# Patient Record
Sex: Female | Born: 1950 | Race: White | Hispanic: No | Marital: Single | Smoking: Former smoker
Health system: Southern US, Community
[De-identification: ages and names within clinical notes are randomized; demographics above are authoritative.]

## PROBLEM LIST (undated history)

## (undated) DIAGNOSIS — J301 Allergic rhinitis due to pollen: Secondary | ICD-10-CM

## (undated) DIAGNOSIS — M2550 Pain in unspecified joint: Secondary | ICD-10-CM

## (undated) DIAGNOSIS — R609 Edema, unspecified: Secondary | ICD-10-CM

## (undated) DIAGNOSIS — M1990 Unspecified osteoarthritis, unspecified site: Secondary | ICD-10-CM

## (undated) DIAGNOSIS — R682 Dry mouth, unspecified: Secondary | ICD-10-CM

## (undated) DIAGNOSIS — M7989 Other specified soft tissue disorders: Secondary | ICD-10-CM

## (undated) DIAGNOSIS — E538 Deficiency of other specified B group vitamins: Secondary | ICD-10-CM

## (undated) DIAGNOSIS — R252 Cramp and spasm: Secondary | ICD-10-CM

## (undated) DIAGNOSIS — I10 Essential (primary) hypertension: Secondary | ICD-10-CM

## (undated) DIAGNOSIS — I82409 Acute embolism and thrombosis of unspecified deep veins of unspecified lower extremity: Secondary | ICD-10-CM

## (undated) DIAGNOSIS — E119 Type 2 diabetes mellitus without complications: Secondary | ICD-10-CM

## (undated) DIAGNOSIS — H43399 Other vitreous opacities, unspecified eye: Secondary | ICD-10-CM

## (undated) DIAGNOSIS — R233 Spontaneous ecchymoses: Secondary | ICD-10-CM

## (undated) DIAGNOSIS — G8929 Other chronic pain: Secondary | ICD-10-CM

## (undated) DIAGNOSIS — F32A Depression, unspecified: Secondary | ICD-10-CM

## (undated) DIAGNOSIS — M545 Low back pain, unspecified: Secondary | ICD-10-CM

## (undated) DIAGNOSIS — Z9889 Other specified postprocedural states: Secondary | ICD-10-CM

## (undated) DIAGNOSIS — R600 Localized edema: Secondary | ICD-10-CM

## (undated) DIAGNOSIS — I639 Cerebral infarction, unspecified: Secondary | ICD-10-CM

## (undated) DIAGNOSIS — G4730 Sleep apnea, unspecified: Secondary | ICD-10-CM

## (undated) DIAGNOSIS — R531 Weakness: Secondary | ICD-10-CM

## (undated) DIAGNOSIS — R51 Headache: Secondary | ICD-10-CM

## (undated) DIAGNOSIS — F329 Major depressive disorder, single episode, unspecified: Secondary | ICD-10-CM

## (undated) DIAGNOSIS — R209 Unspecified disturbances of skin sensation: Secondary | ICD-10-CM

## (undated) DIAGNOSIS — R238 Other skin changes: Secondary | ICD-10-CM

## (undated) DIAGNOSIS — H269 Unspecified cataract: Secondary | ICD-10-CM

## (undated) DIAGNOSIS — M479 Spondylosis, unspecified: Secondary | ICD-10-CM

## (undated) DIAGNOSIS — G459 Transient cerebral ischemic attack, unspecified: Secondary | ICD-10-CM

## (undated) DIAGNOSIS — J189 Pneumonia, unspecified organism: Secondary | ICD-10-CM

## (undated) DIAGNOSIS — C649 Malignant neoplasm of unspecified kidney, except renal pelvis: Secondary | ICD-10-CM

## (undated) DIAGNOSIS — G629 Polyneuropathy, unspecified: Secondary | ICD-10-CM

## (undated) DIAGNOSIS — R011 Cardiac murmur, unspecified: Secondary | ICD-10-CM

## (undated) DIAGNOSIS — G479 Sleep disorder, unspecified: Secondary | ICD-10-CM

## (undated) DIAGNOSIS — H3530 Unspecified macular degeneration: Secondary | ICD-10-CM

## (undated) DIAGNOSIS — E236 Other disorders of pituitary gland: Secondary | ICD-10-CM

## (undated) DIAGNOSIS — M549 Dorsalgia, unspecified: Secondary | ICD-10-CM

## (undated) DIAGNOSIS — K219 Gastro-esophageal reflux disease without esophagitis: Secondary | ICD-10-CM

## (undated) DIAGNOSIS — R002 Palpitations: Secondary | ICD-10-CM

## (undated) DIAGNOSIS — E785 Hyperlipidemia, unspecified: Secondary | ICD-10-CM

## (undated) DIAGNOSIS — R040 Epistaxis: Secondary | ICD-10-CM

## (undated) DIAGNOSIS — R32 Unspecified urinary incontinence: Secondary | ICD-10-CM

## (undated) DIAGNOSIS — D649 Anemia, unspecified: Secondary | ICD-10-CM

## (undated) DIAGNOSIS — I253 Aneurysm of heart: Secondary | ICD-10-CM

## (undated) DIAGNOSIS — R519 Headache, unspecified: Secondary | ICD-10-CM

## (undated) HISTORY — DX: Other specified postprocedural states: Z98.890

## (undated) HISTORY — DX: Pneumonia, unspecified organism: J18.9

## (undated) HISTORY — DX: Allergic rhinitis due to pollen: J30.1

## (undated) HISTORY — PX: BRACHIOPLASTY: SUR162

## (undated) HISTORY — DX: Acute embolism and thrombosis of unspecified deep veins of unspecified lower extremity: I82.409

## (undated) HISTORY — DX: Epistaxis: R04.0

## (undated) HISTORY — PX: FACIAL COSMETIC SURGERY: SHX629

## (undated) HISTORY — PX: CATARACT EXTRACTION W/ INTRAOCULAR LENS  IMPLANT, BILATERAL: SHX1307

## (undated) HISTORY — PX: ESOPHAGOGASTRODUODENOSCOPY: SHX1529

## (undated) HISTORY — DX: Unspecified cataract: H26.9

## (undated) HISTORY — DX: Depression, unspecified: F32.A

## (undated) HISTORY — DX: Type 2 diabetes mellitus without complications: E11.9

## (undated) HISTORY — DX: Palpitations: R00.2

## (undated) HISTORY — DX: Malignant neoplasm of unspecified kidney, except renal pelvis: C64.9

## (undated) HISTORY — DX: Sleep disorder, unspecified: G47.9

## (undated) HISTORY — PX: JOINT REPLACEMENT: SHX530

## (undated) HISTORY — DX: Anemia, unspecified: D64.9

## (undated) HISTORY — PX: VAGINAL HYSTERECTOMY: SUR661

## (undated) HISTORY — DX: Other skin changes: R23.8

## (undated) HISTORY — DX: Other vitreous opacities, unspecified eye: H43.399

## (undated) HISTORY — DX: Weakness: R53.1

## (undated) HISTORY — DX: Aneurysm of heart: I25.3

## (undated) HISTORY — PX: ABDOMINOPLASTY: SUR9

## (undated) HISTORY — DX: Dry mouth, unspecified: R68.2

## (undated) HISTORY — DX: Major depressive disorder, single episode, unspecified: F32.9

## (undated) HISTORY — DX: Deficiency of other specified B group vitamins: E53.8

## (undated) HISTORY — DX: Other disorders of pituitary gland: E23.6

## (undated) HISTORY — DX: Polyneuropathy, unspecified: G62.9

## (undated) HISTORY — DX: Other specified soft tissue disorders: M79.89

## (undated) HISTORY — DX: Cerebral infarction, unspecified: I63.9

## (undated) HISTORY — PX: MASTOPEXY: SUR857

## (undated) HISTORY — DX: Dorsalgia, unspecified: M54.9

## (undated) HISTORY — PX: COLONOSCOPY: SHX174

## (undated) HISTORY — DX: Cramp and spasm: R25.2

## (undated) HISTORY — DX: Unspecified disturbances of skin sensation: R20.9

## (undated) HISTORY — DX: Spontaneous ecchymoses: R23.3

---
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
## 1998-02-06 ENCOUNTER — Ambulatory Visit (HOSPITAL_COMMUNITY): Admission: RE | Admit: 1998-02-06 | Discharge: 1998-02-06 | Payer: Self-pay | Admitting: Internal Medicine

## 1998-07-08 ENCOUNTER — Encounter: Admission: RE | Admit: 1998-07-08 | Discharge: 1998-10-06 | Payer: Self-pay | Admitting: Internal Medicine

## 1999-08-19 ENCOUNTER — Ambulatory Visit (HOSPITAL_COMMUNITY): Admission: RE | Admit: 1999-08-19 | Discharge: 1999-08-19 | Payer: Self-pay | Admitting: Internal Medicine

## 1999-08-19 ENCOUNTER — Encounter: Payer: Self-pay | Admitting: Internal Medicine

## 1999-09-22 HISTORY — PX: TOTAL KNEE ARTHROPLASTY: SHX125

## 1999-12-29 ENCOUNTER — Ambulatory Visit (HOSPITAL_COMMUNITY): Admission: RE | Admit: 1999-12-29 | Discharge: 1999-12-29 | Payer: Self-pay | Admitting: *Deleted

## 1999-12-29 ENCOUNTER — Encounter: Payer: Self-pay | Admitting: *Deleted

## 2000-01-01 ENCOUNTER — Encounter: Payer: Self-pay | Admitting: Orthopedic Surgery

## 2000-01-05 ENCOUNTER — Inpatient Hospital Stay (HOSPITAL_COMMUNITY): Admission: RE | Admit: 2000-01-05 | Discharge: 2000-01-09 | Payer: Self-pay | Admitting: Orthopedic Surgery

## 2000-01-09 ENCOUNTER — Inpatient Hospital Stay (HOSPITAL_COMMUNITY)
Admission: RE | Admit: 2000-01-09 | Discharge: 2000-01-13 | Payer: Self-pay | Admitting: Physical Medicine & Rehabilitation

## 2001-05-06 ENCOUNTER — Ambulatory Visit (HOSPITAL_COMMUNITY): Admission: RE | Admit: 2001-05-06 | Discharge: 2001-05-06 | Payer: Self-pay | Admitting: Internal Medicine

## 2001-05-06 ENCOUNTER — Encounter: Payer: Self-pay | Admitting: Internal Medicine

## 2001-05-11 ENCOUNTER — Encounter: Payer: Self-pay | Admitting: Internal Medicine

## 2001-05-11 ENCOUNTER — Encounter: Admission: RE | Admit: 2001-05-11 | Discharge: 2001-05-11 | Payer: Self-pay | Admitting: Internal Medicine

## 2005-06-05 ENCOUNTER — Ambulatory Visit (HOSPITAL_COMMUNITY): Admission: RE | Admit: 2005-06-05 | Discharge: 2005-06-05 | Payer: Self-pay | Admitting: Family Medicine

## 2005-06-16 ENCOUNTER — Encounter: Admission: RE | Admit: 2005-06-16 | Discharge: 2005-06-16 | Payer: Self-pay | Admitting: Family Medicine

## 2006-06-25 ENCOUNTER — Ambulatory Visit (HOSPITAL_COMMUNITY): Admission: RE | Admit: 2006-06-25 | Discharge: 2006-06-25 | Payer: Self-pay | Admitting: Family Medicine

## 2006-09-21 HISTORY — PX: TOTAL HIP ARTHROPLASTY: SHX124

## 2007-01-30 ENCOUNTER — Encounter: Admission: RE | Admit: 2007-01-30 | Discharge: 2007-01-30 | Payer: Self-pay | Admitting: Sports Medicine

## 2007-02-10 ENCOUNTER — Encounter: Admission: RE | Admit: 2007-02-10 | Discharge: 2007-02-10 | Payer: Self-pay | Admitting: Sports Medicine

## 2007-04-25 ENCOUNTER — Inpatient Hospital Stay (HOSPITAL_COMMUNITY): Admission: RE | Admit: 2007-04-25 | Discharge: 2007-04-29 | Payer: Self-pay | Admitting: Orthopedic Surgery

## 2007-08-01 ENCOUNTER — Ambulatory Visit (HOSPITAL_COMMUNITY): Admission: RE | Admit: 2007-08-01 | Discharge: 2007-08-01 | Payer: Self-pay | Admitting: Family Medicine

## 2009-09-27 ENCOUNTER — Encounter: Admission: RE | Admit: 2009-09-27 | Discharge: 2009-09-27 | Payer: Self-pay | Admitting: Family Medicine

## 2009-10-25 ENCOUNTER — Ambulatory Visit (HOSPITAL_BASED_OUTPATIENT_CLINIC_OR_DEPARTMENT_OTHER): Admission: RE | Admit: 2009-10-25 | Discharge: 2009-10-25 | Payer: Self-pay | Admitting: Family Medicine

## 2009-10-27 ENCOUNTER — Ambulatory Visit: Payer: Self-pay | Admitting: Internal Medicine

## 2009-11-01 ENCOUNTER — Ambulatory Visit (HOSPITAL_COMMUNITY): Admission: RE | Admit: 2009-11-01 | Discharge: 2009-11-01 | Payer: Self-pay | Admitting: Family Medicine

## 2009-11-21 ENCOUNTER — Ambulatory Visit (HOSPITAL_BASED_OUTPATIENT_CLINIC_OR_DEPARTMENT_OTHER): Admission: RE | Admit: 2009-11-21 | Discharge: 2009-11-21 | Payer: Self-pay | Admitting: Family Medicine

## 2009-12-02 ENCOUNTER — Encounter: Admission: RE | Admit: 2009-12-02 | Discharge: 2009-12-02 | Payer: Self-pay | Admitting: Family Medicine

## 2010-03-14 ENCOUNTER — Encounter: Payer: Self-pay | Admitting: Emergency Medicine

## 2010-03-14 ENCOUNTER — Inpatient Hospital Stay (HOSPITAL_COMMUNITY): Admission: AD | Admit: 2010-03-14 | Discharge: 2010-03-15 | Payer: Self-pay | Admitting: Internal Medicine

## 2010-03-14 ENCOUNTER — Ambulatory Visit: Payer: Self-pay | Admitting: Radiology

## 2010-03-14 ENCOUNTER — Ambulatory Visit: Payer: Self-pay | Admitting: Internal Medicine

## 2010-03-17 ENCOUNTER — Telehealth (INDEPENDENT_AMBULATORY_CARE_PROVIDER_SITE_OTHER): Payer: Self-pay | Admitting: *Deleted

## 2010-03-17 ENCOUNTER — Encounter: Payer: Self-pay | Admitting: Cardiovascular Disease

## 2010-03-17 ENCOUNTER — Ambulatory Visit: Payer: Self-pay | Admitting: Internal Medicine

## 2010-03-17 LAB — CONVERTED CEMR LAB
BUN: 18 mg/dL (ref 6–23)
Basophils Relative: 0.7 % (ref 0.0–3.0)
Chloride: 99 meq/L (ref 96–112)
Eosinophils Relative: 2.2 % (ref 0.0–5.0)
HCT: 34.9 % — ABNORMAL LOW (ref 36.0–46.0)
Hemoglobin: 12.1 g/dL (ref 12.0–15.0)
Lymphs Abs: 2.6 10*3/uL (ref 0.7–4.0)
MCV: 81.8 fL (ref 78.0–100.0)
Monocytes Absolute: 0.7 10*3/uL (ref 0.1–1.0)
Neutro Abs: 7.6 10*3/uL (ref 1.4–7.7)
Potassium: 4.2 meq/L (ref 3.5–5.1)
RBC: 4.27 M/uL (ref 3.87–5.11)
WBC: 11.2 10*3/uL — ABNORMAL HIGH (ref 4.5–10.5)

## 2010-03-20 ENCOUNTER — Ambulatory Visit: Payer: Self-pay | Admitting: Cardiovascular Disease

## 2010-03-20 ENCOUNTER — Inpatient Hospital Stay (HOSPITAL_BASED_OUTPATIENT_CLINIC_OR_DEPARTMENT_OTHER): Admission: RE | Admit: 2010-03-20 | Discharge: 2010-03-20 | Payer: Self-pay | Admitting: Internal Medicine

## 2010-05-06 ENCOUNTER — Ambulatory Visit (HOSPITAL_COMMUNITY): Admission: RE | Admit: 2010-05-06 | Discharge: 2010-05-06 | Payer: Self-pay | Admitting: Orthopedic Surgery

## 2010-07-15 ENCOUNTER — Ambulatory Visit (HOSPITAL_COMMUNITY): Admission: RE | Admit: 2010-07-15 | Discharge: 2010-07-15 | Payer: Self-pay | Admitting: General Surgery

## 2010-07-18 ENCOUNTER — Ambulatory Visit (HOSPITAL_COMMUNITY): Admission: RE | Admit: 2010-07-18 | Discharge: 2010-07-18 | Payer: Self-pay | Admitting: General Surgery

## 2010-07-30 ENCOUNTER — Encounter
Admission: RE | Admit: 2010-07-30 | Discharge: 2010-07-30 | Payer: Self-pay | Source: Home / Self Care | Attending: General Surgery | Admitting: General Surgery

## 2010-10-12 ENCOUNTER — Encounter: Payer: Self-pay | Admitting: Family Medicine

## 2010-10-23 NOTE — Letter (Signed)
Summary: Cardiac Catheterization Instructions- [place] [place]  [address] [place]   [place], [place] [address]   Phone: [phone]  Fax: [phone]     03/17/2010 MRN: [id number]  [name] [name], [place]  [address]  Dear [name] [name],   You are scheduled for a Cardiac Catheterization on __6/30/11_______ with Dr.___Cooper___________  Please arrive to the 1st floor of the [facility] at [facility] at _6:30____ am / pm on the day of your procedure. Please do not arrive before 6:30 a.m. Call the [facility] at [phone] if you are unable to make your appointmnet. The Code to get into the parking garage under the building is_0100_______. Take the elevators to the 1st floor. You must have someone to drive you home. Someone must be with you for the first 24 hours after you arrive home. Please wear clothes that are easy to get on and off and wear slip-on shoes. Do not eat or drink after midnight except water with your medications that morning. Bring all your medications and current insurance cards with you.  _x__ DO NOT take these medications before your procedure: _____hold metformin 24 hours before cath and 48 hours after and hold glipizide am of cath___________________________________________________________  _x__ Make sure you take your aspirin.  _x__ You may take ALL of your medications with water that morning. ________________________________________________________________________________________________________________________________  ___ DO NOT take ANY medications before your procedure.  ___ Pre-med instructions:  ________________________________________________________________________________________________________________________________  The usual length of stay after your procedure is 2 to 3 hours. This can vary.  If you have any questions, please call the office at the number  listed above.  DR KLEIN/ Devra Dopp, LPN

## 2010-10-23 NOTE — Progress Notes (Signed)
Summary: need cath please give info to triage nurse  Phone Note Call from Patient Call back at Home Phone [phone]   Caller: Patient Reason for Call: Talk to Nurse Summary of Call: Per after hour voice mail by Dr. Caryl Comes, pt need a catyh please give iinfo to the triage nurse and patient was told if she doesn't hear from [place] by 12 noon to call. Message send to message nurse as urgent message.  Initial call taken by: Neil Crouch,  March 17, 2010 8:23 AM  Follow-up for Phone Call        pt scheduled for l heart cath on thur 03/20/10 at 7:30 with dr cooper per dr Caryl Comes. pt having labs done today and will leave instructions at front desk for pick up.pt aware. Follow-up by: Devra Dopp, LPN,  March 17, 7370 0:62 AM

## 2010-11-06 ENCOUNTER — Encounter: Payer: Federal, State, Local not specified - PPO | Attending: General Surgery

## 2010-11-06 DIAGNOSIS — Z01818 Encounter for other preprocedural examination: Secondary | ICD-10-CM | POA: Insufficient documentation

## 2010-11-06 DIAGNOSIS — Z713 Dietary counseling and surveillance: Secondary | ICD-10-CM | POA: Insufficient documentation

## 2010-11-19 ENCOUNTER — Other Ambulatory Visit: Payer: Self-pay | Admitting: General Surgery

## 2010-11-19 ENCOUNTER — Encounter (HOSPITAL_COMMUNITY): Payer: Federal, State, Local not specified - PPO

## 2010-11-19 LAB — CBC
Hemoglobin: 12.9 g/dL (ref 12.0–15.0)
MCH: 26 pg (ref 26.0–34.0)
MCV: 79.1 fL (ref 78.0–100.0)
RBC: 4.97 MIL/uL (ref 3.87–5.11)

## 2010-11-19 LAB — COMPREHENSIVE METABOLIC PANEL
ALT: 19 U/L (ref 0–35)
CO2: 27 mEq/L (ref 19–32)
Calcium: 9.8 mg/dL (ref 8.4–10.5)
Chloride: 95 mEq/L — ABNORMAL LOW (ref 96–112)
Creatinine, Ser: 1.05 mg/dL (ref 0.4–1.2)
GFR calc non Af Amer: 54 mL/min — ABNORMAL LOW (ref >60)
Glucose, Bld: 135 mg/dL — ABNORMAL HIGH (ref 70–99)
Sodium: 135 mEq/L (ref 135–145)
Total Bilirubin: 0.7 mg/dL (ref 0.3–1.2)

## 2010-11-19 LAB — DIFFERENTIAL
Eosinophils Absolute: 0.2 10*3/uL (ref 0.0–0.7)
Eosinophils Relative: 1 % (ref 0–5)
Lymphocytes Relative: 22 % (ref 12–46)
Lymphs Abs: 3.1 10*3/uL (ref 0.7–4.0)
Monocytes Relative: 5 % (ref 3–12)

## 2010-11-19 LAB — SURGICAL PCR SCREEN: Staphylococcus aureus: NEGATIVE

## 2010-11-24 ENCOUNTER — Inpatient Hospital Stay (HOSPITAL_COMMUNITY)
Admission: RE | Admit: 2010-11-24 | Discharge: 2010-11-27 | DRG: 288 | Disposition: A | Discharge status: Home / Self Care | Payer: Federal, State, Local not specified - PPO | Source: Ambulatory Visit | Attending: General Surgery | Admitting: General Surgery

## 2010-11-24 DIAGNOSIS — E119 Type 2 diabetes mellitus without complications: Secondary | ICD-10-CM | POA: Diagnosis present

## 2010-11-24 DIAGNOSIS — G4733 Obstructive sleep apnea (adult) (pediatric): Secondary | ICD-10-CM | POA: Diagnosis present

## 2010-11-24 DIAGNOSIS — K420 Umbilical hernia with obstruction, without gangrene: Secondary | ICD-10-CM | POA: Diagnosis present

## 2010-11-24 DIAGNOSIS — E7800 Pure hypercholesterolemia, unspecified: Secondary | ICD-10-CM | POA: Diagnosis present

## 2010-11-24 DIAGNOSIS — Z6841 Body Mass Index (BMI) 40.0 and over, adult: Secondary | ICD-10-CM

## 2010-11-24 DIAGNOSIS — Z01812 Encounter for preprocedural laboratory examination: Secondary | ICD-10-CM

## 2010-11-24 DIAGNOSIS — M1990 Unspecified osteoarthritis, unspecified site: Secondary | ICD-10-CM | POA: Diagnosis present

## 2010-11-24 DIAGNOSIS — I10 Essential (primary) hypertension: Secondary | ICD-10-CM | POA: Diagnosis present

## 2010-11-24 DIAGNOSIS — N393 Stress incontinence (female) (male): Secondary | ICD-10-CM | POA: Diagnosis present

## 2010-11-24 HISTORY — PX: ROUX-EN-Y GASTRIC BYPASS: SHX1104

## 2010-11-24 LAB — GLUCOSE, CAPILLARY
Glucose-Capillary: 130 mg/dL — ABNORMAL HIGH (ref 70–99)
Glucose-Capillary: 173 mg/dL — ABNORMAL HIGH (ref 70–99)
Glucose-Capillary: 232 mg/dL — ABNORMAL HIGH (ref 70–99)

## 2010-11-24 LAB — HEMOGLOBIN AND HEMATOCRIT, BLOOD
HCT: 33.1 % — ABNORMAL LOW (ref 36.0–46.0)
Hemoglobin: 10.1 g/dL — ABNORMAL LOW (ref 12.0–15.0)

## 2010-11-25 ENCOUNTER — Inpatient Hospital Stay (HOSPITAL_COMMUNITY): Payer: Federal, State, Local not specified - PPO

## 2010-11-25 DIAGNOSIS — Z09 Encounter for follow-up examination after completed treatment for conditions other than malignant neoplasm: Secondary | ICD-10-CM

## 2010-11-25 LAB — DIFFERENTIAL
Basophils Relative: 0 % (ref 0–1)
Eosinophils Absolute: 0 10*3/uL (ref 0.0–0.7)
Lymphs Abs: 0.9 10*3/uL (ref 0.7–4.0)
Neutro Abs: 11.1 10*3/uL — ABNORMAL HIGH (ref 1.7–7.7)
Neutrophils Relative %: 86 % — ABNORMAL HIGH (ref 43–77)

## 2010-11-25 LAB — GLUCOSE, CAPILLARY
Glucose-Capillary: 165 mg/dL — ABNORMAL HIGH (ref 70–99)
Glucose-Capillary: 162 mg/dL — ABNORMAL HIGH (ref 70–99)
Glucose-Capillary: 151 mg/dL — ABNORMAL HIGH (ref 70–99)

## 2010-11-25 LAB — HEMOGLOBIN AND HEMATOCRIT, BLOOD
HCT: 34.5 % — ABNORMAL LOW (ref 36.0–46.0)
Hemoglobin: 10.7 g/dL — ABNORMAL LOW (ref 12.0–15.0)

## 2010-11-25 LAB — CBC
Hemoglobin: 11.1 g/dL — ABNORMAL LOW (ref 12.0–15.0)
Platelets: 241 10*3/uL (ref 150–400)
RBC: 4.27 MIL/uL (ref 3.87–5.11)
WBC: 12.8 10*3/uL — ABNORMAL HIGH (ref 4.0–10.5)

## 2010-11-26 LAB — CBC
HCT: 34.5 % — ABNORMAL LOW (ref 36.0–46.0)
Hemoglobin: 10.6 g/dL — ABNORMAL LOW (ref 12.0–15.0)
MCV: 83.9 fL (ref 78.0–100.0)
Platelets: 223 10*3/uL (ref 150–400)
RBC: 4.11 MIL/uL (ref 3.87–5.11)
WBC: 13.2 10*3/uL — ABNORMAL HIGH (ref 4.0–10.5)

## 2010-11-26 LAB — GLUCOSE, CAPILLARY
Glucose-Capillary: 132 mg/dL — ABNORMAL HIGH (ref 70–99)
Glucose-Capillary: 116 mg/dL — ABNORMAL HIGH (ref 70–99)

## 2010-11-26 LAB — DIFFERENTIAL
Lymphocytes Relative: 7 % — ABNORMAL LOW (ref 12–46)
Lymphs Abs: 1 10*3/uL (ref 0.7–4.0)
Neutro Abs: 11.4 10*3/uL — ABNORMAL HIGH (ref 1.7–7.7)
Neutrophils Relative %: 86 % — ABNORMAL HIGH (ref 43–77)

## 2010-11-28 ENCOUNTER — Other Ambulatory Visit (HOSPITAL_COMMUNITY): Payer: Self-pay | Admitting: Family Medicine

## 2010-11-28 DIAGNOSIS — Z1231 Encounter for screening mammogram for malignant neoplasm of breast: Secondary | ICD-10-CM

## 2010-11-30 NOTE — Discharge Summary (Signed)
[name], [name]              ACCOUNT NO.:  [id number]  MEDICAL RECORD NO.:  [id number]           PATIENT TYPE:  I  LOCATION:  1526                         FACILITY:  [facility]  PHYSICIAN:  Leighton Ruff. Redmond Pulling, MD     DATE OF BIRTH:  Apr 19, 1951  DATE OF ADMISSION:  11/24/2010 DATE OF DISCHARGE:  11/27/2010                              DISCHARGE SUMMARY   ADMITTING DIAGNOSES: 1. Morbid obesity. 2. Umbilical hernia. 3. Hypertension. 4. Diabetes mellitus type 2. 5. Hypercholesterolemia. 6. Osteoarthritis. 7. Carpal tunnel syndrome.  DISCHARGE DIAGNOSES: 1. Morbid obesity. 2. Umbilical hernia. 3. Hypertension. 4. Diabetes mellitus type 2. 5. Hypercholesterolemia. 6. Osteoarthritis. 7. Carpal tunnel syndrome.  PROCEDURES:  November 24, 2010, laparoscopic Roux-en- Y gastric bypass, umbilical hernia repair by Dr. Excell Seltzer.  BRIEF HOSPITAL COURSE:  The patient was admitted to the hospital on March 5 and underwent the above-mentioned procedure.  Her postoperative course was unremarkable.  Please see operative report for further details.  On postoperative day #1, she underwent upper GI which demonstrated patency of the gastrojejunal anastomosis as well as no leak.  She was therefore started on ice chips and water.  She also had a duplex ultrasound of her lower extremities which demonstrated no evidence of deep venous thrombosis.  She was maintained on chemical DVT prophylaxis consisting of subcutaneous heparin.  She was not given any of her antihypertensive medications while in the hospital.  Her blood sugars were monitored.  Her blood sugars ranged from the low 100s to the 140s.  She did require some supplemental insulin.  Her hemoglobin remained stable.  On postoperative day #3, she was tolerating bariatric shakes.  Her vital signs were stable.  Her wounds were clean, dry and intact.  She was deemed stable for discharge.  DISCHARGE MEDICATIONS: 1. Roxicet elixir 5 to 10 mL every 4  hours as needed for pain. 2. Amitriptyline 10 mg q.p.m.  I stopped all her home medications including her metformin, glipizide, benazepril/HCTZ and Lasix for now.  DISCHARGE PLAN:  She is going to follow up with one of our nurses in the office in about 10 days for staple removal.  She will see Dr. Excell Seltzer on March 22.  She is going to see Cherre Blanc, our nutritionist, on March 20.  I have also recommended that she follow with her primary care physician, Dr. Brion Aliment, within 1 to 2 weeks to monitor her blood pressure and her diabetes and to determine whether or not she needs to have medication reinstituted.  DISCHARGE INSTRUCTIONS:  The patient has been instructed to keep up journal of her blood sugars and to check them about once or twice a day. She has been instructed to call her primary care doctor should her blood sugars get above 200.  She was given discharge instructions regarding gastric bypass diet.  She can shower.  She is not to do any lifting for 4 weeks and she is to increase her activity slowly.Leighton Ruff. Redmond Pulling, MD     EMW/MEDQ  D:  11/27/2010  T:  11/27/2010  Job:  [id number]  cc:   Brion Aliment, M.D.  Amy  Boykin Peek. [facility]  Electronically Signed by Greer Pickerel M.D. on 11/30/2010 09:45:43 AM

## 2010-12-02 NOTE — Op Note (Signed)
NAMEMARYAN, [name]              ACCOUNT NO.:  [id number]  MEDICAL RECORD NO.:  [id number]           PATIENT TYPE:  I  LOCATION:  0004                         FACILITY:  [facility]  PHYSICIAN:  [name] Kitchen T. Lourdez [name], M.D.DATE OF BIRTH:  15-Sep-1951  DATE OF PROCEDURE:  11/24/2010 DATE OF DISCHARGE:                              OPERATIVE REPORT   PREOPERATIVE DIAGNOSES: 1. Morbid obesity. 2. Umbilical hernia.  POSTOPERATIVE DIAGNOSES: 1. Morbid obesity. 2. Umbilical hernia.  SURGICAL PROCEDURES: 1. Laparoscopic Roux-en-Y gastric bypass. 2. Umbilical hernia repair.  SURGEON:  [name] [name], M.D.  ASSISTANT:  [name] Caprice. Hassell Done, MD  ANESTHESIA:  General.  BRIEF HISTORY:  [name] [name] is a 60 year old female who presents with progressive morbid obesity unresponsive to multiple attempts at medical management.  She presents at 5 feet 4, 313 pounds, BMI 53.5.  She has multiple comorbidities including adult-onset diabetes mellitus, obstructive sleep apnea, hypertension, hypercholesterolemia, arthritis, and stress urinary incontinence.  After extensive preoperative discussion and workup detailed elsewhere, we have elected to proceed with laparoscopic Roux-en-Y gastric bypass for treatment of her morbid obesity.  The patient also has a small umbilical hernia that we discussed would possibly need to be repaired to prevent postoperative small bowel incarceration.  The patient has undergone a clinical bowel prep at home.  DESCRIPTION OF PROCEDURE:  She was brought to the operating room, placed in supine position on the operative table and general orotracheal anesthesia was induced.  She received preoperative IV antibiotics. Heparin 5000 units was given subcutaneously preoperatively.  Foley catheter was placed.  The abdomen was widely sterilely prepped and draped.  Correct patient and procedure were verified.  Access was obtained in the left upper quadrant with an 11-mm  OptiView trocar without difficulty and pneumoperitoneum established.  Under direct vision, a 12-mm trocar was placed laterally in the right upper quadrant and the 12 mm trocar in right upper quadrant midclavicular line and an 11-mm trocar just above and left the umbilicus for camera port. Finally, a 5-mm trocar was placed in the left lateral abdomen.  There was an approximately 1-cm umbilical hernia with some chronically incarcerated omentum and this was reduced from the hernia.  The omentum was elevated, the transverse mesocolon elevated, and the ligament Treitz clearly identified.  A 40-cm biliopancreatic limb was then carefully measured and divided at this point with a single firing of the white load 60-mm stapler.  The mesentery was further mobilized the short distance with a Harmonic scalpel.  The end of the Roux limb was marked by suturing a [name] [name] to it.  Following this, a 100-cm Roux limb was carefully measured.  At this point, a side-to-side anastomosis was created between the biliopancreatic limb and the Roux limb with a single firing of the Endo GIA white load 45-mm stapler.  The staple line was intact without bleeding.  The common enterotomy was closed from either end with running 2-0 Vicryl.  The mesenteric defect was closed with a running 2-0 Vicryl.  Following this, the omentum was divided with Harmonic scalpel just left of the falciform ligament to allow increased mobility of the Roux limb in the  antecolic fashion.  A [name] [name] was then placed through a 5-mm subxiphoid site.  The liver was somewhat fatty and enlarged but not extreme.  [name] [name] was used to elevate the left lobe liver with very good exposure of the hiatus and upper stomach.  The angle of His was initially mobilized dividing the peritoneal attachments with a Harmonic scalpel.  Following this, a 4 to 5 cm gastric pouch was carefully measured along the lesser curve.  The peritoneum here  was incised and dissection was carried towards the retrogastric space and lesser sac with a Harmonic scalpel at right angles to the lesser curve and the lesser sac was able to be entered without difficulty.  Made sure all tubes were removed from the stomach. Initial firing of the gold Echelon staple was performed on the right angles to the lesser curve for about 3 to 4 cm.  Following this, the pouch was created with 2 further firings of the blue load 60-mm stapler up through the previously dissected area at angle of His.  This created a nice tubular pouch of 4 to 5 cm in length.  At this point, the Roux limb was brought up in an antecolic fashion with the end of the Roux limb candy cane facing to the patient's left under no unusual tension. Initial posterior row of running 2-0 Vicryl was placed between the Roux limb of the gastric pouch.  With the [name] [name] passed back down into the stomach, enterotomies were made in the gastric pouch and the Roux limb with Harmonic scalpel and approximately 2.5 cm anastomosis created with a single firing of the blue load 45-mm linear stapler.  Staple line was intact without bleeding.  Common enterotomy was then closed from either end with running 2-0 Vicryl.  The [name] [name] was then passed back down through the anastomosis of the stent and an outer seromuscular layer of running 2-0 Vicryl with an [name] [name] was placed.  The [name] [name] was then removed.  The [name] [name] defect was exposed.  This was closed with a running 2-0 silk between the transverse mesocolon and the Roux limb mesentery.  Then, I felt that we needed to close the umbilical defect to prevent small bowel incarceration.  A small less than 1 cm incision was made just beneath the umbilicus and using the EndoClose device with 0 Prolene sutures, 2 sutures were placed a transversely oriented closing the fascial and peritoneal defect.  At this point, Dr. Hassell [name] went above to perform endoscopy.  With  the outlet of the pouch clamped and under saline irrigation with the pouch tightly distended with air, there was no evidence of leak.  The pouch was desufflated. The abdomen was irrigated and hemostasis assured.  There was noted to be a small capsular tear of the undersurface of left lobe of liver, probably from one of the instruments as liver was quite large.  This was not bleeding.  The suture staple lines of the gastrojejunostomy was repaired with Tisseel tissue sealant.  A piece of Surgicel was placed over the small liver capsular tear and there was no bleeding.  The Nathanson [name] was removed.  Two On-Q catheters were placed on either side of the abdomen for postoperative infusion.  All CO2 was then evacuated.  Trocars were removed.  Skin incisions were closed with staples.  Sponges and instrument counts correct.  The patient was taken to recovery in good condition.     [name] Lamer. Seana Underwood, M.D.     [place]  D:  11/24/2010  T:  11/24/2010  Job:  [id number]  Electronically Signed by Excell Seltzer M.D. on 12/02/2010 07:47:50 PM

## 2010-12-07 LAB — BASIC METABOLIC PANEL
BUN: 18 mg/dL (ref 6–23)
CO2: 30 mEq/L (ref 19–32)
Glucose, Bld: 200 mg/dL — ABNORMAL HIGH (ref 70–99)
Potassium: 3.8 mEq/L (ref 3.5–5.1)
Sodium: 138 mEq/L (ref 135–145)

## 2010-12-07 LAB — CARDIAC PANEL(CRET KIN+CKTOT+MB+TROPI)
CK, MB: 1.6 ng/mL (ref 0.3–4.0)
CK, MB: 1.9 ng/mL (ref 0.3–4.0)
Total CK: 57 U/L (ref 7–177)
Total CK: 58 U/L (ref 7–177)
Troponin I: 0.01 ng/mL (ref 0.00–0.06)
Troponin I: 0.02 ng/mL (ref 0.00–0.06)

## 2010-12-07 LAB — CBC
HCT: 38.7 % (ref 36.0–46.0)
Hemoglobin: 13.1 g/dL (ref 12.0–15.0)
MCH: 27.4 pg (ref 26.0–34.0)
MCHC: 33.8 g/dL (ref 30.0–36.0)
RDW: 13.1 % (ref 11.5–15.5)

## 2010-12-07 LAB — DIFFERENTIAL
Basophils Relative: 2 % — ABNORMAL HIGH (ref 0–1)
Eosinophils Absolute: 0.2 10*3/uL (ref 0.0–0.7)
Eosinophils Relative: 2 % (ref 0–5)
Monocytes Absolute: 0.7 10*3/uL (ref 0.1–1.0)
Monocytes Relative: 7 % (ref 3–12)

## 2010-12-07 LAB — LIPID PANEL
Cholesterol: 180 mg/dL (ref 0–200)
LDL Cholesterol: UNDETERMINED mg/dL (ref 0–99)
Total CHOL/HDL Ratio: 4.7 RATIO
Triglycerides: 429 mg/dL — ABNORMAL HIGH (ref <150)
VLDL: UNDETERMINED mg/dL (ref 0–40)

## 2010-12-07 LAB — HEMOGLOBIN A1C: Mean Plasma Glucose: 174 mg/dL — ABNORMAL HIGH (ref <117)

## 2010-12-07 LAB — GLUCOSE, CAPILLARY
Glucose-Capillary: 206 mg/dL — ABNORMAL HIGH (ref 70–99)
Glucose-Capillary: 331 mg/dL — ABNORMAL HIGH (ref 70–99)

## 2010-12-07 LAB — TSH: TSH: 3.275 u[IU]/mL (ref 0.350–4.500)

## 2010-12-09 ENCOUNTER — Encounter: Payer: Federal, State, Local not specified - PPO | Attending: General Surgery

## 2010-12-09 DIAGNOSIS — Z09 Encounter for follow-up examination after completed treatment for conditions other than malignant neoplasm: Secondary | ICD-10-CM | POA: Insufficient documentation

## 2010-12-09 DIAGNOSIS — Z713 Dietary counseling and surveillance: Secondary | ICD-10-CM | POA: Insufficient documentation

## 2010-12-09 DIAGNOSIS — Z9884 Bariatric surgery status: Secondary | ICD-10-CM | POA: Insufficient documentation

## 2010-12-10 ENCOUNTER — Ambulatory Visit (HOSPITAL_COMMUNITY)
Admission: RE | Admit: 2010-12-10 | Discharge: 2010-12-10 | Disposition: A | Discharge status: Home / Self Care | Payer: Federal, State, Local not specified - PPO | Source: Ambulatory Visit | Attending: Family Medicine | Admitting: Family Medicine

## 2010-12-10 DIAGNOSIS — Z1231 Encounter for screening mammogram for malignant neoplasm of breast: Secondary | ICD-10-CM | POA: Insufficient documentation

## 2011-01-21 ENCOUNTER — Encounter: Payer: Federal, State, Local not specified - PPO | Attending: General Surgery | Admitting: *Deleted

## 2011-01-21 DIAGNOSIS — Z01818 Encounter for other preprocedural examination: Secondary | ICD-10-CM | POA: Insufficient documentation

## 2011-01-21 DIAGNOSIS — Z713 Dietary counseling and surveillance: Secondary | ICD-10-CM | POA: Insufficient documentation

## 2011-02-03 NOTE — Op Note (Signed)
[name] [name], [name] [name]              ACCOUNT NO.:  [id number]   MEDICAL RECORD NO.:  [id number]          PATIENT TYPE:  INP   LOCATION:  2550                         FACILITY:  [facility]   PHYSICIAN:  Kathalene Frames. Mayer Camel, M.D.   DATE OF BIRTH:  January 16, 1951   DATE OF PROCEDURE:  04/25/2007  DATE OF DISCHARGE:                               OPERATIVE REPORT   PREOPERATIVE DIAGNOSES:  1. Morbid obesity.  2. End-stage arthritis, right hip.   POSTOPERATIVE DIAGNOSES:  1. Morbid obesity.  2. End-stage arthritis, right hip.   PROCEDURE:  Complex right total hip arthroplasty using a DePuy 50 mm ASR  cup, 18 x 13 x 42 x 150 SROM stem, 18-B small cone, NK +0 45-mm Ultramet  ball.   SURGEON:  Kathalene Frames.  Mayer Camel, MD   ASSISTANT:  Lowell Guitar. Mercie Eon.   ANESTHETIC:  General endotracheal.   ESTIMATED BLOOD LOSS:  400 mL.   FLUID REPLACEMENT:  1500 mL crystalloid, 1 unit of Hespan.   DRAINS PLACED:  Foley catheter.   URINE OUTPUT:  300 mL.   INDICATIONS FOR PROCEDURE:  The patient has previously had two total  knee arthroplasties and now has end-stage arthritis of her right hip.  She is only 60 years old but she is doing quite well with her total  knees.  She weighs about 310 pounds, is 5 feet 2 inches tall, making  this a fairly complex procedure, as her total knees were; however, she  has done well with the total knees.  She has end-stage arthritis by x-  ray of the right hip and bone-on-bone arthritic changes.  Has failed  conservative treatment with observation, anti-inflammatory medicines and  judicious use of narcotics.  She desires elective right total hip  arthroplasty.  Risks and benefits of surgery discussed, questions  answered.   DESCRIPTION OF PROCEDURE:  The patient identified by armband, taken to  the operating room at [facility].  Appropriate anesthetic  monitors were attached and general endotracheal anesthesia induced with  the patient in supine position.  A Foley  catheter was inserted.  She  received a gram of vancomycin and was then rolled into the left lateral  decubitus position and fixed there with a Silver Mark II pelvic clamp  and the right lower extremity prepped and draped in the usual sterile  fashion from the ankle to the hemipelvis.  The skin along the lateral  hip and thigh was infiltrated with 25 mL of 0.25% Marcaine and  epinephrine solution and we began the procedure by making a  posterolateral approach to the hip joint centered over the greater  trochanter with about a 28-cm incision, cutting through the skin and  into the subcutaneous tissue.  Small bleeders were identified and  cauterized.  Large Adson-Beckman retractors were used to move the fat  wall out of the way.  After going down about 5 or 6 6 inches, we  encountered the IT band.  This was cut in line with the skin incision  exposing the greater trochanter.  Cobra retractors were placed between  the gluteus minimus  superiorly and the hip joint capsule and inferiorly  between quadratus femoris and the hip joint capsule.  This isolated the  piriformis and short external rotators, which were cut off at their  insertion on the intertrochanteric crest and tagged with a 2-0 Ethibond  suture.  This exposed the capsule, which was developed into an  acetabular-based posterior flap likewise tagged with two #2-0 Ethibond  sutures, exposing the arthritic femoral head, which was then dislocated  with flexion and internal rotation and a standard neck cut performed one  fingerbreadth above the lesser trochanter.  The proximal femur was then  translated anteriorly with a Hohmann retractor levering off the anterior  column.  A posterior wing retractor was placed at the junction of the  ischium and the acetabulum, an inferior spike Cobra just below the  cotyloid notch.  This allowed removal of the labrum with the extended  Bovie.  We then reamed the acetabulum up to a 49-mm basket reamer   obtaining good coverage in all quadrants, just getting into the  subchondral bone.  Satisfied with this, we then hammered into place a 50-  mm ASR cup in 45 degrees of abduction and 20 degrees of anteversion,  obtaining  good, firm fit and fill.  The hip was then flexed and  internally rotated exposing the proximal femur, which was entered with a  cylindrical chisel followed by the initiating reamer and then  cylindrical reaming up to a 13.5 mm cylindrical reamer.  We actually  encountered chatter at 11 mm but because of her body habitus and her  thick cortical bone, it was felt that a 13-mm stem would be the best for  her.  We then conically reamed up to 13-B cone to accept a 42 neck, and  the calcar was milled to a small calcar.  The trial was then assembled  with a 13-B small trial cone, a 13 stem with a 42 base neck and a 45 NK  +0 head.  The hip was reduced.  The version was same as the calcar.  The  hip could not be dislocated anteriorly in extension, and in flexion you  could flex her to 90 degrees with 70 or 80 degrees of internal rotation,  and there was no instability noted.  At this point the trial components  were removed a real 18-B small ZTT1 cone was then hammered into place,  followed by the 18 x 13 x 42 stem that was 150 in length.  This was  hammered into place in the same version as the calcar.  Once this was  seated, an +0 45-mm Ultramet ball was hammered onto the stem and the hip  again reduced.  Stability was noted to be excellent.  The short external  rotators and capsular flap were repaired back to the intertrochanteric  crest through drill holes.  The wound was once again irrigated out with  normal saline solution and then a layered closure of #1 Vicryl in the IT  band, 0 Vicryl and then two more layers of 2-0 Vicryl in the  subcutaneous tissue and running interlocking 3-0 nylon on the skin  followed by a Mepilex dressing were applied.  The patient was then   unclamped, rolled supine, awakened and taken to the recovery room  without difficulty.      Kathalene Frames. Mayer Camel, M.D.  Electronically Signed     FJR/MEDQ  D:  04/25/2007  T:  04/25/2007  Job:  [id number]

## 2011-02-06 NOTE — Discharge Summary (Signed)
[facility]  Patient:    [name] [name], [name] [name]                     MRN: [id number] Adm. Date:  [id number] Disc. Date: [id number] Attending:  Alger Simons T Dictator:   Sena Hitch. Idacavage, P.A.C. CC:         Alger Simons, M.D.             Leilani Merl, M.D.             Debroah Baller, M.D.                           Discharge Summary  DIAGNOSES: 1. Status post bilateral total knee arthroplasty. 2. Degenerative joint disease. 3. Hypertension. 4. Diabetes. 5. Gastroesophageal reflux disease. 6. Overactive bladder. 7. Coumadin anticoagulation.  HOSPITAL COURSE:  The patient is a 60 year old female with end-stage DJD who elected to undergo bilateral total knee arthroplasty on January 05, 2000 by Dr. Debroah Baller.  CPM was ordered for range of motion.  The patient was allowed weightbearing as tolerated.  She was placed on Coumadin for DVT prophylaxis.  She did have some calf pain.  A lower extremity doppler was negative for DVTs. It was felt that due to the fact that the patient lives alone in one-level house with one step to enter that she would require inpatient rehabilitation prior to returning home.  She was admitted to the rehabilitation facility on the January 09, 2000 where she has participated in physical and occupational therapies.  She continued to have urinary frequency and was placed on Ditropan.  She did have results from this.  She has had no other significant medical problems and at the current time is considered to e independent enough to be discharged to home.  DISCHARGE MEDICATIONS: 1. Coumadin 5 mg q.d. or as directed until Feb 02, 2000. 2. Ditropan XL 5 mg at bedtime. 3. Oxycontin CR 10 mg q.12h. for 1 week. 4. Percocet 1-2 q.4h. p.r.n. 5. Pepcid 20 mg at bed time while on Coumadin. 6. Lotensin/HCTZ 20/12.5 mg q.d. 7. Nicobid 500 mg q.d.  ACTIVITY:  Weightbearing as tolerated.  DIET:  She is instructed  to follow a diabetic diet.  WOUND CARE:  Keep her wound clean and dry.  DISCHARGE INSTRUCTIONS:  She will have home health therapy as well as a nurse to draw her PT and INR on January 19, 2000 with results to Dr. Leilani Merl. She will be instructed to follow up with Dr. Debroah Baller in one week.  CONDITION ON DISCHARGE:  Stable. DD:  01/13/00 TD:  01/13/00 Job: [id number]

## 2011-02-06 NOTE — Op Note (Signed)
[place]. [facility]  Patient:    [name] [name], [name] [name]                       MRN: [id number] Proc. Date: 01/05/00 Attending:  Debroah Baller, M.D.                           Operative Report  PREOPERATIVE DIAGNOSIS:  Degenerative arthritis bilateral knees with 5 to 10 degrees varus deformity and 15 degrees flexion contractures bilaterally in a woman who is about 5 feet 3 inches tall and weighs in excess of 250 pounds.  She is 60 years old and has failed conservative treatment.  POSTOPERATIVE DIAGNOSIS:  Degenerative arthritis bilateral knees with 5 to 10 degrees varus deformity and 15 degrees flexion contractures bilaterally in a woman who is about 5 feet 3 inches tall and weighs in excess of 250 pounds.  She is 60 years old and has failed conservative treatment.  OPERATION:  Right total knee arthroplasty.  This is actually the first part of  bilateral total knee procedure.  SURGEON:  Debroah Baller, M.D.  ASSISTANTS: 1. Alta Corning, M.D. 2. Liliane Channel, P.A.-C.  ANESTHESIA:  General endotracheal anesthesia.  ESTIMATED BLOOD LOSS:  Minimal.  FLUIDS REPLACED:  1500 cc of crystalloid.  URINE OUTPUT:  500 cc.  INDICATION:  A 60 year old woman who is morbidly obese with end-stage arthritis of both knees with 10 degree varus deformities, 15 degrees flexion contractures bilaterally, bone-on-bone arthritic changes.  She has failed conservative treatment with anti-inflammatory medicines.  Attempts at weight loss that have failed, physical therapy, intra-articular Cortisone injection, hyalogen injections as well, and all of this has failed.  Radiographs currently show end-stage arthritis, bony erosions of the medial compartments bilaterally, grade 3 pear bank changes laterally and in the patella femoral joint.  She desires right and left total knee arthroplasty if anesthesia permits.  DESCRIPTION OF PROCEDURE:  The patient  identified by arm band, taken to the operating room at [facility] main hospital.  Appropriate anesthetic monitors were attached and general endotracheal anesthesia induced with the patient in the supine position.  Foley catheter was placed.  Both lower extremities were then prepped and draped in the usual sterile fashion from the ankle to the mid-thigh where Y tourniquets have been placed and a Sure-Fit was placed on the right side to allow control of knee flexion.  The right lower extremity was then wrapped with an Esmarch bandage and tourniquet inflated to 350 mmHg.  Total tourniquet time on the right side was about an 1 hour and 45 minutes.  We began the procedure by making an anterior midline incision starting 2 to 3 cm distal to the tibial tubercle on the medial side, going up over the patella, and proximally for a distance of 8 to 10 cm.  We cut down through the skin of the subcutaneous tissue, down to the transverse retinaculum over the patella, and using the #10 blade made a medial parapatellar arthrotomy.  This was extended down to the medial side of the anterior aspect of the tibia and we cut down into the periosteum to about a 1 cm distal and 2 cm medial to the tibial tubercle allowing [place] to elevate and peel the superficial medial collateral ligaments from anterior to posterior and rotate the tibia out from underneath the femur.  The ACL and the CL were resected along with the medial and lateral menisci  and the tibial spines were the cut off with the power saw.  The patella was everted.  The knee hyperflexed.  Z retractor placed medially and a McHale retractor posteriorly subluxing the tibia from out beneath the femur. The proximal tibia was then entered with the Osteonic step drill and a 0 degree flow cutting guide was placed using the intramedullary rod guides.  The setup was that we cut about 2 mm of bone medially and 7 or 8 mm of bone laterally secondary to the  erosions that she had from her arthritis.  Posterior  structures were protected with Z retractors and Hohmann retractor as well as the McHale, and with the cutting guide in place, the proximal tibial cut was accomplished.  Sclerotic bone was noted medially which was drilled and medial osteophytes were also removed from the tibia and the femur.  The distal femur was then entered with the Osteonic stepdrill 2 mm anterior to he PCL origin with a 5 degree right 10 mm Osteonic cutting guide pinned in place allowing removal of the distal 10 mm of the lateral femoral condyle and a small  amount of the medial femoral condyle.  We then sized for a #9 femoral component  which was then pinned in 3 degrees of external rotation to allow for the tightness medially.  Using a lobster claw, we checked to make sure that we did not taken any excess one anteriorly and the anterior and posterior cuts were then accomplished followed y the chamfer cuts.  Large posterior osteophytes were then removed with the knee hyperflexed and using the power saw and protecting the posterior structures with a Cobb elevator.  Once we had accomplished, the medial contracture had been released greatly and he femur was ready for the Scorpio box cut with the cutting guide pinned in place. We then placed a trial #9 right femoral component, a #7 tibial base plate with a 10 mm spacer, and took the knee through a range of motion and noticed excellent stability.  She was somewhat tight laterally requiring about a 75% recession of the popliteus tendon off the femur.  The everted patella was then grasped with the Osteonic cutting guide and the posterior 9 mm of patella resected, sized for a 7, and modified medial patellar button and sclerotic bone was drilled.  At this point, the trial components were removed.  All bony surfaces were waterpicked clean, dried, suctioned, and sponges.  A single batch of methyl methacrylate  cement for Palicose was mixed with 750 mg of Zinacef followed by the motimer, and after mixing for two solid minutes was applied to bony and metallic surfaces.  In order, we hammered into place a #7 tibial base plate, a #9 right  Scorpio femoral component, and a #7 modified medial patellar button.  Excess cement was removed and freer elevators.  The knee was reduced and held in 5 degrees of  flexion and compression applied.  Once again, we waterpicked clean the knee.  Medium Hemovac drains were placed deep in the wound.  The medial parapatellar arthrotomy was then closed with running 1 Vicryl suture.  The subcutaneous tissue with 0 and 2-0 undyed Vicryl suture, and the drains placed to suction.  A dressing of Xerofoam, 4 by 4s dressing, sponges, Webril, and Ace wrap applied.  We then directed our attention to the left knee, where Dr. Alta Corning had already started, and I went on to assist him with the implantation of the components and closure on  the left knee. DD:  01/05/00 TD:  01/05/00 Job: [id number]

## 2011-02-06 NOTE — Discharge Summary (Signed)
[place]. [facility]  Patient:    [name] [name], [name] [name]                     MRN: [id number] Adm. Date:  [id number] Disc. Date: [id number] Attending:  Alger Simons T Dictator:   Liliane Channel, P.A.-C.                           Discharge Summary  PRIMARY DIAGNOSIS: End-stage degenerative joint disease of bilateral knees.  OPERATION/PROCEDURE: Bilateral total knee arthroplasty.  HISTORY OF PRESENT ILLNESS: The patient is a 60 year old female with a two year history of increasing pain in both knees, with DJD by x-ray, history, and examination.  The patient has failed conservative treatments including nonsteroidal anti-inflammatories, physical therapy, hyalogen injections, steroid injections.  She now wishes to proceed with bilateral total knee arthroplasty after discussing the risks versus benefits of the procedure.  ALLERGIES:  1. CODEINE, which causes vomiting.  2. PENICILLIN, which causes itching and rash.  ADMISSION MEDICATIONS:  1. Glucophage 500 mg bid  2. Hydrocodone 500 mg p.r.n.  3. Celebrex 10 mg b.i.d.  4. Lotensin HCT 20/10.5 q.d.  5. Niaspan 500 mg q.d.  PRIMARY CARE PHYSICIAN: Leilani Merl, M.D.  PAST MEDICAL HISTORY:  1. Usual childhood diseases.  2. Hypertension.  3. Non-insulin dependent diabetes.  4. DJD.  PAST SURGICAL HISTORY:  1. Tonsillectomy in 1958.  2. Hysterectomy in 1994.  No difficulties with general endotracheal anesthesia.  SOCIAL HISTORY: No IV drug abuse.  Three-fourths pack per day tobacco x 20 years.  Occasional alcohol.  She lives alone.  FAMILY HISTORY: Mother alive at age 67 with no known health problems.  Father alive at age 71 with history of DJD and cancer.  REVIEW OF SYSTEMS: Positive for glasses, cough, and shortness of breath - especially with exertion, and abnormal heart beat.  Positive for headache.  PHYSICAL EXAMINATION:  VITAL SIGNS: Height 5 feet 4-1/2 inches.  Weight 313 [name] [name].   Blood pressure 126/86.  HEENT: Head normocephalic, atraumatic.  TMs clear.  PERRLA.  Nose clear. Throat benign.  NECK: Supple with full range of motion.  CHEST: Clear to auscultation and percussion.  BREAST: Examination not performed.  HEART: Regular rate and rhythm with no murmurs, rubs, or gallops.  The patient is scheduled to undergo a cardiac stress test prior to her surgery by order of her primary M.D.  ABDOMEN: Soft, nontender, obese.  GU: Examination not performed.  EXTREMITIES: Bilateral knee examination shows 5-10 degrees flexion contracture with positive crepitus and tenderness to all ranges of motion.  Range of motion 0-100 degrees bilaterally, otherwise neurovascularly intact.  SKIN: Within normal limits.  LABORATORY DATA: X-ray show bone-on-bone arthritis with erosions medially.  Laboratories including CBC, CMET, chest x-ray, EKG, PT, and PTT were obtained and were all within normal limits with the exception of WBC of 13.8, hematocrit 34.4, glucose 162.  HOSPITAL COURSE: On the day of admission the patient was taken to the operating room and underwent bilateral total knee arthroplasty utilizing the following components.  On the right a #7 tibia, #7 patella, #9 femur, 10 mm tibial bearing insert; all components cemented.  On the left side a #7 tibia, #9 femur, #7 patella, and 10 mm tibial bearing were used; again, all components cemented.  The patient was placed on antibiotics for the first 48 hours and was placed on postoperative Coumadin prophylaxis for target INR of 1.5-2 per pharmacy  protocol.  She was placed on postoperative PCA morphine pump for pain control.  Physical therapy was begun the day of surgery with CPM.  On postoperative day #1 the patient was awake and alert and had nausea x 1.  Temperature maximum was 100.2 degrees.  The dressing was dry.  She was neurovascularly intact bilaterally and otherwise stable.  Urine output was 850 cc.  She  continued with physical therapy and Coumadin.  The second postoperative day the patient was awake and alert with moderate pain.  No difficulty voiding.  Still passing gas, no actual bowel movement.  The dressing was dry.  Drain output had become quiescent and drains were discontinued.  She was otherwise stable and continued with physical therapy. She was evaluated by the doctors initial medicine rehabilitation and it was felt she would be a good candidate for rehabilitation versus SACU.  On postoperative day #3 the patient complained of some increase in pain in the right calf.  Voiding without difficulty.  Temperature maximum 100.3 degrees, ranging 98.6 degrees.  Hemoglobin 8.7, hematocrit 24.5.  INR 1.9.  Dressings were dry.  The wounds were benign bilaterally.  The left calf was soft and nontender and the right calf was tender to palpation and compression, otherwise neurovascularly intact.  She underwent venous Dopplers which ruled out DVT of the left lower extremity.  She continued with physical therapy and was awaiting SACU placement.  On the fourth postoperative day the patient was awake and alert with decreased pain.  CPM was going well and she was tolerating it better.  She continued to make slow but steady progress with physical therapy.  Vital signs were stable.  The dressing was dry.  She was tolerating CPM 0-60 degrees and was stable and otherwise okay for rehabilitation transfer.  This was accomplished that day.  She will continue with pain management and physical therapy and will return to see [place] in one weeks time for her final discharge from therapy. ASSESSMENT: DD:  01/28/00 TD:  01/30/00 Job: [id number]

## 2011-02-06 NOTE — Discharge Summary (Signed)
[name] [name], CONWAY              ACCOUNT NO.:  [id number]   MEDICAL RECORD NO.:  [id number]          PATIENT TYPE:  INP   LOCATION:  5041                         FACILITY:  [facility]   PHYSICIAN:  Kathalene Frames. Mayer Camel, M.D.   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   PROCEDURE WHILE IN THE HOSPITAL:  Right total hip arthroplasty.   DISCHARGE SUMMARY:  The patient is 60 year old woman who has had 2  previous total knee arthroplasties and has done quite well with those.  She now has end stage arthritis of the right hip.  She is morbidly obese  which makes this a complex procedure to put in a total hip, but as her  total knees have done well and her pain is unrelenting having failed  conservative management, she wishes to proceed with total hip  arthroplasty.  Risk and benefits were discussed and she wishes to  proceed.   ALLERGIES:  PENICILLIN and CODEINE.   MEDICATIONS AT TIME OF ADMISSION:  Bupropion hydrochloride, Crestor,  Benazepril, hydrochlorothiazide, Travatan, fluoxetine, amlodipine,  metformin and Mobic.   PAST MEDICAL HISTORY:  Usual childhood diseases.   ADULT HISTORY:  Hypertension, diabetes and degenerative joint disease.   PAST SURGICAL HISTORY:  1. Hysterectomy in 1994.  2. Bilateral total knees in 2001.  No difficulties with DVT.   SOCIAL HISTORY:  Positive ethanol occasionally.  No tobacco or IV drug  abuse.  She is single and lives alone.   FAMILY HISTORY:  Unremarkable.   PHYSICAL EXAMINATION:  GENERAL:  The patient has positive glasses,  recent root canal surgery and ankle swelling but denies any chest pain  or shortness of breath.  The patient is 5 feet 4 inches and 307 pounds.  HEAD:  Normocephalic, atraumatic.  EYES:  Pupils equal, round, reactive to light and  accommodation.  NOSE:  Patent.  THROAT:  Benign.  NECK:  Supple.  Full range of motion.  CHEST:  Clear to auscultation and percussion.  HEART:  Regular rate and rhythm.  ABDOMEN:  Soft and nontender.  EXTREMITIES:  Right hip range of motion internal and external rotation  only 10 and 15 degrees respectively.  Positive pain with range of  motion, but tense negative.  Positive pain on the sciatic notch.  SKIN:  Within normal limits with multiple tattoos.   X-rays show flattening of the femoral head with bone on bone loss of  cartilage at the acetabular weightbearing areas.   Preoperative labs including CBC, CMP, chest x-ray, EKG, PT and PTT were  within normal limits with the exception of WBC of 12.3.   HOSPITAL COURSE:  On the day of admission the patient was taken to the  operating room at [facility] where she underwent a complex  right total hip arthroplasty using a DePuy 50 mm ASR cuff 18 x 13 x 42 x  150 S-ROMstem and an 18 piece small cone  NK posterior 45 mm ultimate  ball.  Foley catheter was placed perioperatively.  She was placed on  perioperative antibiotics.  She was placed on postoperative Coumadin  prophylaxis with target INR of 1.5 to 2 per pharmacy protocol with  bridging Lovenox therapy until she became therapeutic.  Physical therapy  was begun first postoperative day.  She was placed on a PCA of Demerol  for pain control.  On postoperative day 1, the patient was awake, alert  and in moderate pain.  She was taking meds well.  No nausea or vomiting.  Vital signs were stable.  T-max 100.  Hemoglobin 9.4, WBC 12.4, urine  output 250.  PT 13.9.  X-ray show well placed, well fixed total hip.  She is otherwise stable.  Continue with therapy.  Postoperative day 2  the patient was without complaint.  Out of bed to the door.  PT reports  good progress.  T-max 100.  Hemoglobin 8.6, WBC 13 and INR 1.4.  Dressing was dry.  PCA was discontinued.  Foley was discontinued.   Physical therapy was continued.  Postoperative day 3 the patient was  awake and alert.  She had walked up and down the hallway but had not yet  passed her PT goals of independent transfers and stairs.  Vital signs  were stable.  She was afebrile and hemoglobin dropped to 8.1 but no  symptoms.  INR 1.8.  Continue with physical therapy.  Postoperative day  4 the patient was without complaints.  She was afebrile.  INR 1.6.  Dressing was dry.  Calf was soft.  She is otherwise neurovascularly  intact.  She is hemodynamically stable and seemed to be improved and was  discharged home in the care of her family after passing physical therapy  goals.  Dressing changed at discharge and then daily as needed.  Activities:  Weight bearing as tolerated with walker and total hip  precautions.  Home health PT.  Home health RN for [facility] home [facility] and  will also manage her Coumadin therapy for the next 2 weeks with a target  INR of 1.5 to 2.  She will also be given a prescription for Robaxin and  Tylox.  She will resume the Bupropion, Crestor, benazepril, Travatan,  fluoxetine, amlodipine and metformin.  Diet is regular.  She is to  return to the clinic in approximately 1 weeks time with Dr. Mayer Camel.  Return should she have any signs of infection as discussed prior to her  discharge.      [employer]ell [employer]. Mercie Eon.      Kathalene Frames. Mayer Camel, M.D.  Electronically Signed    JBR/MEDQ  D:  06/08/2007  T:  06/08/2007  Job:  [id number]

## 2011-02-06 NOTE — Op Note (Signed)
[place]. [facility]  Patient:    KARN, DERK                     MRN: [id number] Proc. Date: 01/05/00 Adm. Date:  [id number] Disc. Date: [id number] Attending:  Alger Simons T CC:         Alta Corning, M.D.                           Operative Report  PREOPERATIVE DIAGNOSIS:  Severe bilateral degenerative joint disease.  POSTOPERATIVE DIAGNOSIS:  Severe bilateral degenerative joint disease.  OPERATION PERFORMED:  Bilateral knee replacement.  The right knee was done by myself with the assistance of Dr. Mayer Camel.  The left knee was done by Dr. Mayer Camel with my assistance.  SURGEON:  Alta Corning, M.D.  ASSISTANT:  Debroah Baller, M.D.  ANESTHESIA:  INDICATIONS FOR PROCEDURE:  The patient is a 60 year old female with a long history of bilateral degenerative joint disease who has failed all conservative care including limitation of activity, anti-inflammatory medication, use of a cane and because of severe degenerative joint disease, she was taken to the operating room for bilateral knee replacement.  She had a long discussion about the risks and benefits and wished to proceed with the bilateral surgery.  DESCRIPTION OF PROCEDURE:  The patient was taken to the operating room and after adequate anesthesia was obtained with general anesthetic, the patient was placed supine on the operating table.  A Foley catheter was then inserted and intravenous antibiotics were given and both legs were then prepped and draped in the usual sterile fashion.  Following this, Esmarch exsanguination of the right extremity was undertaken and a blood pressure tourniquet was inflated to 350 mmHg.  Following this, a midline incision was made in subcutaneous tissues and taken down to the level of the extensor retinaculum. Flaps were raised from the medial side to allow access for the medial parapatellar approach.  This was undertaken and the medial periosteum  was stripped off to allow for access to the posterior aspect of the knee.  The medial and lateral meniscus were removed.  The anterior and posterior cruciate ligaments were removed.  The patella was everted and the knee was flexed off. The fat pad was removed at this point as well as removal of periosteum to allow for access to the proximal centimeter of the proximal tibia.  At this point an intramedullary alignment guide was used to gain access to the proximal tibia.  This was checked with an extramedullary guide and the proximal tibial plateau was resected perpendicular to the long axis of the tibia with a 0 degree slope.  Following this, attention was turned to the femoral side where a central pilot hole was drilled in the end of the femur and the femur was then sized to be a 7.  The 7 cutting guide was then used and distal femoral resections were made as well as anterior, posterior and chamfer cuts and the Scorpio guide was used x 7 to allow for scorpiozation of the femur.  Following this, attention was turned back to the tibia where the tibia was sized.  A 9 mm tibial tray was felt to be most appropriate and this was put in place.  Once this was put in place then the guides were used to allow access to the tibia.  Once the tibial preparation had been undertaken, trials were put in place  and a trial reduction was undertaken.  Excellent stability was achieved medially and laterally and there was good anterior posterior stability.  Attention was then turned to the patella where a size 9 patellar button was used to match the femoral component.  The following resection of the this, the trial patellar button was put in place, put through a range of motion, no tendency towards lateral tracking of the patella.  At this point all components were removed and the knee was copiously irrigated and suctioned dry.  The bony surfaces then continued to be dry and the batch with Cefurox antibiotic was  mixed and all components were cemented in to place with a 10 mm spacer which had previously been trialed and felt to be appropriate.  No tendency towards medial and lateral subluxation of the components undertaken adn the patellar button was cemented into place.  Once all the cement was dried the knee was put through a range of motion and felt to be stable and ati closure was undertaken while the left knee surgery was begun.  The closure was with a #1 Vicryl running suture to the closure of the medial retinacular opening.  The medium Hemovac drains were put in place prior to this closure. The deep layers were closed with 0 and 2-0 Vicryl and the skin with skin staples.  A sterile compressive dressing was applied and the Hemovac drain was put in place and the procedure was continued on the opposite side.  The opposite side procedure was begun after cement was hardened on the right side necessitating two primary surgeons with the assist as described. DD:  01/14/00 TD:  01/14/00 Job: [id number]

## 2011-03-04 ENCOUNTER — Ambulatory Visit: Payer: Federal, State, Local not specified - PPO | Admitting: *Deleted

## 2011-05-29 ENCOUNTER — Ambulatory Visit (INDEPENDENT_AMBULATORY_CARE_PROVIDER_SITE_OTHER): Payer: Federal, State, Local not specified - PPO | Admitting: General Surgery

## 2011-05-29 ENCOUNTER — Encounter (INDEPENDENT_AMBULATORY_CARE_PROVIDER_SITE_OTHER): Payer: Self-pay | Admitting: General Surgery

## 2011-05-29 DIAGNOSIS — K912 Postsurgical malabsorption, not elsewhere classified: Secondary | ICD-10-CM

## 2011-05-29 NOTE — Patient Instructions (Signed)
Continue routine exercise and high protein diet as you are doing. Please call for any questions or concerns.

## 2011-05-29 NOTE — Progress Notes (Signed)
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 She reports she is doing very well. Her weight is down approximately 70 pounds from preop of 295 to current to 227. She denies any abdominal or GI complaints. She is now off of all her diabetic medications. She is down to one antihypertensive. She was previously on 10 medications and is now on 3. Energy level is good. She has had some hair loss that has stabilized.  Exam: Generally looks well. No adenopathy. Chest is clear. Abdomen is soft and nontender without hernias organomegaly or masses.  Status post laparoscopic Roux-en-Y gastric bypass with very good weight loss and excellent resolution of comorbidities. She is exercising regular. She is working a high protein diet. We will check routine blood work in color with the results. Return in 6 months.

## 2011-05-30 LAB — COMPREHENSIVE METABOLIC PANEL
ALT: 23 U/L (ref 0–35)
CO2: 24 mEq/L (ref 19–32)
Calcium: 9.6 mg/dL (ref 8.4–10.5)
Chloride: 100 mEq/L (ref 96–112)
Creat: 1.09 mg/dL (ref 0.50–1.10)
Total Protein: 7.5 g/dL (ref 6.0–8.3)

## 2011-05-30 LAB — CBC WITH DIFFERENTIAL/PLATELET
Eosinophils Relative: 1 % (ref 0–5)
HCT: 41.2 % (ref 36.0–46.0)
Hemoglobin: 12.8 g/dL (ref 12.0–15.0)
Lymphocytes Relative: 18 % (ref 12–46)
Lymphs Abs: 2 10*3/uL (ref 0.7–4.0)
MCV: 82.6 fL (ref 78.0–100.0)
Monocytes Relative: 5 % (ref 3–12)
Platelets: 278 10*3/uL (ref 150–400)
RBC: 4.99 MIL/uL (ref 3.87–5.11)
WBC: 10.9 10*3/uL — ABNORMAL HIGH (ref 4.0–10.5)

## 2011-05-30 LAB — IRON AND TIBC: TIBC: 355 ug/dL (ref 250–470)

## 2011-05-30 LAB — FOLATE: Folate: >20 ng/mL

## 2011-06-01 ENCOUNTER — Telehealth (INDEPENDENT_AMBULATORY_CARE_PROVIDER_SITE_OTHER): Payer: Self-pay

## 2011-06-01 NOTE — Telephone Encounter (Signed)
Patient aware of results.

## 2011-07-06 LAB — PROTIME-INR
INR: 1.6 — ABNORMAL HIGH
INR: 1.8 — ABNORMAL HIGH
INR: 0.9
Prothrombin Time: 19 — ABNORMAL HIGH
Prothrombin Time: 21.3 — ABNORMAL HIGH
Prothrombin Time: 12.3

## 2011-07-06 LAB — URINALYSIS, ROUTINE W REFLEX MICROSCOPIC
Bilirubin Urine: NEGATIVE
Ketones, ur: NEGATIVE
Nitrite: NEGATIVE
Protein, ur: NEGATIVE
Urobilinogen, UA: 0.2
pH: 5

## 2011-07-06 LAB — CBC
HCT: 24.9 — ABNORMAL LOW
HCT: 27.3 — ABNORMAL LOW
HCT: 38.6
Hemoglobin: 8.6 — ABNORMAL LOW
Hemoglobin: 13.1
MCHC: 34.4
MCV: 82.8
MCV: 83
MCV: 83.2
MCV: 83.1
Platelets: 240
RBC: 2.82 — ABNORMAL LOW
RBC: 3.01 — ABNORMAL LOW
RBC: 3.29 — ABNORMAL LOW
WBC: 12.4 — ABNORMAL HIGH
WBC: 13.1 — ABNORMAL HIGH
WBC: 12.3 — ABNORMAL HIGH

## 2011-07-06 LAB — APTT: aPTT: 28

## 2011-07-06 LAB — ABO/RH: ABO/RH(D): A POS

## 2011-07-06 LAB — COMPREHENSIVE METABOLIC PANEL
Alkaline Phosphatase: 108
BUN: 10
Chloride: 100
Creatinine, Ser: 0.74
GFR calc non Af Amer: >60
Glucose, Bld: 101 — ABNORMAL HIGH
Potassium: 3.7
Total Bilirubin: 0.4

## 2011-07-06 LAB — DIFFERENTIAL
Basophils Absolute: 0.1
Basophils Relative: 0
Neutro Abs: 9.1 — ABNORMAL HIGH
Neutrophils Relative %: 74

## 2011-07-06 LAB — URINE MICROSCOPIC-ADD ON

## 2011-11-05 ENCOUNTER — Encounter (INDEPENDENT_AMBULATORY_CARE_PROVIDER_SITE_OTHER): Payer: Self-pay | Admitting: General Surgery

## 2012-06-10 ENCOUNTER — Telehealth (INDEPENDENT_AMBULATORY_CARE_PROVIDER_SITE_OTHER): Payer: Self-pay | Admitting: General Surgery

## 2012-06-10 NOTE — Telephone Encounter (Signed)
I left a voicemail for patient advising the patient to please call [facility] @ [phone] to schedule a follow-up appointment...cef

## 2015-11-08 ENCOUNTER — Other Ambulatory Visit: Payer: Self-pay | Admitting: Orthopedic Surgery

## 2015-11-08 ENCOUNTER — Other Ambulatory Visit (HOSPITAL_COMMUNITY): Payer: Self-pay | Admitting: Orthopedic Surgery

## 2015-11-08 DIAGNOSIS — Z96641 Presence of right artificial hip joint: Secondary | ICD-10-CM

## 2015-11-18 ENCOUNTER — Ambulatory Visit (HOSPITAL_COMMUNITY)
Admission: RE | Admit: 2015-11-18 | Discharge: 2015-11-18 | Disposition: A | Discharge status: Home / Self Care | Payer: Federal, State, Local not specified - PPO | Source: Ambulatory Visit | Attending: Orthopedic Surgery | Admitting: Orthopedic Surgery

## 2015-11-18 ENCOUNTER — Other Ambulatory Visit (HOSPITAL_COMMUNITY): Payer: Self-pay | Admitting: Orthopedic Surgery

## 2015-11-18 DIAGNOSIS — Z96641 Presence of right artificial hip joint: Secondary | ICD-10-CM | POA: Diagnosis present

## 2015-11-18 DIAGNOSIS — M1612 Unilateral primary osteoarthritis, left hip: Secondary | ICD-10-CM | POA: Insufficient documentation

## 2016-01-31 ENCOUNTER — Observation Stay (HOSPITAL_COMMUNITY)
Admission: EM | Admit: 2016-01-31 | Discharge: 2016-02-01 | Disposition: A | Discharge status: Home / Self Care | Payer: Federal, State, Local not specified - PPO | Attending: Internal Medicine | Admitting: Internal Medicine

## 2016-01-31 ENCOUNTER — Encounter (HOSPITAL_COMMUNITY): Payer: Self-pay | Admitting: Emergency Medicine

## 2016-01-31 ENCOUNTER — Observation Stay (HOSPITAL_COMMUNITY): Payer: Federal, State, Local not specified - PPO

## 2016-01-31 ENCOUNTER — Observation Stay (HOSPITAL_BASED_OUTPATIENT_CLINIC_OR_DEPARTMENT_OTHER): Payer: Federal, State, Local not specified - PPO

## 2016-01-31 ENCOUNTER — Emergency Department (HOSPITAL_COMMUNITY): Payer: Federal, State, Local not specified - PPO

## 2016-01-31 DIAGNOSIS — Z9884 Bariatric surgery status: Secondary | ICD-10-CM | POA: Insufficient documentation

## 2016-01-31 DIAGNOSIS — I10 Essential (primary) hypertension: Secondary | ICD-10-CM | POA: Diagnosis not present

## 2016-01-31 DIAGNOSIS — E1165 Type 2 diabetes mellitus with hyperglycemia: Secondary | ICD-10-CM | POA: Insufficient documentation

## 2016-01-31 DIAGNOSIS — I638 Other cerebral infarction: Principal | ICD-10-CM | POA: Insufficient documentation

## 2016-01-31 DIAGNOSIS — G8929 Other chronic pain: Secondary | ICD-10-CM | POA: Diagnosis not present

## 2016-01-31 DIAGNOSIS — I152 Hypertension secondary to endocrine disorders: Secondary | ICD-10-CM | POA: Diagnosis present

## 2016-01-31 DIAGNOSIS — I6389 Other cerebral infarction: Secondary | ICD-10-CM

## 2016-01-31 DIAGNOSIS — M25551 Pain in right hip: Secondary | ICD-10-CM | POA: Diagnosis not present

## 2016-01-31 DIAGNOSIS — I6381 Other cerebral infarction due to occlusion or stenosis of small artery: Secondary | ICD-10-CM | POA: Diagnosis present

## 2016-01-31 DIAGNOSIS — Z87891 Personal history of nicotine dependence: Secondary | ICD-10-CM | POA: Diagnosis not present

## 2016-01-31 DIAGNOSIS — I6789 Other cerebrovascular disease: Secondary | ICD-10-CM | POA: Diagnosis not present

## 2016-01-31 DIAGNOSIS — Z79899 Other long term (current) drug therapy: Secondary | ICD-10-CM | POA: Insufficient documentation

## 2016-01-31 DIAGNOSIS — I517 Cardiomegaly: Secondary | ICD-10-CM | POA: Insufficient documentation

## 2016-01-31 DIAGNOSIS — E119 Type 2 diabetes mellitus without complications: Secondary | ICD-10-CM | POA: Diagnosis present

## 2016-01-31 DIAGNOSIS — R000 Tachycardia, unspecified: Secondary | ICD-10-CM | POA: Diagnosis not present

## 2016-01-31 DIAGNOSIS — I639 Cerebral infarction, unspecified: Secondary | ICD-10-CM

## 2016-01-31 DIAGNOSIS — E669 Obesity, unspecified: Secondary | ICD-10-CM

## 2016-01-31 DIAGNOSIS — J3489 Other specified disorders of nose and nasal sinuses: Secondary | ICD-10-CM | POA: Diagnosis present

## 2016-01-31 DIAGNOSIS — R739 Hyperglycemia, unspecified: Secondary | ICD-10-CM | POA: Diagnosis present

## 2016-01-31 DIAGNOSIS — Z880 Allergy status to penicillin: Secondary | ICD-10-CM | POA: Insufficient documentation

## 2016-01-31 DIAGNOSIS — I6782 Cerebral ischemia: Secondary | ICD-10-CM | POA: Diagnosis not present

## 2016-01-31 DIAGNOSIS — Z6841 Body Mass Index (BMI) 40.0 and over, adult: Secondary | ICD-10-CM | POA: Diagnosis not present

## 2016-01-31 DIAGNOSIS — E236 Other disorders of pituitary gland: Secondary | ICD-10-CM | POA: Diagnosis not present

## 2016-01-31 DIAGNOSIS — R2990 Unspecified symptoms and signs involving the nervous system: Secondary | ICD-10-CM | POA: Insufficient documentation

## 2016-01-31 DIAGNOSIS — E1159 Type 2 diabetes mellitus with other circulatory complications: Secondary | ICD-10-CM | POA: Diagnosis present

## 2016-01-31 DIAGNOSIS — R220 Localized swelling, mass and lump, head: Secondary | ICD-10-CM

## 2016-01-31 HISTORY — DX: Essential (primary) hypertension: I10

## 2016-01-31 LAB — I-STAT CHEM 8, ED
BUN: 18 mg/dL (ref 6–20)
CALCIUM ION: 1.06 mmol/L — AB (ref 1.13–1.30)
Chloride: 104 mmol/L (ref 101–111)
Creatinine, Ser: 0.7 mg/dL (ref 0.44–1.00)
Glucose, Bld: 152 mg/dL — ABNORMAL HIGH (ref 65–99)
HCT: 45 % (ref 36.0–46.0)
Hemoglobin: 15.3 g/dL — ABNORMAL HIGH (ref 12.0–15.0)
Potassium: 4.4 mmol/L (ref 3.5–5.1)
SODIUM: 139 mmol/L (ref 135–145)
TCO2: 22 mmol/L (ref 0–100)

## 2016-01-31 LAB — GLUCOSE, CAPILLARY: Glucose-Capillary: 144 mg/dL — ABNORMAL HIGH (ref 65–99)

## 2016-01-31 LAB — RAPID URINE DRUG SCREEN, HOSP PERFORMED
AMPHETAMINES: NOT DETECTED
BARBITURATES: NOT DETECTED
BENZODIAZEPINES: NOT DETECTED
Cocaine: NOT DETECTED
Opiates: NOT DETECTED
Tetrahydrocannabinol: NOT DETECTED

## 2016-01-31 LAB — DIFFERENTIAL
Basophils Absolute: 0.1 10*3/uL (ref 0.0–0.1)
Basophils Relative: 1 %
EOS PCT: 1 %
Eosinophils Absolute: 0.1 10*3/uL (ref 0.0–0.7)
LYMPHS ABS: 1.5 10*3/uL (ref 0.7–4.0)
LYMPHS PCT: 14 %
MONO ABS: 0.5 10*3/uL (ref 0.1–1.0)
Monocytes Relative: 5 %
Neutro Abs: 8.3 10*3/uL — ABNORMAL HIGH (ref 1.7–7.7)
Neutrophils Relative %: 79 %

## 2016-01-31 LAB — URINALYSIS, ROUTINE W REFLEX MICROSCOPIC
Glucose, UA: NEGATIVE mg/dL
HGB URINE DIPSTICK: NEGATIVE
Ketones, ur: NEGATIVE mg/dL
Leukocytes, UA: NEGATIVE
NITRITE: NEGATIVE
PROTEIN: NEGATIVE mg/dL
Specific Gravity, Urine: 1.009 (ref 1.005–1.030)
pH: 6.5 (ref 5.0–8.0)

## 2016-01-31 LAB — LIPID PANEL
CHOL/HDL RATIO: 4.1 ratio
Cholesterol: 256 mg/dL — ABNORMAL HIGH (ref 0–200)
HDL: 63 mg/dL (ref >40)
LDL CALC: 153 mg/dL — AB (ref 0–99)
Triglycerides: 198 mg/dL — ABNORMAL HIGH (ref <150)
VLDL: 40 mg/dL (ref 0–40)

## 2016-01-31 LAB — COMPREHENSIVE METABOLIC PANEL
ALT: 12 U/L — ABNORMAL LOW (ref 14–54)
AST: 18 U/L (ref 15–41)
Albumin: 3.8 g/dL (ref 3.5–5.0)
Alkaline Phosphatase: 110 U/L (ref 38–126)
Anion gap: 13 (ref 5–15)
BILIRUBIN TOTAL: 0.5 mg/dL (ref 0.3–1.2)
BUN: 16 mg/dL (ref 6–20)
CALCIUM: 9.2 mg/dL (ref 8.9–10.3)
CO2: 23 mmol/L (ref 22–32)
Chloride: 104 mmol/L (ref 101–111)
Creatinine, Ser: 0.77 mg/dL (ref 0.44–1.00)
GFR calc Af Amer: >60 mL/min (ref >60)
Glucose, Bld: 159 mg/dL — ABNORMAL HIGH (ref 65–99)
POTASSIUM: 4.5 mmol/L (ref 3.5–5.1)
Sodium: 140 mmol/L (ref 135–145)
TOTAL PROTEIN: 7 g/dL (ref 6.5–8.1)

## 2016-01-31 LAB — ECHOCARDIOGRAM COMPLETE
HEIGHTINCHES: 63 in
Weight: 4048 oz

## 2016-01-31 LAB — CBC
HEMATOCRIT: 42.1 % (ref 36.0–46.0)
HEMOGLOBIN: 13.7 g/dL (ref 12.0–15.0)
MCH: 26.5 pg (ref 26.0–34.0)
MCHC: 32.5 g/dL (ref 30.0–36.0)
MCV: 81.4 fL (ref 78.0–100.0)
Platelets: 302 10*3/uL (ref 150–400)
RBC: 5.17 MIL/uL — AB (ref 3.87–5.11)
RDW: 14.4 % (ref 11.5–15.5)
WBC: 10.3 10*3/uL (ref 4.0–10.5)

## 2016-01-31 LAB — PROTIME-INR
INR: 1.01 (ref 0.00–1.49)
Prothrombin Time: 13.5 seconds (ref 11.6–15.2)

## 2016-01-31 LAB — I-STAT TROPONIN, ED: TROPONIN I, POC: 0 ng/mL (ref 0.00–0.08)

## 2016-01-31 LAB — ETHANOL: Alcohol, Ethyl (B): <5 mg/dL (ref <5)

## 2016-01-31 LAB — APTT: aPTT: 26 seconds (ref 24–37)

## 2016-01-31 LAB — CBG MONITORING, ED: GLUCOSE-CAPILLARY: 155 mg/dL — AB (ref 65–99)

## 2016-01-31 MED ORDER — STROKE: EARLY STAGES OF RECOVERY BOOK
Freq: Once | Status: DC
  Filled 2016-01-31: qty 1

## 2016-01-31 MED ORDER — INSULIN ASPART 100 UNIT/ML ~~LOC~~ SOLN
0.0000 [IU] | Freq: Three times a day (TID) | SUBCUTANEOUS | Status: DC
  Administered 2016-02-01: 2 [IU] via SUBCUTANEOUS

## 2016-01-31 MED ORDER — ETODOLAC 500 MG PO TABS: 500.0000 mg | ORAL_TABLET | Freq: Every day | ORAL | Status: DC

## 2016-01-31 MED ORDER — SENNOSIDES-DOCUSATE SODIUM 8.6-50 MG PO TABS: 1.0000 | ORAL_TABLET | Freq: Every evening | ORAL | Status: DC | PRN

## 2016-01-31 MED ORDER — VITAMIN B-12 1000 MCG PO TABS
1000.0000 ug | ORAL_TABLET | Freq: Every day | ORAL | Status: DC
  Administered 2016-02-01: 1000 ug via ORAL
  Filled 2016-01-31: qty 1

## 2016-01-31 MED ORDER — TRAMADOL HCL 50 MG PO TABS
50.0000 mg | ORAL_TABLET | Freq: Four times a day (QID) | ORAL | Status: DC | PRN
  Administered 2016-01-31 – 2016-02-01 (×3): 50 mg via ORAL
  Filled 2016-01-31 (×3): qty 1

## 2016-01-31 MED ORDER — SENNOSIDES-DOCUSATE SODIUM 8.6-50 MG PO TABS
1.0000 | ORAL_TABLET | Freq: Every day | ORAL | Status: DC
  Administered 2016-01-31: 1 via ORAL
  Filled 2016-01-31: qty 1

## 2016-01-31 MED ORDER — ATORVASTATIN CALCIUM 10 MG PO TABS: 10.0000 mg | ORAL_TABLET | Freq: Every day | ORAL | Status: DC

## 2016-01-31 MED ORDER — ETODOLAC 300 MG PO CAPS
500.0000 mg | ORAL_CAPSULE | Freq: Every day | ORAL | Status: DC
  Administered 2016-02-01: 500 mg via ORAL
  Filled 2016-01-31: qty 1

## 2016-01-31 MED ORDER — ENOXAPARIN SODIUM 40 MG/0.4ML ~~LOC~~ SOLN
40.0000 mg | SUBCUTANEOUS | Status: DC
  Filled 2016-01-31 (×2): qty 0.4

## 2016-01-31 MED ORDER — ASPIRIN 300 MG RE SUPP: 300.0000 mg | Freq: Every day | RECTAL | Status: DC

## 2016-01-31 MED ORDER — ASPIRIN 325 MG PO TABS
325.0000 mg | ORAL_TABLET | Freq: Every day | ORAL | Status: DC
  Administered 2016-02-01: 325 mg via ORAL
  Filled 2016-01-31: qty 1

## 2016-01-31 MED ORDER — INSULIN ASPART 100 UNIT/ML ~~LOC~~ SOLN: 0.0000 [IU] | Freq: Every day | SUBCUTANEOUS | Status: DC

## 2016-01-31 MED ORDER — AMITRIPTYLINE HCL 25 MG PO TABS
25.0000 mg | ORAL_TABLET | Freq: Two times a day (BID) | ORAL | Status: DC
  Administered 2016-01-31: 25 mg via ORAL
  Filled 2016-01-31 (×2): qty 1

## 2016-01-31 NOTE — ED Provider Notes (Signed)
CSN: [id number]     Arrival date & time 01/31/16  1125 History   First MD Initiated Contact with Patient 01/31/16 1136     Chief Complaint  Patient presents with  . Numbness     Patient is a 65 y.o. female presenting with weakness. The history is provided by the patient.  Weakness This is a new problem. The current episode started 6 to 12 hours ago. The problem occurs constantly. The problem has not changed since onset.Pertinent negatives include no chest pain and no headaches. Nothing aggravates the symptoms. Nothing relieves the symptoms.  patient with h/o diabetes/hypertension reports onset of right facial/hand numbness and mild weakness Started at approximately 630am soon after waking up this morning No other significant symptoms She denies h/o stroke No HA No CP No LE weakness reported. She went to an urgent care then was sent to the ER   Past Medical History  Diagnosis Date  . Diabetes mellitus without complication ([place])   . Hypertension    Past Surgical History  Procedure Laterality Date  . Bariatric surgery  11/24/10    RNY  . Joint replacement    . Tonsillectomy    . Abdominal hysterectomy     History reviewed. No pertinent family history. Social History  Substance Use Topics  . Smoking status: Former Research scientist (life sciences)  . Smokeless tobacco: None  . Alcohol Use: Yes   OB History    No data available     Review of Systems  Constitutional: Negative for fever.  Cardiovascular: Negative for chest pain.  Musculoskeletal: Positive for arthralgias.       Chronic right hip pain   Neurological: Positive for weakness and numbness. Negative for headaches.  All other systems reviewed and are negative.     Allergies  Zolpidem tartrate; Codeine; and Penicillins  Home Medications   Prior to Admission medications   Medication Sig Start Date End Date Taking? Authorizing Provider  amitriptyline (ELAVIL) 25 MG tablet Take 25 mg by mouth 2 (two) times daily. Patient will  confirm dosage at next visit.     Historical Provider, MD  BIOTIN PO Take 10,000 mcg by mouth daily.      Historical Provider, MD  calcium-vitamin D (OSCAL WITH D) 500-200 MG-UNIT per tablet Take 1 tablet by mouth daily.      Historical Provider, MD  Cyanocobalamin (B-12 PO) Take by mouth daily.      Historical Provider, MD  etodolac (LODINE) 500 MG tablet Take 500 mg by mouth daily.      Historical Provider, MD  furosemide (LASIX) 40 MG tablet Take 40 mg by mouth daily.      Historical Provider, MD  IRON PO Take 65 mg by mouth daily.      Historical Provider, MD  Multiple Vitamin (MULTIVITAMIN) capsule Take 1 capsule by mouth daily.      Historical Provider, MD   BP 172/99 mmHg  Pulse 101  Resp 18  Ht _0  (1.6 m)  Wt 114.76 kg  BMI 44.83 kg/m2  SpO2 99% Physical Exam CONSTITUTIONAL: Well developed/well nourished HEAD: Normocephalic/atraumatic EYES: EOMI/PERRL ENMT: Mucous membranes moist NECK: supple no meningeal signs CV: S1/S2 noted, no murmurs/rubs/gallops noted LUNGS: Lungs are clear to auscultation bilaterally, no apparent distress ABDOMEN: soft, nontender, no rebound or guarding, bowel sounds noted throughout abdomen GU:no cva tenderness NEURO: Pt is awake/alert/appropriate, moves all extremitiesx4.  ?mild right facial droop.  She has sensory deficit to right face and fingers on right hand.  No arm/leg  drift EXTREMITIES: full ROM SKIN: warm, color normal PSYCH: no abnormalities of mood noted, alert and oriented to situation  ED Course  Procedures  Last known well at 630am tPA in stroke considered but not given due to: Rapid improvement/severity mild No TPA advised by dr Leonel Ramsay with neuro  12:43 PM Pt stable No new complaints Neuro feels she may have had mild CVA Will admit Pt agreeable D/w [facility], karen, for admission to dr Sherral Hammers service  Labs Review Labs Reviewed  CBC - Abnormal; Notable for the following:    RBC 5.17 (*)    All other components within  normal limits  DIFFERENTIAL - Abnormal; Notable for the following:    Neutro Abs 8.3 (*)    All other components within normal limits  COMPREHENSIVE METABOLIC PANEL - Abnormal; Notable for the following:    Glucose, Bld 159 (*)    ALT 12 (*)    All other components within normal limits  I-STAT CHEM 8, ED - Abnormal; Notable for the following:    Glucose, Bld 152 (*)    Calcium, Ion 1.06 (*)    Hemoglobin 15.3 (*)    All other components within normal limits  CBG MONITORING, ED - Abnormal; Notable for the following:    Glucose-Capillary 155 (*)    All other components within normal limits  ETHANOL  PROTIME-INR  APTT  URINE RAPID DRUG SCREEN, HOSP PERFORMED  URINALYSIS, ROUTINE W REFLEX MICROSCOPIC (NOT AT [facility])  I-STAT TROPOININ, ED    Imaging Review Ct Head Wo Contrast  01/31/2016  CLINICAL DATA:  Right side numbness and numbness about the right side of the lips beginning at 6:30 this morning. Initial encounter. EXAM: CT HEAD WITHOUT CONTRAST TECHNIQUE: Contiguous axial images were obtained from the base of the skull through the vertex without intravenous contrast. COMPARISON:  None. FINDINGS: There is no evidence of acute intracranial abnormality including hemorrhage, infarct, mass lesion, mass effect, midline shift or abnormal extra-axial fluid collection. No hydrocephalus or pneumocephalus. Hypoattenuation in the subcortical and periventricular deep white matter is noted. The calvarium is intact. Empty sella turcica is noted. The left sphenoid sinus is opacified. IMPRESSION: No acute intracranial abnormality. Chronic microvascular ischemic change. Electronically Signed   By: Inge Rise M.D.   On: 01/31/2016 12:01   I have personally reviewed and evaluated these lab results as part of my medical decision-making.   EKG Interpretation   Date/Time:  Friday Jan 31 2016 11:58:12 EDT Ventricular Rate:  100 PR Interval:  162 QRS Duration: 86 QT Interval:  339 QTC Calculation:  437 R Axis:   -18 Text Interpretation:  Sinus tachycardia Borderline left axis deviation  Anterior infarct, old Baseline wander in lead(s) II III aVL aVF V3 V4 V5  No significant change since last tracing Confirmed by Christy Gentles  MD, Elenore Rota  [phone]) on 01/31/2016 12:13:15 PM      MDM   Final diagnoses:  Cerebrovascular accident (CVA) due to other mechanism [facility])    Nursing notes including past medical history and social history reviewed and considered in documentation Labs/vital reviewed myself and considered during evaluation     Ripley Fraise, MD 01/31/16 1244

## 2016-01-31 NOTE — ED Notes (Signed)
Code stroke activated @ 11:32 am

## 2016-01-31 NOTE — H&P (Signed)
[facility] History and Physical  [name] [name] PJK:[id number] DOB: 02-20-1951 DOA: 01/31/2016  Referring physician: Christy Gentles PCP: Brion Aliment, MD   Chief Complaint: right face/hand numbness  HPI: [name] [name] is a a very pleasant WF  65 y.o. female with a past medical history that includes obesity, hypertension, hyperglycemia presents to the emergency department with chief complaint of numbness on her right mouth face and last 3 fingers of her right hand. Initial evaluation concerning for stroke.  Information is obtained from the patient. She states she's been in her usual state of health until she awakened this morning. She September 1 cup of coffee noticed that the right side of her mouth lips were numb. He also noted some numbness to her right third fourth and fifth fingers particularly on the tips. She states that her mouth feels like "I had Novocain from the dentist". She denies headache visual disturbances dizziness syncope or near-syncope. She denies chest pain palpitation shortness of breath lower extremity edema. She denies any other numbness or tingling of extremities. She denies any weakness. She denies difficulty chewing or swallowing. She denies dysuria hematuria frequency or urgency. She does report some acute constipation that she attributes to use of pain medications over the last 3 weeks from chronic hip pain.  In the emergency department she's afebrile hemodynamically stable and not hypoxic. Code stroke is called on patient arrival. Evaluated by neurology. She is outside the window for treatment with TPA. CT is completed.   Review of Systems:  10 point review of systems complete and all systems are negative except as indicated in the history of present illness  Past Medical History  Diagnosis Date  . Diabetes mellitus without complication ([place])   . Hypertension   . Obesity    Past Surgical History  Procedure Laterality Date  . Bariatric surgery  11/24/10     RNY  . Joint replacement    . Tonsillectomy    . Abdominal hysterectomy     Social History:  reports that she has quit smoking. She does not have any smokeless tobacco history on file. She reports that she drinks alcohol. She reports that she does not use illicit drugs.  Allergies  Allergen Reactions  . Zolpidem Tartrate Other (See Comments)    Night terrors.  . Codeine Nausea And Vomiting  . Penicillins Other (See Comments)    Made nose run uncontrollably   History reviewed. No pertinent family history.  Prior to Admission medications   Medication Sig Start Date End Date Taking? Authorizing Provider  amitriptyline (ELAVIL) 25 MG tablet Take 25 mg by mouth 2 (two) times daily. Patient will confirm dosage at next visit.     Historical Provider, MD  BIOTIN PO Take 10,000 mcg by mouth daily.      Historical Provider, MD  calcium-vitamin D (OSCAL WITH D) 500-200 MG-UNIT per tablet Take 1 tablet by mouth daily.      Historical Provider, MD  Cyanocobalamin (B-12 PO) Take by mouth daily.      Historical Provider, MD  etodolac (LODINE) 500 MG tablet Take 500 mg by mouth daily.      Historical Provider, MD  furosemide (LASIX) 40 MG tablet Take 40 mg by mouth daily.      Historical Provider, MD  IRON PO Take 65 mg by mouth daily.      Historical Provider, MD  Multiple Vitamin (MULTIVITAMIN) capsule Take 1 capsule by mouth daily.      Historical Provider, MD   Physical  Exam: Filed Vitals:   01/31/16 1200 01/31/16 1245  BP: 172/99 170/101  Pulse: 101 97  Resp: 18 22  Height: _0  (1.6 m)   Weight: 114.76 kg (253 lb)   SpO2: 99% 98%    Wt Readings from Last 3 Encounters:  01/31/16 114.76 kg (253 lb)  05/29/11 103.239 kg (227 lb 9.6 oz)    General:  Appears calm and comfortable Eyes: PERRL, normal lids, irises & conjunctiva ENT: grossly normal hearing, lips & tongue Neck: no LAD, masses or thyromegaly Cardiovascular: RRR, no m/r/g. No LE edema. Telemetry: SR, no arrhythmias   Respiratory: CTA bilaterally, no w/r/r. Normal respiratory effort. Abdomen: soft, ntnd Obese positive bowel sounds throughout no guarding or rebounding Skin: no rash or induration seen on limited exam Musculoskeletal: grossly normal tone BUE/BLE, joint without swelling/erythema Psychiatric: grossly normal mood and affect, speech fluent and appropriate Neurologic: grossly non-focal.Cranial nerves II through XII intact, tongue/uvula midline,  finger nose finger bilateral within normal limits, quick finger touch bilateral within normal limits, heel to shin bilateral within normal limits, negative dysarthria, negative expressive aphasia, negative receptive aphasia Each clear facial symmetry lateral grip 5 out of 5 bilateral lower extremity strength 5 out of 5. Derease sensation to right lower lip particularly. Decreased sensation right fourth and fifth fingers.           Labs on Admission:  Basic Metabolic Panel:  Recent Labs Lab 01/31/16 1153 01/31/16 1156  NA 139 140  K 4.4 4.5  CL 104 104  CO2  --  23  GLUCOSE 152* 159*  BUN 18 16  CREATININE 0.70 0.77  CALCIUM  --  9.2   Liver Function Tests:  Recent Labs Lab 01/31/16 1156  AST 18  ALT 12*  ALKPHOS 110  BILITOT 0.5  PROT 7.0  ALBUMIN 3.8   No results for input(s): LIPASE, AMYLASE in the last 168 hours. No results for input(s): AMMONIA in the last 168 hours. CBC:  Recent Labs Lab 01/31/16 1153 01/31/16 1156  WBC  --  10.3  NEUTROABS  --  8.3*  HGB 15.3* 13.7  HCT 45.0 42.1  MCV  --  81.4  PLT  --  302   Cardiac Enzymes: No results for input(s): CKTOTAL, CKMB, CKMBINDEX, TROPONINI in the last 168 hours.  BNP (last 3 results) No results for input(s): BNP in the last 8760 hours.  ProBNP (last 3 results) No results for input(s): PROBNP in the last 8760 hours.  CBG:  Recent Labs Lab 01/31/16 1203  GLUCAP 155*    Radiological Exams on Admission: Ct Head Wo Contrast  01/31/2016  CLINICAL DATA:  Right  side numbness and numbness about the right side of the lips beginning at 6:30 this morning. Initial encounter. EXAM: CT HEAD WITHOUT CONTRAST TECHNIQUE: Contiguous axial images were obtained from the base of the skull through the vertex without intravenous contrast. COMPARISON:  None. FINDINGS: There is no evidence of acute intracranial abnormality including hemorrhage, infarct, mass lesion, mass effect, midline shift or abnormal extra-axial fluid collection. No hydrocephalus or pneumocephalus. Hypoattenuation in the subcortical and periventricular deep white matter is noted. The calvarium is intact. Empty sella turcica is noted. The left sphenoid sinus is opacified. IMPRESSION: No acute intracranial abnormality. Chronic microvascular ischemic change. Electronically Signed   By: Inge Rise M.D.   On: 01/31/2016 12:01    EKG: Independently reviewed. Sinus tachycardia Borderline left axis deviation Anterior infarct, old Baseline wander in lead(s) II III aVL aVF V3 V4 V5  Assessment/Plan Principal Problem:   Stroke-like symptoms Active Problems:   Hypertension   Hyperglycemia   Obesity  #1. Strokelike symptoms. Patient reports numbness inside of her mouth on the right and lower lip as well as numbness right hand third fourth and fifth fingers. Risk factors include former smoker, obesity, HTN.  CT of the head no acute intracranial abnormalities -Admit to telemetry -Obtain chest x-ray -Obtain 2-D echo and carotid Dopplers -Obtain MRI/MRA of the brain -Obtain lipid panel and hemoglobin A1c -Carb modified heart healthy diet as she has passed bedside swallow eval -Evaluation by PT and OT and speech therapy -Aspirin and statin  2. Hypertension. Blood pressure with only fair control. Medications include Lasix. -Hold Lasix for now -monitor blood pressure closely and resume as indicated  3. Hyperglycemia. serum Glucose 159 on admission. She denies history of diabetes. Reports last A1c  5 -Monitor capillary blood sugars -Sliding scale insulin -We'll obtain a hemoglobin A1c  4. Obesity. BMI 44.9. History of gastric bypass 2012 -Nutritional consult  Dr Leonel Ramsay neuro    Code Status: full DVT Prophylaxis: Family Communication: friend at bedside Disposition Plan: home when ready  Time spent: 57 minutes  [facility]   Examined patient and discussed the assessment and plan with NP [facility] and agree with above plan.  Care during the described time interval was provided by myself and NP [facility]  . I have reviewed this patient's available data, including medical history, events of note, physical examination, and all test results as part of my evaluation. I have personally reviewed and interpreted all radiology studies.

## 2016-01-31 NOTE — Code Documentation (Signed)
65yo female presented to [facility] via private vehicle at 1125.  Patient reports waking up this morning at 0630 and going to get her coffee and noting right oral numbness and right finger numbness. LKW 0500.  Code stroke called on patient arrival.  Stroke team to the bedside.  Patient to CT.  CT completed.  NIHSS 1, see documentation for details and code stroke times.  Patient reports subjective decreased sensation to her right leg, however, patient with h/o neuropathy.  Patient continuing to report right lip and oral numbness as well as numbness to her right 3rd, 4th and 5th fingers.  Patient is outside the window for treatment with tPA.  No acute stroke treatment at this time.  Bedside handoff with ED RN Erlene Quan.

## 2016-01-31 NOTE — ED Notes (Signed)
Meal tray given

## 2016-01-31 NOTE — Consult Note (Signed)
Neurology Consultation Reason for Consult: Right-sided numbness Referring Physician: Christy Gentles, D  CC: Right-sided numbness  History is obtained from: Patient  HPI: [name] [name] is a 65 y.o. female will this morning around 5 AM and was normal, she then fell back asleep and awoke at 6:30 and on taking a sip of water noticed that the inside of her right mouth was numb. She has numbness in the medial 3 digits of her right hand as well. This is all new. She denies any weakness or other symptoms. She denies any changes in her vision. The deficits have been static since they started.   LKW: 5 AM tpa given?: no, outside of window    ROS: A 14 point ROS was performed and is negative except as noted in the HPI.   Past Medical History  Diagnosis Date  . Diabetes mellitus without complication ([place])   . Hypertension   . Obesity     hx gastric bypass     Family history: Stroke   Social History:  reports that she has quit smoking. She does not have any smokeless tobacco history on file. She reports that she drinks alcohol. She reports that she does not use illicit drugs.   Exam: Current vital signs: BP 190/94 mmHg  Pulse 93  Temp(Src) 98.8 F (37.1 C) (Oral)  Resp 20  Ht _0  (1.6 m)  Wt 114.76 kg (253 lb)  BMI 44.83 kg/m2  SpO2 96% Vital signs in last 24 hours: Temp:  [98.8 F (37.1 C)] 98.8 F (37.1 C) (05/12 1800) Pulse Rate:  [91-101] 93 (05/12 1800) Resp:  [13-22] 20 (05/12 1800) BP: (157-190)/(84-101) 190/94 mmHg (05/12 1800) SpO2:  [96 %-99 %] 96 % (05/12 1800) Weight:  [114.76 kg (253 lb)] 114.76 kg (253 lb) (05/12 1200)   Physical Exam  Constitutional: Appears well-developed and well-nourished.  Psych: Affect appropriate to situation Eyes: No scleral injection HENT: No OP obstrucion Head: Normocephalic.  Cardiovascular: Normal rate and regular rhythm.  Respiratory: Effort normal and breath sounds normal to anterior ascultation GI: Soft.  No distension.  There is no tenderness.  Skin: WDI  Neuro: Mental Status: Patient is awake, alert, oriented to person, place, month, year, and situation. Patient is able to give a clear and coherent history. No signs of aphasia or neglect Cranial Nerves: II: Visual Fields are full. Pupils are equal, round, and reactive to light.   III,IV, VI: EOMI without ptosis or diploplia.  V: Facial sensation is symmetric to temperature VII: Facial movement is notable for possible mild facial weakness VIII: hearing is intact to voice X: Uvula elevates symmetrically XI: Shoulder shrug is symmetric. XII: tongue is midline without atrophy or fasciculations.  Motor: Tone is normal. Bulk is normal. 5/5 strength was present in all four extremities.  Sensory: Sensation is decreased in the right medial 3 digits of the right hand. 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 This is typically associated with thalamic infarcts.  Recommendations: 1. HgbA1c, fasting lipid panel 2. MRI, MRA  of the brain without contrast 3. Frequent neuro checks 4. Echocardiogram 5. Carotid dopplers 6. Prophylactic therapy-Antiplatelet med: Aspirin - dose 382m PO or 3079mPR 7. Risk factor modification 8. Telemetry  monitoring 9. PT consult, OT consult, Speech consult 10. please page stroke NP  Or  PA  Or MD  M-F from 8am -4 pm starting 5/13 as this patient will be followed by the stroke team at this point.   You can look them up on [email or web address]      Roland Rack, MD [facility] [phone]  If 7pm- 7am, please page neurology on call as listed in [place].

## 2016-01-31 NOTE — Progress Notes (Signed)
VASCULAR LAB PRELIMINARY  PRELIMINARY  PRELIMINARY  PRELIMINARY  Carotid duplex  completed.    Preliminary report:  Bilateral:  1-39% ICA stenosis.  Vertebral artery flow is antegrade.      Andric Kerce, RVT 01/31/2016, 2:00 PM

## 2016-01-31 NOTE — ED Notes (Signed)
Assisted pt to the restroom

## 2016-01-31 NOTE — ED Notes (Signed)
Pt still in MRI

## 2016-01-31 NOTE — ED Notes (Addendum)
Pt states when she got up this morning 0630 her right side of her mouth was numb. Pt also states she has numbness in her right fingers. Pt denies any pain. Pt states she got up at 0500 and was normal then went back to bad

## 2016-01-31 NOTE — Progress Notes (Signed)
Patient arrived from the ED to room 5M10 at thisd time. Safety precautions and orders reviewed with patient. TELE applied and confirmed. Patient requested pain medication and sleeping medication at this time. MD paged of her arrival and her requests. Patient presents no deficit and abl;e to ambulates in room without any assistance at this time. Will continue to monitor.   Roshell Brigham, RN.

## 2016-01-31 NOTE — Progress Notes (Signed)
*  PRELIMINARY RESULTS* Echocardiogram 2D Echocardiogram has been performed.  [name] [name] 01/31/2016, 2:41 PM

## 2016-02-01 DIAGNOSIS — R220 Localized swelling, mass and lump, head: Secondary | ICD-10-CM

## 2016-02-01 DIAGNOSIS — R739 Hyperglycemia, unspecified: Secondary | ICD-10-CM

## 2016-02-01 DIAGNOSIS — E237 Disorder of pituitary gland, unspecified: Secondary | ICD-10-CM

## 2016-02-01 DIAGNOSIS — I639 Cerebral infarction, unspecified: Secondary | ICD-10-CM

## 2016-02-01 DIAGNOSIS — I10 Essential (primary) hypertension: Secondary | ICD-10-CM | POA: Diagnosis not present

## 2016-02-01 DIAGNOSIS — J3489 Other specified disorders of nose and nasal sinuses: Secondary | ICD-10-CM | POA: Diagnosis present

## 2016-02-01 DIAGNOSIS — I6381 Other cerebral infarction due to occlusion or stenosis of small artery: Secondary | ICD-10-CM | POA: Diagnosis present

## 2016-02-01 LAB — GLUCOSE, CAPILLARY
GLUCOSE-CAPILLARY: 98 mg/dL (ref 65–99)
Glucose-Capillary: 124 mg/dL — ABNORMAL HIGH (ref 65–99)
Glucose-Capillary: 107 mg/dL — ABNORMAL HIGH (ref 65–99)

## 2016-02-01 LAB — TSH: TSH: 2.07 u[IU]/mL (ref 0.350–4.500)

## 2016-02-01 MED ORDER — ATORVASTATIN CALCIUM 40 MG PO TABS: 40.0000 mg | ORAL_TABLET | Freq: Every day | ORAL | Status: DC

## 2016-02-01 MED ORDER — SENNOSIDES-DOCUSATE SODIUM 8.6-50 MG PO TABS: 1.0000 | ORAL_TABLET | Freq: Every evening | ORAL | Status: DC | PRN

## 2016-02-01 MED ORDER — ASPIRIN 325 MG PO TABS: 325.0000 mg | ORAL_TABLET | Freq: Every day | ORAL | Status: DC

## 2016-02-01 MED ORDER — ATORVASTATIN CALCIUM 40 MG PO TABS
40.0000 mg | ORAL_TABLET | Freq: Every day | ORAL | Status: DC
  Administered 2016-02-01: 40 mg via ORAL
  Filled 2016-02-01: qty 1

## 2016-02-01 MED ORDER — ROSUVASTATIN CALCIUM 20 MG PO TABS: 20.0000 mg | ORAL_TABLET | Freq: Every day | ORAL | Status: DC

## 2016-02-01 NOTE — Progress Notes (Signed)
Nutrition Brief Note  Pt had consult ordered and nutrition consult was mentioned in H&p, but the consult appeared to have been canceled shortly after being placed. RD followed up with pt nurse today. He did not know anything about the consult order and subsequent D/C.  RD briefly followed up with patient. She says she does not take any supplements at this time. It sounded like she was told to take supplements though. She has not followed up with surgeon since and, in fact, she has not seen any doctors in that time.   She says recently her b12 was measured and found to be high. Whoever that provider apparently told her at that time to stop taking all supplements. RD went over reccommended list of supplements: IRon, B12, 2X mvi, Calcium + Vit D. She noted recently her iron was a little low.   Wt Readings from Last 10 Encounters:  01/31/16 253 lb (114.76 kg)  05/29/11 227 lb 9.6 oz (103.239 kg)    She says that her pre-rygb weight was 326 lbs. Per wt history she has gained some of weight back.   Body mass index is 44.83 kg/(m^2). Patient meets criteria for morbid obesity based on current BMI.   She is not the most health conscious person. She was asking to be taken off "this stupid heart healthy diet". Upon asking if she was a big salt eater, she said "yes". She hates Mrs. Dash.   RD asked if she followed a diabetic diet. She says she does not and her last a1c was 5.5. There is no recent a1c to confirm or dispute. Her recent lipid panel showed high LDL, total cho, TG. Does not reflect a healthy diet.   Pt was socializing with friends and did not seem to what to talk much. RD consult appeared to be canceled so did not press diet education.Her diet education consulted may have been d/cd due to her not being open to education.   Burtis Junes RD, LDN Clinical Nutrition Pager: [phone] 02/01/2016 11:03 AM

## 2016-02-01 NOTE — Progress Notes (Signed)
D/C orders received, pt for D/C home today.  IV and telemetry D/C.  Rx and D/C instructions given with verbalized understanding.  Family at bedside to assist with D/C.  Staff brought pt downstairs via wheelchair.

## 2016-02-01 NOTE — Evaluation (Signed)
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 MRI-+ left thalamus infarct.    Clinical Impression   Pt. Is having numbness in tips of fingers 3-5 on r hand. Pt. Coordination is intact. Pt. Was ed to use different textures to rub along digits to increase sensation. Pt. Verbalized understanding. Pt. Is able to perform all ADLs with modified technique secondary to hip pain. Pt. Was ed on use of AE for LE ADLs for pain relief.      Follow Up Recommendations  No OT follow up    Equipment Recommendations  None recommended by OT    Recommendations for Other Services       Precautions / Restrictions Precautions Precautions: Fall Precaution Comments: secondary to left hip pain/failure of hardware from THA. Scheduled for surgery in July. Restrictions Weight Bearing Restrictions: No      Mobility Bed Mobility Overal bed mobility: Needs Assistance             General bed mobility comments: Up in chair upon PT arrival.   Transfers Overall transfer level: Modified independent               General transfer comment: Stood from chair without difficulty. No LOB. NO assist needed.     Balance Overall balance assessment: Needs assistance Sitting-balance support: Feet supported;No upper extremity supported Sitting balance-Leahy Scale: Normal     Standing balance support: During functional activity Standing balance-Leahy Scale: Good                              ADL Overall ADL's : At baseline                                       General ADL Comments: Pt. has hip pain and  uses modified dressing technique.      Vision Vision Assessment?: No  apparent visual deficits   Perception     Praxis      Pertinent Vitals/Pain Pain Assessment: 0-10 Faces Pain Scale: Hurts little more Pain Location: R hip pain Pain Descriptors / Indicators: Sore Pain Intervention(s): Monitored during session     Hand Dominance     Extremity/Trunk Assessment Upper Extremity Assessment Upper Extremity Assessment: Generalized weakness;RUE deficits/detail RUE Sensation: decreased light touch   Lower Extremity Assessment Lower Extremity Assessment: Overall WFL for tasks assessed (Limited mobility/strength RLE secondary to failed THA)       Communication Communication Communication: No difficulties   Cognition Arousal/Alertness: Awake/alert Behavior During Therapy: WFL for tasks assessed/performed Overall Cognitive Status: Within Functional Limits for tasks assessed                     General Comments       Exercises       Shoulder Instructions      Home Living Family/patient expects to be discharged to:: Private residence Living Arrangements: Alone   Type of Home: House Home Access: Stairs to enter [employer] of Steps: 1   Home Layout: One level     Bathroom Shower/Tub: Gaffer  Bathroom Toilet: Handicapped height Bathroom Accessibility: Yes   Home Equipment: Walker - standard;Shower seat;Grab bars - tub/shower          Prior Functioning/Environment Level of Independence: Independent        Comments: Uses walker at times when right hip pain is intolerable.  Drives. Does not cook or clean much. Eats out a lot. No falls. Sleeps ~30 mins at a time due to hip pain.    OT Diagnosis:     OT Problem List:     OT Treatment/Interventions:      OT Goals(Current goals can be found in the care plan section) Acute Rehab OT Goals Patient Stated Goal: to go home  OT Frequency:     Barriers to D/C:            Co-evaluation              End of Session    Activity Tolerance:  Patient tolerated treatment well Patient left: in bed;with call bell/phone within reach   Time:  -    Charges:  OT General Charges $OT Visit: 1 Procedure OT Evaluation $OT Eval Moderate Complexity: 1 Procedure OT Treatments $Neuromuscular Re-education: 23-37 mins G-Codes: OT G-codes **NOT FOR INPATIENT CLASS** Functional Limitation: Self care Self Care Current Status (J1552): 0 percent impaired, limited or restricted Self Care Goal Status (C8022): 0 percent impaired, limited or restricted Self Care Discharge Status (V3612): 0 percent impaired, limited or restricted  Twilia Yaklin 02/01/2016, 1:23 PM

## 2016-02-01 NOTE — Progress Notes (Signed)
STROKE TEAM PROGRESS NOTE   HISTORY OF PRESENT ILLNESS [name] [name] is a 65 y.o. female will this morning around 5 AM and was normal, she then fell back asleep and awoke at 6:30 and on taking a sip of water noticed that the inside of her right mouth was numb. She has numbness in the medial 3 digits of her right hand as well. This is all new. She denies any weakness or other symptoms. She denies any changes in her vision. The deficits have been static since they started.   LKW: 5 AM tpa given?: no, outside of window   SUBJECTIVE (INTERVAL HISTORY) Patient still with numbness in the fingers and mouth. Otherwise she would like to go home.   OBJECTIVE Temp:  [96.8 F (36 C)-98.8 F (37.1 C)] 98 F (36.7 C) (05/13 0700) Pulse Rate:  [83-101] 84 (05/13 0700) Cardiac Rhythm:  [-] Normal sinus rhythm (05/12 1900) Resp:  [13-22] 20 (05/13 0700) BP: (157-190)/(84-101) 170/89 mmHg (05/13 0700) SpO2:  [94 %-99 %] 98 % (05/13 0700) Weight:  [114.76 kg (253 lb)] 114.76 kg (253 lb) (05/12 1200)  CBC:  Recent Labs Lab 01/31/16 1153 01/31/16 1156  WBC  --  10.3  NEUTROABS  --  8.3*  HGB 15.3* 13.7  HCT 45.0 42.1  MCV  --  81.4  PLT  --  893    Basic Metabolic Panel:  Recent Labs Lab 01/31/16 1153 01/31/16 1156  NA 139 140  K 4.4 4.5  CL 104 104  CO2  --  23  GLUCOSE 152* 159*  BUN 18 16  CREATININE 0.70 0.77  CALCIUM  --  9.2    Lipid Panel:    Component Value Date/Time   CHOL 256* 01/31/2016 1946   TRIG 198* 01/31/2016 1946   HDL 63 01/31/2016 1946   CHOLHDL 4.1 01/31/2016 1946   VLDL 40 01/31/2016 1946   LDLCALC 153* 01/31/2016 1946   HgbA1c:  Lab Results  Component Value Date   HGBA1C * 03/15/2010    7.7 (NOTE)                                                                       According to the ADA Clinical Practice Recommendations for 2011, when HbA1c is used as a screening test:   >=6.5%   Diagnostic of Diabetes Mellitus           (if abnormal result  is  confirmed)  5.7-6.4%   Increased risk of developing Diabetes Mellitus  References:Diagnosis and Classification of Diabetes Mellitus,Diabetes YBOF,7510,25(ENIDP 1):S62-S69 and Standards of Medical Care in         Diabetes - 2011,Diabetes OEUM,3536,14  (Suppl 1):S11-S61.   Urine Drug Screen:    Component Value Date/Time   LABOPIA NONE DETECTED 01/31/2016 1220   COCAINSCRNUR NONE DETECTED 01/31/2016 1220   LABBENZ NONE DETECTED 01/31/2016 1220   AMPHETMU NONE DETECTED 01/31/2016 1220   THCU NONE DETECTED 01/31/2016 1220   LABBARB NONE DETECTED 01/31/2016 1220      IMAGING  Dg Chest 2 View 01/31/2016   No active cardiopulmonary disease.   Ct Head Wo Contrast 01/31/2016   No acute intracranial abnormality. Chronic microvascular ischemic change.     Mr Jodene Nam Head/brain ER  Cm 01/31/2016   1. Acute lacunar infarct in the left thalamus.  2. Possible subacute lacunar infarct in the right splenium of the corpus callosum.  3. Moderate chronic small vessel ischemic disease.  4.  5. No major intracranial arterial occlusion or significant proximal posterior circulation stenosis.  6. Severe proximal left M2 MCA stenoses.     PHYSICAL EXAM  Physical exam: Exam: Gen: NAD, conversant, well nourised, obese, well groomed                     CV: RRR, no MRG. No Carotid Bruits. No peripheral edema, warm, nontender Eyes: Conjunctivae clear without exudates or hemorrhage  Neuro: Detailed Neurologic Exam  Speech:    Speech is normal; fluent and spontaneous with normal comprehension.  Cognition:    The patient is oriented to person, place, and time;  Cranial Nerves:    The pupils are equal, round, and reactive to light. The fundi are normal and spontaneous venous pulsations are present. Visual fields are full to finger confrontation. Extraocular movements are intact. Decreased sensation lower right face.muscles of mastication are normal. The face is symmetric. The palate elevates in the midline.  Hearing intact. Voice is normal. Shoulder shrug is normal. The tongue has normal motion without fasciculations.   Coordination:    No dysmetria  Gait:    Wide based but stable  Motor Observation:    No asymmetry, no atrophy, and no involuntary movements noted. Tone:    Normal muscle tone.    Posture:    Posture is normal. normal erect    Strength:    Strength is V/V in the upper and lower limbs.      Sensation: dec. Right digits 3-5     Reflex Exam:    ASSESSMENT/PLAN [name] [name] is a 65 y.o. female with history of diabetes mellitus, hypertension, and obesity  presenting with right-sided numbness.  She did not receive IV t-PA due to late presentation.  Stroke:  Bilateral infarcts possibly embolic from an unknown source. However may both be from small vessel disease due to HTN, diabetes and HLD.   Resultant  Numbness irght face and right digits 3-5.  MRI   Acute lacunar infarct in the left thalamus. Possible subacute lacunar infarct in the right splenium of the corpus callosum  MRA - Severe proximal left M2 MCA stenoses.   Carotid Doppler - Unremarkable  2D Echo - EF 55-60%. No cardiac source of emboli identified.  LDL 153  HgbA1c pending  Recommend 30-day outpatient holter monitor and follow up in neurology in one month with Dr. Jaynee Eagles.   VTE prophylaxis - Lovenox  Diet Heart Room service appropriate?: Yes; Fluid consistency:: Thin  No antithrombotic prior to admission, now on aspirin 325 mg daily  Patient counseled to be compliant with her antithrombotic medications  Ongoing aggressive stroke risk factor management  Therapy recommendations: No follow-up therapies recommended  Disposition: Pending  Hypertension  Stable - somewhat high  Permissive hypertension (OK if < 220/120) but gradually normalize in 5-7 days  Hyperlipidemia  Home meds:  No lipid lowering medications prior to admission  LDL 153, goal < 70  Now on Lipitor 40 mg  daily  Continue statin at discharge  Diabetes  HgbA1c pending, goal < 7.0   Other Stroke Risk Factors  Advanced age  Cigarette smoker, quit smoking   Obesity, Body mass index is 44.83 kg/(m^2).   Family hx stroke    Other Active Problems  Mass involving  the sella, left cavernous sinus, and left sphenoid sinus, suspicious for invasive pituitary macroadenoma. Further evaluation with nonemergent pituitary protocol MRI (without and with contrast) is recommended.   Hospital day #    Personally examined patient and images, and have participated in and made any corrections needed to history, physical, neuro exam,assessment and plan as stated above.  I have personally obtained the history, evaluated lab date, reviewed imaging studies and agree with radiology interpretations.   Stroke team will sign off. Patient can be discharged on ASA and Statin and scheduled for outpatient 30-day heart monitor and follow up with Dr. Jaynee Eagles in the next month for next steps pending heart monitor results. Needs to follow very closely with primary care for management of vascular risk factors.  I had a long d/w patient about her recent stroke, risk for recurrent stroke/TIAs, personally independently reviewed imaging studies and stroke evaluation results and answered questions.Continue ASA and statin for secondary stroke prevention and maintain strict control of hypertension with blood pressure goal below 130/90, diabetes with hemoglobin A1c goal below 6.5% and lipids with LDL cholesterol goal below 70 mg/dL. I also advised the patient to eat a healthy diet with plenty of whole grains, cereals, fruits and vegetables, exercise regularly and maintain ideal body weight .Followup in the future with me in1 month call earlier if necessary.  Sarina Ill, MD Stroke Neurology [phone] [name]      To contact Stroke Continuity provider, please refer to [email or web address] After hours, contact  General Neurology

## 2016-02-01 NOTE — Progress Notes (Signed)
SLP Cancellation Note  Patient Details Name: [name] MRN: [id number] DOB: Feb 13, 1951   Cancelled treatment:       Reason Eval/Treat Not Completed: SLP screened, no needs identified per chart/discussion with PT, will sign off   Juan Quam Laurice 02/01/2016, 12:18 PM

## 2016-02-01 NOTE — Evaluation (Signed)
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 MRI-+ left thalamus infarct.   Clinical Impression  Patient presents with numbness right side of lips and numbness through 3 digits on right hand s/p CVA. Pt with premorbid hip pain due to failed hardware with THA. This has been limiting her mobility and sleeping for the last month. Tolerated gait training with supervision progressing to Mod I for safety due to discomfort from hip. Discussed techniques to help alleviate hip pain and encouraged daily mobility while in hospital. Pt independent PTA. Discussed signs/symptoms of CVA. Pt does not require further skilled therapy services as pt functioning at baseline. Discharge from therapy.     Follow Up Recommendations No PT follow up    Equipment Recommendations  None recommended by PT    Recommendations for Other Services       Precautions / Restrictions Precautions Precautions: Fall Precaution Comments: secondary to left hip pain/failure of hardware from THA. Scheduled for surgery in July.      Mobility  Bed Mobility Overal bed mobility: Needs Assistance             General bed mobility comments: Up in chair upon PT arrival.   Transfers Overall transfer level: Modified independent               General transfer comment: Stood from chair without difficulty. No LOB. NO assist needed.   Ambulation/Gait Ambulation/Gait assistance: Supervision;Modified independent (Device/Increase time) Ambulation Distance (Feet): 150 Feet Assistive device: None Gait Pattern/deviations: Step-through pattern;Decreased stride length;Antalgic;Decreased stance time - right;Decreased step length -  left;Drifts right/left;Wide base of support   Gait velocity interpretation: Below normal speed for age/gender General Gait Details: "Waddling" antalgic like gait pattern secondary to right hip pain- premorbid. reports it catches sometimes and gets stuck.   Stairs            Wheelchair Mobility    Modified Rankin (Stroke Patients Only) Modified Rankin (Stroke Patients Only) Pre-Morbid Rankin Score: Moderate disability Modified Rankin: Moderate disability     Balance Overall balance assessment: Needs assistance Sitting-balance support: Feet supported;No upper extremity supported Sitting balance-Leahy Scale: Normal     Standing balance support: During functional activity Standing balance-Leahy Scale: Good                               Pertinent Vitals/Pain Pain Assessment: Faces Faces Pain Scale: Hurts little more Pain Location: right hip- premorbid Pain Descriptors / Indicators: Sore Pain Intervention(s): Monitored during session;Repositioned    Home Living Family/patient expects to be discharged to:: Private residence Living Arrangements: Alone   Type of Home: House Home Access: Stairs to enter   Technical [employer]wer of Steps: 1 Home Layout: One level Home Equipment: Walker - standard;Shower seat      Prior Function Level of Independence: Independent         Comments: Uses walker at times when right hip pain is intolerable.  Drives. Does not cook or clean much. Eats out a lot. No falls. Sleeps ~30 mins at a time due to hip pain.     Hand Dominance        Extremity/Trunk Assessment  Upper Extremity Assessment: Defer to OT evaluation           Lower Extremity Assessment: Overall WFL for tasks assessed (Limited mobility/strength RLE secondary to failed THA)         Communication   Communication: No difficulties  Cognition Arousal/Alertness: Awake/alert Behavior During Therapy: WFL for tasks assessed/performed Overall  Cognitive Status: Within Functional Limits for tasks assessed                      General Comments      Exercises        Assessment/Plan    PT Assessment Patent does not need any further PT services  PT Diagnosis Difficulty walking;Abnormality of gait   PT Problem List    PT Treatment Interventions     PT Goals (Current goals can be found in the Care Plan section) Acute Rehab PT Goals Patient Stated Goal: to get this hip fixed PT Goal Formulation: All assessment and education complete, DC therapy    Frequency     Barriers to discharge        Co-evaluation               End of Session Equipment Utilized During Treatment: Gait belt Activity Tolerance: Patient tolerated treatment well Patient left: in chair;with call bell/phone within reach Nurse Communication: Mobility status    Functional Assessment Tool Used: clinical judgment Functional Limitation: Mobility: Walking and moving around Mobility: Walking and Moving Around Current Status (D9242): At least 1 percent but less than 20 percent impaired, limited or restricted Mobility: Walking and Moving Around Goal Status [phone]): At least 1 percent but less than 20 percent impaired, limited or restricted Mobility: Walking and Moving Around Discharge Status [phone]): At least 1 percent but less than 20 percent impaired, limited or restricted    Time: 9798-9211 PT Time Calculation (min) (ACUTE ONLY): 24 min   Charges:   PT Evaluation $PT Eval Moderate Complexity: 1 Procedure PT Treatments $Gait Training: 8-22 mins   PT G Codes:   PT G-Codes **NOT FOR INPATIENT CLASS** Functional Assessment Tool Used: clinical judgment Functional Limitation: Mobility: Walking and moving around Mobility: Walking and Moving Around Current Status (H4174): At least 1 percent but less than 20 percent impaired, limited or restricted Mobility: Walking and Moving Around Goal Status [phone]): At least 1 percent but less than 20  percent impaired, limited or restricted Mobility: Walking and Moving Around Discharge Status [phone]): At least 1 percent but less than 20 percent impaired, limited or restricted    [place] 02/01/2016, 12:26 PM Wray Kearns, Roxie, DPT [phone]

## 2016-02-02 LAB — PROLACTIN: Prolactin: 11.1 ng/mL (ref 4.8–23.3)

## 2016-02-03 ENCOUNTER — Other Ambulatory Visit: Payer: Self-pay | Admitting: Cardiology

## 2016-02-03 ENCOUNTER — Telehealth: Payer: Self-pay | Admitting: Neurology

## 2016-02-03 DIAGNOSIS — I6309 Cerebral infarction due to thrombosis of other precerebral artery: Secondary | ICD-10-CM

## 2016-02-03 LAB — HEMOGLOBIN A1C
Hgb A1c MFr Bld: 6.2 % — ABNORMAL HIGH (ref 4.8–5.6)
MEAN PLASMA GLUCOSE: 131 mg/dL

## 2016-02-03 NOTE — Telephone Encounter (Signed)
Excellent, I am so glad she called! See if she can come in Friday at noon. Otherwise I will work her in anytime earlier this week. In the meantime I want to order a repeat MRI of her brain, is she willing or does she want to see me first to explain further?

## 2016-02-03 NOTE — Telephone Encounter (Signed)
Dr Jaynee Eagles- Can you place order for MRI brain? Pt in agreement to have test. Thank you!  Called pt. Relayed Dr Cathren Laine message below. Pt states she can come Friday at noon per Dr Jaynee Eagles request. I placed her on schedule and I advised her to check in no later than 1145am because our doors lock at 12pm. She is in agreement to have MRI brain done. Advised we will place order and she will be getting a call to schedule. She verbalized understanding.

## 2016-02-03 NOTE — Discharge Summary (Signed)
[facility] Discharge Summary   Patient: [name] [name] OMV:[id number]   PCP: Brion Aliment, MD DOB: 1951-08-19   Date of admission: 01/31/2016   Date of discharge: 02/01/2016     Discharge Diagnoses:  Principal Problem:   Thalamic infarct, acute [facility]) Active Problems:   Hypertension   Hyperglycemia   Obesity   Pituitary mass [facility])  Recommendations for Outpatient Follow-up:  1. Follow-up with PCP in one week regarding blood pressure control 2. Follow-up with Dr. Lavell Anchors regarding further workup for CVA  Follow-up Information    Follow up with Melvenia Beam, MD. Schedule an appointment as soon as possible for a visit in 3 weeks.   Specialty:  Neurology   Why:  earlierst possible.   Contact information:   [address] [place] [address] [phone]       Follow up with HARTMAN,JESSICA, MD. Schedule an appointment as soon as possible for a visit in 1 week.   Specialty:  Family Medicine   Why:  regarding blood pressure management   Contact information:   [address] [place] [address]      Diet recommendation: Cardiac diet  Activity: The patient is advised to gradually reintroduce usual activities.  Discharge Condition: good  History of present illness: As per the H and P dictated on admission, "[name] [name] is a a very pleasant WF 65 y.o. female with a past medical history that includes obesity, hypertension, hyperglycemia presents to the emergency department with chief complaint of numbness on her right mouth face and last 3 fingers of her right hand. Initial evaluation concerning for stroke.  Information is obtained from the patient. She states she's been in her usual state of health until she awakened this morning. She September 1 cup of coffee noticed that the right side of her mouth lips were numb. He also noted some numbness to her right third fourth and fifth fingers particularly on the tips. She states that her mouth feels like "I  had Novocain from the dentist". She denies headache visual disturbances dizziness syncope or near-syncope. She denies chest pain palpitation shortness of breath lower extremity edema. She denies any other numbness or tingling of extremities. She denies any weakness. She denies difficulty chewing or swallowing. She denies dysuria hematuria frequency or urgency. She does report some acute constipation that she attributes to use of pain medications over the last 3 weeks from chronic hip pain.  In the emergency department she's afebrile hemodynamically stable and not hypoxic. Code stroke is called on patient arrival. Evaluated by neurology. She is outside the window for treatment with TPA. CT is completed."  Hospital Course:  Summary of her active problems in the hospital is as following.  Principal Problem:   Thalamic infarct, acute [facility]) Patient presented with right facial numbness as well as left-sided weakness. CT scan shows acute lacunar infarct in the left thalamus. Also subacute lacunar infarct in the right splenium of the corpus callosum. PTOT as well as speech therapy recommended no further therapy requirement for the patient. Patient was not on any antiplatelet medication prior to admission. LDL 153 patient cannot tolerate any statins in the past. Continue Crestor 20 mg. Echocardiogram normal ejection fraction 65-46% with diastolic dysfunction likely secondary to hypertension. Carotid Doppler no significant stenosis. 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  With this the patient will be discharged per her request and will continue to follow up with Dr. Lavell Anchors for outpatient workup.  Active Problems:  Hypertension Blood pressure significantly elevated here in the hospital. Recommend patient to follow-up with PCP with one-week worth of blood pressure logs to start taking antihypertensive medications. A currently allowing permissive hypertension.     Hyperglycemia Mild. Hemoglobin A1c pending. Recommendation to follow-up with PCP.    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 TSH normal prolactin normal. Patient has occasional ocular migraine but no vision permanent loss. Patient denies having any significant headache as well. Patient will need nonemergent pituitary protocol MRI with and without contrast as an outpatient.   All other chronic medical condition were stable during the hospitalization.  Patient was seen by physical therapy, who recommended no further therapy requirement. On the day of the discharge the patient's vitals were stable and symptoms resolved, and no other acute medical condition were reported by patient. the patient was felt safe to be discharge at home family with close follow-up with PCP.  Procedures and Results:  Echocardiogram Study Conclusions  - Left ventricle: The cavity size was normal. Wall thickness was  increased in a pattern of moderate LVH. Systolic function was  normal. The estimated ejection fraction was in the range of 55%  to 60%. Wall motion was normal; there were no regional wall  motion abnormalities. Doppler parameters are consistent with  abnormal left ventricular relaxation (grade 1 diastolic  dysfunction). - Aortic valve: Poorly visualized. Probably trileaflet; moderately  calcified leaflets. There was no stenosis. - Mitral valve: Mildly calcified annulus. There was no significant  regurgitation. - Right ventricle: The cavity size was normal. Systolic function  was normal. - Pulmonary arteries: No complete TR doppler jet so unable to  estimate PA systolic pressure. - Inferior vena cava: The vessel was normal in size. The  respirophasic diameter changes were in the normal range (>= 50%),  consistent with normal central venous pressure.  Impressions:  - Normal  LV size with moderate LV hypertrophy. EF 55-60%. Normal RV  size and systolic function. No significant valvular  abnormalities.   Carotid Doppler  Summary:  - The vertebral arteries appear patent with antegrade flow. - Findings consistent with 1-39 percent stenosis involving the  right internal carotid artery and the left internal carotid  artery.  Consultations:  Neurology  DISCHARGE MEDICATION: Discharge Medication List as of 02/01/2016  6:26 PM    START taking these medications   Details  aspirin 325 MG tablet Take 1 tablet (325 mg total) by mouth daily., Starting 02/01/2016, Until Discontinued, Normal    rosuvastatin (CRESTOR) 20 MG tablet Take 1 tablet (20 mg total) by mouth daily., Starting 02/01/2016, Until Discontinued, Normal    senna-docusate (SENOKOT-S) 8.6-50 MG tablet Take 1 tablet by mouth at bedtime as needed for mild constipation., Starting 02/01/2016, Until Discontinued, Normal      CONTINUE these medications which have NOT CHANGED   Details  beta carotene w/minerals (OCUVITE) tablet Take 1 tablet by mouth daily., Until Discontinued, Historical Med    Coenzyme Q10 (CO Q 10 PO) Take 1 tablet by mouth daily., Until Discontinued, Historical Med    etodolac (LODINE) 500 MG tablet Take 500 mg by mouth 2 (two) times daily. , Until Discontinued, Historical Med    MAGNESIUM PO Take 1 tablet by mouth daily., Until Discontinued, Historical Med    traMADol (ULTRAM) 50 MG tablet Take 50 mg by mouth every 6 (six) hours as needed (pain)., Until Discontinued, Historical Med      STOP taking  these medications     atorvastatin (LIPITOR) 40 MG tablet      Cyanocobalamin (B-12 PO)        Allergies  Allergen Reactions  . Zolpidem Tartrate Other (See Comments)    Night terrors.  . Codeine Nausea And Vomiting  . Penicillins Other (See Comments)    Made nose run uncontrollably   Discharge Instructions    Ambulatory referral to Neurology    Complete by:  As  directed   Dr. Jaynee Eagles requests follow up for this patient in 1 month.     Diet - low sodium heart healthy    Complete by:  As directed      Discharge instructions    Complete by:  As directed   It is important that you read following instructions as well as go over your medication list with RN to help you understand your care after this hospitalization.  Discharge Instructions: Please follow-up with PCP in one week  Please request your primary care physician to go over all Hospital Tests and Procedure/Radiological results at the follow up,  Please get all Hospital records sent to your PCP by signing hospital release before you go home.   Do not take more than prescribed Pain, Sleep and Anxiety Medications. You were cared for by a hospitalist during your hospital stay. If you have any questions about your discharge medications or the care you received while you were in the hospital after you are discharged, you can call the unit and ask to speak with the hospitalist on call if the hospitalist that took care of you is not available.  Once you are discharged, your primary care physician will handle any further medical issues. Please note that NO REFILLS for any discharge medications will be authorized once you are discharged, as it is imperative that you return to your primary care physician (or establish a relationship with a primary care physician if you do not have one) for your aftercare needs so that they can reassess your need for medications and monitor your lab values. You Must read complete instructions/literature along with all the possible adverse reactions/side effects for all the Medicines you take and that have been prescribed to you. Take any new Medicines after you have completely understood and accept all the possible adverse reactions/side effects. Wear Seat belts while driving. If you have smoked or chewed Tobacco in the last 2 yrs please stop smoking and/or stop any Recreational  drug use.     Increase activity slowly    Complete by:  As directed           Discharge Exam: Filed Weights   01/31/16 1200  Weight: 114.76 kg (253 lb)   Filed Vitals:   02/01/16 1338 02/01/16 1703  BP: 177/96 179/99  Pulse: 98 89  Temp: 98.1 F (36.7 C) 98.4 F (36.9 C)  Resp: 18 18   General: Appear in no distress, no Rash; Oral Mucosa moist. Cardiovascular: S1 and S2 Present, no Murmur, no JVD Respiratory: Bilateral Air entry present and Clear to Auscultation, no Crackles, no wheezes Abdomen: Bowel Sound present, Soft and no tenderness Extremities: no Pedal edema, no calf tenderness Neurology: Grossly no focal neuro deficit.  The results of significant diagnostics from this hospitalization (including imaging, microbiology, ancillary and laboratory) are listed below for reference.    Significant Diagnostic Studies: Dg Chest 2 View  01/31/2016  CLINICAL DATA:  Right-sided jaw and hand numbness. Stroke-like symptoms. Hypertension. Diabetes. EXAM: CHEST  2 VIEW COMPARISON:  07/18/2010 FINDINGS: The heart size and mediastinal contours are within normal limits. Both lungs are clear. Mild lower thoracic spine degenerative disc disease again noted. IMPRESSION: No active cardiopulmonary disease. Electronically Signed   By: Earle Gell M.D.   On: 01/31/2016 13:14   Ct Head Wo Contrast  01/31/2016  CLINICAL DATA:  Right side numbness and numbness about the right side of the lips beginning at 6:30 this morning. Initial encounter. EXAM: CT HEAD WITHOUT CONTRAST TECHNIQUE: Contiguous axial images were obtained from the base of the skull through the vertex without intravenous contrast. COMPARISON:  None. FINDINGS: There is no evidence of acute intracranial abnormality including hemorrhage, infarct, mass lesion, mass effect, midline shift or abnormal extra-axial fluid collection. No hydrocephalus or pneumocephalus. Hypoattenuation in the subcortical and periventricular deep white matter is  noted. The calvarium is intact. Empty sella turcica is noted. The left sphenoid sinus is opacified. IMPRESSION: No acute intracranial abnormality. Chronic microvascular ischemic change. Electronically Signed   By: Inge Rise M.D.   On: 01/31/2016 12:01   Mr Brain Wo Contrast  01/31/2016  CLINICAL DATA:  Headache and numbness involving the right side of the mouth and third and fourth digits of the right hand beginning this morning. Hypertension and diabetes. EXAM: MRI HEAD WITHOUT CONTRAST MRA HEAD WITHOUT CONTRAST TECHNIQUE: Multiplanar, multiecho pulse sequences of the brain and surrounding structures were obtained without intravenous contrast. Angiographic images of the head were obtained using MRA technique without contrast. COMPARISON:  Head CT 01/31/2016 FINDINGS: MRI HEAD FINDINGS There is nodular soft tissue in the floor of the sella, greater left of midline and with involvement of the left cavernous sinus. There is opacification of the left sphenoid sinus, and is suspected to this reflects extension of the sellar/cavernous sinus mass into the sphenoid sinus rather than solely sinus inflammatory disease. The sella appears mildly expanded. There is no suprasellar mass. There is a 5 mm focus of mildly hyperintense trace diffusion signal in the right splenium of the corpus callosum without reduced ADC. A 5 mm acute infarct is present in the left thalamus. There is no evidence of intracranial hemorrhage, intra-axial mass, midline shift, or extra-axial fluid collection. Ventricles and sulci are within normal limits for age. Patchy to confluent T2 hyperintensities throughout the cerebral white matter are nonspecific but compatible with moderate chronic small vessel ischemic disease. Prior bilateral cataract extraction is noted. The mastoid air cells are clear. Major intracranial vascular flow voids are preserved. MRA HEAD FINDINGS The visualized distal vertebral arteries are patent with the left being  dominant. PICA and SCA origins are patent. Basilar artery is patent without stenosis. Posterior communicating arteries are not identified. PCAs are patent without evidence of major branch occlusion or significant proximal stenosis. The internal carotid arteries are patent from skullbase to carotid termini. Right internal carotid artery is slightly small diffusely due to hypoplasia of the right A1 segment. No significant focal intracranial ICA stenosis is identified. The ACAs are patent without significant proximal stenosis. M1 segments are patent with at most mild narrowing distally on the left. There are multiple severe proximal left M2 branch vessel stenoses. Mild MCA branch vessel irregularity is present on the right without high-grade proximal stenosis. No intracranial aneurysm is identified. IMPRESSION: 1. Acute lacunar infarct in the left thalamus. 2. Possible subacute lacunar infarct in the right splenium of the corpus callosum. 3. Moderate chronic small vessel ischemic disease. 4. Mass involving the sella, left cavernous sinus, and left sphenoid sinus, suspicious for invasive pituitary macroadenoma.  Further evaluation with nonemergent pituitary protocol MRI (without and with contrast) is recommended. 5. No major intracranial arterial occlusion or significant proximal posterior circulation stenosis. 6. Severe proximal left M2 MCA stenoses. Electronically Signed   By: Logan Bores M.D.   On: 01/31/2016 16:30   Mr Jodene Nam Head/brain Wo Cm  01/31/2016  CLINICAL DATA:  Headache and numbness involving the right side of the mouth and third and fourth digits of the right hand beginning this morning. Hypertension and diabetes. EXAM: MRI HEAD WITHOUT CONTRAST MRA HEAD WITHOUT CONTRAST TECHNIQUE: Multiplanar, multiecho pulse sequences of the brain and surrounding structures were obtained without intravenous contrast. Angiographic images of the head were obtained using MRA technique without contrast. COMPARISON:  Head CT  01/31/2016 FINDINGS: MRI HEAD FINDINGS There is nodular soft tissue in the floor of the sella, greater left of midline and with involvement of the left cavernous sinus. There is opacification of the left sphenoid sinus, and is suspected to this reflects extension of the sellar/cavernous sinus mass into the sphenoid sinus rather than solely sinus inflammatory disease. The sella appears mildly expanded. There is no suprasellar mass. There is a 5 mm focus of mildly hyperintense trace diffusion signal in the right splenium of the corpus callosum without reduced ADC. A 5 mm acute infarct is present in the left thalamus. There is no evidence of intracranial hemorrhage, intra-axial mass, midline shift, or extra-axial fluid collection. Ventricles and sulci are within normal limits for age. Patchy to confluent T2 hyperintensities throughout the cerebral white matter are nonspecific but compatible with moderate chronic small vessel ischemic disease. Prior bilateral cataract extraction is noted. The mastoid air cells are clear. Major intracranial vascular flow voids are preserved. MRA HEAD FINDINGS The visualized distal vertebral arteries are patent with the left being dominant. PICA and SCA origins are patent. Basilar artery is patent without stenosis. Posterior communicating arteries are not identified. PCAs are patent without evidence of major branch occlusion or significant proximal stenosis. The internal carotid arteries are patent from skullbase to carotid termini. Right internal carotid artery is slightly small diffusely due to hypoplasia of the right A1 segment. No significant focal intracranial ICA stenosis is identified. The ACAs are patent without significant proximal stenosis. M1 segments are patent with at most mild narrowing distally on the left. There are multiple severe proximal left M2 branch vessel stenoses. Mild MCA branch vessel irregularity is present on the right without high-grade proximal stenosis. No  intracranial aneurysm is identified. IMPRESSION: 1. Acute lacunar infarct in the left thalamus. 2. Possible subacute lacunar infarct in the right splenium of the corpus callosum. 3. Moderate chronic small vessel ischemic disease. 4. Mass involving the sella, left cavernous sinus, and left sphenoid sinus, suspicious for invasive pituitary macroadenoma. Further evaluation with nonemergent pituitary protocol MRI (without and with contrast) is recommended. 5. No major intracranial arterial occlusion or significant proximal posterior circulation stenosis. 6. Severe proximal left M2 MCA stenoses. Electronically Signed   By: Logan Bores M.D.   On: 01/31/2016 16:30    Microbiology: No results found for this or any previous visit (from the past 240 hour(s)).   Labs: CBC:  Recent Labs Lab 01/31/16 1153 01/31/16 1156  WBC  --  10.3  NEUTROABS  --  8.3*  HGB 15.3* 13.7  HCT 45.0 42.1  MCV  --  81.4  PLT  --  638   Basic Metabolic Panel:  Recent Labs Lab 01/31/16 1153 01/31/16 1156  NA 139 140  K 4.4 4.5  CL 104 104  CO2  --  23  GLUCOSE 152* 159*  BUN 18 16  CREATININE 0.70 0.77  CALCIUM  --  9.2   Liver Function Tests:  Recent Labs Lab 01/31/16 1156  AST 18  ALT 12*  ALKPHOS 110  BILITOT 0.5  PROT 7.0  ALBUMIN 3.8   CBG:  Recent Labs Lab 01/31/16 1203 01/31/16 2149 02/01/16 0650 02/01/16 1120 02/01/16 1617  GLUCAP 155* 144* 124* 107* 98   Time spent: 30 minutes  Signed:  Tynika Luddy  [facility] 02/01/2016 , 8:46 AM

## 2016-02-03 NOTE — Telephone Encounter (Signed)
Pt called to schedule f/u appt. She said "Dr Posey Pronto told her Dr Jaynee Eagles wanted her worked in [place]. She said she knew Dr Jaynee Eagles wanted to run some tests". Pt wants to discuss this with provider. Thank you

## 2016-02-04 ENCOUNTER — Other Ambulatory Visit: Payer: Self-pay | Admitting: Neurology

## 2016-02-04 DIAGNOSIS — D497 Neoplasm of unspecified behavior of endocrine glands and other parts of nervous system: Secondary | ICD-10-CM

## 2016-02-04 NOTE — Telephone Encounter (Signed)
MRi ordered. thanks

## 2016-02-06 ENCOUNTER — Other Ambulatory Visit: Payer: Self-pay | Admitting: Cardiology

## 2016-02-06 DIAGNOSIS — I639 Cerebral infarction, unspecified: Secondary | ICD-10-CM

## 2016-02-06 DIAGNOSIS — I4891 Unspecified atrial fibrillation: Secondary | ICD-10-CM

## 2016-02-06 DIAGNOSIS — I6309 Cerebral infarction due to thrombosis of other precerebral artery: Secondary | ICD-10-CM

## 2016-02-07 ENCOUNTER — Ambulatory Visit (INDEPENDENT_AMBULATORY_CARE_PROVIDER_SITE_OTHER): Payer: Federal, State, Local not specified - PPO | Admitting: Neurology

## 2016-02-07 ENCOUNTER — Encounter: Payer: Self-pay | Admitting: Neurology

## 2016-02-07 VITALS — BP 178/88 | HR 82 | Resp 18 | Ht 63.0 in | Wt 254.0 lb

## 2016-02-07 DIAGNOSIS — E237 Disorder of pituitary gland, unspecified: Secondary | ICD-10-CM

## 2016-02-07 DIAGNOSIS — D496 Neoplasm of unspecified behavior of brain: Secondary | ICD-10-CM

## 2016-02-07 DIAGNOSIS — J3489 Other specified disorders of nose and nasal sinuses: Secondary | ICD-10-CM

## 2016-02-07 DIAGNOSIS — R220 Localized swelling, mass and lump, head: Secondary | ICD-10-CM

## 2016-02-07 MED ORDER — TRAMADOL HCL ER 100 MG PO TB24: ORAL_TABLET | ORAL | Status: DC

## 2016-02-07 NOTE — Progress Notes (Addendum)
[facility]    Provider:  Dr Jaynee Eagles Referring Provider: Brion Aliment, MD Primary Care Physician:  Antony Blackbird, MD  CC:  Stroke, Cavernous sinus mass  HPI:  [name] [name] is a 65 y.o. female here as a follow-up from the hospital after a stroke. She still has numbness in the right fingers and moth due to the left thalamic stroke. We discussed her strokes, she is due for Cardiac Monitoring and then possibly Loop. Will discuss with our Vascular doctors. Also discussed at length the invasive pituitary tumor shown, I reviewed imaging with patient and pointed out findingd, discussed next steps including MRI w/wo with pituitary protocol as well as referral to [place].She was not on an aspirin when this happened.   Reviewed notes, labs and imaging from outside physicians, which showed:  IMPRESSION: 1. Acute lacunar infarct in the left thalamus. 2. Possible subacute lacunar infarct in the right splenium of the corpus callosum. 3. Moderate chronic small vessel ischemic disease. 4. Mass involving the sella, left cavernous sinus, and left sphenoid sinus, suspicious for invasive pituitary macroadenoma. Further evaluation with nonemergent pituitary protocol MRI (without and with contrast) is recommended. NEUROSURGERY REVIEWED AND FEELS THIS IS A MASS OF THE SPHENOID SINUS THAT HAS INVADED THE CAVERNOUS SINUS,NOT PITUITARY TUMOR 5. No major intracranial arterial occlusion or significant proximal posterior circulation stenosis. 6. Severe proximal left M2 MCA stenoses.  Review of Systems: Patient complains of symptoms per HPI as well as the following symptoms: No CP, no SOB, no fever, no chills. Pertinent negatives per HPI. All others negative.   Social History   Social History  . Marital Status: Single    Spouse Name: N/A  . Number of Children: 0  . Years of Education: college   Occupational History  . Retired    Social History Main Topics  . Smoking status: Former  Research scientist (life sciences)  . Smokeless tobacco: Not on file     Comment: Quit Dec 2007  . Alcohol Use: 0.0 oz/week    0 Standard drinks or equivalent per week     Comment: Rare  . Drug Use: No  . Sexual Activity: Not on file   Other Topics Concern  . Not on file   Social History Narrative   Drinks 3 cups of coffee a day     Family History  Problem Relation Age of Onset  . Prostate cancer Father   . Lymphoma Father   . Congestive Heart Failure Maternal Grandmother   . Lung cancer Maternal Grandfather     Past Medical History  Diagnosis Date  . Diabetes mellitus without complication ([place])   . Hypertension   . Obesity     hx gastric bypass  . Stroke [facility])     Past Surgical History  Procedure Laterality Date  . Bariatric surgery  11/24/10    RNY  . Joint replacement    . Tonsillectomy    . Abdominal hysterectomy    . Total hip arthroplasty      Current Outpatient Prescriptions  Medication Sig Dispense Refill  . aspirin 325 MG tablet Take 1 tablet (325 mg total) by mouth daily. 30 tablet 0  . beta carotene w/minerals (OCUVITE) tablet Take 1 tablet by mouth daily.    . Coenzyme Q10 (CO Q 10 PO) Take 1 tablet by mouth daily.    [name] etodolac (LODINE) 500 MG tablet Take 500 mg by mouth 2 (two) times daily.     [name] MAGNESIUM PO Take 1 tablet by mouth  daily.    . rosuvastatin (CRESTOR) 20 MG tablet Take 1 tablet (20 mg total) by mouth daily. 30 tablet 0  . traMADol (ULTRAM-ER) 100 MG 24 hr tablet Take 1 -3 tablets a day for pain. Start with one and slowly increase as needed. 90 tablet 2   No current facility-administered medications for this visit.    Allergies as of 02/07/2016 - Review Complete 02/07/2016  Allergen Reaction Noted  . Zolpidem tartrate Other (See Comments) 05/29/2011  . Codeine Nausea And Vomiting 05/29/2011  . Penicillins Other (See Comments) 05/29/2011    Vitals: BP 178/88 mmHg  Pulse 82  Resp 18  Ht 5' 3" (1.6 m)  Wt 254 lb (115.214 kg)  BMI 45.01 kg/m2 Last  Weight:  Wt Readings from Last 1 Encounters:  02/07/16 254 lb (115.214 kg)   Last Height:   Ht Readings from Last 1 Encounters:  02/07/16 5' 3" (1.6 m)   Physical exam: Exam: Gen: NAD, conversant, well nourised, obese, well groomed                     CV: RRR, no MRG. No Carotid Bruits. No peripheral edema, warm, nontender Eyes: Conjunctivae clear without exudates or hemorrhage  Neuro: Detailed Neurologic Exam  Speech:    Speech is normal; fluent and spontaneous with normal comprehension.  Cognition:    The patient is oriented to person, place, and time;     recent and remote memory intact;     language fluent;     normal attention, concentration,     fund of knowledge Cranial Nerves:    The pupils are equal, round, and reactive to light. The fundi are normal and spontaneous venous pulsations are present. Visual fields are full to finger confrontation. Extraocular movements are intact. Trigeminal sensation is intact and the muscles of mastication are normal. The face is symmetric. The palate elevates in the midline. Hearing intact. Voice is normal. Shoulder shrug is normal. The tongue has normal motion without fasciculations.   Coordination:    Normal finger to nose and heel to shin. Normal rapid alternating movements.   Gait:    Heel-toe and tandem gait are normal.   Motor Observation:    No asymmetry, no atrophy, and no involuntary movements noted. Tone:    Normal muscle tone.    Posture:    Posture is normal. normal erect    Strength:    Strength is V/V in the upper and lower limbs.      Sensation: intact to LT     Reflex Exam:  DTR's:    Deep tendon reflexes in the upper and lower extremities are normal bilaterally.   Toes:    The toes are downgoing bilaterally.   Clonus:    Clonus is absent.       Assessment/Plan:  ASSESSMENT/PLAN Ms. [name] [name] is a 65 y.o. female with history of diabetes mellitus, hypertension, and obesity presenting with  right-sided numbness. She did not receive IV t-PA due to late presentation.  Stroke: Bilateral infarcts possibly embolic from an unknown source. However may both be from small vessel disease due to HTN, diabetes and HLD.   Resultant Numbness irght face and right digits 3-5.  MRI Acute lacunar infarct in the left thalamus. Possible subacute lacunar infarct in the right splenium of the corpus callosum  MRA - Severe proximal left M2 MCA stenoses.  Carotid Doppler - Unremarkable  2D Echo - EF 55-60%. No cardiac source of emboli identified.  LDL 153  HgbA1c 6.2, controlled goal < 7  Recommend 30-day outpatient holter monitor and then possible TEE  No antithrombotic prior to admission, now on aspirin 325 mg daily  Patient counseled to be compliant with her antithrombotic medications  Ongoing aggressive stroke risk factor management  Home meds: No lipid lowering medications prior to admission  LDL 153, goal < 70  Now on Lipitor 40 mg daily  Obesity, Body mass index is 44.83 kg/(m^2), advised on diet  Family hx stroke   Other Active Problems  Mass involving the sella, left cavernous sinus, and left sphenoid sinus, suspicious for invasive pituitary macroadenoma. Further evaluation with pituitary protocol MRI (without and with contrast) is ordered and referral to ENT:  NEUROSURGERY REVIEWED AND FEELS THIS IS A MASS OF THE SPHENOID SINUS THAT HAS INVADED THE CAVERNOUS SINUS,NOT PITUITARY TUMOR   I had a long d/w patient about her recent stroke, risk for recurrent stroke/TIAs, personally independently reviewed imaging studies and stroke evaluation results and answered questions.Continue ASA and statin for secondary stroke prevention and maintain strict control of hypertension with blood pressure goal below 130/90, diabetes with hemoglobin A1c goal below 6.5% and lipids with LDL cholesterol goal below 70 mg/dL. I also advised the patient to eat a healthy diet with plenty of whole  grains, cereals, fruits and vegetables, exercise regularly and maintain ideal body weight .  Cc: Cammie Fulp   Sarina Ill, MD  [facility] [address] [place], [place] [address]  Phone [phone] Fax [phone]  A total of 45 minutes was spent face-to-face with this patient. Over half this time was spent on counseling patient on the lacunar strokes and pituitary tumor diagnosis and different diagnostic and therapeutic options available.

## 2016-02-07 NOTE — Patient Instructions (Signed)
Overall you are doing fairly well but I do want to suggest a few things today:   Remember to drink plenty of fluid, eat healthy meals and do not skip any meals. Try to eat protein with a every meal and eat a healthy snack such as fruit or nuts in between meals. Try to keep a regular sleep-wake schedule and try to exercise daily, particularly in the form of walking, 20-30 minutes a day, if you can.   As far as your medications are concerned, I would like to suggest: Tramadol start with one daily. Max 333m daily. Watch for sedation and confusion.  As far as diagnostic testing: MRi of the brain  I would like to see you back as needed, sooner if we need to. Please call [facility] any interim questions, concerns, problems, updates or refill requests.   Our phone number is [phone] We also have an after hours call service for urgent matters and there is a physician on-call for urgent questions. For any emergencies you know to call 911 or go to the nearest emergency room

## 2016-02-10 ENCOUNTER — Ambulatory Visit (INDEPENDENT_AMBULATORY_CARE_PROVIDER_SITE_OTHER): Payer: Federal, State, Local not specified - PPO

## 2016-02-10 DIAGNOSIS — I4891 Unspecified atrial fibrillation: Secondary | ICD-10-CM | POA: Diagnosis not present

## 2016-02-10 DIAGNOSIS — I6309 Cerebral infarction due to thrombosis of other precerebral artery: Secondary | ICD-10-CM | POA: Diagnosis not present

## 2016-02-10 DIAGNOSIS — I639 Cerebral infarction, unspecified: Secondary | ICD-10-CM | POA: Diagnosis not present

## 2016-02-12 ENCOUNTER — Ambulatory Visit
Admission: RE | Admit: 2016-02-12 | Discharge: 2016-02-12 | Disposition: A | Discharge status: Home / Self Care | Payer: Federal, State, Local not specified - PPO | Source: Ambulatory Visit | Attending: Neurology | Admitting: Neurology

## 2016-02-12 DIAGNOSIS — D497 Neoplasm of unspecified behavior of endocrine glands and other parts of nervous system: Secondary | ICD-10-CM | POA: Diagnosis not present

## 2016-02-12 MED ORDER — GADOBENATE DIMEGLUMINE 529 MG/ML IV SOLN
15.0000 mL | Freq: Once | INTRAVENOUS | Status: AC | PRN
  Administered 2016-02-12: 15 mL via INTRAVENOUS

## 2016-02-14 ENCOUNTER — Telehealth: Payer: Self-pay | Admitting: *Deleted

## 2016-02-14 NOTE — Telephone Encounter (Signed)
I think either one would be fine, Dr. Trenton Gammon is excellent as well! thanks

## 2016-02-14 NOTE — Telephone Encounter (Signed)
-----  Message from Melvenia Beam, MD sent at 02/14/2016  8:08 AM EDT ----- This MRI again shows a mass that measures 1.6x1.8x1.7cm (AP x trans x SI). 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 The strokes look stable, Ensure she has an appointment with neurosurgery please, they should have everything they need with this MRI. thanks

## 2016-02-14 NOTE — Telephone Encounter (Signed)
Dr Jaynee Eagles- FYI Called and spoke to pt about MRI results per Dr Jaynee Eagles note. She verbalized understanding. She stated [facility] called her and offered appt with Dr Trenton Gammon. She stated she thought Dr Jaynee Eagles wanted her to see Dr Sherwood Gambler, so she told [place] this. She is waiting for a call back from their office. They told her they were going to give the notes to Dr Sherwood Gambler to review. I advised I will relay message to Dr Jaynee Eagles and for her to call if she has further questions or concerns. She verbalized understanding.

## 2016-02-18 ENCOUNTER — Other Ambulatory Visit: Payer: Self-pay | Admitting: Neurology

## 2016-02-18 ENCOUNTER — Telehealth: Payer: Self-pay | Admitting: Neurology

## 2016-02-18 DIAGNOSIS — R220 Localized swelling, mass and lump, head: Principal | ICD-10-CM

## 2016-02-18 DIAGNOSIS — J3489 Other specified disorders of nose and nasal sinuses: Secondary | ICD-10-CM

## 2016-02-18 NOTE — Telephone Encounter (Signed)
Dr Jaynee Eagles- see message below Called pt back. She wanted to make sure Dr Jaynee Eagles agreed with the plan. She was a little worried as to why Dr Sherwood Gambler wants to refer her to ENT. I advised I will give Dr Jaynee Eagles the message. She states if Dr Jaynee Eagles wants to wait to get his OV note and review that to see if she agrees, that is fine. She just wants a call either way.

## 2016-02-18 NOTE — Telephone Encounter (Signed)
I spoke to neurosurgery, they do not think this is a pituitary mass. They think this is a sphenoid sinus mass that is extending into the sinuses behind the eyes and therefore needs to go to ENT. Spoke to patient. Will refer to [facility]NT.

## 2016-02-18 NOTE — Telephone Encounter (Signed)
Message For: OFFICE               Taken 30-MAY-17 at  9:47AM by [facility] ------------------------------------------------------------  [name] [name]              CID  [id number]   Patient  SAME                  Pt's Dr  [facility]         Area Code  336  Phone#  [address] [place] TO ENT, PLS C/B         Disp:Y/N  N  If Y = C/B If No Response In 50mnutes  ============================================================

## 2016-02-20 ENCOUNTER — Other Ambulatory Visit: Payer: Self-pay | Admitting: Otolaryngology

## 2016-02-20 ENCOUNTER — Telehealth: Payer: Self-pay | Admitting: *Deleted

## 2016-02-20 NOTE — Telephone Encounter (Signed)
Called and LVM for pt. Relayed per Dr Jaynee Eagles that ok to drop down to 15m for aspirin. Gave [facility] phone number if she has further questions.

## 2016-02-20 NOTE — Telephone Encounter (Signed)
Pt called said she saw ENT today and has been scheduled for surgery 6/9.Dr Louis Matte wants her to stop the aspirin prior to surgery, she told him she knew Dr Jaynee Eagles would not want her to stop taking it. He suggested she drop down to 21m/day. Is this ok?

## 2016-02-20 NOTE — Telephone Encounter (Signed)
Left patient a message relaying I faxed copies of all records and reports and if she had MRI disc she could take with her.

## 2016-02-20 NOTE — Telephone Encounter (Signed)
That is fine, thanks

## 2016-02-20 NOTE — Telephone Encounter (Signed)
Message For: [facility]                  Taken  1-JUN-17 at 11:26AM by JPO ------------------------------------------------------------ [name] [name]             CID [id number]  Patient SAME                 Pt's Dr Jaynee Eagles        Area Code 336 Phone# [phone] * [phone]      XL [id number] BZX YDSW VTVN OF MRI REPORT FOR APPT                                                         Disp:Y/N N If Y = C/B If No Response In 9mnutes ============================================================

## 2016-02-20 NOTE — Telephone Encounter (Signed)
Dr Jaynee Eagles- please advise

## 2016-02-24 ENCOUNTER — Telehealth: Payer: Self-pay | Admitting: *Deleted

## 2016-02-24 ENCOUNTER — Encounter: Payer: Self-pay | Admitting: *Deleted

## 2016-02-24 NOTE — H&P (Signed)
02/28/16 7:17 AM  [name] [name]  PREOPERATIVE HISTORY AND PHYSICAL  CHIEF COMPLAINT: left sphenoid mass  HISTORY: This is a 65 year old who presents with left sphenoid mass. She now presents for endoscopic sinus surgery and excisional biopsy.  Dr. Simeon Craft, [place] Dust has discussed the risks, benefits, and alternatives of this procedure. The patient understands the risks and would like to proceed with the procedure. The chances of success of the procedure are >50% and the patient understands this. I personally performed an examination of the patient within 24 hours of the procedure.  PAST MEDICAL HISTORY: Past Medical History  Diagnosis Date  . Diabetes mellitus without complication ([place])   . Hypertension   . Obesity     hx gastric bypass  . Stroke [facility])     PAST SURGICAL HISTORY: Past Surgical History  Procedure Laterality Date  . Bariatric surgery  11/24/10    RNY  . Joint replacement    . Tonsillectomy    . Abdominal hysterectomy    . Total hip arthroplasty      MEDICATIONS: No current facility-administered medications on file prior to encounter.   Current Outpatient Prescriptions on File Prior to Encounter  Medication Sig Dispense Refill  . aspirin 325 MG tablet Take 1 tablet (325 mg total) by mouth daily. 30 tablet 0  . beta carotene w/minerals (OCUVITE) tablet Take 1 tablet by mouth daily.    . Coenzyme Q10 (CO Q 10 PO) Take 1 tablet by mouth daily.    [name] etodolac (LODINE) 500 MG tablet Take 500 mg by mouth 2 (two) times daily.     [name] MAGNESIUM PO Take 1 tablet by mouth daily.    . rosuvastatin (CRESTOR) 20 MG tablet Take 1 tablet (20 mg total) by mouth daily. 30 tablet 0  . traMADol (ULTRAM-ER) 100 MG 24 hr tablet Take 1 -3 tablets a day for pain. Start with one and slowly increase as needed. 90 tablet 2    ALLERGIES: Allergies  Allergen Reactions  . Zolpidem Tartrate Other (See Comments)    Night terrors.  . Codeine Nausea And Vomiting  . Penicillins Other (See  Comments)    Made nose run uncontrollably   SOCIAL HISTORY: Social History   Social History  . Marital Status: Single    Spouse Name: N/A  . Number of Children: 0  . Years of Education: college   Occupational History  . Retired    Social History Main Topics  . Smoking status: Former Research scientist (life sciences)  . Smokeless tobacco: Not on file     Comment: Quit Dec 2007  . Alcohol Use: 0.0 oz/week    0 Standard drinks or equivalent per week     Comment: Rare  . Drug Use: No  . Sexual Activity: Not on file   Other Topics Concern  . Not on file   Social History Narrative   Drinks 3 cups of coffee a day     FAMILY HISTORY: Family History  Problem Relation Age of Onset  . Prostate cancer Father   . Lymphoma Father   . Congestive Heart Failure Maternal Grandmother   . Lung cancer Maternal Grandfather     REVIEW OF SYSTEMS:  HEENT: headaches, otherwise negative x 12 systems except per HPI   PHYSICAL EXAM:  GENERAL:  NAD VITAL SIGNS:  Filed Vitals:   02/28/16 0637  BP: 122/64  Pulse: 87  Temp: 98.3 F (36.8 C)  Resp: 18   SKIN:  Warm, dry HEENT:  Oral cavity clear, nasal  cavity patent NECK:  supple LYMPH:  No LAD palpated  ABDOMEN:  soft MUSCULOSKELETAL: normal strength PSYCH:  Normal affect NEUROLOGIC:  CN 2-12 intact and symmetric  DIAGNOSTIC STUDIES: MRI and CT show opacified left sphenoid, empty sella, and possible left cavernous sinus invasion by left sphenoid mass, pituitary appears flattened on the floor of the sella with partial empty sella/fluid in the sella and the mass appears separate from the pituitary on the sagittal MRI.  ASSESSMENT AND PLAN: Plan to proceed with left sinus surgery and endoscopic biopsy/excision of left sphenoid mass and possible repair of CSF leak if needed. Patient understands the risks, benefits, and alternatives. Left side marked. [name] [name]  02/28/16 7:19 AM

## 2016-02-24 NOTE — Telephone Encounter (Signed)
Dr Jaynee Eagles- FYI Called pt. She is a pt at [facility] for dentistry. I advised I faxed over clearance from neurological standpoint for her to have dental procedure. Fax: [phone]. Received fax confirmation. She verbalized understanding and states she had you routine cleaning today also.

## 2016-02-26 ENCOUNTER — Encounter (HOSPITAL_COMMUNITY): Payer: Self-pay

## 2016-02-26 ENCOUNTER — Encounter (HOSPITAL_COMMUNITY)
Admission: RE | Admit: 2016-02-26 | Discharge: 2016-02-26 | Disposition: A | Discharge status: Home / Self Care | Payer: Federal, State, Local not specified - PPO | Source: Ambulatory Visit | Attending: Otolaryngology | Admitting: Otolaryngology

## 2016-02-26 DIAGNOSIS — Z9884 Bariatric surgery status: Secondary | ICD-10-CM | POA: Diagnosis not present

## 2016-02-26 DIAGNOSIS — I10 Essential (primary) hypertension: Secondary | ICD-10-CM | POA: Diagnosis not present

## 2016-02-26 DIAGNOSIS — D352 Benign neoplasm of pituitary gland: Secondary | ICD-10-CM | POA: Diagnosis not present

## 2016-02-26 DIAGNOSIS — E119 Type 2 diabetes mellitus without complications: Secondary | ICD-10-CM | POA: Diagnosis not present

## 2016-02-26 DIAGNOSIS — Z7982 Long term (current) use of aspirin: Secondary | ICD-10-CM | POA: Diagnosis not present

## 2016-02-26 DIAGNOSIS — Z6841 Body Mass Index (BMI) 40.0 and over, adult: Secondary | ICD-10-CM | POA: Diagnosis not present

## 2016-02-26 DIAGNOSIS — Z87891 Personal history of nicotine dependence: Secondary | ICD-10-CM | POA: Diagnosis not present

## 2016-02-26 DIAGNOSIS — E669 Obesity, unspecified: Secondary | ICD-10-CM | POA: Diagnosis not present

## 2016-02-26 DIAGNOSIS — Z79899 Other long term (current) drug therapy: Secondary | ICD-10-CM | POA: Diagnosis not present

## 2016-02-26 DIAGNOSIS — Z96649 Presence of unspecified artificial hip joint: Secondary | ICD-10-CM | POA: Diagnosis not present

## 2016-02-26 DIAGNOSIS — G4730 Sleep apnea, unspecified: Secondary | ICD-10-CM | POA: Diagnosis not present

## 2016-02-26 DIAGNOSIS — Z8673 Personal history of transient ischemic attack (TIA), and cerebral infarction without residual deficits: Secondary | ICD-10-CM | POA: Diagnosis not present

## 2016-02-26 DIAGNOSIS — R220 Localized swelling, mass and lump, head: Secondary | ICD-10-CM | POA: Diagnosis present

## 2016-02-26 HISTORY — DX: Unspecified osteoarthritis, unspecified site: M19.90

## 2016-02-26 HISTORY — DX: Sleep apnea, unspecified: G47.30

## 2016-02-26 LAB — BASIC METABOLIC PANEL
ANION GAP: 6 (ref 5–15)
BUN: 18 mg/dL (ref 6–20)
CALCIUM: 8.9 mg/dL (ref 8.9–10.3)
CO2: 27 mmol/L (ref 22–32)
Chloride: 106 mmol/L (ref 101–111)
Creatinine, Ser: 0.96 mg/dL (ref 0.44–1.00)
GFR calc Af Amer: >60 mL/min (ref >60)
GFR calc non Af Amer: >60 mL/min (ref >60)
GLUCOSE: 192 mg/dL — AB (ref 65–99)
Potassium: 4 mmol/L (ref 3.5–5.1)
Sodium: 139 mmol/L (ref 135–145)

## 2016-02-26 LAB — CBC
HEMATOCRIT: 37.4 % (ref 36.0–46.0)
HEMOGLOBIN: 11.9 g/dL — AB (ref 12.0–15.0)
MCH: 25.6 pg — AB (ref 26.0–34.0)
MCHC: 31.8 g/dL (ref 30.0–36.0)
MCV: 80.6 fL (ref 78.0–100.0)
Platelets: 268 10*3/uL (ref 150–400)
RBC: 4.64 MIL/uL (ref 3.87–5.11)
RDW: 14.7 % (ref 11.5–15.5)
WBC: 10.9 10*3/uL — ABNORMAL HIGH (ref 4.0–10.5)

## 2016-02-26 LAB — GLUCOSE, CAPILLARY: Glucose-Capillary: 220 mg/dL — ABNORMAL HIGH (ref 65–99)

## 2016-02-26 NOTE — Pre-Procedure Instructions (Addendum)
ELOYSE CAUSEY  02/26/2016      [id number] [place]  [id number]: [phone]: [phone]   Your procedure is scheduled on Friday June 9th  [facility]  Call this number if you have problems the morning of surgery:  [phone]  Remember:  Do not eat food or drink liquids after midnight.  Take these medicines the morning of surgery with A SIP OF WATER: amlodipine (norvasc), pain pill if needed, protonix Take the 81 mg aspirin as instructed by your doctor. Stop taking anti- inflammatories including your lodine, and Nsaids (such as ibuprofen, aleve, motrin, naproxen, advil), and all vitamin and herbal supplements including your CO Q-10   Do not wear jewelry, make-up or nail polish.  Do not wear lotions, powders, or perfumes.  You may wear deodorant.  Do not shave 48 hours prior to surgery.    Do not bring valuables to the hospital.  [facility] or [facility].  Contacts, dentures or bridgework may not be worn into surgery.    Special instructions: Shower with CHG as instructed the night before and morning of surgery  Please read over the following fact sheets that you were given. Coughing and Deep Breathing and Surgical Site Infection Prevention,Shower instructions

## 2016-02-26 NOTE — Progress Notes (Signed)
Pt sees Dr Sarina Ill with [facility] neurology after having two TIA's in May.  She is currently wearing a holter monitor as she states the doctor feels she might have A-fib. She was also instructed not to stop aspirin before surgery so she was instructed to replace her 366m aspirin with 81 mg aspirin for 5 days prior to surgery.  [facility](last [facility] and any cardiac studies/sleep study requested)  Stress Test 12/31/99 ECHO 01/31/16 Cath 03/20/10 (media tab and letters) EKG 02/01/16 CXR 01/31/16  Pt has current A1C 01/31/16 (6.2)  Sleep study done >5 years ago. No longer uses the CPAP since bariatric surgery

## 2016-02-27 ENCOUNTER — Other Ambulatory Visit: Payer: Self-pay | Admitting: Orthopedic Surgery

## 2016-02-27 ENCOUNTER — Encounter (HOSPITAL_COMMUNITY): Payer: Self-pay

## 2016-02-27 NOTE — Progress Notes (Signed)
Anesthesia Chart Review: Patient is a 65 year old female scheduled for left sinus surgery with possible abdominal fat graft on 02/28/16 by Dr. Simeon Craft. Dx: Left sphenoid mass.  Patient was hospitalized on 01/31/16-02/01/16 with right facial and hand numbness. Work-up revealed acute left thalamic stroke and possible subacute right splenium infarct 01/31/16. She was out the window for TPA. MRA showed  proximal left M2 MCA stenosis. 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 Echo and carotid duplex showed no source of embolus. 30 day Holter monitor was ordered and is on-going (placed 02/10/16). She was seen by neurologist Dr. Jaynee Eagles who started her on ASA and referred her to neurosurgery for further evaluation of a pituitary mass. She was seen by Dr. Sherwood Gambler who thought it was actually a sphenoid mass extending into the sinuses behind the eyes and would therefore need referral to ENT who recommended the above procedure.   Other history includes former smoker, HTN, headaches, CAD (non-osbtructive by 2011 cath), DM2 (diet controlled), OSA (no CPAP since bariatric surgery), CVA 01/2016, morbid obesity s/p bariatric surgery (laparoscopic Roux-en-Y gastric bypass) '12, hysterectomy, tonsillectomy, right THA '08, bilateral TKA '01. BMI 45.50 consistent with morbid obesity.   PCP is Dr. Chapman Fitch with [facility]. Neurologist is Dr. Sarina Ill with [facility]. He referred patient to ENT. On 02/20/16, patient notified Dr. Jaynee Eagles that ENT surgery was planned on 02/28/16. She stated that Dr. Simeon Craft had wanted her to stop ASA prior to surgery, but since she knew Dr. Jaynee Eagles would not want her to stop it, Dr.Gore had asked if it would be okay to at least decrease the dose to 22m/day. Dr. AJaynee Eaglesfelt that would be okay for surgery.  Meds include amlodipine, ASA (decreased to 864mfive days before surgery), Ocuvite,  etodolac, Noroc, losartan, Mag-Ox, Protonix, Crestor, tramadol.   PAT Vitals: BP 124/65, HR 89, RR 18, T 36.2C, O2 sat 96%. CBG 220.  01/31/16 EKG: ST at 100 bpm, borderline LAD, anterior infarct (old), baseline wanderer diffuse leads.  01/31/16 Echo: Impressions: - Normal LV size with moderate LV hypertrophy. EF 55-60%. Grade 1 diastolic dysfunction. Normal RV size and systolic function. No significant valvular abnormalities.  30 day Holter monitor is in process (patient still wearing).  03/20/10 LHC (Dr. MiSherren Mochaseen by Dr. KlCaryl Comesor chest pain with out-patient cardiac cath recommended): LVF is normal with EF 65%. No MR. Widely patent LM that divides into the LAD and LCX. LAD with minor irregularities proximally. Minor luminal irregularities in the mid LAD. Minor plaque in the DIAG. No significant stenosis in the CX system. Mid RCA with 40-50% stenosis followed by a second 20-30% stenosis. 30-50% distal RCA.  Assessment: Non-obstructive CAD. Normal LVF. Continue ongoing risk reduction measures under the care of her primary care physician.  01/31/16 Carotid U/S: Summary: - The vertebral arteries appear patent with antegrade flow. - Findings consistent with 1-39 percent stenosis involving the  right internal carotid artery and the left internal carotid  artery.  5/242/17 MRI Brain: IMPRESSION: Abnormal MRI brain (with and without) demonstrating: 1. Mass extending from the floor of the sella, projecting anteriorly into the sphenoid sinus, towards the left orbital apex, and towards the left side into the left cavernous sinus. The left cavernous carotid artery is displaced laterally. No mass effect superiorly upon the optic chiasm. On post contrast views, the mass has heterogenous enhancement with a hypoenhancing component towards the left side. The  mass measures 1.6x1.8x1.7cm (AP x trans x SI). 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  2. Moderate periventricular and  subcortical and pontine chronic small vessel ischemic disease.  3. Subtle subacute-chronic right splenium and left thalamic ischemic infarctions appear stable. 4. No acute findings. 5. No significant change from MRI on 01/31/16.  01/31/16 MRI/MRA Head: IMPRESSION: 1. Acute lacunar infarct in the left thalamus. 2. Possible subacute lacunar infarct in the right splenium of the corpus callosum. 3. Moderate chronic small vessel ischemic disease. 4. Mass involving the sella, left cavernous sinus, and left sphenoid sinus, suspicious for invasive pituitary macroadenoma. Further evaluation with nonemergent pituitary protocol MRI (without and with contrast) is recommended. 5. No major intracranial arterial occlusion or significant proximal posterior circulation stenosis. 6. Severe proximal left M2 MCA stenoses.  01/31/16 CXR: IMPRESSION: No active cardiopulmonary disease.  Preoperative labs noted. Cr 0.96, glucse 192, WBC 10.9, H/H 11.9/37.4, PLT 268. A1c 6.2 on 01/31/16. TSH 2.070 on 02/01/16.   Patient with recent CVA with finding of sphenoid mass. ENT has recommended surgery. Stroke risk would be higher within the first twelve weeks, but neurologist Dr. Jaynee Eagles was notified of timing of surgery and agreed to decreased ASA to 81 mg/day.  I have updated anesthesiologist Dr. Oletta Lamas. Further evaluation on the day of surgery.   George Hugh [facility]/Anesthesiology Phone [phone] 02/27/2016 2:18 PM

## 2016-02-28 ENCOUNTER — Encounter (HOSPITAL_COMMUNITY): Payer: Self-pay | Admitting: Certified Registered Nurse Anesthetist

## 2016-02-28 ENCOUNTER — Ambulatory Visit (HOSPITAL_COMMUNITY)
Admission: RE | Admit: 2016-02-28 | Discharge: 2016-02-28 | Disposition: A | Discharge status: Home / Self Care | Payer: Federal, State, Local not specified - PPO | Source: Ambulatory Visit | Attending: Otolaryngology | Admitting: Otolaryngology

## 2016-02-28 ENCOUNTER — Ambulatory Visit (HOSPITAL_COMMUNITY): Payer: Federal, State, Local not specified - PPO | Admitting: Vascular Surgery

## 2016-02-28 ENCOUNTER — Ambulatory Visit (HOSPITAL_COMMUNITY): Payer: Federal, State, Local not specified - PPO | Admitting: Certified Registered Nurse Anesthetist

## 2016-02-28 ENCOUNTER — Encounter (HOSPITAL_COMMUNITY)
Admission: RE | Disposition: A | Discharge status: Home / Self Care | Payer: Self-pay | Source: Ambulatory Visit | Attending: Otolaryngology

## 2016-02-28 DIAGNOSIS — Z96649 Presence of unspecified artificial hip joint: Secondary | ICD-10-CM | POA: Insufficient documentation

## 2016-02-28 DIAGNOSIS — Z7982 Long term (current) use of aspirin: Secondary | ICD-10-CM | POA: Insufficient documentation

## 2016-02-28 DIAGNOSIS — G4730 Sleep apnea, unspecified: Secondary | ICD-10-CM | POA: Insufficient documentation

## 2016-02-28 DIAGNOSIS — Z9884 Bariatric surgery status: Secondary | ICD-10-CM | POA: Insufficient documentation

## 2016-02-28 DIAGNOSIS — Z79899 Other long term (current) drug therapy: Secondary | ICD-10-CM | POA: Insufficient documentation

## 2016-02-28 DIAGNOSIS — Z8673 Personal history of transient ischemic attack (TIA), and cerebral infarction without residual deficits: Secondary | ICD-10-CM | POA: Insufficient documentation

## 2016-02-28 DIAGNOSIS — D352 Benign neoplasm of pituitary gland: Secondary | ICD-10-CM | POA: Diagnosis not present

## 2016-02-28 DIAGNOSIS — I10 Essential (primary) hypertension: Secondary | ICD-10-CM | POA: Insufficient documentation

## 2016-02-28 DIAGNOSIS — E119 Type 2 diabetes mellitus without complications: Secondary | ICD-10-CM | POA: Insufficient documentation

## 2016-02-28 DIAGNOSIS — Z6841 Body Mass Index (BMI) 40.0 and over, adult: Secondary | ICD-10-CM | POA: Insufficient documentation

## 2016-02-28 DIAGNOSIS — Z87891 Personal history of nicotine dependence: Secondary | ICD-10-CM | POA: Insufficient documentation

## 2016-02-28 DIAGNOSIS — E669 Obesity, unspecified: Secondary | ICD-10-CM | POA: Insufficient documentation

## 2016-02-28 HISTORY — PX: SINUS ENDO W/FUSION: SHX777

## 2016-02-28 LAB — URINALYSIS, ROUTINE W REFLEX MICROSCOPIC
GLUCOSE, UA: NEGATIVE mg/dL
HGB URINE DIPSTICK: NEGATIVE
KETONES UR: NEGATIVE mg/dL
Leukocytes, UA: NEGATIVE
Nitrite: NEGATIVE
PROTEIN: NEGATIVE mg/dL
Specific Gravity, Urine: 1.02 (ref 1.005–1.030)
pH: 6 (ref 5.0–8.0)

## 2016-02-28 LAB — DIFFERENTIAL
BASOS ABS: 0 10*3/uL (ref 0.0–0.1)
BASOS PCT: 0 %
EOS ABS: 0.2 10*3/uL (ref 0.0–0.7)
Eosinophils Relative: 3 %
LYMPHS ABS: 1.5 10*3/uL (ref 0.7–4.0)
LYMPHS PCT: 19 %
MONO ABS: 0.7 10*3/uL (ref 0.1–1.0)
Monocytes Relative: 9 %
NEUTROS PCT: 70 %
Neutro Abs: 5.6 10*3/uL (ref 1.7–7.7)

## 2016-02-28 LAB — TYPE AND SCREEN
ABO/RH(D): A POS
ANTIBODY SCREEN: NEGATIVE

## 2016-02-28 LAB — APTT: aPTT: 26 seconds (ref 24–37)

## 2016-02-28 LAB — GLUCOSE, CAPILLARY
GLUCOSE-CAPILLARY: 108 mg/dL — AB (ref 65–99)
GLUCOSE-CAPILLARY: 165 mg/dL — AB (ref 65–99)

## 2016-02-28 LAB — PROTIME-INR
INR: 1.06 (ref 0.00–1.49)
PROTHROMBIN TIME: 14 s (ref 11.6–15.2)

## 2016-02-28 SURGERY — SINUS SURGERY, ENDOSCOPIC, USING COMPUTER-ASSISTED NAVIGATION
Anesthesia: General | Laterality: Left

## 2016-02-28 MED ORDER — ONDANSETRON HCL 4 MG/2ML IJ SOLN
INTRAMUSCULAR | Status: DC | PRN
  Administered 2016-02-28: 4 mg via INTRAVENOUS

## 2016-02-28 MED ORDER — DEXAMETHASONE SODIUM PHOSPHATE 10 MG/ML IJ SOLN
10.0000 mg | Freq: Once | INTRAMUSCULAR | Status: AC
  Administered 2016-02-28: 10 mg via INTRAVENOUS

## 2016-02-28 MED ORDER — SUCCINYLCHOLINE CHLORIDE 200 MG/10ML IV SOSY
PREFILLED_SYRINGE | INTRAVENOUS | Status: AC
  Filled 2016-02-28: qty 10

## 2016-02-28 MED ORDER — MUPIROCIN CALCIUM 2 % NA OINT
TOPICAL_OINTMENT | NASAL | Status: DC | PRN
  Administered 2016-02-28: 1 via NASAL

## 2016-02-28 MED ORDER — SUGAMMADEX SODIUM 200 MG/2ML IV SOLN
INTRAVENOUS | Status: DC | PRN
  Administered 2016-02-28: 400 mg via INTRAVENOUS

## 2016-02-28 MED ORDER — VANCOMYCIN HCL IN DEXTROSE 1-5 GM/200ML-% IV SOLN
1000.0000 mg | INTRAVENOUS | Status: AC
  Administered 2016-02-28: 1000 mg via INTRAVENOUS

## 2016-02-28 MED ORDER — FENTANYL CITRATE (PF) 250 MCG/5ML IJ SOLN
INTRAMUSCULAR | Status: AC
  Filled 2016-02-28: qty 5

## 2016-02-28 MED ORDER — PHENYLEPHRINE HCL 10 MG/ML IJ SOLN
INTRAMUSCULAR | Status: DC | PRN
  Administered 2016-02-28 (×4): 80 ug via INTRAVENOUS

## 2016-02-28 MED ORDER — CLINDAMYCIN PHOSPHATE 600 MG/50ML IV SOLN: 600.0000 mg | INTRAVENOUS | Status: DC

## 2016-02-28 MED ORDER — LIDOCAINE 2% (20 MG/ML) 5 ML SYRINGE
INTRAMUSCULAR | Status: AC
  Filled 2016-02-28: qty 5

## 2016-02-28 MED ORDER — PHENYLEPHRINE 40 MCG/ML (10ML) SYRINGE FOR IV PUSH (FOR BLOOD PRESSURE SUPPORT)
PREFILLED_SYRINGE | INTRAVENOUS | Status: AC
  Filled 2016-02-28: qty 10

## 2016-02-28 MED ORDER — PROPOFOL 10 MG/ML IV BOLUS
INTRAVENOUS | Status: AC
  Filled 2016-02-28: qty 20

## 2016-02-28 MED ORDER — SUCCINYLCHOLINE CHLORIDE 20 MG/ML IJ SOLN
INTRAMUSCULAR | Status: DC | PRN
  Administered 2016-02-28: 120 mg via INTRAVENOUS

## 2016-02-28 MED ORDER — METOCLOPRAMIDE HCL 5 MG/ML IJ SOLN
INTRAMUSCULAR | Status: AC
  Filled 2016-02-28: qty 2

## 2016-02-28 MED ORDER — FENTANYL CITRATE (PF) 100 MCG/2ML IJ SOLN: 25.0000 ug | INTRAMUSCULAR | Status: DC | PRN

## 2016-02-28 MED ORDER — PHENYLEPHRINE HCL 10 MG/ML IJ SOLN
10.0000 mg | INTRAVENOUS | Status: DC | PRN
  Administered 2016-02-28: 40 ug/min via INTRAVENOUS

## 2016-02-28 MED ORDER — OXYMETAZOLINE HCL 0.05 % NA SOLN
NASAL | Status: AC
  Filled 2016-02-28: qty 15

## 2016-02-28 MED ORDER — FENTANYL CITRATE (PF) 250 MCG/5ML IJ SOLN
INTRAMUSCULAR | Status: DC | PRN
  Administered 2016-02-28: 100 ug via INTRAVENOUS
  Administered 2016-02-28 (×2): 50 ug via INTRAVENOUS

## 2016-02-28 MED ORDER — PROPOFOL 10 MG/ML IV BOLUS
INTRAVENOUS | Status: DC | PRN
  Administered 2016-02-28: 180 mg via INTRAVENOUS

## 2016-02-28 MED ORDER — VANCOMYCIN HCL IN DEXTROSE 1-5 GM/200ML-% IV SOLN
INTRAVENOUS | Status: AC
  Filled 2016-02-28: qty 200

## 2016-02-28 MED ORDER — LIDOCAINE-EPINEPHRINE 1 %-1:100000 IJ SOLN
INTRAMUSCULAR | Status: DC | PRN
  Administered 2016-02-28: 20 mL

## 2016-02-28 MED ORDER — LIDOCAINE HCL (CARDIAC) 20 MG/ML IV SOLN
INTRAVENOUS | Status: DC | PRN
Start: 2016-02-28 — End: 2016-02-28
  Administered 2016-02-28: 80 mg via INTRAVENOUS

## 2016-02-28 MED ORDER — MIDAZOLAM HCL 2 MG/2ML IJ SOLN
INTRAMUSCULAR | Status: AC
  Filled 2016-02-28: qty 2

## 2016-02-28 MED ORDER — MIDAZOLAM HCL 5 MG/5ML IJ SOLN
INTRAMUSCULAR | Status: DC | PRN
  Administered 2016-02-28: 2 mg via INTRAVENOUS

## 2016-02-28 MED ORDER — LACTATED RINGERS IV SOLN
INTRAVENOUS | Status: DC | PRN
  Administered 2016-02-28 (×2): via INTRAVENOUS

## 2016-02-28 MED ORDER — SODIUM CHLORIDE 0.9 % IR SOLN
Status: DC | PRN
  Administered 2016-02-28: 1000 mL

## 2016-02-28 MED ORDER — ROCURONIUM BROMIDE 50 MG/5ML IV SOLN
INTRAVENOUS | Status: AC
  Filled 2016-02-28: qty 1

## 2016-02-28 MED ORDER — ONDANSETRON HCL 4 MG/2ML IJ SOLN: 4.0000 mg | Freq: Once | INTRAMUSCULAR | Status: DC | PRN

## 2016-02-28 MED ORDER — LIDOCAINE-EPINEPHRINE 1 %-1:100000 IJ SOLN
INTRAMUSCULAR | Status: AC
  Filled 2016-02-28: qty 1

## 2016-02-28 MED ORDER — ROCURONIUM BROMIDE 100 MG/10ML IV SOLN
INTRAVENOUS | Status: DC | PRN
  Administered 2016-02-28: 30 mg via INTRAVENOUS

## 2016-02-28 MED ORDER — METOCLOPRAMIDE HCL 5 MG/ML IJ SOLN
INTRAMUSCULAR | Status: DC | PRN
  Administered 2016-02-28: 10 mg via INTRAVENOUS

## 2016-02-28 MED ORDER — OXYMETAZOLINE HCL 0.05 % NA SOLN
NASAL | Status: DC | PRN
  Administered 2016-02-28: 1 via TOPICAL

## 2016-02-28 MED ORDER — CHLORHEXIDINE GLUCONATE 4 % EX LIQD: 60.0000 mL | Freq: Once | CUTANEOUS | Status: DC

## 2016-02-28 MED ORDER — MUPIROCIN 2 % EX OINT
TOPICAL_OINTMENT | CUTANEOUS | Status: AC
  Filled 2016-02-28: qty 22

## 2016-02-28 MED ORDER — DEXTROSE-NACL 5-0.45 % IV SOLN: INTRAVENOUS | Status: DC

## 2016-02-28 MED ORDER — BACITRACIN ZINC 500 UNIT/GM EX OINT
TOPICAL_OINTMENT | CUTANEOUS | Status: AC
  Filled 2016-02-28: qty 28.35

## 2016-02-28 MED ORDER — STERILE WATER FOR IRRIGATION IR SOLN
Status: DC | PRN
  Administered 2016-02-28: 1000 mL

## 2016-02-28 SURGICAL SUPPLY — 47 items
ALLODERM MED 3X7 (Tissue) ×3 IMPLANT
BLADE RAD60 ROTATE M4 4 5PK (BLADE) ×3 IMPLANT
BLADE ROTATE TRICUT 4X13 M4 (BLADE) ×3 IMPLANT
BLADE TRICUT ROTATE M4 4 5PK (BLADE) ×3 IMPLANT
CANISTER SUCTION 2500CC (MISCELLANEOUS) ×3 IMPLANT
CLEANER TIP ELECTROSURG 2X2 (MISCELLANEOUS) ×3 IMPLANT
COAGULATOR SUCT SWTCH 10FR 6 (ELECTROSURGICAL) ×3 IMPLANT
CONT SPEC 4OZ CLIKSEAL STRL BL (MISCELLANEOUS) ×9 IMPLANT
COVER SURGICAL LIGHT HANDLE (MISCELLANEOUS) ×3 IMPLANT
CRADLE DONUT ADULT HEAD (MISCELLANEOUS) ×3 IMPLANT
DECANTER SPIKE VIAL GLASS SM (MISCELLANEOUS) ×3 IMPLANT
DRAPE PROXIMA HALF (DRAPES) ×3 IMPLANT
DRESSING NASAL POPE 10X1.5X2.5 (GAUZE/BANDAGES/DRESSINGS) ×2 IMPLANT
DRSG NASAL POPE 10X1.5X2.5 (GAUZE/BANDAGES/DRESSINGS) ×3
ELECT COATED BLADE 2.86 ST (ELECTRODE) ×3 IMPLANT
ELECT REM PT RETURN 9FT ADLT (ELECTROSURGICAL) ×3
ELECTRODE REM PT RTRN 9FT ADLT (ELECTROSURGICAL) ×2 IMPLANT
FILTER ARTHROSCOPY CONVERTOR (FILTER) ×3 IMPLANT
GLOVE BIO SURGEON STRL SZ 6.5 (GLOVE) ×3 IMPLANT
GLOVE BIOGEL PI IND STRL 7.0 (GLOVE) ×2 IMPLANT
GLOVE BIOGEL PI INDICATOR 7.0 (GLOVE) ×1
GLOVE SURG SS PI 7.5 STRL IVOR (GLOVE) ×6 IMPLANT
GOWN STRL REUS W/ TWL LRG LVL3 (GOWN DISPOSABLE) ×6 IMPLANT
GOWN STRL REUS W/TWL LRG LVL3 (GOWN DISPOSABLE) ×3
KIT BASIN OR (CUSTOM PROCEDURE TRAY) ×3 IMPLANT
KIT ROOM TURNOVER OR (KITS) ×3 IMPLANT
NEEDLE SPNL 20GX3.5 QUINCKE YW (NEEDLE) ×3 IMPLANT
NEEDLE SPNL 25GX3.5 QUINCKE BL (NEEDLE) IMPLANT
NS IRRIG 1000ML POUR BTL (IV SOLUTION) ×3 IMPLANT
PAD ARMBOARD 7.5X6 YLW CONV (MISCELLANEOUS) ×6 IMPLANT
PATTIES SURGICAL .5 X3 (DISPOSABLE) ×3 IMPLANT
PENCIL BUTTON HOLSTER BLD 10FT (ELECTRODE) ×3 IMPLANT
PENCIL FOOT CONTROL (ELECTRODE) ×3 IMPLANT
SHEATH ENDOSCRUB 45 DEG (SHEATH) ×3 IMPLANT
SOLUTION ANTI FOG 6CC (MISCELLANEOUS) ×3 IMPLANT
STRIP CLOSURE SKIN 1/2X4 (GAUZE/BANDAGES/DRESSINGS) ×3 IMPLANT
SUT PLAIN 4 0 ~~LOC~~ 1 (SUTURE) ×3 IMPLANT
SUT SILK 2 0 FS (SUTURE) ×3 IMPLANT
SYRINGE 10CC LL (SYRINGE) ×6 IMPLANT
TOWEL OR 17X24 6PK STRL BLUE (TOWEL DISPOSABLE) ×3 IMPLANT
TRACKER ENT INSTRUMENT (MISCELLANEOUS) ×3 IMPLANT
TRACKER ENT PATIENT (MISCELLANEOUS) ×3 IMPLANT
TRAY ENT MC OR (CUSTOM PROCEDURE TRAY) ×3 IMPLANT
TUBE CONNECTING 12X1/4 (SUCTIONS) ×3 IMPLANT
WATER STERILE IRR 1000ML POUR (IV SOLUTION) ×3 IMPLANT
WIPE INSTRUMENT VISIWIPE 73X73 (MISCELLANEOUS) ×3 IMPLANT
YANKAUER SUCT BULB TIP NO VENT (SUCTIONS) ×3 IMPLANT

## 2016-02-28 NOTE — Anesthesia Preprocedure Evaluation (Addendum)
Anesthesia Evaluation  Patient identified by MRN, date of birth, ID band Patient awake    Reviewed: Allergy & Precautions, NPO status , Patient's Chart, lab work & pertinent test results  Airway Mallampati: II  TM Distance: >3 FB Neck ROM: Full    Dental  (+) Teeth Intact, Dental Advisory Given   Pulmonary sleep apnea , former smoker,    breath sounds clear to auscultation       Cardiovascular hypertension, Pt. on medications  Rhythm:Regular Rate:Normal     Neuro/Psych  Headaches, CVA    GI/Hepatic   Endo/Other  diabetes, Well Controlled, Type 2  Renal/GU      Musculoskeletal   Abdominal   Peds  Hematology   Anesthesia Other Findings   Reproductive/Obstetrics                            Anesthesia Physical Anesthesia Plan  ASA: III  Anesthesia Plan: General   Post-op Pain Management:    Induction: Intravenous  Airway Management Planned: Oral ETT  Additional Equipment:   Intra-op Plan:   Post-operative Plan: Extubation in OR  Informed Consent: I have reviewed the patients History and Physical, chart, labs and discussed the procedure including the risks, benefits and alternatives for the proposed anesthesia with the patient or authorized representative who has indicated his/her understanding and acceptance.     Plan Discussed with: CRNA and Anesthesiologist  Anesthesia Plan Comments:        Anesthesia Quick Evaluation

## 2016-02-28 NOTE — Anesthesia Postprocedure Evaluation (Signed)
Anesthesia Post Note  Patient: [name] [name]  Procedure(s) Performed: Procedure(s): ENDOSCOPIC SINUS SURGERY WITH NAVIGATION  Patient location during evaluation: PACU Anesthesia Type: General Level of consciousness: awake, awake and alert and oriented Pain management: pain level controlled Vital Signs Assessment: post-procedure vital signs reviewed and stable Respiratory status: spontaneous breathing, nonlabored ventilation and respiratory function stable Cardiovascular status: blood pressure returned to baseline Postop Assessment: no headache Anesthetic complications: no    Last Vitals:  Filed Vitals:   02/28/16 0936 02/28/16 0945  BP:  148/74  Pulse: 92 91  Temp:    Resp: 16 15    Last Pain:  Filed Vitals:   02/28/16 0947  PainSc: Asleep                 [name]

## 2016-02-28 NOTE — Op Note (Signed)
9:18 AM  02/28/2016  Surgeon: Ruby Cola, MD  Procedures Performed: [phone] image guidance 31255-Left left total ethmoidectomy 31267-Left left maxillary antrostomy with tissue removal 31276-Left left Draf 2A frontal sinusotomy 31288-Left Left sphenoidotomy with tissue removal  Operative Findings: soft, left sphenoid mass biopsied that came back as suspicious for malignancy, possibly Neuroendocrine or lymphoma on frozen section. The mass had eroded posteriorly through the posterior wall of the inferior sphenoid/superior clivus and the residual mass was eroding into the left cavernous sinus and was immediately adjacent to the left carotid, and pathology was not able to give a definitive diagnosis on frozen section, so no aggressive attempt to remove the entire mass was made given the risk of carotid injury.  Specimens: left sinus contents, left sphenoid mass for frozen and permanent.  PREOPERATIVE DIAGNOSIS: Left sphenoid mass.   POSTOPERATIVE DIAGNOSIS: Left sphenoid mass.    ANESTHESIA: General endotracheal.  ESTIMATED BLOOD LOSS: less than 100 mL.  HISTORY OF PRESENT ILLNESS: The patient is a  65yo female with a left sphenoid mass with apparent invasion into the left cavernous sinus. the patient was taken to the OR today for definitive endoscopic biopsy of the left sphenoid mass.  PROCEDURE: The patient was brought to the operating room and placed in the supine position. After adequate endotracheal anesthesia was obtained, the skin was draped in sterile fashion. Lidocaine 1% with 1:100,000 epinephrine was injected into the left greater palatine foramen transorally and the letf sphenopalatine foramen transnasally. Approximately 20 cc total was used.   Attention then was directed toward the left sinuses. Lidocaine 1% with 1:100,000 epinephrine was injected in the region of the anterior portion of the left middle turbinate and uncinate process. The uncinate process was identified  using the 0 degree endoscope and the left uncinate process was  removed systematically superiorly to inferiorly with back-biting forceps. Next, the left maxillary sinus was identified and the medial wall of the left maxillary sinus was widely opened using the microdebrider. I then switched to the 45 degree scope and inspected the maxillary sinus. The left maxillary natural ostium was widely opened using the backbiter and the 60 degree debrider until the left maxillary sinus was widely patent.  The left anterior and posterior ethmoid air cells were entered after identifying the left ethmoid bulla using the image guidance suction and dissected up to the skull base and out to the lamina papyrecea using the 55 degree curette and the 43m Kerrison punch. All of the ethmoid cells were meticulously dissected out using the Kerrison and debrider. The skull base and lamina papyracea were preserved throughout.   The left sphenoid natural ostium was then identified using the image guidance suction and the anterior wall of the left sphenoid was widely opened using the 359mKerrison all the way to the skull base and out to the  lamina papyracea. A soft, reddish/white mass was noted filling the entire left sphenoid sinus. I took generous biopsies of all of the mass visible in the left sphenoid, and I sent half for frozen section and placed the other half in saline for permanent pathology. On frozen section the left sphenoid mass came back suspicious for malignancy, possibly a Neuroendocrine carcinoma vs. Lymphoma. The mass had eroded through the posterior wall of the sphenoid/clival recess and the remaining mass was immediately adjacent to and surrounding the left cavernous carotid/left cavernous sinus on image guidance and on direct inspection. Given this finding and the high risk of cavernous sinus or carotid injury, and the lack  of a definitive diagnosis, an aggressive attempt to resect the entire mass was not made. I  cauterized some bleeding over the posterior sphenoid/clival defect, placed a 1x2cm piece of Alloderm over the defect, and bolstered this Alloderm with surgiflo hemostatic foam. No CSF leak was noted.  Next I switched back to the 45 degree endoscope, 60 degree microdebrider, and curved image guidance suction. The skull base was identified and dissected anteriorly using the 90 degree Kuhn-Bolger curette. The Agger Nasi cell was taken down and then the left frontal sinus was widely opened using the curved debrider, Hosemann 67m frontal mushroom punch, and the 90 degree curette.   A thorough irrigation was then carried out in the nasal cavity, and a left 8cm fingercot spacer was placed in the left  middle meatus/ethmoid cavity and secured to the patient's left cheek with silk ties/tape. The patient's stomach was suctioned out using a flexible OGtube. The patient was awakened from anesthesia and extubated without difficulty. The patient tolerated the procedure well and returned to the recovery room in stable condition.   Dr. MRuby Colawas present and performed the entire procedure. 9:18 AM GRuby Cola6/05/2016

## 2016-02-28 NOTE — Anesthesia Procedure Notes (Signed)
Procedure Name: Intubation Date/Time: 02/28/2016 7:48 AM Performed by: Ollen Bowl Pre-anesthesia Checklist: Patient identified, Emergency Drugs available, Suction available, Patient being monitored and Timeout performed Patient Re-evaluated:Patient Re-evaluated prior to inductionOxygen Delivery Method: Circle system utilized and Simple face mask Preoxygenation: Pre-oxygenation with 100% oxygen Intubation Type: IV induction Ventilation: Mask ventilation without difficulty Laryngoscope Size: Miller and 2 Grade View: Grade I Tube type: Oral Tube size: 7.5 mm Number of attempts: 1 Airway Equipment and Method: Patient positioned with wedge pillow,  Stylet and LTA kit utilized Placement Confirmation: ETT inserted through vocal cords under direct vision,  positive ETCO2 and breath sounds checked- equal and bilateral Secured at: 21 cm Tube secured with: Tape Dental Injury: Teeth and Oropharynx as per pre-operative assessment

## 2016-02-28 NOTE — Transfer of Care (Signed)
Immediate Anesthesia Transfer of Care Note  Patient: [name] [name]  Procedure(s) Performed: Procedure(s): ENDOSCOPIC SINUS SURGERY WITH STEALTH  (Left)  Patient Location: PACU  Anesthesia Type:General  Level of Consciousness: awake, alert  and oriented  Airway & Oxygen Therapy: Patient Spontanous Breathing and Patient connected to face mask oxygen  Post-op Assessment: Report given to RN and Post -op Vital signs reviewed and stable  Post vital signs: Reviewed and stable  Last Vitals:  Filed Vitals:   02/28/16 0637 02/28/16 0922  BP: 122/64 146/69  Pulse: 87   Temp: 36.8 C 36.6 C  Resp: 18     Last Pain:  Filed Vitals:   02/28/16 0926  PainSc: 3       Patients Stated Pain Goal: 3 (33/91/79 2178)  Complications: No apparent anesthesia complications

## 2016-02-28 NOTE — Discharge Instructions (Signed)
No nose blowing x 2 weeks, up ad lib, regular diet. Rx on front of chart for hydrocodone. Rx sent to pharmacy for prednisone taper, antibiotic, and zofran. Take the antibiotic and zofran but do not take the prednisone taper. Follow up with Dr. Simeon Craft in one week.

## 2016-03-02 ENCOUNTER — Encounter (HOSPITAL_COMMUNITY): Payer: Self-pay | Admitting: Otolaryngology

## 2016-03-04 ENCOUNTER — Other Ambulatory Visit: Payer: Self-pay | Admitting: Family

## 2016-03-04 DIAGNOSIS — Z1231 Encounter for screening mammogram for malignant neoplasm of breast: Secondary | ICD-10-CM

## 2016-03-09 ENCOUNTER — Ambulatory Visit: Payer: Self-pay | Admitting: Neurology

## 2016-03-10 ENCOUNTER — Telehealth: Payer: Self-pay | Admitting: *Deleted

## 2016-03-10 NOTE — Telephone Encounter (Signed)
Yes that is fine to go down to 32m as far as I am concerned thanks

## 2016-03-10 NOTE — Telephone Encounter (Signed)
Called and spoke to [name] [name]. Relayed per Dr Jaynee Eagles that she is fine for her to go down to 68m aspirin prior to hip surgery. [name] [name] verbalized understanding.

## 2016-03-10 NOTE — Telephone Encounter (Addendum)
Dr Jaynee Eagles- please advise Called pt. Advised pt tramadol denied by insurance. Requires PA. She is going to contact PCP/surgery for request.  She stated she was given Hydrocodone post-surgery to help with pain.She is taking Tramadol 151m for her hip pain. She is scheduled for surgery on her hip in July. She is wondering if it is okay to go down to 865maspirin prior to her hip surgery like she did prior to her sinus surgery. Advised I will speak to Dr AhJaynee Eaglesnd call her back to advise. She verbalized understanding.

## 2016-03-16 ENCOUNTER — Other Ambulatory Visit: Payer: Self-pay | Admitting: Orthopedic Surgery

## 2016-03-16 DIAGNOSIS — M25551 Pain in right hip: Secondary | ICD-10-CM | POA: Diagnosis not present

## 2016-03-16 DIAGNOSIS — D352 Benign neoplasm of pituitary gland: Secondary | ICD-10-CM | POA: Diagnosis not present

## 2016-03-16 DIAGNOSIS — I10 Essential (primary) hypertension: Secondary | ICD-10-CM | POA: Diagnosis not present

## 2016-03-17 ENCOUNTER — Ambulatory Visit
Admission: RE | Admit: 2016-03-17 | Discharge: 2016-03-17 | Disposition: A | Discharge status: Home / Self Care | Payer: Federal, State, Local not specified - PPO | Source: Ambulatory Visit | Attending: Family | Admitting: Family

## 2016-03-17 ENCOUNTER — Other Ambulatory Visit: Payer: Self-pay | Admitting: Family Medicine

## 2016-03-17 DIAGNOSIS — Z1231 Encounter for screening mammogram for malignant neoplasm of breast: Secondary | ICD-10-CM

## 2016-03-19 ENCOUNTER — Encounter (HOSPITAL_COMMUNITY): Payer: Self-pay

## 2016-03-19 ENCOUNTER — Encounter (HOSPITAL_COMMUNITY)
Admission: RE | Admit: 2016-03-19 | Discharge: 2016-03-19 | Disposition: A | Discharge status: Home / Self Care | Payer: Federal, State, Local not specified - PPO | Source: Ambulatory Visit | Attending: Orthopedic Surgery | Admitting: Orthopedic Surgery

## 2016-03-19 DIAGNOSIS — T8484XA Pain due to internal orthopedic prosthetic devices, implants and grafts, initial encounter: Secondary | ICD-10-CM | POA: Insufficient documentation

## 2016-03-19 DIAGNOSIS — Z0183 Encounter for blood typing: Secondary | ICD-10-CM | POA: Diagnosis not present

## 2016-03-19 DIAGNOSIS — Z96641 Presence of right artificial hip joint: Secondary | ICD-10-CM | POA: Insufficient documentation

## 2016-03-19 DIAGNOSIS — Z01812 Encounter for preprocedural laboratory examination: Secondary | ICD-10-CM | POA: Diagnosis present

## 2016-03-19 DIAGNOSIS — Y838 Other surgical procedures as the cause of abnormal reaction of the patient, or of later complication, without mention of misadventure at the time of the procedure: Secondary | ICD-10-CM | POA: Diagnosis not present

## 2016-03-19 HISTORY — DX: Gastro-esophageal reflux disease without esophagitis: K21.9

## 2016-03-19 HISTORY — DX: Transient cerebral ischemic attack, unspecified: G45.9

## 2016-03-19 HISTORY — DX: Edema, unspecified: R60.9

## 2016-03-19 HISTORY — DX: Pain in unspecified joint: M25.50

## 2016-03-19 HISTORY — DX: Unspecified macular degeneration: H35.30

## 2016-03-19 HISTORY — DX: Polyneuropathy, unspecified: G62.9

## 2016-03-19 HISTORY — DX: Unspecified urinary incontinence: R32

## 2016-03-19 HISTORY — DX: Hyperlipidemia, unspecified: E78.5

## 2016-03-19 HISTORY — DX: Localized edema: R60.0

## 2016-03-19 LAB — URINALYSIS, ROUTINE W REFLEX MICROSCOPIC
GLUCOSE, UA: NEGATIVE mg/dL
HGB URINE DIPSTICK: NEGATIVE
KETONES UR: NEGATIVE mg/dL
Leukocytes, UA: NEGATIVE
NITRITE: NEGATIVE
PROTEIN: NEGATIVE mg/dL
Specific Gravity, Urine: 1.012 (ref 1.005–1.030)
pH: 6 (ref 5.0–8.0)

## 2016-03-19 LAB — BASIC METABOLIC PANEL
Anion gap: 7 (ref 5–15)
BUN: 15 mg/dL (ref 6–20)
CHLORIDE: 104 mmol/L (ref 101–111)
CO2: 26 mmol/L (ref 22–32)
CREATININE: 0.78 mg/dL (ref 0.44–1.00)
Calcium: 8.9 mg/dL (ref 8.9–10.3)
GFR calc Af Amer: >60 mL/min (ref >60)
GFR calc non Af Amer: >60 mL/min (ref >60)
GLUCOSE: 118 mg/dL — AB (ref 65–99)
POTASSIUM: 4.3 mmol/L (ref 3.5–5.1)
SODIUM: 137 mmol/L (ref 135–145)

## 2016-03-19 LAB — SURGICAL PCR SCREEN
MRSA, PCR: NEGATIVE
Staphylococcus aureus: NEGATIVE

## 2016-03-19 LAB — CBC WITH DIFFERENTIAL/PLATELET
Basophils Absolute: 0.1 10*3/uL (ref 0.0–0.1)
Basophils Relative: 1 %
EOS ABS: 0.5 10*3/uL (ref 0.0–0.7)
EOS PCT: 5 %
HCT: 40 % (ref 36.0–46.0)
Hemoglobin: 12.7 g/dL (ref 12.0–15.0)
LYMPHS ABS: 1.9 10*3/uL (ref 0.7–4.0)
LYMPHS PCT: 17 %
MCH: 26.4 pg (ref 26.0–34.0)
MCHC: 31.8 g/dL (ref 30.0–36.0)
MCV: 83.2 fL (ref 78.0–100.0)
MONO ABS: 0.6 10*3/uL (ref 0.1–1.0)
MONOS PCT: 5 %
Neutro Abs: 8.1 10*3/uL — ABNORMAL HIGH (ref 1.7–7.7)
Neutrophils Relative %: 72 %
PLATELETS: 305 10*3/uL (ref 150–400)
RBC: 4.81 MIL/uL (ref 3.87–5.11)
RDW: 14.8 % (ref 11.5–15.5)
WBC: 11.2 10*3/uL — AB (ref 4.0–10.5)

## 2016-03-19 LAB — TYPE AND SCREEN
ABO/RH(D): A POS
ANTIBODY SCREEN: NEGATIVE

## 2016-03-19 LAB — PROTIME-INR
INR: 0.96 (ref 0.00–1.49)
PROTHROMBIN TIME: 13 s (ref 11.6–15.2)

## 2016-03-19 LAB — GLUCOSE, CAPILLARY: Glucose-Capillary: 123 mg/dL — ABNORMAL HIGH (ref 65–99)

## 2016-03-19 LAB — APTT: aPTT: 27 seconds (ref 24–37)

## 2016-03-19 MED ORDER — CHLORHEXIDINE GLUCONATE 4 % EX LIQD: 60.0000 mL | Freq: Once | CUTANEOUS | Status: DC

## 2016-03-19 NOTE — Progress Notes (Addendum)
Neurologist is Dr.Ahern  Cardiologist denies but states she did see one in [phone] for stress test.Dr.Ganji.Reports under media tab  Echo report in epic from 01-31-16  Heart cath report in epic from 03/20/10 under media tab  Medical is Dr.Cammie Fulp  EKG in epic from 02-01-16  CXR in epic from 5-12/17

## 2016-03-19 NOTE — Pre-Procedure Instructions (Signed)
AMANDALEE LACAP  03/19/2016      [id number] [place] [facility] [address]  [place] [id number]: [phone]: [phone]   Your procedure is scheduled on Mon, July 10 @ 10:00 AM  [facility]  Call this number if you have problems the morning of surgery:  [phone]  Remember:  Do not eat food or drink liquids after midnight.  Take these medicines the morning of surgery with A SIP OF WATER Amlodipine(Norvasc),Pain Pill(if needed),and Pantoprazole(Protonix)             Stop taking your Aspirin along with any Vitamins or Herbal Medications a week prior to surgery. No Goody's,BC's,Aleve,Advil,Motrin,Ibuprofen,or Fish Oil.    Do not wear jewelry, make-up or nail polish.  Do not wear lotions, powders, or perfumes.    Do not shave 48 hours prior to surgery.    Do not bring valuables to the hospital.  [facility] or [facility]ables.  Contacts, dentures or bridgework may not be worn into surgery.  Leave your suitcase in the car.  After surgery it may be brought to your room.  For patients admitted to the hospital, discharge time will be determined by your treatment team.  Patients discharged the day of surgery will not be allowed to drive home.    [facility] - [facility]  Before surgery, you can play an important role.  Because skin is not sterile, your skin needs to be as free of germs as possible.  You can reduce the number of germs on you skin by washing with CHG (chlorahexidine gluconate) soap before surgery.  CHG is an antiseptic cleaner which kills germs and bonds with the skin to continue killing germs even after washing.  Please DO NOT use if you have an allergy to CHG or antibacterial soaps.  If your skin becomes reddened/irritated stop using the CHG and inform your nurse when you arrive at Short Stay.  Do not shave (including  legs and underarms) for at least 48 hours prior to the first CHG shower.  You may shave your face.  Please follow these instructions carefully:   1.  Shower with CHG Soap the night before surgery and the                                morning of Surgery.  2.  If you choose to wash your hair, wash your hair first as usual with your       normal shampoo.  3.  After you shampoo, rinse your hair and body thoroughly to remove the                      Shampoo.  4.  Use CHG as you would any other liquid soap.  You can apply chg directly       to the skin and wash gently with scrungie or a clean washcloth.  5.  Apply the CHG Soap to your body ONLY FROM THE NECK DOWN.        Do not use on open wounds or open sores.  Avoid contact with your eyes,       ears, mouth and genitals (private parts).  Wash genitals (private parts)       with your normal soap.  6.  Wash  thoroughly, paying special attention to the area where your surgery        will be performed.  7.  Thoroughly rinse your body with warm water from the neck down.  8.  DO NOT shower/wash with your normal soap after using and rinsing off       the CHG Soap.  9.  Pat yourself dry with a clean towel.            10.  Wear clean pajamas.            11.  Place clean sheets on your bed the night of your first shower and do not        sleep with pets.  Day of Surgery  Do not apply any lotions/deoderants the morning of surgery.  Please wear clean clothes to the hospital/surgery center.    Please read over the following fact sheets that you were given. MRSA Information

## 2016-03-21 DIAGNOSIS — Z9889 Other specified postprocedural states: Secondary | ICD-10-CM

## 2016-03-21 HISTORY — DX: Other specified postprocedural states: Z98.890

## 2016-03-23 ENCOUNTER — Encounter: Payer: Self-pay | Admitting: Neurology

## 2016-03-23 ENCOUNTER — Telehealth: Payer: Self-pay | Admitting: Neurology

## 2016-03-23 DIAGNOSIS — I10 Essential (primary) hypertension: Secondary | ICD-10-CM | POA: Diagnosis not present

## 2016-03-23 NOTE — Telephone Encounter (Signed)
[name] [name], can you pull up the holter monitor results for patient? She has emailed about them but I can't pull it up, thanls

## 2016-03-23 NOTE — Telephone Encounter (Signed)
Called Kelly back. Informed her per Dr Jaynee Eagles that we are not completing the PA. We advised pt on 6/20 to contact PCP for request for tramadol. She verbalized understanding.

## 2016-03-23 NOTE — Telephone Encounter (Signed)
[name] [name] with [facility] is calling for prior authorization for tramadol 100 mg 24 hr..  Please call.

## 2016-03-25 ENCOUNTER — Telehealth: Payer: Self-pay | Admitting: *Deleted

## 2016-03-25 NOTE — Telephone Encounter (Signed)
Dr Jaynee Eagles- received results via fax. Looks like it is the same thing that is scanned in EPIC. I put with your notes from today for review

## 2016-03-25 NOTE — Telephone Encounter (Signed)
Called and spoke to Kim at [facility] heart [facility] center on [address]. She is going to fax report from heart monitor to [phone], Floyde Parkins.

## 2016-03-26 NOTE — Telephone Encounter (Signed)
Cardiac monitoring did not show atrial fibrillation. thanks

## 2016-03-27 DIAGNOSIS — Z96649 Presence of unspecified artificial hip joint: Secondary | ICD-10-CM

## 2016-03-27 DIAGNOSIS — T84038A Mechanical loosening of other internal prosthetic joint, initial encounter: Secondary | ICD-10-CM

## 2016-03-27 MED ORDER — VANCOMYCIN HCL 10 G IV SOLR
1500.0000 mg | INTRAVENOUS | Status: AC
  Administered 2016-03-30 (×2): 1500 mg via INTRAVENOUS
  Filled 2016-03-27: qty 1500

## 2016-03-27 MED ORDER — DEXTROSE-NACL 5-0.45 % IV SOLN: INTRAVENOUS | Status: DC

## 2016-03-27 NOTE — Telephone Encounter (Addendum)
Called and spoke to pt about cardiac monitor results per Dr  Jaynee Eagles note. Pt verbalized understanding. Pt wondering if Dr Jaynee Eagles okay for her to proceed with cardiac implant. Advised I will send message to Dr Jaynee Eagles and we will call back and let her know.  She verbalized understanding.

## 2016-03-28 NOTE — H&P (Signed)
TOTAL HIP REVISION ADMISSION H&P  Patient is admitted for right revision total hip arthroplasty.  Subjective:  Chief Complaint: right hip pain  HPI: [name] [name], 65 y.o. female, has a history of pain and functional disability in the right hip due to arthritis and patient has failed non-surgical conservative treatments for greater than 12 weeks to include NSAID's and/or analgesics, weight reduction as appropriate and activity modification. The indications for the revision total hip arthroplasty are recalled ASR with persistent pain and popping.  Onset of symptoms was gradual starting 2 years ago with gradually worsening course since that time.  Prior procedures on the right hip include arthroplasty.  Patient currently rates pain in the right hip at 10 out of 10 with activity.  There is worsening of pain with activity and weight bearing, pain that interfers with activities of daily living and pain with passive range of motion. 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  This condition presents safety issues increasing the risk of falls.     There is no current active infection.  Patient Active Problem List   Diagnosis Date Noted  . 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 Thalamic infarct, acute ([place]) 02/01/2016  . 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 Stroke ([place]) 01/31/2016  . Stroke-like symptoms 01/31/2016  . Hyperglycemia 01/31/2016  . Diabetes mellitus without complication ([place])   . Hypertension   . Obesity    Past Medical History  Diagnosis Date  . Arthritis   . Sleep apnea     no CPAP since weight loss after bariatric surgery '12  . Hypertension     takes Valsartan-HCTZ daily  . Hyperlipidemia     takes Crestor daily  . TIA (transient ischemic attack)     x 2 in May 2017. Numbness in bottom lip  . History of bronchitis     last time > 5 yrs ago  . Headache     occasionally but not needing to take meds  . Peripheral neuropathy (HCC)     not  on any meds  . GERD (gastroesophageal reflux disease)     takes Protonix daily  . Joint pain   . Peripheral edema   . Chronic back pain     spondylosis  . Urinary frequency   . Urinary urgency   . Urinary incontinence   . Nocturia   . Diabetes mellitus without complication (HCC)     diet controlled  . Macular degeneration     "just watching"    Past Surgical History  Procedure Laterality Date  . Bariatric surgery  11/24/10    RNY  . Tonsillectomy    . Abdominal hysterectomy    . Total hip arthroplasty    . Joint replacement      bilateral knees  . Sinus endo w/fusion  02/28/2016    Procedure: ENDOSCOPIC SINUS SURGERY WITH NAVIGATION;  Surgeon: Ruby Cola, MD;  Location: [facility];  Service: ENT;;  . Colonoscopy    . Esophagogastroduodenoscopy      in the 70's  . Cataract surgery Bilateral     No prescriptions prior to admission   Allergies  Allergen Reactions  . Atorvastatin Other (See Comments)    Muscle weakness Muscle Aches  . Ambien [Zolpidem] Other (See Comments)    NIGHT TERRORS  . Penicillins Other (See Comments)    Made nose run uncontrollably  . Simvastatin Other (See Comments)    Muscle weakness  . [place]etta 10  Mcg Pen [Exenatide] Nausea Only  . Codeine Nausea And Vomiting    Social History  Substance Use Topics  . Smoking status: Former Research scientist (life sciences)  . Smokeless tobacco: Not on file     Comment: quit smoking 9 yrs ago  . Alcohol Use: 0.0 oz/week    0 Standard drinks or equivalent per week     Comment: Rare    Family History  Problem Relation Age of Onset  . Prostate cancer Father   . Lymphoma Father   . Congestive Heart Failure Maternal Grandmother   . Lung cancer Maternal Grandfather       Review of Systems  Constitutional: Negative.   HENT: Negative.   Eyes: Negative.   Respiratory: Negative.   Cardiovascular: Negative.   Gastrointestinal: Negative.   Genitourinary: Negative.   Musculoskeletal: Positive for joint pain.  Skin: Negative.    Neurological: Negative.   Endo/Heme/Allergies: Bruises/bleeds easily.  Psychiatric/Behavioral: Negative.     Objective:  Physical Exam  Constitutional: She is oriented to person, place, and time. She appears well-developed and well-nourished.  HENT:  Head: Normocephalic and atraumatic.  Eyes: Pupils are equal, round, and reactive to light.  Neck: Normal range of motion. Neck supple.  Cardiovascular: Intact distal pulses.   Respiratory: Effort normal.  Musculoskeletal: She exhibits tenderness.  Her total scar is well-healed. Internal and external rotation are 40 each. Foot tap is negative. Good power to testing of the abductors extensors, abductors and flexors of the hip.    Neurological: She is alert and oriented to person, place, and time.  Skin: Skin is warm and dry.  Psychiatric: She has a normal mood and affect. Her behavior is normal. Judgment and thought content normal.    Vital signs in last 24 hours:     Labs:   Estimated body mass index is 45.01 kg/(m^2) as calculated from the following:   Height as of 02/07/16: _0  (1.6 m).   Weight as of 02/07/16: 115.214 kg (254 lb).  Imaging Review:  Plain radiographs demonstrate no change in the position of the components.  However, I have had a few of the ASR shells that were actually loose with negative MRI scans.  Assessment/Plan:  End stage arthritis, right hip(s) with failed previous arthroplasty.  The patient history, physical examination, clinical judgement of the provider and imaging studies are consistent with end stage degenerative joint disease of the right hip(s), previous total hip arthroplasty. Revision total hip arthroplasty is deemed medically necessary. The treatment options including medical management, injection therapy, arthroscopy and arthroplasty were discussed at length. The risks and benefits of total hip arthroplasty were presented and reviewed. The risks due to aseptic loosening, infection,  stiffness, dislocation/subluxation,  thromboembolic complications and other imponderables were discussed.  The patient acknowledged the explanation, agreed to proceed with the plan and consent was signed. Patient is being admitted for inpatient treatment for surgery, pain control, PT, OT, prophylactic antibiotics, VTE prophylaxis, progressive ambulation and ADL's and discharge planning. The patient is planning to be discharged home with home health services

## 2016-03-30 ENCOUNTER — Inpatient Hospital Stay (HOSPITAL_COMMUNITY): Payer: Medicare Other | Admitting: Anesthesiology

## 2016-03-30 ENCOUNTER — Encounter (HOSPITAL_COMMUNITY): Payer: Self-pay | Admitting: *Deleted

## 2016-03-30 ENCOUNTER — Inpatient Hospital Stay (HOSPITAL_COMMUNITY)
Admission: RE | Admit: 2016-03-30 | Discharge: 2016-04-01 | DRG: 467 | Disposition: A | Discharge status: Home Health | Payer: Medicare Other | Source: Ambulatory Visit | Attending: Orthopedic Surgery | Admitting: Orthopedic Surgery

## 2016-03-30 ENCOUNTER — Inpatient Hospital Stay (HOSPITAL_COMMUNITY): Payer: Medicare Other

## 2016-03-30 ENCOUNTER — Encounter (HOSPITAL_COMMUNITY)
Admission: RE | Disposition: A | Discharge status: Home Health | Payer: Self-pay | Source: Ambulatory Visit | Attending: Orthopedic Surgery

## 2016-03-30 DIAGNOSIS — H3530 Unspecified macular degeneration: Secondary | ICD-10-CM | POA: Diagnosis present

## 2016-03-30 DIAGNOSIS — E785 Hyperlipidemia, unspecified: Secondary | ICD-10-CM | POA: Diagnosis not present

## 2016-03-30 DIAGNOSIS — T84018A Broken internal joint prosthesis, other site, initial encounter: Secondary | ICD-10-CM

## 2016-03-30 DIAGNOSIS — T84090A Other mechanical complication of internal right hip prosthesis, initial encounter: Secondary | ICD-10-CM | POA: Diagnosis not present

## 2016-03-30 DIAGNOSIS — Z96653 Presence of artificial knee joint, bilateral: Secondary | ICD-10-CM | POA: Diagnosis present

## 2016-03-30 DIAGNOSIS — Z8673 Personal history of transient ischemic attack (TIA), and cerebral infarction without residual deficits: Secondary | ICD-10-CM

## 2016-03-30 DIAGNOSIS — G629 Polyneuropathy, unspecified: Secondary | ICD-10-CM | POA: Diagnosis not present

## 2016-03-30 DIAGNOSIS — T84030A Mechanical loosening of internal right hip prosthetic joint, initial encounter: Secondary | ICD-10-CM | POA: Diagnosis not present

## 2016-03-30 DIAGNOSIS — Z801 Family history of malignant neoplasm of trachea, bronchus and lung: Secondary | ICD-10-CM | POA: Diagnosis not present

## 2016-03-30 DIAGNOSIS — K219 Gastro-esophageal reflux disease without esophagitis: Secondary | ICD-10-CM | POA: Diagnosis not present

## 2016-03-30 DIAGNOSIS — Z880 Allergy status to penicillin: Secondary | ICD-10-CM | POA: Diagnosis not present

## 2016-03-30 DIAGNOSIS — Z807 Family history of other malignant neoplasms of lymphoid, hematopoietic and related tissues: Secondary | ICD-10-CM

## 2016-03-30 DIAGNOSIS — E119 Type 2 diabetes mellitus without complications: Secondary | ICD-10-CM | POA: Diagnosis present

## 2016-03-30 DIAGNOSIS — M1990 Unspecified osteoarthritis, unspecified site: Secondary | ICD-10-CM | POA: Diagnosis not present

## 2016-03-30 DIAGNOSIS — Z471 Aftercare following joint replacement surgery: Secondary | ICD-10-CM | POA: Diagnosis not present

## 2016-03-30 DIAGNOSIS — T84038A Mechanical loosening of other internal prosthetic joint, initial encounter: Secondary | ICD-10-CM

## 2016-03-30 DIAGNOSIS — Z87891 Personal history of nicotine dependence: Secondary | ICD-10-CM | POA: Diagnosis not present

## 2016-03-30 DIAGNOSIS — Z96641 Presence of right artificial hip joint: Secondary | ICD-10-CM | POA: Diagnosis not present

## 2016-03-30 DIAGNOSIS — M479 Spondylosis, unspecified: Secondary | ICD-10-CM | POA: Diagnosis present

## 2016-03-30 DIAGNOSIS — I10 Essential (primary) hypertension: Secondary | ICD-10-CM | POA: Diagnosis present

## 2016-03-30 DIAGNOSIS — Z8249 Family history of ischemic heart disease and other diseases of the circulatory system: Secondary | ICD-10-CM

## 2016-03-30 DIAGNOSIS — Z9884 Bariatric surgery status: Secondary | ICD-10-CM | POA: Diagnosis not present

## 2016-03-30 DIAGNOSIS — Z6841 Body Mass Index (BMI) 40.0 and over, adult: Secondary | ICD-10-CM

## 2016-03-30 DIAGNOSIS — D62 Acute posthemorrhagic anemia: Secondary | ICD-10-CM | POA: Diagnosis not present

## 2016-03-30 DIAGNOSIS — G4730 Sleep apnea, unspecified: Secondary | ICD-10-CM | POA: Diagnosis present

## 2016-03-30 DIAGNOSIS — Z885 Allergy status to narcotic agent status: Secondary | ICD-10-CM | POA: Diagnosis not present

## 2016-03-30 DIAGNOSIS — M549 Dorsalgia, unspecified: Secondary | ICD-10-CM | POA: Diagnosis not present

## 2016-03-30 DIAGNOSIS — Z96649 Presence of unspecified artificial hip joint: Secondary | ICD-10-CM

## 2016-03-30 DIAGNOSIS — G8929 Other chronic pain: Secondary | ICD-10-CM | POA: Diagnosis present

## 2016-03-30 DIAGNOSIS — T8484XA Pain due to internal orthopedic prosthetic devices, implants and grafts, initial encounter: Secondary | ICD-10-CM | POA: Diagnosis not present

## 2016-03-30 DIAGNOSIS — Z888 Allergy status to other drugs, medicaments and biological substances status: Secondary | ICD-10-CM | POA: Diagnosis not present

## 2016-03-30 HISTORY — PX: TOTAL HIP REVISION: SHX763

## 2016-03-30 LAB — GLUCOSE, CAPILLARY
GLUCOSE-CAPILLARY: 180 mg/dL — AB (ref 65–99)
GLUCOSE-CAPILLARY: 120 mg/dL — AB (ref 65–99)
GLUCOSE-CAPILLARY: 114 mg/dL — AB (ref 65–99)

## 2016-03-30 SURGERY — TOTAL HIP REVISION
Anesthesia: General | Site: Hip | Laterality: Right

## 2016-03-30 MED ORDER — DEXTROSE 5 % IV SOLN
500.0000 mg | Freq: Four times a day (QID) | INTRAVENOUS | Status: DC | PRN
  Administered 2016-03-30: 500 mg via INTRAVENOUS
  Filled 2016-03-30 (×2): qty 5

## 2016-03-30 MED ORDER — BISACODYL 5 MG PO TBEC: 5.0000 mg | DELAYED_RELEASE_TABLET | Freq: Every day | ORAL | Status: DC | PRN

## 2016-03-30 MED ORDER — SUGAMMADEX SODIUM 200 MG/2ML IV SOLN
INTRAVENOUS | Status: DC | PRN
  Administered 2016-03-30: 200 mg via INTRAVENOUS

## 2016-03-30 MED ORDER — METOCLOPRAMIDE HCL 5 MG/ML IJ SOLN
5.0000 mg | Freq: Three times a day (TID) | INTRAMUSCULAR | Status: DC | PRN
Start: 2016-03-30 — End: 2016-04-01

## 2016-03-30 MED ORDER — BUPIVACAINE LIPOSOME 1.3 % IJ SUSP
20.0000 mL | INTRAMUSCULAR | Status: DC
  Filled 2016-03-30: qty 20

## 2016-03-30 MED ORDER — DOCUSATE SODIUM 100 MG PO CAPS
100.0000 mg | ORAL_CAPSULE | Freq: Two times a day (BID) | ORAL | Status: DC
  Administered 2016-03-30 – 2016-04-01 (×4): 100 mg via ORAL
  Filled 2016-03-30 (×4): qty 1

## 2016-03-30 MED ORDER — DEXAMETHASONE SODIUM PHOSPHATE 10 MG/ML IJ SOLN
10.0000 mg | Freq: Once | INTRAMUSCULAR | Status: AC
  Administered 2016-03-31: 10 mg via INTRAVENOUS
  Filled 2016-03-30: qty 1

## 2016-03-30 MED ORDER — BUPIVACAINE LIPOSOME 1.3 % IJ SUSP
INTRAMUSCULAR | Status: DC | PRN
  Administered 2016-03-30: 20 mL

## 2016-03-30 MED ORDER — OXYCODONE-ACETAMINOPHEN 5-325 MG PO TABS: 1.0000 | ORAL_TABLET | ORAL | Status: DC | PRN

## 2016-03-30 MED ORDER — HYDROMORPHONE HCL 1 MG/ML IJ SOLN: 1.0000 mg | INTRAMUSCULAR | Status: DC | PRN

## 2016-03-30 MED ORDER — TRANEXAMIC ACID 1000 MG/10ML IV SOLN
2000.0000 mg | Freq: Once | INTRAVENOUS | Status: AC
  Administered 2016-03-30: 2000 mg via TOPICAL
  Filled 2016-03-30: qty 20

## 2016-03-30 MED ORDER — FLEET ENEMA 7-19 GM/118ML RE ENEM: 1.0000 | ENEMA | Freq: Once | RECTAL | Status: DC | PRN

## 2016-03-30 MED ORDER — ONDANSETRON HCL 4 MG/2ML IJ SOLN: 4.0000 mg | Freq: Four times a day (QID) | INTRAMUSCULAR | Status: DC | PRN

## 2016-03-30 MED ORDER — ASPIRIN EC 325 MG PO TBEC: 325.0000 mg | DELAYED_RELEASE_TABLET | Freq: Two times a day (BID) | ORAL | Status: DC

## 2016-03-30 MED ORDER — VALSARTAN-HYDROCHLOROTHIAZIDE 160-25 MG PO TABS: 1.0000 | ORAL_TABLET | Freq: Every day | ORAL | Status: DC

## 2016-03-30 MED ORDER — PROPOFOL 10 MG/ML IV BOLUS
INTRAVENOUS | Status: DC | PRN
  Administered 2016-03-30: 160 mg via INTRAVENOUS

## 2016-03-30 MED ORDER — PHENOL 1.4 % MT LIQD: 1.0000 | OROMUCOSAL | Status: DC | PRN

## 2016-03-30 MED ORDER — KCL IN DEXTROSE-NACL 20-5-0.45 MEQ/L-%-% IV SOLN
INTRAVENOUS | Status: DC
  Administered 2016-03-30 – 2016-03-31 (×3): via INTRAVENOUS
  Filled 2016-03-30 (×2): qty 1000

## 2016-03-30 MED ORDER — DEXAMETHASONE SODIUM PHOSPHATE 10 MG/ML IJ SOLN
INTRAMUSCULAR | Status: AC
  Filled 2016-03-30: qty 1

## 2016-03-30 MED ORDER — HYDROMORPHONE HCL 1 MG/ML IJ SOLN
INTRAMUSCULAR | Status: AC
  Administered 2016-03-30: 0.5 mg via INTRAVENOUS
  Filled 2016-03-30: qty 1

## 2016-03-30 MED ORDER — FENTANYL CITRATE (PF) 250 MCG/5ML IJ SOLN
INTRAMUSCULAR | Status: DC | PRN
  Administered 2016-03-30 (×3): 50 ug via INTRAVENOUS
  Administered 2016-03-30: 100 ug via INTRAVENOUS
  Administered 2016-03-30: 50 ug via INTRAVENOUS

## 2016-03-30 MED ORDER — PHENYLEPHRINE HCL 10 MG/ML IJ SOLN
INTRAMUSCULAR | Status: DC | PRN
  Administered 2016-03-30: 80 ug via INTRAVENOUS
  Administered 2016-03-30: 160 ug via INTRAVENOUS
  Administered 2016-03-30 (×6): 80 ug via INTRAVENOUS

## 2016-03-30 MED ORDER — SODIUM CHLORIDE 0.9 % IR SOLN
Status: DC | PRN
  Administered 2016-03-30: 1000 mL

## 2016-03-30 MED ORDER — BUPIVACAINE-EPINEPHRINE (PF) 0.25% -1:200000 IJ SOLN
INTRAMUSCULAR | Status: DC | PRN
  Administered 2016-03-30 (×2): 30 mL

## 2016-03-30 MED ORDER — METOCLOPRAMIDE HCL 5 MG PO TABS: 5.0000 mg | ORAL_TABLET | Freq: Three times a day (TID) | ORAL | Status: DC | PRN

## 2016-03-30 MED ORDER — MIDAZOLAM HCL 2 MG/2ML IJ SOLN
INTRAMUSCULAR | Status: AC
  Filled 2016-03-30: qty 2

## 2016-03-30 MED ORDER — ALUM & MAG HYDROXIDE-SIMETH 200-200-20 MG/5ML PO SUSP: 30.0000 mL | ORAL | Status: DC | PRN

## 2016-03-30 MED ORDER — METHOCARBAMOL 500 MG PO TABS
500.0000 mg | ORAL_TABLET | Freq: Four times a day (QID) | ORAL | Status: DC | PRN
  Administered 2016-03-30 – 2016-03-31 (×4): 500 mg via ORAL
  Filled 2016-03-30 (×5): qty 1

## 2016-03-30 MED ORDER — ONDANSETRON HCL 4 MG/2ML IJ SOLN
INTRAMUSCULAR | Status: AC
  Filled 2016-03-30: qty 2

## 2016-03-30 MED ORDER — TRANEXAMIC ACID 1000 MG/10ML IV SOLN
2000.0000 mg | INTRAVENOUS | Status: DC | PRN
  Administered 2016-03-30: 2000 mg via TOPICAL

## 2016-03-30 MED ORDER — KCL IN DEXTROSE-NACL 20-5-0.45 MEQ/L-%-% IV SOLN
INTRAVENOUS | Status: AC
  Filled 2016-03-30: qty 1000

## 2016-03-30 MED ORDER — IRBESARTAN 150 MG PO TABS
150.0000 mg | ORAL_TABLET | Freq: Every day | ORAL | Status: DC
  Administered 2016-03-31 – 2016-04-01 (×2): 150 mg via ORAL
  Filled 2016-03-30 (×2): qty 1

## 2016-03-30 MED ORDER — SENNOSIDES-DOCUSATE SODIUM 8.6-50 MG PO TABS: 1.0000 | ORAL_TABLET | Freq: Every evening | ORAL | Status: DC | PRN

## 2016-03-30 MED ORDER — BUPIVACAINE-EPINEPHRINE (PF) 0.25% -1:200000 IJ SOLN
INTRAMUSCULAR | Status: AC
  Filled 2016-03-30: qty 30

## 2016-03-30 MED ORDER — OXYCODONE HCL 5 MG PO TABS
5.0000 mg | ORAL_TABLET | ORAL | Status: DC | PRN
  Administered 2016-03-30 – 2016-04-01 (×9): 10 mg via ORAL
  Filled 2016-03-30 (×8): qty 2

## 2016-03-30 MED ORDER — FENTANYL CITRATE (PF) 250 MCG/5ML IJ SOLN
INTRAMUSCULAR | Status: AC
  Filled 2016-03-30: qty 5

## 2016-03-30 MED ORDER — HYDROMORPHONE HCL 1 MG/ML IJ SOLN
INTRAMUSCULAR | Status: AC
  Filled 2016-03-30: qty 1

## 2016-03-30 MED ORDER — LIDOCAINE 2% (20 MG/ML) 5 ML SYRINGE
INTRAMUSCULAR | Status: DC | PRN
  Administered 2016-03-30: 100 mg via INTRAVENOUS

## 2016-03-30 MED ORDER — ONDANSETRON HCL 4 MG PO TABS: 4.0000 mg | ORAL_TABLET | Freq: Four times a day (QID) | ORAL | Status: DC | PRN

## 2016-03-30 MED ORDER — PHENYLEPHRINE HCL 10 MG/ML IJ SOLN
10.0000 mg | INTRAVENOUS | Status: DC | PRN
  Administered 2016-03-30: 20 ug/min via INTRAVENOUS

## 2016-03-30 MED ORDER — TIZANIDINE HCL 2 MG PO TABS: 2.0000 mg | ORAL_TABLET | Freq: Four times a day (QID) | ORAL | Status: DC | PRN

## 2016-03-30 MED ORDER — BUPIVACAINE-EPINEPHRINE (PF) 0.25% -1:200000 IJ SOLN
INTRAMUSCULAR | Status: AC
Start: 2016-03-30 — End: 2016-03-30
  Filled 2016-03-30: qty 30

## 2016-03-30 MED ORDER — HYDROMORPHONE HCL 1 MG/ML IJ SOLN
0.2500 mg | INTRAMUSCULAR | Status: DC | PRN
  Administered 2016-03-30 (×2): 0.5 mg via INTRAVENOUS

## 2016-03-30 MED ORDER — LACTATED RINGERS IV SOLN: INTRAVENOUS | Status: DC

## 2016-03-30 MED ORDER — DIPHENHYDRAMINE HCL 12.5 MG/5ML PO ELIX: 12.5000 mg | ORAL_SOLUTION | ORAL | Status: DC | PRN

## 2016-03-30 MED ORDER — PANTOPRAZOLE SODIUM 40 MG PO TBEC
40.0000 mg | DELAYED_RELEASE_TABLET | Freq: Every day | ORAL | Status: DC
  Administered 2016-03-31 – 2016-04-01 (×2): 40 mg via ORAL
  Filled 2016-03-30 (×2): qty 1

## 2016-03-30 MED ORDER — ACETAMINOPHEN 10 MG/ML IV SOLN
INTRAVENOUS | Status: DC | PRN
  Administered 2016-03-30: 1000 mg via INTRAVENOUS

## 2016-03-30 MED ORDER — MENTHOL 3 MG MT LOZG: 1.0000 | LOZENGE | OROMUCOSAL | Status: DC | PRN

## 2016-03-30 MED ORDER — PHENYLEPHRINE 40 MCG/ML (10ML) SYRINGE FOR IV PUSH (FOR BLOOD PRESSURE SUPPORT)
PREFILLED_SYRINGE | INTRAVENOUS | Status: AC
  Filled 2016-03-30: qty 10

## 2016-03-30 MED ORDER — SUGAMMADEX SODIUM 200 MG/2ML IV SOLN
INTRAVENOUS | Status: AC
  Filled 2016-03-30: qty 2

## 2016-03-30 MED ORDER — ASPIRIN EC 325 MG PO TBEC
325.0000 mg | DELAYED_RELEASE_TABLET | Freq: Every day | ORAL | Status: DC
  Administered 2016-03-31 – 2016-04-01 (×2): 325 mg via ORAL
  Filled 2016-03-30 (×2): qty 1

## 2016-03-30 MED ORDER — MIDAZOLAM HCL 5 MG/5ML IJ SOLN
INTRAMUSCULAR | Status: DC | PRN
  Administered 2016-03-30 (×2): 1 mg via INTRAVENOUS

## 2016-03-30 MED ORDER — LACTATED RINGERS IV SOLN
INTRAVENOUS | Status: DC
  Administered 2016-03-30 (×2): via INTRAVENOUS

## 2016-03-30 MED ORDER — HYDROCHLOROTHIAZIDE 25 MG PO TABS
25.0000 mg | ORAL_TABLET | Freq: Every day | ORAL | Status: DC
  Administered 2016-03-31 – 2016-04-01 (×2): 25 mg via ORAL
  Filled 2016-03-30 (×2): qty 1

## 2016-03-30 MED ORDER — ACETAMINOPHEN 650 MG RE SUPP: 650.0000 mg | Freq: Four times a day (QID) | RECTAL | Status: DC | PRN

## 2016-03-30 MED ORDER — OXYCODONE HCL 5 MG PO TABS
ORAL_TABLET | ORAL | Status: AC
  Filled 2016-03-30: qty 2

## 2016-03-30 MED ORDER — ACETAMINOPHEN 325 MG PO TABS: 650.0000 mg | ORAL_TABLET | Freq: Four times a day (QID) | ORAL | Status: DC | PRN

## 2016-03-30 MED ORDER — ROCURONIUM BROMIDE 100 MG/10ML IV SOLN
INTRAVENOUS | Status: DC | PRN
  Administered 2016-03-30: 30 mg via INTRAVENOUS
  Administered 2016-03-30: 40 mg via INTRAVENOUS

## 2016-03-30 MED ORDER — LIDOCAINE 2% (20 MG/ML) 5 ML SYRINGE
INTRAMUSCULAR | Status: AC
  Filled 2016-03-30: qty 5

## 2016-03-30 SURGICAL SUPPLY — 82 items
BAG DECANTER FOR FLEXI CONT (MISCELLANEOUS) ×2 IMPLANT
BLADE SAW SAG 73X25 THK (BLADE)
BLADE SAW SGTL 73X25 THK (BLADE) IMPLANT
BRUSH FEMORAL CANAL (MISCELLANEOUS) IMPLANT
COVER SURGICAL LIGHT HANDLE (MISCELLANEOUS) ×2 IMPLANT
CUP ACETABULAR GRIPTON 100 52 (Orthopedic Implant) ×1 IMPLANT
DECANTER SPIKE VIAL GLASS SM (MISCELLANEOUS) ×2 IMPLANT
DRAPE C-ARM 42X72 X-RAY (DRAPES) IMPLANT
DRAPE ORTHO SPLIT 77X108 STRL (DRAPES) ×2
DRAPE PROXIMA HALF (DRAPES) ×4 IMPLANT
DRAPE SURG ORHT 6 SPLT 77X108 (DRAPES) ×2 IMPLANT
DRAPE U-SHAPE 47X51 STRL (DRAPES) ×2 IMPLANT
DRILL BIT 7/64X5 (BIT) ×2 IMPLANT
DRSG AQUACEL AG ADV 3.5X10 (GAUZE/BANDAGES/DRESSINGS) IMPLANT
DRSG AQUACEL AG ADV 3.5X14 (GAUZE/BANDAGES/DRESSINGS) ×2 IMPLANT
DURAPREP 26ML APPLICATOR (WOUND CARE) ×4 IMPLANT
ELECT BLADE 4.0 EZ CLEAN MEGAD (MISCELLANEOUS) ×2
ELECT BLADE 6.5 EXT (BLADE) IMPLANT
ELECT REM PT RETURN 9FT ADLT (ELECTROSURGICAL) ×2
ELECTRODE BLDE 4.0 EZ CLN MEGD (MISCELLANEOUS) ×1 IMPLANT
ELECTRODE REM PT RTRN 9FT ADLT (ELECTROSURGICAL) ×1 IMPLANT
ELIMINATOR HOLE APEX DEPUY (Hips) ×2 IMPLANT
EVACUATOR 1/8 PVC DRAIN (DRAIN) IMPLANT
GAUZE SPONGE 4X4 12PLY STRL (GAUZE/BANDAGES/DRESSINGS) IMPLANT
GAUZE XEROFORM 5X9 LF (GAUZE/BANDAGES/DRESSINGS) IMPLANT
GLOVE BIO SURGEON STRL SZ7.5 (GLOVE) ×2 IMPLANT
GLOVE BIO SURGEON STRL SZ8.5 (GLOVE) ×2 IMPLANT
GLOVE BIOGEL PI IND STRL 6.5 (GLOVE) ×1 IMPLANT
GLOVE BIOGEL PI IND STRL 7.0 (GLOVE) ×1 IMPLANT
GLOVE BIOGEL PI IND STRL 8 (GLOVE) ×3 IMPLANT
GLOVE BIOGEL PI IND STRL 9 (GLOVE) ×1 IMPLANT
GLOVE BIOGEL PI INDICATOR 6.5 (GLOVE) ×1
GLOVE BIOGEL PI INDICATOR 7.0 (GLOVE) ×1
GLOVE BIOGEL PI INDICATOR 8 (GLOVE) ×3
GLOVE BIOGEL PI INDICATOR 9 (GLOVE) ×1
GLOVE ORTHO TXT STRL SZ7.5 (GLOVE) ×2 IMPLANT
GLOVE SURG SS PI 6.5 STRL IVOR (GLOVE) ×2 IMPLANT
GLOVE SURG SS PI 7.0 STRL IVOR (GLOVE) ×2 IMPLANT
GLOVE SURG SS PI 7.5 STRL IVOR (GLOVE) ×2 IMPLANT
GOWN STRL REUS W/ TWL LRG LVL3 (GOWN DISPOSABLE) ×2 IMPLANT
GOWN STRL REUS W/ TWL XL LVL3 (GOWN DISPOSABLE) ×3 IMPLANT
GOWN STRL REUS W/TWL LRG LVL3 (GOWN DISPOSABLE) ×2
GOWN STRL REUS W/TWL XL LVL3 (GOWN DISPOSABLE) ×3
GRIPTON 100 52 (Orthopedic Implant) ×2 IMPLANT
HANDPIECE INTERPULSE COAX TIP (DISPOSABLE) ×1
HEAD M SROM 36MM PLUS 3 (Hips) ×1 IMPLANT
HOOD PEEL AWAY FACE SHEILD DIS (HOOD) ×6 IMPLANT
KIT BASIN OR (CUSTOM PROCEDURE TRAY) ×2 IMPLANT
KIT ROOM TURNOVER OR (KITS) ×2 IMPLANT
LINER NEUTRAL 52X36X52 PLUS 4 (Liner) ×2 IMPLANT
MANIFOLD NEPTUNE II (INSTRUMENTS) ×4 IMPLANT
NEEDLE 22X1 1/2 (OR ONLY) (NEEDLE) ×2 IMPLANT
NS IRRIG 1000ML POUR BTL (IV SOLUTION) ×4 IMPLANT
PACK TOTAL JOINT (CUSTOM PROCEDURE TRAY) ×2 IMPLANT
PAD ARMBOARD 7.5X6 YLW CONV (MISCELLANEOUS) ×4 IMPLANT
PASSER SUT SWANSON 36MM LOOP (INSTRUMENTS) IMPLANT
PRESSURIZER FEMORAL UNIV (MISCELLANEOUS) IMPLANT
SET HNDPC FAN SPRY TIP SCT (DISPOSABLE) ×1 IMPLANT
SLEEVE SURGEON STRL (DRAPES) IMPLANT
SPONGE LAP 18X18 X RAY DECT (DISPOSABLE) ×2 IMPLANT
SROM M HEAD 36MM PLUS 3 (Hips) ×2 IMPLANT
STAPLER VISISTAT 35W (STAPLE) IMPLANT
SUT ETHIBOND 2 V 37 (SUTURE) ×2 IMPLANT
SUT VIC AB 0 CTX 36 (SUTURE) ×1
SUT VIC AB 0 CTX36XBRD ANTBCTR (SUTURE) ×1 IMPLANT
SUT VIC AB 1 CTX 36 (SUTURE) ×2
SUT VIC AB 1 CTX36XBRD ANBCTR (SUTURE) ×2 IMPLANT
SUT VIC AB 2-0 CT1 27 (SUTURE) ×1
SUT VIC AB 2-0 CT1 TAPERPNT 27 (SUTURE) ×1 IMPLANT
SUT VIC AB 3-0 CT1 27 (SUTURE) ×1
SUT VIC AB 3-0 CT1 TAPERPNT 27 (SUTURE) ×1 IMPLANT
SWAB COLLECTION DEVICE MRSA (MISCELLANEOUS) IMPLANT
SYR 20ML ECCENTRIC (SYRINGE) ×2 IMPLANT
SYR 50ML SLIP (SYRINGE) ×2 IMPLANT
SYR BULB IRRIGATION 50ML (SYRINGE) ×2 IMPLANT
SYR CONTROL 10ML LL (SYRINGE) ×2 IMPLANT
TOWEL OR 17X24 6PK STRL BLUE (TOWEL DISPOSABLE) ×2 IMPLANT
TOWEL OR 17X26 10 PK STRL BLUE (TOWEL DISPOSABLE) ×2 IMPLANT
TOWER CARTRIDGE SMART MIX (DISPOSABLE) IMPLANT
TRAY FOLEY CATH 14FR (SET/KITS/TRAYS/PACK) IMPLANT
TUBE ANAEROBIC SPECIMEN COL (MISCELLANEOUS) IMPLANT
WATER STERILE IRR 1000ML POUR (IV SOLUTION) IMPLANT

## 2016-03-30 NOTE — Transfer of Care (Signed)
Immediate Anesthesia Transfer of Care Note  Patient: [name] [name]  Procedure(s) Performed: Procedure(s): RIGHT TOTAL HIP REVISION (Right)  Patient Location: PACU  Anesthesia Type:General  Level of Consciousness: awake, alert , oriented and patient cooperative  Airway & Oxygen Therapy: Patient Spontanous Breathing and Patient connected to nasal cannula oxygen  Post-op Assessment: Report given to RN, Post -op Vital signs reviewed and stable and Patient moving all extremities X 4  Post vital signs: Reviewed and stable  Last Vitals:  Filed Vitals:   03/30/16 0818 03/30/16 1329  BP: 113/70   Pulse: 93   Temp: 37.1 C 36.6 C  Resp: 18     Last Pain:  Filed Vitals:   03/30/16 1331  PainSc: 0-No pain        Complications: No apparent anesthesia complications

## 2016-03-30 NOTE — Anesthesia Procedure Notes (Signed)
Procedure Name: Intubation Date/Time: 03/30/2016 11:34 AM Performed by: Layla Maw Pre-anesthesia Checklist: Patient identified, Patient being monitored, Timeout performed, Emergency Drugs available and Suction available Patient Re-evaluated:Patient Re-evaluated prior to inductionOxygen Delivery Method: Circle System Utilized Preoxygenation: Pre-oxygenation with 100% oxygen Intubation Type: IV induction Ventilation: Mask ventilation without difficulty Laryngoscope Size: Miller and 3 Grade View: Grade I Tube type: Oral Tube size: 7.5 mm Number of attempts: 1 Airway Equipment and Method: Stylet and Patient positioned with wedge pillow Placement Confirmation: ETT inserted through vocal cords under direct vision,  positive ETCO2 and breath sounds checked- equal and bilateral Secured at: 21 cm Tube secured with: Tape Dental Injury: Teeth and Oropharynx as per pre-operative assessment

## 2016-03-30 NOTE — Interval H&P Note (Signed)
History and Physical Interval Note:  03/30/2016 11:20 AM  [name] [name]  has presented today for surgery, with the diagnosis of PAINFUL RIGHT ASR TOTAL HIP ARTHROPLASTY  The various methods of treatment have been discussed with the patient and family. After consideration of risks, benefits and other options for treatment, the patient has consented to  Procedure(s): TOTAL HIP REVISION (Right) as a surgical intervention .  The patient's history has been reviewed, patient examined, no change in status, stable for surgery.  I have reviewed the patient's chart and labs.  Questions were answered to the patient's satisfaction.     Kerin Salen

## 2016-03-30 NOTE — Anesthesia Preprocedure Evaluation (Addendum)
Anesthesia Evaluation  Patient identified by MRN, date of birth, ID band Patient awake    Reviewed: Allergy & Precautions, H&P , NPO status , Patient's Chart, lab work & pertinent test results  History of Anesthesia Complications Negative for: history of anesthetic complications  Airway Mallampati: II  TM Distance: >3 FB Neck ROM: full    Dental no notable dental hx. (+) Dental Advisory Given, Teeth Intact   Pulmonary shortness of breath and with exertion, sleep apnea , former smoker,    Pulmonary exam normal breath sounds clear to auscultation       Cardiovascular Exercise Tolerance: Poor hypertension, Pt. on medications negative cardio ROS Normal cardiovascular exam Rhythm:regular Rate:Normal     Neuro/Psych  Headaches, TIA x 2 in May 2017. Numbness in bottom lip TIA Neuromuscular disease CVA, Residual Symptoms negative psych ROS   GI/Hepatic negative GI ROS, Neg liver ROS, GERD  Medicated,  Endo/Other  diabetes, Well Controlled, Type 2Morbid obesity  Renal/GU negative Renal ROS Bladder dysfunction  incontinence negative genitourinary   Musculoskeletal  (+) Arthritis , Osteoarthritis,    Abdominal (+) + obese,   Peds  Hematology negative hematology ROS (+)   Anesthesia Other Findings   Reproductive/Obstetrics negative OB ROS                         01/31/16 EKG: Sinus tachycardia Borderline left axis deviation Anterior infarct, old Baseline wander in lead(s) II III aVL aVF V3 V4 V5   01/31/16 Echo: - Normal LV size with moderate LV hypertrophy. EF 55-60%. Normal RV size and systolic function. No significant valvular abnormalities  BP Readings from Last 3 Encounters:  03/19/16 136/71  02/28/16 112/63  02/26/16 124/65   Lab Results  Component Value Date   WBC 11.2* 03/19/2016   HGB 12.7 03/19/2016   HCT 40.0 03/19/2016   MCV 83.2 03/19/2016   PLT 305 03/19/2016      Chemistry      Component Value Date/Time   NA 137 03/19/2016 0946   K 4.3 03/19/2016 0946   CL 104 03/19/2016 0946   CO2 26 03/19/2016 0946   BUN 15 03/19/2016 0946   CREATININE 0.78 03/19/2016 0946   CREATININE 1.09 05/29/2011 1436      Component Value Date/Time   CALCIUM 8.9 03/19/2016 0946   ALKPHOS 110 01/31/2016 1156   AST 18 01/31/2016 1156   ALT 12* 01/31/2016 1156   BILITOT 0.5 01/31/2016 1156     Lab Results  Component Value Date   HGBA1C 6.2* 01/31/2016    Anesthesia Physical Anesthesia Plan  ASA: III  Anesthesia Plan: General   Post-op Pain Management:    Induction: Intravenous  Airway Management Planned: Oral ETT  Additional Equipment:   Intra-op Plan:   Post-operative Plan: Extubation in OR  Informed Consent: I have reviewed the patients History and Physical, chart, labs and discussed the procedure including the risks, benefits and alternatives for the proposed anesthesia with the patient or authorized representative who has indicated his/her understanding and acceptance.   Dental Advisory Given and Dental advisory given  Plan Discussed with: CRNA  Anesthesia Plan Comments:        Anesthesia Quick Evaluation                                  Anesthesia Evaluation

## 2016-03-30 NOTE — Discharge Instructions (Signed)

## 2016-03-30 NOTE — Telephone Encounter (Signed)
LVM for pt to call back. Please relay Dr Cathren Laine message below if pt calls.

## 2016-03-30 NOTE — Telephone Encounter (Signed)
Reviewed her MRI and also reviewed it with our stroke team. The 30-day monitor was normal and we don;t think she needs any more monitoring as far a further tests. Have her follow up with me as regularly scheduled thanks

## 2016-03-30 NOTE — Op Note (Signed)
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  Surgeon: Kathalene Frames. Mayer Camel M.D.  Assistant: Kerry Hough. Barton Dubois  (present throughout entire procedure and necessary for timely completion of the procedure)  Estimated blood loss: 300 cc  Fluid replacement: 1600 cc of crystalloid  Complications: None  Indications: Patient with an ASR on S-ROM total hip that did very well until a few months ago when he had increasing groin pain. The pain wakes him up at night and recently got to the point where he could no longer go walking on a regular basis. MRI scan showed fluid collection, but no bony destruction. Plain x-rays show no change in the position of the components and the stem appears to be well ingrown. Risks and benefits of revision surgery have been discussed and questions answered.  Procedure: Patient was identified by arm band receive preoperative IV antibiotics in the holding area at, and hospital. He was then taken to the operating room where the appropriate anesthetic monitors were attached and general endotracheal anesthesia induced with the patient in the supine position. He was then rolled into the L lateral decubitus position and fixed there with a mark 2 pelvic clamp. A Foley catheter was inserted and the limb prepped and draped in usual sterile fashion from the ankle to the hemipelvis. Time out procedure performed. Skin along the lateral hip and thigh infiltrated with 10 cc of 1/2% Marcaine and epinephrine solution. We began the operation by recreating the old posterior lateral incision 20 cm in line through the skin and subcutaneous tissue down to the level of the IT band which was cut in line with the skin incision. There was no pseudocyst or  pseudotumor. We then remove scar tissue from around to the ASR cup and femoral stem trunnion, dislocated a total hip and removed the ASR head with a mallet and metal cylinder. The trunnion was then tucked anterior and superior to the acetabulum and we continued to remove scar tissue from around the acetabular component. A posterior inferior wing retractor was hammered into place.  We then used the short Innomed curved osteotome around the edge of the cup and at that point it came out relatively easily indicating no and growth. Very little bone was noticed on the ingrowth surface of the cup and fibrous tissue is then stripped from the acetabulum. We reamed up to a 51 mm basket reamer obtaining good coverage in all quadrants irrigated with normal saline solution. We then hammered into place a 52 mm Gryption cup in 45 of abduction and 20 of anteversion. A central occluder was screwed into place followed by a 10 polyethylene liner index posterior and superior. A trial reduction was then performed with a +3 36 mm femoral head instability was noted to 90 of flexion 60 of internal rotation and in full extension the hip could not be dislocated with external rotation. At this point a real +0 36 mm ceramic head was hammered into place, the hip reduced and irrigated with normal saline solution. The capsular flap was repaired back to the intertrochanteric crest through drill holes with #2 Ethibond suture. We then closed the IT band with running #1 Vicryl suture, the subcutaneous tissue with 0 and 2-0 undyed Vicryl suture, the skin was closed  with running interlocking 3-0 nylon suture. A dressing of Mepilex was then applied, the patient was unclamped a rolled supine awakened extubated and taken to the recovery without difficulty.

## 2016-03-31 ENCOUNTER — Encounter (HOSPITAL_COMMUNITY): Payer: Self-pay | Admitting: Orthopedic Surgery

## 2016-03-31 LAB — CBC
HCT: 32.8 % — ABNORMAL LOW (ref 36.0–46.0)
Hemoglobin: 10.1 g/dL — ABNORMAL LOW (ref 12.0–15.0)
MCH: 26 pg (ref 26.0–34.0)
MCHC: 30.8 g/dL (ref 30.0–36.0)
MCV: 84.3 fL (ref 78.0–100.0)
PLATELETS: 238 10*3/uL (ref 150–400)
RBC: 3.89 MIL/uL (ref 3.87–5.11)
RDW: 15.3 % (ref 11.5–15.5)
WBC: 13.1 10*3/uL — AB (ref 4.0–10.5)

## 2016-03-31 NOTE — Progress Notes (Signed)
Physical Therapy Treatment Patient Details Name: [name] [name] MRN: [id number] DOB: 06/02/1951 Today's Date: 03/31/2016    History of Present Illness Patient is a 65 y/o female with hx of HTN, DM, right THA, obesity, CVA presents s/p Right THA revision.    PT Comments    Patient tolerated gait training and therex well. Recalled 3/3 hip precautions. Continue to progress as tolerated with anticipated d/c home with HHPT.   Follow Up Recommendations  Home health PT;Supervision for mobility/OOB     Equipment Recommendations  Rolling walker with 5" wheels    Recommendations for Other Services OT consult     Precautions / Restrictions Precautions Precautions: Posterior Hip Precaution Booklet Issued: No Precaution Comments: Reviewed precautions. Restrictions Weight Bearing Restrictions: Yes RLE Weight Bearing: Weight bearing as tolerated    Mobility  Bed Mobility               General bed mobility comments: OOB in chair upon arrival  Transfers Overall transfer level: Needs assistance Equipment used: Rolling walker (2 wheeled) Transfers: Sit to/from Stand Sit to Stand: Min guard         General transfer comment: min guard for safety; no physical assist needed; cues for hand placement  Ambulation/Gait Ambulation/Gait assistance: Min guard Ambulation Distance (Feet): 120 Feet Assistive device: Rolling walker (2 wheeled) Gait Pattern/deviations: Step-through pattern;Decreased stance time - right;Decreased step length - left;Antalgic Gait velocity: decreased   General Gait Details: cues for posture, bilat heel strike and step length; encouraged to increase WB on R LE and relax shoulders   Stairs            Wheelchair Mobility    Modified Rankin (Stroke Patients Only)       Balance     Sitting balance-Leahy Scale: Good       Standing balance-Leahy Scale: Poor                      Cognition Arousal/Alertness: Awake/alert Behavior  During Therapy: WFL for tasks assessed/performed Overall Cognitive Status: Within Functional Limits for tasks assessed                      Exercises Total Joint Exercises Heel Slides: AAROM;Right;10 reps;Seated Hip ABduction/ADduction: AAROM;Right;10 reps;Seated    General Comments        Pertinent Vitals/Pain Pain Assessment: 0-10 Pain Score: 3  Pain Location: R hip Pain Descriptors / Indicators: Sore Pain Intervention(s): Limited activity within patient's tolerance;Monitored during session;Premedicated before session;Repositioned    Home Living Family/patient expects to be discharged to:: Private residence Living Arrangements: Alone                  Prior Function            PT Goals (current goals can now be found in the care plan section) Acute Rehab PT Goals Patient Stated Goal: to go home Progress towards PT goals: Progressing toward goals    Frequency  7X/week    PT Plan Current plan remains appropriate    Co-evaluation             End of Session Equipment Utilized During Treatment: Gait belt Activity Tolerance: Patient tolerated treatment well Patient left: in chair;with call bell/phone within reach     Time: 7561-2548 PT Time Calculation (min) (ACUTE ONLY): 20 min  Charges:  $Gait Training: 8-22 mins  G Codes:      Salina April, PTA Pager: [phone]   03/31/2016, 4:35 PM

## 2016-03-31 NOTE — Progress Notes (Signed)
Orthopedic Tech Progress Note Patient Details:  [name] [name] September 06, 1951 [id number]  Patient ID: Jefm Bryant, female   DOB: 31-Oct-1950, 65 y.o.   MRN: [id number]   Sherron Ales Trapeze bar to bed. 03/31/2016, 9:40 AM

## 2016-03-31 NOTE — Evaluation (Signed)
Physical Therapy Evaluation Patient Details Name: [name] [name] MRN: [id number] DOB: 08/28/1951 Today's Date: 03/31/2016   History of Present Illness  Patient is a 65 y/o female with hx of HTN, DM, right THA, obesity, CVA presents s/p Right THA revision.  Clinical Impression  Patient presents with pain and post surgical deficits RLE s/p R THA revision. Tolerated gait training with min guard assist for safety. Pt has difficulty with bed mobility due to pain and weakness in RLE. Pt plans to discharge home with support from aunt. Reviewed posterior hip precautions and exercises. Will follow acutely to maximize independence and mobility prior to return home.     Follow Up Recommendations Home health PT;Supervision for mobility/OOB    Equipment Recommendations  Rolling walker with 5" wheels    Recommendations for Other Services OT consult     Precautions / Restrictions Precautions Precautions: Posterior Hip Precaution Booklet Issued: No Precaution Comments: Reviewed precautions. Restrictions Weight Bearing Restrictions: Yes RLE Weight Bearing: Weight bearing as tolerated      Mobility  Bed Mobility Overal bed mobility: Needs Assistance Bed Mobility: Supine to Sit     Supine to sit: Min assist;HOB elevated     General bed mobility comments: Assist to bring RLE to EOB, increased time. Use of trapeze bar and rail.  Transfers Overall transfer level: Needs assistance Equipment used: Rolling walker (2 wheeled) Transfers: Sit to/from Stand Sit to Stand: Min guard         General transfer comment: Min guard to stand from EOB x1, from toilet x1 using grab bar. Cues for technique. Transferred to chair post ambulation bout.  Ambulation/Gait Ambulation/Gait assistance: Min guard Ambulation Distance (Feet): 100 Feet Assistive device: Rolling walker (2 wheeled) Gait Pattern/deviations: Step-through pattern;Decreased stance time - right;Decreased step length - left;Trunk  flexed;Antalgic Gait velocity: decreased   General Gait Details: Slow, unsteady gait with cues for knee extension during stance phase to activate quads.   Stairs            Wheelchair Mobility    Modified Rankin (Stroke Patients Only)       Balance Overall balance assessment: Needs assistance Sitting-balance support: Feet supported;No upper extremity supported Sitting balance-Leahy Scale: Good Sitting balance - Comments: Able to perform pericare without difficulties.    Standing balance support: During functional activity Standing balance-Leahy Scale: Poor Standing balance comment: Reliant on RW for support.                             Pertinent Vitals/Pain Pain Assessment: 0-10 Pain Score: 5  Pain Location: right hip with mobilty Pain Descriptors / Indicators: Sore;Aching Pain Intervention(s): Monitored during session;Repositioned;Limited activity within patient's tolerance;Premedicated before session    Home Living Family/patient expects to be discharged to:: Private residence Living Arrangements: Alone Available Help at Discharge: Family;Available PRN/intermittently Type of Home: House Home Access: Stairs to enter   Entrance Stairs-Number of Steps: 1 Home Layout: One level Home Equipment: Walker - standard;Shower seat;Grab bars - tub/shower      Prior Function Level of Independence: Independent         Comments: Uses walker at times when right hip pain is intolerable.  Drives. Eats out a lot. No falls. Aunt plans to stay with her for 2 weeks post surgery.     Hand Dominance        Extremity/Trunk Assessment   Upper Extremity Assessment: Defer to OT evaluation  Lower Extremity Assessment: RLE deficits/detail;LLE deficits/detail RLE Deficits / Details: Limited AROM/strength secondary to surgery/pain       Communication   Communication: No difficulties  Cognition Arousal/Alertness: Awake/alert Behavior During Therapy:  WFL for tasks assessed/performed Overall Cognitive Status: Within Functional Limits for tasks assessed                      General Comments      Exercises Total Joint Exercises Ankle Circles/Pumps: Both;10 reps;Supine Quad Sets: Both;10 reps;Supine Gluteal Sets: Both;10 reps;Supine      Assessment/Plan    PT Assessment Patient needs continued PT services  PT Diagnosis Difficulty walking;Acute pain   PT Problem List Decreased strength;Pain;Cardiopulmonary status limiting activity;Decreased range of motion;Impaired sensation;Decreased activity tolerance;Decreased balance;Decreased mobility;Decreased knowledge of precautions  PT Treatment Interventions Balance training;Gait training;Functional mobility training;Therapeutic activities;Therapeutic exercise;Patient/family education;Stair training;DME instruction   PT Goals (Current goals can be found in the Care Plan section) Acute Rehab PT Goals Patient Stated Goal: to go home PT Goal Formulation: With patient Time For Goal Achievement: 04/14/16 Potential to Achieve Goals: Good    Frequency 7X/week   Barriers to discharge Decreased caregiver support lives alone but will have aunt living with her for 2 weeks to assist.    Co-evaluation               End of Session Equipment Utilized During Treatment: Gait belt Activity Tolerance: Patient tolerated treatment well Patient left: in chair;with call bell/phone within reach Nurse Communication: Mobility status         Time: 1002-1029 PT Time Calculation (min) (ACUTE ONLY): 27 min   Charges:   PT Evaluation $PT Eval Moderate Complexity: 1 Procedure PT Treatments $Gait Training: 8-22 mins   PT G Codes:        Kelton Bultman A Darelle Kings 03/31/2016, 11:32 AM Wray Kearns, PT, DPT [phone]

## 2016-03-31 NOTE — Progress Notes (Addendum)
Patient ID: [name] [name], female   DOB: 1951/01/06, 65 y.o.   MRN: [id number] PATIENT ID: [name] [name]  MRN: [id number]  DOB/AGE:  1951-02-09 / 65 y.o.  1 Day Post-Op Procedure(s) (LRB): RIGHT TOTAL HIP REVISION (Right)    PROGRESS NOTE Subjective: Patient is alert, oriented, x1 Nausea, no Vomiting, yes passing gas, . Taking PO well. Denies SOB, Chest or Calf Pain. Using Incentive Spirometer, PAS in place. Ambulate WBAT Patient reports pain as  4/10  .    Objective: Vital signs in last 24 hours: Filed Vitals:   03/30/16 1751 03/30/16 2008 03/31/16 0030 03/31/16 0521  BP: 122/62 106/62 116/58 110/54  Pulse:  85 84 89  Temp:  98.8 F (37.1 C) 99.9 F (37.7 C) 99.5 F (37.5 C)  TempSrc:  Oral Oral Oral  Resp:  _0 Height:      Weight:      SpO2:  100% 98% 96%      Intake/Output from previous day: I/O last 3 completed shifts: In: 2097.9 [I.V.:2097.9] Out: 400 [Urine:300; Blood:100]   Intake/Output this shift:     LABORATORY DATA:  Recent Labs  03/30/16 0820 03/30/16 1057 03/30/16 1332 03/31/16 0423  WBC  --   --   --  13.1*  HGB  --   --   --  10.1*  HCT  --   --   --  32.8*  PLT  --   --   --  238  GLUCAP 180* 120* 114*  --     Examination: Neurologically intact ABD soft Neurovascular intact Sensation intact distally Intact pulses distally Dorsiflexion/Plantar flexion intact Incision: scant drainage and moderate drainage No cellulitis present Compartment soft} XR AP&Lat of hip shows well placed\fixed THA  Assessment:   1 Day Post-Op Procedure(s) (LRB): RIGHT TOTAL HIP REVISION (Right) ADDITIONAL DIAGNOSIS:  Expected Acute Blood Loss Anemia, Diabetes, Hypertension and obesitty  Plan: PT/OT WBAT, THA  DVT Prophylaxis: SCDx72 hrs, ASA 325 mg BID x 2 weeks, Overhead bar with trapeze to help patient in and out of bed.  DISCHARGE PLAN: Home  DISCHARGE NEEDS: HHPT, Walker and 3-in-1 comode seat

## 2016-04-01 LAB — CBC
HCT: 29.2 % — ABNORMAL LOW (ref 36.0–46.0)
HEMOGLOBIN: 9.6 g/dL — AB (ref 12.0–15.0)
MCH: 27.1 pg (ref 26.0–34.0)
MCHC: 32.9 g/dL (ref 30.0–36.0)
MCV: 82.5 fL (ref 78.0–100.0)
PLATELETS: 221 10*3/uL (ref 150–400)
RBC: 3.54 MIL/uL — ABNORMAL LOW (ref 3.87–5.11)
RDW: 15.1 % (ref 11.5–15.5)
WBC: 15.7 10*3/uL — ABNORMAL HIGH (ref 4.0–10.5)

## 2016-04-01 MED ORDER — ASPIRIN EC 325 MG PO TBEC: 325.0000 mg | DELAYED_RELEASE_TABLET | Freq: Two times a day (BID) | ORAL | Status: DC

## 2016-04-01 MED ORDER — OXYCODONE-ACETAMINOPHEN 5-325 MG PO TABS: 1.0000 | ORAL_TABLET | ORAL | Status: DC | PRN

## 2016-04-01 MED ORDER — METHOCARBAMOL 500 MG PO TABS: 500.0000 mg | ORAL_TABLET | Freq: Two times a day (BID) | ORAL | Status: DC

## 2016-04-01 NOTE — Care Management Important Message (Signed)
Important Message  Patient Details  Name: [name] [name] MRN: [id number] Date of Birth: [birth year]   Medicare Important Message Given:  Yes    Loann Quill 04/01/2016, 11:09 AM

## 2016-04-01 NOTE — Evaluation (Signed)
Occupational Therapy Evaluation and Discharge Patient Details Name: [name] [name] MRN: [id number] DOB: 1951-04-09 Today's Date: 04/01/2016    History of Present Illness Patient is a 65 y/o female with hx of HTN, DM, right THA, obesity, CVA presents s/p Right THA revision.   Clinical Impression   Pt was performing ADL and mobility at a modified independent level prior to admission. Educated pt in use of AE for LB bathing and dressing, toilet and shower transfers, safe footwear and transporting items safely with RW. Pt verbalizing understanding of posterior hip precautions related to ADL. Pt is eager to discharge home.     Follow Up Recommendations  No OT follow up    Equipment Recommendations  3 in 1 bedside comode    Recommendations for Other Services       Precautions / Restrictions Precautions Precautions: Posterior Hip Precaution Comments: Pt well aware of hip precautions related to ADL. Restrictions RLE Weight Bearing: Weight bearing as tolerated      Mobility Bed Mobility               General bed mobility comments: OOB in chair upon arrival  Transfers Overall transfer level: Needs assistance Equipment used: Rolling walker (2 wheeled) Transfers: Sit to/from Stand Sit to Stand: Supervision         General transfer comment: no physical assist, cues for technique    Balance     Sitting balance-Leahy Scale: Good       Standing balance-Leahy Scale: Fair Standing balance comment: able to release walker in standing to don front opening gown and wash hands                            ADL Overall ADL's : Needs assistance/impaired Eating/Feeding: Independent;Sitting   Grooming: Wash/dry hands;Standing;Supervision/safety   Upper Body Bathing: Set up;Sitting   Lower Body Bathing: Supervison/ safety;Sit to/from stand Lower Body Bathing Details (indicate cue type and reason): pt to purchase long handled bath sponge, instructed to use reacher  to dry feet Upper Body Dressing : Standing;Supervision/safety   Lower Body Dressing: Supervision/safety;With adaptive equipment;Sit to/from stand;Adhering to hip precautions Lower Body Dressing Details (indicate cue type and reason): pt does not wear socks, has safe footwear that does not require shoe horn, instructed in use of reacher and to dress R LE first and then L. Toilet Transfer: Supervision/safety;RW;Ambulation;BSC   Writer and Hygiene: Supervision/safety;Sit to/from stand   Tub/ Shower Transfer: Walk-in shower;Supervision/safety;Ambulation;Shower seat;Grab Engineer, agricultural Details (indicate cue type and reason): instructed in technique, pt able to verbalize Functional mobility during ADLs: Supervision/safety;Rolling walker General ADL Comments: instructed in how to transport items safely with walker     Vision     Perception     Praxis      Pertinent Vitals/Pain Pain Assessment: Faces Faces Pain Scale: Hurts a little bit Pain Location: R hip Pain Descriptors / Indicators: Sore Pain Intervention(s): Monitored during session;Repositioned     Hand Dominance Right   Extremity/Trunk Assessment Upper Extremity Assessment Upper Extremity Assessment: Overall WFL for tasks assessed   Lower Extremity Assessment Lower Extremity Assessment: Defer to PT evaluation RLE Sensation: history of peripheral neuropathy LLE Sensation: history of peripheral neuropathy       Communication Communication Communication: No difficulties   Cognition Arousal/Alertness: Awake/alert Behavior During Therapy: WFL for tasks assessed/performed Overall Cognitive Status: Within Functional Limits for tasks assessed  General Comments       Exercises       Shoulder Instructions      Home Living Family/patient expects to be discharged to:: Private residence Living Arrangements: Alone Available Help at Discharge:  Family;Available PRN/intermittently Type of Home: House Home Access: Stairs to enter [employer] of Steps: 1   Home Layout: One level     Bathroom Shower/Tub: Occupational psychologist: Handicapped height Bathroom Accessibility: Yes How Accessible: Accessible via walker Home Equipment: Walker - standard;Shower seat;Grab bars - tub/shower;Shower seat - built in;Hand held shower head;Adaptive equipment;Toilet riser Adaptive Equipment: Reacher        Prior Functioning/Environment Level of Independence: Independent        Comments: Uses walker at times when right hip pain is intolerable.  Drives. Eats out a lot. No falls. Aunt plans to stay with her for 2 weeks post surgery.    OT Diagnosis: Generalized weakness;Acute pain   OT Problem List:     OT Treatment/Interventions:      OT Goals(Current goals can be found in the care plan section) Acute Rehab OT Goals Patient Stated Goal: to go home  OT Frequency:     Barriers to D/C:            Co-evaluation              End of Session Equipment Utilized During Treatment: Gait belt;Rolling walker  Activity Tolerance: Patient tolerated treatment well Patient left: in chair;with call bell/phone within reach;with family/visitor present   Time: 1131-1159 OT Time Calculation (min): 28 min Charges:  OT General Charges $OT Visit: 1 Procedure OT Evaluation $OT Eval Low Complexity: 1 Procedure OT Treatments $Self Care/Home Management : 8-22 mins G-Codes:    Malka So 04/01/2016, 12:17 PM  [phone]

## 2016-04-01 NOTE — Telephone Encounter (Signed)
Called [name] [name] back since she has not called back. She stated she is in the hospital, she just had surgery. She might be d/c'd to home today. She is doing well. Relayed Dr Jaynee Eagles message below. I reminded her of f/u scheduled on 9/19 at 830am. She verbalized understanding.

## 2016-04-01 NOTE — Discharge Summary (Signed)
Patient ID: [name] [name] MRN: [id number] DOB/AGE: 03-28-1951 65 y.o.  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 Arthritis   . Sleep apnea     no CPAP since weight loss after bariatric surgery '12  . Hypertension     takes Valsartan-HCTZ daily  . Hyperlipidemia     takes Crestor daily  . TIA (transient ischemic attack)     x 2 in May 2017. Numbness in bottom lip  . History of bronchitis     last time > 5 yrs ago  . Headache     occasionally but not needing to take meds  . Peripheral neuropathy (HCC)     not on any meds  . GERD (gastroesophageal reflux disease)     takes Protonix daily  . Joint pain   . Peripheral edema   . Chronic back pain     spondylosis  . Urinary frequency   . Urinary urgency   . Urinary incontinence   . Nocturia   . Diabetes mellitus without complication (HCC)     diet controlled  . Macular degeneration     "just watching"    Surgeries: Procedure(s): RIGHT TOTAL HIP REVISION on 03/30/2016   Consultants:    Discharged Condition: Improved  Hospital Course: [name] [name] is an 65 y.o. female who was admitted 03/30/2016 for operative treatment of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[place]). Patient has severe unremitting pain that affects sleep, daily activities, and work/hobbies. After pre-op clearance the patient was taken to the operating room on 03/30/2016 and underwent  Procedure(s): RIGHT TOTAL HIP REVISION.    Patient was given perioperative antibiotics: Anti-infectives    Start     Dose/Rate Route Frequency Ordered Stop   03/30/16 0830  vancomycin (VANCOCIN) 1,500 mg in sodium chloride 0.9 % 500 mL IVPB     1,500 mg 250 mL/hr over 120 Minutes Intravenous To ShortStay Surgical 03/27/16 0740 03/30/16  1149       Patient was given sequential compression devices, early ambulation, and chemoprophylaxis to prevent DVT.  Patient benefited maximally from hospital stay and there were no complications.    Recent vital signs: Patient Vitals for the past 24 hrs:  BP Temp Temp src Pulse Resp SpO2  04/01/16 0513 115/60 mmHg 98.2 F (36.8 C) Oral 68 17 99 %  03/31/16 1300 (!) 113/53 mmHg 98.4 F (36.9 C) Oral 97 16 94 %     Recent laboratory studies:  Recent Labs  03/31/16 0423 04/01/16 0434  WBC 13.1* 15.7*  HGB 10.1* 9.6*  HCT 32.8* 29.2*  PLT 238 221     Discharge Medications:     Medication List    STOP taking these medications        amLODipine 5 MG tablet  Commonly known as:  NORVASC     HYDROcodone-acetaminophen 7.5-325 MG tablet  Commonly known as:  NORCO     traMADol 100 MG 24 hr tablet  Commonly known as:  ULTRAM-ER      TAKE these medications        aspirin EC 325 MG tablet  Take 1 tablet (325 mg total) by mouth 2 (two) times daily.     beta carotene w/minerals tablet  Take 1 tablet by mouth daily.     Co  Q-10 200 MG Caps  Take 1 capsule by mouth daily.     etodolac 500 MG tablet  Commonly known as:  LODINE  Take 500 mg by mouth 2 (two) times daily.     losartan 100 MG tablet  Commonly known as:  COZAAR  Take 100 mg by mouth daily.     magnesium oxide 400 MG tablet  Commonly known as:  MAG-OX  Take 400 mg by mouth daily.     methocarbamol 500 MG tablet  Commonly known as:  ROBAXIN  Take 1 tablet (500 mg total) by mouth 2 (two) times daily with a meal.     oxyCODONE-acetaminophen 5-325 MG tablet  Commonly known as:  ROXICET  Take 1 tablet by mouth every 4 (four) hours as needed.     pantoprazole 40 MG tablet  Commonly known as:  PROTONIX  Take 40 mg by mouth daily.     rosuvastatin 20 MG tablet  Commonly known as:  CRESTOR  Take 1 tablet (20 mg total) by mouth daily.     valsartan-hydrochlorothiazide 160-25 MG tablet  Commonly known  as:  DIOVAN-HCT  Take 1 tablet by mouth daily.        Diagnostic Studies: Dg Pelvis Portable  03/30/2016  CLINICAL DATA:  Status post right hip arthroplasty EXAM: PORTABLE PELVIS 1-2 VIEWS COMPARISON:  Pelvis MRI, 11/18/2015 FINDINGS: The femoral and acetabular prosthetic components of a right total hip arthroplasty appear well seated and well aligned on this single AP view. There is no acute fracture or evidence of an operative complication. IMPRESSION: Well-positioned right hip total arthroplasty. Electronically Signed   By: Lajean Manes M.D.   On: 03/30/2016 13:51   Mm Digital Screening Bilateral  03/20/2016  CLINICAL DATA:  Screening. EXAM: DIGITAL SCREENING BILATERAL MAMMOGRAM WITH CAD COMPARISON:  Previous exam(s). ACR Breast Density Category b: There are scattered areas of fibroglandular density. FINDINGS: There are no findings suspicious for malignancy. Images were processed with CAD. IMPRESSION: No mammographic evidence of malignancy. A result letter of this screening mammogram will be mailed directly to the patient. RECOMMENDATION: Screening mammogram in one year. (Code:SM-B-01Y) BI-RADS CATEGORY  1: Negative. Electronically Signed   By: Abelardo Diesel M.D.   On: 03/20/2016 09:42    Disposition: 01-Home or Self Care      Discharge Instructions    Call MD / Call 911    Complete by:  As directed   If you experience chest pain or shortness of breath, CALL 911 and be transported to the hospital emergency room.  If you develope a fever above 101 F, pus (white drainage) or increased drainage or redness at the wound, or calf pain, call your surgeon's office.     Constipation Prevention    Complete by:  As directed   Drink plenty of fluids.  Prune juice may be helpful.  You may use a stool softener, such as Colace (over the counter) 100 mg twice a day.  Use MiraLax (over the counter) for constipation as needed.     Diet - low sodium heart healthy    Complete by:  As directed      Driving  restrictions    Complete by:  As directed   No driving for 2 weeks     Follow the hip precautions as taught in Physical Therapy    Complete by:  As directed      Increase activity slowly as tolerated    Complete by:  As directed  Patient may shower    Complete by:  As directed   You may shower without a dressing once there is no drainage.  Do not wash over the wound.  If drainage remains, cover wound with plastic wrap and then shower.           Follow-up Information    Follow up with Kerin Salen, MD In 2 weeks.   Specialty:  Orthopedic Surgery   Contact information:   [place] [address] [phone]        Signed: Hardin Negus Kenijah Benningfield R 04/01/2016, 8:12 AM

## 2016-04-01 NOTE — Anesthesia Postprocedure Evaluation (Signed)
Anesthesia Post Note  Patient: [name] [name]  Procedure(s) Performed: Procedure(s) (LRB): RIGHT TOTAL HIP REVISION (Right)  Patient location during evaluation: PACU Anesthesia Type: General Level of consciousness: sedated and patient cooperative Pain management: pain level controlled Vital Signs Assessment: post-procedure vital signs reviewed and stable Respiratory status: spontaneous breathing Cardiovascular status: stable Anesthetic complications: no    Last Vitals:  Filed Vitals:   03/31/16 1300 04/01/16 0513  BP: 113/53 115/60  Pulse: 97 68  Temp: 36.9 C 36.8 C  Resp: 16 17    Last Pain:  Filed Vitals:   04/01/16 0513  PainSc: [place]

## 2016-04-01 NOTE — Progress Notes (Signed)
PATIENT ID: [name] [name]  MRN: [id number]  DOB/AGE:  23-Jul-1951 / 65 y.o.  2 Days Post-Op Procedure(s) (LRB): RIGHT TOTAL HIP REVISION (Right)    PROGRESS NOTE Subjective: Patient is alert, oriented, no Nausea, no Vomiting, yes passing gas, . Taking PO well with pt already eating breakfast. Denies SOB, Chest or Calf Pain. Using Incentive Spirometer, PAS in place. Ambulate WBAT with pt walking 150 ft with therapy Patient reports pain as  mild  .    Objective: Vital signs in last 24 hours: Filed Vitals:   03/31/16 0030 03/31/16 0521 03/31/16 1300 04/01/16 0513  BP: 116/58 110/54 113/53 115/60  Pulse: 84 89 97 68  Temp: 99.9 F (37.7 C) 99.5 F (37.5 C) 98.4 F (36.9 C) 98.2 F (36.8 C)  TempSrc: Oral Oral Oral Oral  Resp: _0 Height:      Weight:      SpO2: 98% 96% 94% 99%      Intake/Output from previous day: I/O last 3 completed shifts: In: 1937.9 [P.O.:840; I.V.:1097.9] Out: -    Intake/Output this shift:     LABORATORY DATA:  Recent Labs  03/30/16 0820 03/30/16 1057 03/30/16 1332 03/31/16 0423 04/01/16 0434  WBC  --   --   --  13.1* 15.7*  HGB  --   --   --  10.1* 9.6*  HCT  --   --   --  32.8* 29.2*  PLT  --   --   --  238 221  GLUCAP 180* 120* 114*  --   --     Examination: Neurologically intact Neurovascular intact Sensation intact distally Intact pulses distally Dorsiflexion/Plantar flexion intact No cellulitis present Compartment soft} XR AP&Lat of hip shows well placed\fixed THA  Assessment:   2 Days Post-Op Procedure(s) (LRB): RIGHT TOTAL HIP REVISION (Right) ADDITIONAL DIAGNOSIS:  Expected Acute Blood Loss Anemia, Diabetes, Hypertension and obesity  Plan: PT/OT WBAT, THA  DVT Prophylaxis: SCDx72 hrs, ASA 325 mg BID x 2 weeks  DISCHARGE PLAN: Home  DISCHARGE NEEDS: HHPT, Walker and 3-in-1 comode seat

## 2016-04-01 NOTE — Progress Notes (Signed)
Physical Therapy Treatment Patient Details Name: [name] [name] MRN: [id number] DOB: June 22, 1951 Today's Date: 04/01/2016    History of Present Illness Patient is a 65 y/o female with hx of HTN, DM, right THA, obesity, CVA presents s/p Right THA revision.    PT Comments    Pt performed increased mobility, PTA reviewed stair and HEP for d/c.  Pt to d/c this pm.    Follow Up Recommendations  Home health PT;Supervision for mobility/OOB     Equipment Recommendations  Rolling walker with 5" wheels    Recommendations for Other Services OT consult     Precautions / Restrictions Precautions Precautions: Posterior Hip Precaution Booklet Issued: No Precaution Comments: Pt well aware of hip precautions related to ADL. Restrictions Weight Bearing Restrictions: Yes RLE Weight Bearing: Weight bearing as tolerated    Mobility  Bed Mobility               General bed mobility comments: OOB in chair upon arrival  Transfers Overall transfer level: Needs assistance Equipment used: Rolling walker (2 wheeled) Transfers: Sit to/from Stand Sit to Stand: Supervision         General transfer comment: Cues for sequencing and hand placement.    Ambulation/Gait Ambulation/Gait assistance: Supervision Ambulation Distance (Feet): 350 Feet (required x1 seated and x4 standing rest breaks.  )   Gait Pattern/deviations: Step-through pattern;Antalgic;Decreased stride length Gait velocity: decreased   General Gait Details: Cues for gait symmetry, adjusted RW height for improved fit.  Pt will required youth RW at d/c.  pt reports improved comfort with shoulders with use of shorter RW.     Stairs Stairs: Yes Stairs assistance: Min guard Stair Management: No rails;With walker;Step to pattern;Forwards Number of Stairs: 1 General stair comments: Cues for sequencing and RW placement.    Wheelchair Mobility    Modified Rankin (Stroke Patients Only)       Balance Overall balance  assessment: Needs assistance   Sitting balance-Leahy Scale: Good       Standing balance-Leahy Scale: Fair Standing balance comment: able to release walker in standing to don front opening gown and wash hands                    Cognition Arousal/Alertness: Awake/alert Behavior During Therapy: WFL for tasks assessed/performed Overall Cognitive Status: Within Functional Limits for tasks assessed                      Exercises Total Joint Exercises Ankle Circles/Pumps: 10 reps;Supine;Both Quad Sets: 10 reps;Supine;Right Gluteal Sets: 10 reps;Supine;Both Towel Squeeze: AROM;Both;10 reps;Supine Short Arc Quad: Right;10 reps;Supine;AAROM;AROM Heel Slides: Right;10 reps;Supine;AAROM;AROM Hip ABduction/ADduction: AROM;AAROM;Right;Supine;20 reps;Standing (1x10 standing, 1x10 supine. ) Long Arc Quad: AROM;Right;10 reps;Seated Knee Flexion: AROM;Right;10 reps;Standing Marching in Standing: AROM;Right;10 reps;Standing Standing Hip Extension: AROM;Right;10 reps;Standing    General Comments        Pertinent Vitals/Pain Pain Assessment: 0-10 Pain Score: 4  (Pt more fatigued than painful.  ) Faces Pain Scale: Hurts a little bit Pain Location: R hip Pain Descriptors / Indicators: Sore;Guarding;Grimacing Pain Intervention(s): Monitored during session;Repositioned    Home Living Family/patient expects to be discharged to:: Private residence Living Arrangements: Alone Available Help at Discharge: Family;Available PRN/intermittently Type of Home: House Home Access: Stairs to enter   Home Layout: One level Home Equipment: Walker - standard;Shower seat;Grab bars - tub/shower;Shower seat - built in;Hand held shower head;Adaptive equipment;Toilet riser      Prior Function Level of Independence: Independent  Comments: Uses walker at times when right hip pain is intolerable.  Drives. Eats out a lot. No falls. Aunt plans to stay with her for 2 weeks post surgery.   PT  Goals (current goals can now be found in the care plan section) Acute Rehab PT Goals Patient Stated Goal: to go home Potential to Achieve Goals: Good Progress towards PT goals: Progressing toward goals    Frequency  7X/week    PT Plan Current plan remains appropriate    Co-evaluation             End of Session Equipment Utilized During Treatment: Gait belt Activity Tolerance: Patient tolerated treatment well Patient left: in chair;with call bell/phone within reach     Time: 1200-1228 PT Time Calculation (min) (ACUTE ONLY): 28 min  Charges:  $Gait Training: 8-22 mins $Therapeutic Exercise: 8-22 mins                    G Codes:      Cristela Blue 2016-04-12, 3:14 PM  Governor Rooks, PTA pager [phone]

## 2016-04-02 DIAGNOSIS — Z96652 Presence of left artificial knee joint: Secondary | ICD-10-CM | POA: Diagnosis not present

## 2016-04-02 DIAGNOSIS — M150 Primary generalized (osteo)arthritis: Secondary | ICD-10-CM | POA: Diagnosis not present

## 2016-04-02 DIAGNOSIS — E785 Hyperlipidemia, unspecified: Secondary | ICD-10-CM | POA: Diagnosis not present

## 2016-04-02 DIAGNOSIS — Z87891 Personal history of nicotine dependence: Secondary | ICD-10-CM | POA: Diagnosis not present

## 2016-04-02 DIAGNOSIS — K219 Gastro-esophageal reflux disease without esophagitis: Secondary | ICD-10-CM | POA: Diagnosis not present

## 2016-04-02 DIAGNOSIS — E669 Obesity, unspecified: Secondary | ICD-10-CM | POA: Diagnosis not present

## 2016-04-02 DIAGNOSIS — T84030D Mechanical loosening of internal right hip prosthetic joint, subsequent encounter: Secondary | ICD-10-CM | POA: Diagnosis not present

## 2016-04-02 DIAGNOSIS — Z8673 Personal history of transient ischemic attack (TIA), and cerebral infarction without residual deficits: Secondary | ICD-10-CM | POA: Diagnosis not present

## 2016-04-02 DIAGNOSIS — Z96651 Presence of right artificial knee joint: Secondary | ICD-10-CM | POA: Diagnosis not present

## 2016-04-02 DIAGNOSIS — E1142 Type 2 diabetes mellitus with diabetic polyneuropathy: Secondary | ICD-10-CM | POA: Diagnosis not present

## 2016-04-02 DIAGNOSIS — Z9884 Bariatric surgery status: Secondary | ICD-10-CM | POA: Diagnosis not present

## 2016-04-02 DIAGNOSIS — I10 Essential (primary) hypertension: Secondary | ICD-10-CM | POA: Diagnosis not present

## 2016-04-03 DIAGNOSIS — T84030D Mechanical loosening of internal right hip prosthetic joint, subsequent encounter: Secondary | ICD-10-CM | POA: Diagnosis not present

## 2016-04-03 DIAGNOSIS — E1142 Type 2 diabetes mellitus with diabetic polyneuropathy: Secondary | ICD-10-CM | POA: Diagnosis not present

## 2016-04-03 DIAGNOSIS — M150 Primary generalized (osteo)arthritis: Secondary | ICD-10-CM | POA: Diagnosis not present

## 2016-04-03 DIAGNOSIS — I10 Essential (primary) hypertension: Secondary | ICD-10-CM | POA: Diagnosis not present

## 2016-04-03 DIAGNOSIS — E669 Obesity, unspecified: Secondary | ICD-10-CM | POA: Diagnosis not present

## 2016-04-03 DIAGNOSIS — E785 Hyperlipidemia, unspecified: Secondary | ICD-10-CM | POA: Diagnosis not present

## 2016-04-06 DIAGNOSIS — E1142 Type 2 diabetes mellitus with diabetic polyneuropathy: Secondary | ICD-10-CM | POA: Diagnosis not present

## 2016-04-06 DIAGNOSIS — E669 Obesity, unspecified: Secondary | ICD-10-CM | POA: Diagnosis not present

## 2016-04-06 DIAGNOSIS — E785 Hyperlipidemia, unspecified: Secondary | ICD-10-CM | POA: Diagnosis not present

## 2016-04-06 DIAGNOSIS — M150 Primary generalized (osteo)arthritis: Secondary | ICD-10-CM | POA: Diagnosis not present

## 2016-04-06 DIAGNOSIS — T84030D Mechanical loosening of internal right hip prosthetic joint, subsequent encounter: Secondary | ICD-10-CM | POA: Diagnosis not present

## 2016-04-06 DIAGNOSIS — I10 Essential (primary) hypertension: Secondary | ICD-10-CM | POA: Diagnosis not present

## 2016-04-07 ENCOUNTER — Encounter: Payer: Self-pay | Admitting: Neurology

## 2016-04-07 DIAGNOSIS — I639 Cerebral infarction, unspecified: Secondary | ICD-10-CM

## 2016-04-07 HISTORY — DX: Cerebral infarction, unspecified: I63.9

## 2016-04-08 ENCOUNTER — Emergency Department (HOSPITAL_COMMUNITY): Payer: Medicare Other

## 2016-04-08 ENCOUNTER — Inpatient Hospital Stay (HOSPITAL_COMMUNITY)
Admission: EM | Admit: 2016-04-08 | Discharge: 2016-04-10 | DRG: 041 | Disposition: A | Discharge status: Home Health | Payer: Medicare Other | Attending: Family Medicine | Admitting: Family Medicine

## 2016-04-08 ENCOUNTER — Encounter (HOSPITAL_COMMUNITY): Payer: Self-pay | Admitting: Family Medicine

## 2016-04-08 ENCOUNTER — Telehealth: Payer: Self-pay | Admitting: Neurology

## 2016-04-08 DIAGNOSIS — Z8249 Family history of ischemic heart disease and other diseases of the circulatory system: Secondary | ICD-10-CM

## 2016-04-08 DIAGNOSIS — R202 Paresthesia of skin: Secondary | ICD-10-CM

## 2016-04-08 DIAGNOSIS — R2681 Unsteadiness on feet: Secondary | ICD-10-CM | POA: Diagnosis present

## 2016-04-08 DIAGNOSIS — E1142 Type 2 diabetes mellitus with diabetic polyneuropathy: Secondary | ICD-10-CM | POA: Diagnosis not present

## 2016-04-08 DIAGNOSIS — D7589 Other specified diseases of blood and blood-forming organs: Secondary | ICD-10-CM | POA: Diagnosis present

## 2016-04-08 DIAGNOSIS — I10 Essential (primary) hypertension: Secondary | ICD-10-CM | POA: Diagnosis present

## 2016-04-08 DIAGNOSIS — R4701 Aphasia: Secondary | ICD-10-CM | POA: Diagnosis not present

## 2016-04-08 DIAGNOSIS — M479 Spondylosis, unspecified: Secondary | ICD-10-CM | POA: Diagnosis present

## 2016-04-08 DIAGNOSIS — I639 Cerebral infarction, unspecified: Secondary | ICD-10-CM | POA: Diagnosis not present

## 2016-04-08 DIAGNOSIS — K219 Gastro-esophageal reflux disease without esophagitis: Secondary | ICD-10-CM | POA: Diagnosis present

## 2016-04-08 DIAGNOSIS — G8929 Other chronic pain: Secondary | ICD-10-CM | POA: Diagnosis present

## 2016-04-08 DIAGNOSIS — G459 Transient cerebral ischemic attack, unspecified: Secondary | ICD-10-CM | POA: Diagnosis not present

## 2016-04-08 DIAGNOSIS — M545 Low back pain: Secondary | ICD-10-CM | POA: Diagnosis present

## 2016-04-08 DIAGNOSIS — I82412 Acute embolism and thrombosis of left femoral vein: Secondary | ICD-10-CM | POA: Diagnosis not present

## 2016-04-08 DIAGNOSIS — I6389 Other cerebral infarction: Secondary | ICD-10-CM

## 2016-04-08 DIAGNOSIS — E119 Type 2 diabetes mellitus without complications: Secondary | ICD-10-CM

## 2016-04-08 DIAGNOSIS — T84030D Mechanical loosening of internal right hip prosthetic joint, subsequent encounter: Secondary | ICD-10-CM | POA: Diagnosis not present

## 2016-04-08 DIAGNOSIS — Z794 Long term (current) use of insulin: Secondary | ICD-10-CM

## 2016-04-08 DIAGNOSIS — G4733 Obstructive sleep apnea (adult) (pediatric): Secondary | ICD-10-CM | POA: Diagnosis present

## 2016-04-08 DIAGNOSIS — Z807 Family history of other malignant neoplasms of lymphoid, hematopoietic and related tissues: Secondary | ICD-10-CM

## 2016-04-08 DIAGNOSIS — Z96649 Presence of unspecified artificial hip joint: Secondary | ICD-10-CM | POA: Diagnosis present

## 2016-04-08 DIAGNOSIS — I63412 Cerebral infarction due to embolism of left middle cerebral artery: Secondary | ICD-10-CM | POA: Diagnosis not present

## 2016-04-08 DIAGNOSIS — Z87891 Personal history of nicotine dependence: Secondary | ICD-10-CM

## 2016-04-08 DIAGNOSIS — I82419 Acute embolism and thrombosis of unspecified femoral vein: Secondary | ICD-10-CM | POA: Clinically undetermined

## 2016-04-08 DIAGNOSIS — H3530 Unspecified macular degeneration: Secondary | ICD-10-CM | POA: Diagnosis present

## 2016-04-08 DIAGNOSIS — I6340 Cerebral infarction due to embolism of unspecified cerebral artery: Secondary | ICD-10-CM | POA: Diagnosis not present

## 2016-04-08 DIAGNOSIS — F410 Panic disorder [episodic paroxysmal anxiety] without agoraphobia: Secondary | ICD-10-CM | POA: Diagnosis present

## 2016-04-08 DIAGNOSIS — Z880 Allergy status to penicillin: Secondary | ICD-10-CM

## 2016-04-08 DIAGNOSIS — Z885 Allergy status to narcotic agent status: Secondary | ICD-10-CM

## 2016-04-08 DIAGNOSIS — I152 Hypertension secondary to endocrine disorders: Secondary | ICD-10-CM | POA: Diagnosis present

## 2016-04-08 DIAGNOSIS — Z801 Family history of malignant neoplasm of trachea, bronchus and lung: Secondary | ICD-10-CM

## 2016-04-08 DIAGNOSIS — Z9842 Cataract extraction status, left eye: Secondary | ICD-10-CM

## 2016-04-08 DIAGNOSIS — Z79899 Other long term (current) drug therapy: Secondary | ICD-10-CM

## 2016-04-08 DIAGNOSIS — Z6841 Body Mass Index (BMI) 40.0 and over, adult: Secondary | ICD-10-CM | POA: Diagnosis not present

## 2016-04-08 DIAGNOSIS — I6789 Other cerebrovascular disease: Secondary | ICD-10-CM

## 2016-04-08 DIAGNOSIS — E785 Hyperlipidemia, unspecified: Secondary | ICD-10-CM | POA: Diagnosis present

## 2016-04-08 DIAGNOSIS — E1159 Type 2 diabetes mellitus with other circulatory complications: Secondary | ICD-10-CM | POA: Diagnosis present

## 2016-04-08 DIAGNOSIS — Z96641 Presence of right artificial hip joint: Secondary | ICD-10-CM | POA: Diagnosis present

## 2016-04-08 DIAGNOSIS — E669 Obesity, unspecified: Secondary | ICD-10-CM | POA: Diagnosis not present

## 2016-04-08 DIAGNOSIS — T84038A Mechanical loosening of other internal prosthetic joint, initial encounter: Secondary | ICD-10-CM

## 2016-04-08 DIAGNOSIS — Z96653 Presence of artificial knee joint, bilateral: Secondary | ICD-10-CM | POA: Diagnosis present

## 2016-04-08 DIAGNOSIS — D352 Benign neoplasm of pituitary gland: Secondary | ICD-10-CM | POA: Diagnosis present

## 2016-04-08 DIAGNOSIS — T8172XA Complication of vein following a procedure, not elsewhere classified, initial encounter: Secondary | ICD-10-CM | POA: Diagnosis present

## 2016-04-08 DIAGNOSIS — Z9841 Cataract extraction status, right eye: Secondary | ICD-10-CM

## 2016-04-08 DIAGNOSIS — M150 Primary generalized (osteo)arthritis: Secondary | ICD-10-CM | POA: Diagnosis not present

## 2016-04-08 DIAGNOSIS — Z961 Presence of intraocular lens: Secondary | ICD-10-CM | POA: Diagnosis present

## 2016-04-08 DIAGNOSIS — Z9884 Bariatric surgery status: Secondary | ICD-10-CM

## 2016-04-08 DIAGNOSIS — D649 Anemia, unspecified: Secondary | ICD-10-CM | POA: Diagnosis present

## 2016-04-08 DIAGNOSIS — Z7982 Long term (current) use of aspirin: Secondary | ICD-10-CM

## 2016-04-08 DIAGNOSIS — M1990 Unspecified osteoarthritis, unspecified site: Secondary | ICD-10-CM | POA: Diagnosis present

## 2016-04-08 DIAGNOSIS — Z79891 Long term (current) use of opiate analgesic: Secondary | ICD-10-CM

## 2016-04-08 DIAGNOSIS — Z888 Allergy status to other drugs, medicaments and biological substances status: Secondary | ICD-10-CM

## 2016-04-08 DIAGNOSIS — R200 Anesthesia of skin: Secondary | ICD-10-CM | POA: Diagnosis not present

## 2016-04-08 DIAGNOSIS — I159 Secondary hypertension, unspecified: Secondary | ICD-10-CM | POA: Diagnosis not present

## 2016-04-08 DIAGNOSIS — I517 Cardiomegaly: Secondary | ICD-10-CM | POA: Diagnosis present

## 2016-04-08 HISTORY — DX: Other chronic pain: G89.29

## 2016-04-08 HISTORY — DX: Headache, unspecified: R51.9

## 2016-04-08 HISTORY — DX: Low back pain: M54.5

## 2016-04-08 HISTORY — DX: Spondylosis, unspecified: M47.9

## 2016-04-08 HISTORY — DX: Low back pain, unspecified: M54.50

## 2016-04-08 HISTORY — DX: Headache: R51

## 2016-04-08 HISTORY — DX: Cardiac murmur, unspecified: R01.1

## 2016-04-08 LAB — URINALYSIS, ROUTINE W REFLEX MICROSCOPIC
GLUCOSE, UA: NEGATIVE mg/dL
Hgb urine dipstick: NEGATIVE
Ketones, ur: NEGATIVE mg/dL
LEUKOCYTES UA: NEGATIVE
Nitrite: NEGATIVE
PH: 6 (ref 5.0–8.0)
PROTEIN: NEGATIVE mg/dL
Specific Gravity, Urine: 1.012 (ref 1.005–1.030)

## 2016-04-08 LAB — COMPREHENSIVE METABOLIC PANEL
ALBUMIN: 3.1 g/dL — AB (ref 3.5–5.0)
ALK PHOS: 115 U/L (ref 38–126)
ALT: 12 U/L — AB (ref 14–54)
AST: 16 U/L (ref 15–41)
Anion gap: 6 (ref 5–15)
BILIRUBIN TOTAL: 0.6 mg/dL (ref 0.3–1.2)
BUN: 15 mg/dL (ref 6–20)
CALCIUM: 8.7 mg/dL — AB (ref 8.9–10.3)
CO2: 25 mmol/L (ref 22–32)
CREATININE: 0.84 mg/dL (ref 0.44–1.00)
Chloride: 106 mmol/L (ref 101–111)
GFR calc Af Amer: >60 mL/min (ref >60)
GLUCOSE: 160 mg/dL — AB (ref 65–99)
POTASSIUM: 4.1 mmol/L (ref 3.5–5.1)
Sodium: 137 mmol/L (ref 135–145)
TOTAL PROTEIN: 6.1 g/dL — AB (ref 6.5–8.1)

## 2016-04-08 LAB — DIFFERENTIAL
BASOS ABS: 0 10*3/uL (ref 0.0–0.1)
Basophils Relative: 0 %
EOS ABS: 0.4 10*3/uL (ref 0.0–0.7)
Eosinophils Relative: 4 %
LYMPHS ABS: 1.6 10*3/uL (ref 0.7–4.0)
LYMPHS PCT: 16 %
MONOS PCT: 6 %
Monocytes Absolute: 0.6 10*3/uL (ref 0.1–1.0)
NEUTROS PCT: 74 %
Neutro Abs: 7.8 10*3/uL — ABNORMAL HIGH (ref 1.7–7.7)

## 2016-04-08 LAB — I-STAT CHEM 8, ED
BUN: 17 mg/dL (ref 6–20)
CALCIUM ION: 1.1 mmol/L — AB (ref 1.12–1.23)
CHLORIDE: 103 mmol/L (ref 101–111)
CREATININE: 0.9 mg/dL (ref 0.44–1.00)
Glucose, Bld: 164 mg/dL — ABNORMAL HIGH (ref 65–99)
HCT: 34 % — ABNORMAL LOW (ref 36.0–46.0)
Hemoglobin: 11.6 g/dL — ABNORMAL LOW (ref 12.0–15.0)
Potassium: 4.3 mmol/L (ref 3.5–5.1)
SODIUM: 138 mmol/L (ref 135–145)
TCO2: 26 mmol/L (ref 0–100)

## 2016-04-08 LAB — CBC
HEMATOCRIT: 35.8 % — AB (ref 36.0–46.0)
HEMOGLOBIN: 11.1 g/dL — AB (ref 12.0–15.0)
MCH: 25.9 pg — ABNORMAL LOW (ref 26.0–34.0)
MCHC: 31 g/dL (ref 30.0–36.0)
MCV: 83.6 fL (ref 78.0–100.0)
Platelets: 403 10*3/uL — ABNORMAL HIGH (ref 150–400)
RBC: 4.28 MIL/uL (ref 3.87–5.11)
RDW: 14.7 % (ref 11.5–15.5)
WBC: 10.5 10*3/uL (ref 4.0–10.5)

## 2016-04-08 LAB — APTT: APTT: 26 s (ref 24–37)

## 2016-04-08 LAB — PROTIME-INR
INR: 0.98 (ref 0.00–1.49)
Prothrombin Time: 13.2 seconds (ref 11.6–15.2)

## 2016-04-08 LAB — CBG MONITORING, ED: Glucose-Capillary: 172 mg/dL — ABNORMAL HIGH (ref 65–99)

## 2016-04-08 LAB — I-STAT TROPONIN, ED: TROPONIN I, POC: 0.02 ng/mL (ref 0.00–0.08)

## 2016-04-08 MED ORDER — ASPIRIN EC 325 MG PO TBEC: 325.0000 mg | DELAYED_RELEASE_TABLET | Freq: Two times a day (BID) | ORAL | Status: DC

## 2016-04-08 MED ORDER — ETODOLAC 300 MG PO CAPS
300.0000 mg | ORAL_CAPSULE | Freq: Two times a day (BID) | ORAL | Status: DC
  Administered 2016-04-09 – 2016-04-10 (×3): 300 mg via ORAL
  Filled 2016-04-08 (×4): qty 1

## 2016-04-08 MED ORDER — SODIUM CHLORIDE 0.9 % IV SOLN
Freq: Once | INTRAVENOUS | Status: AC
  Administered 2016-04-08: 22:00:00 via INTRAVENOUS

## 2016-04-08 MED ORDER — VALSARTAN-HYDROCHLOROTHIAZIDE 160-25 MG PO TABS: 1.0000 | ORAL_TABLET | Freq: Every evening | ORAL | Status: DC

## 2016-04-08 MED ORDER — SENNOSIDES-DOCUSATE SODIUM 8.6-50 MG PO TABS: 1.0000 | ORAL_TABLET | Freq: Every evening | ORAL | Status: DC | PRN

## 2016-04-08 MED ORDER — STROKE: EARLY STAGES OF RECOVERY BOOK
Freq: Once | Status: AC
  Administered 2016-04-08: 23:00:00
  Filled 2016-04-08: qty 1

## 2016-04-08 MED ORDER — CALCIUM GLUCONATE 10 % IV SOLN
1.0000 g | Freq: Once | INTRAVENOUS | Status: AC
  Administered 2016-04-08: 1 g via INTRAVENOUS
  Filled 2016-04-08: qty 10

## 2016-04-08 MED ORDER — HYDROCHLOROTHIAZIDE 25 MG PO TABS
25.0000 mg | ORAL_TABLET | Freq: Every day | ORAL | Status: DC
  Administered 2016-04-09: 25 mg via ORAL
  Filled 2016-04-08: qty 1

## 2016-04-08 MED ORDER — MAGNESIUM OXIDE 400 (241.3 MG) MG PO TABS
400.0000 mg | ORAL_TABLET | Freq: Every evening | ORAL | Status: DC
  Administered 2016-04-08 – 2016-04-10 (×3): 400 mg via ORAL
  Filled 2016-04-08 (×3): qty 1

## 2016-04-08 MED ORDER — INSULIN ASPART 100 UNIT/ML ~~LOC~~ SOLN
0.0000 [IU] | Freq: Three times a day (TID) | SUBCUTANEOUS | Status: DC
  Administered 2016-04-09 (×2): 1 [IU] via SUBCUTANEOUS
  Administered 2016-04-10 (×2): 3 [IU] via SUBCUTANEOUS

## 2016-04-08 MED ORDER — ENOXAPARIN SODIUM 40 MG/0.4ML ~~LOC~~ SOLN
40.0000 mg | SUBCUTANEOUS | Status: DC
  Administered 2016-04-08: 40 mg via SUBCUTANEOUS
  Filled 2016-04-08 (×2): qty 0.4

## 2016-04-08 MED ORDER — ROSUVASTATIN CALCIUM 5 MG PO TABS
20.0000 mg | ORAL_TABLET | Freq: Every day | ORAL | Status: DC
  Administered 2016-04-09 – 2016-04-10 (×2): 20 mg via ORAL
  Filled 2016-04-08: qty 1
  Filled 2016-04-08: qty 4
  Filled 2016-04-08: qty 1
  Filled 2016-04-08: qty 4

## 2016-04-08 MED ORDER — PROSIGHT PO TABS
1.0000 | ORAL_TABLET | Freq: Every day | ORAL | Status: DC
  Administered 2016-04-09 – 2016-04-10 (×2): 1 via ORAL
  Filled 2016-04-08 (×2): qty 1

## 2016-04-08 MED ORDER — ETODOLAC 500 MG PO TABS: 500.0000 mg | ORAL_TABLET | Freq: Two times a day (BID) | ORAL | Status: DC

## 2016-04-08 MED ORDER — IRBESARTAN 150 MG PO TABS
150.0000 mg | ORAL_TABLET | Freq: Every day | ORAL | Status: DC
  Administered 2016-04-09: 150 mg via ORAL
  Filled 2016-04-08: qty 1

## 2016-04-08 MED ORDER — ASPIRIN EC 81 MG PO TBEC
81.0000 mg | DELAYED_RELEASE_TABLET | Freq: Every day | ORAL | Status: DC
  Administered 2016-04-09: 81 mg via ORAL
  Filled 2016-04-08: qty 1

## 2016-04-08 MED ORDER — TRAMADOL HCL 50 MG PO TABS
50.0000 mg | ORAL_TABLET | Freq: Four times a day (QID) | ORAL | Status: DC | PRN
  Administered 2016-04-09: 50 mg via ORAL
  Filled 2016-04-08 (×2): qty 1

## 2016-04-08 MED ORDER — ETODOLAC 200 MG PO CAPS
200.0000 mg | ORAL_CAPSULE | Freq: Two times a day (BID) | ORAL | Status: DC
  Administered 2016-04-09 – 2016-04-10 (×3): 200 mg via ORAL
  Filled 2016-04-08 (×4): qty 1

## 2016-04-08 MED ORDER — PANTOPRAZOLE SODIUM 40 MG PO TBEC
40.0000 mg | DELAYED_RELEASE_TABLET | Freq: Every day | ORAL | Status: DC
  Administered 2016-04-09 – 2016-04-10 (×2): 40 mg via ORAL
  Filled 2016-04-08 (×2): qty 1

## 2016-04-08 NOTE — Telephone Encounter (Signed)
Pt called back to advise she spoke with PCP and was advised she needs to be worked up. She said she is doing good now, except she has numbness in the entire left  5th digit of the left hand and feels somewhat anxious. Said she would go to ED but she prefers not to.  Pls call asap.

## 2016-04-08 NOTE — Telephone Encounter (Signed)
Patient called to follow up on email sent last night regarding episode she had, couldn't form words, episode lasted no more than 15 minutes, EMS came, BP was 154/92, episode resolved, she could speak, BP came down, did not go to hospital. States she has been fine all night.

## 2016-04-08 NOTE — Telephone Encounter (Signed)
I called the patient's listed number and left message on answering machine to call me back or to call 911 and go to the hospital

## 2016-04-08 NOTE — ED Notes (Signed)
EDP at bedside.

## 2016-04-08 NOTE — ED Notes (Signed)
MD at bedside.

## 2016-04-08 NOTE — Consult Note (Addendum)
Neurology Consult Note  Reason for Consultation: stroke  Requesting provider: Davonna Belling, MD  CC: slurred speech, numbness left pinky finger  HPI: This is a 65 year old right-handed woman who presents to the emergency department at the suggestion of her outpatient neurologist for follow-up of some slurred speech and left finger numbness. History is obtained directly from the patient who is an excellent historian. I've also reviewed her past medical record at length.  The patient reports that she was doing well until yesterday afternoon at approximately 5 PM. She was at home visiting with her aunts. As she was speaking to them, she suddenly developed changes in her speech. She states that everything was garbled and she had difficulty speaking clearly. Based on her description, it sounds as if she had difficulty formulating sentences as well as some possible slurred speech. This was accompanied by some numbness that was isolated to the pinky finger in her left hand. She states that her speech difficulties lasted for about 15 minutes and then completely resolved. She continues to have some very slight numbness in her left pinky but this has improved significantly. She denies any other symptoms such as vision loss, weakness, vertigo, or new balance problems. Of note, however, she did have a recent stroke which left her with residual numbness around the right side of her mouth and the right tongue. In the emergency room, no objective neurologic deficits were noted on examination. MRI scan of the brain was obtained and showed areas of acute ischemia in the left frontal lobe and this is found that her admission for further workup.   The patient was admitted to [facility] from 01/31/16 through 02/01/16. She presented at the time with numbness around the right side of her mouth as well as numbness in the third, fourth, and fifth digits on her right hand. She was not a candidate for thrombolytic  therapy at that time due to being outside the window for treatment. Imaging revealed an acute lacunar infarction within the left thalamus as well as subacute lacunar infarction in the right splenium of the corpus callosum. 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 She was seen in consultation by neurology (Dr. Sarina Ill). Additional workup included a transthoracic echocardiogram that showed moderate left ventricular hypertrophy with ejection fraction 55-60% with no valvular abnormalities. Carotid Dopplers were performed and showed no hemodynamically significant stenosis. She was not taking any antiplatelet medication prior to that admission and was started on aspirin 325 mg daily. She was also placed on atorvastatin 40 mg daily. It was recommended that she have a 30 day outpatient Holter monitor as an outpatient with one-month follow-up with Dr. Jaynee Eagles.   The patient has a prior history of strokes with known intracranial vascular stenosis. She is followed at [facility] by Dr. Jaynee Eagles. She was seen by Dr. Jaynee Eagles on 02/07/16. She continued to do well with regards to her stroke. She was maintained on aspirin and atorvastatin and discussion of risk factor reduction including control of blood pressure, lipids, and blood glucose was held. 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 She was referred to neurosurgery. It appears that neurosurgery did not feel that this was a pituitary mass and recommended that the  patient see ENT for what they felt was a sphenoid sinus mass. She underwent removal of the mass on 02/28/16 with left total ethmoidectomy, left maxillary antrostomy, left frontal sinusotomy, and  left sphenoidotomy. Subsequent pathology revealed pituitary macroadenoma. Prior to surgery, she had her aspirin reduced to 81 mg daily to reduce risk of perioperative bleeding. She had prolonged cardiac monitoring which did not reveal atrial fibrillation and no further cardiac monitoring was recommended.  More recently, on 03/30/16, the patient underwent revision of a right total hip arthroplasty due to persistent pain and popping in that joint. According to the discharge summary, she was instructed to take aspirin 325 mg 2 times daily. However, the patient reports that she was not doing this. She was still taking 81 mg once daily. However, last night with her symptoms, she did increase this to 325 mg.   PMH:  Past Medical History  Diagnosis Date  . Arthritis   . Sleep apnea     no CPAP since weight loss after bariatric surgery '12  . Hypertension     takes Valsartan-HCTZ daily  . Hyperlipidemia     takes Crestor daily  . TIA (transient ischemic attack)     x 2 in May 2017. Numbness in bottom lip  . History of bronchitis     last time > 5 yrs ago  . Headache     occasionally but not needing to take meds  . Peripheral neuropathy (HCC)     not on any meds  . GERD (gastroesophageal reflux disease)     takes Protonix daily  . Joint pain   . Peripheral edema   . Chronic back pain     spondylosis  . Urinary frequency   . Urinary urgency   . Urinary incontinence   . Nocturia   . Diabetes mellitus without complication (HCC)     diet controlled  . Macular degeneration     "just watching"    PSH:  Past Surgical History  Procedure Laterality Date  . Bariatric surgery  11/24/10    [facility]  . Tonsillectomy    . Abdominal hysterectomy    . Total hip arthroplasty    . Joint replacement      bilateral knees  . Sinus endo w/fusion  02/28/2016    Procedure: ENDOSCOPIC SINUS SURGERY WITH NAVIGATION;  Surgeon: Ruby Cola, MD;  Location: [place];  Service: ENT;;  . Colonoscopy    .  Esophagogastroduodenoscopy      in the 70's  . Cataract surgery Bilateral   . Total hip revision Right 03/30/2016    Procedure: RIGHT TOTAL HIP REVISION;  Surgeon: Frederik Pear, MD;  Location: [place];  Service: Orthopedics;  Laterality: Right;    Family history: Family History  Problem Relation Age of Onset  . Prostate cancer Father   . Lymphoma Father   . Congestive Heart Failure Maternal Grandmother   . Lung cancer Maternal Grandfather     Social history:  Social History   Social History  . Marital Status: Single    Spouse Name: N/A  . Number of Children: 0  . Years of Education: college   Occupational History  . Retired    Social History Main Topics  . Smoking status: Former Research scientist (life sciences)  . Smokeless tobacco: Never Used     Comment: quit smoking 9 yrs ago  . Alcohol Use: 0.0 oz/week    0 Standard drinks or equivalent per week     Comment: Rare  . Drug  Use: No  . Sexual Activity: Not on file   Other Topics Concern  . Not on file   Social History Narrative   Drinks 3 cups of coffee a day     Current outpatient meds: Current Meds  Medication Sig  . aspirin 81 MG tablet Take 81 mg by mouth daily.  . beta carotene w/minerals (OCUVITE) tablet Take 1 tablet by mouth daily.  . Coenzyme Q10 (CO Q-10) 200 MG CAPS Take 1 capsule by mouth daily.  [name] etodolac (LODINE) 500 MG tablet Take 500 mg by mouth 2 (two) times daily.   . magnesium oxide (MAG-OX) 400 MG tablet Take 400 mg by mouth every evening.   . pantoprazole (PROTONIX) 40 MG tablet Take 40 mg by mouth daily.  . rosuvastatin (CRESTOR) 20 MG tablet Take 1 tablet (20 mg total) by mouth daily.  . traMADol (ULTRAM) 50 MG tablet Take 50 mg by mouth every 6 (six) hours as needed for moderate pain.  . valsartan-hydrochlorothiazide (DIOVAN-HCT) 160-25 MG tablet Take 1 tablet by mouth every evening.     Current inpatient meds:  Current Facility-Administered Medications  Medication Dose Route Frequency Provider Last Rate Last  Dose  .  stroke: mapping our early stages of recovery book   Does not apply Once Norval Morton, MD      . Derrill Memo ON 04/09/2016] aspirin EC tablet 81 mg  81 mg Oral Daily Rondell A Smith, MD      . calcium gluconate 1 g in sodium chloride 0.9 % 100 mL IVPB  1 g Intravenous Once Norval Morton, MD   1 g at 04/08/16 2229  . enoxaparin (LOVENOX) injection 40 mg  40 mg Subcutaneous Q24H Norval Morton, MD   40 mg at 04/08/16 2230  . [START ON 04/09/2016] etodolac (LODINE) capsule 300 mg  300 mg Oral BID Norval Morton, MD       And  . Derrill Memo ON 04/09/2016] etodolac (LODINE) capsule 200 mg  200 mg Oral BID Norval Morton, MD      . Derrill Memo ON 04/09/2016] hydrochlorothiazide (HYDRODIURIL) tablet 25 mg  25 mg Oral Daily Rondell A Tamala Julian, MD      . Derrill Memo ON 04/09/2016] insulin aspart (novoLOG) injection 0-9 Units  0-9 Units Subcutaneous TID WC Rondell Charmayne Sheer, MD      . Derrill Memo ON 04/09/2016] irbesartan (AVAPRO) tablet 150 mg  150 mg Oral Daily Rondell A Tamala Julian, MD      . magnesium oxide (MAG-OX) tablet 400 mg  400 mg Oral QPM Norval Morton, MD   400 mg at 04/08/16 2230  . [START ON 04/09/2016] multivitamin (PROSIGHT) tablet 1 tablet  1 tablet Oral Daily Norval Morton, MD      . Derrill Memo ON 04/09/2016] pantoprazole (PROTONIX) EC tablet 40 mg  40 mg Oral Daily Rondell Charmayne Sheer, MD      . Derrill Memo ON 04/09/2016] rosuvastatin (CRESTOR) tablet 20 mg  20 mg Oral Daily Rondell A Tamala Julian, MD      . senna-docusate (Senokot-S) tablet 1 tablet  1 tablet Oral QHS PRN Norval Morton, MD      . traMADol (ULTRAM) tablet 50 mg  50 mg Oral Q6H PRN Norval Morton, MD        Allergies: Allergies  Allergen Reactions  . Atorvastatin Other (See Comments)    Muscle weakness Muscle Aches  . Ambien [Zolpidem] Other (See Comments)    NIGHT TERRORS  . Penicillins Other (See Comments)  Made nose run uncontrollably  . Simvastatin Other (See Comments)    Muscle weakness  . Byetta 10 Mcg Pen [Exenatide] Nausea Only  . Codeine  Nausea And Vomiting    ROS: As per HPI. A full 14-point review of systems was performed and is otherwise Notable for limited range of motion of the right hip following her recent surgery. She states that she has been doing well with recovery, still using a walker for ambulation. However, she reports that physical therapy was supposed to upgrade her to a cane starting today but her current stroke precluded this. She is otherwise without complaints.  PE:  BP 138/74 mmHg  Pulse 87  Temp(Src) 98.1 F (36.7 C) (Oral)  Resp 18  Wt 113.49 kg (250 lb 3.2 oz)  SpO2 99%  General: WDWN, no acute distress. AAO x4. Speech clear, no dysarthria. No aphasia. Follows commands briskly. Affect is bright with congruent mood. Comportment is normal.  HEENT: Normocephalic. Neck supple without LAD. MMM, OP clear. Dentition good. Sclerae anicteric. No conjunctival injection.  CV: Regular, 2/6 murmur RUSB. Carotid pulses full and symmetric, no bruits. Distal pulses 2+ and symmetric.  Lungs: CTAB on anterior exam.  Abdomen: Soft, obese, non-distended. Extremities: No C/C/E. Neuro:  CN: Pupils are equal and round. They are symmetrically reactive from 3-->2 mm. Visual fields full to confrontation with no extinction. EOMI without nystagmus. No reported diplopia. Facial sensation is intact to light touch with the exception of numbness in the right perioral region. Face is symmetric at rest with normal strength and mobility. Hearing is intact to conversational voice. Palate elevates symmetrically and uvula is midline. Voice is normal in tone, pitch and quality. Bilateral SCM and trapezii are 5/5. Tongue is midline with normal bulk and mobility.  Motor: Normal bulk, tone, and strength. R hip movement and strength limited by recent surgery. No tremor or other abnormal movements. No drift.  Sensation: Intact to light touch. Pinprick is diminished along the ulnar surface of the left fifth digit. Pinprick is reduced in BLE to the  level of the knees. Vibration is diminished to above the knees bilaterally, worse distally.  DTRs: 2+, symmetric in the arms. DTRs absent BLE. Toes downgoing bilaterally. No pathologic reflexes.  Coordination: Finger-to-nose is without dysmetria. Finger taps are normal in amplitude and speed, no decrement.  Gait: Deferred at this time. She reports that she is still using a walker following her recent hip surgery.   Labs:  Lab Results  Component Value Date   WBC 10.5 04/08/2016   HGB 11.6* 04/08/2016   HCT 34.0* 04/08/2016   PLT 403* 04/08/2016   GLUCOSE 164* 04/08/2016   CHOL 256* 01/31/2016   TRIG 198* 01/31/2016   HDL 63 01/31/2016   LDLCALC 153* 01/31/2016   ALT 12* 04/08/2016   AST 16 04/08/2016   NA 138 04/08/2016   K 4.3 04/08/2016   CL 103 04/08/2016   CREATININE 0.90 04/08/2016   BUN 17 04/08/2016   CO2 25 04/08/2016   TSH 2.070 02/01/2016   INR 0.98 04/08/2016   HGBA1C 6.2* 01/31/2016    Imaging:  I have personally and independently reviewed the MRI scan of the brain without contrast from 04/08/16. This shows 2 distinct areas of restricted diffusion within the left corona radiata and centrum semiovale ovale, consistent with acute ischemia. Also noted is an old lacunar infarct in the left thalamus. She has extensive T2/FLAIR hyperintensity within the bihemispheric white matter, consistent with chronic small vessel ischemic changes.  I  have personally and reviewed the MRA of the head from 01/31/16. 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 There is good distal filling beyond the stenosis.  Other diagnostic studies:  Transthoracic echocardiogram from 01/31/16 showed moderate left ventricular hypertrophy with ejection fraction 55-60%. Wall motion was noted to be normal. She had grade 1 diastolic dysfunction. The aortic valve was poorly visualized and had moderately calcified leaflets but no stenosis. The mitral valve annulus was mildly  calcified.  Carotid Dopplers from 01/31/16 showed no hemodynamically significant stenosis.  Assessment and Plan:  1. Acute Ischemic Stroke: This is an acute stroke involving the L MCA territory. It is most likely embolic in etiology and could potentially be attributable to her known stenosis in the M2 segment of the left MCA. This would not, however, explain her left pinky numbness. She has undergone extensive cardiac evaluation including 30 day event monitor without any evidence of arrhythmia including atrial fibrillation. Known risk factors for cerebrovascular disease in this patient include hypertension, hyperlipidemia, diabetes, prior stroke, known intracranial asker stenosis, sleep apnea, obesity, age. Her outpatient neurologist requested carotid Dopplers and these have been ordered to be completed during this hospitalization. Given the embolic appearance of her stroke and her recurrent strokes, consideration may be given to transesophageal echocardiogram to better assess the proximal aorta and endocardium for embolic source continue aspirin for now. Given her intracranial stenosis, she may benefit from combination therapy with aspirin and Plavix at least in the short-term. Continue statin with goal LDL less than 70. Ensure adequate glucose control. Allow permissive hypertension in the acute phase, treating only SBP greater than 220 mmHg and/or DBP greater than 110 mmHg. BP can be gradually lowered to goal after 24-48 hours. Avoid fever and hyperglycemia as these can extend the infarct. Avoid hypotonic IVF to minimize exacerbation of post-stroke edema. Initiate rehab services. DVT prophylaxis as needed.   2. Aphasia: Her description is most suggestive of an expressive aphasia. This would be consistent with her acute stroke in the left MCA territory. This has resolved. She has no evidence of any residual language abnormality at the time of my evaluation. This can be observed.  3. Left finger numbness:  This is isolated to the left fifth digit. Again, this does not correlate with the visualized stroke on today's MRI scan. This may not be related to cerebrovascular disease at all and could instead represent a mild peripheral neuropathy. Regardless, this is improved significantly and she has minimal residual at this time. This can be observed.  Thank you very much for the opportunity to this patient in this patient's care. Please feel free to call with any questions or concerns. The stroke service will follow her starting 04/09/16.  I spent a total of 80 minutes on this consultation, including extensive review of her medical records and prior imaging. These were included in the decision-making process.

## 2016-04-08 NOTE — ED Provider Notes (Signed)
Emergency Department Provider Note  Time seen: Approximately 3:49 PM  I have reviewed the triage vital signs and the nursing notes.   HISTORY  Chief Complaint Numbness and Aphasia   HPI [name] [name] is a 65 y.o. female with PMH of TIA, GERD, HLD, and DM presents to the emergency department for evaluation of transient aphasia last night at 5 PM. Patient states she is having conversation with friends when she suddenly had significant difficulty forming words and thinking of her next thought. No associated weakness or numbness in her extremities. EMS was called at which point the symptoms had resolved. She refused transport to the emergency department at that time and went to bed. No additional speech difficulty in the morning but she noticed some mild numbness to her left pinky. She does have some residual numbness around the right side of her mouth and fingers on the right hand. These are unchanged. No difficulty walking or speaking. She has been compliant with her medications. Her aspirin was back from full dose to 81 mg in the setting of a recent hip replacement. She is not on Plavix. She takes Crestor 20 mg daily.     Past Medical History  Diagnosis Date  . Arthritis   . Sleep apnea     no CPAP since weight loss after bariatric surgery '12  . Hypertension     takes Valsartan-HCTZ daily  . Hyperlipidemia     takes Crestor daily  . TIA (transient ischemic attack)     x 2 in May 2017. Numbness in bottom lip  . History of bronchitis     last time > 5 yrs ago  . Headache     occasionally but not needing to take meds  . Peripheral neuropathy (HCC)     not on any meds  . GERD (gastroesophageal reflux disease)     takes Protonix daily  . Joint pain   . Peripheral edema   . Chronic back pain     spondylosis  . Urinary frequency   . Urinary urgency   . Urinary incontinence   . Nocturia   . Diabetes mellitus without complication (HCC)     diet controlled  . Macular  degeneration     "just watching"    Patient Active Problem List   Diagnosis Date Noted  . 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 Stroke ([place]) 01/31/2016  . Stroke-like symptoms 01/31/2016  . Hyperglycemia 01/31/2016  . Diabetes mellitus without complication ([place])   . Hypertension   . Obesity     Past Surgical History  Procedure Laterality Date  . Bariatric surgery  11/24/10    [facility]  . Tonsillectomy    . Abdominal hysterectomy    . Total hip arthroplasty    . Joint replacement      bilateral knees  . Sinus endo w/fusion  02/28/2016    Procedure: ENDOSCOPIC SINUS SURGERY WITH NAVIGATION;  Surgeon: Ruby Cola, MD;  Location: [facility];  Service: ENT;;  . Colonoscopy    . Esophagogastroduodenoscopy      in the 70's  . Cataract surgery Bilateral   . Total hip revision Right 03/30/2016    Procedure: RIGHT TOTAL HIP REVISION;  Surgeon: Frederik Pear, MD;  Location: [facility];  Service: Orthopedics;  Laterality: Right;    Current Outpatient Rx  Name  Route  Sig  Dispense  Refill  . aspirin EC 325 MG tablet   Oral   Take 1 tablet (325 mg total) by mouth 2 (two) times daily.   30 tablet   0   . beta carotene w/minerals (OCUVITE) tablet   Oral   Take 1 tablet by mouth daily.         . Coenzyme Q10 (CO Q-10) 200 MG CAPS   Oral   Take 1 capsule by mouth daily.         [name] etodolac (LODINE) 500 MG tablet   Oral   Take 500 mg by mouth 2 (two) times daily.          [name] losartan (COZAAR) 100 MG tablet   Oral   Take 100 mg by mouth daily.         . magnesium oxide (MAG-OX) 400 MG tablet   Oral   Take 400 mg by mouth daily.         . methocarbamol (ROBAXIN) 500 MG tablet   Oral   Take 1 tablet (500 mg total) by mouth 2 (two) times daily with a meal.   60 tablet   0   . oxyCODONE-acetaminophen  (ROXICET) 5-325 MG tablet   Oral   Take 1 tablet by mouth every 4 (four) hours as needed.   60 tablet   0   . pantoprazole (PROTONIX) 40 MG tablet   Oral   Take 40 mg by mouth daily.         . rosuvastatin (CRESTOR) 20 MG tablet   Oral   Take 1 tablet (20 mg total) by mouth daily.   30 tablet   0   . valsartan-hydrochlorothiazide (DIOVAN-HCT) 160-25 MG tablet   Oral   Take 1 tablet by mouth daily.           Allergies Atorvastatin; Ambien; Penicillins; Simvastatin; Byetta 10 mcg pen; and Codeine  Family History  Problem Relation Age of Onset  . Prostate cancer Father   . Lymphoma Father   . Congestive Heart Failure Maternal Grandmother   . Lung cancer Maternal Grandfather     Social History Social History  Substance Use Topics  . Smoking status: Former Research scientist (life sciences)  . Smokeless tobacco: Never Used     Comment: quit smoking 9 yrs ago  . Alcohol Use: 0.0 oz/week    0 Standard drinks or equivalent per week     Comment: Rare    Review of Systems  Constitutional: No fever/chills Eyes: No visual changes. ENT: No sore throat. Cardiovascular: Denies chest pain. Respiratory: Denies shortness of breath. Gastrointestinal: No abdominal pain.  No nausea, no vomiting.  No diarrhea.  No constipation. Genitourinary: Negative for dysuria. Musculoskeletal: Negative for back pain. Skin: Negative for rash. Neurological: Negative for headaches, focal weakness; Numbness to left pinky and aphasia yesterday.   10-point ROS otherwise negative.  ____________________________________________   PHYSICAL EXAM:  VITAL SIGNS: ED Triage Vitals  Enc Vitals Group     BP 04/08/16 1417 137/70 mmHg     Pulse Rate 04/08/16 1417 96     Resp 04/08/16 1417 18     Temp 04/08/16 1417 98.2 F (36.8 C)     Temp Source 04/08/16 1417 Oral     SpO2 04/08/16 1417 98 %     Pain Score 04/08/16 1525 0   Constitutional: Alert and oriented. Well appearing and in no acute distress. Eyes:  Conjunctivae are normal. PERRL. EOMI. Head: Atraumatic. Nose:  No congestion/rhinnorhea. Mouth/Throat: Mucous membranes are moist.  Oropharynx non-erythematous. Neck: No stridor.   Cardiovascular: Normal rate, regular rhythm. Good peripheral circulation. Grossly normal heart sounds.   Respiratory: Normal respiratory effort.  No retractions. Lungs CTAB. Gastrointestinal: Soft and nontender. No distention.  Musculoskeletal: No lower extremity tenderness nor edema. No gross deformities of extremities. Neurologic:  Normal speech and language. No CN deficits. No gross focal neurologic deficits are appreciated. Subjective numbness to the left 5th digit.  Skin:  Skin is warm, dry and intact. No rash noted. Psychiatric: Mood and affect are normal. Speech and behavior are normal.  ____________________________________________   LABS (all labs ordered are listed, but only abnormal results are displayed)  Labs Reviewed  CBC - Abnormal; Notable for the following:    Hemoglobin 11.1 (*)    HCT 35.8 (*)    MCH 25.9 (*)    Platelets 403 (*)    All other components within normal limits  DIFFERENTIAL - Abnormal; Notable for the following:    Neutro Abs 7.8 (*)    All other components within normal limits  COMPREHENSIVE METABOLIC PANEL - Abnormal; Notable for the following:    Glucose, Bld 160 (*)    Calcium 8.7 (*)    Total Protein 6.1 (*)    Albumin 3.1 (*)    ALT 12 (*)    All other components within normal limits  CBG MONITORING, ED - Abnormal; Notable for the following:    Glucose-Capillary 172 (*)    All other components within normal limits  I-STAT CHEM 8, ED - Abnormal; Notable for the following:    Glucose, Bld 164 (*)    Calcium, Ion 1.10 (*)    Hemoglobin 11.6 (*)    HCT 34.0 (*)    All other components within normal limits  PROTIME-INR  APTT  I-STAT TROPOININ, ED   ____________________________________________  EKG  Reviewed in [facility].    ____________________________________________  RADIOLOGY  Ct Head Wo Contrast  04/08/2016  CLINICAL DATA:  Transient a fatal last night. Left hand numbness today. History of hypertension, TIA and diabetes. EXAM: CT HEAD WITHOUT CONTRAST TECHNIQUE: Contiguous axial images were obtained from the base of the skull through the vertex without intravenous contrast. COMPARISON:  Head CT 01/31/2016. MRI brain 01/31/2016 and 02/12/2016. FINDINGS: Brain: There is no evidence of acute intracranial hemorrhage, mass lesion, brain edema or extra-axial fluid collection. The ventricles and subarachnoid spaces are appropriately sized for age. There are extensive chronic small vessel ischemic changes within the periventricular white matter. There is a new component in the posterior left frontal periventricular white matter on axial images 19 and 20 which may be subacute. No evidence of acute cortical based infarct. There has been interval transsphenoidal surgery with partial resection of the intra sellar and sphenoid sinus mass. Vascular: Intracranial vascular calcifications are noted. Skull: Negative for fracture or focal lesion. Sinuses/Orbits: No acute sinus findings are demonstrated. 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 Other: None. IMPRESSION: 1. 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 2. New focal low-density in the left posterior frontal periventricular white matter, potentially a subacute small vessel infarct. Extensive chronic small vessel ischemic changes in the periventricular white matter are otherwise stable. No evidence of acute cortical stroke or hemorrhage. Electronically Signed   By: Richardean Sale M.D.   On: 04/08/2016 15:46    ____________________________________________   PROCEDURES  Procedure(s) performed:    Procedures  None ____________________________________________   INITIAL IMPRESSION / ASSESSMENT AND PLAN / ED COURSE  Pertinent labs & imaging results that were available during my care of the patient were reviewed by me and considered in my medical decision making (see chart for details).  Patient resents to the emergency department for evaluation of aphasia yesterday that is since resolved. She awoke this morning with some numbness in her left fifth digit. Patient had TIA in May of this year with workup including MRI, cardiac echo, carotid evaluation. She is currently on aspirin. Not Plavix. She did have hip replaced in 02/2016. No CODE STROKE. CT head and labs unremarkable at this time. Discussed the case with Neurology, Dr. Birder Robson, who recommends repeat MRI for evaluation of multiple small infarcts. If present he would strongly consider admission for evaluation of thromboembolic disease. If negative, or baseline, would lean towards discharge with home Neurology follow up.   MRI pending. Care transferred to Dr. Alvino Chapel who will follow MRI.  ____________________________________________  FINAL CLINICAL IMPRESSION(S) / ED DIAGNOSES  TIA  MEDICATIONS GIVEN DURING THIS VISIT:  None  NEW OUTPATIENT MEDICATIONS STARTED DURING THIS VISIT:  None   Note:  This document was prepared using Dragon voice recognition software and may include unintentional dictation errors.  Nanda Quinton, MD Emergency Medicine  Margette Fast, MD 04/08/16 [phone]

## 2016-04-08 NOTE — Telephone Encounter (Signed)
Message sent to Dr. Leonie Man.

## 2016-04-08 NOTE — ED Notes (Signed)
Pt here for 15 minute episode of numbness in left hand, and aphasia that was around 5 pm last night. sts EMS came last night and by the time they got to her house symptoms had resolved. sts her CBG was 77. currently no weakness, numbness, slurred speech, no deficits noted. sts she has a little numbness in finger. Hx of TIA and recent surgery/

## 2016-04-08 NOTE — ED Notes (Signed)
Pt. Returned from MRI at this time.

## 2016-04-08 NOTE — Telephone Encounter (Signed)
I called the patient. The patient had an episode yesterday of transient speech disturbance lasting about 15 minutes. It was not associated with any weakness of the extremities or numbness. The patient is in the emergency room now, MRI the brain will be done. 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 The patient does not appear to have had a carotid Doppler study, I will order the study to be done as an outpatient.

## 2016-04-08 NOTE — ED Notes (Signed)
Admitting at bedside.

## 2016-04-08 NOTE — H&P (Signed)
History and Physical    [name] [name] IHW:[id number] DOB: May 20, 1951 DOA: 04/08/2016  Referring MD/NP/PA:  Dr. Alvino Chapel  PCP: Antony Blackbird, MD  Patient coming from: home  Chief Complaint: Numbness in my left hand  HPI: [name] [name] is a 65 y.o. female with medical history significant of HTN, HLD,TIA/stroke, diet controlled DM type 2, peripheral neuropathy,s/p gastric bypass, OSA; who presents with complaints of numbness over the left fifth digit of her hand. Yesterday around 5:15 PM patient reported acute onset of difficulty speaking that lasted 10-15 minutes. She called EMS, however by the time they arrived symptoms had started to self resolve and therefore declined transport. During this time she checked her blood pressure and it was 154/92. Today she awoke and noticed some worsening numbness over her right lower lip and right 3-5 digits of her hand. These were residual symptoms from her previous stroke back in May. She also noted as well as left fifth digit numbness and tingling. The patient also states that she had an episode of anxiety with palpitations that she relates to panic attack for which she took She called her PCP and Dr. Jaynee Eagles of neurologist who urged patient to come in for further evaluation. Patient notes that she just had a stroke back in May of this year. An evaluation at that time it appears that she did not have a carotid Doppler study. Recent changes include that she was started on valsartan-hydrochlorothiazide and discontinued losartan-amlodipine approximately 3 weeks ago by her primary care provider as her blood pressures not very well controlled. Since being on this medicine she states that her blood pressures have been more well controlled, but she has had some symptoms of feeling fatigued. Patient has checked her blood pressure and denies having any low blood pressure readings. Approximately 9 days ago the patient underwent hip revision surgery. Associated symptoms include  fall in her closet in the last month as well as falling into a stool a couple days ago. She reports usually having unsteady gait due to peripheral neuropathy and since her recent surgery she has been walking with the assistance of a rolling walker. Patient denies having any significant focal weakness, changes in vision, dysuria, or chest pain.  ED Course:  On admission into the emergency department patient was evaluated and seen to be afebrile with vital signs relatively within normal limits. Lab work revealed WBC 10.5, hemoglobin 11.1, platelets 403, ionized calcium 1.1, and glucose 160. Patient had CT scan and subsequent MRI; which showed two acute nonhemorrhagic infarct of the left corona radiata and posterior centrum semi ovale. Neurology was consulted in the ER and [facility] called to admit.  Review of Systems: As per HPI otherwise 10 point review of systems negative.   Past Medical History  Diagnosis Date  . Arthritis   . Sleep apnea     no CPAP since weight loss after bariatric surgery '12  . Hypertension     takes Valsartan-HCTZ daily  . Hyperlipidemia     takes Crestor daily  . TIA (transient ischemic attack)     x 2 in May 2017. Numbness in bottom lip  . History of bronchitis     last time > 5 yrs ago  . Headache     occasionally but not needing to take meds  . Peripheral neuropathy (HCC)     not on any meds  . GERD (gastroesophageal reflux disease)     takes Protonix daily  . Joint pain   . Peripheral edema   .  Chronic back pain     spondylosis  . Urinary frequency   . Urinary urgency   . Urinary incontinence   . Nocturia   . Diabetes mellitus without complication (HCC)     diet controlled  . Macular degeneration     "just watching"    Past Surgical History  Procedure Laterality Date  . Bariatric surgery  11/24/10    [facility]  . Tonsillectomy    . Abdominal hysterectomy    . Total hip arthroplasty    . Joint replacement      bilateral knees  . Sinus endo w/fusion   02/28/2016    Procedure: ENDOSCOPIC SINUS SURGERY WITH NAVIGATION;  Surgeon: Ruby Cola, MD;  Location: [place];  Service: ENT;;  . Colonoscopy    . Esophagogastroduodenoscopy      in the 70's  . Cataract surgery Bilateral   . Total hip revision Right 03/30/2016    Procedure: RIGHT TOTAL HIP REVISION;  Surgeon: Frederik Pear, MD;  Location: [place];  Service: Orthopedics;  Laterality: Right;     reports that she has quit smoking. She has never used smokeless tobacco. She reports that she drinks alcohol. She reports that she does not use illicit drugs.  Allergies  Allergen Reactions  . Atorvastatin Other (See Comments)    Muscle weakness Muscle Aches  . Ambien [Zolpidem] Other (See Comments)    NIGHT TERRORS  . Penicillins Other (See Comments)    Made nose run uncontrollably  . Simvastatin Other (See Comments)    Muscle weakness  . Byetta 10 Mcg Pen [Exenatide] Nausea Only  . Codeine Nausea And Vomiting    Family History  Problem Relation Age of Onset  . Prostate cancer Father   . Lymphoma Father   . Congestive Heart Failure Maternal Grandmother   . Lung cancer Maternal Grandfather     Prior to Admission medications   Medication Sig Start Date End Date Taking? Authorizing Provider  aspirin EC 325 MG tablet Take 1 tablet (325 mg total) by mouth 2 (two) times daily. 04/01/16   Leighton Parody, PA-C  beta carotene w/minerals (OCUVITE) tablet Take 1 tablet by mouth daily.    Historical Provider, MD  Coenzyme Q10 (CO Q-10) 200 MG CAPS Take 1 capsule by mouth daily.    Historical Provider, MD  etodolac (LODINE) 500 MG tablet Take 500 mg by mouth 2 (two) times daily.     Historical Provider, MD  losartan (COZAAR) 100 MG tablet Take 100 mg by mouth daily.    Historical Provider, MD  magnesium oxide (MAG-OX) 400 MG tablet Take 400 mg by mouth daily.    Historical Provider, MD  methocarbamol (ROBAXIN) 500 MG tablet Take 1 tablet (500 mg total) by mouth 2 (two) times daily with a meal.  04/01/16   Leighton Parody, PA-C  oxyCODONE-acetaminophen (ROXICET) 5-325 MG tablet Take 1 tablet by mouth every 4 (four) hours as needed. 04/01/16   Leighton Parody, PA-C  pantoprazole (PROTONIX) 40 MG tablet Take 40 mg by mouth daily.    Historical Provider, MD  rosuvastatin (CRESTOR) 20 MG tablet Take 1 tablet (20 mg total) by mouth daily. 02/01/16   Lavina Hamman, MD  valsartan-hydrochlorothiazide (DIOVAN-HCT) 160-25 MG tablet Take 1 tablet by mouth daily.    Historical Provider, MD    Physical Exam:  Constitutional: Obese elderly female, in NAD, calm, comfortable Filed Vitals:   04/08/16 1745 04/08/16 1800 04/08/16 1830 04/08/16 2015  BP: 106/64 126/96 119/70 113/68  Pulse: 87 90 99 88  Temp:      TempSrc:      Resp: _0 SpO2: 100% 99% 99% 99%   Eyes: PERRL, lids and conjunctivae normal. No nystagmus. ENMT: Mucous membranes are moist. Posterior pharynx clear of any exudate or lesions.Normal dentition.  Neck: normal, supple, no masses, no thyromegaly Respiratory: clear to auscultation bilaterally, no wheezing, no crackles. Normal respiratory effort. No accessory muscle use.  Cardiovascular: Regular rate and rhythm, no murmurs / rubs / gallops. +1 pitting edema today right lower extremity up to the distal tibia and no edema on the left lower extremity. 2+ pedal pulses. No carotid bruits.  Abdomen: no tenderness, no masses palpated. No hepatosplenomegaly. Bowel sounds positive.  Musculoskeletal: no clubbing / cyanosis. Decreased range of motion on the right lower extremity.. Normal muscle tone.  Skin: Healing postsurgical wounds of the right hip. Neurologic: CN 2-12 grossly intact. Normal sensation of the right side half of the lower lip, digits 3 through 5 of the right hand, and  fifth digit of the left hand, DTR normal. No drift appreciated.  Psychiatric: Normal judgment and insight. Alert and oriented x 3. Normal mood.     Labs on Admission: I have personally reviewed  following labs and imaging studies  CBC:  Recent Labs Lab 04/08/16 1425 04/08/16 1441  WBC 10.5  --   NEUTROABS 7.8*  --   HGB 11.1* 11.6*  HCT 35.8* 34.0*  MCV 83.6  --   PLT 403*  --    Basic Metabolic Panel:  Recent Labs Lab 04/08/16 1425 04/08/16 1441  NA 137 138  K 4.1 4.3  CL 106 103  CO2 25  --   GLUCOSE 160* 164*  BUN 15 17  CREATININE 0.84 0.90  CALCIUM 8.7*  --    GFR: Estimated Creatinine Clearance: 77.5 mL/min (by C-G formula based on Cr of 0.9). Liver Function Tests:  Recent Labs Lab 04/08/16 1425  AST 16  ALT 12*  ALKPHOS 115  BILITOT 0.6  PROT 6.1*  ALBUMIN 3.1*   No results for input(s): LIPASE, AMYLASE in the last 168 hours. No results for input(s): AMMONIA in the last 168 hours. Coagulation Profile:  Recent Labs Lab 04/08/16 1425  INR 0.98   Cardiac Enzymes: No results for input(s): CKTOTAL, CKMB, CKMBINDEX, TROPONINI in the last 168 hours. BNP (last 3 results) No results for input(s): PROBNP in the last 8760 hours. HbA1C: No results for input(s): HGBA1C in the last 72 hours. CBG:  Recent Labs Lab 04/08/16 1427  GLUCAP 172*   Lipid Profile: No results for input(s): CHOL, HDL, LDLCALC, TRIG, CHOLHDL, LDLDIRECT in the last 72 hours. Thyroid Function Tests: No results for input(s): TSH, T4TOTAL, FREET4, T3FREE, THYROIDAB in the last 72 hours. Anemia Panel: No results for input(s): VITAMINB12, FOLATE, FERRITIN, TIBC, IRON, RETICCTPCT in the last 72 hours. Urine analysis:    Component Value Date/Time   COLORURINE YELLOW 03/19/2016 0945   APPEARANCEUR CLEAR 03/19/2016 0945   LABSPEC 1.012 03/19/2016 0945   PHURINE 6.0 03/19/2016 0945   GLUCOSEU NEGATIVE 03/19/2016 0945   HGBUR NEGATIVE 03/19/2016 0945   BILIRUBINUR MODERATE* 03/19/2016 0945   KETONESUR NEGATIVE 03/19/2016 0945   PROTEINUR NEGATIVE 03/19/2016 0945   UROBILINOGEN 0.2 04/19/2007 1413   NITRITE NEGATIVE 03/19/2016 0945   LEUKOCYTESUR NEGATIVE 03/19/2016  0945   Sepsis Labs: No results found for this or any previous visit (from the past 240 hour(s)).   Radiological Exams on Admission: Ct  Head Wo Contrast  04/08/2016  CLINICAL DATA:  Transient a fatal last night. Left hand numbness today. History of hypertension, TIA and diabetes. EXAM: CT HEAD WITHOUT CONTRAST TECHNIQUE: Contiguous axial images were obtained from the base of the skull through the vertex without intravenous contrast. COMPARISON:  Head CT 01/31/2016. MRI brain 01/31/2016 and 02/12/2016. FINDINGS: Brain: There is no evidence of acute intracranial hemorrhage, mass lesion, brain edema or extra-axial fluid collection. The ventricles and subarachnoid spaces are appropriately sized for age. There are extensive chronic small vessel ischemic changes within the periventricular white matter. There is a new component in the posterior left frontal periventricular white matter on axial images 19 and 20 which may be subacute. No evidence of acute cortical based infarct. There has been interval transsphenoidal surgery with partial resection of the intra sellar and sphenoid sinus mass. Vascular: Intracranial vascular calcifications are noted. Skull: Negative for fracture or focal lesion. Sinuses/Orbits: No acute sinus findings are demonstrated. 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 Other: None. IMPRESSION: 1. 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 2. New focal low-density in the left posterior frontal periventricular white matter, potentially a subacute small vessel infarct. Extensive chronic small vessel ischemic changes in the periventricular white matter are otherwise stable. No evidence of acute cortical stroke or hemorrhage. Electronically Signed   By: Richardean Sale M.D.   On: 04/08/2016 15:46   Mr Brain Wo Contrast  04/08/2016   CLINICAL DATA:  15 minutes episode of numbness in left hand a and aphasia last evening. Symptoms then resolve. Some residual numbness in the hand. EXAM: MRI HEAD WITHOUT CONTRAST TECHNIQUE: Multiplanar, multiecho pulse sequences of the brain and surrounding structures were obtained without intravenous contrast. COMPARISON:  CT head without contrast from the same day. MRI brain 02/12/2016 FINDINGS: Two foci of acute nonhemorrhagic infarction are confirmed within the left corona radiata and posterior centrum semi ovale. No acute cortical infarcts are present. The basal ganglia are intact. Severe periventricular and diffuse subcortical T2 hyperintensities are advanced for age. Multiple remote lacunar infarcts are present in the basal ganglia bilaterally. White matter changes extend into the brainstem as well. Soft tissue involving the left ethmoid area, left cavernous sinus, and left sella turcica is noted. There has been interval surgery and biopsy demonstrating a pituitary adenoma. Invasion of the left cavernous sinus is again noted. The pituitary stalk is midline. The skullbase is otherwise unremarkable. Midline sagittal images are otherwise within normal limits. IMPRESSION: 1. Confirmation of acute nonhemorrhagic white matter infarct involving the left corona radiata and posterior centrum semi of ally. 2. Otherwise stable severe periventricular and diffuse subcortical T2 hyperintensities, advanced for age. This represents the sequela of chronic microvascular ischemia. 3. 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 Electronically Signed   By: San Morelle M.D.   On: 04/08/2016 20:06    EKG: Independently reviewed. 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  - Admit to a telemetry bed - Check lipid panel in a.m. - Carotid Doppler ultrasound per request  of Pt's neurologist Dr. Jaynee Eagles - PT/OT/speech  - Echo previously completed 01/2016 with EF 55-60% and grade 1 diastolic dysfunction - Continue aspirin - Question need of additional medication such as Plavix. - consider need to consult Cardiology for TEE in a.m. - Appreciate neurology consultion  Expressive aphasia: now resolved - Continue to monitor  Essential hypertension: Stable - Continue valsartan-hydrochlorothiazide  Anemia: Hemoglobin on admission 11.6 which appears improved from previous values. Patient with low MCH.  - Follow repeat CBC in a.m. - Check iron studies in a.m. patient has previous history of gastric bypass   Diabetes mellitus type 2: Glucose noted to be as high as 172 on admission. Last hemoglobin A1c 6.2 in 01/2016. Patient appears to be diet controlled and not currently on any oral medications. - Hypoglycemic protocols - CBGs every before meals with sensitive sliding-scale insulin  S/p right hip revision: Patient notes gait disturbance. - Physical therapy  Hypocalcemia: Acute. Ionized calcium 1.1 on admission - 1 g of calcium gluconate IV - Recheck ionized calcium in a.m. and replace as needed  Thrombocytosis: Mild elevation of platelet count noted of 403. - Continue to monitor  GERD - Continue Protonix   DVT prophylaxis:  Lovenox Code Status: Full Family Communication: No family present at bedside Disposition Plan:  Possible discharge home in 1-2 days Consults called:  Neurology  Admission status:  Telemetry observation  Norval Morton MD [facility] Pager [phone]  If 7PM-7AM, please contact night-coverage [email or web address] Password [facility]  04/08/2016, 8:52 PM

## 2016-04-08 NOTE — Telephone Encounter (Signed)
DR.Sethi you are the work in for this am. Read below message. Thanks

## 2016-04-08 NOTE — ED Provider Notes (Signed)
  Physical Exam  BP 119/70 mmHg  Pulse 99  Temp(Src) 98.4 F (36.9 C) (Oral)  Resp 20  SpO2 99%  Physical Exam  ED Course  Procedures  MDM Patient's MRI is positive for stroke. Discussed with Dr. Posterior from neurology, could be embolic cause. Will admit to internal medicine.      Davonna Belling, MD 04/08/16 2032

## 2016-04-09 ENCOUNTER — Observation Stay (HOSPITAL_COMMUNITY): Payer: Medicare Other

## 2016-04-09 ENCOUNTER — Telehealth: Payer: Self-pay | Admitting: Neurology

## 2016-04-09 ENCOUNTER — Encounter (HOSPITAL_COMMUNITY): Payer: Medicare Other

## 2016-04-09 ENCOUNTER — Encounter (HOSPITAL_COMMUNITY): Payer: Self-pay | Admitting: *Deleted

## 2016-04-09 DIAGNOSIS — E785 Hyperlipidemia, unspecified: Secondary | ICD-10-CM | POA: Diagnosis present

## 2016-04-09 DIAGNOSIS — H3530 Unspecified macular degeneration: Secondary | ICD-10-CM | POA: Diagnosis present

## 2016-04-09 DIAGNOSIS — K219 Gastro-esophageal reflux disease without esophagitis: Secondary | ICD-10-CM | POA: Diagnosis present

## 2016-04-09 DIAGNOSIS — M7989 Other specified soft tissue disorders: Secondary | ICD-10-CM | POA: Diagnosis not present

## 2016-04-09 DIAGNOSIS — Z7982 Long term (current) use of aspirin: Secondary | ICD-10-CM | POA: Diagnosis not present

## 2016-04-09 DIAGNOSIS — R4701 Aphasia: Secondary | ICD-10-CM | POA: Diagnosis not present

## 2016-04-09 DIAGNOSIS — Z9884 Bariatric surgery status: Secondary | ICD-10-CM | POA: Diagnosis not present

## 2016-04-09 DIAGNOSIS — E119 Type 2 diabetes mellitus without complications: Secondary | ICD-10-CM | POA: Diagnosis not present

## 2016-04-09 DIAGNOSIS — Z888 Allergy status to other drugs, medicaments and biological substances status: Secondary | ICD-10-CM | POA: Diagnosis not present

## 2016-04-09 DIAGNOSIS — I6340 Cerebral infarction due to embolism of unspecified cerebral artery: Secondary | ICD-10-CM | POA: Diagnosis present

## 2016-04-09 DIAGNOSIS — I10 Essential (primary) hypertension: Secondary | ICD-10-CM | POA: Diagnosis not present

## 2016-04-09 DIAGNOSIS — Z885 Allergy status to narcotic agent status: Secondary | ICD-10-CM | POA: Diagnosis not present

## 2016-04-09 DIAGNOSIS — F410 Panic disorder [episodic paroxysmal anxiety] without agoraphobia: Secondary | ICD-10-CM | POA: Diagnosis present

## 2016-04-09 DIAGNOSIS — I639 Cerebral infarction, unspecified: Secondary | ICD-10-CM | POA: Diagnosis not present

## 2016-04-09 DIAGNOSIS — R2681 Unsteadiness on feet: Secondary | ICD-10-CM | POA: Diagnosis present

## 2016-04-09 DIAGNOSIS — G4733 Obstructive sleep apnea (adult) (pediatric): Secondary | ICD-10-CM | POA: Diagnosis present

## 2016-04-09 DIAGNOSIS — Z807 Family history of other malignant neoplasms of lymphoid, hematopoietic and related tissues: Secondary | ICD-10-CM | POA: Diagnosis not present

## 2016-04-09 DIAGNOSIS — M545 Low back pain: Secondary | ICD-10-CM | POA: Diagnosis present

## 2016-04-09 DIAGNOSIS — E1142 Type 2 diabetes mellitus with diabetic polyneuropathy: Secondary | ICD-10-CM | POA: Diagnosis present

## 2016-04-09 DIAGNOSIS — T8172XA Complication of vein following a procedure, not elsewhere classified, initial encounter: Secondary | ICD-10-CM | POA: Diagnosis present

## 2016-04-09 DIAGNOSIS — Z801 Family history of malignant neoplasm of trachea, bronchus and lung: Secondary | ICD-10-CM | POA: Diagnosis not present

## 2016-04-09 DIAGNOSIS — Z8249 Family history of ischemic heart disease and other diseases of the circulatory system: Secondary | ICD-10-CM | POA: Diagnosis not present

## 2016-04-09 DIAGNOSIS — I63412 Cerebral infarction due to embolism of left middle cerebral artery: Secondary | ICD-10-CM | POA: Diagnosis not present

## 2016-04-09 DIAGNOSIS — Z880 Allergy status to penicillin: Secondary | ICD-10-CM | POA: Diagnosis not present

## 2016-04-09 DIAGNOSIS — G8929 Other chronic pain: Secondary | ICD-10-CM | POA: Diagnosis present

## 2016-04-09 DIAGNOSIS — I517 Cardiomegaly: Secondary | ICD-10-CM | POA: Diagnosis present

## 2016-04-09 DIAGNOSIS — D649 Anemia, unspecified: Secondary | ICD-10-CM | POA: Diagnosis not present

## 2016-04-09 DIAGNOSIS — Z6841 Body Mass Index (BMI) 40.0 and over, adult: Secondary | ICD-10-CM | POA: Diagnosis not present

## 2016-04-09 DIAGNOSIS — I82412 Acute embolism and thrombosis of left femoral vein: Secondary | ICD-10-CM | POA: Diagnosis not present

## 2016-04-09 LAB — GLUCOSE, CAPILLARY
GLUCOSE-CAPILLARY: 124 mg/dL — AB (ref 65–99)
GLUCOSE-CAPILLARY: 96 mg/dL (ref 65–99)
GLUCOSE-CAPILLARY: 123 mg/dL — AB (ref 65–99)
Glucose-Capillary: 121 mg/dL — ABNORMAL HIGH (ref 65–99)

## 2016-04-09 LAB — CBC WITH DIFFERENTIAL/PLATELET
Basophils Absolute: 0 10*3/uL (ref 0.0–0.1)
Basophils Relative: 0 %
EOS PCT: 3 %
Eosinophils Absolute: 0.4 10*3/uL (ref 0.0–0.7)
HEMATOCRIT: 34.9 % — AB (ref 36.0–46.0)
HEMOGLOBIN: 10.6 g/dL — AB (ref 12.0–15.0)
LYMPHS ABS: 2.2 10*3/uL (ref 0.7–4.0)
LYMPHS PCT: 19 %
MCH: 25.7 pg — AB (ref 26.0–34.0)
MCHC: 30.4 g/dL (ref 30.0–36.0)
MCV: 84.7 fL (ref 78.0–100.0)
Monocytes Absolute: 0.9 10*3/uL (ref 0.1–1.0)
Monocytes Relative: 8 %
NEUTROS PCT: 70 %
Neutro Abs: 8 10*3/uL — ABNORMAL HIGH (ref 1.7–7.7)
Platelets: 347 10*3/uL (ref 150–400)
RBC: 4.12 MIL/uL (ref 3.87–5.11)
RDW: 14.6 % (ref 11.5–15.5)
WBC: 11.4 10*3/uL — AB (ref 4.0–10.5)

## 2016-04-09 LAB — BASIC METABOLIC PANEL
Anion gap: 8 (ref 5–15)
BUN: 13 mg/dL (ref 6–20)
CHLORIDE: 105 mmol/L (ref 101–111)
CO2: 26 mmol/L (ref 22–32)
Calcium: 9 mg/dL (ref 8.9–10.3)
Creatinine, Ser: 0.86 mg/dL (ref 0.44–1.00)
GFR calc Af Amer: >60 mL/min (ref >60)
GFR calc non Af Amer: >60 mL/min (ref >60)
GLUCOSE: 151 mg/dL — AB (ref 65–99)
POTASSIUM: 4.2 mmol/L (ref 3.5–5.1)
Sodium: 139 mmol/L (ref 135–145)

## 2016-04-09 LAB — IRON AND TIBC
Iron: 29 ug/dL (ref 28–170)
Saturation Ratios: 9 % — ABNORMAL LOW (ref 10.4–31.8)
TIBC: 318 ug/dL (ref 250–450)
UIBC: 289 ug/dL

## 2016-04-09 LAB — LIPID PANEL
Cholesterol: 128 mg/dL (ref 0–200)
HDL: 48 mg/dL (ref >40)
LDL Cholesterol: 53 mg/dL (ref 0–99)
Total CHOL/HDL Ratio: 2.7 RATIO
Triglycerides: 133 mg/dL (ref <150)
VLDL: 27 mg/dL (ref 0–40)

## 2016-04-09 MED ORDER — CLOPIDOGREL BISULFATE 75 MG PO TABS
75.0000 mg | ORAL_TABLET | Freq: Every day | ORAL | Status: DC
  Administered 2016-04-09 – 2016-04-10 (×2): 75 mg via ORAL
  Filled 2016-04-09: qty 1

## 2016-04-09 MED ORDER — ASPIRIN EC 325 MG PO TBEC
325.0000 mg | DELAYED_RELEASE_TABLET | Freq: Every day | ORAL | Status: DC
  Administered 2016-04-10: 325 mg via ORAL
  Filled 2016-04-09: qty 1

## 2016-04-09 MED ORDER — SODIUM CHLORIDE 0.9 % IV SOLN
Freq: Once | INTRAVENOUS | Status: AC
  Administered 2016-04-10: 500 mL via INTRAVENOUS

## 2016-04-09 MED ORDER — IOPAMIDOL (ISOVUE-370) INJECTION 76%
INTRAVENOUS | Status: AC
  Administered 2016-04-09: 50 mL
  Filled 2016-04-09: qty 50

## 2016-04-09 NOTE — Progress Notes (Signed)
STROKE TEAM PROGRESS NOTE   HISTORY OF PRESENT ILLNESS (per record) This is a 65 year old right-handed woman who presents to the emergency department at the suggestion of her outpatient neurologist for follow-up of some slurred speech and left finger numbness. History is obtained directly from the patient who is an excellent historian.   The patient reports that she was doing well until yesterday afternoon at approximately 5 PM 04/07/2016 (LKW). She was at home visiting with her aunts. As she was speaking to them, she suddenly developed changes in her speech. She states that everything was garbled and she had difficulty speaking clearly. Based on her description, it sounds as if she had difficulty formulating sentences as well as some possible slurred speech. This was accompanied by some numbness that was isolated to the pinky finger in her left hand. She states that her speech difficulties lasted for about 15 minutes and then completely resolved. She continues to have some very slight numbness in her left pinky but this has improved significantly. She denies any other symptoms such as vision loss, weakness, vertigo, or new balance problems. Of note, however, she did have a recent stroke which left her with residual numbness around the right side of her mouth and the right tongue. In the emergency room, no objective neurologic deficits were noted on examination. MRI scan of the brain was obtained and showed areas of acute ischemia in the left frontal lobe and this is found that her admission for further workup.   The patient was admitted to [facility] from 01/31/16 through 02/01/16. She presented at the time with numbness around the right side of her mouth as well as numbness in the third, fourth, and fifth digits on her right hand. She was not a candidate for thrombolytic therapy at that time due to being outside the window for treatment. Imaging revealed an acute lacunar infarction within the left  thalamus as well as subacute lacunar infarction in the right splenium of the corpus callosum. 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 She was seen in consultation by neurology (Dr. Sarina Ill). Additional workup included a transthoracic echocardiogram that showed moderate left ventricular hypertrophy with ejection fraction 55-60% with no valvular abnormalities. Carotid Dopplers were performed and showed no hemodynamically significant stenosis. She was not taking any antiplatelet medication prior to that admission and was started on aspirin 325 mg daily. She was also placed on atorvastatin 40 mg daily. It was recommended that she have a 30 day outpatient Holter monitor as an outpatient with one-month follow-up with Dr. Jaynee Eagles.   The patient has a prior history of strokes with known intracranial vascular stenosis. She is followed at [facility] by Dr. Jaynee Eagles. She was seen by Dr. Jaynee Eagles on 02/07/16. She continued to do well with regards to her stroke. She was maintained on aspirin and atorvastatin and discussion of risk factor reduction including control of blood pressure, lipids, and blood glucose was held. 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 She was referred to neurosurgery. It appears that neurosurgery did not feel that this was a pituitary mass and recommended that the patient see ENT for what they felt was a sphenoid sinus mass. She underwent removal of the mass  on 02/28/16 with left total ethmoidectomy, left maxillary antrostomy, left frontal sinusotomy, and left sphenoidotomy. Subsequent pathology revealed pituitary macroadenoma. Prior to surgery, she had her aspirin reduced to 81 mg  daily to reduce risk of perioperative bleeding. She had prolonged cardiac monitoring which did not reveal atrial fibrillation and no further cardiac monitoring was recommended.  More recently, on 03/30/16, the patient underwent revision of a right total hip arthroplasty due to persistent pain and popping in that joint. According to the discharge summary, she was instructed to take aspirin 325 mg 2 times daily. However, the patient reports that she was not doing this. She was still taking 81 mg once daily. However, last night with her symptoms, she did increase this to 325 mg.  Patient was not administered IV t-PA secondary to delay in arrival. She was admitted for further evaluation and treatment.   SUBJECTIVE (INTERVAL HISTORY) Her family is at the bedside.  Overall she feels her condition is completely resolved. Therapy worked with her this am without new needs identified. Patient up in room - shared HPI including recent OR.    OBJECTIVE Temp:  [98.2 F (36.8 C)-98.9 F (37.2 C)] 98.9 F (37.2 C) (07/20 0400) Pulse Rate:  [85-100] 98 (07/20 0400) Cardiac Rhythm:  [-] Normal sinus rhythm (07/19 2100) Resp:  [13-25] 18 (07/19 2202) BP: (94-138)/(57-96) 108/57 mmHg (07/20 0400) SpO2:  [97 %-100 %] 99 % (07/20 0400) Weight:  [113.49 kg (250 lb 3.2 oz)] 113.49 kg (250 lb 3.2 oz) (07/19 2202)  CBC:   Recent Labs Lab 04/08/16 1425 04/08/16 1441 04/09/16 0155  WBC 10.5  --  11.4*  NEUTROABS 7.8*  --  8.0*  HGB 11.1* 11.6* 10.6*  HCT 35.8* 34.0* 34.9*  MCV 83.6  --  84.7  PLT 403*  --  289    Basic Metabolic Panel:   Recent Labs Lab 04/08/16 1425 04/08/16 1441 04/09/16 0155  NA 137 138 139  K 4.1 4.3 4.2  CL 106 103 105  CO2 25  --  26  GLUCOSE 160* 164* 151*  BUN _0 CREATININE 0.84 0.90 0.86  CALCIUM 8.7*  --  9.0    Lipid Panel:     Component Value Date/Time   CHOL 128 04/09/2016 0155   TRIG 133 04/09/2016 0155   HDL 48 04/09/2016 0155   CHOLHDL 2.7  04/09/2016 0155   VLDL 27 04/09/2016 0155   LDLCALC 53 04/09/2016 0155   HgbA1c:  Lab Results  Component Value Date   HGBA1C 6.2* 01/31/2016   Urine Drug Screen:     Component Value Date/Time   LABOPIA NONE DETECTED 01/31/2016 1220   COCAINSCRNUR NONE DETECTED 01/31/2016 1220   LABBENZ NONE DETECTED 01/31/2016 1220   AMPHETMU NONE DETECTED 01/31/2016 1220   THCU NONE DETECTED 01/31/2016 1220   LABBARB NONE DETECTED 01/31/2016 1220      IMAGING I have personally reviewed the radiological images below and agree with the radiology interpretations.  Ct Head Wo Contrast 04/08/2016  1. 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 2. New focal low-density in the left posterior frontal periventricular white matter, potentially a subacute small vessel infarct. Extensive chronic small vessel ischemic changes in the periventricular white matter are otherwise stable. No evidence of acute cortical stroke or hemorrhage.   Mr Brain Wo Contrast 04/08/2016  1. Confirmation of acute nonhemorrhagic white matter infarct involving the left corona radiata and posterior centrum semi of  ally. 2. Otherwise stable severe periventricular and diffuse subcortical T2 hyperintensities, advanced for age. This represents the sequela of chronic microvascular ischemia. 3. 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   Ct Angio Head & Neck W Or Wo Contrast 04/09/2016  1. Positive for arterial abnormalities associated with the acute white matter ischemia: Severe stenosis at the origin of a left MCA middle sylvian division M2 branch. Moderate irregularity and stenosis in anterior division left M2/ M3 branches. No left MCA M1 stenosis or occlusion. 2. Otherwise carotid bifurcation and ICA siphon atherosclerosis without hemodynamically significant stenosis. 3. Negative  posterior circulation. 4. Stable CT appearance of the brain since yesterday. No associated hemorrhage or mass effect. 5. Abnormal CT appearance of the sella turcica and left cavernous sinus corresponding to the chronic invasive pituitary adenoma. 6. No acute findings in the neck.   LE venous dopplers pending   TEE pending    PHYSICAL EXAM  Temp:  [98.3 F (36.8 C)-98.9 F (37.2 C)] 98.9 F (37.2 C) (07/20 1000) Pulse Rate:  [80-99] 80 (07/20 1000) Resp:  [13-25] 18 (07/19 2202) BP: (106-138)/(57-96) 112/60 mmHg (07/20 1000) SpO2:  [98 %-100 %] 99 % (07/20 1000) Weight:  [250 lb 3.2 oz (113.49 kg)] 250 lb 3.2 oz (113.49 kg) (07/19 2202)  General - obese, well developed, in no apparent distress.  Ophthalmologic - Sharp disc margins OU.   Cardiovascular - Regular rate and rhythm.  Mental Status -  Level of arousal and orientation to time, place, and person were intact. Language including expression, naming, repetition, comprehension was assessed and found intact. Attention span and concentration were normal. Recent and remote memory were intact. Fund of Knowledge was assessed and was intact.  Cranial Nerves II - XII - II - Visual field intact OU. III, IV, VI - Extraocular movements intact. V - Facial sensation intact bilaterally. VII - Facial movement intact bilaterally. VIII - Hearing & vestibular intact bilaterally. X - Palate elevates symmetrically. XI - Chin turning & shoulder shrug intact bilaterally. XII - Tongue protrusion intact.  Motor Strength - The patient's strength was normal in all extremities and pronator drift was absent.  Bulk was normal and fasciculations were absent.   Motor Tone - Muscle tone was assessed at the neck and appendages and was normal.  Reflexes - The patient's reflexes were 1+ in all extremities and she had no pathological reflexes.  Sensory - Light touch, temperature/pinprick were symmetrical except decreased at left lateral side of 5th  digit.    Coordination - The patient had normal movements in the hands and feet with no ataxia or dysmetria.  Tremor was absent.  Gait and Station - The patient's transfers, posture, gait, station, and turns were observed as normal.   ASSESSMENT/PLAN Ms. [name] [name] is a 65 y.o. female with history of HTN, HLD,TIA/stroke, diet controlled DM type 2, peripheral neuropathy,s/p gastric bypass, OSA presenting with slurred speech and left finger numbness. She did not receive IV t-PA due to delay in arrival.   Stroke:  L corona radiata/posterior centrum semiovale infarct, etiology not clear. She had left thalamic and right splenium punctate infarcts in 01/2016, suspicious for cardioembolic infarcts. Pt does have severe L M2 inferior division stenosis and recent hypotension, but the distribution of M2 inferior does not fit into these infarcts. Will need TEE and loop.   MRI  L corona radiata/posterior centrum semiovale infarct  CTA head and neck severe L  M2 middle sylvian division  LE dopplers pending   TEE and loop to look for embolic source requested for tomorrow. Pt NPO after midnight.   LDL 53  HgbA1c pending, 6.2 in May  Lovenox 40 mg sq daily for VTE prophylaxis Diet Heart Room service appropriate?: Yes; Fluid consistency:: Thin  aspirin 81 mg daily prior to admission, now on aspirin 81 mg daily. Due to intracranial stenosis, will recommend ASA 325 mg and plavix for 3 months and then plavix alone.   Patient counseled to be compliant with her antithrombotic medications  Ongoing aggressive stroke risk factor management  Therapy recommendations:  No therapy needs  Disposition:  Return home  Hypertension  BP variable in the hospital  Has been less than 100  Concern BP may be too low  Hold HCTZ  Monitor BP  BP goal 130-150 due to left M2 severe stenosis  Hyperlipidemia  Home meds:  crestor 20, resumed in hospital  LDL 53, goal < 70  Continue statin at  discharge  Diabetes  HgbA1c pending, 6.2 in May, goal < 7.0  Controlled  SSI  Other Stroke Risk Factors  Advanced age  Former Cigarette smoker  Morbid Obesity, Body mass index is 42.93 kg/(m^2)., recommend weight loss, diet and exercise as appropriate   Hx stroke/TIA  01/2016 - L thalamic and possible R corpus callosum infarct, likely due to SVD. Planned OP monitoring  Hx OSA, no CPAP since bariatric surgery in 2012  Other Active Problems  Anemia  S/p recent R hip revision   Hypocalcemia  GERD  Hospital day # 1  [name], MD PhD Stroke Neurology 04/09/2016 5:28 PM   To contact Stroke Continuity provider, please refer to [email or web address] After hours, contact General Neurology

## 2016-04-09 NOTE — Evaluation (Signed)
Occupational Therapy Evaluation and Discharge Patient Details Name: [name] [name] MRN: [id number] DOB: 08-03-1951 Today's Date: 04/09/2016    History of Present Illness Patient is a 65 y/o female with hx of HTN, DM, right THA, obesity, CVA presents s/p Right THA revision.   Clinical Impression   Pt reports she was managing at a mod I level with ADL and mobility PTA. Pt currently at baseline with functional mobility and ADL. Pt reports all symptoms have resolved. Pt c/o numbness in bil fingers; educated on safety with sensation changes. No further acute OT needs identified; signing off at this time. Please re-consult if needs change. Thank you for this referral.    Follow Up Recommendations  No OT follow up    Equipment Recommendations  None recommended by OT    Recommendations for Other Services       Precautions / Restrictions Precautions Precautions: Posterior Hip Precaution Comments: s/p posterior THA on 03/30/2016 Restrictions Weight Bearing Restrictions: Yes RLE Weight Bearing: Weight bearing as tolerated      Mobility Bed Mobility               General bed mobility comments: Pt OOB in chair upon arrival.  Transfers Overall transfer level: Modified independent               General transfer comment: Mod I with use of RW.    Balance Overall balance assessment: No apparent balance deficits (not formally assessed)                                          ADL Overall ADL's : Modified independent                                       General ADL Comments: Pt able to perform toilet transfer, toilet hygiene, clothing manipulation, grooming, and shower transfer with mod I. Pt reports she feels all symptoms have resolved and she is at baseline. Educated pt on safety for sensation changes in bil hands.     Vision Vision Assessment?: No apparent visual deficits   Perception     Praxis      Pertinent Vitals/Pain  Pain Assessment: No/denies pain     Hand Dominance Right   Extremity/Trunk Assessment Upper Extremity Assessment Upper Extremity Assessment: Overall WFL for tasks assessed (numbness in L 5th digit, R 4,5th digits; present PTA)   Lower Extremity Assessment Lower Extremity Assessment: Defer to PT evaluation   Cervical / Trunk Assessment Cervical / Trunk Assessment: Normal   Communication Communication Communication: No difficulties   Cognition Arousal/Alertness: Awake/alert Behavior During Therapy: WFL for tasks assessed/performed Overall Cognitive Status: Within Functional Limits for tasks assessed                     General Comments       Exercises       Shoulder Instructions      Home Living Family/patient expects to be discharged to:: Private residence Living Arrangements: Alone Available Help at Discharge: Family;Available PRN/intermittently Type of Home: House Home Access: Stairs to enter [employer] of Steps: 1   Home Layout: One level     Bathroom Shower/Tub: Occupational psychologist: Handicapped height Bathroom Accessibility: Yes How Accessible: Accessible via walker Home Equipment: Walker - standard;Shower  seat;Grab bars - tub/shower;Shower seat - built in;Hand held shower head;Adaptive equipment;Toilet riser Adaptive Equipment: Reacher    Lives With: Alone    Prior Functioning/Environment Level of Independence: Independent with assistive device(s)        Comments: RW for all mobility    OT Diagnosis: Generalized weakness   OT Problem List:     OT Treatment/Interventions:      OT Goals(Current goals can be found in the care plan section) Acute Rehab OT Goals Patient Stated Goal: to go home OT Goal Formulation: All assessment and education complete, DC therapy  OT Frequency:     Barriers to D/C:            Co-evaluation              End of Session Equipment Utilized During Treatment: Rolling  walker  Activity Tolerance: Patient tolerated treatment well Patient left: in chair;with call bell/phone within reach;with family/visitor present (with MD)   Time: 9265-9978 OT Time Calculation (min): 14 min Charges:  OT General Charges $OT Visit: 1 Procedure OT Evaluation $OT Eval Low Complexity: 1 Procedure G-Codes: OT G-codes **NOT FOR INPATIENT CLASS** Functional Assessment Tool Used: Clinical judgement Functional Limitation: Self care Self Care Current Status (J7654): At least 1 percent but less than 20 percent impaired, limited or restricted Self Care Goal Status (O6885): At least 1 percent but less than 20 percent impaired, limited or restricted Self Care Discharge Status [phone]): At least 1 percent but less than 20 percent impaired, limited or restricted   Binnie Kand M.S., OTR/L Pager: [phone]  04/09/2016, 10:10 AM

## 2016-04-09 NOTE — Care Management Note (Signed)
Case Management Note  Patient Details  Name: [name] [name] MRN: [id number] Date of Birth: 1951/02/04  Subjective/Objective:                 Patient from home alone, aunt staying with her through this weekend. Patient active with [facility] for [facility] PT. Referral made and resumption order placed. Patient states that she has a walker, and has no needs at home.    Action/Plan:  Will DC to home with [facility] for [facility].  Expected Discharge Date:                  Expected Discharge Plan:  [place]  In-House Referral:  NA  Discharge planning Services  CM Consult  Post Acute Care Choice:  Home Health, Resumption of Svcs/PTA Provider Choice offered to:  Patient  DME Arranged:  N/A DME Agency:  NA  HH Arranged:  PT [place] Agency:  [place]  Status of Service:  Completed, signed off  If discussed at [place] of Stay Meetings, dates discussed:    Additional Comments:  Carles Collet, RN 04/09/2016, 1:16 PM

## 2016-04-09 NOTE — Evaluation (Signed)
Physical Therapy Evaluation Patient Details Name: [name] [name] MRN: [id number] DOB: 1951/08/10 Today's Date: 04/09/2016   History of Present Illness  Patient is a 65 y/o female with hx of HTN, DM, right THA, obesity, CVA, Right THA revision on 03/30/16 presents with slurred speech and numbness left pinky finger. MRI- acute CVA in left corona radiata and posterior centrum semi of ally.  Clinical Impression  Pt was admitted with the above. MRI positive for L CVA. Pt eager to walk today due to recent R THA revision. She is mod I for transfer out of chair and ambulated 2107f supervision with RW. Pt mod I in room but requires supervision to ambulate longer distances. Pt has family at home to help and was under care of home PT for hip. Continue acute follow for mobility and strength training.     Follow Up Recommendations Home health PT;Supervision - Intermittent    Equipment Recommendations  None recommended by PT    Recommendations for Other Services       Precautions / Restrictions Precautions Precautions: Posterior Hip Precaution Comments: able to recall 3/3 hip precautions  Restrictions Weight Bearing Restrictions: Yes RLE Weight Bearing: Weight bearing as tolerated      Mobility  Bed Mobility               General bed mobility comments: Pt OOB in chair upon arrival.  Transfers Overall transfer level: Modified independent Equipment used: Rolling walker (2 wheeled)             General transfer comment: Mod I with use of RW.  Ambulation/Gait Ambulation/Gait assistance: Supervision Ambulation Distance (Feet): 200 Feet Assistive device: Rolling walker (2 wheeled) Gait Pattern/deviations: Step-through pattern;Decreased stride length   Gait velocity interpretation: Below normal speed for age/gender General Gait Details: Pt aware of gait asymmetry and is able to adjust for more normal gait pattern due to THA. HR during ambulation was 118  Stairs             Wheelchair Mobility    Modified Rankin (Stroke Patients Only)       Balance Overall balance assessment: No apparent balance deficits (not formally assessed) Sitting-balance support: Feet supported;No upper extremity supported Sitting balance-Leahy Scale: Good     Standing balance support: During functional activity;Bilateral upper extremity supported Standing balance-Leahy Scale: Good                               Pertinent Vitals/Pain Pain Assessment: No/denies pain    Home Living Family/patient expects to be discharged to:: Private residence Living Arrangements: Alone Available Help at Discharge: Family;Available PRN/intermittently Type of Home: House Home Access: Stairs to enter   Entrance Stairs-Number of Steps: 1 Home Layout: One level Home Equipment: Walker - standard;Shower seat;Grab bars - tub/shower;Shower seat - built in;Hand held shower head;Adaptive equipment;Toilet riser      Prior Function Level of Independence: Independent with assistive device(s)         Comments: RW for all mobility     Hand Dominance   Dominant Hand: Right    Extremity/Trunk Assessment   Upper Extremity Assessment: Overall WFL for tasks assessed (numbness in L 5th digit, R 4,5th digits; present PTA)     RUE Sensation: decreased light touch (reported as residual from previous stroke)     Lower Extremity Assessment: Defer to PT evaluation RLE Deficits / Details: Limited ROM/strength secondary to THA  LLE Deficits / Details: [facility]  Cervical / Trunk Assessment: Normal  Communication   Communication: No difficulties  Cognition Arousal/Alertness: Awake/alert Behavior During Therapy: WFL for tasks assessed/performed Overall Cognitive Status: Within Functional Limits for tasks assessed                      General Comments General comments (skin integrity, edema, etc.): Pt reports mild residual numbness in upper lip, but states no other  visual or residual cognitive changes.     Exercises        Assessment/Plan    PT Assessment Patient needs continued PT services  PT Diagnosis Difficulty walking;Abnormality of gait;Generalized weakness   PT Problem List Decreased strength;Decreased range of motion;Decreased activity tolerance;Decreased mobility;Decreased balance;Decreased coordination;Impaired sensation  PT Treatment Interventions Gait training;DME instruction;Stair training;Functional mobility training;Therapeutic activities;Therapeutic exercise;Balance training;Neuromuscular re-education   PT Goals (Current goals can be found in the Care Plan section) Acute Rehab PT Goals Patient Stated Goal: to go home PT Goal Formulation: With patient Time For Goal Achievement: 04/16/16 Potential to Achieve Goals: Good    Frequency Min 3X/week   Barriers to discharge        Co-evaluation               End of Session Equipment Utilized During Treatment: Gait belt Activity Tolerance: Patient tolerated treatment well Patient left: in chair;with call bell/phone within reach;with family/visitor present Nurse Communication: Mobility status    Functional Assessment Tool Used: clinical judgment Functional Limitation: Mobility: Walking and moving around Mobility: Walking and Moving Around Current Status [phone]): At least 1 percent but less than 20 percent impaired, limited or restricted Mobility: Walking and Moving Around Goal Status [phone]): At least 1 percent but less than 20 percent impaired, limited or restricted    Time: 0843-0905 PT Time Calculation (min) (ACUTE ONLY): 22 min   Charges:   PT Evaluation $PT Eval Moderate Complexity: 1 Procedure     PT G Codes:   PT G-Codes **NOT FOR INPATIENT CLASS** Functional Assessment Tool Used: clinical judgment Functional Limitation: Mobility: Walking and moving around Mobility: Walking and Moving Around Current Status ([id number]): At least 1 percent but less than 20  percent impaired, limited or restricted Mobility: Walking and Moving Around Goal Status [phone]): At least 1 percent but less than 20 percent impaired, limited or restricted    [facility] 04/09/2016, 10:45 AM  Tawni Millers, SPT (student physical therapist) [place] [phone]

## 2016-04-09 NOTE — Progress Notes (Signed)
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  - Admit to a telemetry bed - Check lipid panel & ldl at goal - Carotid Doppler ultrasound per request of Pt's neurologist Dr. Jaynee Eagles - PT/OT/speech  - Echo previously completed 01/2016 with EF 55-60% and grade 1 diastolic dysfunction - Continue aspirin - Question need of additional medication such as Plavix. - consider need to consult Cardiology for TEE in a.m., had to be moved to 7/21 - Appreciate neurology consultion  Expressive aphasia: now resolved - Continue to monitor  Essential hypertension: Stable - Continue valsartan-hydrochlorothiazide  Anemia: Hemoglobin on admission 11.6 which appears improved from previous values. Patient with low MCH.  - Follow repeat CBC in a.m. - Check iron studies and low iron values, checking more labs, pt may benefit from iron infusion  Diabetes mellitus type 2: Glucose noted to be as high as 172 on admission. Last hemoglobin A1c 6.2 in 01/2016. Patient appears to be diet controlled and not currently on any oral medications. - Hypoglycemic protocols - CBGs every before meals with sensitive sliding-scale insulin  S/p right hip revision: Patient notes gait disturbance. - Physical therapy  Hypocalcemia: Acute. Ionized calcium 1.1 on admission - 1 g of calcium gluconate IV - nl 7/20  Thrombocytosis: Mild elevation of platelet count noted of 403. - Continue to monitor  GERD - Continue Protonix   DVT prophylaxis: Lovenox Code Status: Full Family Communication: No family present at bedside Disposition Plan: Possible  discharge home in 1-2 days Consults called: Neurology  Admission status: Telemetry observation  Consultants:  neuro  Procedures:  TEE pending  Antibiotics:  n/a(indicate start date, and stop date if known)  HPI/Subjective: Pt doing well. No fevers. No chills. Speech still fluent.  Objective: Filed Vitals:   04/09/16 1000 04/09/16 1813  BP: 112/60 141/64  Pulse: 80 87  Temp: 98.9 F (37.2 C) 97.7 F (36.5 C)  Resp:  19    Intake/Output Summary (Last 24 hours) at 04/09/16 1945 Last data filed at 04/09/16 1700  Gross per 24 hour  Intake   1070 ml  Output      0 ml  Net   1070 ml   Filed Weights   04/08/16 2202  Weight: 113.49 kg (250 lb 3.2 oz)    Exam:   General:  No diaphoresis, anxious, no acute distress  Cardiovascular: Regular rate and rhythm no murmurs rubs or gallops  Respiratory: Clear to auscultation bilaterally no more breathing  Abdomen: Nondistended bowel sounds normal nontender palpation  Musculoskeletal: Moving all extremities, no deformity, 5 out of 5 strength   Neuro: CN II-XII intact, A&Ox3  Data Reviewed: Basic Metabolic Panel:  Recent Labs Lab 04/08/16 1425 04/08/16 1441 04/09/16 0155  NA 137 138 139  K 4.1 4.3 4.2  CL 106 103 105  CO2 25  --  26  GLUCOSE 160* 164* 151*  BUN _0 CREATININE 0.84 0.90 0.86  CALCIUM 8.7*  --  9.0   Liver Function Tests:  Recent Labs Lab 04/08/16 1425  AST 16  ALT 12*  ALKPHOS 115  BILITOT 0.6  PROT 6.1*  ALBUMIN 3.1*  No results for input(s): LIPASE, AMYLASE in the last 168 hours. No results for input(s): AMMONIA in the last 168 hours. CBC:  Recent Labs Lab 04/08/16 1425 04/08/16 1441 04/09/16 0155  WBC 10.5  --  11.4*  NEUTROABS 7.8*  --  8.0*  HGB 11.1* 11.6* 10.6*  HCT 35.8* 34.0* 34.9*  MCV 83.6  --  84.7  PLT 403*  --  347   Cardiac Enzymes: No results for input(s): CKTOTAL, CKMB, CKMBINDEX, TROPONINI in the last 168 hours. BNP (last 3 results) No  results for input(s): BNP in the last 8760 hours.  ProBNP (last 3 results) No results for input(s): PROBNP in the last 8760 hours.  CBG:  Recent Labs Lab 04/08/16 1427 04/09/16 0631 04/09/16 1123 04/09/16 1643  GLUCAP 172* 124* 96 123*    No results found for this or any previous visit (from the past 240 hour(s)).   Studies: Ct Angio Head W Or Wo Contrast  04/09/2016  CLINICAL DATA:  65 year old female with left MCA to acute white matter infarct detected on MRI performed for an episode of aphasia and hand numbness. Subsequent encounter. Personal history of trans sinus surgery for pituitary adenoma. EXAM: CT ANGIOGRAPHY HEAD AND NECK TECHNIQUE: Multidetector CT imaging of the head and neck was performed using the standard protocol during bolus administration of intravenous contrast. Multiplanar CT image reconstructions and MIPs were obtained to evaluate the vascular anatomy. Carotid stenosis measurements (when applicable) are obtained utilizing NASCET criteria, using the distal internal carotid diameter as the denominator. CONTRAST:  50 mL Isovue 370 COMPARISON:  Brain MRI 04/08/2016. Head CT without contrast 04/08/2016, and earlier. FINDINGS: CTA NECK Skeleton: Degenerative changes throughout the cervical spine. No acute osseous abnormality identified. Abnormal appearance of the sella turcica compatible with chronic pituitary adenoma and/or postoperative changes. See also a recent MRI appearance. Aside from the left sphenoid sinus paranasal sinuses and mastoids are well pneumatized. There are also postoperative changes of left maxillary antrostomy. Other neck: Negative lung apices. No superior mediastinal lymphadenopathy. Negative thyroid, larynx, pharynx, parapharyngeal spaces, retropharyngeal space, sublingual space, submandibular glands and parotid glands. No cervical lymphadenopathy. Aortic arch: 3 vessel arch configuration. Minimal arch calcified atherosclerosis. No great vessel origin  stenosis. Right carotid system: Mildly tortuous proximal right CCA. Mild calcified plaque at the right carotid bifurcation, right ICA origin and bulb. No stenosis. Partially retropharyngeal course of the right ICA. Left carotid system: Mildly tortuous proximal left CCA. Mild to moderate soft and calcified plaque at the left carotid bifurcation affecting the left ICA origin and bulb. However, no stenosis results. Mildly tortuous cervical left ICA. Vertebral arteries: No proximal subclavian artery stenosis. Normal vertebral artery origins. Tortuous V1 segments, more so the left. The left vertebral artery is mildly dominant. Tortuous V2 segments. No vertebral artery stenosis in the neck. CTA HEAD Posterior circulation: Dominant distal left vertebral artery with minimal calcified plaque. No distal vertebral stenosis. Normal PICA origins and vertebrobasilar junction. No basilar stenosis. Normal SCA and PCA origins. Posterior communicating arteries are diminutive or absent. Bilateral PCA branches are normal. Anterior circulation: Both ICA siphons are patent. Mild to moderate calcified siphon atherosclerosis worse on the right. No left siphon stenosis. Up to mild right siphon stenosis at the anterior genu. Normal ophthalmic artery origins. Incidental note made of abnormal soft tissue in the cavernous sinus mostly on the left. This is related to the chronic pituitary adenoma, and described by MRI yesterday. Patent carotid termini. Dominant left ACA A1 segment an the right is diminutive.  Normal MCA and ACA origins. Anterior communicating artery and bilateral ACA branches are normal. Right MCA M1 segment, bifurcation, and right MCA branches are within normal limits. Left MCA M1 segment is normal to the bifurcation. The left MCA bifurcation is patent. However, there is a high-grade stenosis of the middle left M2 branch origin best seen on series 405, image 15. There is preserved distal enhancement. There is mild MCA  bifurcation irregularity otherwise. There is also moderate irregularity and stenosis in the anterior division M2/ M3 branches best seen on series 406, image 24. No major left MCA branch occlusion identified. Venous sinuses: Patent. Anatomic variants: Dominant left vertebral artery. Dominant left ACA A1 segment. Delayed phase: Widespread Patchy and confluent abnormal cerebral white matter hypodensity. That on series 501, image 20 at the left centrum semiovale corresponds to the larger area of acute ischemia. No associated hemorrhage or mass effect. Stable gray-white matter differentiation throughout the brain. No ventriculomegaly. No abnormal enhancement identified. IMPRESSION: 1. Positive for arterial abnormalities associated with the acute white matter ischemia: Severe stenosis at the origin of a left MCA middle sylvian division M2 branch. Moderate irregularity and stenosis in anterior division left M2/ M3 branches. No left MCA M1 stenosis or occlusion. 2. Otherwise carotid bifurcation and ICA siphon atherosclerosis without hemodynamically significant stenosis. 3. Negative posterior circulation. 4. Stable CT appearance of the brain since yesterday. No associated hemorrhage or mass effect. 5. Abnormal CT appearance of the sella turcica and left cavernous sinus corresponding to the chronic invasive pituitary adenoma. 6. No acute findings in the neck. Electronically Signed   By: Genevie Ann M.D.   On: 04/09/2016 14:42   Ct Head Wo Contrast  04/08/2016  CLINICAL DATA:  Transient a fatal last night. Left hand numbness today. History of hypertension, TIA and diabetes. EXAM: CT HEAD WITHOUT CONTRAST TECHNIQUE: Contiguous axial images were obtained from the base of the skull through the vertex without intravenous contrast. COMPARISON:  Head CT 01/31/2016. MRI brain 01/31/2016 and 02/12/2016. FINDINGS: Brain: There is no evidence of acute intracranial hemorrhage, mass lesion, brain edema or extra-axial fluid collection. The  ventricles and subarachnoid spaces are appropriately sized for age. There are extensive chronic small vessel ischemic changes within the periventricular white matter. There is a new component in the posterior left frontal periventricular white matter on axial images 19 and 20 which may be subacute. No evidence of acute cortical based infarct. There has been interval transsphenoidal surgery with partial resection of the intra sellar and sphenoid sinus mass. Vascular: Intracranial vascular calcifications are noted. Skull: Negative for fracture or focal lesion. Sinuses/Orbits: No acute sinus findings are demonstrated. 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 Other: None. IMPRESSION: 1. 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 2. New focal low-density in the left posterior frontal periventricular white matter, potentially a subacute small vessel infarct. Extensive chronic small vessel ischemic changes in the periventricular white matter are otherwise stable. No evidence of acute cortical stroke or hemorrhage. Electronically Signed   By: Richardean Sale M.D.   On: 04/08/2016 15:46   Ct Angio Neck W Or Wo Contrast  04/09/2016  CLINICAL DATA:  65 year old female with left MCA to acute white matter infarct detected on MRI performed for an episode of aphasia and hand numbness. Subsequent encounter. Personal history of trans sinus surgery for pituitary adenoma. EXAM: CT ANGIOGRAPHY HEAD AND NECK TECHNIQUE: Multidetector  CT imaging of the head and neck was performed using the standard protocol during bolus administration of intravenous contrast. Multiplanar CT image reconstructions and MIPs were obtained to evaluate the vascular anatomy. Carotid stenosis measurements (when applicable) are obtained utilizing NASCET criteria, using the distal internal carotid  diameter as the denominator. CONTRAST:  50 mL Isovue 370 COMPARISON:  Brain MRI 04/08/2016. Head CT without contrast 04/08/2016, and earlier. FINDINGS: CTA NECK Skeleton: Degenerative changes throughout the cervical spine. No acute osseous abnormality identified. Abnormal appearance of the sella turcica compatible with chronic pituitary adenoma and/or postoperative changes. See also a recent MRI appearance. Aside from the left sphenoid sinus paranasal sinuses and mastoids are well pneumatized. There are also postoperative changes of left maxillary antrostomy. Other neck: Negative lung apices. No superior mediastinal lymphadenopathy. Negative thyroid, larynx, pharynx, parapharyngeal spaces, retropharyngeal space, sublingual space, submandibular glands and parotid glands. No cervical lymphadenopathy. Aortic arch: 3 vessel arch configuration. Minimal arch calcified atherosclerosis. No great vessel origin stenosis. Right carotid system: Mildly tortuous proximal right CCA. Mild calcified plaque at the right carotid bifurcation, right ICA origin and bulb. No stenosis. Partially retropharyngeal course of the right ICA. Left carotid system: Mildly tortuous proximal left CCA. Mild to moderate soft and calcified plaque at the left carotid bifurcation affecting the left ICA origin and bulb. However, no stenosis results. Mildly tortuous cervical left ICA. Vertebral arteries: No proximal subclavian artery stenosis. Normal vertebral artery origins. Tortuous V1 segments, more so the left. The left vertebral artery is mildly dominant. Tortuous V2 segments. No vertebral artery stenosis in the neck. CTA HEAD Posterior circulation: Dominant distal left vertebral artery with minimal calcified plaque. No distal vertebral stenosis. Normal PICA origins and vertebrobasilar junction. No basilar stenosis. Normal SCA and PCA origins. Posterior communicating arteries are diminutive or absent. Bilateral PCA branches are normal. Anterior  circulation: Both ICA siphons are patent. Mild to moderate calcified siphon atherosclerosis worse on the right. No left siphon stenosis. Up to mild right siphon stenosis at the anterior genu. Normal ophthalmic artery origins. Incidental note made of abnormal soft tissue in the cavernous sinus mostly on the left. This is related to the chronic pituitary adenoma, and described by MRI yesterday. Patent carotid termini. Dominant left ACA A1 segment an the right is diminutive. Normal MCA and ACA origins. Anterior communicating artery and bilateral ACA branches are normal. Right MCA M1 segment, bifurcation, and right MCA branches are within normal limits. Left MCA M1 segment is normal to the bifurcation. The left MCA bifurcation is patent. However, there is a high-grade stenosis of the middle left M2 branch origin best seen on series 405, image 15. There is preserved distal enhancement. There is mild MCA bifurcation irregularity otherwise. There is also moderate irregularity and stenosis in the anterior division M2/ M3 branches best seen on series 406, image 24. No major left MCA branch occlusion identified. Venous sinuses: Patent. Anatomic variants: Dominant left vertebral artery. Dominant left ACA A1 segment. Delayed phase: Widespread Patchy and confluent abnormal cerebral white matter hypodensity. That on series 501, image 20 at the left centrum semiovale corresponds to the larger area of acute ischemia. No associated hemorrhage or mass effect. Stable gray-white matter differentiation throughout the brain. No ventriculomegaly. No abnormal enhancement identified. IMPRESSION: 1. Positive for arterial abnormalities associated with the acute white matter ischemia: Severe stenosis at the origin of a left MCA middle sylvian division M2 branch. Moderate irregularity and stenosis in anterior division left M2/ M3 branches. No left MCA M1 stenosis or occlusion.  2. Otherwise carotid bifurcation and ICA siphon atherosclerosis  without hemodynamically significant stenosis. 3. Negative posterior circulation. 4. Stable CT appearance of the brain since yesterday. No associated hemorrhage or mass effect. 5. Abnormal CT appearance of the sella turcica and left cavernous sinus corresponding to the chronic invasive pituitary adenoma. 6. No acute findings in the neck. Electronically Signed   By: Genevie Ann M.D.   On: 04/09/2016 14:42   Mr Brain Wo Contrast  04/08/2016  CLINICAL DATA:  15 minutes episode of numbness in left hand a and aphasia last evening. Symptoms then resolve. Some residual numbness in the hand. EXAM: MRI HEAD WITHOUT CONTRAST TECHNIQUE: Multiplanar, multiecho pulse sequences of the brain and surrounding structures were obtained without intravenous contrast. COMPARISON:  CT head without contrast from the same day. MRI brain 02/12/2016 FINDINGS: Two foci of acute nonhemorrhagic infarction are confirmed within the left corona radiata and posterior centrum semi ovale. No acute cortical infarcts are present. The basal ganglia are intact. Severe periventricular and diffuse subcortical T2 hyperintensities are advanced for age. Multiple remote lacunar infarcts are present in the basal ganglia bilaterally. White matter changes extend into the brainstem as well. Soft tissue involving the left ethmoid area, left cavernous sinus, and left sella turcica is noted. There has been interval surgery and biopsy demonstrating a pituitary adenoma. Invasion of the left cavernous sinus is again noted. The pituitary stalk is midline. The skullbase is otherwise unremarkable. Midline sagittal images are otherwise within normal limits. IMPRESSION: 1. Confirmation of acute nonhemorrhagic white matter infarct involving the left corona radiata and posterior centrum semi of ally. 2. Otherwise stable severe periventricular and diffuse subcortical T2 hyperintensities, advanced for age. This represents the sequela of chronic microvascular ischemia. 3.  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 Electronically Signed   By: San Morelle M.D.   On: 04/08/2016 20:06    Scheduled Meds: . sodium chloride   Intravenous Once  . [START ON 04/10/2016] aspirin EC  325 mg Oral Daily  . clopidogrel  75 mg Oral Daily  . enoxaparin (LOVENOX) injection  40 mg Subcutaneous Q24H  . etodolac  300 mg Oral BID   And  . etodolac  200 mg Oral BID  . insulin aspart  0-9 Units Subcutaneous TID WC  . magnesium oxide  400 mg Oral QPM  . multivitamin  1 tablet Oral Daily  . pantoprazole  40 mg Oral Daily  . 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 If 7PM-7AM, please contact night-coverage at [email or web address], password [facility] 04/09/2016, 7:45 PM

## 2016-04-09 NOTE — Telephone Encounter (Signed)
Hi Shannon Patient is ready to be scheduled for Doppler NO PA Needed Patient has medicare. Thanks Hinton Dyer.

## 2016-04-09 NOTE — Telephone Encounter (Signed)
FYI-I called this patient to schedule the Carotid study.  Pt advised she is inpatient at [facility] and under the care of Dr. Erlinda Hong.  The patient advised that Dr. Erlinda Hong wants to cx the Carotid study and has ordered a CT.  She also advised that Dr. Erlinda Hong stated a Carotid was done in early May.

## 2016-04-09 NOTE — Progress Notes (Signed)
    [facility] has been requested to perform a transesophageal echocardiogram on [name] [name] for CVA.  After careful review of history and examination, the risks and benefits of transesophageal echocardiogram have been explained including risks of esophageal damage, perforation (1:10,000 risk), bleeding, pharyngeal hematoma as well as other potential complications associated with conscious sedation including aspiration, arrhythmia, respiratory failure and death. Alternatives to treatment were discussed, questions were answered. Patient is willing to proceed.   Cecilie Kicks,  04/09/2016 4:48 PM

## 2016-04-09 NOTE — Evaluation (Signed)
Speech Language Pathology Evaluation Patient Details Name: [name] [name] MRN: [id number] DOB: 1950-12-08 Today's Date: 04/09/2016 Time: 9150-5697 SLP Time Calculation (min) (ACUTE ONLY): 11 min  Problem List:  Patient Active Problem List   Diagnosis Date Noted  . Expressive aphasia 04/09/2016  . Anemia 04/09/2016  . Hypocalcemia 04/09/2016  . CVA (cerebral infarction) 04/08/2016  . 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 Thalamic infarct, acute ([place]) 02/01/2016  . 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 Stroke ([place]) 01/31/2016  . Stroke-like symptoms 01/31/2016  . Hyperglycemia 01/31/2016  . Diabetes mellitus without complication ([place])   . Hypertension   . Obesity    Past Medical History:  Past Medical History  Diagnosis Date  . Sleep apnea     no CPAP since weight loss after bariatric surgery '12 (04/08/2016)  . Hypertension     takes Valsartan-HCTZ daily  . Hyperlipidemia     takes Crestor daily  . TIA (transient ischemic attack) 01/2016 X 2    Numbness on right mouth/gums/lips; & numbness in 3rd, 4th, & 5th right digits since" (04/08/2016)  . Chronic bronchitis ([place])     "as a child; haven't had it in years" (04/08/2016)  . Peripheral neuropathy ([facility])     not on any meds  . Joint pain   . Peripheral edema   . Chronic lower back pain     spondylosis  . Urinary frequency   . Urinary urgency   . Urinary incontinence   . Nocturia   . Macular degeneration     "just watching"  . Heart murmur     "dx'd by 1 anesthesiologist years and years ago"  . Type 2 diabetes, diet controlled ([place])   . GERD (gastroesophageal reflux disease)     takes Protonix daily "just to protect my stomach; not for reflux" (04/08/2016)  . Daily headache     "short ones for the last month or 2" (04/08/2016)  . Stroke ([place]) 04/07/2016    "little numbness in pinky of left hand" (04/08/2016)  . Arthritis    "knees, hips, possibly back" (04/08/2016)  . Spondylosis    Past Surgical History:  Past Surgical History  Procedure Laterality Date  . 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 Vaginal hysterectomy      "left my ovaries"  . Total hip arthroplasty Right 2008  . Total knee arthroplasty Bilateral 2001  . Sinus endo w/fusion  02/28/2016    Procedure: ENDOSCOPIC SINUS SURGERY WITH NAVIGATION;  Surgeon: Ruby Cola, MD;  Location: [facility];  Service: ENT;;  . Colonoscopy    . Esophagogastroduodenoscopy      in the 70's  . Cataract extraction w/ intraocular lens  implant, bilateral Bilateral ~ 2012  . Total hip revision Right 03/30/2016    Procedure: RIGHT TOTAL HIP REVISION;  Surgeon: Frederik Pear, MD;  Location: [facility];  Service: Orthopedics;  Laterality: Right;  . Tonsillectomy  1957  . 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     Assessment / Plan / Recommendation Clinical Impression  Pt presents with functional cognitive linguistic abilities.  Scoring on MOCA was 100%!!!!  Pt reports resolution of transient expressive aphaia within 10-15 minutes.  She does admit to occasional issues with word finding and articulation since  prior CVA but states this has improved.  No SLP follow up indicated as pt is at baseline level.    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  Overall Cognitive Status: Within Advertising copywriter for tasks assessed Arousal/Alertness: Awake/alert Orientation Level: Oriented X4 Attention: Selective Selective  Attention: Appears intact Memory: Appears intact Awareness: Appears intact Problem Solving: Appears intact    Comprehension  Auditory Comprehension Overall Auditory Comprehension: Appears within functional limits for tasks assessed Yes/No Questions: Not tested Commands: Within Functional Limits Conversation: Complex Visual Recognition/Discrimination Discrimination: Within Function Limits Reading Comprehension Reading Status: Not tested    Expression Expression Primary Mode of Expression: Verbal Verbal Expression Overall Verbal Expression: Appears within functional limits for tasks assessed Initiation: No impairment Repetition: No impairment Naming: No impairment Pragmatics: No impairment Written Expression Dominant Hand: Right Written Expression: Not tested   Oral / Motor  Oral Motor/Sensory Function Overall Oral Motor/Sensory Function: Mild impairment (reduced right facial/labial sensation) Facial Sensation: Reduced right Motor Speech Overall Motor Speech: Appears within functional limits for tasks assessed Respiration: Within functional limits Resonance: Within functional limits Articulation: Within functional limitis Motor Planning: Witnin functional limits   GO          Functional Assessment Tool Used: MOCA Functional Limitations: Spoken language expressive Spoken Language Expression Current Status (V2919): 0 percent impaired, limited or restricted Spoken Language Expression Goal Status (T6606): 0 percent impaired, limited or restricted Spoken Language Expression Discharge Status [phone]): 0 percent impaired, limited or restricted         Claudie Fisherman, [facility] SLP [phone]

## 2016-04-09 NOTE — Care Management Obs Status (Signed)
[place] NOTIFICATION   Patient Details  Name: [name] MRN: [id number] Date of Birth: [birth year]   Medicare Observation Status Notification Given:  Yes    Carles Collet, RN 04/09/2016, 1:15 PM

## 2016-04-09 NOTE — Telephone Encounter (Signed)
The carotid Doppler study was done in May, it was unremarkable, I will cancel the order for the Doppler.

## 2016-04-10 ENCOUNTER — Encounter (HOSPITAL_COMMUNITY): Payer: Medicare Other

## 2016-04-10 ENCOUNTER — Other Ambulatory Visit: Payer: Self-pay | Admitting: Neurology

## 2016-04-10 ENCOUNTER — Encounter (HOSPITAL_COMMUNITY)
Admission: EM | Disposition: A | Discharge status: Home Health | Payer: Self-pay | Source: Home / Self Care | Attending: Family Medicine

## 2016-04-10 ENCOUNTER — Inpatient Hospital Stay (HOSPITAL_COMMUNITY): Payer: Medicare Other

## 2016-04-10 ENCOUNTER — Encounter (HOSPITAL_COMMUNITY): Payer: Self-pay

## 2016-04-10 DIAGNOSIS — I6340 Cerebral infarction due to embolism of unspecified cerebral artery: Secondary | ICD-10-CM | POA: Insufficient documentation

## 2016-04-10 DIAGNOSIS — M7989 Other specified soft tissue disorders: Secondary | ICD-10-CM

## 2016-04-10 DIAGNOSIS — I639 Cerebral infarction, unspecified: Secondary | ICD-10-CM

## 2016-04-10 DIAGNOSIS — I82412 Acute embolism and thrombosis of left femoral vein: Secondary | ICD-10-CM

## 2016-04-10 DIAGNOSIS — I82419 Acute embolism and thrombosis of unspecified femoral vein: Secondary | ICD-10-CM | POA: Clinically undetermined

## 2016-04-10 HISTORY — PX: TEE WITHOUT CARDIOVERSION: SHX5443

## 2016-04-10 HISTORY — PX: EP IMPLANTABLE DEVICE: SHX172B

## 2016-04-10 LAB — COMPREHENSIVE METABOLIC PANEL
ALBUMIN: 2.7 g/dL — AB (ref 3.5–5.0)
ALT: 9 U/L — ABNORMAL LOW (ref 14–54)
ANION GAP: 8 (ref 5–15)
AST: 18 U/L (ref 15–41)
Alkaline Phosphatase: 89 U/L (ref 38–126)
BUN: 13 mg/dL (ref 6–20)
CO2: 27 mmol/L (ref 22–32)
Calcium: 8.6 mg/dL — ABNORMAL LOW (ref 8.9–10.3)
Chloride: 104 mmol/L (ref 101–111)
Creatinine, Ser: 0.93 mg/dL (ref 0.44–1.00)
GFR calc non Af Amer: >60 mL/min (ref >60)
GLUCOSE: 92 mg/dL (ref 65–99)
POTASSIUM: 4.1 mmol/L (ref 3.5–5.1)
SODIUM: 139 mmol/L (ref 135–145)
Total Bilirubin: 0.5 mg/dL (ref 0.3–1.2)
Total Protein: 5.4 g/dL — ABNORMAL LOW (ref 6.5–8.1)

## 2016-04-10 LAB — CBC WITH DIFFERENTIAL/PLATELET
BASOS ABS: 0 10*3/uL (ref 0.0–0.1)
BASOS PCT: 0 %
Eosinophils Absolute: 0.5 10*3/uL (ref 0.0–0.7)
Eosinophils Relative: 5 %
HEMATOCRIT: 30.8 % — AB (ref 36.0–46.0)
HEMOGLOBIN: 9.6 g/dL — AB (ref 12.0–15.0)
LYMPHS PCT: 25 %
Lymphs Abs: 2.4 10*3/uL (ref 0.7–4.0)
MCH: 26.2 pg (ref 26.0–34.0)
MCHC: 31.2 g/dL (ref 30.0–36.0)
MCV: 84.2 fL (ref 78.0–100.0)
MONO ABS: 0.8 10*3/uL (ref 0.1–1.0)
MONOS PCT: 8 %
NEUTROS ABS: 5.8 10*3/uL (ref 1.7–7.7)
NEUTROS PCT: 62 %
Platelets: 297 10*3/uL (ref 150–400)
RBC: 3.66 MIL/uL — ABNORMAL LOW (ref 3.87–5.11)
RDW: 14.7 % (ref 11.5–15.5)
WBC: 9.4 10*3/uL (ref 4.0–10.5)

## 2016-04-10 LAB — GLUCOSE, CAPILLARY
GLUCOSE-CAPILLARY: 249 mg/dL — AB (ref 65–99)
Glucose-Capillary: 104 mg/dL — ABNORMAL HIGH (ref 65–99)
Glucose-Capillary: 237 mg/dL — ABNORMAL HIGH (ref 65–99)

## 2016-04-10 LAB — RETICULOCYTES
RBC.: 3.66 MIL/uL — AB (ref 3.87–5.11)
RETIC COUNT ABSOLUTE: 69.5 10*3/uL (ref 19.0–186.0)
Retic Ct Pct: 1.9 % (ref 0.4–3.1)

## 2016-04-10 LAB — VITAMIN B12: VITAMIN B 12: 843 pg/mL (ref 180–914)

## 2016-04-10 LAB — FERRITIN: FERRITIN: 26 ng/mL (ref 11–307)

## 2016-04-10 LAB — CALCIUM, IONIZED: CALCIUM, IONIZED, SERUM: 5 mg/dL (ref 4.5–5.6)

## 2016-04-10 LAB — HEMOGLOBIN A1C
HEMOGLOBIN A1C: 6 % — AB (ref 4.8–5.6)
MEAN PLASMA GLUCOSE: 126 mg/dL

## 2016-04-10 SURGERY — LOOP RECORDER INSERTION
Anesthesia: LOCAL

## 2016-04-10 SURGERY — ECHOCARDIOGRAM, TRANSESOPHAGEAL
Anesthesia: Moderate Sedation

## 2016-04-10 MED ORDER — LIDOCAINE-EPINEPHRINE 1 %-1:100000 IJ SOLN
INTRAMUSCULAR | Status: AC
  Filled 2016-04-10: qty 1

## 2016-04-10 MED ORDER — MIDAZOLAM HCL 10 MG/2ML IJ SOLN
INTRAMUSCULAR | Status: DC | PRN
  Administered 2016-04-10: 2 mg via INTRAVENOUS
  Administered 2016-04-10 (×2): 1 mg via INTRAVENOUS

## 2016-04-10 MED ORDER — RIVAROXABAN (XARELTO) VTE STARTER PACK (15 & 20 MG): ORAL_TABLET | ORAL | Status: DC

## 2016-04-10 MED ORDER — DIPHENHYDRAMINE HCL 50 MG/ML IJ SOLN
INTRAMUSCULAR | Status: AC
  Filled 2016-04-10: qty 1

## 2016-04-10 MED ORDER — FENTANYL CITRATE (PF) 100 MCG/2ML IJ SOLN
INTRAMUSCULAR | Status: AC
  Filled 2016-04-10: qty 4

## 2016-04-10 MED ORDER — BUTAMBEN-TETRACAINE-BENZOCAINE 2-2-14 % EX AERO
INHALATION_SPRAY | CUTANEOUS | Status: DC | PRN
  Administered 2016-04-10: 2 via TOPICAL

## 2016-04-10 MED ORDER — LIDOCAINE-EPINEPHRINE 1 %-1:100000 IJ SOLN
INTRAMUSCULAR | Status: DC | PRN
  Administered 2016-04-10: 25 mL

## 2016-04-10 MED ORDER — MIDAZOLAM HCL 5 MG/ML IJ SOLN
INTRAMUSCULAR | Status: AC
  Filled 2016-04-10: qty 2

## 2016-04-10 MED ORDER — FENTANYL CITRATE (PF) 100 MCG/2ML IJ SOLN
INTRAMUSCULAR | Status: DC | PRN
  Administered 2016-04-10: 50 ug via INTRAVENOUS
  Administered 2016-04-10: 25 ug via INTRAVENOUS

## 2016-04-10 SURGICAL SUPPLY — 2 items
LOOP REVEAL LINQSYS (Prosthesis & Implant Heart) ×2 IMPLANT
PACK LOOP INSERTION (CUSTOM PROCEDURE TRAY) ×2 IMPLANT

## 2016-04-10 NOTE — Progress Notes (Signed)
  Echocardiogram 2D Echocardiogram and Echocardiogram Transesophageal has been performed.  Jennette Dubin 04/10/2016, 9:01 AM

## 2016-04-10 NOTE — Progress Notes (Signed)
RN discussed discharge instructions with patient including follow up appt x4, medications, understands risks/benefits, dosage, route, indication for xarelto, pharmacy coming to speak with patient prior to discharge, understands she needs to stop taking bp meds, no asa or plavix. Neuro assessment unchanged, NIHHS unchanged. Will continue to monitor until discharge

## 2016-04-10 NOTE — Consult Note (Signed)
ELECTROPHYSIOLOGY CONSULT NOTE  Patient ID: [name] [name] MRN: [id number], DOB/AGE: September 03, 1951   Admit date: 04/08/2016 Date of Consult: 04/10/2016  Primary Physician: Antony Blackbird, MD Primary Cardiologist: none Reason for Consultation: Cryptogenic stroke ; recommendations regarding Implantable Loop Recorder Requesting MD; Dr. Erlinda Hong  History of Present Illness [name] [name] was admitted on 04/08/2016 with stroke.  They first developed symptoms while at home, slurred or difficult  speech and left hand numbness.  PMHx include recent CVA May 2017 and older strokes as well with known vascular stenosis, sphenoid mass removal 02/28/16, , and most recently a hip surgery 03/30/16, HTN, OSA not using CPAP, HLD, obesity, DM.   Imaging demonstrated L corona radiata/posterior centrum semiovale infarct, etiology not clear, neurology noted: She had left thalamic and right splenium punctate infarcts in 01/2016, suspicious for cardioembolic infarcts. Pt does have severe L M2 inferior division stenosis and recent hypotension, but the distribution of M2 inferior does not fit into these infarcts. [name] [name] need TEE and loop.   she has undergone workup for stroke including echocardiogram and carotid dopplers in May with her recent CVA  The patient has been monitored on telemetry which has demonstrated sinus rhythm with no arrhythmias.  Inpatient stroke work-up is to be completed with a TEE.   Echocardiogram 01/31/16 with recent CVA admission demonstrated  Study Conclusions - Left ventricle: The cavity size was normal. Wall thickness was  increased in a pattern of moderate LVH. Systolic function was  normal. The estimated ejection fraction was in the range of 55%  to 60%. Wall motion was normal; there were no regional wall  motion abnormalities. Doppler parameters are consistent with  abnormal left ventricular relaxation (grade 1 diastolic  dysfunction). - Aortic valve: Poorly visualized. Probably trileaflet;  moderately  calcified leaflets. There was no stenosis. - Mitral valve: Mildly calcified annulus. There was no significant  regurgitation. - Right ventricle: The cavity size was normal. Systolic function  was normal. - Pulmonary arteries: No complete TR doppler jet so unable to  estimate PA systolic pressure. - Inferior vena cava: The vessel was normal in size. The  respirophasic diameter changes were in the normal range (>= 50%),  consistent with normal central venous pressure. Impressions: - Normal LV size with moderate LV hypertrophy. EF 55-60%. Normal RV  size and systolic function. No significant valvular  abnormalities.  Lab work is reviewed.  Prior to admission, the patient denies chest pain, shortness of breath, dizziness, rare palpitations, no syncope.  They are recovering from their stroke with plans to home at discharge.  EP has been asked to evaluate for placement of an implantable loop recorder to monitor for atrial fibrillation.     Past Medical History  Diagnosis Date  . Sleep apnea     no CPAP since weight loss after bariatric surgery '12 (04/08/2016)  . Hypertension     takes Valsartan-HCTZ daily  . Hyperlipidemia     takes Crestor daily  . TIA (transient ischemic attack) 01/2016 X 2    Numbness on right mouth/gums/lips; & numbness in 3rd, 4th, & 5th right digits since" (04/08/2016)  . Chronic bronchitis ([place])     "as a child; haven't had it in years" (04/08/2016)  . Peripheral neuropathy ([facility])     not on any meds  . Joint pain   . Peripheral edema   . Chronic lower back pain     spondylosis  . Urinary frequency   . Urinary urgency   . Urinary incontinence   .  Nocturia   . Macular degeneration     "just watching"  . Heart murmur     "dx'd by 1 anesthesiologist years and years ago"  . Type 2 diabetes, diet controlled ([place])   . GERD (gastroesophageal reflux disease)     takes Protonix daily "just to protect my stomach; not for reflux"  (04/08/2016)  . Daily headache     "short ones for the last month or 2" (04/08/2016)  . Stroke ([place]) 04/07/2016    "little numbness in pinky of left hand" (04/08/2016)  . Arthritis     "knees, hips, possibly back" (04/08/2016)  . Spondylosis      Surgical History:  Past Surgical History  Procedure Laterality Date  . 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 Vaginal hysterectomy      "left my ovaries"  . Total hip arthroplasty Right 2008  . Total knee arthroplasty Bilateral 2001  . Sinus endo w/fusion  02/28/2016    Procedure: ENDOSCOPIC SINUS SURGERY WITH NAVIGATION;  Surgeon: Ruby Cola, MD;  Location: [facility];  Service: ENT;;  . Colonoscopy    . Esophagogastroduodenoscopy      in the 70's  . Cataract extraction w/ intraocular lens  implant, bilateral Bilateral ~ 2012  . Total hip revision Right 03/30/2016    Procedure: RIGHT TOTAL HIP REVISION;  Surgeon: Frederik Pear, MD;  Location: [facility];  Service: Orthopedics;  Laterality: Right;  . Tonsillectomy  1957  . Joint replacement       Prescriptions prior to admission  Medication Sig Dispense Refill Last Dose  . aspirin 81 MG tablet Take 81 mg by mouth daily.   04/07/2016 at Unknown time  . beta carotene w/minerals (OCUVITE) tablet Take 1 tablet by mouth daily.   04/08/2016 at Unknown time  . Coenzyme Q10 (CO Q-10) 200 MG CAPS Take 1 capsule by mouth daily.   04/07/2016 at Unknown time  . etodolac (LODINE) 500 MG tablet Take 500 mg by mouth 2 (two) times daily.    04/08/2016 at Unknown time  . magnesium oxide (MAG-OX) 400 MG tablet Take 400 mg by mouth every evening.    04/07/2016 at Unknown time  . pantoprazole (PROTONIX) 40 MG tablet Take 40 mg by mouth daily.   04/08/2016 at Unknown time  . rosuvastatin (CRESTOR) 20 MG tablet Take 1 tablet (20 mg total) by mouth daily. 30 tablet 0 04/08/2016 at Unknown time  . traMADol (ULTRAM) 50 MG tablet Take 50 mg by mouth every 6 (six) hours as needed for moderate pain.   04/07/2016 at Unknown time  .  valsartan-hydrochlorothiazide (DIOVAN-HCT) 160-25 MG tablet Take 1 tablet by mouth every evening.    04/07/2016 at Unknown time  . aspirin EC 325 MG tablet Take 1 tablet (325 mg total) by mouth 2 (two) times daily. (Patient not taking: Reported on 04/08/2016) 30 tablet 0 Not Taking at Unknown time  . methocarbamol (ROBAXIN) 500 MG tablet Take 1 tablet (500 mg total) by mouth 2 (two) times daily with a meal. (Patient not taking: Reported on 04/08/2016) 60 tablet 0 Not Taking at Unknown time  . oxyCODONE-acetaminophen (ROXICET) 5-325 MG tablet Take 1 tablet by mouth every 4 (four) hours as needed. (Patient not taking: Reported on 04/08/2016) 60 tablet 0 Not Taking at Unknown time    Inpatient Medications:  . sodium chloride   Intravenous Once  . aspirin EC  325 mg Oral Daily  . clopidogrel  75 mg Oral Daily  . enoxaparin (LOVENOX) injection  40 mg Subcutaneous Q24H  . etodolac  300 mg Oral BID   And  . etodolac  200 mg Oral BID  . insulin aspart  0-9 Units Subcutaneous TID WC  . magnesium oxide  400 mg Oral QPM  . multivitamin  1 tablet Oral Daily  . pantoprazole  40 mg Oral Daily  . rosuvastatin  20 mg Oral Daily    Allergies:  Allergies  Allergen Reactions  . Atorvastatin Other (See Comments)    Muscle weakness Muscle Aches  . Ambien [Zolpidem] Other (See Comments)    NIGHT TERRORS  . Penicillins Other (See Comments)    Made nose run uncontrollably  . Simvastatin Other (See Comments)    Muscle weakness  . Byetta 10 Mcg Pen [Exenatide] Nausea Only  . Codeine Nausea And Vomiting    Social History   Social History  . Marital Status: Single    Spouse Name: N/A  . Number of Children: 0  . Years of Education: college   Occupational History  . Retired    Social History Main Topics  . Smoking status: Former Smoker -- 1.00 packs/day for 40 years    Types: Cigarettes    Quit date: 09/17/2007  . Smokeless tobacco: Never Used  . Alcohol Use: 0.0 oz/week    0 Standard drinks or  equivalent per week     Comment: 04/08/2016 "5-6 drinks/year'  . Drug Use: Yes    Special: Marijuana     Comment: 04/08/2016 "did some marijuana in college"  . Sexual Activity: No   Other Topics Concern  . Not on file   Social History Narrative   Drinks 3 cups of coffee a day      Family History  Problem Relation Age of Onset  . Prostate cancer Father   . Lymphoma Father   . Congestive Heart Failure Maternal Grandmother   . Lung cancer Maternal Grandfather       Review of Systems: All other systems reviewed and are otherwise negative except as noted above.  Physical Exam: Filed Vitals:   04/09/16 1813 04/09/16 2233 04/10/16 0050 04/10/16 0611  BP: 141/64 117/71 96/52 108/56  Pulse: 87 85 83 83  Temp: 97.7 F (36.5 C) 97.5 F (36.4 C) 98.5 F (36.9 C) 98.3 F (36.8 C)  TempSrc: Oral Oral Oral Oral  Resp: _0 Weight:      SpO2: 100% 99% 100% 100%    GEN- The patient is well appearing, alert and oriented x 3 today.   Head- normocephalic, atraumatic Eyes-  Sclera clear, conjunctiva pink Ears- hearing intact Oropharynx- clear Neck- supple Lungs- Clear to ausculation bilaterally, normal work of breathing Heart- Regular rate and rhythm, no murmurs, rubs or gallops  GI- soft, NT, ND Extremities- no clubbing, cyanosis, or edema MS- no significant deformity or atrophy Skin- no rash or lesion Psych- euthymic mood, full affect   Labs:   Lab Results  Component Value Date   WBC 9.4 04/10/2016   HGB 9.6* 04/10/2016   HCT 30.8* 04/10/2016   MCV 84.2 04/10/2016   PLT 297 04/10/2016    Recent Labs Lab 04/10/16 0455  NA 139  K 4.1  CL 104  CO2 27  BUN 13  CREATININE 0.93  CALCIUM 8.6*  PROT 5.4*  BILITOT 0.5  ALKPHOS 89  ALT 9*  AST 18  GLUCOSE 92   Lab Results  Component Value Date   CKTOTAL 57 03/15/2010   CKMB 1.6 03/15/2010   TROPONINI  0.01        NO INDICATION OF MYOCARDIAL INJURY. 03/15/2010   Lab Results  Component Value Date    CHOL 128 04/09/2016   CHOL 256* 01/31/2016   CHOL  03/15/2010    180        ATP III CLASSIFICATION:  <200     mg/dL   Desirable  200-239  mg/dL   Borderline High  >=240    mg/dL   High          Lab Results  Component Value Date   HDL 48 04/09/2016   HDL 63 01/31/2016   HDL 38* 03/15/2010   Lab Results  Component Value Date   LDLCALC 53 04/09/2016   LDLCALC 153* 01/31/2016   LDLCALC  03/15/2010    UNABLE TO CALCULATE IF TRIGLYCERIDE OVER 400 mg/dL        Total Cholesterol/HDL:CHD Risk Coronary Heart Disease Risk Table                     Men   Women  1/2 Average Risk   3.4   3.3  Average Risk       5.0   4.4  2 X Average Risk   9.6   7.1  3 X Average Risk  23.4   11.0        Use the calculated Patient Ratio above and the CHD Risk Table to determine the patient's CHD Risk.        ATP III CLASSIFICATION (LDL):  <100     mg/dL   Optimal  100-129  mg/dL   Near or Above                    Optimal  130-159  mg/dL   Borderline  160-189  mg/dL   High  >190     mg/dL   Very High   Lab Results  Component Value Date   TRIG 133 04/09/2016   TRIG 198* 01/31/2016   TRIG 429* 03/15/2010   Lab Results  Component Value Date   CHOLHDL 2.7 04/09/2016   CHOLHDL 4.1 01/31/2016   CHOLHDL 4.7 03/15/2010   No results found for: LDLDIRECT  No results found for: DDIMER   Radiology/Studies:  Ct Angio Head W Or Wo Contrast 04/09/2016  CLINICAL DATA:  65 year old female with left MCA to acute white matter infarct detected on MRI performed for an episode of aphasia and hand numbness. Subsequent encounter. Personal history of trans sinus surgery for pituitary adenoma. EXAM: CT ANGIOGRAPHY HEAD AND NECK TECHNIQUE: Multidetector CT imaging of the head and neck was performed using the standard protocol during bolus administration of intravenous contrast. Multiplanar CT image reconstructions and MIPs were obtained to evaluate the vascular anatomy. Carotid stenosis measurements (when applicable)  are obtained utilizing NASCET criteria, using the distal internal carotid diameter as the denominator. CONTRAST:  50 mL Isovue 370 COMPARISON:  Brain MRI 04/08/2016. Head CT without contrast 04/08/2016, and earlier. FINDINGS: CTA NECK Skeleton: Degenerative changes throughout the cervical spine. No acute osseous abnormality identified. Abnormal appearance of the sella turcica compatible with chronic pituitary adenoma and/or postoperative changes. See also a recent MRI appearance. Aside from the left sphenoid sinus paranasal sinuses and mastoids are well pneumatized. There are also postoperative changes of left maxillary antrostomy. Other neck: Negative lung apices. No superior mediastinal lymphadenopathy. Negative thyroid, larynx, pharynx, parapharyngeal spaces, retropharyngeal space, sublingual space, submandibular glands and parotid glands. No cervical lymphadenopathy. Aortic arch: 3 vessel arch  configuration. Minimal arch calcified atherosclerosis. No great vessel origin stenosis. Right carotid system: Mildly tortuous proximal right CCA. Mild calcified plaque at the right carotid bifurcation, right ICA origin and bulb. No stenosis. Partially retropharyngeal course of the right ICA. Left carotid system: Mildly tortuous proximal left CCA. Mild to moderate soft and calcified plaque at the left carotid bifurcation affecting the left ICA origin and bulb. However, no stenosis results. Mildly tortuous cervical left ICA. Vertebral arteries: No proximal subclavian artery stenosis. Normal vertebral artery origins. Tortuous V1 segments, more so the left. The left vertebral artery is mildly dominant. Tortuous V2 segments. No vertebral artery stenosis in the neck. CTA HEAD Posterior circulation: Dominant distal left vertebral artery with minimal calcified plaque. No distal vertebral stenosis. Normal PICA origins and vertebrobasilar junction. No basilar stenosis. Normal SCA and PCA origins. Posterior communicating arteries are  diminutive or absent. Bilateral PCA branches are normal. Anterior circulation: Both ICA siphons are patent. Mild to moderate calcified siphon atherosclerosis worse on the right. No left siphon stenosis. Up to mild right siphon stenosis at the anterior genu. Normal ophthalmic artery origins. Incidental note made of abnormal soft tissue in the cavernous sinus mostly on the left. This is related to the chronic pituitary adenoma, and described by MRI yesterday. Patent carotid termini. Dominant left ACA A1 segment an the right is diminutive. Normal MCA and ACA origins. Anterior communicating artery and bilateral ACA branches are normal. Right MCA M1 segment, bifurcation, and right MCA branches are within normal limits. Left MCA M1 segment is normal to the bifurcation. The left MCA bifurcation is patent. However, there is a high-grade stenosis of the middle left M2 branch origin best seen on series 405, image 15. There is preserved distal enhancement. There is mild MCA bifurcation irregularity otherwise. There is also moderate irregularity and stenosis in the anterior division M2/ M3 branches best seen on series 406, image 24. No major left MCA branch occlusion identified. Venous sinuses: Patent. Anatomic variants: Dominant left vertebral artery. Dominant left ACA A1 segment. Delayed phase: Widespread Patchy and confluent abnormal cerebral white matter hypodensity. That on series 501, image 20 at the left centrum semiovale corresponds to the larger area of acute ischemia. No associated hemorrhage or mass effect. Stable gray-white matter differentiation throughout the brain. No ventriculomegaly. No abnormal enhancement identified. IMPRESSION: 1. Positive for arterial abnormalities associated with the acute white matter ischemia: Severe stenosis at the origin of a left MCA middle sylvian division M2 branch. Moderate irregularity and stenosis in anterior division left M2/ M3 branches. No left MCA M1 stenosis or occlusion. 2.  Otherwise carotid bifurcation and ICA siphon atherosclerosis without hemodynamically significant stenosis. 3. Negative posterior circulation. 4. Stable CT appearance of the brain since yesterday. No associated hemorrhage or mass effect. 5. Abnormal CT appearance of the sella turcica and left cavernous sinus corresponding to the chronic invasive pituitary adenoma. 6. No acute findings in the neck. Electronically Signed   By: Genevie Ann M.D.   On: 04/09/2016 14:42   Ct Head Wo Contrast 04/08/2016  CLINICAL DATA:  Transient a fatal last night. Left hand numbness today. History of hypertension, TIA and diabetes. EXAM: CT HEAD WITHOUT CONTRAST TECHNIQUE: Contiguous axial images were obtained from the base of the skull through the vertex without intravenous contrast. COMPARISON:  Head CT 01/31/2016. MRI brain 01/31/2016 and 02/12/2016. FINDINGS: Brain: There is no evidence of acute intracranial hemorrhage, mass lesion, brain edema or extra-axial fluid collection. The ventricles and subarachnoid spaces are appropriately sized for age.  There are extensive chronic small vessel ischemic changes within the periventricular white matter. There is a new component in the posterior left frontal periventricular white matter on axial images 19 and 20 which may be subacute. No evidence of acute cortical based infarct. There has been interval transsphenoidal surgery with partial resection of the intra sellar and sphenoid sinus mass. Vascular: Intracranial vascular calcifications are noted. Skull: Negative for fracture or focal lesion. Sinuses/Orbits: No acute sinus findings are demonstrated. 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 Other: None. IMPRESSION: 1. 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 2. New focal low-density in the left posterior frontal periventricular white  matter, potentially a subacute small vessel infarct. Extensive chronic small vessel ischemic changes in the periventricular white matter are otherwise stable. No evidence of acute cortical stroke or hemorrhage. Electronically Signed   By: Richardean Sale M.D.   On: 04/08/2016 15:46   Mr Brain Wo Contrast 04/08/2016  CLINICAL DATA:  15 minutes episode of numbness in left hand a and aphasia last evening. Symptoms then resolve. Some residual numbness in the hand. EXAM: MRI HEAD WITHOUT CONTRAST TECHNIQUE: Multiplanar, multiecho pulse sequences of the brain and surrounding structures were obtained without intravenous contrast. COMPARISON:  CT head without contrast from the same day. MRI brain 02/12/2016 FINDINGS: Two foci of acute nonhemorrhagic infarction are confirmed within the left corona radiata and posterior centrum semi ovale. No acute cortical infarcts are present. The basal ganglia are intact. Severe periventricular and diffuse subcortical T2 hyperintensities are advanced for age. Multiple remote lacunar infarcts are present in the basal ganglia bilaterally. White matter changes extend into the brainstem as well. Soft tissue involving the left ethmoid area, left cavernous sinus, and left sella turcica is noted. There has been interval surgery and biopsy demonstrating a pituitary adenoma. Invasion of the left cavernous sinus is again noted. The pituitary stalk is midline. The skullbase is otherwise unremarkable. Midline sagittal images are otherwise within normal limits. IMPRESSION: 1. Confirmation of acute nonhemorrhagic white matter infarct involving the left corona radiata and posterior centrum semi of ally. 2. Otherwise stable severe periventricular and diffuse subcortical T2 hyperintensities, advanced for age. This represents the sequela of chronic microvascular ischemia. 3. 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  Electronically Signed   By: San Morelle M.D.   On: 04/08/2016 20:06      12-lead ECG SR All prior EKG's in EPIC reviewed with no documented atrial fibrillation  Telemetry SR  Assessment and Plan:  1. Cryptogenic stroke The patient presents with cryptogenic stroke.  The patient has a TEE planned for this AM.  I spoke at length with the patient about monitoring for afib she has already worn a 30 day monitor and via neurology notes was negative for any arrhythmia/Afib, risks, benefits, and alteratives to implantable loop recorder were discussed with the patient today.   At this time, the patient is very clear in their decision to proceed with implantable loop recorder.   Wound care was reviewed with the patient (keep incision clean and dry for 3 days).  Wound check Devanshi Califf be scheduled for the patient  Please call with questions.   Baldwin Jamaica, PA-C 04/10/2016  I have seen and examined this patient with Tommye Standard.  Agree with above, note added to reflect my findings.  On  exam, regular rhythm, no murmurs, lungs clear.  Had cryptogenic stroke with word finding difficulties.  Symptoms have resolved.  TEE planned today.  Discussed LINQ monitoring for AF which she agrees to if the TEE is negative.   Discussed risks and benefits of the procedure.  Risks include bleeding and infection.  She understands these risks and has agreed to the procedure.   Anique Beckley M. Osmara Drummonds MD 04/10/2016 7:23 AM

## 2016-04-10 NOTE — Discharge Instructions (Signed)
Stroke Prevention Some medical conditions and behaviors are associated with an increased chance of having a stroke. You may prevent a stroke by making healthy choices and managing medical conditions. HOW CAN I REDUCE MY RISK OF HAVING A STROKE?   Stay physically active. Get at least 30 minutes of activity on most or all days.  Do not smoke. It may also be helpful to avoid exposure to secondhand smoke.  Limit alcohol use. Moderate alcohol use is considered to be:  No more than 2 drinks per day for men.  No more than 1 drink per day for nonpregnant women.  Eat healthy foods. This involves:  Eating 5 or more servings of fruits and vegetables a day.  Making dietary changes that address high blood pressure (hypertension), high cholesterol, diabetes, or obesity.  Manage your cholesterol levels.  Making food choices that are high in fiber and low in saturated fat, trans fat, and cholesterol may control cholesterol levels.  Take any prescribed medicines to control cholesterol as directed by your health care provider.  Manage your diabetes.  Controlling your carbohydrate and sugar intake is recommended to manage diabetes.  Take any prescribed medicines to control diabetes as directed by your health care provider.  Control your hypertension.  Making food choices that are low in salt (sodium), saturated fat, trans fat, and cholesterol is recommended to manage hypertension.  Ask your health care provider if you need treatment to lower your blood pressure. Take any prescribed medicines to control hypertension as directed by your health care provider.  If you are 64-7 years of age, have your blood pressure checked every 3-5 years. If you are 41 years of age or older, have your blood pressure checked every year.  Maintain a healthy weight.  Reducing calorie intake and making food choices that are low in sodium, saturated fat, trans fat, and cholesterol are recommended to manage  weight.  Stop drug abuse.  Avoid taking birth control pills.  Talk to your health care provider about the risks of taking birth control pills if you are over 23 years old, smoke, get migraines, or have ever had a blood clot.  Get evaluated for sleep disorders (sleep apnea).  Talk to your health care provider about getting a sleep evaluation if you snore a lot or have excessive sleepiness.  Take medicines only as directed by your health care provider.  For some people, aspirin or blood thinners (anticoagulants) are helpful in reducing the risk of forming abnormal blood clots that can lead to stroke. If you have the irregular heart rhythm of atrial fibrillation, you should be on a blood thinner unless there is a good reason you cannot take them.  Understand all your medicine instructions.  Make sure that other conditions (such as anemia or atherosclerosis) are addressed. SEEK IMMEDIATE MEDICAL CARE IF:   You have sudden weakness or numbness of the face, arm, or leg, especially on one side of the body.  Your face or eyelid droops to one side.  You have sudden confusion.  You have trouble speaking (aphasia) or understanding.  You have sudden trouble seeing in one or both eyes.  You have sudden trouble walking.  You have dizziness.  You have a loss of balance or coordination.  You have a sudden, severe headache with no known cause.  You have new chest pain or an irregular heartbeat. Any of these symptoms may represent a serious problem that is an emergency. Do not wait to see if the symptoms will  go away. Get medical help at once. Call your local emergency services (911 in [place] Do not drive yourself to the hospital.   This information is not intended to replace advice given to you by your health care provider. Make sure you discuss any questions you have with your health care provider.   Document Released: 10/15/2004 Document Revised: 09/28/2014 Document Reviewed:  03/10/2013 Elsevier Interactive Patient Education 2016 [employer]  Rivaroxaban oral tablets What is this medicine? RIVAROXABAN (ri va ROX a ban) is an anticoagulant (blood thinner). It is used to treat blood clots in the lungs or in the veins. It is also used after knee or hip surgeries to prevent blood clots. It is also used to lower the chance of stroke in people with a medical condition called atrial fibrillation. This medicine may be used for other purposes; ask your health care provider or pharmacist if you have questions. What should I tell my health care provider before I take this medicine? They need to know if you have any of these conditions: -bleeding disorders -bleeding in the brain -blood in your stools (black or tarry stools) or if you have blood in your vomit -history of stomach bleeding -kidney disease -liver disease -low blood counts, like low white cell, platelet, or red cell counts -recent or planned spinal or epidural procedure -take medicines that treat or prevent blood clots -an unusual or allergic reaction to rivaroxaban, other medicines, foods, dyes, or preservatives -pregnant or trying to get pregnant -breast-feeding How should I use this medicine? Take this medicine by mouth with a glass of water. Follow the directions on the prescription label. Take your medicine at regular intervals. Do not take it more often than directed. Do not stop taking except on your doctor's advice. Stopping this medicine may increase your risk of a blood clot. Be sure to refill your prescription before you run out of medicine. If you are taking this medicine after hip or knee replacement surgery, take it with or without food. If you are taking this medicine for atrial fibrillation, take it with your evening meal. If you are taking this medicine to treat blood clots, take it with food at the same time each day. If you are unable to swallow your tablet, you may crush the tablet and mix it  in applesauce. Then, immediately eat the applesauce. You should eat more food right after you eat the applesauce containing the crushed tablet. Talk to your pediatrician regarding the use of this medicine in children. Special care may be needed. Overdosage: If you think you have taken too much of this medicine contact a poison control center or emergency room at once. NOTE: This medicine is only for you. Do not share this medicine with others. What if I miss a dose? If you take your medicine once a day and miss a dose, take the missed dose as soon as you remember. If you take your medicine twice a day and miss a dose, take the missed dose immediately. In this instance, 2 tablets may be taken at the same time. The next day you should take 1 tablet twice a day as directed. What may interact with this medicine? -aspirin and aspirin-like medicines -certain antibiotics like erythromycin, azithromycin, and clarithromycin -certain medicines for fungal infections like ketoconazole and itraconazole -certain medicines for irregular heart beat like amiodarone, quinidine, dronedarone -certain medicines for seizures like carbamazepine, phenytoin -certain medicines that treat or prevent blood clots like warfarin, enoxaparin, and dalteparin -conivaptan -diltiazem -felodipine -indinavir -  lopinavir; ritonavir -NSAIDS, medicines for pain and inflammation, like ibuprofen or naproxen -ranolazine -rifampin -ritonavir -St. John's wort -verapamil This list may not describe all possible interactions. Give your health care provider a list of all the medicines, herbs, non-prescription drugs, or dietary supplements you use. Also tell them if you smoke, drink alcohol, or use illegal drugs. Some items may interact with your medicine. What should I watch for while using this medicine? Visit your doctor or health care professional for regular checks on your progress. Your condition will be monitored carefully while you  are receiving this medicine. Notify your doctor or health care professional and seek emergency treatment if you develop breathing problems; changes in vision; chest pain; severe, sudden headache; pain, swelling, warmth in the leg; trouble speaking; sudden numbness or weakness of the face, arm, or leg. These can be signs that your condition has gotten worse. If you are going to have surgery, tell your doctor or health care professional that you are taking this medicine. Tell your health care professional that you use this medicine before you have a spinal or epidural procedure. Sometimes people who take this medicine have bleeding problems around the spine when they have a spinal or epidural procedure. This bleeding is very rare. If you have a spinal or epidural procedure while on this medicine, call your health care professional immediately if you have back pain, numbness or tingling (especially in your legs and feet), muscle weakness, paralysis, or loss of bladder or bowel control. Avoid sports and activities that might cause injury while you are using this medicine. Severe falls or injuries can cause unseen bleeding. Be careful when using sharp tools or knives. Consider using an Copy. Take special care brushing or flossing your teeth. Report any injuries, bruising, or red spots on the skin to your doctor or health care professional. What side effects may I notice from receiving this medicine? Side effects that you should report to your doctor or health care professional as soon as possible: -allergic reactions like skin rash, itching or hives, swelling of the face, lips, or tongue -back pain -redness, blistering, peeling or loosening of the skin, including inside the mouth -signs and symptoms of bleeding such as bloody or black, tarry stools; red or dark-brown urine; spitting up blood or brown material that looks like coffee grounds; red spots on the skin; unusual bruising or bleeding from the  eye, gums, or nose Side effects that usually do not require medical attention (Report these to your doctor or health care professional if they continue or are bothersome.): -dizziness -muscle pain This list may not describe all possible side effects. Call your doctor for medical advice about side effects. You may report side effects to FDA at 1-800-FDA-1088. Where should I keep my medicine? Keep out of the reach of children. Store at room temperature between 15 and 30 degrees C (59 and 86 degrees F). Throw away any unused medicine after the expiration date. NOTE: This sheet is a summary. It may not cover all possible information. If you have questions about this medicine, talk to your doctor, pharmacist, or health care provider.    2016, Elsevier/Gold Standard. (2014-09-05 12:45:34)   Information on my medicine - XARELTO (rivaroxaban)  This medication education was reviewed with me or my healthcare representative as part of my discharge preparation.  The pharmacist that spoke with me during my hospital stay was:  Jaquita Folds, [place]? Xarelto was prescribed to treat  blood clots that may have been found in the veins of your legs (deep vein thrombosis) or in your lungs (pulmonary embolism) and to reduce the risk of them occurring again.  What do you need to know about Xarelto? The starting dose is one 15 mg tablet taken TWICE daily with food for the FIRST 21 DAYS then on Aug 11 the dose is changed to one 20 mg tablet taken ONCE A DAY with your evening meal.  DO NOT stop taking Xarelto without talking to the health care provider who prescribed the medication.  Refill your prescription for 20 mg tablets before you run out.  After discharge, you should have regular check-up appointments with your healthcare provider that is prescribing your Xarelto.  In the future your dose may need to be changed if your kidney function changes by a significant  amount.  What do you do if you miss a dose? If you are taking Xarelto TWICE DAILY and you miss a dose, take it as soon as you remember. You may take two 15 mg tablets (total 30 mg) at the same time then resume your regularly scheduled 15 mg twice daily the next day.  If you are taking Xarelto ONCE DAILY and you miss a dose, take it as soon as you remember on the same day then continue your regularly scheduled once daily regimen the next day. Do not take two doses of Xarelto at the same time.   Important Safety Information Xarelto is a blood thinner medicine that can cause bleeding. You should call your healthcare provider right away if you experience any of the following: ? Bleeding from an injury or your nose that does not stop. ? Unusual colored urine (red or dark brown) or unusual colored stools (red or black). ? Unusual bruising for unknown reasons. ? A serious fall or if you hit your head (even if there is no bleeding).  Some medicines may interact with Xarelto and might increase your risk of bleeding while on Xarelto. To help avoid this, consult your healthcare provider or pharmacist prior to using any new prescription or non-prescription medications, including herbals, vitamins, non-steroidal anti-inflammatory drugs (NSAIDs) and supplements.  This website has more information on Xarelto: [email or web address]

## 2016-04-10 NOTE — Op Note (Signed)
INDICATIONS: stroke  PROCEDURE:   Informed consent was obtained prior to the procedure. The risks, benefits and alternatives for the procedure were discussed and the patient comprehended these risks.  Risks include, but are not limited to, cough, sore throat, vomiting, nausea, somnolence, esophageal and stomach trauma or perforation, bleeding, low blood pressure, aspiration, pneumonia, infection, trauma to the teeth and death.    After a procedural time-out, the oropharynx was anesthetized with 20% benzocaine spray.   During this procedure the patient was administered a total of Versed 4 mg and Fentanyl 75 mcg to achieve and maintain moderate conscious sedation.  The patient's heart rate, blood pressure, and oxygen saturationweare monitored continuously during the procedure. The period of conscious sedation was 15 minutes, of which I was present face-to-face 100% of this time.  The transesophageal probe was inserted in the esophagus and stomach without difficulty and multiple views were obtained.  The patient was kept under observation until the patient left the procedure room.  The patient left the procedure room in stable condition.   Agitated microbubble saline contrast was administered.  COMPLICATIONS:    There were no immediate complications.  FINDINGS:  Normal TEE. No cardioembolic source.  RECOMMENDATIONS:     Implantable loop recorder  Time Spent Directly with the Patient:  30 minutes   Jermy Couper 04/10/2016, 8:19 AM

## 2016-04-10 NOTE — Procedures (Signed)
[name]  [address] [place] [place] [address].  [phone]   TRANSCRANIAL DOPPLER BUBBLE STUDY  [name] [name]  Date of Birth: [birth year] Medical Record Number: [id number] Indications: stroke with DVT Date of Procedure: 04/10/2016 Clinical History: stroke with DVT Technical Description: Transcranial Doppler Bubble Study was performed at the bedside after taking written informed consent from the patient and explaining risk/benefits. The left middle cerebral artery was insonated using a hand held probe. And IV line had been previously inserted in the right forearm by the RN using aseptic precautions. Agitated saline injection at rest and after valsalva maneuver did not result in high intensity transient signals (HITS).  Impression: negative Transcranial Doppler Bubble Study indicative of no right to left shunt   Results were explained to the patient. Questions were answered.  [name], MD PhD Stroke Neurology 04/10/2016 11:23 PM

## 2016-04-10 NOTE — Discharge Summary (Signed)
Physician Discharge Summary  [name] [name] SVX:[id number] DOB: 07-Dec-1950 DOA: 04/08/2016  PCP: Antony Blackbird, MD  Admit date: 04/08/2016 Discharge date: 04/10/2016  Time spent: 45 minutes  Recommendations for Outpatient Follow-up:  1. F/u Dr. 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 female with medical history significant of HTN, HLD,TIA/stroke, diet controlled DM type 2, peripheral neuropathy,s/p gastric bypass, OSA; who presents with complaints of numbness over the left fifth digit of her hand. Yesterday around 5:15 PM patient reported acute onset of difficulty speaking that lasted 10-15 minutes. She called EMS, however by the time they arrived symptoms had started to self resolve and therefore declined transport. During this time she checked her blood pressure and it was 154/92. Today she awoke and noticed some worsening numbness over her right lower lip and right 3-5 digits of her hand. These were residual symptoms from her previous stroke back in May. She also noted as well as left fifth digit numbness and tingling. The patient also states that she had an episode of anxiety with palpitations that she relates to panic attack for which she took She called her PCP and Dr. Jaynee Eagles of neurologist who urged patient to come in for further evaluation. Patient notes that she just had a stroke back in May of this year. An evaluation at that time it appears that she did not have a carotid Doppler study. Recent  changes include that she was started on valsartan-hydrochlorothiazide and discontinued losartan-amlodipine approximately 3 weeks ago by her primary care provider as her blood pressures not very well controlled. Since being on this medicine she states that her blood pressures have been more well controlled, but she has had some symptoms of feeling fatigued. Patient has checked her blood pressure and denies having any low blood pressure readings. Approximately 9 days ago the patient underwent hip revision surgery. Associated symptoms include fall in her closet in the last month as well as falling into a stool a couple days ago. She reports usually having unsteady gait due to peripheral neuropathy and since her recent surgery she has been walking with the assistance of a rolling walker. Patient denies having any significant focal weakness, changes in vision, dysuria, or chest pain.  Hospital Course:  Stroke:  L corona radiata/posterior centrum semiovale infarct, etiology not clear. She had left thalamic and right splenium punctate infarcts in 01/2016, suspicious for cardioembolic infarcts. Pt does have severe L M2 inferior division stenosis and recent hypotension, but the distribution of M2 inferior does not fit into these infarcts. TEE normal and loop placed. Found to have DVT likely due to recent hip surgery but TCD bubble study no PFO.    MRI  L corona radiata/posterior centrum semiovale infarct.   CTA head and neck severe L M2 middle sylvian division  LE dopplers L femoral vein DVT   TEE negative for source of embolus  Loop recorder placed 7/21  TCD bubble negative for PFO  LDL 53  HgbA1c 6.0  Lovenox 40 mg sq daily for VTE prophylaxis  Diet Heart Room service appropriate?: Yes; Fluid consistency:: Thin  aspirin 81 mg daily prior to admission, now on aspirin 325 mg daily and clopidogrel 75 mg daily. Due to DVT, recommend anticoagulation. ASA and plavix can be stopped.   Patient counseled  to be compliant with her antithrombotic medications  Ongoing aggressive stroke risk factor management  Therapy recommendations:  No therapy needs  Started on Xarelto for DVT  Permissive HTN  Expressive aphasia resolved  Disposition:  Return home  Procedures: Vas [place] Transcranial Doppler w bubbles 04/10/16 Summary: There were no High Intensity Transient Signals (HITS) heard at rest or with valsalva,  Negative TCD Bubble study .There is no apparent patent forament  ovale (PFO).   04/10/16 Bil LE Doppler - L femoral DVT  04/10/16 Loop Recorder Insertion  04/10/16 TEE Impressions:  - No cardiac source of emboli was indentified.  Consultations:  Electrophysio, Neuro  Discharge Exam: Filed Vitals:   04/10/16 0950 04/10/16 1354  BP: 119/66 106/62  Pulse: 92 85  Temp: 97.9 F (36.6 C) 98 F (36.7 C)  Resp: 14 18     General:  No diaphoresis, anxious, no acute distress  Cardiovascular: Regular rate and rhythm no murmurs rubs or gallops  Respiratory: Clear to auscultation bilaterally no more breathing  Abdomen: Nondistended bowel sounds normal nontender palpation  Musculoskeletal: Moving all extremities, no deformity, 5 out of 5 strength    Discharge Instructions   Discharge Instructions    Ambulatory referral to Neurology    Complete by:  As directed   Dr. Erlinda Hong requests followup in 2 months          Discharge Medication List as of 04/10/2016  5:37 PM    START taking these medications   Details  Rivaroxaban 15 & 20 MG TBPK Take as directed on package: Start with one 11m tablet by mouth twice a day with food. On Day 22, switch to one 279mtablet once a day with food., Print      CONTINUE these medications which have NOT CHANGED   Details  beta carotene w/minerals (OCUVITE) tablet Take 1 tablet by mouth daily., Until Discontinued, Historical Med    magnesium oxide (MAG-OX) 400 MG tablet Take 400 mg by mouth every evening. , Until Discontinued, Historical  Med    pantoprazole (PROTONIX) 40 MG tablet Take 40 mg by mouth daily., Until Discontinued, Historical Med    rosuvastatin (CRESTOR) 20 MG tablet Take 1 tablet (20 mg total) by mouth daily., Starting 02/01/2016, Until Discontinued, Normal    traMADol (ULTRAM) 50 MG tablet Take 50 mg by mouth every 6 (six) hours as needed for moderate pain., Until Discontinued, Historical Med      STOP taking these medications     aspirin 81 MG tablet      aspirin EC 325 MG tablet      Coenzyme Q10 (CO Q-10) 200 MG CAPS      etodolac (LODINE) 500 MG tablet      methocarbamol (ROBAXIN) 500 MG tablet      oxyCODONE-acetaminophen (ROXICET) 5-325 MG tablet      valsartan-hydrochlorothiazide (DIOVAN-HCT) 160-25 MG tablet        Allergies  Allergen Reactions  . Atorvastatin Other (See Comments)    Muscle weakness Muscle Aches  . Ambien [Zolpidem] Other (See Comments)    NIGHT TERRORS  . Penicillins Other (See Comments)    Made nose run uncontrollably  . Simvastatin Other (See Comments)  Muscle weakness  . Byetta 10 Mcg Pen [Exenatide] Nausea Only  . Codeine Nausea And Vomiting   Follow-up Information    Follow up with [facility].   Why:  for home health. Resume services you had prior to admission.   Contact information:   [address] [place] [address] [phone]       Follow up with [facility] On 04/20/2016.   Specialty:  Cardiology   Why:  4:00PM, wound check   Contact information:   [address], [place] [phone]      Follow up with Xu,Jindong, MD In 2 months.   Specialty:  Neurology   Why:  Stroke Clinic, Office will call you with appointment date & time   Contact information:   [address] [place] [address] [phone]        The results of significant diagnostics from this hospitalization (including imaging, microbiology, ancillary and  laboratory) are listed below for reference.    Significant Diagnostic Studies: Ct Angio Head W Or Wo Contrast  04/09/2016  CLINICAL DATA:  65 year old female with left MCA to acute white matter infarct detected on MRI performed for an episode of aphasia and hand numbness. Subsequent encounter. Personal history of trans sinus surgery for pituitary adenoma. EXAM: CT ANGIOGRAPHY HEAD AND NECK TECHNIQUE: Multidetector CT imaging of the head and neck was performed using the standard protocol during bolus administration of intravenous contrast. Multiplanar CT image reconstructions and MIPs were obtained to evaluate the vascular anatomy. Carotid stenosis measurements (when applicable) are obtained utilizing NASCET criteria, using the distal internal carotid diameter as the denominator. CONTRAST:  50 mL Isovue 370 COMPARISON:  Brain MRI 04/08/2016. Head CT without contrast 04/08/2016, and earlier. FINDINGS: CTA NECK Skeleton: Degenerative changes throughout the cervical spine. No acute osseous abnormality identified. Abnormal appearance of the sella turcica compatible with chronic pituitary adenoma and/or postoperative changes. See also a recent MRI appearance. Aside from the left sphenoid sinus paranasal sinuses and mastoids are well pneumatized. There are also postoperative changes of left maxillary antrostomy. Other neck: Negative lung apices. No superior mediastinal lymphadenopathy. Negative thyroid, larynx, pharynx, parapharyngeal spaces, retropharyngeal space, sublingual space, submandibular glands and parotid glands. No cervical lymphadenopathy. Aortic arch: 3 vessel arch configuration. Minimal arch calcified atherosclerosis. No great vessel origin stenosis. Right carotid system: Mildly tortuous proximal right CCA. Mild calcified plaque at the right carotid bifurcation, right ICA origin and bulb. No stenosis. Partially retropharyngeal course of the right ICA. Left carotid system: Mildly tortuous proximal left  CCA. Mild to moderate soft and calcified plaque at the left carotid bifurcation affecting the left ICA origin and bulb. However, no stenosis results. Mildly tortuous cervical left ICA. Vertebral arteries: No proximal subclavian artery stenosis. Normal vertebral artery origins. Tortuous V1 segments, more so the left. The left vertebral artery is mildly dominant. Tortuous V2 segments. No vertebral artery stenosis in the neck. CTA HEAD Posterior circulation: Dominant distal left vertebral artery with minimal calcified plaque. No distal vertebral stenosis. Normal PICA origins and vertebrobasilar junction. No basilar stenosis. Normal SCA and PCA origins. Posterior communicating arteries are diminutive or absent. Bilateral PCA branches are normal. Anterior circulation: Both ICA siphons are patent. Mild to moderate calcified siphon atherosclerosis worse on the right. No left siphon stenosis. Up to mild right siphon stenosis at the anterior genu. Normal ophthalmic artery origins. Incidental note made of abnormal soft tissue in the cavernous sinus mostly on  the left. This is related to the chronic pituitary adenoma, and described by MRI yesterday. Patent carotid termini. Dominant left ACA A1 segment an the right is diminutive. Normal MCA and ACA origins. Anterior communicating artery and bilateral ACA branches are normal. Right MCA M1 segment, bifurcation, and right MCA branches are within normal limits. Left MCA M1 segment is normal to the bifurcation. The left MCA bifurcation is patent. However, there is a high-grade stenosis of the middle left M2 branch origin best seen on series 405, image 15. There is preserved distal enhancement. There is mild MCA bifurcation irregularity otherwise. There is also moderate irregularity and stenosis in the anterior division M2/ M3 branches best seen on series 406, image 24. No major left MCA branch occlusion identified. Venous sinuses: Patent. Anatomic variants: Dominant left vertebral  artery. Dominant left ACA A1 segment. Delayed phase: Widespread Patchy and confluent abnormal cerebral white matter hypodensity. That on series 501, image 20 at the left centrum semiovale corresponds to the larger area of acute ischemia. No associated hemorrhage or mass effect. Stable gray-white matter differentiation throughout the brain. No ventriculomegaly. No abnormal enhancement identified. IMPRESSION: 1. Positive for arterial abnormalities associated with the acute white matter ischemia: Severe stenosis at the origin of a left MCA middle sylvian division M2 branch. Moderate irregularity and stenosis in anterior division left M2/ M3 branches. No left MCA M1 stenosis or occlusion. 2. Otherwise carotid bifurcation and ICA siphon atherosclerosis without hemodynamically significant stenosis. 3. Negative posterior circulation. 4. Stable CT appearance of the brain since yesterday. No associated hemorrhage or mass effect. 5. Abnormal CT appearance of the sella turcica and left cavernous sinus corresponding to the chronic invasive pituitary adenoma. 6. No acute findings in the neck. Electronically Signed   By: Genevie Ann M.D.   On: 04/09/2016 14:42   Ct Head Wo Contrast  04/08/2016  CLINICAL DATA:  Transient a fatal last night. Left hand numbness today. History of hypertension, TIA and diabetes. EXAM: CT HEAD WITHOUT CONTRAST TECHNIQUE: Contiguous axial images were obtained from the base of the skull through the vertex without intravenous contrast. COMPARISON:  Head CT 01/31/2016. MRI brain 01/31/2016 and 02/12/2016. FINDINGS: Brain: There is no evidence of acute intracranial hemorrhage, mass lesion, brain edema or extra-axial fluid collection. The ventricles and subarachnoid spaces are appropriately sized for age. There are extensive chronic small vessel ischemic changes within the periventricular white matter. There is a new component in the posterior left frontal periventricular white matter on axial images 19 and  20 which may be subacute. No evidence of acute cortical based infarct. There has been interval transsphenoidal surgery with partial resection of the intra sellar and sphenoid sinus mass. Vascular: Intracranial vascular calcifications are noted. Skull: Negative for fracture or focal lesion. Sinuses/Orbits: No acute sinus findings are demonstrated. 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 Other: None. IMPRESSION: 1. 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 2. New focal low-density in the left posterior frontal periventricular white matter, potentially a subacute small vessel infarct. Extensive chronic small vessel ischemic changes in the periventricular white matter are otherwise stable. No evidence of acute cortical stroke or hemorrhage. Electronically Signed   By: Richardean Sale M.D.   On: 04/08/2016 15:46   Ct Angio Neck W Or Wo Contrast  04/09/2016  CLINICAL DATA:  65 year old female with left MCA to acute white matter infarct detected on MRI  performed for an episode of aphasia and hand numbness. Subsequent encounter. Personal history of trans sinus surgery for pituitary adenoma. EXAM: CT ANGIOGRAPHY HEAD AND NECK TECHNIQUE: Multidetector CT imaging of the head and neck was performed using the standard protocol during bolus administration of intravenous contrast. Multiplanar CT image reconstructions and MIPs were obtained to evaluate the vascular anatomy. Carotid stenosis measurements (when applicable) are obtained utilizing NASCET criteria, using the distal internal carotid diameter as the denominator. CONTRAST:  50 mL Isovue 370 COMPARISON:  Brain MRI 04/08/2016. Head CT without contrast 04/08/2016, and earlier. FINDINGS: CTA NECK Skeleton: Degenerative changes throughout the cervical spine. No acute osseous abnormality identified. Abnormal  appearance of the sella turcica compatible with chronic pituitary adenoma and/or postoperative changes. See also a recent MRI appearance. Aside from the left sphenoid sinus paranasal sinuses and mastoids are well pneumatized. There are also postoperative changes of left maxillary antrostomy. Other neck: Negative lung apices. No superior mediastinal lymphadenopathy. Negative thyroid, larynx, pharynx, parapharyngeal spaces, retropharyngeal space, sublingual space, submandibular glands and parotid glands. No cervical lymphadenopathy. Aortic arch: 3 vessel arch configuration. Minimal arch calcified atherosclerosis. No great vessel origin stenosis. Right carotid system: Mildly tortuous proximal right CCA. Mild calcified plaque at the right carotid bifurcation, right ICA origin and bulb. No stenosis. Partially retropharyngeal course of the right ICA. Left carotid system: Mildly tortuous proximal left CCA. Mild to moderate soft and calcified plaque at the left carotid bifurcation affecting the left ICA origin and bulb. However, no stenosis results. Mildly tortuous cervical left ICA. Vertebral arteries: No proximal subclavian artery stenosis. Normal vertebral artery origins. Tortuous V1 segments, more so the left. The left vertebral artery is mildly dominant. Tortuous V2 segments. No vertebral artery stenosis in the neck. CTA HEAD Posterior circulation: Dominant distal left vertebral artery with minimal calcified plaque. No distal vertebral stenosis. Normal PICA origins and vertebrobasilar junction. No basilar stenosis. Normal SCA and PCA origins. Posterior communicating arteries are diminutive or absent. Bilateral PCA branches are normal. Anterior circulation: Both ICA siphons are patent. Mild to moderate calcified siphon atherosclerosis worse on the right. No left siphon stenosis. Up to mild right siphon stenosis at the anterior genu. Normal ophthalmic artery origins. Incidental note made of abnormal soft tissue in the  cavernous sinus mostly on the left. This is related to the chronic pituitary adenoma, and described by MRI yesterday. Patent carotid termini. Dominant left ACA A1 segment an the right is diminutive. Normal MCA and ACA origins. Anterior communicating artery and bilateral ACA branches are normal. Right MCA M1 segment, bifurcation, and right MCA branches are within normal limits. Left MCA M1 segment is normal to the bifurcation. The left MCA bifurcation is patent. However, there is a high-grade stenosis of the middle left M2 branch origin best seen on series 405, image 15. There is preserved distal enhancement. There is mild MCA bifurcation irregularity otherwise. There is also moderate irregularity and stenosis in the anterior division M2/ M3 branches best seen on series 406, image 24. No major left MCA branch occlusion identified. Venous sinuses: Patent. Anatomic variants: Dominant left vertebral artery. Dominant left ACA A1 segment. Delayed phase: Widespread Patchy and confluent abnormal cerebral white matter hypodensity. That on series 501, image 20 at the left centrum semiovale corresponds to the larger area of acute ischemia. No associated hemorrhage or mass effect. Stable gray-white matter differentiation throughout the brain. No ventriculomegaly. No abnormal enhancement identified. IMPRESSION: 1. Positive for arterial abnormalities associated with the acute white matter ischemia: Severe stenosis at the  origin of a left MCA middle sylvian division M2 branch. Moderate irregularity and stenosis in anterior division left M2/ M3 branches. No left MCA M1 stenosis or occlusion. 2. Otherwise carotid bifurcation and ICA siphon atherosclerosis without hemodynamically significant stenosis. 3. Negative posterior circulation. 4. Stable CT appearance of the brain since yesterday. No associated hemorrhage or mass effect. 5. Abnormal CT appearance of the sella turcica and left cavernous sinus corresponding to the chronic  invasive pituitary adenoma. 6. No acute findings in the neck. Electronically Signed   By: Genevie Ann M.D.   On: 04/09/2016 14:42   Mr Brain Wo Contrast  04/08/2016  CLINICAL DATA:  15 minutes episode of numbness in left hand a and aphasia last evening. Symptoms then resolve. Some residual numbness in the hand. EXAM: MRI HEAD WITHOUT CONTRAST TECHNIQUE: Multiplanar, multiecho pulse sequences of the brain and surrounding structures were obtained without intravenous contrast. COMPARISON:  CT head without contrast from the same day. MRI brain 02/12/2016 FINDINGS: Two foci of acute nonhemorrhagic infarction are confirmed within the left corona radiata and posterior centrum semi ovale. No acute cortical infarcts are present. The basal ganglia are intact. Severe periventricular and diffuse subcortical T2 hyperintensities are advanced for age. Multiple remote lacunar infarcts are present in the basal ganglia bilaterally. White matter changes extend into the brainstem as well. Soft tissue involving the left ethmoid area, left cavernous sinus, and left sella turcica is noted. There has been interval surgery and biopsy demonstrating a pituitary adenoma. Invasion of the left cavernous sinus is again noted. The pituitary stalk is midline. The skullbase is otherwise unremarkable. Midline sagittal images are otherwise within normal limits. IMPRESSION: 1. Confirmation of acute nonhemorrhagic white matter infarct involving the left corona radiata and posterior centrum semi of ally. 2. Otherwise stable severe periventricular and diffuse subcortical T2 hyperintensities, advanced for age. This represents the sequela of chronic microvascular ischemia. 3. 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 Electronically Signed   By: San Morelle M.D.   On: 04/08/2016 20:06   Dg Pelvis Portable  03/30/2016  CLINICAL DATA:  Status post right hip  arthroplasty EXAM: PORTABLE PELVIS 1-2 VIEWS COMPARISON:  Pelvis MRI, 11/18/2015 FINDINGS: The femoral and acetabular prosthetic components of a right total hip arthroplasty appear well seated and well aligned on this single AP view. There is no acute fracture or evidence of an operative complication. IMPRESSION: Well-positioned right hip total arthroplasty. Electronically Signed   By: Lajean Manes M.D.   On: 03/30/2016 13:51   Mm Digital Screening Bilateral  03/20/2016  CLINICAL DATA:  Screening. EXAM: DIGITAL SCREENING BILATERAL MAMMOGRAM WITH CAD COMPARISON:  Previous exam(s). ACR Breast Density Category b: There are scattered areas of fibroglandular density. FINDINGS: There are no findings suspicious for malignancy. Images were processed with CAD. IMPRESSION: No mammographic evidence of malignancy. A result letter of this screening mammogram will be mailed directly to the patient. RECOMMENDATION: Screening mammogram in one year. (Code:SM-B-01Y) BI-RADS CATEGORY  1: Negative. Electronically Signed   By: Abelardo Diesel M.D.   On: 03/20/2016 09:42    Microbiology: No results found for this or any previous visit (from the past 240 hour(s)).   Labs: Basic Metabolic Panel:  Recent Labs Lab 04/08/16 1425 04/08/16 1441 04/09/16 0155 04/10/16 0455  NA 137 138 139 139  K 4.1 4.3 4.2 4.1  CL 106 103 105 104  CO2 25  --  26 27  GLUCOSE 160* 164*  151* 92  BUN _0 CREATININE 0.84 0.90 0.86 0.93  CALCIUM 8.7*  --  9.0 8.6*   Liver Function Tests:  Recent Labs Lab 04/08/16 1425 04/10/16 0455  AST 16 18  ALT 12* 9*  ALKPHOS 115 89  BILITOT 0.6 0.5  PROT 6.1* 5.4*  ALBUMIN 3.1* 2.7*   No results for input(s): LIPASE, AMYLASE in the last 168 hours. No results for input(s): AMMONIA in the last 168 hours. CBC:  Recent Labs Lab 04/08/16 1425 04/08/16 1441 04/09/16 0155 04/10/16 0455  WBC 10.5  --  11.4* 9.4  NEUTROABS 7.8*  --  8.0* 5.8  HGB 11.1* 11.6* 10.6* 9.6*  HCT  35.8* 34.0* 34.9* 30.8*  MCV 83.6  --  84.7 84.2  PLT 403*  --  347 297   Cardiac Enzymes: No results for input(s): CKTOTAL, CKMB, CKMBINDEX, TROPONINI in the last 168 hours. BNP: BNP (last 3 results) No results for input(s): BNP in the last 8760 hours.  ProBNP (last 3 results) No results for input(s): PROBNP in the last 8760 hours.  CBG:  Recent Labs Lab 04/09/16 1123 04/09/16 1643 04/09/16 2132 04/10/16 0641 04/10/16 1103  GLUCAP 96 123* 121* 104* 237*       Signed:  Elwin Mocha MD  FACP  [facility] 04/10/2016, 5:25 PM

## 2016-04-10 NOTE — Care Management (Signed)
1758 30 day free Xarelto card given to patient. Unable to do a benefits check at this time. Pharmacy will have to give pt co pay cost. No further needs from CM at this time. Del[place], [place], RN,BSN [phone]

## 2016-04-10 NOTE — Progress Notes (Signed)
Patient's dressing covering loop recorder incision saturated. Removed, held pressure, redressed with gauze and tegaderm. Will continue to monitor.

## 2016-04-10 NOTE — Progress Notes (Signed)
PT Cancellation Note  Patient Details Name: [name] [name] MRN: [id number] DOB: [birth year]   Cancelled Treatment:    Reason Eval/Treat Not Completed: Patient at procedure or test/unavailable Pt off floor getting loop recorder placed. Will follow up as time allows.   Marguarite Arbour A Chauncey Sciulli 04/10/2016, 7:39 AM Wray Kearns, [place], DPT [phone]

## 2016-04-10 NOTE — Progress Notes (Signed)
*  Preliminary Results* Bilateral lower extremity venous duplex completed. Right lower extremity is negative for deep vein thrombosis. The left lower extremity is positive for deep vein thrombosis involving a small segment of the left mid femoral vein. There is no evidence of Baker's cyst bilaterally.  Preliminary results discussed with Dr. Erlinda Hong.  04/10/2016  Maudry Mayhew, RVT, RDCS, RDMS

## 2016-04-10 NOTE — Progress Notes (Signed)
*  PRELIMINARY RESULTS* Vascular Ultrasound Transcranial Doppler with Bubbles has been completed with Dr. Erlinda Hong. There is no obvious evidence of high intensity transient signals (HITS) at rest or with valsalva, therefore there is no apparent PFO.  04/10/2016 2:08 PM Maudry Mayhew, RVT, RDCS, RDMS

## 2016-04-10 NOTE — Progress Notes (Signed)
STROKE TEAM PROGRESS NOTE   SUBJECTIVE (INTERVAL HISTORY) Patient back from TEE - had loop placed. Newly identified clot in LE by doppler. However, TCD bubble study this afternoon is negative. She will be discharged with Xarelto and follow up with Dr. Jaynee Eagles.    OBJECTIVE Temp:  [97.5 F (36.4 C)-98.5 F (36.9 C)] 97.9 F (36.6 C) (07/21 0950) Pulse Rate:  [80-92] 92 (07/21 0950) Cardiac Rhythm:  [-] Normal sinus rhythm (07/21 1003) Resp:  [12-19] 14 (07/21 0950) BP: (96-148)/(52-83) 119/66 mmHg (07/21 0950) SpO2:  [95 %-100 %] 99 % (07/21 0950)  CBC:   Recent Labs Lab 04/09/16 0155 04/10/16 0455  WBC 11.4* 9.4  NEUTROABS 8.0* 5.8  HGB 10.6* 9.6*  HCT 34.9* 30.8*  MCV 84.7 84.2  PLT 347 814    Basic Metabolic Panel:   Recent Labs Lab 04/09/16 0155 04/10/16 0455  NA 139 139  K 4.2 4.1  CL 105 104  CO2 26 27  GLUCOSE 151* 92  BUN 13 13  CREATININE 0.86 0.93  CALCIUM 9.0 8.6*    Lipid Panel:     Component Value Date/Time   CHOL 128 04/09/2016 0155   TRIG 133 04/09/2016 0155   HDL 48 04/09/2016 0155   CHOLHDL 2.7 04/09/2016 0155   VLDL 27 04/09/2016 0155   LDLCALC 53 04/09/2016 0155   HgbA1c:  Lab Results  Component Value Date   HGBA1C 6.0* 04/09/2016   Urine Drug Screen:     Component Value Date/Time   LABOPIA NONE DETECTED 01/31/2016 1220   COCAINSCRNUR NONE DETECTED 01/31/2016 1220   LABBENZ NONE DETECTED 01/31/2016 1220   AMPHETMU NONE DETECTED 01/31/2016 1220   THCU NONE DETECTED 01/31/2016 1220   LABBARB NONE DETECTED 01/31/2016 1220      IMAGING I have personally reviewed the radiological images below and agree with the radiology interpretations.  Ct Head Wo Contrast 04/08/2016  1. 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 2. New focal low-density in the left posterior frontal periventricular white matter, potentially a subacute small vessel infarct. Extensive  chronic small vessel ischemic changes in the periventricular white matter are otherwise stable. No evidence of acute cortical stroke or hemorrhage.   Mr Brain Wo Contrast 04/08/2016  1. Confirmation of acute nonhemorrhagic white matter infarct involving the left corona radiata and posterior centrum semi of ally. 2. Otherwise stable severe periventricular and diffuse subcortical T2 hyperintensities, advanced for age. This represents the sequela of chronic microvascular ischemia. 3. 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   Ct Angio Head & Neck W Or Wo Contrast 04/09/2016  1. Positive for arterial abnormalities associated with the acute white matter ischemia: Severe stenosis at the origin of a left MCA middle sylvian division M2 branch. Moderate irregularity and stenosis in anterior division left M2/ M3 branches. No left MCA M1 stenosis or occlusion. 2. Otherwise carotid bifurcation and ICA siphon atherosclerosis without hemodynamically significant stenosis. 3. Negative posterior circulation. 4. Stable CT appearance of the brain since yesterday. No associated hemorrhage or mass effect. 5. Abnormal CT appearance of the sella turcica and left cavernous sinus corresponding to the chronic invasive pituitary adenoma. 6. No acute findings in the neck.   LE venous dopplers Right lower extremity is negative for deep vein thrombosis. The left lower extremity is positive for deep vein thrombosis involving a small segment of the left mid femoral vein. There  is no evidence of Baker's cyst bilaterally  TEE Normal TEE. No cardioembolic source.  TCD bubble study - negative for PFO    PHYSICAL EXAM  Temp:  [97.5 F (36.4 C)-98.5 F (36.9 C)] 97.9 F (36.6 C) (07/21 0950) Pulse Rate:  [80-92] 92 (07/21 0950) Resp:  [12-19] 14 (07/21 0950) BP: (96-148)/(52-83) 119/66 mmHg (07/21 0950) SpO2:  [95 %-100 %] 99 % (07/21  0950)  General - obese, well developed, in no apparent distress.  Ophthalmologic - Sharp disc margins OU.   Cardiovascular - Regular rate and rhythm.  Mental Status -  Level of arousal and orientation to time, place, and person were intact. Language including expression, naming, repetition, comprehension was assessed and found intact. Attention span and concentration were normal. Recent and remote memory were intact. Fund of Knowledge was assessed and was intact.  Cranial Nerves II - XII - II - Visual field intact OU. III, IV, VI - Extraocular movements intact. V - Facial sensation intact bilaterally. VII - Facial movement intact bilaterally. VIII - Hearing & vestibular intact bilaterally. X - Palate elevates symmetrically. XI - Chin turning & shoulder shrug intact bilaterally. XII - Tongue protrusion intact.  Motor Strength - The patient's strength was normal in all extremities and pronator drift was absent.  Bulk was normal and fasciculations were absent.   Motor Tone - Muscle tone was assessed at the neck and appendages and was normal.  Reflexes - The patient's reflexes were 1+ in all extremities and she had no pathological reflexes.  Sensory - Light touch, temperature/pinprick were symmetrical except decreased at left lateral side of 5th digit.    Coordination - The patient had normal movements in the hands and feet with no ataxia or dysmetria.  Tremor was absent.  Gait and Station - The patient's transfers, posture, gait, station, and turns were observed as normal.   ASSESSMENT/PLAN [name] [name] is a 65 y.o. female with history of HTN, HLD,TIA/stroke, diet controlled DM type 2, peripheral neuropathy,s/p gastric bypass, OSA presenting with slurred speech and left finger numbness. She did not receive IV t-PA due to delay in arrival.   Stroke:  L corona radiata/posterior centrum semiovale infarct, etiology not clear. She had left thalamic and right splenium punctate  infarcts in 01/2016, suspicious for cardioembolic infarcts. Pt does have severe L M2 inferior division stenosis and recent hypotension, but the distribution of M2 inferior does not fit into these infarcts. TEE normal and loop placed. Found to have DVT likely due to recent hip surgery but TCD bubble study no PFO.    MRI  L corona radiata/posterior centrum semiovale infarct.   CTA head and neck severe L M2 middle sylvian division  LE dopplers L femoral vein DVT   TEE negative for source of embolus  Loop recorder placed 7/21  TCD bubble negative for PFO  LDL 53  HgbA1c 6.0  Lovenox 40 mg sq daily for VTE prophylaxis Diet Heart Room service appropriate?: Yes; Fluid consistency:: Thin  aspirin 81 mg daily prior to admission, now on aspirin 325 mg daily and clopidogrel 75 mg daily. Due to DVT, recommend anticoagulation. ASA and plavix can be stopped.   Patient counseled to be compliant with her antithrombotic medications  Ongoing aggressive stroke risk factor management  Therapy recommendations:  No therapy needs  Disposition:  Return home  Left femoral vein DVT  Likely due to recent hip surgery  Only takes ASA 37m post op, instead of ASA 3220mbid as instructed  Recommend anticoagulation for 6-12 months  ASA and plavix can be stopped now.   Left 5th digit numbness  Does not fit to stroke this time  Consider outpt EMG/NCS to rule out ulnar neuropathy  Follow up with Dr. Jaynee Eagles  Hypertension  BP variable in the hospital  Has been less than 100  Concern BP may be too low  Hold HCTZ  Monitor BP  BP goal 130-150 due to left M2 severe stenosis  Hyperlipidemia  Home meds:  crestor 20, resumed in hospital  LDL 53, goal < 70  Continue statin at discharge  Diabetes  HgbA1c 6.0, goal < 7.0  Controlled  SSI  Other Stroke Risk Factors  Advanced age  Former Cigarette smoker  Morbid Obesity, Body mass index is 42.93 kg/(m^2)., recommend weight loss, diet  and exercise as appropriate   Hx stroke/TIA  01/2016 - L thalamic and possible R corpus callosum infarct  Hx OSA, no CPAP since bariatric surgery in 2012  Other Active Problems  Anemia  S/p recent R hip revision   Hypocalcemia  GERD   Hospital day # 1  Neurology will sign off. Please call with questions. Pt will follow up with Dr. Jaynee Eagles at [facility] in 05/2016 as scheduled. Thanks for the consult.  [name], MD PhD Stroke Neurology 04/10/2016 11:15 PM    To contact Stroke Continuity provider, please refer to [email or web address] After hours, contact General Neurology

## 2016-04-11 ENCOUNTER — Encounter (HOSPITAL_COMMUNITY): Payer: Self-pay | Admitting: Cardiovascular Disease

## 2016-04-13 ENCOUNTER — Emergency Department (HOSPITAL_COMMUNITY): Payer: Medicare Other

## 2016-04-13 ENCOUNTER — Emergency Department (HOSPITAL_COMMUNITY)
Admission: EM | Admit: 2016-04-13 | Discharge: 2016-04-13 | Disposition: A | Discharge status: Home / Self Care | Payer: Medicare Other | Attending: Emergency Medicine | Admitting: Emergency Medicine

## 2016-04-13 ENCOUNTER — Encounter (HOSPITAL_COMMUNITY): Payer: Self-pay

## 2016-04-13 DIAGNOSIS — Z96641 Presence of right artificial hip joint: Secondary | ICD-10-CM | POA: Diagnosis not present

## 2016-04-13 DIAGNOSIS — M7981 Nontraumatic hematoma of soft tissue: Secondary | ICD-10-CM | POA: Insufficient documentation

## 2016-04-13 DIAGNOSIS — Z96653 Presence of artificial knee joint, bilateral: Secondary | ICD-10-CM | POA: Insufficient documentation

## 2016-04-13 DIAGNOSIS — Z471 Aftercare following joint replacement surgery: Secondary | ICD-10-CM | POA: Diagnosis not present

## 2016-04-13 DIAGNOSIS — I10 Essential (primary) hypertension: Secondary | ICD-10-CM | POA: Diagnosis not present

## 2016-04-13 DIAGNOSIS — Z8673 Personal history of transient ischemic attack (TIA), and cerebral infarction without residual deficits: Secondary | ICD-10-CM | POA: Diagnosis not present

## 2016-04-13 DIAGNOSIS — E119 Type 2 diabetes mellitus without complications: Secondary | ICD-10-CM | POA: Insufficient documentation

## 2016-04-13 DIAGNOSIS — S7001XA Contusion of right hip, initial encounter: Secondary | ICD-10-CM

## 2016-04-13 DIAGNOSIS — T8189XA Other complications of procedures, not elsewhere classified, initial encounter: Secondary | ICD-10-CM | POA: Diagnosis not present

## 2016-04-13 DIAGNOSIS — R55 Syncope and collapse: Secondary | ICD-10-CM | POA: Diagnosis not present

## 2016-04-13 DIAGNOSIS — Z7901 Long term (current) use of anticoagulants: Secondary | ICD-10-CM | POA: Insufficient documentation

## 2016-04-13 DIAGNOSIS — R52 Pain, unspecified: Secondary | ICD-10-CM

## 2016-04-13 DIAGNOSIS — Z87891 Personal history of nicotine dependence: Secondary | ICD-10-CM | POA: Diagnosis not present

## 2016-04-13 DIAGNOSIS — T8131XA Disruption of external operation (surgical) wound, not elsewhere classified, initial encounter: Secondary | ICD-10-CM | POA: Diagnosis not present

## 2016-04-13 DIAGNOSIS — M25551 Pain in right hip: Secondary | ICD-10-CM | POA: Insufficient documentation

## 2016-04-13 LAB — I-STAT TROPONIN, ED: TROPONIN I, POC: 0.01 ng/mL (ref 0.00–0.08)

## 2016-04-13 LAB — I-STAT CHEM 8, ED
BUN: 15 mg/dL (ref 6–20)
CALCIUM ION: 1.08 mmol/L — AB (ref 1.12–1.23)
Chloride: 100 mmol/L — ABNORMAL LOW (ref 101–111)
Creatinine, Ser: 0.7 mg/dL (ref 0.44–1.00)
Glucose, Bld: 208 mg/dL — ABNORMAL HIGH (ref 65–99)
HCT: 29 % — ABNORMAL LOW (ref 36.0–46.0)
HEMOGLOBIN: 9.9 g/dL — AB (ref 12.0–15.0)
Potassium: 4.2 mmol/L (ref 3.5–5.1)
SODIUM: 135 mmol/L (ref 135–145)
TCO2: 24 mmol/L (ref 0–100)

## 2016-04-13 LAB — CBC WITH DIFFERENTIAL/PLATELET
Basophils Absolute: 0 10*3/uL (ref 0.0–0.1)
Basophils Relative: 0 %
EOS ABS: 0 10*3/uL (ref 0.0–0.7)
EOS PCT: 0 %
HCT: 30.5 % — ABNORMAL LOW (ref 36.0–46.0)
Hemoglobin: 9.6 g/dL — ABNORMAL LOW (ref 12.0–15.0)
LYMPHS ABS: 0.8 10*3/uL (ref 0.7–4.0)
Lymphocytes Relative: 5 %
MCH: 26.3 pg (ref 26.0–34.0)
MCHC: 31.5 g/dL (ref 30.0–36.0)
MCV: 83.6 fL (ref 78.0–100.0)
MONO ABS: 0.7 10*3/uL (ref 0.1–1.0)
MONOS PCT: 4 %
Neutro Abs: 14.5 10*3/uL — ABNORMAL HIGH (ref 1.7–7.7)
Neutrophils Relative %: 91 %
PLATELETS: 318 10*3/uL (ref 150–400)
RBC: 3.65 MIL/uL — ABNORMAL LOW (ref 3.87–5.11)
RDW: 14.5 % (ref 11.5–15.5)
WBC: 16 10*3/uL — ABNORMAL HIGH (ref 4.0–10.5)

## 2016-04-13 LAB — FOLATE RBC
FOLATE, RBC: 1149 ng/mL (ref >498)
Folate, Hemolysate: 339 ng/mL
HEMATOCRIT: 29.5 % — AB (ref 34.0–46.6)

## 2016-04-13 MED ORDER — HYDROMORPHONE HCL 1 MG/ML IJ SOLN
1.0000 mg | Freq: Once | INTRAMUSCULAR | Status: AC
  Administered 2016-04-13: 1 mg via INTRAVENOUS
  Filled 2016-04-13: qty 1

## 2016-04-13 MED ORDER — HYDROMORPHONE HCL 1 MG/ML IJ SOLN
0.5000 mg | Freq: Once | INTRAMUSCULAR | Status: AC
  Administered 2016-04-13: 0.5 mg via INTRAVENOUS
  Filled 2016-04-13: qty 1

## 2016-04-13 MED ORDER — ONDANSETRON HCL 4 MG/2ML IJ SOLN
4.0000 mg | Freq: Once | INTRAMUSCULAR | Status: AC
  Administered 2016-04-13: 4 mg via INTRAVENOUS
  Filled 2016-04-13: qty 2

## 2016-04-13 NOTE — ED Triage Notes (Signed)
Pt arrives EMS with c/o pain at right hip and near syncope at home. Pt had right hip revision July 10 and stroke occurred July 18 and seen here July 19 for resolved speech issues. Pt currently has loop recorder in place on chest known left leg DVT.

## 2016-04-13 NOTE — ED Provider Notes (Addendum)
[facility] DEPT Provider Note   CSN: [id number] Arrival date & time: 04/13/16  1047  First Provider Contact:  First MD Initiated Contact with Patient 04/13/16 1107        History   Chief Complaint Chief Complaint  Patient presents with  . Near Syncope  . Leg Pain    HPI [name] [name] is a 65 y.o. female.  Patient is a 65 year old female with a history of recent right hip revision and then hospitalization last week for stroke with concern for possible embolic source with a implantable loop recorder and while hospitalized discovered a left femoral vein DVT and placed on Xarelto. Patient started to have right hip and buttocks pain yesterday which worsened today. She states it feels the way it was prior to the surgery. Pain is worse with walking and laying down. She had been ambulating and moving around well since the surgery until now. When she got up to go to the stretcher she started to develop severe pain which caused her to be short of breath and near syncopal. That has passed and she denies any chest pain or shortness of breath currently. She has no pain in her left leg and has not noted any swelling.   The history is provided by the patient.    Past Medical History:  Diagnosis Date  . Arthritis    "knees, hips, possibly back" (04/08/2016)  . Chronic bronchitis ([place])    "as a child; haven't had it in years" (04/08/2016)  . Chronic lower back pain    spondylosis  . Daily headache    "short ones for the last month or 2" (04/08/2016)  . GERD (gastroesophageal reflux disease)    takes Protonix daily "just to protect my stomach; not for reflux" (04/08/2016)  . Heart murmur    "dx'd by 1 anesthesiologist years and years ago"  . Hyperlipidemia    takes Crestor daily  . Hypertension    takes Valsartan-HCTZ daily  . Joint pain   . Macular degeneration    "just watching"  . Nocturia   . Peripheral edema   . Peripheral neuropathy ([facility])    not on any meds  . Sleep apnea      no CPAP since weight loss after bariatric surgery '12 (04/08/2016)  . Spondylosis   . Stroke ([employer]) 04/07/2016   "little numbness in pinky of left hand" (04/08/2016)  . TIA (transient ischemic attack) 01/2016 X 2   Numbness on right mouth/gums/lips; & numbness in 3rd, 4th, & 5th right digits since" (04/08/2016)  . Type 2 diabetes, diet controlled ([place])   . Urinary frequency   . Urinary incontinence   . Urinary urgency     Patient Active Problem List   Diagnosis Date Noted  . DVT of deep femoral vein ([place]) 04/10/2016  . Cerebrovascular accident (CVA) due to embolism of cerebral artery ([place])   . Expressive aphasia 04/09/2016  . Anemia 04/09/2016  . Hypocalcemia 04/09/2016  . CVA (cerebral infarction) 04/08/2016  . 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 Thalamic infarct, acute ([place]) 02/01/2016  . 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 Stroke ([place]) 01/31/2016  . Stroke-like symptoms 01/31/2016  . Hyperglycemia 01/31/2016  . Diabetes mellitus without complication ([place])   . Hypertension   . Obesity     Past Surgical History:  Procedure Laterality Date  . CATARACT EXTRACTION W/ INTRAOCULAR LENS  IMPLANT, BILATERAL Bilateral ~  2012  . COLONOSCOPY    . EP IMPLANTABLE DEVICE N/A 04/10/2016   Procedure: Loop Recorder Insertion;  Surgeon: Will Meredith Leeds, MD;  Location: [facility];  Service: Cardiovascular;  Laterality: N/A;  . ESOPHAGOGASTRODUODENOSCOPY     in the 70's  . JOINT REPLACEMENT    . 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 SINUS ENDO W/FUSION  02/28/2016   Procedure: ENDOSCOPIC SINUS SURGERY WITH NAVIGATION;  Surgeon: Ruby Cola, MD;  Location: [facility];  Service: ENT;;  . TEE WITHOUT CARDIOVERSION N/A 04/10/2016   Procedure: TRANSESOPHAGEAL ECHOCARDIOGRAM (TEE) POSSIBLE LOOP;  Surgeon: Sanda Klein, MD;  Location: [facility];  Service: Cardiovascular;  Laterality: N/A;  .  TONSILLECTOMY  1957  . TOTAL HIP ARTHROPLASTY Right 2008  . TOTAL HIP REVISION Right 03/30/2016   Procedure: RIGHT TOTAL HIP REVISION;  Surgeon: Frederik Pear, MD;  Location: [facility];  Service: Orthopedics;  Laterality: Right;  . TOTAL KNEE ARTHROPLASTY Bilateral 2001  . VAGINAL HYSTERECTOMY     "left my ovaries"    OB History    No data available       Home Medications    Prior to Admission medications   Medication Sig Start Date End Date Taking? Authorizing Provider  beta carotene w/minerals (OCUVITE) tablet Take 1 tablet by mouth daily.    Historical Provider, MD  magnesium oxide (MAG-OX) 400 MG tablet Take 400 mg by mouth every evening.     Historical Provider, MD  pantoprazole (PROTONIX) 40 MG tablet Take 40 mg by mouth daily.    Historical Provider, MD  Rivaroxaban 15 & 20 MG TBPK Take as directed on package: Start with one 28m tablet by mouth twice a day with food. On Day 22, switch to one 279mtablet once a day with food. 04/10/16   PhElwin MochaMD  rosuvastatin (CRESTOR) 20 MG tablet Take 1 tablet (20 mg total) by mouth daily. 02/01/16   PrLavina HammanMD  traMADol (ULTRAM) 50 MG tablet Take 50 mg by mouth every 6 (six) hours as needed for moderate pain.    Historical Provider, MD    Family History Family History  Problem Relation Age of Onset  . Prostate cancer Father   . Lymphoma Father   . Congestive Heart Failure Maternal Grandmother   . Lung cancer Maternal Grandfather     Social History Social History  Substance Use Topics  . Smoking status: Former Smoker    Packs/day: 1.00    Years: 40.00    Types: Cigarettes    Quit date: 09/17/2007  . Smokeless tobacco: Never Used  . Alcohol use 0.0 oz/week     Comment: 04/08/2016 "5-6 drinks/year'     Allergies   Ambien [zolpidem]; Atorvastatin; Penicillins; Simvastatin; Byetta 10 mcg pen [exenatide]; and Codeine   Review of Systems Review of Systems  All other systems reviewed and are negative.    Physical  Exam Updated Vital Signs BP 120/66   Pulse 84   Temp 98.7 F (37.1 C) (Oral)   Resp 16   Ht _0  (1.6 m)   Wt 250 lb (113.4 kg)   SpO2 99%   BMI 44.29 kg/m   Physical Exam  Constitutional: She is oriented to person, place, and time. She appears well-developed and well-nourished. No distress.  HENT:  Head: Normocephalic and atraumatic.  Mouth/Throat: Oropharynx is clear and moist.  Eyes: Conjunctivae and EOM are normal. Pupils are equal, round, and reactive to light.  Neck: Normal range of  motion. Neck supple.  Cardiovascular: Normal rate, regular rhythm and intact distal pulses.   No murmur heard. Pulmonary/Chest: Effort normal and breath sounds normal. No respiratory distress. She has no wheezes. She has no rales.  Abdominal: Soft. She exhibits no distension. There is no tenderness. There is no rebound and no guarding.  Musculoskeletal: Normal range of motion. She exhibits tenderness. She exhibits no edema.  Tenderness with flexion of the right hip. Incision is well appearing.  Sensation is unchanged in the right foot with 2+ DP pulse. Also pain with palpation over the sciatic notch  Neurological: She is alert and oriented to person, place, and time.  Skin: Skin is warm and dry. No rash noted. No erythema.  Psychiatric: She has a normal mood and affect. Her behavior is normal.  Nursing note and vitals reviewed.    ED Treatments / Results  Labs (all labs ordered are listed, but only abnormal results are displayed) Labs Reviewed  CBC WITH DIFFERENTIAL/PLATELET - Abnormal; Notable for the following:       Result Value   WBC 16.0 (*)    RBC 3.65 (*)    Hemoglobin 9.6 (*)    HCT 30.5 (*)    Neutro Abs 14.5 (*)    All other components within normal limits  I-STAT CHEM 8, ED - Abnormal; Notable for the following:    Chloride 100 (*)    Glucose, Bld 208 (*)    Calcium, Ion 1.08 (*)    Hemoglobin 9.9 (*)    HCT 29.0 (*)    All other components within normal limits  I-STAT  TROPOININ, ED    EKG  EKG Interpretation  Date/Time:  Monday April 13 2016 10:55:11 EDT Ventricular Rate:  96 PR Interval:    QRS Duration: 87 QT Interval:  349 QTC Calculation: 441 R Axis:   1 Text Interpretation:  Sinus rhythm Abnormal R-wave progression, early transition No significant change since last tracing Confirmed by [name] Rued  MD, Loree Fee ([id number]) on 04/13/2016 11:21:17 AM       Radiology Dg Chest 2 View  Result Date: 04/13/2016 CLINICAL DATA:  Near syncopal episode at home. EXAM: CHEST  2 VIEW COMPARISON:  01/31/2016 FINDINGS: Cardiomediastinal silhouette is normal. Mediastinal contours appear intact. There is a small hiatal hernia. There is no evidence of focal airspace consolidation, pleural effusion or pneumothorax. Osseous structures are without acute abnormality. Soft tissues are grossly normal. IMPRESSION: No active cardiopulmonary disease. Small hiatal hernia. Electronically Signed   By: Fidela Salisbury M.D.   On: 04/13/2016 12:08  Ct Hip Right Wo Contrast  Result Date: 04/13/2016 CLINICAL DATA:  Right hip arthroplasty revision July 2018. Unable to ambulate today. EXAM: CT OF THE RIGHT HIP WITHOUT CONTRAST TECHNIQUE: Multidetector CT imaging of the right hip was performed according to the standard protocol. Multiplanar CT image reconstructions were also generated. COMPARISON:  None. FINDINGS: Bones/Joint/Cartilage Right total hip arthroplasty. No osteolysis. Periarticular complex hyperdense fluid collection posterior to the hip arthroplasty measuring approximately 9.4 x 11.1 cm most consistent with a hematoma. No fracture or dislocation. Normal alignment. No aggressive lytic or sclerotic osseous lesion. Muscles and Tendons No muscle atrophy. Soft tissue No fluid collection or hematoma. No soft tissue mass. 7.4 x 11.1 x 17 cm complex hyperdense fluid collection in the subcutaneous fat which is contiguous with the periarticular hematoma through a fascial defect (image  68/series 5). There is peripheral vascular atherosclerotic disease. IMPRESSION: 1. Right total hip arthroplasty. Large periarticular hematoma posterior to the hip arthroplasty  measuring approximately 9.4 x 11.1 cm. 7.4 x 11.1 x 17 cm hematoma in the subcutaneous fat which is contiguous with the periarticular hematoma through a fascial defect (image 68/series 5). Electronically Signed   By: Kathreen Devoid   On: 04/13/2016 15:29  Dg Hip Unilat With Pelvis 2-3 Views Right  Result Date: 04/13/2016 CLINICAL DATA:  Right hip pain. Near syncope. Right hip revision earlier this month. EXAM: DG HIP (WITH OR WITHOUT PELVIS) 2-3V RIGHT COMPARISON:  Pelvis radiograph 03/30/2016 FINDINGS: Sequelae of right total hip arthroplasty are again identified. There is no evidence of acute fracture or dislocation. No periprosthetic lucency to indicate loosening or infection. Limited evaluation of the left hip demonstrate moderate to severe osteoarthrosis with joint space narrowing and subchondral sclerotic and cystic change. IMPRESSION: Right total hip arthroplasty without evidence of acute abnormality. Electronically Signed   By: Logan Bores M.D.   On: 04/13/2016 12:11   Procedures Procedures (including critical care time)  Medications Ordered in ED Medications  HYDROmorphone (DILAUDID) injection 1 mg (1 mg Intravenous Given 04/13/16 1117)  ondansetron (ZOFRAN) injection 4 mg (4 mg Intravenous Given 04/13/16 1117)  HYDROmorphone (DILAUDID) injection 0.5 mg (0.5 mg Intravenous Given 04/13/16 1419)     Initial Impression / Assessment and Plan / ED Course  I have reviewed the triage vital signs and the nursing notes.  Pertinent labs & imaging results that were available during my care of the patient were reviewed by me and considered in my medical decision making (see chart for details).  Clinical Course   Patient presenting today for severe right hip pain and near-syncope. Patient has a significant medical history of  recent right hip revision a proximally 2 weeks ago but then after that was admitted for stroke with concern of possible ischemic and embolic elements. She had a loop recorder placed and during hospitalization was found to have a small left proximal femoral DVT that was treated with Xarelto.  She's had 3 doses of blood thinner at this time. Yesterday she started having pain in her left hip which she describes feels like prior to having the surgery. The pain became worse throughout the night and today when she was attempting to move over to the stretcher the pain became so severe she started to feel lightheaded and short of breath. Did not continue it has completely resolved. She denies any chest pain or ongoing shortness of breath  4:54 PM Spoke with Dr. Mayer Camel who requested a CT to evaluate for hematoma. Plain films are negative blood work is unchanged. CT showed a large hematoma just most likely the cause of her pain. However at this time cannot DC anticoagulation due to stroke and DVT. Findings were discussed with the patient.  Bandage was removed. Patient will have follow-up with Dr. Mayer Camel  Final Clinical Impressions(s) / ED Diagnoses   Final diagnoses:  Hip hematoma, right, initial encounter  Near syncope    New Prescriptions New Prescriptions   No medications on file     Blanchie Dessert, MD 04/13/16 [place], MD 04/13/16 1723

## 2016-04-13 NOTE — ED Notes (Signed)
Pt states she understands instructions and home stable with family.

## 2016-04-13 NOTE — ED Notes (Signed)
MD at bedside.

## 2016-04-13 NOTE — ED Notes (Signed)
Patient transported to X-ray

## 2016-04-13 NOTE — ED Notes (Signed)
Pt retu

## 2016-04-14 DIAGNOSIS — M150 Primary generalized (osteo)arthritis: Secondary | ICD-10-CM | POA: Diagnosis not present

## 2016-04-14 DIAGNOSIS — T84030D Mechanical loosening of internal right hip prosthetic joint, subsequent encounter: Secondary | ICD-10-CM | POA: Diagnosis not present

## 2016-04-14 DIAGNOSIS — E669 Obesity, unspecified: Secondary | ICD-10-CM | POA: Diagnosis not present

## 2016-04-14 DIAGNOSIS — E1142 Type 2 diabetes mellitus with diabetic polyneuropathy: Secondary | ICD-10-CM | POA: Diagnosis not present

## 2016-04-14 DIAGNOSIS — I10 Essential (primary) hypertension: Secondary | ICD-10-CM | POA: Diagnosis not present

## 2016-04-14 DIAGNOSIS — E785 Hyperlipidemia, unspecified: Secondary | ICD-10-CM | POA: Diagnosis not present

## 2016-04-16 ENCOUNTER — Encounter: Payer: Self-pay | Admitting: Neurology

## 2016-04-16 NOTE — Telephone Encounter (Signed)
A new stroke is definably a reason for ASAP revisit. I forward this to Dr Jaynee Eagles Nurse. CD

## 2016-04-20 ENCOUNTER — Encounter: Payer: Self-pay | Admitting: Cardiology

## 2016-04-20 ENCOUNTER — Ambulatory Visit (INDEPENDENT_AMBULATORY_CARE_PROVIDER_SITE_OTHER): Payer: Medicare Other | Admitting: *Deleted

## 2016-04-20 DIAGNOSIS — I6340 Cerebral infarction due to embolism of unspecified cerebral artery: Secondary | ICD-10-CM

## 2016-04-20 LAB — CUP PACEART INCLINIC DEVICE CHECK: MDC IDC SESS DTM: 20170731184527

## 2016-04-20 NOTE — Progress Notes (Signed)
Wound check appointment. Steri-strips removed. Wound without redness or edema. Incision edges approximated, wound well healed. Normal device function. Battery status: good. R-waves 0.23m. No symptom, tachy, pause, brady, or AF episodes. Monthly summary reports and ROV with WC PRN.

## 2016-04-21 DIAGNOSIS — Z7901 Long term (current) use of anticoagulants: Secondary | ICD-10-CM | POA: Diagnosis not present

## 2016-04-21 DIAGNOSIS — I639 Cerebral infarction, unspecified: Secondary | ICD-10-CM | POA: Diagnosis not present

## 2016-04-21 DIAGNOSIS — E119 Type 2 diabetes mellitus without complications: Secondary | ICD-10-CM | POA: Diagnosis not present

## 2016-04-21 DIAGNOSIS — S7001XD Contusion of right hip, subsequent encounter: Secondary | ICD-10-CM | POA: Diagnosis not present

## 2016-04-21 DIAGNOSIS — I82411 Acute embolism and thrombosis of right femoral vein: Secondary | ICD-10-CM | POA: Diagnosis not present

## 2016-04-21 DIAGNOSIS — I10 Essential (primary) hypertension: Secondary | ICD-10-CM | POA: Diagnosis not present

## 2016-04-28 DIAGNOSIS — M25551 Pain in right hip: Secondary | ICD-10-CM | POA: Diagnosis not present

## 2016-05-07 DIAGNOSIS — I82402 Acute embolism and thrombosis of unspecified deep veins of left lower extremity: Secondary | ICD-10-CM | POA: Diagnosis not present

## 2016-05-07 DIAGNOSIS — I639 Cerebral infarction, unspecified: Secondary | ICD-10-CM | POA: Diagnosis not present

## 2016-05-07 DIAGNOSIS — D62 Acute posthemorrhagic anemia: Secondary | ICD-10-CM | POA: Diagnosis not present

## 2016-05-07 DIAGNOSIS — Z7901 Long term (current) use of anticoagulants: Secondary | ICD-10-CM | POA: Diagnosis not present

## 2016-05-08 ENCOUNTER — Encounter: Payer: Self-pay | Admitting: Hematology

## 2016-05-08 ENCOUNTER — Telehealth: Payer: Self-pay | Admitting: Hematology

## 2016-05-08 NOTE — Telephone Encounter (Signed)
Pt confirmed appt, intake completed, faxed referring provider appt date/time, pt letter mailed

## 2016-05-11 ENCOUNTER — Ambulatory Visit (INDEPENDENT_AMBULATORY_CARE_PROVIDER_SITE_OTHER): Payer: Medicare Other | Admitting: *Deleted

## 2016-05-11 ENCOUNTER — Encounter: Payer: Self-pay | Admitting: Hematology

## 2016-05-11 DIAGNOSIS — I6340 Cerebral infarction due to embolism of unspecified cerebral artery: Secondary | ICD-10-CM

## 2016-05-11 NOTE — Progress Notes (Signed)
[facility] Summary Report / Loop Recorder

## 2016-05-14 DIAGNOSIS — D62 Acute posthemorrhagic anemia: Secondary | ICD-10-CM | POA: Diagnosis not present

## 2016-05-18 DIAGNOSIS — E7800 Pure hypercholesterolemia, unspecified: Secondary | ICD-10-CM | POA: Diagnosis not present

## 2016-05-18 DIAGNOSIS — D352 Benign neoplasm of pituitary gland: Secondary | ICD-10-CM | POA: Diagnosis not present

## 2016-05-18 DIAGNOSIS — Z9884 Bariatric surgery status: Secondary | ICD-10-CM | POA: Diagnosis not present

## 2016-05-18 DIAGNOSIS — E119 Type 2 diabetes mellitus without complications: Secondary | ICD-10-CM | POA: Diagnosis not present

## 2016-05-19 DIAGNOSIS — M25551 Pain in right hip: Secondary | ICD-10-CM | POA: Diagnosis not present

## 2016-05-22 DIAGNOSIS — E782 Mixed hyperlipidemia: Secondary | ICD-10-CM | POA: Diagnosis not present

## 2016-05-22 DIAGNOSIS — D649 Anemia, unspecified: Secondary | ICD-10-CM | POA: Diagnosis not present

## 2016-05-22 DIAGNOSIS — I10 Essential (primary) hypertension: Secondary | ICD-10-CM | POA: Diagnosis not present

## 2016-05-22 DIAGNOSIS — I679 Cerebrovascular disease, unspecified: Secondary | ICD-10-CM | POA: Diagnosis not present

## 2016-05-22 DIAGNOSIS — D352 Benign neoplasm of pituitary gland: Secondary | ICD-10-CM | POA: Diagnosis not present

## 2016-05-22 DIAGNOSIS — E119 Type 2 diabetes mellitus without complications: Secondary | ICD-10-CM | POA: Diagnosis not present

## 2016-05-24 ENCOUNTER — Telehealth: Payer: Self-pay | Admitting: Hematology

## 2016-05-24 NOTE — Telephone Encounter (Signed)
[facility] COVERING AP- MOVED 9/6 APPOINTMENTS TO 9/8. SPOKE WITH PATIENT SHE IS AWARE.

## 2016-05-27 ENCOUNTER — Ambulatory Visit: Payer: Medicare Other | Admitting: Hematology

## 2016-05-29 ENCOUNTER — Telehealth: Payer: Self-pay | Admitting: Hematology

## 2016-05-29 ENCOUNTER — Encounter: Payer: Self-pay | Admitting: Hematology

## 2016-05-29 ENCOUNTER — Ambulatory Visit (HOSPITAL_BASED_OUTPATIENT_CLINIC_OR_DEPARTMENT_OTHER): Payer: Medicare Other

## 2016-05-29 ENCOUNTER — Ambulatory Visit (HOSPITAL_BASED_OUTPATIENT_CLINIC_OR_DEPARTMENT_OTHER): Payer: Medicare Other | Admitting: Hematology

## 2016-05-29 VITALS — BP 157/80 | HR 86 | Temp 98.1°F | Resp 18 | Ht 63.0 in | Wt 246.4 lb

## 2016-05-29 DIAGNOSIS — D509 Iron deficiency anemia, unspecified: Secondary | ICD-10-CM

## 2016-05-29 DIAGNOSIS — D62 Acute posthemorrhagic anemia: Secondary | ICD-10-CM

## 2016-05-29 DIAGNOSIS — Z9884 Bariatric surgery status: Secondary | ICD-10-CM

## 2016-05-29 LAB — CBC & DIFF AND RETIC
BASO%: 0.5 % (ref 0.0–2.0)
BASOS ABS: 0 10*3/uL (ref 0.0–0.1)
EOS%: 1.1 % (ref 0.0–7.0)
Eosinophils Absolute: 0.1 10*3/uL (ref 0.0–0.5)
HEMATOCRIT: 37.8 % (ref 34.8–46.6)
HGB: 11.7 g/dL (ref 11.6–15.9)
Immature Retic Fract: 10.3 % — ABNORMAL HIGH (ref 1.60–10.00)
LYMPH%: 21.4 % (ref 14.0–49.7)
MCH: 25.2 pg (ref 25.1–34.0)
MCHC: 31 g/dL — AB (ref 31.5–36.0)
MCV: 81.5 fL (ref 79.5–101.0)
MONO#: 0.6 10*3/uL (ref 0.1–0.9)
MONO%: 6.8 % (ref 0.0–14.0)
NEUT#: 6 10*3/uL (ref 1.5–6.5)
NEUT%: 70.2 % (ref 38.4–76.8)
PLATELETS: 233 10*3/uL (ref 145–400)
RBC: 4.64 10*6/uL (ref 3.70–5.45)
RDW: 15.3 % — AB (ref 11.2–14.5)
Retic %: 1.15 % (ref 0.70–2.10)
Retic Ct Abs: 53.36 10*3/uL (ref 33.70–90.70)
WBC: 8.6 10*3/uL (ref 3.9–10.3)
lymph#: 1.8 10*3/uL (ref 0.9–3.3)

## 2016-05-29 LAB — IRON AND TIBC
%SAT: 7 % — AB (ref 21–57)
IRON: 27 ug/dL — AB (ref 41–142)
TIBC: 369 ug/dL (ref 236–444)
UIBC: 342 ug/dL (ref 120–384)

## 2016-05-29 LAB — COMPREHENSIVE METABOLIC PANEL
ALT: 11 U/L (ref 0–55)
ANION GAP: 10 meq/L (ref 3–11)
AST: 18 U/L (ref 5–34)
Albumin: 3.8 g/dL (ref 3.5–5.0)
Alkaline Phosphatase: 108 U/L (ref 40–150)
BUN: 12.8 mg/dL (ref 7.0–26.0)
CALCIUM: 9.6 mg/dL (ref 8.4–10.4)
CHLORIDE: 103 meq/L (ref 98–109)
CO2: 28 mEq/L (ref 22–29)
Creatinine: 0.8 mg/dL (ref 0.6–1.1)
EGFR: 78 mL/min/{1.73_m2} — AB (ref >90)
Glucose: 113 mg/dl (ref 70–140)
Potassium: 4.4 mEq/L (ref 3.5–5.1)
Sodium: 141 mEq/L (ref 136–145)
Total Bilirubin: 0.38 mg/dL (ref 0.20–1.20)
Total Protein: 7.7 g/dL (ref 6.4–8.3)

## 2016-05-29 LAB — FERRITIN: FERRITIN: 31 ng/mL (ref 9–269)

## 2016-05-29 MED ORDER — B COMPLEX VITAMINS PO CAPS: 1.0000 | ORAL_CAPSULE | Freq: Every day | ORAL | 2 refills | Status: DC

## 2016-05-29 MED ORDER — POLYSACCHARIDE IRON COMPLEX 150 MG PO CAPS: 150.0000 mg | ORAL_CAPSULE | Freq: Two times a day (BID) | ORAL | 2 refills | Status: DC

## 2016-05-29 NOTE — Progress Notes (Signed)
[name]    HEMATOLOGY/ONCOLOGY CONSULTATION NOTE  Date of Service: 05/29/2016  Patient Care Team: Antony Blackbird, MD as PCP - General (Family Medicine)  CHIEF COMPLAINTS/PURPOSE OF CONSULTATION:  Evaluation of anemia  HISTORY OF PRESENTING ILLNESS:   [name] [name] is a wonderful 65 y.o. female who has been referred to [place] by Dr .Antony Blackbird, MD  for evaluation and management of Anemia.  Patient has a history of hypertension, dyslipidemia, morbid obesity, sleep apnea, diabetes with recent multiple CVAs, left lower extremity DVT in July 2017 currently on Xarelto , newly diagnosed pituitary adenoma status post surgery on 02/28/2016 who has been referred for evaluation of anemia.  Patient reports that she has had her gastric bypass surgery in 2012 and was on oral iron replacement for a while but then quit.  Her CBC on 02/26/2016 showed a hemoglobin of 11.9 with an MCV of 80.6 with normal platelets and WBC count. Patient had endoscopic surgery for left sphenoidal mass which was noted to be pituitary adenoma on 02/28/2016.  CBC showed a hemoglobin of 12.7 with an MCV of 83.2 on 03/19/2016.  Patient had right total hip ReVision on 03/30/2016 and notes that she subsequently had a large postoperative hematoma. Hemoglobin on 04/01/2016 dropped to 9.6 with an MCV of 82.5. She had labs with her primary care physician on 04/10/2016 that showed a hemoglobin of 9.6 with an MCV of 84, Ferritin level of 26, B12 843, RBC folate within normal limits.  Patient reports that she has been taking B12 supplementation. On 04/10/2016 she was noted to have an acute DVT involving a small segment of her mid left femoral vein and has been started on Xarelto. Patient notes that she had a stool occult blood testing which was negative. Reports no other acute bleeding.  Has been taking Wellesse for iron replacement which contains only 18 mg of elemental iron daily.  Patient's blood counts today have improved to a hemoglobin of 11.7  with an MCV of 81.5, ferritin 31, iron saturation of 7%. WBC count and platelets within normal limits.  Patient reports she is feeling better and has no other acute concerns and prefers to continue the oral iron. She notes that she tries to minimize her medical contact as much as possible.   MEDICAL HISTORY:  Past Medical History:  Diagnosis Date  . Arthritis    "knees, hips, possibly back" (04/08/2016)  . Chronic bronchitis ([place])    "as a child; haven't had it in years" (04/08/2016)  . Chronic lower back pain    spondylosis  . Daily headache    "short ones for the last month or 2" (04/08/2016)  . GERD (gastroesophageal reflux disease)    takes Protonix daily "just to protect my stomach; not for reflux" (04/08/2016)  . Heart murmur    "dx'd by 1 anesthesiologist years and years ago"  . Hyperlipidemia    takes Crestor daily  . Hypertension    takes Valsartan-HCTZ daily  . Joint pain   . Macular degeneration    "just watching"  . Nocturia   . Peripheral edema   . Peripheral neuropathy ([facility])    not on any meds  . Sleep apnea    no CPAP since weight loss after bariatric surgery '12 (04/08/2016)  . Spondylosis   . Stroke ([place]) 04/07/2016   "little numbness in pinky of left hand" (04/08/2016)  . TIA (transient ischemic attack) 01/2016 X 2   Numbness on right mouth/gums/lips; & numbness in 3rd, 4th, & 5th  right digits since" (04/08/2016)  . Type 2 diabetes, diet controlled ([place])   . Urinary frequency   . Urinary incontinence   . Urinary urgency     SURGICAL HISTORY: Past Surgical History:  Procedure Laterality Date  . CATARACT EXTRACTION W/ INTRAOCULAR LENS  IMPLANT, BILATERAL Bilateral ~ 2012  . COLONOSCOPY    . EP IMPLANTABLE DEVICE N/A 04/10/2016   Procedure: Loop Recorder Insertion;  Surgeon: Will Meredith Leeds, MD;  Location: [facility];  Service: Cardiovascular;  Laterality: N/A;  . ESOPHAGOGASTRODUODENOSCOPY     in the 70's  . JOINT REPLACEMENT    . ROUX-EN-Y  GASTRIC BYPASS  11/24/10  . SINUS ENDO W/FUSION  02/28/2016   Procedure: ENDOSCOPIC SINUS SURGERY WITH NAVIGATION;  Surgeon: Ruby Cola, MD;  Location: [facility];  Service: ENT;;  . TEE WITHOUT CARDIOVERSION N/A 04/10/2016   Procedure: TRANSESOPHAGEAL ECHOCARDIOGRAM (TEE) POSSIBLE LOOP;  Surgeon: Sanda Klein, MD;  Location: [facility];  Service: Cardiovascular;  Laterality: N/A;  . TONSILLECTOMY  1957  . TOTAL HIP ARTHROPLASTY Right 2008  . TOTAL HIP REVISION Right 03/30/2016   Procedure: RIGHT TOTAL HIP REVISION;  Surgeon: Frederik Pear, MD;  Location: [facility];  Service: Orthopedics;  Laterality: Right;  . TOTAL KNEE ARTHROPLASTY Bilateral 2001  . VAGINAL HYSTERECTOMY     "left my ovaries"    SOCIAL HISTORY: Social History   Social History  . Marital status: Single    Spouse name: N/A  . Number of children: 0  . Years of education: college   Occupational History  . Retired    Social History Main Topics  . Smoking status: Former Smoker    Packs/day: 1.00    Years: 40.00    Types: Cigarettes    Quit date: 09/17/2007  . Smokeless tobacco: Never Used  . Alcohol use 0.0 oz/week     Comment: 04/08/2016 "5-6 drinks/year'  . Drug use:     Types: Marijuana     Comment: 04/08/2016 "did some marijuana in college"  . Sexual activity: No   Other Topics Concern  . Not on file   Social History Narrative   Drinks 3 cups of coffee a day     FAMILY HISTORY: Family History  Problem Relation Age of Onset  . Prostate cancer Father   . Lymphoma Father   . Congestive Heart Failure Maternal Grandmother   . Lung cancer Maternal Grandfather     ALLERGIES:  is allergic to ambien [zolpidem]; atorvastatin; codeine; penicillins; simvastatin; and byetta 10 mcg pen [exenatide].  MEDICATIONS:  Current Outpatient Prescriptions  Medication Sig Dispense Refill  . Coenzyme Q10 (CO Q-10) 400 MG CAPS Take by mouth.    . beta carotene w/minerals (OCUVITE) tablet Take 1 tablet by mouth daily.    .  magnesium oxide (MAG-OX) 400 MG tablet Take 400 mg by mouth at bedtime.     . Rivaroxaban 15 & 20 MG TBPK Take as directed on package: Start with one 45m tablet by mouth twice a day with food. On Day 22, switch to one 260mtablet once a day with food. (Patient taking differently: Take 20 mg by mouth See admin instructions. 6 month course started 04/11/16: Take as directed on package: Start with one 1534mablet by mouth twice a day with food. On Day 22, switch to one 74m43mblet once a day with food.) 51 each 0  . rosuvastatin (CRESTOR) 20 MG tablet Take 1 tablet (20 mg total) by mouth daily. (Patient taking differently: Take  20 mg by mouth at bedtime. ) 30 tablet 0   No current facility-administered medications for this visit.     REVIEW OF SYSTEMS:    10 Point review of Systems was done is negative except as noted above.  PHYSICAL EXAMINATION: ECOG PERFORMANCE STATUS: 2 - Symptomatic, <50% confined to bed  . Vitals:   05/29/16 1215  BP: (!) 157/80  Pulse: 86  Resp: 18  Temp: 98.1 F (36.7 C)   Filed Weights   05/29/16 1215  Weight: 246 lb 6.4 oz (111.8 kg)   .Body mass index is 43.65 kg/m.  GENERAL:alert, in no acute distress and comfortable SKIN: skin color, texture, turgor are normal, no rashes or significant lesions EYES: normal, conjunctiva are pink and non-injected, sclera clear OROPHARYNX:no exudate, no erythema and lips, buccal mucosa, and tongue normal  NECK: supple, no JVD, thyroid normal size, non-tender, without nodularity LYMPH:  no palpable lymphadenopathy in the cervical, axillary or inguinal LUNGS: clear to auscultation with normal respiratory effort HEART: regular rate & rhythm,  no murmurs and no lower extremity edema ABDOMEN: abdomen soft, non-tender, normoactive bowel sounds  Musculoskeletal: no cyanosis of digits and no clubbing  PSYCH: alert & oriented x 3 with fluent speech NEURO: no focal motor/sensory deficits  LABORATORY DATA:  I have reviewed the  data as listed  . CBC Latest Ref Rng & Units 05/29/2016 04/13/2016 04/13/2016  WBC 3.9 - 10.3 10e3/uL 8.6 - 16.0(H)  Hemoglobin 11.6 - 15.9 g/dL 11.7 9.9(L) 9.6(L)  Hematocrit 34.8 - 46.6 % 37.8 29.0(L) 30.5(L)  Platelets 145 - 400 10e3/uL 233 - 318    . CMP Latest Ref Rng & Units 05/29/2016 04/13/2016 04/10/2016  Glucose 70 - 140 mg/dl 113 208(H) 92  BUN 7.0 - 26.0 mg/dL 12._0 Creatinine 0.6 - 1.1 mg/dL 0.8 0.70 0.93  Sodium 136 - 145 mEq/L 141 135 139  Potassium 3.5 - 5.1 mEq/L 4.4 4.2 4.1  Chloride 101 - 111 mmol/L - 100(L) 104  CO2 22 - 29 mEq/L 28 - 27  Calcium 8.4 - 10.4 mg/dL 9.6 - 8.6(L)  Total Protein 6.4 - 8.3 g/dL 7.7 - 5.4(L)  Total Bilirubin 0.20 - 1.20 mg/dL 0.38 - 0.5  Alkaline Phos 40 - 150 U/L 108 - 89  AST 5 - 34 U/L 18 - 18  ALT 0 - 55 U/L 11 - 9(L)   . Lab Results  Component Value Date   IRON 27 (L) 05/29/2016   TIBC 369 05/29/2016   IRONPCTSAT 7 (L) 05/29/2016   (Iron and TIBC)  Lab Results  Component Value Date   FERRITIN 31 05/29/2016     RADIOGRAPHIC STUDIES: I have personally reviewed the radiological images as listed and agreed with the findings in the report. No results found.  ASSESSMENT & PLAN:   65 year old Caucasian female with multiple medical comorbidities as noted about with  1) normocytic normochromic anemia. MCV low normal. Anemia is predominantly due to acute blood loss related to her recent right hip total revision followed by a postoperative hematoma. She also appears to have had an element of iron deficiency which could be from her blood loss compounded by the fact that she has a history of previous gastric bypass surgery that could limit oral iron absorption. She was not on routine iron replacement. She is currently on anticoagulation for a left lower extremity DVT but has no other overt bleeding. Her hemoglobin levels have improved from 9.6 to 11.7 with minimal oral iron replacement. PLAN -  patient was getting suboptimal  amounts of oral iron of only 18 mg po daily.  -She will need more aggressive oral iron replacement in the setting of decreased oral iron absorption from her gastric bypass surgery and current PPI therapy. -She was recommended to take iron polysaccharide 150 mg by mouth twice a day with orange juice. -She should continue her regular B12 replacement. -She was also recommended to take a B complex daily given likely deficiencies in the setting of gastric bypass surgery. -Will need vitamin D monitoring and replacement as per primary care physician. -Patient prefers to continue follow-up with her primary care physician. She prefers to avoid IV iron unless absolutely needed. Given her hemoglobin is improving no acute indication for IV iron at this time. -Follow-up with primary care physician in 3 months to recheck CBC, ferritin, iron profile and B12 level. -If the patient shows signs of iron deficiency despite compliance with oral iron ordered the patient does not tolerate oral iron we might need to consider IV iron replacement in the future. -Monitor for GI losses on active anticoagulation for DVT.  I appreciate this interesting consultation. Please reconsult [place] if needed in the future if the patient shows progressive iron deficiency despite adequate oral iron replacement. Continue follow-up with primary care physician  All of the patients questions were answered with apparent satisfaction. The patient knows to call the clinic with any problems, questions or concerns.  I spent 55 minutes counseling the patient face to face. The total time spent in the appointment was 60 minutes and more than 50% was on counseling and direct patient cares.    Sullivan Lone MD [place] AAHIVMS [facility] Hematology/Oncology Physician [facility]  (Office):       [phone] (Work cell):  [phone] (Fax):           [phone]  05/29/2016 12:47 PM

## 2016-05-29 NOTE — Telephone Encounter (Signed)
Per 9/8 los f/u as needed. Patient informed and sent to lab as requested per los.

## 2016-06-07 LAB — CUP PACEART REMOTE DEVICE CHECK: Date Time Interrogation Session: 20170820230604

## 2016-06-07 NOTE — Progress Notes (Signed)
Carelink summary report received. Battery status OK. Normal device function. No new symptom episodes, tachy episodes, brady, or pause episodes. No new AF episodes. Good histograms. Monthly summary reports and ROV/PRN

## 2016-06-09 ENCOUNTER — Ambulatory Visit (INDEPENDENT_AMBULATORY_CARE_PROVIDER_SITE_OTHER): Payer: Medicare Other | Admitting: *Deleted

## 2016-06-09 ENCOUNTER — Encounter: Payer: Self-pay | Admitting: Neurology

## 2016-06-09 ENCOUNTER — Ambulatory Visit (INDEPENDENT_AMBULATORY_CARE_PROVIDER_SITE_OTHER): Payer: Medicare Other | Admitting: Neurology

## 2016-06-09 VITALS — BP 157/91 | HR 73 | Ht 63.0 in | Wt 252.6 lb

## 2016-06-09 DIAGNOSIS — G5622 Lesion of ulnar nerve, left upper limb: Secondary | ICD-10-CM | POA: Diagnosis not present

## 2016-06-09 DIAGNOSIS — I6340 Cerebral infarction due to embolism of unspecified cerebral artery: Secondary | ICD-10-CM | POA: Diagnosis not present

## 2016-06-09 DIAGNOSIS — I639 Cerebral infarction, unspecified: Secondary | ICD-10-CM

## 2016-06-09 DIAGNOSIS — G6289 Other specified polyneuropathies: Secondary | ICD-10-CM

## 2016-06-09 NOTE — Patient Instructions (Signed)
Remember to drink plenty of fluid, eat healthy meals and do not skip any meals. Try to eat protein with a every meal and eat a healthy snack such as fruit or nuts in between meals. Try to keep a regular sleep-wake schedule and try to exercise daily, particularly in the form of walking, 20-30 minutes a day, if you can.   As far as diagnostic testing: emg/ncs and follow up appointment  I would like to see you back in for neuropathy, sooner if we need to. Please call us with any interim questions, concerns, problems, updates or refill requests.   Our phone number is [phone]. We also have an after hours call service for urgent matters and there is a physician on-call for urgent questions. For any emergencies you know to call 911 or go to the nearest emergency room

## 2016-06-09 NOTE — Progress Notes (Signed)
[name]    Provider:  Dr Jaynee Eagles Referring Provider: Antony Blackbird, MD Primary Care Physician:  Antony Blackbird, MD  CC:  Stroke, Cavernous sinus mass  Interval history 06/09/2016: Since being seen patient has had another stroke. She was admitted 04/10/2016. She had newly identified clot by LE by doppler but TCD bubble study was negative. She was discharged on Xarelto. Loop recorder was placed. She presented to the ED with slurred speech. Most symptoms have resolved. She cannot walk very well, her legs and feet are very numb. She has numbness in the feet and legs. She has imbalance. And she has had a history of B12 deficiency. She still has the mouth numbness and right hand sensory changes from previous stroke but the aphasia is resolved.   Ct Head Wo Contrast 04/08/2016  1. 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 2. New focal low-density in the left posterior frontal periventricular white matter, potentially a subacute small vessel infarct. Extensive chronic small vessel ischemic changes in the periventricular white matter are otherwise stable. No evidence of acute cortical stroke or hemorrhage.   Mr Brain Wo Contrast 04/08/2016  1. Confirmation of acute nonhemorrhagic white matter infarct involving the left corona radiata and posterior centrum semi of ally. 2. Otherwise stable severe periventricular and diffuse subcortical T2 hyperintensities, advanced for age. This represents the sequela of chronic microvascular ischemia. 3. 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   Ct Angio Head & Neck W Or Wo Contrast 04/09/2016  1. Positive for arterial abnormalities associated with the acute white matter ischemia: Severe stenosis at the origin of a left MCA middle sylvian division M2 branch. Moderate irregularity and stenosis  in anterior division left M2/ M3 branches. No left MCA M1 stenosis or occlusion. 2. Otherwise carotid bifurcation and ICA siphon atherosclerosis without hemodynamically significant stenosis. 3. Negative posterior circulation. 4. Stable CT appearance of the brain since yesterday. No associated hemorrhage or mass effect. 5. Abnormal CT appearance of the sella turcica and left cavernous sinus corresponding to the chronic invasive pituitary adenoma. 6. No acute findings in the neck.   LE venous dopplers Right lower extremity is negative for deep vein thrombosis. The left lower extremity is positive for deep vein thrombosis involving a small segment of the left mid femoral vein. There is no evidence of Baker's cyst bilaterally  TEE Normal TEE. No cardioembolic source.  TCD bubble study - negative for PFO  HPI:  [name] [name] is a 65 y.o. female here as a follow-up from the hospital after a stroke. She still has numbness in the right fingers and moth due to the left thalamic stroke. We discussed her strokes, she is due for Cardiac Monitoring and then possibly Loop. Will discuss with our Vascular doctors. Also discussed at length the invasive pituitary tumor shown, I reviewed imaging with patient and pointed out findingd, discussed next steps including MRI w/wo with pituitary protocol as well as referral to [facility].She was not on an aspirin when this happened.   Reviewed notes, labs and imaging from outside physicians, which showed:  IMPRESSION: 1. Acute lacunar infarct in the left thalamus. 2. Possible subacute lacunar infarct in the right splenium of the corpus callosum. 3. Moderate chronic small vessel ischemic disease. 4. Mass involving the sella, left cavernous sinus, and left sphenoid sinus, suspicious for invasive pituitary macroadenoma. Further evaluation with nonemergent pituitary protocol MRI (  without and with contrast) is recommended. NEUROSURGERY REVIEWED AND FEELS THIS IS A MASS OF THE  SPHENOID SINUS THAT HAS INVADED THE CAVERNOUS SINUS,NOT PITUITARY TUMOR 5. No major intracranial arterial occlusion or significant proximal posterior circulation stenosis. 6. Severe proximal left M2 MCA stenoses.  Review of Systems: Patient complains of symptoms per HPI as well as the following symptoms: No CP, no SOB, no fever, no chills. Pertinent negatives per HPI. All others negative.   Social History   Social History  . Marital status: Single    Spouse name: N/A  . Number of children: 0  . Years of education: college   Occupational History  . Retired    Social History Main Topics  . Smoking status: Former Smoker    Packs/day: 1.00    Years: 40.00    Types: Cigarettes    Quit date: 09/17/2007  . Smokeless tobacco: Never Used  . Alcohol use 0.0 oz/week     Comment: 04/08/2016 "5-6 drinks/year'  . Drug use:     Types: Marijuana     Comment: 04/08/2016 "did some marijuana in college"  . Sexual activity: No   Other Topics Concern  . Not on file   Social History Narrative   Drinks 3 cups of coffee a day     Family History  Problem Relation Age of Onset  . Prostate cancer Father   . Lymphoma Father   . Congestive Heart Failure Maternal Grandmother   . Lung cancer Maternal Grandfather     Past Medical History:  Diagnosis Date  . Arthritis    "knees, hips, possibly back" (04/08/2016)  . Chronic bronchitis ([place])    "as a child; haven't had it in years" (04/08/2016)  . Chronic lower back pain    spondylosis  . Daily headache    "short ones for the last month or 2" (04/08/2016)  . GERD (gastroesophageal reflux disease)    takes Protonix daily "just to protect my stomach; not for reflux" (04/08/2016)  . Heart murmur    "dx'd by 1 anesthesiologist years and years ago"  . Hyperlipidemia    takes Crestor daily  . Hypertension    takes Valsartan-HCTZ daily  . Joint pain   . Macular degeneration    "just watching"  . Nocturia   . Peripheral edema   . Peripheral  neuropathy ([facility])    not on any meds  . Sleep apnea    no CPAP since weight loss after bariatric surgery '12 (04/08/2016)  . Spondylosis   . Stroke ([place]) 04/07/2016   "little numbness in pinky of left hand" (04/08/2016)  . TIA (transient ischemic attack) 01/2016 X 2   Numbness on right mouth/gums/lips; & numbness in 3rd, 4th, & 5th right digits since" (04/08/2016)  . Type 2 diabetes, diet controlled ([place])   . Urinary frequency   . Urinary incontinence   . Urinary urgency     Past Surgical History:  Procedure Laterality Date  . CATARACT EXTRACTION W/ INTRAOCULAR LENS  IMPLANT, BILATERAL Bilateral ~ 2012  . COLONOSCOPY    . EP IMPLANTABLE DEVICE N/A 04/10/2016   Procedure: Loop Recorder Insertion;  Surgeon: Will Meredith Leeds, MD;  Location: [facility];  Service: Cardiovascular;  Laterality: N/A;  . ESOPHAGOGASTRODUODENOSCOPY     in the 70's  . JOINT REPLACEMENT    . 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 SINUS ENDO W/FUSION  02/28/2016   Procedure: ENDOSCOPIC SINUS SURGERY WITH NAVIGATION;  Surgeon: Ruby Cola, MD;  Location:  [facility];  Service: ENT;;  . TEE WITHOUT CARDIOVERSION N/A 04/10/2016   Procedure: TRANSESOPHAGEAL ECHOCARDIOGRAM (TEE) POSSIBLE LOOP;  Surgeon: Sanda Klein, MD;  Location: [facility];  Service: Cardiovascular;  Laterality: N/A;  . TONSILLECTOMY  1957  . TOTAL HIP ARTHROPLASTY Right 2008  . TOTAL HIP REVISION Right 03/30/2016   Procedure: RIGHT TOTAL HIP REVISION;  Surgeon: Frederik Pear, MD;  Location: [facility];  Service: Orthopedics;  Laterality: Right;  . TOTAL KNEE ARTHROPLASTY Bilateral 2001  . VAGINAL HYSTERECTOMY     "left my ovaries"    Current Outpatient Prescriptions  Medication Sig Dispense Refill  . b complex vitamins capsule Take 1 capsule by mouth daily. 30 capsule 2  . beta carotene w/minerals (OCUVITE) tablet Take 1 tablet by mouth daily.    . Coenzyme Q10 (CO Q-10) 400 MG CAPS Take by mouth.    . iron polysaccharides (NIFEREX) 150 MG capsule  Take 1 capsule (150 mg total) by mouth 2 (two) times daily. 60 capsule 2  . magnesium oxide (MAG-OX) 400 MG tablet Take 400 mg by mouth at bedtime.     . rosuvastatin (CRESTOR) 20 MG tablet Take 1 tablet (20 mg total) by mouth daily. (Patient taking differently: Take 20 mg by mouth at bedtime. ) 30 tablet 0  . XARELTO 20 MG TABS tablet Take 20 mg by mouth daily.     No current facility-administered medications for this visit.     Allergies as of 06/09/2016 - Review Complete 06/09/2016  Allergen Reaction Noted  . Ambien [zolpidem] Other (See Comments) 03/27/2016  . Atorvastatin Other (See Comments) 02/24/2016  . Codeine Nausea And Vomiting 05/29/2011  . Penicillins Itching and Other (See Comments) 05/29/2011  . Simvastatin Other (See Comments) 03/27/2016  . Byetta 10 mcg pen [exenatide] Nausea Only 03/27/2016    Vitals: BP (!) 157/91 (BP Location: Right Arm, Patient Position: Sitting, Cuff Size: Large)   Pulse 73   Ht _0  (1.6 m)   Wt 252 lb 9.6 oz (114.6 kg)   BMI 44.75 kg/m  Last Weight:  Wt Readings from Last 1 Encounters:  06/09/16 252 lb 9.6 oz (114.6 kg)   Last Height:   Ht Readings from Last 1 Encounters:  06/09/16 _1  (1.6 m)   Physical exam: Exam: Gen: NAD, conversant, well nourised, obese, well groomed                     CV: RRR, no MRG. No Carotid Bruits. No peripheral edema, warm, nontender Eyes: Conjunctivae clear without exudates or hemorrhage  Neuro: Detailed Neurologic Exam  Speech:    Speech is normal; fluent and spontaneous with normal comprehension.  Cognition:    The patient is oriented to person, place, and time;     recent and remote memory intact;     language fluent;     normal attention, concentration,     fund of knowledge Cranial Nerves:    The pupils are equal, round, and reactive to light. The fundi are normal and spontaneous venous pulsations are present. Visual fields are full to finger confrontation. Extraocular movements are  intact. Trigeminal sensation is intact and the muscles of mastication are normal. The face is symmetric. The palate elevates in the midline. Hearing intact. Voice is normal. Shoulder shrug is normal. The tongue has normal motion without fasciculations.   Coordination:    Normal finger to nose and heel to shin. Normal rapid alternating movements.   Gait:    Heel-toe and  tandem gait are normal.   Motor Observation:    No asymmetry, no atrophy, and no involuntary movements noted. Tone:    Normal muscle tone.    Posture:    Posture is normal. normal erect    Strength:    Strength is V/V in the upper and lower limbs.      Sensation: intact to LT     Reflex Exam:  DTR's:    Deep tendon reflexes in the upper and lower extremities are normal bilaterally.   Toes:    The toes are downgoing bilaterally.   Clonus:    Clonus is absent.  Assessment and Plan: [name] [name] is a 65 y.o. female with history of HTN, HLD,TIA/stroke, diet controlled DM type 2, peripheral neuropathy,s/p gastric bypass. .  L corona radiata/posterior centrum semiovale infarct, etiology not clear. She had left thalamic and right splenium punctate infarcts in 01/2016, suspicious for cardioembolic infarcts. Pt does have severe L M2 inferior division stenosis and recent hypotension, but the distribution of M2 inferior does not fit into these infarcts. TEE normal and loop placed. Found to have DVT likely due to recent hip surgery but TCD bubble study no PFO.     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  - Follow up appointment needed for for neuropathy in the feet - Loop recorder placed 7/21 - TCD bubble negative for PF -  LDL 53,  HgbA1c 6.0 - Recommend anticoagulation for 6-12 months -  BP goal 130-150 due to left M2 severe stenosis - LDL 53, goal < 70, Continue statin at discharge - HgbA1c 6.0, goal < 7.0 - Hx OSA, no CPAP since bariatric  surgery in 2012  Sarina Ill, MD  [facility] [address] [place], [place] [address]  Phone [phone] Fax [phone]  A total of 30 minutes was spent face-to-face with this patient. Over half this time was spent on counseling patient on the embolic stroke, neuropathy diagnosis and different diagnostic and therapeutic options available.

## 2016-06-10 NOTE — Progress Notes (Signed)
[facility] Summary Report / Loop Recorder

## 2016-06-24 ENCOUNTER — Encounter (HOSPITAL_COMMUNITY): Payer: Self-pay

## 2016-07-04 LAB — CUP PACEART REMOTE DEVICE CHECK: Date Time Interrogation Session: 20170919230906

## 2016-07-04 NOTE — Progress Notes (Signed)
Carelink summary report received. Battery status OK. Normal device function. No new symptom episodes, tachy episodes, brady, or pause episodes. No new AF episodes. Monthly summary reports and ROV/PRN

## 2016-07-06 DIAGNOSIS — I639 Cerebral infarction, unspecified: Secondary | ICD-10-CM | POA: Diagnosis not present

## 2016-07-06 DIAGNOSIS — Z7901 Long term (current) use of anticoagulants: Secondary | ICD-10-CM | POA: Diagnosis not present

## 2016-07-06 DIAGNOSIS — I82402 Acute embolism and thrombosis of unspecified deep veins of left lower extremity: Secondary | ICD-10-CM | POA: Diagnosis not present

## 2016-07-06 DIAGNOSIS — D62 Acute posthemorrhagic anemia: Secondary | ICD-10-CM | POA: Diagnosis not present

## 2016-07-09 ENCOUNTER — Ambulatory Visit (INDEPENDENT_AMBULATORY_CARE_PROVIDER_SITE_OTHER): Payer: Medicare Other | Admitting: *Deleted

## 2016-07-09 ENCOUNTER — Encounter: Payer: Federal, State, Local not specified - PPO | Admitting: Neurology

## 2016-07-09 DIAGNOSIS — I6340 Cerebral infarction due to embolism of unspecified cerebral artery: Secondary | ICD-10-CM | POA: Diagnosis not present

## 2016-07-10 NOTE — Progress Notes (Signed)
Carelink Summary Report / Loop Recorder 

## 2016-07-16 ENCOUNTER — Ambulatory Visit (INDEPENDENT_AMBULATORY_CARE_PROVIDER_SITE_OTHER): Payer: Self-pay | Admitting: Neurology

## 2016-07-16 ENCOUNTER — Ambulatory Visit (INDEPENDENT_AMBULATORY_CARE_PROVIDER_SITE_OTHER): Payer: Medicare Other | Admitting: Neurology

## 2016-07-16 DIAGNOSIS — Z0289 Encounter for other administrative examinations: Secondary | ICD-10-CM

## 2016-07-16 DIAGNOSIS — G629 Polyneuropathy, unspecified: Secondary | ICD-10-CM

## 2016-07-16 DIAGNOSIS — G6289 Other specified polyneuropathies: Secondary | ICD-10-CM | POA: Diagnosis not present

## 2016-07-16 DIAGNOSIS — G5622 Lesion of ulnar nerve, left upper limb: Secondary | ICD-10-CM

## 2016-07-16 NOTE — Progress Notes (Signed)
See procedure note

## 2016-07-16 NOTE — Progress Notes (Signed)
  [name]    Provider:  Dr Jaynee Eagles Referring Provider: Antony Blackbird, MD Primary Care Physician:  Antony Blackbird, MD  History:  [name] [name] is a 66 y.o. female here for emg/ncs of the  left arm and left leg for her neuropathy/numbness in the feet and hand. Risk factors for neuropathy include B12 deficiency and diabetes.  Nerve conduction studies were performed on the left upper and bilateral lower extremities:  The left Median(APB) motor nerve showed delayed distal onset latency (4.46m, N< 4.4) with decreased amplitude (1.4730m N>3) The left Ulnar(ADM) motor nerve showed delayed distal onset latency (4.30m55mN< 3.3) The left Peroneal(EDB) motor nerve showed decreased amplitude(1mV63m>2) with normal F-wave latency The right Peroneal(EDB) motor nerve showed delayed distal onset latency (6.6ms,32m 6.5) and decreased amplitude (1.7mv, 430m2) with normal F-wave latency The left Tibial(AH) motor nerve showed  delayed distal onset latency (6.3ms, N33m.8) with normal F-wave latency The right Tibial(AH) motor nerve showed decreased amplitude (3.3mV, N>11mwith normal F-wave latency The left second-digit orthodromic Median sensory nerve showed delayed distal peak latency(3.6ms, N<339m The left fifth-digit Ulnar orthodromic sensory nerve showed delayed distal peak latency(3.3ms, N<3.21mand decreased amplitude (4uv, N>5) The left radial nerve was within normal limits The bilateral Sural sensory nerves showed no response The bilateral Superficial Peroneal sensory nerves showed no response  EMG Needle study was performed on selected left upper and left lower extremity muscles:   The Deltoid, Triceps, Pronator Teres, Flexor digitorum profundus(ulnar), First Dorsal Interosseous, Opponens Pollicis, Vastus Medialis, Anterior Tibialis, Medial Gastrocnemius, Extensor Hallucis Longus, Abductor Hallucis muscles were within normal limits.   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 Delayed latencies of the median and ulnar nerves with decreased amplitudes may be due to median or ulnar neuropathy but may also be due to polyneuropathy, clinical correlation recommended.  [name] [name] [facility] [address] [phone]

## 2016-07-20 ENCOUNTER — Encounter: Payer: Self-pay | Admitting: Neurology

## 2016-07-20 ENCOUNTER — Ambulatory Visit (INDEPENDENT_AMBULATORY_CARE_PROVIDER_SITE_OTHER): Payer: Medicare Other | Admitting: Neurology

## 2016-07-20 VITALS — BP 142/79 | HR 71 | Ht 63.0 in | Wt 249.8 lb

## 2016-07-20 DIAGNOSIS — R2689 Other abnormalities of gait and mobility: Secondary | ICD-10-CM | POA: Diagnosis not present

## 2016-07-20 DIAGNOSIS — E5111 Dry beriberi: Secondary | ICD-10-CM | POA: Diagnosis not present

## 2016-07-20 DIAGNOSIS — I639 Cerebral infarction, unspecified: Secondary | ICD-10-CM | POA: Diagnosis not present

## 2016-07-20 DIAGNOSIS — G63 Polyneuropathy in diseases classified elsewhere: Secondary | ICD-10-CM

## 2016-07-20 DIAGNOSIS — E531 Pyridoxine deficiency: Secondary | ICD-10-CM | POA: Diagnosis not present

## 2016-07-20 DIAGNOSIS — G629 Polyneuropathy, unspecified: Secondary | ICD-10-CM | POA: Diagnosis not present

## 2016-07-20 MED ORDER — GABAPENTIN 300 MG PO CAPS: 300.0000 mg | ORAL_CAPSULE | Freq: Every day | ORAL | 11 refills | Status: DC

## 2016-07-20 NOTE — Progress Notes (Signed)
[name]    Provider:  Dr Jaynee Eagles Referring Provider: Antony Blackbird, MD Primary Care Physician:  Antony Blackbird, MD   CC: Stroke, Cavernous sinus mass  Interval history 07/20/2016: She has had neuropathy for years.She has had neuropathy for over 15 years or longer. It started in the feet. Numbness, burning in the feet, they feel cold to the touvh but she feels them burning, she has pain in the feet. She tried Lyrica possible in a neuropathy drug trial and she believe she had side effects. Worse at night in bed. Cramping in the feet.   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 She does not know how long she had B12 deficiency. Her last B12 was normal 843. She takes oral supplements. Also HgbA1c 6.0. TSH 2.070. She also has a history of diabetes which improved after bariatric surgery but still elevated 6.0. HgbA1c was as high as 7.7 in the past.   Interval history 06/09/2016: Since being seen patient has had another stroke. She was admitted 04/10/2016. She had newly identified clot by LE by doppler but TCD bubble study was negative. She was discharged on Xarelto. Loop recorder was placed. She presented to the ED with slurred speech. Most symptoms have resolved. She cannot walk very well, her legs and feet are very numb. She has numbness in the feet and legs. She has imbalance. And she has had a history of B12 deficiency. She still has the mouth numbness and right hand sensory changes from previous stroke but the aphasia is resolved.   Ct Head Wo Contrast 04/08/2016 1. 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 2. New focal low-density in the left posterior frontal periventricular white matter, potentially a subacute small vessel infarct. Extensive chronic small vessel ischemic changes in the periventricular white matter are otherwise stable. No evidence of acute cortical stroke or  hemorrhage.   Mr Brain Wo Contrast 04/08/2016 1. Confirmation of acute nonhemorrhagic white matter infarct involving the left corona radiata and posterior centrum semi of ally. 2. Otherwise stable severe periventricular and diffuse subcortical T2 hyperintensities, advanced for age. This represents the sequela of chronic microvascular ischemia. 3. 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   Ct Angio Head & Neck W Or Wo Contrast 04/09/2016 1. Positive for arterial abnormalities associated with the acute white matter ischemia: Severe stenosis at the origin of a left MCA middle sylvian division M2 branch. Moderate irregularity and stenosis in anterior division left M2/ M3 branches. No left MCA M1 stenosis or occlusion. 2. Otherwise carotid bifurcation and ICA siphon atherosclerosis without hemodynamically significant stenosis. 3. Negative posterior circulation. 4. Stable CT appearance of the brain since yesterday. No associated hemorrhage or mass effect. 5. Abnormal CT appearance of the sella turcica and left cavernous sinus corresponding to the chronic invasive pituitary adenoma. 6. No acute findings in the neck.   LE venous dopplers Right lower extremity is negativefor deep vein thrombosis. The left lower extremity is positivefor deep vein thrombosis involving a small segment of the left mid femoral vein.There is no evidence of Baker's cyst bilaterally  TEE Normal TEE. No cardioembolic source.  TCD bubble study - negative for PFO  FVC:BSWHQP A Jarrardis a 65 y.o.femalehere as a follow-up from the hospital after a stroke. She still has numbness in the right fingers and moth due to the left thalamic stroke. We  discussed her strokes, she is due for Cardiac Monitoring and then possibly Loop. Will discuss with our Vascular doctors. Also discussed at length the invasive pituitary tumor shown, I reviewed  imaging with patient and pointed out findingd, discussed next steps including MRI w/wo with pituitary protocol as well as referral to [place].She was not on an aspirin when this happened.   Reviewed notes, labs and imaging from outside physicians, which showed:  IMPRESSION: 1. Acute lacunar infarct in the left thalamus. 2. Possible subacute lacunar infarct in the right splenium of the corpus callosum. 3. Moderate chronic small vessel ischemic disease. 4. Mass involving the sella, left cavernous sinus, and left sphenoid sinus, suspicious for invasive pituitary macroadenoma. Further evaluation with nonemergent pituitary protocol MRI (without and with contrast) is recommended. NEUROSURGERY REVIEWED AND FEELS THIS IS A MASS OF THE SPHENOID SINUS THAT HAS INVADED THE CAVERNOUS SINUS,NOT PITUITARY TUMOR 5. No major intracranial arterial occlusion or significant proximal posterior circulation stenosis. 6. Severe proximal left M2 MCA stenoses.  Review of Systems: Patient complains of symptoms per HPI as well as the following symptoms: No CP, no SOB, no fever, no chills. Pertinent negatives per HPI. All others negative.    Social History   Social History  . Marital status: Single    Spouse name: N/A  . Number of children: 0  . Years of education: college   Occupational History  . Retired    Social History Main Topics  . Smoking status: Former Smoker    Packs/day: 1.00    Years: 40.00    Types: Cigarettes    Quit date: 09/17/2007  . Smokeless tobacco: Never Used  . Alcohol use 0.0 oz/week     Comment: 04/08/2016 "5-6 drinks/year'  . Drug use:     Types: Marijuana     Comment: 04/08/2016 "did some marijuana in college"  . Sexual activity: No   Other Topics Concern  . Not on file   Social History Narrative   Drinks 3 cups of coffee a day     Family History  Problem Relation Age of Onset  . Prostate cancer Father   . Lymphoma Father   . Congestive Heart Failure Maternal  Grandmother   . Lung cancer Maternal Grandfather     Past Medical History:  Diagnosis Date  . Arthritis    "knees, hips, possibly back" (04/08/2016)  . Chronic bronchitis ([place])    "as a child; haven't had it in years" (04/08/2016)  . Chronic lower back pain    spondylosis  . Daily headache    "short ones for the last month or 2" (04/08/2016)  . GERD (gastroesophageal reflux disease)    takes Protonix daily "just to protect my stomach; not for reflux" (04/08/2016)  . Heart murmur    "dx'd by 1 anesthesiologist years and years ago"  . Hyperlipidemia    takes Crestor daily  . Hypertension    takes Valsartan-HCTZ daily  . Joint pain   . Macular degeneration    "just watching"  . Nocturia   . Peripheral edema   . Peripheral neuropathy ([facility])    not on any meds  . Sleep apnea    no CPAP since weight loss after bariatric surgery '12 (04/08/2016)  . Spondylosis   . Stroke ([place]) 04/07/2016   "little numbness in pinky of left hand" (04/08/2016)  . TIA (transient ischemic attack) 01/2016 X 2   Numbness on right mouth/gums/lips; & numbness in 3rd, 4th, & 5th right digits since" (04/08/2016)  .  Type 2 diabetes, diet controlled ([place])   . Urinary frequency   . Urinary incontinence   . Urinary urgency     Past Surgical History:  Procedure Laterality Date  . CATARACT EXTRACTION W/ INTRAOCULAR LENS  IMPLANT, BILATERAL Bilateral ~ 2012  . COLONOSCOPY    . EP IMPLANTABLE DEVICE N/A 04/10/2016   Procedure: Loop Recorder Insertion;  Surgeon: Will Meredith Leeds, MD;  Location: [facility];  Service: Cardiovascular;  Laterality: N/A;  . ESOPHAGOGASTRODUODENOSCOPY     in the 70's  . JOINT REPLACEMENT    . 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 SINUS ENDO W/FUSION  02/28/2016   Procedure: ENDOSCOPIC SINUS SURGERY WITH NAVIGATION;  Surgeon: Ruby Cola, MD;  Location: [facility];  Service: ENT;;  . TEE WITHOUT CARDIOVERSION N/A 04/10/2016   Procedure: TRANSESOPHAGEAL ECHOCARDIOGRAM (TEE) POSSIBLE  LOOP;  Surgeon: Sanda Klein, MD;  Location: [facility];  Service: Cardiovascular;  Laterality: N/A;  . TONSILLECTOMY  1957  . TOTAL HIP ARTHROPLASTY Right 2008  . TOTAL HIP REVISION Right 03/30/2016   Procedure: RIGHT TOTAL HIP REVISION;  Surgeon: Frederik Pear, MD;  Location: [facility];  Service: Orthopedics;  Laterality: Right;  . TOTAL KNEE ARTHROPLASTY Bilateral 2001  . VAGINAL HYSTERECTOMY     "left my ovaries"    Current Outpatient Prescriptions  Medication Sig Dispense Refill  . b complex vitamins capsule Take 1 capsule by mouth daily. 30 capsule 2  . beta carotene w/minerals (OCUVITE) tablet Take 1 tablet by mouth daily.    . Coenzyme Q10 (CO Q-10) 400 MG CAPS Take by mouth.    [name] FLUZONE HIGH-DOSE 0.5 ML SUSY     . iron polysaccharides (NIFEREX) 150 MG capsule Take 1 capsule (150 mg total) by mouth 2 (two) times daily. 60 capsule 2  . magnesium oxide (MAG-OX) 400 MG tablet Take 400 mg by mouth at bedtime.     [name] PREVNAR 13 SUSP injection     . rosuvastatin (CRESTOR) 20 MG tablet Take 1 tablet (20 mg total) by mouth daily. (Patient taking differently: Take 20 mg by mouth at bedtime. ) 30 tablet 0  . XARELTO 20 MG TABS tablet Take 20 mg by mouth daily.     No current facility-administered medications for this visit.     Allergies as of 07/20/2016 - Review Complete 07/20/2016  Allergen Reaction Noted  . Ambien [zolpidem] Other (See Comments) 03/27/2016  . Atorvastatin Other (See Comments) 02/24/2016  . Codeine Nausea And Vomiting 05/29/2011  . Penicillins Itching and Other (See Comments) 05/29/2011  . Simvastatin Other (See Comments) 03/27/2016  . Byetta 10 mcg pen [exenatide] Nausea Only 03/27/2016    Vitals: BP (!) 142/79 (BP Location: Right Arm, Patient Position: Sitting, Cuff Size: Normal)   Pulse 71   Ht _0  (1.6 m)   Wt 249 lb 12.8 oz (113.3 kg)   BMI 44.25 kg/m  Last Weight:  Wt Readings from Last 1 Encounters:  07/20/16 249 lb 12.8 oz (113.3 kg)   Last  Height:   Ht Readings from Last 1 Encounters:  07/20/16 _1  (1.6 m)     Physical exam: Exam: Gen: NAD, conversant, well nourised, obese, well groomed  CV: RRR, no MRG. No Carotid Bruits. No peripheral edema, warm, nontender Eyes: Conjunctivae clear without exudates or hemorrhage  Neuro: Detailed Neurologic Exam  Speech: Speech is normal; fluent and spontaneous with normal comprehension.  Cognition: The patient is oriented to person, place, and time;  recent and remote memory  intact;  language fluent;  normal attention, concentration,  fund of knowledge Cranial Nerves: The pupils are equal, round, and reactive to light. The fundi are normal and spontaneous venous pulsations are present. Visual fields are full to finger confrontation. Extraocular movements are intact. Trigeminal sensation is intact and the muscles of mastication are normal. The face is symmetric. The palate elevates in the midline. Hearing intact. Voice is normal. Shoulder shrug is normal. The tongue has normal motion without fasciculations.   Coordination: Normal finger to nose and heel to shin. Normal rapid alternating movements.   Gait: Heel-toe and tandem gait are normal.   Motor Observation: No asymmetry, no atrophy, and no involuntary movements noted. Tone: Normal muscle tone.   Posture: Posture is normal. normal erect  Strength: Strength is V/V in the upper and lower limbs.   Sensation: Decreased pin prick to the knees, absent vibration to the ankles, intact propriceotpion.  Reflex Exam:  DTR's: Absent AJs  Toes: The toes are downgoing bilaterally.  Clonus: Clonus is absent.  Assessment and Plan: Ms.[place] A Jarrardis a 64 y.o.femalewith history of HTN, HLD,TIA/stroke, diet controlled DM type 2, peripheral neuropathy,s/p gastric bypass. . L corona radiata/posterior centrum semiovale infarct, etiology not  clear. She had left thalamic and right splenium punctate infarcts in 01/2016, suspicious for cardioembolic infarcts. Pt does have severe L M2 inferior division stenosis and recent hypotension, but the distribution of M2 inferior does not fit into these infarcts. TEE normal and loop placed. Found to have DVT likely due to recent hip surgery but TCD bubble study no PFO.    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 Risk factors include b12 deficiency and diabetes. Will test for other causes of neuropathy. neurontin at night.Alpha-lipoic acid daily. Discussed side effects as per patient instructions.  - Loop recorder placed 7/21 - TCD bubble negative for PF -  LDL53,  HgbA1c 6.0 - Recommend anticoagulation for 6-12 months - BP goal 130-150 due to left M2 severe stenosis - LDL 53, goal < 70, Continue statin at discharge - HgbA1c 6.0, goal < 7.0 - Hx OSA, no CPAP since bariatric surgery in 2012  Cc; Dr. Vanetta Shawl, [facility] [address] [place], [place] [address]  Phone [phone] Fax [phone]  A total of 30 minutes was spent face-to-face with this patient. Over half this time was spent on counseling patient on the embolic stroke, neuropathy diagnosis and different diagnostic and therapeutic options available.

## 2016-07-20 NOTE — Patient Instructions (Addendum)
Remember to drink plenty of fluid, eat healthy meals and do not skip any meals. Try to eat protein with a every meal and eat a healthy snack such as fruit or nuts in between meals. Try to keep a regular sleep-wake schedule and try to exercise daily, particularly in the form of walking, 20-30 minutes a day, if you can.   I would like to see you back in 4-6 months, sooner if we need to. Please call us with any interim questions, concerns, problems, updates or refill requests.   Our phone number is [phone]. We also have an after hours call service for urgent matters and there is a physician on-call for urgent questions. For any emergencies you know to call 911 or go to the nearest emergency room  Alpha lipoic acid 400-638m daily  Gabapentin capsules or tablets What is this medicine? GABAPENTIN (GA ba pen tin) is used to control partial seizures in adults with epilepsy. It is also used to treat certain types of nerve pain. This medicine may be used for other purposes; ask your health care provider or pharmacist if you have questions. What should I tell my health care provider before I take this medicine? They need to know if you have any of these conditions: -kidney disease -suicidal thoughts, plans, or attempt; a previous suicide attempt by you or a family member -an unusual or allergic reaction to gabapentin, other medicines, foods, dyes, or preservatives -pregnant or trying to get pregnant -breast-feeding How should I use this medicine? Take this medicine by mouth with a glass of water. Follow the directions on the prescription label. You can take it with or without food. If it upsets your stomach, take it with food.Take your medicine at regular intervals. Do not take it more often than directed. Do not stop taking except on your doctor's advice. If you are directed to break the 600 or 800 mg tablets in half as part of your dose, the extra half tablet should be used for the next dose. If  you have not used the extra half tablet within 28 days, it should be thrown away. A special MedGuide will be given to you by the pharmacist with each prescription and refill. Be sure to read this information carefully each time. Talk to your pediatrician regarding the use of this medicine in children. Special care may be needed. Overdosage: If you think you have taken too much of this medicine contact a poison control center or emergency room at once. NOTE: This medicine is only for you. Do not share this medicine with others. What if I miss a dose? If you miss a dose, take it as soon as you can. If it is almost time for your next dose, take only that dose. Do not take double or extra doses. What may interact with this medicine? Do not take this medicine with any of the following medications: -other gabapentin products This medicine may also interact with the following medications: -alcohol -antacids -antihistamines for allergy, cough and cold -certain medicines for anxiety or sleep -certain medicines for depression or psychotic disturbances -homatropine; hydrocodone -naproxen -narcotic medicines (opiates) for pain -phenothiazines like chlorpromazine, mesoridazine, prochlorperazine, thioridazine This list may not describe all possible interactions. Give your health care provider a list of all the medicines, herbs, non-prescription drugs, or dietary supplements you use. Also tell them if you smoke, drink alcohol, or use illegal drugs. Some items may interact with your medicine. What should I watch for while using this medicine? Visit  your doctor or health care professional for regular checks on your progress. You may want to keep a record at home of how you feel your condition is responding to treatment. You may want to share this information with your doctor or health care professional at each visit. You should contact your doctor or health care professional if your seizures get worse or if you  have any new types of seizures. Do not stop taking this medicine or any of your seizure medicines unless instructed by your doctor or health care professional. Stopping your medicine suddenly can increase your seizures or their severity. Wear a medical identification bracelet or chain if you are taking this medicine for seizures, and carry a card that lists all your medications. You may get drowsy, dizzy, or have blurred vision. Do not drive, use machinery, or do anything that needs mental alertness until you know how this medicine affects you. To reduce dizzy or fainting spells, do not sit or stand up quickly, especially if you are an older patient. Alcohol can increase drowsiness and dizziness. Avoid alcoholic drinks. Your mouth may get dry. Chewing sugarless gum or sucking hard candy, and drinking plenty of water will help. The use of this medicine may increase the chance of suicidal thoughts or actions. Pay special attention to how you are responding while on this medicine. Any worsening of mood, or thoughts of suicide or dying should be reported to your health care professional right away. Women who become pregnant while using this medicine may enroll in the [place] Pregnancy Registry by calling [id number]. This registry collects information about the safety of antiepileptic drug use during pregnancy. What side effects may I notice from receiving this medicine? Side effects that you should report to your doctor or health care professional as soon as possible: -allergic reactions like skin rash, itching or hives, swelling of the face, lips, or tongue -worsening of mood, thoughts or actions of suicide or dying Side effects that usually do not require medical attention (report to your doctor or health care professional if they continue or are bothersome): -constipation -difficulty walking or controlling muscle movements -dizziness -nausea -slurred  speech -tiredness -tremors -weight gain This list may not describe all possible side effects. Call your doctor for medical advice about side effects. You may report side effects to FDA at 1-800-FDA-1088. Where should I keep my medicine? Keep out of reach of children. This medicine may cause accidental overdose and death if it taken by other adults, children, or pets. Mix any unused medicine with a substance like cat litter or coffee grounds. Then throw the medicine away in a sealed container like a sealed bag or a coffee can with a lid. Do not use the medicine after the expiration date. Store at room temperature between 15 and 30 degrees C (59 and 86 degrees F). NOTE: This sheet is a summary. It may not cover all possible information. If you have questions about this medicine, talk to your doctor, pharmacist, or health care provider.    2016, Elsevier/Gold Standard. (2013-11-03 15:26:50)

## 2016-07-22 ENCOUNTER — Telehealth (HOSPITAL_COMMUNITY): Payer: Self-pay

## 2016-07-22 NOTE — Telephone Encounter (Signed)
This patient is overdue for recommended follow-up with a bariatric surgeon at [facility]. A letter was mailed to the address on file 06/24/16 from both [facility] in attempt to reestablish post-op care. The letter included a patient survey which was returned to the [facility].  Patient declined an appointment advising she has achieved goals & does not see need for regular visits. She concluded that she would contact [facility] to be seen "if she has a problem related to bariatric surgery." Copy of the survey was shared with Mallory Shirk at [facility] so she may file in patients office chart.

## 2016-07-23 ENCOUNTER — Other Ambulatory Visit: Payer: Self-pay | Admitting: Neurology

## 2016-07-23 DIAGNOSIS — R768 Other specified abnormal immunological findings in serum: Secondary | ICD-10-CM

## 2016-07-23 NOTE — Progress Notes (Signed)
cyclic

## 2016-07-24 ENCOUNTER — Telehealth: Payer: Self-pay | Admitting: *Deleted

## 2016-07-24 NOTE — Telephone Encounter (Signed)
-----  Message from Melvenia Beam, MD sent at 07/23/2016  5:12 PM EDT ----- All labs normal except for two: her rheumatoid factor was elevated. I suspect this is a false positive because her ANA was negative but I am going to order a confirmatory test. Also her B6 was high, she must be taking a supplement. You can take too much B6 and it can cause toxicity and neuropathy, I recommend people take less than 148m a day in a supplement. thanks

## 2016-07-24 NOTE — Telephone Encounter (Signed)
Called and spoke to pt about lab results per AA, MD note. She verbalized understanding. She stated she does take a b-complex vitamin. Advised her to check bottle and make sure that she is not taking more than 152m/day per AA, MD .   She will come next week for further lab test.

## 2016-07-26 NOTE — Procedures (Signed)
[name]    Provider:  Dr Jaynee Eagles Referring Provider: Antony Blackbird, MD Primary Care Physician:  Antony Blackbird, MD  History:  [name] [name] is a 65 y.o. female here for emg/ncs of the  left arm and left leg for her neuropathy/numbness in the feet and hand. Risk factors for neuropathy include B12 deficiency and diabetes.  Nerve conduction studies were performed on the left upper and bilateral lower extremities:  The left Median(APB) motor nerve showed delayed distal onset latency (4.33m, N< 4.4) with decreased amplitude (1.4108m N>3) The left Ulnar(ADM) motor nerve showed delayed distal onset latency (4.52m64mN< 3.3) The left Peroneal(EDB) motor nerve showed decreased amplitude(1mV51m>2) with normal F-wave latency The right Peroneal(EDB) motor nerve showed delayed distal onset latency (6.6ms,87m 6.5) and decreased amplitude (1.7mv, 94m2) with normal F-wave latency The left Tibial(AH) motor nerve showed  delayed distal onset latency (6.3ms, N63m.8) with normal F-wave latency The right Tibial(AH) motor nerve showed decreased amplitude (3.3mV, N>45mwith normal F-wave latency The left second-digit orthodromic Median sensory nerve showed delayed distal peak latency(3.6ms, N<371m The left fifth-digit Ulnar orthodromic sensory nerve showed delayed distal peak latency(3.3ms, N<3.352mand decreased amplitude (4uv, N>5) The left radial nerve was within normal limits The bilateral Sural sensory nerves showed no response The bilateral Superficial Peroneal sensory nerves showed no response  EMG Needle study was performed on selected left upper and left lower extremity muscles:   The Deltoid, Triceps, Pronator Teres, Flexor digitorum profundus(ulnar), First Dorsal Interosseous, Opponens Pollicis, Vastus Medialis, Anterior Tibialis, Medial Gastrocnemius, Extensor Hallucis Longus, Abductor Hallucis muscles were within normal limits.   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 Delayed latencies of the median and ulnar nerves with decreased amplitudes may be due to median or ulnar neuropathy but may also be due to polyneuropathy, clinical correlation recommended.  Vasti Yagi [name] [address] [phone]

## 2016-07-27 ENCOUNTER — Other Ambulatory Visit (INDEPENDENT_AMBULATORY_CARE_PROVIDER_SITE_OTHER): Payer: Self-pay

## 2016-07-27 DIAGNOSIS — Z0289 Encounter for other administrative examinations: Secondary | ICD-10-CM

## 2016-07-27 DIAGNOSIS — R768 Other specified abnormal immunological findings in serum: Secondary | ICD-10-CM

## 2016-07-28 LAB — MULTIPLE MYELOMA PANEL, SERUM
ALBUMIN/GLOB SERPL: 1.3 (ref 0.7–1.7)
Albumin SerPl Elph-Mcnc: 3.6 g/dL (ref 2.9–4.4)
Alpha 1: 0.3 g/dL (ref 0.0–0.4)
Alpha2 Glob SerPl Elph-Mcnc: 0.8 g/dL (ref 0.4–1.0)
B-Globulin SerPl Elph-Mcnc: 1 g/dL (ref 0.7–1.3)
Gamma Glob SerPl Elph-Mcnc: 0.8 g/dL (ref 0.4–1.8)
Globulin, Total: 3 g/dL (ref 2.2–3.9)
IgA/Immunoglobulin A, Serum: 200 mg/dL (ref 87–352)
IgG (Immunoglobin G), Serum: 723 mg/dL (ref 700–1600)
IgM (Immunoglobulin M), Srm: 128 mg/dL (ref 26–217)
TOTAL PROTEIN: 6.6 g/dL (ref 6.0–8.5)

## 2016-07-28 LAB — GLIADIN ANTIBODIES, SERUM
Antigliadin Abs, IgA: 3 units (ref 0–19)
GLIADIN IGG: 1 U (ref 0–19)

## 2016-07-28 LAB — SJOGREN'S SYNDROME ANTIBODS(SSA + SSB)

## 2016-07-28 LAB — HEAVY METALS, BLOOD
ARSENIC: 15 ug/L (ref 2–23)
Lead, Blood: NOT DETECTED ug/dL (ref 0–19)
MERCURY: NOT DETECTED ug/L (ref 0.0–14.9)

## 2016-07-28 LAB — HEPATITIS C ANTIBODY

## 2016-07-28 LAB — TISSUE TRANSGLUTAMINASE, IGA: Transglutaminase IgA: <2 U/mL (ref 0–3)

## 2016-07-28 LAB — VITAMIN B1: THIAMINE: 235.3 nmol/L — AB (ref 66.5–200.0)

## 2016-07-28 LAB — RPR: RPR: NONREACTIVE

## 2016-07-28 LAB — ANA W/REFLEX: ANA: NEGATIVE

## 2016-07-28 LAB — RHEUMATOID FACTOR: Rhuematoid fact SerPl-aCnc: 35.4 IU/mL — ABNORMAL HIGH (ref 0.0–13.9)

## 2016-07-28 LAB — SEDIMENTATION RATE: SED RATE: 28 mm/h (ref 0–40)

## 2016-07-28 LAB — VITAMIN B6: VITAMIN B6: 59.3 ug/L — AB (ref 2.0–32.8)

## 2016-07-29 ENCOUNTER — Telehealth: Payer: Self-pay | Admitting: *Deleted

## 2016-07-29 LAB — CYCLIC CITRUL PEPTIDE ANTIBODY, IGG/IGA: CYCLIC CITRULLIN PEPTIDE AB: 3 U (ref 0–19)

## 2016-07-29 NOTE — Telephone Encounter (Signed)
-----  Message from Melvenia Beam, MD sent at 07/29/2016  9:41 AM EST ----- Lab normal, elevated RF false positive thanks

## 2016-07-29 NOTE — Telephone Encounter (Signed)
LVM for pt about lab results per AA,MD note. Gave [facility] phone number if she has further questions.

## 2016-08-03 ENCOUNTER — Ambulatory Visit: Payer: Medicare Other | Attending: Neurology | Admitting: *Deleted

## 2016-08-03 DIAGNOSIS — R2689 Other abnormalities of gait and mobility: Secondary | ICD-10-CM | POA: Diagnosis not present

## 2016-08-03 DIAGNOSIS — R2681 Unsteadiness on feet: Secondary | ICD-10-CM | POA: Diagnosis not present

## 2016-08-03 DIAGNOSIS — M6281 Muscle weakness (generalized): Secondary | ICD-10-CM | POA: Insufficient documentation

## 2016-08-03 NOTE — Patient Instructions (Signed)
Patient performed sidelying clamshells for R hip strength.  Discussed performing for initial HEP.

## 2016-08-03 NOTE — Therapy (Signed)
[facility] [address] [place], [place], [address] Phone: [phone]   Fax:  [phone]  Physical Therapy Evaluation  Patient Details  Name: [name] [name] MRN: [id number] Date of Birth: November 22, 1950 Referring Provider: Jaynee Eagles  Encounter Date: 08/03/2016      PT End of Session - 08/03/16 1325    Visit Number 1   Number of Visits 13   Date for PT Re-Evaluation 09/25/16   Authorization Type Medicare, G-Code every 10th visit   PT Start Time 1102   PT Stop Time 1147   PT Time Calculation (min) 45 min   Activity Tolerance Patient tolerated treatment well   Behavior During Therapy [facility] for tasks assessed/performed      Past Medical History:  Diagnosis Date  . Arthritis    "knees, hips, possibly back" (04/08/2016)  . Chronic bronchitis ([place])    "as a child; haven't had it in years" (04/08/2016)  . Chronic lower back pain    spondylosis  . Daily headache    "short ones for the last month or 2" (04/08/2016)  . GERD (gastroesophageal reflux disease)    takes Protonix daily "just to protect my stomach; not for reflux" (04/08/2016)  . Heart murmur    "dx'd by 1 anesthesiologist years and years ago"  . Hyperlipidemia    takes Crestor daily  . Hypertension    takes Valsartan-HCTZ daily  . Joint pain   . Macular degeneration    "just watching"  . Nocturia   . Peripheral edema   . Peripheral neuropathy (HCC)    not on any meds  . Sleep apnea    no CPAP since weight loss after bariatric surgery '12 (04/08/2016)  . Spondylosis   . Stroke ([place]) 04/07/2016   "little numbness in pinky of left hand" (04/08/2016)  . TIA (transient ischemic attack) 01/2016 X 2   Numbness on right mouth/gums/lips; & numbness in 3rd, 4th, & 5th right digits since" (04/08/2016)  . Type 2 diabetes, diet controlled ([place])   . Urinary frequency   . Urinary incontinence   . Urinary urgency     Past Surgical History:  Procedure Laterality Date   . CATARACT EXTRACTION W/ INTRAOCULAR LENS  IMPLANT, BILATERAL Bilateral ~ 2012  . COLONOSCOPY    . EP IMPLANTABLE DEVICE N/A 04/10/2016   Procedure: Loop Recorder Insertion;  Surgeon: Will Meredith Leeds, MD;  Location: [facility];  Service: Cardiovascular;  Laterality: N/A;  . ESOPHAGOGASTRODUODENOSCOPY     in the 70's  . JOINT REPLACEMENT    . 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 SINUS ENDO W/FUSION  02/28/2016   Procedure: ENDOSCOPIC SINUS SURGERY WITH NAVIGATION;  Surgeon: Ruby Cola, MD;  Location: [facility];  Service: ENT;;  . TEE WITHOUT CARDIOVERSION N/A 04/10/2016   Procedure: TRANSESOPHAGEAL ECHOCARDIOGRAM (TEE) POSSIBLE LOOP;  Surgeon: Sanda Klein, MD;  Location: [facility];  Service: Cardiovascular;  Laterality: N/A;  . TONSILLECTOMY  1957  . TOTAL HIP ARTHROPLASTY Right 2008  . TOTAL HIP REVISION Right 03/30/2016   Procedure: RIGHT TOTAL HIP REVISION;  Surgeon: Frederik Pear, MD;  Location: [facility];  Service: Orthopedics;  Laterality: Right;  . TOTAL KNEE ARTHROPLASTY Bilateral 2001  . VAGINAL HYSTERECTOMY     "left my ovaries"    There were no vitals filed for this visit.       Subjective Assessment - 08/03/16 1024    Subjective Reports feeling very unsteady, but not currently walking with cane.  Feels  uncomfortable with curbs and unlevel ground.     Pertinent History Patient reports history of neuopathy and three recent strokes.  Not sure if the strokes are impacting mobility as much as neuropathy and hip revision surgery in July.  Had stroke week after surgery, then had hematoma of hip two weeks after surgery which stopped therapy.   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   Limitations Walking   Patient Stated Goals To improve balance, feel more confident walking in community.   Currently in Pain? No/denies            [facility] PT Assessment - 08/03/16 0001      Assessment   Medical Diagnosis Neuropathy, imbalance   Referring Provider A[place]   Onset  Date/Surgical Date 03/30/16   Hand Dominance Right   Next MD Visit 6 months   Prior Therapy No     Precautions   Precautions Posterior Hip   Precaution Booklet Issued No   Precaution Comments recalls all precautions, does occasionally bend past 90 degrees and rolls leg inward, no issues with dislocation     Restrictions   Weight Bearing Restrictions No     Balance Screen   Has the patient fallen in the past 6 months Yes   How many times? 2  one in garage and one into closet   Has the patient had a decrease in activity level because of a fear of falling?  Yes   Is the patient reluctant to leave their home because of a fear of falling?  No     Home Environment   Living Environment Private residence   Living Arrangements Alone   Available Help at Discharge Family  mother and aunt live close   Type of [place] to enter   Entrance Stairs-Number of Steps 1   [place] One level   [place] - single point;Walker - 2 wheels;Grab bars - tub/shower;Hand held shower head;Shower seat  comfort height toilet     Prior Function   Level of Independence Independent with basic ADLs;Independent with household mobility without device;Independent with community mobility without device;Independent with homemaking with ambulation   Vocation Retired   Leisure reading, watching TV     Cognition   Overall Cognitive Status Within Functional Limits for tasks assessed  sometimes difficulty with word finding and little dysarthria     Sensation   Light Touch Impaired by gross assessment   Additional Comments decreased to light touch bilateral feet and lower legs     ROM / Strength   AROM / PROM / Strength Strength     Strength   Overall Strength Deficits   Overall Strength Comments R hip flexion 3+/5, knee extension 4-/5, ankle DF 5/5; L hip flexion 4/5, knee extension 4+/5, ankle DF 5/5     Transfers   Transfers Sit to Stand    Sit to Stand 7: Independent   Five time sit to stand comments  18.3 sec     Ambulation/Gait   Ambulation/Gait Yes   Ambulation/Gait Assistance 6: Modified independent (Device/Increase time)   Ambulation Distance (Feet) 150 Feet   Assistive device None   Gait Pattern Step-through pattern;Trendelenburg;Wide base of support;Abducted- right;Lateral hip instability   Ambulation Surface Level;Indoor   Gait velocity 0.93 m/s   Stairs Yes   Stairs Assistance 6: Modified independent (Device/Increase time)   Stair Management Technique Two rails;Step to pattern;Alternating pattern;Forwards  step to pattern  to ascend, step through to descend   Number of Stairs 4   Height of Stairs 6     Balance   Balance Assessed Yes     Standardized Balance Assessment   Standardized Balance Assessment Berg Balance Test;Five Times Sit to Stand;Timed Up and Go Test   Five times sit to stand comments  18.3 sec     Berg Balance Test   Sit to Stand Able to stand without using hands and stabilize independently   Standing Unsupported Able to stand safely 2 minutes   Sitting with Back Unsupported but Feet Supported on Floor or Stool Able to sit safely and securely 2 minutes   Stand to Sit Sits safely with minimal use of hands   Transfers Able to transfer safely, minor use of hands   Standing Unsupported with Eyes Closed Able to stand 10 seconds safely   Standing Ubsupported with Feet Together Able to place feet together independently and stand 1 minute safely   From Standing, Reach Forward with Outstretched Arm Can reach confidently >25 cm (10")   From Standing Position, Pick up Object from Floor Able to pick up shoe safely and easily   From Standing Position, Turn to Look Behind Over each Shoulder Looks behind from both sides and weight shifts well   Turn 360 Degrees Able to turn 360 degrees safely but slowly   Standing Unsupported, Alternately Place Feet on Step/Stool Able to complete >2 steps/needs minimal  assist   Standing Unsupported, One Foot in Front Able to take small step independently and hold 30 seconds   Standing on One Leg Tries to lift leg/unable to hold 3 seconds but remains standing independently   Total Score 46     Timed Up and Go Test   Normal TUG (seconds) 9.7     Functional Gait  Assessment   Gait assessed  No                           PT Education - 08/03/16 1345    Education provided Yes   Education Details PT POC, Sidelying clamshell for R hip strength    Person(s) Educated Patient   Methods Explanation;Demonstration   Comprehension Verbalized understanding             PT Long Term Goals - 08/03/16 1330      PT LONG TERM GOAL #1   Title Patient will demonstrate decreased fall risk with Berg score at least 50/56   Baseline target 09/25/16   Time 6   Period Weeks   Status New     PT LONG TERM GOAL #2   Title Patient to demonstrate gait on uneven surfaces including curbs mod I with cane.    Baseline target 09/25/16   Time 6   Period Weeks   Status New     PT LONG TERM GOAL #3   Title Patient to be independent with HEP for strength, balance and conditioning (walking program).   Baseline target 09/25/16   Time 6   Period Weeks   Status New     PT LONG TERM GOAL #4   Title Patient to demonstrate gait with improved efficiency via speed at 1.1 m/s or greater.    Baseline target 09/25/16   Time 6   Period Weeks   Status New     PT LONG TERM GOAL #5   Title Patient to verbalize method for fall recovery that improves safety with regards  to hip precautions   Baseline target 09/25/16   Time 6   Period Weeks   Status New               Plan - 08/03/16 1326    Clinical Impression Statement Patient presents with decreased strength and stability R hip with history of hip replacement and revision as well as history of peripheral neuropathy and multiple CVA's that have affected balance.  She reports 2 falls since surgery in July and  has reported decreased confidence with community ambulation.  She will benefit from skilled PT to improve balance and gait for improved function and community ambulation.    Rehab Potential Good   PT Frequency 2x / week   PT Duration 6 weeks   PT Treatment/Interventions Patient/family education;DME Instruction;ADLs/Self Care Home Management;Gait training;Stair training;Functional mobility training;Therapeutic activities;Therapeutic exercise;Balance training;Neuromuscular re-education;Scar mobilization;Taping   PT Next Visit Plan Initiate HEP for R hip strength, work on gait with cane esp curb/unlevel ground.   Consulted and Agree with Plan of Care Patient      Patient will benefit from skilled therapeutic intervention in order to improve the following deficits and impairments:  Abnormal gait, Decreased activity tolerance, Decreased balance, Decreased mobility, Decreased endurance, Decreased knowledge of use of DME, Decreased strength, Decreased safety awareness  Visit Diagnosis: Other abnormalities of gait and mobility - Plan: PT plan of care cert/re-cert  Unsteadiness on feet - Plan: PT plan of care cert/re-cert  Muscle weakness (generalized) - Plan: PT plan of care cert/re-cert      G-Codes - 21/74/71 1338    Functional Assessment Tool Used Berg 46/56, TUG 9.7, Gait velocity 0.93 m/s   Functional Limitation Mobility: Walking and moving around   Mobility: Walking and Moving Around Current Status [phone]) At least 20 percent but less than 40 percent impaired, limited or restricted   Mobility: Walking and Moving Around Goal Status [phone]) At least 1 percent but less than 20 percent impaired, limited or restricted       Problem List Patient Active Problem List   Diagnosis Date Noted  . Peripheral polyneuropathy ([place]) 07/20/2016  . DVT of deep femoral vein ([place]) 04/10/2016  . Cerebrovascular accident (CVA) due to embolism of cerebral artery ([place])   . Expressive aphasia 04/09/2016  .  Anemia 04/09/2016  . Hypocalcemia 04/09/2016  . CVA (cerebral infarction) 04/08/2016  . 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 Thalamic infarct, acute ([place]) 02/01/2016  . 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 Stroke ([place]) 01/31/2016  . Stroke-like symptoms 01/31/2016  . Hyperglycemia 01/31/2016  . Diabetes mellitus without complication ([place])   . Hypertension   . Obesity     [name] [name] 08/03/2016, 1:46 PM  [name] [name], [place] 08/03/2016   [facility] [address] [place], [place], [address] Phone: [phone]   Fax:  [phone]  Name: [name] [name] MRN: [id number] Date of Birth: June 13, 1951

## 2016-08-07 ENCOUNTER — Encounter: Payer: Self-pay | Admitting: Physical Therapy

## 2016-08-07 ENCOUNTER — Ambulatory Visit: Payer: Medicare Other | Admitting: Physical Therapy

## 2016-08-07 DIAGNOSIS — R2689 Other abnormalities of gait and mobility: Secondary | ICD-10-CM | POA: Diagnosis not present

## 2016-08-07 DIAGNOSIS — M6281 Muscle weakness (generalized): Secondary | ICD-10-CM | POA: Diagnosis not present

## 2016-08-07 DIAGNOSIS — R2681 Unsteadiness on feet: Secondary | ICD-10-CM | POA: Diagnosis not present

## 2016-08-07 NOTE — Therapy (Signed)
[facility] [address] [place], [place], [address] Phone: [phone]   Fax:  [phone]  Physical Therapy Treatment  Patient Details  Name: [name] [name] MRN: [id number] Date of Birth: Jun 24, 1951 Referring Provider: Jaynee Eagles  Encounter Date: 08/07/2016      PT End of Session - 08/07/16 0851    Visit Number 2   Number of Visits 13   Date for PT Re-Evaluation 09/25/16   Authorization Type Medicare, G-Code every 10th visit   PT Start Time 0846   PT Stop Time 0926   PT Time Calculation (min) 40 min   Activity Tolerance Patient tolerated treatment well   Behavior During Therapy [facility] for tasks assessed/performed      Past Medical History:  Diagnosis Date  . Arthritis    "knees, hips, possibly back" (04/08/2016)  . Chronic bronchitis ([place])    "as a child; haven't had it in years" (04/08/2016)  . Chronic lower back pain    spondylosis  . Daily headache    "short ones for the last month or 2" (04/08/2016)  . GERD (gastroesophageal reflux disease)    takes Protonix daily "just to protect my stomach; not for reflux" (04/08/2016)  . Heart murmur    "dx'd by 1 anesthesiologist years and years ago"  . Hyperlipidemia    takes Crestor daily  . Hypertension    takes Valsartan-HCTZ daily  . Joint pain   . Macular degeneration    "just watching"  . Nocturia   . Peripheral edema   . Peripheral neuropathy (HCC)    not on any meds  . Sleep apnea    no CPAP since weight loss after bariatric surgery '12 (04/08/2016)  . Spondylosis   . Stroke ([place]) 04/07/2016   "little numbness in pinky of left hand" (04/08/2016)  . TIA (transient ischemic attack) 01/2016 X 2   Numbness on right mouth/gums/lips; & numbness in 3rd, 4th, & 5th right digits since" (04/08/2016)  . Type 2 diabetes, diet controlled ([place])   . Urinary frequency   . Urinary incontinence   . Urinary urgency     Past Surgical History:  Procedure Laterality Date  .  CATARACT EXTRACTION W/ INTRAOCULAR LENS  IMPLANT, BILATERAL Bilateral ~ 2012  . COLONOSCOPY    . EP IMPLANTABLE DEVICE N/A 04/10/2016   Procedure: Loop Recorder Insertion;  Surgeon: Will Meredith Leeds, MD;  Location: [facility];  Service: Cardiovascular;  Laterality: N/A;  . ESOPHAGOGASTRODUODENOSCOPY     in the 70's  . JOINT REPLACEMENT    . 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 SINUS ENDO W/FUSION  02/28/2016   Procedure: ENDOSCOPIC SINUS SURGERY WITH NAVIGATION;  Surgeon: Ruby Cola, MD;  Location: [facility];  Service: ENT;;  . TEE WITHOUT CARDIOVERSION N/A 04/10/2016   Procedure: TRANSESOPHAGEAL ECHOCARDIOGRAM (TEE) POSSIBLE LOOP;  Surgeon: Sanda Klein, MD;  Location: [facility];  Service: Cardiovascular;  Laterality: N/A;  . TONSILLECTOMY  1957  . TOTAL HIP ARTHROPLASTY Right 2008  . TOTAL HIP REVISION Right 03/30/2016   Procedure: RIGHT TOTAL HIP REVISION;  Surgeon: Frederik Pear, MD;  Location: [facility];  Service: Orthopedics;  Laterality: Right;  . TOTAL KNEE ARTHROPLASTY Bilateral 2001  . VAGINAL HYSTERECTOMY     "left my ovaries"    There were no vitals filed for this visit.      Subjective Assessment - 08/07/16 0850    Subjective "My hip is a little touchy after doing the exercise she sent home".  No falls. No pain, just an "uncomfortable" feeling.   Pertinent History Patient reports history of neuopathy and three recent strokes.  Not sure if the strokes are impacting mobility as much as neuropathy and hip revision surgery in July.  Had stroke week after surgery, then had hematoma of hip two weeks after surgery which stopped therapy.   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   Limitations Walking   Patient Stated Goals To improve balance, feel more confident walking in community.   Currently in Pain? No/denies              [facility] Adult PT Treatment/Exercise - 08/07/16 0911      Knee/Hip Exercises: Aerobic   Recumbent Bike Scifit x 4 extremeties level 1.5 x  8 minutes with goal rpm >/= 60 for strengthening and activity tolerance     Knee/Hip Exercises: Standing   Heel Raises Both;1 set;10 reps;Limitations   Heel Raises Limitations light UE support needed, cues on posture and ex form   Hip Abduction AROM;Stengthening;Both;1 set;10 reps;Knee straight;Limitations   Abduction Limitations alternating legs with UE support needed for balance, cues on form and technique   Functional Squat 1 set;10 reps;Limitations   Functional Squat Limitations mini squats with no UE support x 10 reps, cues on correct form and technique                PT Education - 08/07/16 0909    Education provided Yes   Education Details HEP: for right LE strengthening   Person(s) Educated Patient   Methods Explanation;Demonstration;Verbal cues;Handout   Comprehension Verbalized understanding;Returned demonstration;Verbal cues required;Need further instruction             PT Long Term Goals - 08/03/16 1330      PT LONG TERM GOAL #1   Title Patient will demonstrate decreased fall risk with Berg score at least 50/56   Baseline target 09/25/16   Time 6   Period Weeks   Status New     PT LONG TERM GOAL #2   Title Patient to demonstrate gait on uneven surfaces including curbs mod I with cane.    Baseline target 09/25/16   Time 6   Period Weeks   Status New     PT LONG TERM GOAL #3   Title Patient to be independent with HEP for strength, balance and conditioning (walking program).   Baseline target 09/25/16   Time 6   Period Weeks   Status New     PT LONG TERM GOAL #4   Title Patient to demonstrate gait with improved efficiency via speed at 1.1 m/s or greater.    Baseline target 09/25/16   Time 6   Period Weeks   Status New     PT LONG TERM GOAL #5   Title Patient to verbalize method for fall recovery that improves safety with regards to hip precautions   Baseline target 09/25/16   Time 6   Period Weeks   Status New               Plan -  08/07/16 0851    Clinical Impression Statement Today's skilled session focused on establishement of HEP for right hip strengthening with not issues. Remainder of session continued to address right hip strengthening with some balance components added in. Pt needs UE support at this time for single leg stance activities. Pt is making steady progress toward goals and shoudl benefit from continued PT to progress toward unmet goals .  Rehab Potential Good   PT Frequency 2x / week   PT Duration 6 weeks   PT Treatment/Interventions Patient/family education;DME Instruction;ADLs/Self Care Home Management;Gait training;Stair training;Functional mobility training;Therapeutic activities;Therapeutic exercise;Balance training;Neuromuscular re-education;Scar mobilization;Taping   PT Next Visit Plan Initiate HEP for R hip strength, work on gait with cane esp curb/unlevel ground.   Consulted and Agree with Plan of Care Patient      Patient will benefit from skilled therapeutic intervention in order to improve the following deficits and impairments:  Abnormal gait, Decreased activity tolerance, Decreased balance, Decreased mobility, Decreased endurance, Decreased knowledge of use of DME, Decreased strength, Decreased safety awareness  Visit Diagnosis: Other abnormalities of gait and mobility  Unsteadiness on feet  Muscle weakness (generalized)     Problem List Patient Active Problem List   Diagnosis Date Noted  . Peripheral polyneuropathy ([place]) 07/20/2016  . DVT of deep femoral vein ([place]) 04/10/2016  . Cerebrovascular accident (CVA) due to embolism of cerebral artery ([place])   . Expressive aphasia 04/09/2016  . Anemia 04/09/2016  . Hypocalcemia 04/09/2016  . CVA (cerebral infarction) 04/08/2016  . 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 Thalamic infarct, acute ([place]) 02/01/2016  . Mass of left sphenoid sinus  02/01/2016  . Stroke ([place]) 01/31/2016  . Stroke-like symptoms 01/31/2016  . Hyperglycemia 01/31/2016  . Diabetes mellitus without complication ([place])   . Hypertension   . Obesity     Willow Ora, PTA, [facility] [address] [place], [place] [address] [phone] 08/09/16, 11:44 PM   Name: MUMTAZ LOVINS MRN: [id number] Date of Birth: 04-29-1951

## 2016-08-07 NOTE — Patient Instructions (Addendum)
Strengthening: Straight Leg Raise (Phase 1)    Tighten muscles on front of right thigh, then lift leg __2-3__ inches from surface, keeping knee locked.  Repeat _10___ times per set. Do _1_ sets per session. Do _1-2__ sessions per day.  http://orth.exer.us/614   Copyright  VHI. All rights reserved.    Bridge    Lie back, legs bent with arms at your sides. Lift hips up off mat and then slowly lower back down. Repeat _10_ times. Do _1-2_ sessions per day.  http://pm.exer.us/55   Copyright  VHI. All rights reserved.    Clam Shell 45 Degrees    Lying on left side with hips and knees bent 45, one pillow between knees and ankles. Lift knee. Be sure pelvis does not roll backward. Do not arch back. Do __10_ times with right leg,  __1-2_ times per day.  http://ss.exer.us/75   Copyright  VHI. All rights reserved.    Strengthening: Hip Abduction (Side-Lying)    Tighten muscles on front of right thigh, then lift leg _1-2___ inches from pillow between legs, keeping knee locked.  Repeat __10__ times per set. Do _1_ sets per session. Do _1-2_ sessions per day.  http://orth.exer.us/623   Copyright  VHI. All rights reserved.   Functional Quadriceps: Sit to Stand    Sit on edge of chair, feet flat on floor. Stand upright, extending knees fully, trying not to use hands. Repeat _10__ times per set. Do _1_ sets per session. Do _1-2_ sessions per day.  http://orth.exer.us/735   Copyright  VHI. All rights reserved.

## 2016-08-08 LAB — CUP PACEART REMOTE DEVICE CHECK
MDC IDC PG IMPLANT DT: 20170721
MDC IDC SESS DTM: 20171020000938

## 2016-08-08 NOTE — Progress Notes (Signed)
Carelink summary report received. Battery status OK. Normal device function. No new symptom episodes, tachy episodes, brady, or pause episodes. No new AF episodes. Monthly summary reports and ROV/PRN 

## 2016-08-10 ENCOUNTER — Ambulatory Visit (INDEPENDENT_AMBULATORY_CARE_PROVIDER_SITE_OTHER): Payer: Medicare Other | Admitting: *Deleted

## 2016-08-10 DIAGNOSIS — I6340 Cerebral infarction due to embolism of unspecified cerebral artery: Secondary | ICD-10-CM | POA: Diagnosis not present

## 2016-08-10 NOTE — Progress Notes (Signed)
[facility] Summary Report / Loop Recorder

## 2016-08-11 ENCOUNTER — Ambulatory Visit: Payer: Medicare Other | Admitting: Physical Therapy

## 2016-08-11 DIAGNOSIS — M6281 Muscle weakness (generalized): Secondary | ICD-10-CM | POA: Diagnosis not present

## 2016-08-11 DIAGNOSIS — R2689 Other abnormalities of gait and mobility: Secondary | ICD-10-CM

## 2016-08-11 DIAGNOSIS — R2681 Unsteadiness on feet: Secondary | ICD-10-CM | POA: Diagnosis not present

## 2016-08-11 LAB — CUP PACEART REMOTE DEVICE CHECK
Implantable Pulse Generator Implant Date: 20170721
MDC IDC SESS DTM: 20171119004511

## 2016-08-11 NOTE — Therapy (Signed)
[facility] [address] [place], [place], [address] Phone: [phone]   Fax:  [phone]  Physical Therapy Treatment  Patient Details  Name: [name] [name] MRN: [id number] Date of Birth: [birth year] Referring Provider: Jaynee Eagles  Encounter Date: 08/11/2016      PT End of Session - 08/11/16 1447    Visit Number 3   Number of Visits 13   Date for PT Re-Evaluation 09/25/16   Authorization Type Medicare, G-Code every 10th visit   PT Start Time [phone]   PT Stop Time 0930   PT Time Calculation (min) 41 min   Activity Tolerance Patient tolerated treatment well   Behavior During Therapy [facility] for tasks assessed/performed      Past Medical History:  Diagnosis Date  . Arthritis    "knees, hips, possibly back" (04/08/2016)  . Chronic bronchitis ([place])    "as a child; haven't had it in years" (04/08/2016)  . Chronic lower back pain    spondylosis  . Daily headache    "short ones for the last month or 2" (04/08/2016)  . GERD (gastroesophageal reflux disease)    takes Protonix daily "just to protect my stomach; not for reflux" (04/08/2016)  . Heart murmur    "dx'd by 1 anesthesiologist years and years ago"  . Hyperlipidemia    takes Crestor daily  . Hypertension    takes Valsartan-HCTZ daily  . Joint pain   . Macular degeneration    "just watching"  . Nocturia   . Peripheral edema   . Peripheral neuropathy (HCC)    not on any meds  . Sleep apnea    no CPAP since weight loss after bariatric surgery '12 (04/08/2016)  . Spondylosis   . Stroke ([place]) 04/07/2016   "little numbness in pinky of left hand" (04/08/2016)  . TIA (transient ischemic attack) 01/2016 X 2   Numbness on right mouth/gums/lips; & numbness in 3rd, 4th, & 5th right digits since" (04/08/2016)  . Type 2 diabetes, diet controlled ([place])   . Urinary frequency   . Urinary incontinence   . Urinary urgency     Past Surgical History:  Procedure Laterality Date  .  CATARACT EXTRACTION W/ INTRAOCULAR LENS  IMPLANT, BILATERAL Bilateral ~ 2012  . COLONOSCOPY    . EP IMPLANTABLE DEVICE N/A 04/10/2016   Procedure: Loop Recorder Insertion;  Surgeon: Will Meredith Leeds, MD;  Location: [facility];  Service: Cardiovascular;  Laterality: N/A;  . ESOPHAGOGASTRODUODENOSCOPY     in the 70's  . JOINT REPLACEMENT    . 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 SINUS ENDO W/FUSION  02/28/2016   Procedure: ENDOSCOPIC SINUS SURGERY WITH NAVIGATION;  Surgeon: Ruby Cola, MD;  Location: [facility];  Service: ENT;;  . TEE WITHOUT CARDIOVERSION N/A 04/10/2016   Procedure: TRANSESOPHAGEAL ECHOCARDIOGRAM (TEE) POSSIBLE LOOP;  Surgeon: Sanda Klein, MD;  Location: [facility];  Service: Cardiovascular;  Laterality: N/A;  . TONSILLECTOMY  1957  . TOTAL HIP ARTHROPLASTY Right 2008  . TOTAL HIP REVISION Right 03/30/2016   Procedure: RIGHT TOTAL HIP REVISION;  Surgeon: Frederik Pear, MD;  Location: [facility];  Service: Orthopedics;  Laterality: Right;  . TOTAL KNEE ARTHROPLASTY Bilateral 2001  . VAGINAL HYSTERECTOMY     "left my ovaries"    There were no vitals filed for this visit.      Subjective Assessment - 08/11/16 0853    Subjective Been doing the exercises that [name] [name] gave me.  Feel like I'm getting  better each day.   Pertinent History Patient reports history of neuopathy and three recent strokes.  Not sure if the strokes are impacting mobility as much as neuropathy and hip revision surgery in July.  Had stroke week after surgery, then had hematoma of hip two weeks after surgery which stopped therapy.   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   Limitations Walking   Patient Stated Goals To improve balance, feel more confident walking in community.   Currently in Pain? --  L hip discomfort-not pain, does not rate                         [facility] Adult PT Treatment/Exercise - 08/11/16 0001      Transfers   Transfers Sit to Stand;Stand to Sit   Sit  to Stand 7: Independent;Without upper extremity assist;From bed   Stand to Sit 7: Independent   Number of Reps 10 reps  from mat surface-as review of HEP     Ambulation/Gait   Ambulation/Gait Yes   Ambulation/Gait Assistance 5: Supervision   Ambulation/Gait Assistance Details Cues for gait training with proper sequence of SPC for improved posture and decreased Trendelenburg gait pattern   Ambulation Distance (Feet) 230 Feet  then 120 ft x 2   Assistive device Straight cane   Gait Pattern Step-through pattern;Trendelenburg;Wide base of support;Abducted- right;Lateral hip instability  decreased Trendelenburg with use of cane   Ambulation Surface Level;Indoor   Gait Comments Gait training with SPC, with initial cues for sequencing, with improved hip stability noted and decreased Trendelenburg pattern.  Pt appears to be more fluid with cane use by end of session and agrees to try at home.  Discussed using cane 3-4 minutes, 3x/day in home for improved gait pattern practice.     Exercises   Exercises Knee/Hip     Knee/Hip Exercises: Standing   Heel Raises Both;10 reps;Limitations;2 sets   Heel Raises Limitations light UE support needed   Hip Abduction AROM;Stengthening;Both;1 set;10 reps;Knee straight;Limitations   Abduction Limitations L step taps to side in order to keep R hip stable   Hip Extension Stengthening;AAROM;Both;1 set;10 reps;Limitations  UE support   Functional Squat 1 set;10 reps;Limitations   SLS Alternating step taps to 6" step forward and side; forward step ups x 10 reps on LLE, x 5 reps on RLE with UE support   Other Standing Knee Exercises Standing hamstring curls x 10 reps each leg, UE support     Knee/Hip Exercises: Supine   Quad Sets AROM;Strengthening;Right;5 reps   Bridges AROM;Strengthening;10 reps   Straight Leg Raises AROM;Strengthening;Right;2 sets;10 reps;Limitations   Straight Leg Raises Limitations Cues for quad sets to prevent quad lag with sLR      Knee/Hip Exercises: Sidelying   Hip ABduction Right;Strengthening;1 set;10 reps   Clams RLE x 10 reps   Other Sidelying Knee/Hip Exercises Pt requires cues for positioning, for proper technique (3 second hold, relax between reps) for optimal HEP performance                PT Education - 08/11/16 1447    Education provided Yes   Education Details Gait training with use of cane-benefits and technique   Person(s) Educated Patient   Methods Explanation;Demonstration;Verbal cues   Comprehension Verbalized understanding;Returned demonstration;Verbal cues required;Need further instruction             PT Long Term Goals - 08/03/16 1330      PT LONG TERM GOAL #  1   Title Patient will demonstrate decreased fall risk with Berg score at least 50/56   Baseline target 09/25/16   Time 6   Period Weeks   Status New     PT LONG TERM GOAL #2   Title Patient to demonstrate gait on uneven surfaces including curbs mod I with cane.    Baseline target 09/25/16   Time 6   Period Weeks   Status New     PT LONG TERM GOAL #3   Title Patient to be independent with HEP for strength, balance and conditioning (walking program).   Baseline target 09/25/16   Time 6   Period Weeks   Status New     PT LONG TERM GOAL #4   Title Patient to demonstrate gait with improved efficiency via speed at 1.1 m/s or greater.    Baseline target 09/25/16   Time 6   Period Weeks   Status New     PT LONG TERM GOAL #5   Title Patient to verbalize method for fall recovery that improves safety with regards to hip precautions   Baseline target 09/25/16   Time 6   Period Weeks   Status New               Plan - 08/11/16 1447    Clinical Impression Statement Review of HEP with additional strengthening activities performed in standing.  Pt is aware of need for UE support with activities for optimal posture and stability through R hip.  However, pt does require cues for proper technique of HEP.  Pt's gait  patterns improved with use of cane and with practice during PT session, pt has improved ease of gait with cane.  Pt will continue to benefit from further skilled PT to address strength, balance, and gait.   Rehab Potential Good   PT Frequency 2x / week   PT Duration 6 weeks   PT Treatment/Interventions Patient/family education;DME Instruction;ADLs/Self Care Home Management;Gait training;Stair training;Functional mobility training;Therapeutic activities;Therapeutic exercise;Balance training;Neuromuscular re-education;Scar mobilization;Taping   PT Next Visit Plan work on gait with cane esp curb/unlevel ground; continue strength and balance training; abdominal activation for upright posture   Consulted and Agree with Plan of Care Patient      Patient will benefit from skilled therapeutic intervention in order to improve the following deficits and impairments:  Abnormal gait, Decreased activity tolerance, Decreased balance, Decreased mobility, Decreased endurance, Decreased knowledge of use of DME, Decreased strength, Decreased safety awareness  Visit Diagnosis: Muscle weakness (generalized)  Other abnormalities of gait and mobility     Problem List Patient Active Problem List   Diagnosis Date Noted  . Peripheral polyneuropathy ([place]) 07/20/2016  . DVT of deep femoral vein ([place]) 04/10/2016  . Cerebrovascular accident (CVA) due to embolism of cerebral artery ([place])   . Expressive aphasia 04/09/2016  . Anemia 04/09/2016  . Hypocalcemia 04/09/2016  . CVA (cerebral infarction) 04/08/2016  . 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 Thalamic infarct, acute ([place]) 02/01/2016  . 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 Stroke ([place]) 01/31/2016  . Stroke-like symptoms 01/31/2016  . Hyperglycemia 01/31/2016  . Diabetes mellitus without complication ([place])   . Hypertension   . Obesity     Yulian Gosney  W. 08/11/2016, 2:51 PM  Frazier Butt., PT  [facility] [address] [place], [place], [address] Phone: [phone]   Fax:  [phone]  Name: [name] [name] MRN: [id number] Date of Birth: 04-27-1951

## 2016-08-12 ENCOUNTER — Ambulatory Visit: Payer: Medicare Other | Admitting: *Deleted

## 2016-08-12 DIAGNOSIS — R2681 Unsteadiness on feet: Secondary | ICD-10-CM

## 2016-08-12 DIAGNOSIS — R2689 Other abnormalities of gait and mobility: Secondary | ICD-10-CM | POA: Diagnosis not present

## 2016-08-12 DIAGNOSIS — M6281 Muscle weakness (generalized): Secondary | ICD-10-CM

## 2016-08-12 NOTE — Patient Instructions (Signed)
Encouraged to continue current HEP only due to fatigue and taking enough time to perform exercises as is.  Encouraged continued gait with cane and to consider use in community even though she has compensated for limitations and does not negotiate curbs without UE support or utilizing curb cuts.

## 2016-08-12 NOTE — Therapy (Signed)
[facility] [address] [place], [place], [address] Phone: [phone]   Fax:  [phone]  Physical Therapy Treatment  Patient Details  Name: [name] [name] MRN: [id number] Date of Birth: [birth year] Referring Provider: Jaynee Eagles  Encounter Date: 08/12/2016      PT End of Session - 08/12/16 1516    Visit Number 4   Number of Visits 13   Date for PT Re-Evaluation 09/25/16   Authorization Type Medicare, G-Code every 10th visit   PT Start Time 1405   PT Stop Time 1448   PT Time Calculation (min) 43 min   Activity Tolerance Patient tolerated treatment well   Behavior During Therapy [facility] for tasks assessed/performed      Past Medical History:  Diagnosis Date  . Arthritis    "knees, hips, possibly back" (04/08/2016)  . Chronic bronchitis ([place])    "as a child; haven't had it in years" (04/08/2016)  . Chronic lower back pain    spondylosis  . Daily headache    "short ones for the last month or 2" (04/08/2016)  . GERD (gastroesophageal reflux disease)    takes Protonix daily "just to protect my stomach; not for reflux" (04/08/2016)  . Heart murmur    "dx'd by 1 anesthesiologist years and years ago"  . Hyperlipidemia    takes Crestor daily  . Hypertension    takes Valsartan-HCTZ daily  . Joint pain   . Macular degeneration    "just watching"  . Nocturia   . Peripheral edema   . Peripheral neuropathy (HCC)    not on any meds  . Sleep apnea    no CPAP since weight loss after bariatric surgery '12 (04/08/2016)  . Spondylosis   . Stroke ([place]) 04/07/2016   "little numbness in pinky of left hand" (04/08/2016)  . TIA (transient ischemic attack) 01/2016 X 2   Numbness on right mouth/gums/lips; & numbness in 3rd, 4th, & 5th right digits since" (04/08/2016)  . Type 2 diabetes, diet controlled ([place])   . Urinary frequency   . Urinary incontinence   . Urinary urgency     Past Surgical History:  Procedure Laterality Date  .  CATARACT EXTRACTION W/ INTRAOCULAR LENS  IMPLANT, BILATERAL Bilateral ~ 2012  . COLONOSCOPY    . EP IMPLANTABLE DEVICE N/A 04/10/2016   Procedure: Loop Recorder Insertion;  Surgeon: Will Meredith Leeds, MD;  Location: [facility];  Service: Cardiovascular;  Laterality: N/A;  . ESOPHAGOGASTRODUODENOSCOPY     in the 70's  . JOINT REPLACEMENT    . 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 SINUS ENDO W/FUSION  02/28/2016   Procedure: ENDOSCOPIC SINUS SURGERY WITH NAVIGATION;  Surgeon: Ruby Cola, MD;  Location: [facility];  Service: ENT;;  . TEE WITHOUT CARDIOVERSION N/A 04/10/2016   Procedure: TRANSESOPHAGEAL ECHOCARDIOGRAM (TEE) POSSIBLE LOOP;  Surgeon: Sanda Klein, MD;  Location: [facility];  Service: Cardiovascular;  Laterality: N/A;  . TONSILLECTOMY  1957  . TOTAL HIP ARTHROPLASTY Right 2008  . TOTAL HIP REVISION Right 03/30/2016   Procedure: RIGHT TOTAL HIP REVISION;  Surgeon: Frederik Pear, MD;  Location: [facility];  Service: Orthopedics;  Laterality: Right;  . TOTAL KNEE ARTHROPLASTY Bilateral 2001  . VAGINAL HYSTERECTOMY     "left my ovaries"    There were no vitals filed for this visit.      Subjective Assessment - 08/12/16 1406    Subjective Saw the benefit of the cane and makes me want to use it  but only practicing it at home right now (don't want to use it out).   Pertinent History Patient reports history of neuopathy and three recent strokes.  Not sure if the strokes are impacting mobility as much as neuropathy and hip revision surgery in July.  Had stroke week after surgery, then had hematoma of hip two weeks after surgery which stopped therapy.   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   Limitations Walking   Patient Stated Goals To improve balance, feel more confident walking in community.   Currently in Pain? No/denies                         [facility] Adult PT Treatment/Exercise - 08/12/16 0001      Transfers   Transfers Sit to Stand   Sit to Stand  7: Independent;Without upper extremity assist;From bed   Stand to Sit 5: Supervision  cues to slowly lower   Number of Reps 10 reps     Ambulation/Gait   Ambulation/Gait Yes   Ambulation/Gait Assistance 5: Supervision   Ambulation/Gait Assistance Details cues for stablizing R hip in stance   Ambulation Distance (Feet) 345 Feet  tile and carpet   Assistive device Straight cane   Gait Pattern Step-through pattern   Ambulation Surface Level;Indoor   Stairs Yes   Stairs Assistance 6: Modified independent (Device/Increase time)   Stair Management Technique Two rails;Alternating pattern;Step to pattern;With cane;One rail Right  one trial with cane and rail, next with two rails   Number of Stairs 4  x 2   Height of Stairs 6   Curb 4: Min assist;5: Supervision   Curb Details (indicate cue type and reason) wtih cane on 7" min a to ascend, able to perform from 4-5" step with S multiple trials with options for sequencing with cane for increased comfort and stability.   Gait Comments tandem gait with min UE support     Knee/Hip Exercises: Standing   Heel Raises Both;10 reps;Limitations;2 sets   Heel Raises Limitations light UE support needed   Hip Abduction AROM;Stengthening;Both;1 set;10 reps;Knee straight;Limitations  alternating   Hip Extension Stengthening;10 reps;Knee straight  UE support   Lateral Step Up Right;Step Height: 6";10 reps  bilat UE support   Functional Squat 1 set;10 reps;Limitations   SLS Alternating step taps to 6" step forward and side, no UE support forward, one UE support to L side   Other Standing Knee Exercises standing isometric with ball on R side abduction and flexion x 10 sec hold x 3 reps     Knee/Hip Exercises: Seated   Sit to Sand 1 set;15 reps;without UE support     Knee/Hip Exercises: Supine   Bridges AROM;Strengthening;10 reps   Single Leg Bridge Strengthening;Right;10 reps;Limitations  cues for technique   Straight Leg Raises Strengthening;10  reps;Right;2 sets   Straight Leg Raises Limitations cues for quad set to prevent quad lag     Knee/Hip Exercises: Sidelying   Hip ABduction Right;Strengthening;1 set;10 reps   Clams RLE x 10 reps   Other Sidelying Knee/Hip Exercises cues for technique                PT Education - 08/12/16 1511    Education provided Yes   Education Details gait with cane   Person(s) Educated Patient   Methods Explanation;Demonstration   Comprehension Need further instruction;Verbalized understanding             PT Long Term Goals -  08/03/16 1330      PT LONG TERM GOAL #1   Title Patient will demonstrate decreased fall risk with Berg score at least 50/56   Baseline target 09/25/16   Time 6   Period Weeks   Status New     PT LONG TERM GOAL #2   Title Patient to demonstrate gait on uneven surfaces including curbs mod I with cane.    Baseline target 09/25/16   Time 6   Period Weeks   Status New     PT LONG TERM GOAL #3   Title Patient to be independent with HEP for strength, balance and conditioning (walking program).   Baseline target 09/25/16   Time 6   Period Weeks   Status New     PT LONG TERM GOAL #4   Title Patient to demonstrate gait with improved efficiency via speed at 1.1 m/s or greater.    Baseline target 09/25/16   Time 6   Period Weeks   Status New     PT LONG TERM GOAL #5   Title Patient to verbalize method for fall recovery that improves safety with regards to hip precautions   Baseline target 09/25/16   Time 6   Period Weeks   Status New               Plan - 08/12/16 1516    Clinical Impression Statement Patient progressing with gait with cane and focus time this session on curb negotiation.  Patient still feels most confident utilizing comensatory techniques with curb cuts or parking near and holding her car.  Seems to perform HEP correctly and frequently.  Progressing also with her endurance subjectively.  Continue skilled PT as pt progressing towards  all goals.    Rehab Potential Good   PT Frequency 2x / week   PT Duration 6 weeks   PT Treatment/Interventions Patient/family education;DME Instruction;ADLs/Self Care Home Management;Gait training;Stair training;Functional mobility training;Therapeutic activities;Therapeutic exercise;Balance training;Neuromuscular re-education;Scar mobilization;Taping   PT Next Visit Plan continue strength, balance and initiate abdominal/core strength   Consulted and Agree with Plan of Care Patient      Patient will benefit from skilled therapeutic intervention in order to improve the following deficits and impairments:  Abnormal gait, Decreased activity tolerance, Decreased balance, Decreased mobility, Decreased endurance, Decreased knowledge of use of DME, Decreased strength, Decreased safety awareness  Visit Diagnosis: Muscle weakness (generalized)  Unsteadiness on feet  Other abnormalities of gait and mobility     Problem List Patient Active Problem List   Diagnosis Date Noted  . Peripheral polyneuropathy ([place]) 07/20/2016  . DVT of deep femoral vein ([place]) 04/10/2016  . Cerebrovascular accident (CVA) due to embolism of cerebral artery ([place])   . Expressive aphasia 04/09/2016  . Anemia 04/09/2016  . Hypocalcemia 04/09/2016  . CVA (cerebral infarction) 04/08/2016  . 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 Thalamic infarct, acute ([place]) 02/01/2016  . 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 Stroke ([place]) 01/31/2016  . Stroke-like symptoms 01/31/2016  . Hyperglycemia 01/31/2016  . Diabetes mellitus without complication ([place])   . Hypertension   . Obesity     Reginia Naas 08/12/2016, 3:19 PM  Magda Kiel, [place] 08/12/2016   [facility] [address] [place], [place], [address] Phone: [phone]   Fax:  [phone]  Name: [name] [name] MRN: [id number] Date of Birth: 11-11-1950

## 2016-08-17 ENCOUNTER — Ambulatory Visit: Payer: Medicare Other | Admitting: *Deleted

## 2016-08-17 DIAGNOSIS — R2689 Other abnormalities of gait and mobility: Secondary | ICD-10-CM | POA: Diagnosis not present

## 2016-08-17 DIAGNOSIS — R2681 Unsteadiness on feet: Secondary | ICD-10-CM | POA: Diagnosis not present

## 2016-08-17 DIAGNOSIS — M6281 Muscle weakness (generalized): Secondary | ICD-10-CM | POA: Diagnosis not present

## 2016-08-17 NOTE — Therapy (Signed)
[facility] [address] [place], [place], [address] Phone: [phone]   Fax:  [phone]  Physical Therapy Treatment  Patient Details  Name: [name] [name] MRN: [id number] Date of Birth: 09-May-1951 Referring Provider: Jaynee Eagles  Encounter Date: 08/17/2016      PT End of Session - 08/17/16 1305    Visit Number 5   Number of Visits 13   Date for PT Re-Evaluation 09/25/16   Authorization Type Medicare, G-Code every 10th visit   PT Start Time 1100   PT Stop Time 1145   PT Time Calculation (min) 45 min   Equipment Utilized During Treatment Gait belt   Activity Tolerance Patient tolerated treatment well   Behavior During Therapy [facility] for tasks assessed/performed      Past Medical History:  Diagnosis Date  . Arthritis    "knees, hips, possibly back" (04/08/2016)  . Chronic bronchitis ([place])    "as a child; haven't had it in years" (04/08/2016)  . Chronic lower back pain    spondylosis  . Daily headache    "short ones for the last month or 2" (04/08/2016)  . GERD (gastroesophageal reflux disease)    takes Protonix daily "just to protect my stomach; not for reflux" (04/08/2016)  . Heart murmur    "dx'd by 1 anesthesiologist years and years ago"  . Hyperlipidemia    takes Crestor daily  . Hypertension    takes Valsartan-HCTZ daily  . Joint pain   . Macular degeneration    "just watching"  . Nocturia   . Peripheral edema   . Peripheral neuropathy (HCC)    not on any meds  . Sleep apnea    no CPAP since weight loss after bariatric surgery '12 (04/08/2016)  . Spondylosis   . Stroke ([place]) 04/07/2016   "little numbness in pinky of left hand" (04/08/2016)  . TIA (transient ischemic attack) 01/2016 X 2   Numbness on right mouth/gums/lips; & numbness in 3rd, 4th, & 5th right digits since" (04/08/2016)  . Type 2 diabetes, diet controlled ([place])   . Urinary frequency   . Urinary incontinence   . Urinary urgency     Past  Surgical History:  Procedure Laterality Date  . CATARACT EXTRACTION W/ INTRAOCULAR LENS  IMPLANT, BILATERAL Bilateral ~ 2012  . COLONOSCOPY    . EP IMPLANTABLE DEVICE N/A 04/10/2016   Procedure: Loop Recorder Insertion;  Surgeon: Will Meredith Leeds, MD;  Location: [facility];  Service: Cardiovascular;  Laterality: N/A;  . ESOPHAGOGASTRODUODENOSCOPY     in the 70's  . JOINT REPLACEMENT    . 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 SINUS ENDO W/FUSION  02/28/2016   Procedure: ENDOSCOPIC SINUS SURGERY WITH NAVIGATION;  Surgeon: Ruby Cola, MD;  Location: [facility];  Service: ENT;;  . TEE WITHOUT CARDIOVERSION N/A 04/10/2016   Procedure: TRANSESOPHAGEAL ECHOCARDIOGRAM (TEE) POSSIBLE LOOP;  Surgeon: Sanda Klein, MD;  Location: [facility];  Service: Cardiovascular;  Laterality: N/A;  . TONSILLECTOMY  1957  . TOTAL HIP ARTHROPLASTY Right 2008  . TOTAL HIP REVISION Right 03/30/2016   Procedure: RIGHT TOTAL HIP REVISION;  Surgeon: Frederik Pear, MD;  Location: [facility];  Service: Orthopedics;  Laterality: Right;  . TOTAL KNEE ARTHROPLASTY Bilateral 2001  . VAGINAL HYSTERECTOMY     "left my ovaries"    There were no vitals filed for this visit.      Subjective Assessment - 08/17/16 1057    Subjective Feel like the side leg  lift is still really hard.  Felt really sore after last session, hip and lower back.  So not doing lot of walking.    Pertinent History Patient reports history of neuopathy and three recent strokes.  Not sure if the strokes are impacting mobility as much as neuropathy and hip revision surgery in July.  Had stroke week after surgery, then had hematoma of hip two weeks after surgery which stopped therapy.   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   Limitations Walking   Patient Stated Goals To improve balance, feel more confident walking in community.   Currently in Pain? Yes   Pain Score 1    Pain Location Hip   Pain Orientation Right   Pain Descriptors / Indicators  Discomfort   Pain Type Chronic pain   Pain Onset More than a month ago   Pain Frequency Intermittent   Aggravating Factors  walking, sitting too long   Pain Relieving Factors rest or moving depending                         [facility] Adult PT Treatment/Exercise - 08/17/16 0001      Ambulation/Gait   Ambulation/Gait Yes   Ambulation/Gait Assistance 5: Supervision   Ambulation/Gait Assistance Details cues for posture, forward gaze, straight path   Ambulation Distance (Feet) 230 Feet  x 2, second with head turns to ID playing cards   Assistive device Straight cane   Gait Pattern Step-through pattern;Trendelenburg;Lateral hip instability;Lateral trunk lean to right;Wide base of support   Ambulation Surface Level;Indoor   Gait Comments negotiating around cones with cane, cues for keeping cane close.  Stepping throught cones with alternating taps to cone the step past for step length and SLS with min A/HHA and cane     Balance   Balance Assessed Yes     High Level Balance   High Level Balance Activities Tandem walking;Turns;Head turns;Side stepping;Negotitating around obstacles   High Level Balance Comments wall bumps for limits of stability, hip eccentric control x 10, issued to HEP     Exercises   Exercises Lumbar     Lumbar Exercises: Supine   Clam 10 reps   Clam Limitations x 2 sets with green band and core stabilization   Bridge 10 reps   Bridge Limitations with legs on red therapy ball   Other Supine Lumbar Exercises pelvic stabilization with post tilt with demonstration and tactile cues x 10 5 sec hold, then with hip abdu/add in hooklying x 5, then with alternating leg extension, then with trunk rotation on red ball.                PT Education - 08/17/16 1304    Education provided Yes   Education Details HEP for wall bumps (balance, hip strength), walking program   Person(s) Educated Patient   Methods Explanation;Demonstration;Handout    Comprehension Verbalized understanding             PT Long Term Goals - 08/03/16 1330      PT LONG TERM GOAL #1   Title Patient will demonstrate decreased fall risk with Berg score at least 50/56   Baseline target 09/25/16   Time 6   Period Weeks   Status New     PT LONG TERM GOAL #2   Title Patient to demonstrate gait on uneven surfaces including curbs mod I with cane.    Baseline target 09/25/16   Time 6  Period Weeks   Status New     PT LONG TERM GOAL #3   Title Patient to be independent with HEP for strength, balance and conditioning (walking program).   Baseline target 09/25/16   Time 6   Period Weeks   Status New     PT LONG TERM GOAL #4   Title Patient to demonstrate gait with improved efficiency via speed at 1.1 m/s or greater.    Baseline target 09/25/16   Time 6   Period Weeks   Status New     PT LONG TERM GOAL #5   Title Patient to verbalize method for fall recovery that improves safety with regards to hip precautions   Baseline target 09/25/16   Time 6   Period Weeks   Status New               Plan - 08/17/16 1306    Clinical Impression Statement Patient able to demonstrate wall bumps without difficulty with good work on hip eccentric control.  Still not utilizing cane outside and not doing walking program.  Needs continued skilled PT to progress to goals and improve safety, balance and LE strength.    Rehab Potential Good   PT Frequency 2x / week   PT Duration 6 weeks   PT Treatment/Interventions Patient/family education;DME Instruction;ADLs/Self Care Home Management;Gait training;Stair training;Functional mobility training;Therapeutic activities;Therapeutic exercise;Balance training;Neuromuscular re-education;Scar mobilization;Taping   PT Next Visit Plan continue core strength, consider for HEP, balance work and gait on unlevel surfaces   Consulted and Agree with Plan of Care Patient      Patient will benefit from skilled therapeutic intervention  in order to improve the following deficits and impairments:  Abnormal gait, Decreased activity tolerance, Decreased balance, Decreased mobility, Decreased endurance, Decreased knowledge of use of DME, Decreased strength, Decreased safety awareness  Visit Diagnosis: Muscle weakness (generalized)  Unsteadiness on feet  Other abnormalities of gait and mobility     Problem List Patient Active Problem List   Diagnosis Date Noted  . Peripheral polyneuropathy ([place]) 07/20/2016  . DVT of deep femoral vein ([place]) 04/10/2016  . Cerebrovascular accident (CVA) due to embolism of cerebral artery ([place])   . Expressive aphasia 04/09/2016  . Anemia 04/09/2016  . Hypocalcemia 04/09/2016  . CVA (cerebral infarction) 04/08/2016  . 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 Thalamic infarct, acute ([place]) 02/01/2016  . 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 Stroke ([place]) 01/31/2016  . Stroke-like symptoms 01/31/2016  . Hyperglycemia 01/31/2016  . Diabetes mellitus without complication ([place])   . Hypertension   . Obesity     Reginia Naas 08/17/2016, 1:08 PM Magda Kiel, [place] 08/17/2016  [facility] [address] [place], [place], [address] Phone: [phone]   Fax:  [phone]  Name: [name] [name] MRN: [id number] Date of Birth: Aug 25, 1951

## 2016-08-17 NOTE — Patient Instructions (Addendum)
Weight Shift: Anterior / Posterior (Righting / Equilibrium)    Stand with back against the wall, slowly shift weight forward, arms back and hips forward over toes, until heels rise off floor. Return to starting position. Shift weight backward, arms forward and hips back over heels, until toes rise off floor. Hold each position __5__ seconds. Repeat _10___ times per session. Do __1__ sessions per day.  Copyright  VHI. All rights reserved.   Also educated pt in walking program for fitness with cane for 5-7 min then increase 1 min each week walking at least 5 x/week

## 2016-08-20 ENCOUNTER — Encounter: Payer: Self-pay | Admitting: Physical Therapy

## 2016-08-20 ENCOUNTER — Ambulatory Visit: Payer: Medicare Other | Admitting: Physical Therapy

## 2016-08-20 DIAGNOSIS — M6281 Muscle weakness (generalized): Secondary | ICD-10-CM | POA: Diagnosis not present

## 2016-08-20 DIAGNOSIS — R2681 Unsteadiness on feet: Secondary | ICD-10-CM

## 2016-08-20 DIAGNOSIS — R2689 Other abnormalities of gait and mobility: Secondary | ICD-10-CM

## 2016-08-20 NOTE — Therapy (Signed)
[facility] [address] [place], [place], [address] Phone: [phone]   Fax:  [phone]  Physical Therapy Treatment  Patient Details  Name: [name] [name] MRN: [id number] Date of Birth: 06-06-1951 Referring Provider: Jaynee Eagles  Encounter Date: 08/20/2016      PT End of Session - 08/20/16 1159    Visit Number 6   Number of Visits 13   Date for PT Re-Evaluation 09/25/16   Authorization Type Medicare, G-Code every 10th visit   PT Start Time 0845   PT Stop Time 0925   PT Time Calculation (min) 40 min   Equipment Utilized During Treatment Gait belt   Activity Tolerance Patient tolerated treatment well   Behavior During Therapy [facility] for tasks assessed/performed      Past Medical History:  Diagnosis Date  . Arthritis    "knees, hips, possibly back" (04/08/2016)  . Chronic bronchitis ([place])    "as a child; haven't had it in years" (04/08/2016)  . Chronic lower back pain    spondylosis  . Daily headache    "short ones for the last month or 2" (04/08/2016)  . GERD (gastroesophageal reflux disease)    takes Protonix daily "just to protect my stomach; not for reflux" (04/08/2016)  . Heart murmur    "dx'd by 1 anesthesiologist years and years ago"  . Hyperlipidemia    takes Crestor daily  . Hypertension    takes Valsartan-HCTZ daily  . Joint pain   . Macular degeneration    "just watching"  . Nocturia   . Peripheral edema   . Peripheral neuropathy (H[facility])    not on any meds  . Sleep apnea    no CPAP since weight loss after bariatric surgery '12 (04/08/2016)  . Spondylosis   . Stroke ([place]) 04/07/2016   "little numbness in pinky of left hand" (04/08/2016)  . TIA (transient ischemic attack) 01/2016 X 2   Numbness on right mouth/gums/lips; & numbness in 3rd, 4th, & 5th right digits since" (04/08/2016)  . Type 2 diabetes, diet controlled ([place])   . Urinary frequency   . Urinary incontinence   . Urinary urgency     Past  Surgical History:  Procedure Laterality Date  . CATARACT EXTRACTION W/ INTRAOCULAR LENS  IMPLANT, BILATERAL Bilateral ~ 2012  . COLONOSCOPY    . EP IMPLANTABLE DEVICE N/A 04/10/2016   Procedure: Loop Recorder Insertion;  Surgeon: Will Meredith Leeds, MD;  Location: [facility];  Service: Cardiovascular;  Laterality: N/A;  . ESOPHAGOGASTRODUODENOSCOPY     in the 70's  . JOINT REPLACEMENT    . 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 SINUS ENDO W/FUSION  02/28/2016   Procedure: ENDOSCOPIC SINUS SURGERY WITH NAVIGATION;  Surgeon: Ruby Cola, MD;  Location: [facility];  Service: ENT;;  . TEE WITHOUT CARDIOVERSION N/A 04/10/2016   Procedure: TRANSESOPHAGEAL ECHOCARDIOGRAM (TEE) POSSIBLE LOOP;  Surgeon: Sanda Klein, MD;  Location: [facility];  Service: Cardiovascular;  Laterality: N/A;  . TONSILLECTOMY  1957  . TOTAL HIP ARTHROPLASTY Right 2008  . TOTAL HIP REVISION Right 03/30/2016   Procedure: RIGHT TOTAL HIP REVISION;  Surgeon: Frederik Pear, MD;  Location: [facility];  Service: Orthopedics;  Laterality: Right;  . TOTAL KNEE ARTHROPLASTY Bilateral 2001  . VAGINAL HYSTERECTOMY     "left my ovaries"    There were no vitals filed for this visit.      Subjective Assessment - 08/20/16 0849    Subjective Patient reports her R hip  is a little painful today but she has been more active as well.   Pertinent History Patient reports history of neuopathy and three recent strokes.  Not sure if the strokes are impacting mobility as much as neuropathy and hip revision surgery in July.  Had stroke week after surgery, then had hematoma of hip two weeks after surgery which stopped therapy.   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   Limitations Walking   Patient Stated Goals To improve balance, feel more confident walking in community.   Currently in Pain? Yes   Pain Score 2    Pain Location Hip   Pain Orientation Right   Pain Descriptors / Indicators Discomfort   Pain Type Chronic pain   Pain  Onset More than a month ago   Pain Frequency Intermittent   Aggravating Factors  walking, sitting too long   Pain Relieving Factors rest or moving depending       Therapeutic exercise   Sit<>stand w/ red theraband around knees for gluteal activation; 3x 10  Clamshells w/ red theraband; 3x 10  Bridges; 3x 10; VC for gluteal activation  High Level Balance  All balance activities performed at counter top:  Marching fwd/bwd, heel walking fwd/bwd, toe walking fwd/bwd, tandem fwd/bwd; single UE support;  lateral steps, braiding; BIL UE support; 3 laps each; Demo and VC for technique and sequencing.       [facility] Adult PT Treatment/Exercise - 08/20/16 1303      Ambulation/Gait   Ambulation/Gait Yes   Ambulation/Gait Assistance 5: Supervision   Ambulation/Gait Assistance Details verbal and tactile cues for posture   Ambulation Distance (Feet) 115 Feet   Assistive device Straight cane   Gait Pattern Step-through pattern;Trendelenburg;Lateral hip instability;Lateral trunk lean to right;Wide base of support   Ambulation Surface Level;Indoor   Ramp 5: Supervision   Curb 4: Min assist   Curb Details (indicate cue type and reason) Able to descend curb with min assist; however unable to ascend curb due to balance dificulties and fear of falling.    Gait Comments 4" step up and over on aerobic step w/ cane to simulate low curb; 10x; VC for sequencing and cane placement.           PT Long Term Goals - 08/03/16 1330      PT LONG TERM GOAL #1   Title Patient will demonstrate decreased fall risk with Berg score at least 50/56   Baseline target 09/25/16   Time 6   Period Weeks   Status New     PT LONG TERM GOAL #2   Title Patient to demonstrate gait on uneven surfaces including curbs mod I with cane.    Baseline target 09/25/16   Time 6   Period Weeks   Status New     PT LONG TERM GOAL #3   Title Patient to be independent with HEP for strength, balance and conditioning (walking  program).   Baseline target 09/25/16   Time 6   Period Weeks   Status New     PT LONG TERM GOAL #4   Title Patient to demonstrate gait with improved efficiency via speed at 1.1 m/s or greater.    Baseline target 09/25/16   Time 6   Period Weeks   Status New     PT LONG TERM GOAL #5   Title Patient to verbalize method for fall recovery that improves safety with regards to hip precautions   Baseline target 09/25/16  Time 6   Period Weeks   Status New          Plan - 08/20/16 1200    Clinical Impression Statement Patient demonstrated significant difficulty ascending and descending curb with only cane for support. She was able to ascend and descend 4in aerobic step with cueing. Patient is progressing towards goals and should benefit from continued PT to achieve unmet goals.    Rehab Potential Good   PT Frequency 2x / week   PT Duration 6 weeks   PT Treatment/Interventions Patient/family education;DME Instruction;ADLs/Self Care Home Management;Gait training;Stair training;Functional mobility training;Therapeutic activities;Therapeutic exercise;Balance training;Neuromuscular re-education;Scar mobilization;Taping   PT Next Visit Plan continue core strength, consider for HEP, balance work and gait on unlevel surfaces   Consulted and Agree with Plan of Care Patient      Patient will benefit from skilled therapeutic intervention in order to improve the following deficits and impairments:  Abnormal gait, Decreased activity tolerance, Decreased balance, Decreased mobility, Decreased endurance, Decreased knowledge of use of DME, Decreased strength, Decreased safety awareness  Visit Diagnosis: Muscle weakness (generalized)  Unsteadiness on feet  Other abnormalities of gait and mobility     Problem List Patient Active Problem List   Diagnosis Date Noted  . Peripheral polyneuropathy ([place]) 07/20/2016  . DVT of deep femoral vein ([place]) 04/10/2016  . Cerebrovascular accident (CVA) due to  embolism of cerebral artery ([place])   . Expressive aphasia 04/09/2016  . Anemia 04/09/2016  . Hypocalcemia 04/09/2016  . CVA (cerebral infarction) 04/08/2016  . 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 Thalamic infarct, acute ([place]) 02/01/2016  . 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 Stroke ([place]) 01/31/2016  . Stroke-like symptoms 01/31/2016  . Hyperglycemia 01/31/2016  . Diabetes mellitus without complication ([place])   . Hypertension   . Obesity     Benjiman Core, SPTA 08/20/2016, 1:08 PM  [facility] [address] [place], [place], [address] Phone: [phone]   Fax:  [phone]  Name: [name] [name] MRN: [id number] Date of Birth: 1950-12-22

## 2016-08-24 ENCOUNTER — Ambulatory Visit: Payer: Medicare Other | Attending: Neurology | Admitting: Physical Therapy

## 2016-08-24 ENCOUNTER — Encounter: Payer: Self-pay | Admitting: Physical Therapy

## 2016-08-24 DIAGNOSIS — M6281 Muscle weakness (generalized): Secondary | ICD-10-CM | POA: Diagnosis not present

## 2016-08-24 DIAGNOSIS — R2689 Other abnormalities of gait and mobility: Secondary | ICD-10-CM | POA: Diagnosis not present

## 2016-08-24 DIAGNOSIS — R2681 Unsteadiness on feet: Secondary | ICD-10-CM | POA: Diagnosis not present

## 2016-08-24 NOTE — Therapy (Signed)
[facility] [address] [place], [place], [address] Phone: [phone]   Fax:  [phone]  Physical Therapy Treatment  Patient Details  Name: [name] [name] MRN: [id number] Date of Birth: 1951/04/03 Referring Provider: Jaynee Eagles  Encounter Date: 08/24/2016      PT End of Session - 08/24/16 1450    Visit Number 7   Number of Visits 13   Date for PT Re-Evaluation 09/25/16   Authorization Type Medicare, G-Code every 10th visit   PT Start Time 1315   PT Stop Time 1357   PT Time Calculation (min) 42 min   Equipment Utilized During Treatment Gait belt   Activity Tolerance Patient tolerated treatment well   Behavior During Therapy [facility] for tasks assessed/performed      Past Medical History:  Diagnosis Date  . Arthritis    "knees, hips, possibly back" (04/08/2016)  . Chronic bronchitis ([place])    "as a child; haven't had it in years" (04/08/2016)  . Chronic lower back pain    spondylosis  . Daily headache    "short ones for the last month or 2" (04/08/2016)  . GERD (gastroesophageal reflux disease)    takes Protonix daily "just to protect my stomach; not for reflux" (04/08/2016)  . Heart murmur    "dx'd by 1 anesthesiologist years and years ago"  . Hyperlipidemia    takes Crestor daily  . Hypertension    takes Valsartan-HCTZ daily  . Joint pain   . Macular degeneration    "just watching"  . Nocturia   . Peripheral edema   . Peripheral neuropathy (HCC)    not on any meds  . Sleep apnea    no CPAP since weight loss after bariatric surgery '12 (04/08/2016)  . Spondylosis   . Stroke ([place]) 04/07/2016   "little numbness in pinky of left hand" (04/08/2016)  . TIA (transient ischemic attack) 01/2016 X 2   Numbness on right mouth/gums/lips; & numbness in 3rd, 4th, & 5th right digits since" (04/08/2016)  . Type 2 diabetes, diet controlled ([place])   . Urinary frequency   . Urinary incontinence   . Urinary urgency     Past  Surgical History:  Procedure Laterality Date  . CATARACT EXTRACTION W/ INTRAOCULAR LENS  IMPLANT, BILATERAL Bilateral ~ 2012  . COLONOSCOPY    . EP IMPLANTABLE DEVICE N/A 04/10/2016   Procedure: Loop Recorder Insertion;  Surgeon: Will Meredith Leeds, MD;  Location: [facility];  Service: Cardiovascular;  Laterality: N/A;  . ESOPHAGOGASTRODUODENOSCOPY     in the 70's  . JOINT REPLACEMENT    . 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 SINUS ENDO W/FUSION  02/28/2016   Procedure: ENDOSCOPIC SINUS SURGERY WITH NAVIGATION;  Surgeon: Ruby Cola, MD;  Location: [facility];  Service: ENT;;  . TEE WITHOUT CARDIOVERSION N/A 04/10/2016   Procedure: TRANSESOPHAGEAL ECHOCARDIOGRAM (TEE) POSSIBLE LOOP;  Surgeon: Sanda Klein, MD;  Location: [facility];  Service: Cardiovascular;  Laterality: N/A;  . TONSILLECTOMY  1957  . TOTAL HIP ARTHROPLASTY Right 2008  . TOTAL HIP REVISION Right 03/30/2016   Procedure: RIGHT TOTAL HIP REVISION;  Surgeon: Frederik Pear, MD;  Location: [facility];  Service: Orthopedics;  Laterality: Right;  . TOTAL KNEE ARTHROPLASTY Bilateral 2001  . VAGINAL HYSTERECTOMY     "left my ovaries"    There were no vitals filed for this visit.      Subjective Assessment - 08/24/16 1318    Subjective Patient reported a feeling of  being unsteady when first rising to perform exercises.   Pertinent History Patient reports history of neuopathy and three recent strokes.  Not sure if the strokes are impacting mobility as much as neuropathy and hip revision surgery in July.  Had stroke week after surgery, then had hematoma of hip two weeks after surgery which stopped therapy.   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   Limitations Walking   Patient Stated Goals To improve balance, feel more confident walking in community.   Currently in Pain? Yes   Pain Score 2    Pain Location Hip   Pain Orientation Right   Pain Descriptors / Indicators Discomfort   Pain Type Chronic pain   Pain Onset  More than a month ago   Pain Frequency Intermittent           [facility] Adult PT Treatment/Exercise - 08/24/16 1455      Ambulation/Gait   Curb 4: Min assist;5: Supervision  HHA to ascend   [employer] Details (indicate cue type and reason) 5 trials performed; pt required min HHA to ascend and could descend with supervision.      Therapeutic Exercise  4" step up and over on aerobic step w/ cane to simulate low curb; 10x; VC for sequencing and cane placement.  Dead bug beginning with hip flexion and progressing to reciprocal arms and legs. VC and demo for sequencing.   Seated lateral elbow taps to the mat for oblique activation; 10 reps each side  High Level Balance   All balance activities performed at counter top on two red mats placed side by side:  Marching fwd/bwd, heel walking fwd/bwd, toe walking fwd/bwd, tandem fwd/bwd; single UE support;  lateral steps, braiding; BIL UE support; 3 laps each; Demo and VC for technique and sequencing.       PT Long Term Goals - 08/03/16 1330      PT LONG TERM GOAL #1   Title Patient will demonstrate decreased fall risk with Berg score at least 50/56   Baseline target 09/25/16   Time 6   Period Weeks   Status New     PT LONG TERM GOAL #2   Title Patient to demonstrate gait on uneven surfaces including curbs mod I with cane.    Baseline target 09/25/16   Time 6   Period Weeks   Status New     PT LONG TERM GOAL #3   Title Patient to be independent with HEP for strength, balance and conditioning (walking program).   Baseline target 09/25/16   Time 6   Period Weeks   Status New     PT LONG TERM GOAL #4   Title Patient to demonstrate gait with improved efficiency via speed at 1.1 m/s or greater.    Baseline target 09/25/16   Time 6   Period Weeks   Status New     PT LONG TERM GOAL #5   Title Patient to verbalize method for fall recovery that improves safety with regards to hip precautions   Baseline target 09/25/16   Time 6   Period Weeks    Status New           Plan - 08/24/16 1451    Clinical Impression Statement Todays skilled session focused on core and LE strengthening, along with balance activities. Patient showed improvement with high level balance activities performed at counter top since last session. She is progressing towards goals and should benefit from continued PT to achieve unmet  goals.    Rehab Potential Good   PT Frequency 2x / week   PT Duration 6 weeks   PT Treatment/Interventions Patient/family education;DME Instruction;ADLs/Self Care Home Management;Gait training;Stair training;Functional mobility training;Therapeutic activities;Therapeutic exercise;Balance training;Neuromuscular re-education;Scar mobilization;Taping   PT Next Visit Plan continue core strength, balance work and gait on unlevel surfaces   Consulted and Agree with Plan of Care Patient      Patient will benefit from skilled therapeutic intervention in order to improve the following deficits and impairments:  Abnormal gait, Decreased activity tolerance, Decreased balance, Decreased mobility, Decreased endurance, Decreased knowledge of use of DME, Decreased strength, Decreased safety awareness  Visit Diagnosis: Muscle weakness (generalized)  Unsteadiness on feet  Other abnormalities of gait and mobility     Problem List Patient Active Problem List   Diagnosis Date Noted  . Peripheral polyneuropathy ([place]) 07/20/2016  . DVT of deep femoral vein ([place]) 04/10/2016  . Cerebrovascular accident (CVA) due to embolism of cerebral artery ([place])   . Expressive aphasia 04/09/2016  . Anemia 04/09/2016  . Hypocalcemia 04/09/2016  . CVA (cerebral infarction) 04/08/2016  . 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 Thalamic infarct, acute ([place]) 02/01/2016  . 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 Stroke ([place]) 01/31/2016  . Stroke-like symptoms 01/31/2016   . Hyperglycemia 01/31/2016  . Diabetes mellitus without complication ([place])   . Hypertension   . Obesity     Benjiman Core, [place]le [place] 08/24/2016, 2:58 PM  [facility] [address] [place], [place], [address] Phone: [phone]   Fax:  [phone]  Name: [name] [name] MRN: [id number] Date of Birth: [birth year]

## 2016-08-24 NOTE — Patient Instructions (Addendum)
Combination (Hook-Lying)    Tighten stomach and slowly raise left leg and lower opposite arm over head. Keep trunk rigid. Repeat __20__ times per set. Do __2__ sets per session.   http://orth.exer.us/1092   Copyright  VHI. All rights reserved.   Oblique taps  In a seated position on a couch or bed place hands together at chest. Lean to the right and tap elbow against bed/couch. Come back up to starting position before repeating on the right.

## 2016-08-25 ENCOUNTER — Telehealth: Payer: Self-pay | Admitting: Cardiology

## 2016-08-25 DIAGNOSIS — E785 Hyperlipidemia, unspecified: Secondary | ICD-10-CM | POA: Diagnosis not present

## 2016-08-25 DIAGNOSIS — R011 Cardiac murmur, unspecified: Secondary | ICD-10-CM | POA: Diagnosis not present

## 2016-08-25 DIAGNOSIS — G629 Polyneuropathy, unspecified: Secondary | ICD-10-CM | POA: Diagnosis not present

## 2016-08-25 DIAGNOSIS — I639 Cerebral infarction, unspecified: Secondary | ICD-10-CM | POA: Diagnosis not present

## 2016-08-25 DIAGNOSIS — I82402 Acute embolism and thrombosis of unspecified deep veins of left lower extremity: Secondary | ICD-10-CM | POA: Diagnosis not present

## 2016-08-25 DIAGNOSIS — I253 Aneurysm of heart: Secondary | ICD-10-CM | POA: Diagnosis not present

## 2016-08-25 DIAGNOSIS — D352 Benign neoplasm of pituitary gland: Secondary | ICD-10-CM | POA: Diagnosis not present

## 2016-08-25 NOTE — Telephone Encounter (Signed)
Pt called and stated that she is having an MRI on Friday. She wanted to know what she needed to do or if there was any problem having the MRI. I informed pt that she can have the MRI just our protocol is that pts send a manual transmission w/ the home monitor before she leaves her home for the appt. Instructed pt how to send the transmission. Pt verbalized understanding.

## 2016-08-26 DIAGNOSIS — D352 Benign neoplasm of pituitary gland: Secondary | ICD-10-CM | POA: Diagnosis not present

## 2016-08-27 ENCOUNTER — Encounter: Payer: Self-pay | Admitting: Physical Therapy

## 2016-08-27 ENCOUNTER — Ambulatory Visit: Payer: Medicare Other | Admitting: Physical Therapy

## 2016-08-27 DIAGNOSIS — R2681 Unsteadiness on feet: Secondary | ICD-10-CM

## 2016-08-27 DIAGNOSIS — M6281 Muscle weakness (generalized): Secondary | ICD-10-CM

## 2016-08-27 DIAGNOSIS — R2689 Other abnormalities of gait and mobility: Secondary | ICD-10-CM | POA: Diagnosis not present

## 2016-08-28 DIAGNOSIS — D352 Benign neoplasm of pituitary gland: Secondary | ICD-10-CM | POA: Diagnosis not present

## 2016-08-28 NOTE — Therapy (Signed)
[facility] [address] [place], [place], [address] Phone: [phone]   Fax:  [phone]  Physical Therapy Treatment  Patient Details  Name: [name] [name] MRN: [id number] Date of Birth: 12-07-1950 Referring Provider: Jaynee Eagles  Encounter Date: 08/27/2016      PT End of Session - 08/27/16 0853    Visit Number 8   Number of Visits 13   Date for PT Re-Evaluation 09/25/16   Authorization Type Medicare, G-Code every 10th visit   PT Start Time 0850   PT Stop Time 0930   PT Time Calculation (min) 40 min   Equipment Utilized During Treatment Gait belt   Activity Tolerance Patient tolerated treatment well   Behavior During Therapy [facility] for tasks assessed/performed      Past Medical History:  Diagnosis Date  . Arthritis    "knees, hips, possibly back" (04/08/2016)  . Chronic bronchitis ([place])    "as a child; haven't had it in years" (04/08/2016)  . Chronic lower back pain    spondylosis  . Daily headache    "short ones for the last month or 2" (04/08/2016)  . GERD (gastroesophageal reflux disease)    takes Protonix daily "just to protect my stomach; not for reflux" (04/08/2016)  . Heart murmur    "dx'd by 1 anesthesiologist years and years ago"  . Hyperlipidemia    takes Crestor daily  . Hypertension    takes Valsartan-HCTZ daily  . Joint pain   . Macular degeneration    "just watching"  . Nocturia   . Peripheral edema   . Peripheral neuropathy (HCC)    not on any meds  . Sleep apnea    no CPAP since weight loss after bariatric surgery '12 (04/08/2016)  . Spondylosis   . Stroke ([place]) 04/07/2016   "little numbness in pinky of left hand" (04/08/2016)  . TIA (transient ischemic attack) 01/2016 X 2   Numbness on right mouth/gums/lips; & numbness in 3rd, 4th, & 5th right digits since" (04/08/2016)  . Type 2 diabetes, diet controlled ([place])   . Urinary frequency   . Urinary incontinence   . Urinary urgency     Past  Surgical History:  Procedure Laterality Date  . CATARACT EXTRACTION W/ INTRAOCULAR LENS  IMPLANT, BILATERAL Bilateral ~ 2012  . COLONOSCOPY    . EP IMPLANTABLE DEVICE N/A 04/10/2016   Procedure: Loop Recorder Insertion;  Surgeon: Will Meredith Leeds, MD;  Location: [facility];  Service: Cardiovascular;  Laterality: N/A;  . ESOPHAGOGASTRODUODENOSCOPY     in the 70's  . JOINT REPLACEMENT    . 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 SINUS ENDO W/FUSION  02/28/2016   Procedure: ENDOSCOPIC SINUS SURGERY WITH NAVIGATION;  Surgeon: Ruby Cola, MD;  Location: [facility];  Service: ENT;;  . TEE WITHOUT CARDIOVERSION N/A 04/10/2016   Procedure: TRANSESOPHAGEAL ECHOCARDIOGRAM (TEE) POSSIBLE LOOP;  Surgeon: Sanda Klein, MD;  Location: [facility];  Service: Cardiovascular;  Laterality: N/A;  . TONSILLECTOMY  1957  . TOTAL HIP ARTHROPLASTY Right 2008  . TOTAL HIP REVISION Right 03/30/2016   Procedure: RIGHT TOTAL HIP REVISION;  Surgeon: Frederik Pear, MD;  Location: [facility];  Service: Orthopedics;  Laterality: Right;  . TOTAL KNEE ARTHROPLASTY Bilateral 2001  . VAGINAL HYSTERECTOMY     "left my ovaries"    There were no vitals filed for this visit.      Subjective Assessment - 08/27/16 0851    Subjective No new complaints. No falls  to report. Does have pain going from back into right hip today, 2/10.    Pertinent History Patient reports history of neuopathy and three recent strokes.  Not sure if the strokes are impacting mobility as much as neuropathy and hip revision surgery in July.  Had stroke week after surgery, then had hematoma of hip two weeks after surgery which stopped therapy.   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   Patient Stated Goals To improve balance, feel more confident walking in community.   Currently in Pain? Yes   Pain Score 2    Pain Location Back   Pain Orientation Right   Pain Descriptors / Indicators Aching;Discomfort   Pain Type Chronic pain   Pain Radiating  Towards into right hip   Pain Onset More than a month ago   Pain Frequency Intermittent   Aggravating Factors  walking, sitting too long   Pain Relieving Factors rest, changing positions              [facility] Adult PT Treatment/Exercise - 08/27/16 0856      Lumbar Exercises: Supine   Clam 10 reps;5 seconds;Limitations   Clam Limitations using green band, 2 sets with 5 sec holds each rep   Bridge Non-compliant;10 reps;5 seconds   Bridge Limitations feet on mat, 5 sec holds. cues for form and hold time.   Other Supine Lumbar Exercises with legs over red pball: bridge 5 sec holds x 10 reps with arms crossed over chest; lower trunk rotation with 5 sec holds x 10 each side, arms at sides.      Lumbar Exercises: Sidelying   Clam 10 reps;Limitations   Clam Limitations manual assist for pelvic positioning with cues on correct ex form and technique   Hip Abduction 10 reps;Limitations   Hip Abduction Limitations manual assist for correct pelvic position with cues on ex form and technique.              Balance Exercises - 08/27/16 0912      Balance Exercises: Standing   Rockerboard Anterior/posterior;Lateral;Head turns;EC;EO;Other time (comment);10 reps   Balance Beam standing across blue foam balance beam: alternating fwd stepping to floor and back onto foam beam x 10 reps each leg with min/mod HHA for balance, cues on weight shifting and ex form/technique.     Balance Exercises: Standing   Rebounder Limitations performed both ways on balance board with no UE support: EO rocking board with emphasis on tall posture progressing to holding board steady with: EC no head movements, EO head movements up<>down, left<>right, EC head movements up<>down and left<>right. min to mod assist for balance with all these activities                                      PT Long Term Goals - 08/03/16 1330      PT LONG TERM GOAL #1   Title Patient will demonstrate decreased fall risk with Berg  score at least 50/56   Baseline target 09/25/16   Time 6   Period Weeks   Status New     PT LONG TERM GOAL #2   Title Patient to demonstrate gait on uneven surfaces including curbs mod I with cane.    Baseline target 09/25/16   Time 6   Period Weeks   Status New     PT LONG TERM GOAL #3   Title Patient to  be independent with HEP for strength, balance and conditioning (walking program).   Baseline target 09/25/16   Time 6   Period Weeks   Status New     PT LONG TERM GOAL #4   Title Patient to demonstrate gait with improved efficiency via speed at 1.1 m/s or greater.    Baseline target 09/25/16   Time 6   Period Weeks   Status New     PT LONG TERM GOAL #5   Title Patient to verbalize method for fall recovery that improves safety with regards to hip precautions   Baseline target 09/25/16   Time 6   Period Weeks   Status New           Plan - 08/27/16 0853    Clinical Impression Statement Today's skilled session continued to address strengthening and balance. Pt is making steady progress toward goals and should benefit from continued PT to progress toward unmet goals.    Rehab Potential Good   PT Frequency 2x / week   PT Duration 6 weeks   PT Treatment/Interventions Patient/family education;DME Instruction;ADLs/Self Care Home Management;Gait training;Stair training;Functional mobility training;Therapeutic activities;Therapeutic exercise;Balance training;Neuromuscular re-education;Scar mobilization;Taping   PT Next Visit Plan continue core strength, balance work and gait on unlevel surfaces   Consulted and Agree with Plan of Care Patient      Patient will benefit from skilled therapeutic intervention in order to improve the following deficits and impairments:  Abnormal gait, Decreased activity tolerance, Decreased balance, Decreased mobility, Decreased endurance, Decreased knowledge of use of DME, Decreased strength, Decreased safety awareness  Visit Diagnosis: Muscle weakness  (generalized)  Unsteadiness on feet  Other abnormalities of gait and mobility     Problem List Patient Active Problem List   Diagnosis Date Noted  . Peripheral polyneuropathy ([place]) 07/20/2016  . DVT of deep femoral vein ([place]) 04/10/2016  . Cerebrovascular accident (CVA) due to embolism of cerebral artery ([place])   . Expressive aphasia 04/09/2016  . Anemia 04/09/2016  . Hypocalcemia 04/09/2016  . CVA (cerebral infarction) 04/08/2016  . 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 Thalamic infarct, acute ([place]) 02/01/2016  . 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 Stroke ([place]) 01/31/2016  . Stroke-like symptoms 01/31/2016  . Hyperglycemia 01/31/2016  . Diabetes mellitus without complication ([place])   . Hypertension   . Obesity     Willow Ora, PTA, [facility] [address] [place],  [address] [phone] 08/28/16, 3:08 PM   Name: [name] [name] MRN: [id number] Date of Birth: March 17, 1951

## 2016-09-01 ENCOUNTER — Encounter: Payer: Self-pay | Admitting: Physical Therapy

## 2016-09-01 ENCOUNTER — Ambulatory Visit: Payer: Medicare Other | Admitting: Physical Therapy

## 2016-09-01 DIAGNOSIS — R2689 Other abnormalities of gait and mobility: Secondary | ICD-10-CM | POA: Diagnosis not present

## 2016-09-01 DIAGNOSIS — R2681 Unsteadiness on feet: Secondary | ICD-10-CM

## 2016-09-01 DIAGNOSIS — M6281 Muscle weakness (generalized): Secondary | ICD-10-CM | POA: Diagnosis not present

## 2016-09-01 NOTE — Therapy (Signed)
[facility] [address] [place], [place], [address] Phone: [phone]   Fax:  [phone]  Physical Therapy Treatment  Patient Details  Name: [name] [name] MRN: [id number] Date of Birth: 12-Jun-1951 Referring Provider: Jaynee Eagles  Encounter Date: 09/01/2016      PT End of Session - 09/01/16 0851    Visit Number 9   Number of Visits 13   Date for PT Re-Evaluation 09/25/16   Authorization Type Medicare, G-Code every 10th visit   PT Start Time [phone]   PT Stop Time 0930   PT Time Calculation (min) 43 min   Equipment Utilized During Treatment Gait belt   Activity Tolerance Patient tolerated treatment well   Behavior During Therapy [facility] for tasks assessed/performed      Past Medical History:  Diagnosis Date  . Arthritis    "knees, hips, possibly back" (04/08/2016)  . Chronic bronchitis ([place])    "as a child; haven't had it in years" (04/08/2016)  . Chronic lower back pain    spondylosis  . Daily headache    "short ones for the last month or 2" (04/08/2016)  . GERD (gastroesophageal reflux disease)    takes Protonix daily "just to protect my stomach; not for reflux" (04/08/2016)  . Heart murmur    "dx'd by 1 anesthesiologist years and years ago"  . Hyperlipidemia    takes Crestor daily  . Hypertension    takes Valsartan-HCTZ daily  . Joint pain   . Macular degeneration    "just watching"  . Nocturia   . Peripheral edema   . Peripheral neuropathy (HCC)    not on any meds  . Sleep apnea    no CPAP since weight loss after bariatric surgery '12 (04/08/2016)  . Spondylosis   . Stroke ([place]) 04/07/2016   "little numbness in pinky of left hand" (04/08/2016)  . TIA (transient ischemic attack) 01/2016 X 2   Numbness on right mouth/gums/lips; & numbness in 3rd, 4th, & 5th right digits since" (04/08/2016)  . Type 2 diabetes, diet controlled ([place])   . Urinary frequency   . Urinary incontinence   . Urinary urgency     Past  Surgical History:  Procedure Laterality Date  . CATARACT EXTRACTION W/ INTRAOCULAR LENS  IMPLANT, BILATERAL Bilateral ~ 2012  . COLONOSCOPY    . EP IMPLANTABLE DEVICE N/A 04/10/2016   Procedure: Loop Recorder Insertion;  Surgeon: Will Meredith Leeds, MD;  Location: [facility];  Service: Cardiovascular;  Laterality: N/A;  . ESOPHAGOGASTRODUODENOSCOPY     in the 70's  . JOINT REPLACEMENT    . 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 SINUS ENDO W/FUSION  02/28/2016   Procedure: ENDOSCOPIC SINUS SURGERY WITH NAVIGATION;  Surgeon: Ruby Cola, MD;  Location: [facility];  Service: ENT;;  . TEE WITHOUT CARDIOVERSION N/A 04/10/2016   Procedure: TRANSESOPHAGEAL ECHOCARDIOGRAM (TEE) POSSIBLE LOOP;  Surgeon: Sanda Klein, MD;  Location: [facility];  Service: Cardiovascular;  Laterality: N/A;  . TONSILLECTOMY  1957  . TOTAL HIP ARTHROPLASTY Right 2008  . TOTAL HIP REVISION Right 03/30/2016   Procedure: RIGHT TOTAL HIP REVISION;  Surgeon: Frederik Pear, MD;  Location: [facility];  Service: Orthopedics;  Laterality: Right;  . TOTAL KNEE ARTHROPLASTY Bilateral 2001  . VAGINAL HYSTERECTOMY     "left my ovaries"    There were no vitals filed for this visit.      Subjective Assessment - 09/01/16 0850    Subjective No new complaints. No falls  to report. Hip is hurting today, 'at least a 2/10".   Pertinent History Patient reports history of neuopathy and three recent strokes.  Not sure if the strokes are impacting mobility as much as neuropathy and hip revision surgery in July.  Had stroke week after surgery, then had hematoma of hip two weeks after surgery which stopped therapy.   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   Limitations Walking   Patient Stated Goals To improve balance, feel more confident walking in community.   Currently in Pain? Yes   Pain Score 2    Pain Location Back   Pain Orientation Right   Pain Descriptors / Indicators Aching;Discomfort   Pain Type Chronic pain   Pain  Radiating Towards into right hip   Pain Onset More than a month ago   Pain Frequency Intermittent   Aggravating Factors  walking, sitting too long   Pain Relieving Factors rest, changing positions.            [facility] Adult PT Treatment/Exercise - 09/01/16 0852      Ambulation/Gait   Ambulation/Gait Yes   Ambulation/Gait Assistance 4: Min guard   Assistive device None   Gait Pattern Step-through pattern;Trendelenburg;Lateral hip instability;Lateral trunk lean to right;Wide base of support   Ambulation Surface Level;Indoor   Gait Comments along an ~50 foot hallway: forward gait with head movements left<>right and up<>down x 4 laps each with min guard assist.      High Level Balance   High Level Balance Activities Side stepping;Marching forwards;Marching backwards;Tandem walking  tandem fwd/bwd, toe walking fwd/bwd, heel walking fwd/bwd   High Level Balance Comments at counter top: side stepping with red band around knees x 3 laps each way on floor; on red mats: high knee marching fwd/bwd, tandem walking fwd/bwd, toe walking fwd/bwd and heel walking fwd/bwd x 3 laps each with none to single UE support on counter top. min guard to min assist for balance with cues on posture, weight shifting and ex form.                            Lumbar Exercises: Supine   Clam 10 reps;5 seconds;Limitations   Clam Limitations using green band, 2 sets with 5 sec holds each rep     Knee/Hip Exercises: Supine   Bridges AROM;Strengthening;10 reps   Bridges Limitations bil leg bridging- 5 sec holds with cues on hold time and ex form/technique; right single leg bridge (left leg propped on stool), 5 sec holds with cues on hold times and ex form.    Single Leg Bridge AROM;Strengthening;Right;2 sets;10 reps;Limitations             Balance Exercises - 09/01/16 0924      Balance Exercises: Standing   SLS with Vectors Foam/compliant surface;Other reps (comment);Limitations   Balance Beam standing across  blue foam balance beam: alternating fwd stepping to floor and back onto foam beam x 10 reps each leg with min/mod HHA for balance, cues on weight shifting and ex form/technique.     Balance Exercises: Standing   SLS with Vectors Limitations 2 tall cones on red mat: alternating fwd toe taps, cross toe taps, fwd double toe taps and double toe taps to each with single UE support on counter. min guard to min assist for balance with cues on posture and weight shifting.               PT Long Term  Goals - 08/03/16 1330      PT LONG TERM GOAL #1   Title Patient will demonstrate decreased fall risk with Berg score at least 50/56   Baseline target 09/25/16   Time 6   Period Weeks   Status New     PT LONG TERM GOAL #2   Title Patient to demonstrate gait on uneven surfaces including curbs mod I with cane.    Baseline target 09/25/16   Time 6   Period Weeks   Status New     PT LONG TERM GOAL #3   Title Patient to be independent with HEP for strength, balance and conditioning (walking program).   Baseline target 09/25/16   Time 6   Period Weeks   Status New     PT LONG TERM GOAL #4   Title Patient to demonstrate gait with improved efficiency via speed at 1.1 m/s or greater.    Baseline target 09/25/16   Time 6   Period Weeks   Status New     PT LONG TERM GOAL #5   Title Patient to verbalize method for fall recovery that improves safety with regards to hip precautions   Baseline target 09/25/16   Time 6   Period Weeks   Status New           Plan - 09/01/16 1735    Clinical Impression Statement Today's skilled session continued to focus on LE strengthening and high level balance activities. No increased in pain was reported. Pt is making steady progress toward goals and should benefit from continued PT to progress toward unmet goals.    Rehab Potential Good   PT Frequency 2x / week   PT Duration 6 weeks   PT Treatment/Interventions Patient/family education;DME Instruction;ADLs/Self  Care Home Management;Gait training;Stair training;Functional mobility training;Therapeutic activities;Therapeutic exercise;Balance training;Neuromuscular re-education;Scar mobilization;Taping   PT Next Visit Plan g-code next visit; continue core strength, balance work and gait on unlevel surfaces   Consulted and Agree with Plan of Care Patient      Patient will benefit from skilled therapeutic intervention in order to improve the following deficits and impairments:  Abnormal gait, Decreased activity tolerance, Decreased balance, Decreased mobility, Decreased endurance, Decreased knowledge of use of DME, Decreased strength, Decreased safety awareness  Visit Diagnosis: Muscle weakness (generalized)  Unsteadiness on feet  Other abnormalities of gait and mobility     Problem List Patient Active Problem List   Diagnosis Date Noted  . Peripheral polyneuropathy ([place]) 07/20/2016  . DVT of deep femoral vein ([place]) 04/10/2016  . Cerebrovascular accident (CVA) due to embolism of cerebral artery ([place])   . Expressive aphasia 04/09/2016  . Anemia 04/09/2016  . Hypocalcemia 04/09/2016  . CVA (cerebral infarction) 04/08/2016  . 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 Thalamic infarct, acute ([place]) 02/01/2016  . 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 Stroke ([place]) 01/31/2016  . Stroke-like symptoms 01/31/2016  . Hyperglycemia 01/31/2016  . Diabetes mellitus without complication ([place])   . Hypertension   . Obesity     Willow Ora, PTA, [facility] [address], [place], [place] [address] [phone] 09/01/16, 10:01 AM   Name: [name] [name] MRN: [id number] Date of Birth: 1951-09-15

## 2016-09-04 ENCOUNTER — Ambulatory Visit: Payer: Medicare Other | Admitting: Physical Therapy

## 2016-09-04 ENCOUNTER — Encounter: Payer: Self-pay | Admitting: Physical Therapy

## 2016-09-04 DIAGNOSIS — R2689 Other abnormalities of gait and mobility: Secondary | ICD-10-CM

## 2016-09-04 DIAGNOSIS — M6281 Muscle weakness (generalized): Secondary | ICD-10-CM | POA: Diagnosis not present

## 2016-09-04 DIAGNOSIS — R2681 Unsteadiness on feet: Secondary | ICD-10-CM

## 2016-09-04 NOTE — Therapy (Signed)
[facility] [address] [place], [place], [address] Phone: [phone]   Fax:  [phone]  Physical Therapy Treatment  Patient Details  Name: [name] [name] MRN: [id number] Date of Birth: 1951-04-29 Referring Provider: Jaynee Eagles  Encounter Date: 09/04/2016      PT End of Session - 09/04/16 0938    Visit Number 10   Number of Visits 13   Date for PT Re-Evaluation 09/25/16   Authorization Type Medicare, G-Code every 10th visit   PT Start Time 0933   PT Stop Time 1015   PT Time Calculation (min) 42 min   Equipment Utilized During Treatment Gait belt   Activity Tolerance Patient tolerated treatment well   Behavior During Therapy [facility] for tasks assessed/performed      Past Medical History:  Diagnosis Date  . Arthritis    "knees, hips, possibly back" (04/08/2016)  . Chronic bronchitis ([place])    "as a child; haven't had it in years" (04/08/2016)  . Chronic lower back pain    spondylosis  . Daily headache    "short ones for the last month or 2" (04/08/2016)  . GERD (gastroesophageal reflux disease)    takes Protonix daily "just to protect my stomach; not for reflux" (04/08/2016)  . Heart murmur    "dx'd by 1 anesthesiologist years and years ago"  . Hyperlipidemia    takes Crestor daily  . Hypertension    takes Valsartan-HCTZ daily  . Joint pain   . Macular degeneration    "just watching"  . Nocturia   . Peripheral edema   . Peripheral neuropathy (HCC)    not on any meds  . Sleep apnea    no CPAP since weight loss after bariatric surgery '12 (04/08/2016)  . Spondylosis   . Stroke ([place]) 04/07/2016   "little numbness in pinky of left hand" (04/08/2016)  . TIA (transient ischemic attack) 01/2016 X 2   Numbness on right mouth/gums/lips; & numbness in 3rd, 4th, & 5th right digits since" (04/08/2016)  . Type 2 diabetes, diet controlled ([place])   . Urinary frequency   . Urinary incontinence   . Urinary urgency     Past  Surgical History:  Procedure Laterality Date  . CATARACT EXTRACTION W/ INTRAOCULAR LENS  IMPLANT, BILATERAL Bilateral ~ 2012  . COLONOSCOPY    . EP IMPLANTABLE DEVICE N/A 04/10/2016   Procedure: Loop Recorder Insertion;  Surgeon: Will Meredith Leeds, MD;  Location: [facility];  Service: Cardiovascular;  Laterality: N/A;  . ESOPHAGOGASTRODUODENOSCOPY     in the 70's  . JOINT REPLACEMENT    . 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 SINUS ENDO W/FUSION  02/28/2016   Procedure: ENDOSCOPIC SINUS SURGERY WITH NAVIGATION;  Surgeon: Ruby Cola, MD;  Location: [facility];  Service: ENT;;  . TEE WITHOUT CARDIOVERSION N/A 04/10/2016   Procedure: TRANSESOPHAGEAL ECHOCARDIOGRAM (TEE) POSSIBLE LOOP;  Surgeon: Sanda Klein, MD;  Location: [facility];  Service: Cardiovascular;  Laterality: N/A;  . TONSILLECTOMY  1957  . TOTAL HIP ARTHROPLASTY Right 2008  . TOTAL HIP REVISION Right 03/30/2016   Procedure: RIGHT TOTAL HIP REVISION;  Surgeon: Frederik Pear, MD;  Location: [facility];  Service: Orthopedics;  Laterality: Right;  . TOTAL KNEE ARTHROPLASTY Bilateral 2001  . VAGINAL HYSTERECTOMY     "left my ovaries"    There were no vitals filed for this visit.      Subjective Assessment - 09/04/16 0937    Subjective No new complaints. No falls  to report. Lower back is hurting today.   Pertinent History Patient reports history of neuopathy and three recent strokes.  Not sure if the strokes are impacting mobility as much as neuropathy and hip revision surgery in July.  Had stroke week after surgery, then had hematoma of hip two weeks after surgery which stopped therapy.   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   Limitations Walking   Patient Stated Goals To improve balance, feel more confident walking in community.   Currently in Pain? Yes   Pain Score 3    Pain Location Back   Pain Orientation Lower   Pain Descriptors / Indicators Sore;Tightness   Pain Type Chronic pain   Pain Onset More than a  month ago   Pain Frequency Intermittent   Aggravating Factors  sitting too long   Pain Relieving Factors rest, changing positions            [facility] PT Assessment - 09/04/16 0940      Ambulation/Gait   Ambulation/Gait Yes   Ambulation/Gait Assistance 5: Supervision   Ambulation Distance (Feet) 450 Feet   Assistive device None   Gait Pattern Step-through pattern;Trendelenburg;Lateral hip instability;Lateral trunk lean to right;Wide base of support  increased lateral upper trunk movements as well   Ambulation Surface Level;Indoor   Gait velocity 9.75 sec's= 3.36 ft/sec no AD     Berg Balance Test   Sit to Stand Able to stand without using hands and stabilize independently   Standing Unsupported Able to stand safely 2 minutes   Sitting with Back Unsupported but Feet Supported on Floor or Stool Able to sit safely and securely 2 minutes   Stand to Sit Sits safely with minimal use of hands   Transfers Able to transfer safely, minor use of hands   Standing Unsupported with Eyes Closed Able to stand 10 seconds safely   Standing Ubsupported with Feet Together Able to place feet together independently and stand 1 minute safely   From Standing, Reach Forward with Outstretched Arm Can reach forward >12 cm safely (5")  8 inches   From Standing Position, Pick up Object from Floor Able to pick up shoe safely and easily   From Standing Position, Turn to Look Behind Over each Shoulder Looks behind from both sides and weight shifts well   Turn 360 Degrees Able to turn 360 degrees safely in 4 seconds or less   Standing Unsupported, Alternately Place Feet on Step/Stool Able to stand independently and safely and complete 8 steps in 20 seconds  12.63 sec's   Standing Unsupported, One Foot in Front Able to plae foot ahead of the other independently and hold 30 seconds   Standing on One Leg Tries to lift leg/unable to hold 3 seconds but remains standing independently   Total Score 51   Berg comment:  51/56= moderate risk of falling (>50%)     Timed Up and Go Test   TUG Normal TUG   Normal TUG (seconds) 8.82  no AD           [facility] Adult PT Treatment/Exercise - 09/04/16 0940      Transfers   Floor to Transfer With upper extremity assist   Comments reviewed floor transfers with pt while adhering to total hip precautions with demo for pt. Pt declined to practice. Also discussed use of fire department non emergency number if she is uanble to safely get herself up.  PT Long Term Goals - 2016-10-02 0959      PT LONG TERM GOAL #1   Title Patient will demonstrate decreased fall risk with Berg score at least 50/56   Baseline 10-02-16: 51/56 scored today   Status Achieved     PT LONG TERM GOAL #2   Title Patient to demonstrate gait on uneven surfaces including curbs mod I with cane.    Baseline target 09/25/16   Time 6   Period Weeks   Status New     PT LONG TERM GOAL #3   Title Patient to be independent with HEP for strength, balance and conditioning (walking program).   Baseline target 09/25/16   Time 6   Period Weeks   Status New     PT LONG TERM GOAL #4   Title Patient to demonstrate gait with improved efficiency via speed at 1.1 m/s or greater.    Baseline 10/02/16: 3.  ft/sec with no AD   Status Achieved     PT LONG TERM GOAL #5   Title Patient to verbalize method for fall recovery that improves safety with regards to hip precautions   Baseline target 09/25/16   Time 6   Period Weeks   Status New            Plan - 02-Oct-2016 3614    Clinical Impression Statement Pt has shown great progress with most of her LTGs met to date. Pt is progressing toward unmet goals as well and should be ready for discharge next week per PT plan of care.   Rehab Potential Good   PT Frequency 2x / week   PT Duration 6 weeks   PT Treatment/Interventions Patient/family education;DME Instruction;ADLs/Self Care Home Management;Gait training;Stair  training;Functional mobility training;Therapeutic activities;Therapeutic exercise;Balance training;Neuromuscular re-education;Scar mobilization;Taping   PT Next Visit Plan assess remaining LTGs and discharge over next 1-2 sessions (plan of care ends next week, pt hopeful to discharge on 1st visit next week, will discuss with PT)   Consulted and Agree with Plan of Care Patient      Patient will benefit from skilled therapeutic intervention in order to improve the following deficits and impairments:  Abnormal gait, Decreased activity tolerance, Decreased balance, Decreased mobility, Decreased endurance, Decreased knowledge of use of DME, Decreased strength, Decreased safety awareness  Visit Diagnosis: Muscle weakness (generalized)  Unsteadiness on feet  Other abnormalities of gait and mobility       G-Codes - 10-02-2016 1317    Functional Assessment Tool Used Berg 51/56, gait velocity 3.36 ft/sec (1.02 m/s)   Functional Limitation Mobility: Walking and moving around   Mobility: Walking and Moving Around Current Status [phone]) At least 1 percent but less than 20 percent impaired, limited or restricted   Mobility: Walking and Moving Around Goal Status [phone]) At least 1 percent but less than 20 percent impaired, limited or restricted      Problem List Patient Active Problem List   Diagnosis Date Noted  . Peripheral polyneuropathy ([place]) 07/20/2016  . DVT of deep femoral vein ([place]) 04/10/2016  . Cerebrovascular accident (CVA) due to embolism of cerebral artery ([place])   . Expressive aphasia 04/09/2016  . Anemia 04/09/2016  . Hypocalcemia 04/09/2016  . CVA (cerebral infarction) 04/08/2016  . 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 Thalamic infarct, acute ([place]) 02/01/2016  . 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 Stroke ([place]) 01/31/2016  . Stroke-like symptoms 01/31/2016  .  Hyperglycemia 01/31/2016   . Diabetes mellitus without complication ([place])   . Hypertension   . Obesity     Willow Ora, PTA, [facility] [address] [place], [place] [address] [phone] 09/04/16, 1:20 PM   Name: [name] [name] MRN: [id number] Date of Birth: 04-Dec-1950  Physical Therapy Progress Note  Dates of Reporting Period: 08/03/16 to 09/04/16  Objective Reports of Subjective Statement: Improved Berg, Gait velocity  Objective Measurements: Merrilee Jansky 51/56, Gait velocity 3.36 ft/sec (1.02 m/sec)  Goal Update: Pt has met LTG 1 and 4 and appears on target to meet remaining LTGs.  Plan: Plan to check remaining goals with plans for discharge in next 1-2 weeks.  Reason Skilled Services are Required: Improved functional mobility, with continued need for skilled PT to address dynamic balance and gait.  Mady Haagensen, PT 09/04/16 1:22 PM Phone: [phone] Fax: [phone]

## 2016-09-07 ENCOUNTER — Ambulatory Visit: Payer: Medicare Other | Admitting: Physical Therapy

## 2016-09-07 ENCOUNTER — Ambulatory Visit (INDEPENDENT_AMBULATORY_CARE_PROVIDER_SITE_OTHER): Payer: Medicare Other | Admitting: *Deleted

## 2016-09-07 ENCOUNTER — Encounter: Payer: Self-pay | Admitting: Physical Therapy

## 2016-09-07 DIAGNOSIS — R2681 Unsteadiness on feet: Secondary | ICD-10-CM | POA: Diagnosis not present

## 2016-09-07 DIAGNOSIS — R2689 Other abnormalities of gait and mobility: Secondary | ICD-10-CM

## 2016-09-07 DIAGNOSIS — M6281 Muscle weakness (generalized): Secondary | ICD-10-CM | POA: Diagnosis not present

## 2016-09-07 DIAGNOSIS — I6340 Cerebral infarction due to embolism of unspecified cerebral artery: Secondary | ICD-10-CM | POA: Diagnosis not present

## 2016-09-07 NOTE — Therapy (Addendum)
[facility] [address] [place], [place], [address] Phone: [phone]   Fax:  [phone]  Physical Therapy Treatment  Patient Details  Name: [name] [name] MRN: [id number] Date of Birth: [birth year] Referring Provider: Jaynee Eagles  Encounter Date: 09/07/2016      PT End of Session - 09/07/16 0936    Visit Number 11   Number of Visits 13   Date for PT Re-Evaluation 09/25/16   Authorization Type Medicare, G-Code every 10th visit   PT Start Time 0932   PT Stop Time 1005  discharge visit-not all time needed   PT Time Calculation (min) 33 min   Equipment Utilized During Treatment Gait belt   Activity Tolerance Patient tolerated treatment well   Behavior During Therapy [facility] for tasks assessed/performed      Past Medical History:  Diagnosis Date  . Arthritis    "knees, hips, possibly back" (04/08/2016)  . Chronic bronchitis ([place])    "as a child; haven't had it in years" (04/08/2016)  . Chronic lower back pain    spondylosis  . Daily headache    "short ones for the last month or 2" (04/08/2016)  . GERD (gastroesophageal reflux disease)    takes Protonix daily "just to protect my stomach; not for reflux" (04/08/2016)  . Heart murmur    "dx'd by 1 anesthesiologist years and years ago"  . Hyperlipidemia    takes Crestor daily  . Hypertension    takes Valsartan-HCTZ daily  . Joint pain   . Macular degeneration    "just watching"  . Nocturia   . Peripheral edema   . Peripheral neuropathy (HCC)    not on any meds  . Sleep apnea    no CPAP since weight loss after bariatric surgery '12 (04/08/2016)  . Spondylosis   . Stroke ([place]) 04/07/2016   "little numbness in pinky of left hand" (04/08/2016)  . TIA (transient ischemic attack) 01/2016 X 2   Numbness on right mouth/gums/lips; & numbness in 3rd, 4th, & 5th right digits since" (04/08/2016)  . Type 2 diabetes, diet controlled ([place])   . Urinary frequency   . Urinary  incontinence   . Urinary urgency     Past Surgical History:  Procedure Laterality Date  . CATARACT EXTRACTION W/ INTRAOCULAR LENS  IMPLANT, BILATERAL Bilateral ~ 2012  . COLONOSCOPY    . EP IMPLANTABLE DEVICE N/A 04/10/2016   Procedure: Loop Recorder Insertion;  Surgeon: Will Meredith Leeds, MD;  Location: [facility];  Service: Cardiovascular;  Laterality: N/A;  . ESOPHAGOGASTRODUODENOSCOPY     in the 70's  . JOINT REPLACEMENT    . 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 SINUS ENDO W/FUSION  02/28/2016   Procedure: ENDOSCOPIC SINUS SURGERY WITH NAVIGATION;  Surgeon: Ruby Cola, MD;  Location: [facility];  Service: ENT;;  . TEE WITHOUT CARDIOVERSION N/A 04/10/2016   Procedure: TRANSESOPHAGEAL ECHOCARDIOGRAM (TEE) POSSIBLE LOOP;  Surgeon: Sanda Klein, MD;  Location: [facility];  Service: Cardiovascular;  Laterality: N/A;  . TONSILLECTOMY  1957  . TOTAL HIP ARTHROPLASTY Right 2008  . TOTAL HIP REVISION Right 03/30/2016   Procedure: RIGHT TOTAL HIP REVISION;  Surgeon: Frederik Pear, MD;  Location: [facility];  Service: Orthopedics;  Laterality: Right;  . TOTAL KNEE ARTHROPLASTY Bilateral 2001  . VAGINAL HYSTERECTOMY     "left my ovaries"    There were no vitals filed for this visit.      Subjective Assessment - 09/07/16 0935  Subjective No new complaints. No falls to report. No pain at this time.   Pertinent History Patient reports history of neuopathy and three recent strokes.  Not sure if the strokes are impacting mobility as much as neuropathy and hip revision surgery in July.  Had stroke week after surgery, then had hematoma of hip two weeks after surgery which stopped therapy.   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   Limitations Walking   Patient Stated Goals To improve balance, feel more confident walking in community.   Currently in Pain? No/denies   Pain Score 0-No pain             [facility] Adult PT Treatment/Exercise - 09/07/16 0941      Transfers    Transfers Sit to Stand;Stand to Sit   Sit to Stand 7: Independent   Stand to Sit 7: Independent     Ambulation/Gait   Ambulation/Gait Yes   Ambulation/Gait Assistance 6: Modified independent (Device/Increase time)   Ambulation/Gait Assistance Details no balance issues noted.    Ambulation Distance (Feet) 350 Feet   Assistive device None   Gait Pattern Step-through pattern;Trendelenburg;Lateral hip instability;Lateral trunk lean to right;Wide base of support   Ambulation Surface Level;Unlevel;Indoor;Outdoor;Paved;Gravel;Grass   Ramp 6: Modified independent (Device)  no AD used   Curb 5: Supervision;6: Modified independent (Device/increase time)  mod I descending, supervision with ascending no AD   Curb Details (indicate cue type and reason) mod I with cane both ascending and descending     self care: Reviewed all exercises issued to date for HEP. Pt able to perform most without cues needed. Only needed cues with core exercise, "dead bug" for abdominal activation prior to performing exercise. Discussed benefits of joining [employer] silver sneakers again: which machines to use for cardiovascular and strengthening benefits. Pt verbalized understanding and plans to join in new year after holiday's due to busy schedule between now and then.          PT Long Term Goals - 09/07/16 0937      PT LONG TERM GOAL #1   Title Patient will demonstrate decreased fall risk with Berg score at least 50/56   Baseline 09/04/16: 51/56 scored today   Status Achieved     PT LONG TERM GOAL #2   Title Patient to demonstrate gait on uneven surfaces including curbs mod I with cane.    Baseline 09/07/16: met today with no AD   Time --   Period --   Status Achieved     PT LONG TERM GOAL #3   Title Patient to be independent with HEP for strength, balance and conditioning (walking program).   Baseline 09/07/16: met with current HEP. Pt also educated on how to advance current exericises as they get easier. Pt  also plans to rejoin gym at beginning of new year.                          Time --   Period --   Status Achieved     PT LONG TERM GOAL #4   Title Patient to demonstrate gait with improved efficiency via speed at 1.1 m/s or greater.    Baseline 09/04/16: 3.36  ft/sec with no AD   Status Achieved     PT LONG TERM GOAL #5   Title Patient to verbalize method for fall recovery that improves safety with regards to hip precautions   Baseline 09/04/16: demo'd technique, pt able to  verbalize back. declined practice in session.   Time --   Period --   Status Achieved               Plan - 2016/10/05 0937    Clinical Impression Statement Pt met remaining LTGs today. Discussed progress with primary PT prior to session who agreed to early discharge if all goals met. Pt agreeable to discharge today. Pt without any questions at end of session.   Rehab Potential Good   PT Frequency 2x / week   PT Duration 6 weeks   PT Treatment/Interventions Patient/family education;DME Instruction;ADLs/Self Care Home Management;Gait training;Stair training;Functional mobility training;Therapeutic activities;Therapeutic exercise;Balance training;Neuromuscular re-education;Scar mobilization;Taping   PT Next Visit Plan discharge with all goals met.    Consulted and Agree with Plan of Care Patient      Patient will benefit from skilled therapeutic intervention in order to improve the following deficits and impairments:  Abnormal gait, Decreased activity tolerance, Decreased balance, Decreased mobility, Decreased endurance, Decreased knowledge of use of DME, Decreased strength, Decreased safety awareness  Visit Diagnosis: Muscle weakness (generalized)  Unsteadiness on feet  Other abnormalities of gait and mobility       G-Codes - 10/05/16 1015    Functional Assessment Tool Used Berg 51/56, gait velocity 3.36 ft/sec (1.02 m/s)   Functional Limitation Mobility: Walking and moving around       Last PT [facility] from 05-Oct-2016 in [place], gait velocity 3.36 ft/sec (1.02 m/s)  Functional Limitation Mobility: Walking and moving around  Mobility: Walking and Moving Around Current Status [phone]) No data  Mobility: Walking and Moving Around Goal Status ([id number]) CI  Mobility: Walking and Moving Around Discharge Status [phone]) CI     Problem List Patient Active Problem List   Diagnosis Date Noted  . Peripheral polyneuropathy ([place]) 07/20/2016  . DVT of deep femoral vein ([place]) 04/10/2016  . Cerebrovascular accident (CVA) due to embolism of cerebral artery ([place])   . Expressive aphasia 04/09/2016  . Anemia 04/09/2016  . Hypocalcemia 04/09/2016  . CVA (cerebral infarction) 04/08/2016  . 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 Thalamic infarct, acute ([place]) 02/01/2016  . 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 Stroke ([place]) 01/31/2016  . Stroke-like symptoms 01/31/2016  . Hyperglycemia 01/31/2016  . Diabetes mellitus without complication ([place])   . Hypertension   . Obesity     [place], [place], [place] [address] [place], [place] [address] [phone] 05-Oct-2016, 10:15 AM   Name: CLORISSA GRUENBERG MRN: [id number] Date of Birth: 04/05/1951   PHYSICAL THERAPY DISCHARGE SUMMARY  Visits from Start of Care: 10  Current functional level related to goals / functional outcomes: See above     Remaining deficits: trendelenberg gait   Education / Equipment: HEP, falls education, stroke education Plan: Patient agrees to discharge.  Patient goals were met. Patient is being discharged due to meeting the stated rehab goals.  ?????   Carroll, Roxie 09/09/2016

## 2016-09-08 DIAGNOSIS — M25551 Pain in right hip: Secondary | ICD-10-CM | POA: Diagnosis not present

## 2016-09-08 NOTE — Progress Notes (Signed)
Carelink Summary Report / Loop Recorder 

## 2016-09-10 ENCOUNTER — Ambulatory Visit: Payer: Medicare Other | Admitting: Physical Therapy

## 2016-09-19 NOTE — Progress Notes (Signed)
Carelink summary report received. Battery status OK. Normal device function. No new symptom episodes, tachy episodes, brady, or pause episodes. No new AF episodes. Monthly summary reports and ROV/PRN

## 2016-09-23 DIAGNOSIS — Z6841 Body Mass Index (BMI) 40.0 and over, adult: Secondary | ICD-10-CM | POA: Diagnosis not present

## 2016-09-23 DIAGNOSIS — D352 Benign neoplasm of pituitary gland: Secondary | ICD-10-CM | POA: Diagnosis not present

## 2016-09-23 DIAGNOSIS — R030 Elevated blood-pressure reading, without diagnosis of hypertension: Secondary | ICD-10-CM | POA: Diagnosis not present

## 2016-09-24 ENCOUNTER — Encounter: Payer: Self-pay | Admitting: Neurology

## 2016-09-24 ENCOUNTER — Ambulatory Visit (INDEPENDENT_AMBULATORY_CARE_PROVIDER_SITE_OTHER): Payer: Medicare Other | Admitting: Physician Assistant

## 2016-09-24 ENCOUNTER — Encounter: Payer: Self-pay | Admitting: Physician Assistant

## 2016-09-24 VITALS — BP 160/90 | HR 88 | Ht 63.0 in | Wt 248.0 lb

## 2016-09-24 DIAGNOSIS — I10 Essential (primary) hypertension: Secondary | ICD-10-CM

## 2016-09-24 DIAGNOSIS — R011 Cardiac murmur, unspecified: Secondary | ICD-10-CM

## 2016-09-24 DIAGNOSIS — Z86718 Personal history of other venous thrombosis and embolism: Secondary | ICD-10-CM

## 2016-09-24 DIAGNOSIS — I253 Aneurysm of heart: Secondary | ICD-10-CM

## 2016-09-24 DIAGNOSIS — I639 Cerebral infarction, unspecified: Secondary | ICD-10-CM

## 2016-09-24 NOTE — Patient Instructions (Addendum)
Medication Instructions:  Your physician recommends that you continue on your current medications as directed. Please refer to the Current Medication list given to you today.  Labwork: NONE  Testing/Procedures: Your physician has requested that you have an echocardiogram. Echocardiography is a painless test that uses sound waves to create images of your heart. It provides your doctor with information about the size and shape of your heart and how well your heart's chambers and valves are working. This procedure takes approximately one hour. There are no restrictions for this procedure.  Follow-Up: Your physician wants you to follow-up after echocardiogram with Melina Copa PA.  Your physician wants you to call neurology today and review your elevated BP.    If you need a refill on your cardiac medications before your next appointment, please call your pharmacy.

## 2016-09-24 NOTE — Progress Notes (Signed)
Cardiology Office Note    Date:  09/24/2016  ID:  [name] [name], DOB 1950/12/01, MRN [id number] PCP:  [name] Nip, MD  Cardiologist:  Seen by Dr. Curt Bears with EP during 03/2016 admission   Chief Complaint: murmur  History of Present Illness:  [name] [name] is a 66 y.o. female with history of HTN, HLD, OSA (no CPAP after bariatric surgery), obesity, recurrent stroke (03/2016 prompting ILR, residual parasthesias of lips and fingers on right hand), macular degeneration, chronic low back pain, DM, arthritis, heart murmur, pituitary macroadenoma s/p surgrey, anemia, B12 deficiency who presents for evaluation of murmur. She was found to have stroke in 01/2016 with w/u notable for carotid duplex 1-39% stenosis, 2D echo 01/2016: mod LVH, EF 66-50%, moderately calcified AV leaflets without stenosis, mildly calcified mitral annulus. She wore an event monitor which showed no atrial fib. She was readmitted for recurrent stroke 03/2016 at which time EP placed a loop recorder. TEE at that time 04/10/16 showed EF 55-60%, no RWMA, + atrial septal aneurysm, no evidence of thrombus, no PFO. She was noted to have an acute DVT in the mid left femoral vein. Transcranial doppler was negative, no PFO. She was started on Xarelto for DVT.   She recently saw her PCP who noted a heart murmur on exam. The patient states she was previously told in 2001 that she had one but she hadn't heard anything of it since. Review of cardiology hospital notes did not indicate any heart murmur. The patient has had a pretty uneventful end of the year compared to earlier this year. No chest pain, SOB, syncope. She has rare fleeting palpitations, nothing sustained or new. Pertinent labs 08/2016: Hgb 11.6, Plt 251, TSH 1.54. Her PCP inquires whether her atrial septal aneurysm carries weight with regard to recurrent CVA. She was also referred to hematology for hypercoagulable workup.   Past Medical History:  Diagnosis Date  . Arthritis    "knees, hips, possibly back" (04/08/2016)  . Chronic lower back pain    spondylosis  . Daily headache    "short ones for the last month or 2" (04/08/2016)  . GERD (gastroesophageal reflux disease)    takes Protonix daily "just to protect my stomach; not for reflux" (04/08/2016)  . Heart murmur    "dx'd by 1 anesthesiologist years and years ago"  . History of loop recorder 03/2016  . Hyperlipidemia   . Hypertension   . Joint pain   . Macular degeneration    "just watching"  . Peripheral edema   . Peripheral neuropathy (HCC)    not on any meds  . Pituitary mass ([place])   . Sleep apnea    no CPAP since weight loss after bariatric surgery '12 (04/08/2016)  . Spondylosis   . Stroke ([place]) 04/07/2016   a. 01/2016 and 03/2016. during admission had + LE DVT.  [name] Type 2 diabetes, diet controlled ([place])   . Urinary incontinence     Past Surgical History:  Procedure Laterality Date  . CATARACT EXTRACTION W/ INTRAOCULAR LENS  IMPLANT, BILATERAL Bilateral ~ 2012  . COLONOSCOPY    . EP IMPLANTABLE DEVICE N/A 04/10/2016   Procedure: Loop Recorder Insertion;  Surgeon: Will Meredith Leeds, MD;  Location: [facility];  Service: Cardiovascular;  Laterality: N/A;  . ESOPHAGOGASTRODUODENOSCOPY     in the 70's  . JOINT REPLACEMENT    . 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 SINUS ENDO W/FUSION  02/28/2016   Procedure: ENDOSCOPIC SINUS SURGERY  WITH NAVIGATION;  Surgeon: Ruby Cola, MD;  Location: [facility];  Service: ENT;;  . TEE WITHOUT CARDIOVERSION N/A 04/10/2016   Procedure: TRANSESOPHAGEAL ECHOCARDIOGRAM (TEE) POSSIBLE LOOP;  Surgeon: Sanda Klein, MD;  Location: [facility];  Service: Cardiovascular;  Laterality: N/A;  . TONSILLECTOMY  1957  . TOTAL HIP ARTHROPLASTY Right 2008  . TOTAL HIP REVISION Right 03/30/2016   Procedure: RIGHT TOTAL HIP REVISION;  Surgeon: Frederik Pear, MD;  Location: [facility];  Service: Orthopedics;  Laterality: Right;  . TOTAL KNEE ARTHROPLASTY Bilateral 2001  . VAGINAL  HYSTERECTOMY     "left my ovaries"    Current Medications: Current Outpatient Prescriptions  Medication Sig Dispense Refill  . b complex vitamins capsule Take 1 capsule by mouth daily. 30 capsule 2  . beta carotene w/minerals (OCUVITE) tablet Take 1 tablet by mouth daily.    . Coenzyme Q10 (CO Q-10) 400 MG CAPS Take by mouth.    [name] FLUZONE HIGH-DOSE 0.5 ML SUSY     . gabapentin (NEURONTIN) 300 MG capsule Take 1 capsule (300 mg total) by mouth at bedtime. 30 capsule 11  . iron polysaccharides (NIFEREX) 150 MG capsule Take 1 capsule (150 mg total) by mouth 2 (two) times daily. 60 capsule 2  . magnesium oxide (MAG-OX) 400 MG tablet Take 400 mg by mouth at bedtime.     [name] PREVNAR 13 SUSP injection     . rosuvastatin (CRESTOR) 20 MG tablet Take 1 tablet (20 mg total) by mouth daily. (Patient taking differently: Take 20 mg by mouth at bedtime. ) 30 tablet 0  . XARELTO 20 MG TABS tablet Take 20 mg by mouth daily.     No current facility-administered medications for this visit.      Allergies:   Ambien [zolpidem]; Atorvastatin; Codeine; Penicillins; Simvastatin; and Byetta 10 mcg pen [exenatide]   Social History   Social History  . Marital status: Single    Spouse name: N/A  . Number of children: 0  . Years of education: college   Occupational History  . Retired    Social History Main Topics  . Smoking status: Former Smoker    Packs/day: 1.00    Years: 40.00    Types: Cigarettes    Quit date: 09/17/2007  . Smokeless tobacco: Never Used  . Alcohol use 0.0 oz/week     Comment: 04/08/2016 "5-6 drinks/year'  . Drug use:     Types: Marijuana     Comment: 04/08/2016 "did some marijuana in college"  . Sexual activity: No   Other Topics Concern  . None   Social History Narrative   Drinks 3 cups of coffee a day      Family History:  The patient's family history includes Congestive Heart Failure in her maternal grandmother; Lung cancer in her maternal grandfather; Lymphoma in her  father; Prostate cancer in her father.   ROS:   Please see the history of present illness.  All other systems are reviewed and otherwise negative.    PHYSICAL EXAM:   VS:  BP (!) 160/90 (BP Location: Left Arm, Patient Position: Sitting, Cuff Size: Large)   Pulse 88   Ht _0  (1.6 m)   Wt 248 lb (112.5 kg)   BMI 43.93 kg/m   BMI: Body mass index is 43.93 kg/m. GEN: Well nourished, well developed obese WF in no acute distress  HEENT: normocephalic, atraumatic Neck: no JVD, carotid bruits, or masses Cardiac: RRR, soft SEM mostly RUSB also heard LSB. No rubs or  gallops, no edema  Respiratory:  clear to auscultation bilaterally, normal work of breathing GI: soft, nontender, nondistended, + BS MS: no deformity or atrophy  Skin: warm and dry, no rash Neuro:  Alert and Oriented x 3, Strength and sensation are intact, follows commands Psych: euthymic mood, full affect  Wt Readings from Last 3 Encounters:  09/24/16 248 lb (112.5 kg)  07/20/16 249 lb 12.8 oz (113.3 kg)  06/09/16 252 lb 9.6 oz (114.6 kg)      Studies/Labs Reviewed:   EKG:  EKG was ordered today and personally reviewed by me and demonstrates NSR 78bpm, occ PVCs, no acute changes  Recent Labs: 02/01/2016: TSH 2.070 05/29/2016: ALT 11; BUN 12.8; Creatinine 0.8; HGB 11.7; Platelets 233; Potassium 4.4; Sodium 141   Lipid Panel    Component Value Date/Time   CHOL 128 04/09/2016 0155   TRIG 133 04/09/2016 0155   HDL 48 04/09/2016 0155   CHOLHDL 2.7 04/09/2016 0155   VLDL 27 04/09/2016 0155   LDLCALC 53 04/09/2016 0155    Additional studies/ records that were reviewed today include: Summarized above.    ASSESSMENT & PLAN:   1. Heart murmur - this was remotely mentioned to patient but she has not heard anything of it recently until her PCP told her she had one. I suspect this is due to the calcification on her aortic valve noted on prior echo, but will proceed with updated echocardiogram since we have to assume  this murmur is new based on documentation. Hgb and TSH were stable by recent labs. 2. Cryptogenic stroke and atrial septal aneurysm - has ILR in place. Etiology not really clear. It is interesting to me that she had a DVT at the time that her 2nd stroke was diagnosed. Her PCP raises question of whether or not her atrial septal aneurysm could have contributed. I reviewed this in depth with Dr. Acie Fredrickson. An atrial septal aneurysm by itself is typically an incidental finding and does not usually contribute to issues unless associated with a PFO or shunt, neither of which the patient had by imaging. Per review of literature it appears there may be some associated risk by some studies but overall It remains uncertain whether an atrial septal aneurysm, alone or associated with a PFO, is a risk factor for initial or recurrent cryptogenic stroke. 3. History of DVT - on Xarelto, per PCP. 4. Essential HTN - BP running elevated in clinic today. Recheck was 145/92. The patient states she was explicitly told by neurology to keep her blood pressure in a range of 140-150/80-90. She states she had even inquired about resuming her ARB. However, this was several months ago. I've asked her to contact her neurologist today to ask whether she can begin to pursue stricter blood pressure control to help reduce risk of future CV events.   Disposition: F/u with me after echo. If echo is unremarkable, will d/c this appointment and have her continue to follow with EP remotely for loop.   Medication Adjustments/Labs and Tests Ordered: Current medicines are reviewed at length with the patient today.  Concerns regarding medicines are outlined above. Medication changes, Labs and Tests ordered today are summarized above and listed in the Patient Instructions accessible in Encounters.   Raechel Ache PA-C  09/24/2016 10:32 AM    [facility] [place], [place], [place]  [address] Phone: [phone]; Fax:  [phone]

## 2016-09-25 ENCOUNTER — Ambulatory Visit (HOSPITAL_COMMUNITY): Payer: Medicare Other | Attending: Cardiovascular Disease

## 2016-09-25 ENCOUNTER — Other Ambulatory Visit: Payer: Self-pay

## 2016-09-25 DIAGNOSIS — R011 Cardiac murmur, unspecified: Secondary | ICD-10-CM | POA: Diagnosis not present

## 2016-09-25 DIAGNOSIS — E785 Hyperlipidemia, unspecified: Secondary | ICD-10-CM | POA: Insufficient documentation

## 2016-09-25 DIAGNOSIS — Z6841 Body Mass Index (BMI) 40.0 and over, adult: Secondary | ICD-10-CM | POA: Diagnosis not present

## 2016-09-25 DIAGNOSIS — E119 Type 2 diabetes mellitus without complications: Secondary | ICD-10-CM | POA: Insufficient documentation

## 2016-09-25 DIAGNOSIS — I10 Essential (primary) hypertension: Secondary | ICD-10-CM | POA: Insufficient documentation

## 2016-09-25 DIAGNOSIS — Z87891 Personal history of nicotine dependence: Secondary | ICD-10-CM | POA: Insufficient documentation

## 2016-09-25 DIAGNOSIS — G4733 Obstructive sleep apnea (adult) (pediatric): Secondary | ICD-10-CM | POA: Insufficient documentation

## 2016-09-25 DIAGNOSIS — E669 Obesity, unspecified: Secondary | ICD-10-CM | POA: Insufficient documentation

## 2016-09-25 DIAGNOSIS — Z8249 Family history of ischemic heart disease and other diseases of the circulatory system: Secondary | ICD-10-CM | POA: Diagnosis not present

## 2016-09-28 ENCOUNTER — Telehealth: Payer: Self-pay | Admitting: Physician Assistant

## 2016-09-28 NOTE — Telephone Encounter (Signed)
Returned pts call and we went over her echo results and recommendations. Pt verbalized understanding and appreciation for the call.

## 2016-09-28 NOTE — Telephone Encounter (Signed)
Follow Up:     Returning call from [place] her echo results.

## 2016-09-29 ENCOUNTER — Encounter: Payer: Self-pay | Admitting: Physician Assistant

## 2016-10-01 ENCOUNTER — Telehealth: Payer: Self-pay | Admitting: Physician Assistant

## 2016-10-01 DIAGNOSIS — Z79899 Other long term (current) drug therapy: Secondary | ICD-10-CM

## 2016-10-01 MED ORDER — VALSARTAN-HYDROCHLOROTHIAZIDE 160-25 MG PO TABS: 1.0000 | ORAL_TABLET | Freq: Every day | ORAL | 3 refills | Status: DC

## 2016-10-01 MED ORDER — AMLODIPINE-VALSARTAN-HCTZ 10-160-25 MG PO TABS: 1.0000 | ORAL_TABLET | Freq: Every day | ORAL | 3 refills | Status: DC

## 2016-10-01 NOTE — Addendum Note (Signed)
Addended by: Gaetano Net on: 10/01/2016 02:04 PM   Modules accepted: Orders

## 2016-10-01 NOTE — Telephone Encounter (Signed)
Pt called the office to give her BP readings. Pt was seen by Melina Copa PA-C on 09/24/16 during that visit pt's BP was 160/90. Pt was recommended to call her  NEUROLOGIST TO MANAGED HER BP. ACCORDING TO PT NEUROLOGY MD referred pt to her PCP. Pt states she has an appointment with her PCP until February 2018. Pt spoke with Anderson Malta witty RMA on 09/28/16 when she called to give her echo results. RMA asked pt  to keep a log of her BP and call Melina Copa PA with reading for recommendations. Pt states took Valsartan 160-25 three time on her own because she had this medication at home. . The last time she took this medication was yesterday 09/30/16. Last BP reading are 143/86, and 131/85. Pt would like to know if she needs to continue taken  this medication.

## 2016-10-01 NOTE — Telephone Encounter (Signed)
Please thank her for being so diligent about the blood pressures. Please restart valsartan/HCTZ 160/65m daily. Follow blood pressure daily on this medication (at least 3 hours after taking medication). Please call in in 1 week with BP readings. Needs a BMET 1 week after starting this medicine, please arrange.  Dayna Dunn PA-C

## 2016-10-01 NOTE — Telephone Encounter (Signed)
Pt c/o BP issue:  1. What are your last 5 BP readings? Sitting 09-28-16 L 190/113 R 169/107 L 183/101, 09-29-16 L 174/103 R 144/98 took Valsartan 160-25,  L 144/92 R 145/96 09-30-16 L 137/86 145/89, 160/92 took another Valsartan L 143/86 131/85 2. Are you having any other symptoms (ex. Dizziness, headache, blurred vision, passed out)? Non symptomatic  3. What is your medication issue? none

## 2016-10-01 NOTE — Telephone Encounter (Signed)
Med order originally sent in as amlodipine/valsartan/HCTZ as verbal order. We have corrected this to valsartan/HCTZ as recommended. Nurse will call pharmacy to clarify. Dayna Dunn PA-C

## 2016-10-01 NOTE — Telephone Encounter (Signed)
Called pt back.  Thanked her for being so diligent on her bp.  She has been advised to restart valsartan/hctz 160-25 p o q d and to check her bp at least 3 hours after she takes her medication and keep a log and call us in 1 week to report. She will come in the office for BMET 10/07/16.

## 2016-10-06 DIAGNOSIS — R011 Cardiac murmur, unspecified: Secondary | ICD-10-CM | POA: Insufficient documentation

## 2016-10-06 NOTE — Progress Notes (Deleted)
Cardiology Office Note    Date:  10/06/2016  ID:  [name] [name], DOB 1951/06/05, MRN [id number] PCP:  [name], MD  Cardiologist:  Dr. Curt Bears (EP/loop consult during admission for stroke)   Chief Complaint: f/u BP  History of Present Illness:  [name] [name] is a 66 y.o. female with history of HTN, HLD, OSA (no CPAP after bariatric surgery), obesity, recurrent stroke (03/2016 prompting ILR, residual parasthesias of lips and fingers on right hand), macular degeneration, chronic low back pain, DM, arthritis, heart murmur, pituitary macroadenoma s/p surgrey, anemia, B12 deficiency who presents for evaluation of murmur. She was found to have stroke in 01/2016 with w/u notable for carotid duplex 1-39% stenosis, 2D echo 01/2016: mod LVH, EF 66-50%, moderately calcified AV leaflets without stenosis, mildly calcified mitral annulus. She wore an event monitor which showed no atrial fib. She was readmitted for recurrent stroke 03/2016 at which time EP placed a loop recorder. TEE at that time 04/10/16 showed EF 55-60%, no RWMA, + atrial septal aneurysm, no evidence of thrombus, no PFO. She was noted to have an acute DVT in the mid left femoral vein. Transcranial doppler was negative, no PFO. She was started on Xarelto for DVT. Pertinent labs 08/2016: Hgb 11.6, Plt 251, TSH 1.54. She recently saw her PCP who noted a heart murmur on exam which was possibly new as she hadn't been told this before. Her PCP inquired whether her atrial septal aneurysm carried weight with regard to recurrent CVA. She was also referred to hematology for hypercoagulable workup. 2D echo 09/25/16 to re-eval new murmur showed mild LVH, EF 55-60%, grade 2 DD, mildly calcified aortc valve leaflets but no significant valvular disease, thus benign etiology. At that visit her blood pressure was high. She got clearance from neurology for stricter BP control, and was asked to restart her valsartan/HCTZ daily and call back with  readings.  BMET pending. labs  Essential HTN Heart murmur Cryptogenic stroke and atrial septal aneurysm - see prior note History of DVT   f/u EP      Past Medical History:  Diagnosis Date  . Anemia   . Arthritis    "knees, hips, possibly back" (04/08/2016)  . Atrial septal aneurysm   . B12 deficiency    a. following gastric bypass.  . Chronic lower back pain    spondylosis  . Daily headache    "short ones for the last month or 2" (04/08/2016)  . GERD (gastroesophageal reflux disease)    takes Protonix daily "just to protect my stomach; not for reflux" (04/08/2016)  . Heart murmur    "dx'd by 1 anesthesiologist years and years ago"  . History of loop recorder 03/2016  . Hyperlipidemia   . Hypertension   . Joint pain   . Macular degeneration    "just watching"  . Peripheral edema   . Peripheral neuropathy (HCC)    not on any meds  . Pituitary mass ([facility] [facility])    a. s/p endoscopic surgery 02/2016.  [name] Sleep apnea    no CPAP since weight loss after bariatric surgery '12 (04/08/2016)  . Spondylosis   . Stroke ([place]) 04/07/2016   a. 01/2016 and 03/2016. during admission had + LE DVT.  [name] Type 2 diabetes, diet controlled ([place])   . Urinary incontinence     Past Surgical History:  Procedure Laterality Date  . CATARACT EXTRACTION W/ INTRAOCULAR LENS  IMPLANT, BILATERAL Bilateral ~ 2012  . COLONOSCOPY    . EP IMPLANTABLE  DEVICE N/A 04/10/2016   Procedure: Loop Recorder Insertion;  Surgeon: Will Meredith Leeds, MD;  Location: [facility];  Service: Cardiovascular;  Laterality: N/A;  . ESOPHAGOGASTRODUODENOSCOPY     in the 70's  . JOINT REPLACEMENT    . 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 SINUS ENDO W/FUSION  02/28/2016   Procedure: ENDOSCOPIC SINUS SURGERY WITH NAVIGATION;  Surgeon: Ruby Cola, MD;  Location: [facility];  Service: ENT;;  . TEE WITHOUT CARDIOVERSION N/A 04/10/2016   Procedure: TRANSESOPHAGEAL ECHOCARDIOGRAM (TEE) POSSIBLE LOOP;  Surgeon: Sanda Klein, MD;   Location: [facility];  Service: Cardiovascular;  Laterality: N/A;  . TONSILLECTOMY  1957  . TOTAL HIP ARTHROPLASTY Right 2008  . TOTAL HIP REVISION Right 03/30/2016   Procedure: RIGHT TOTAL HIP REVISION;  Surgeon: Frederik Pear, MD;  Location: [facility];  Service: Orthopedics;  Laterality: Right;  . TOTAL KNEE ARTHROPLASTY Bilateral 2001  . VAGINAL HYSTERECTOMY     "left my ovaries"    Current Medications: Current Outpatient Prescriptions  Medication Sig Dispense Refill  . b complex vitamins capsule Take 1 capsule by mouth daily. 30 capsule 2  . beta carotene w/minerals (OCUVITE) tablet Take 1 tablet by mouth daily.    . Coenzyme Q10 (CO Q-10) 400 MG CAPS Take by mouth.    [name] FLUZONE HIGH-DOSE 0.5 ML SUSY     . gabapentin (NEURONTIN) 300 MG capsule Take 1 capsule (300 mg total) by mouth at bedtime. 30 capsule 11  . iron polysaccharides (NIFEREX) 150 MG capsule Take 1 capsule (150 mg total) by mouth 2 (two) times daily. 60 capsule 2  . magnesium oxide (MAG-OX) 400 MG tablet Take 400 mg by mouth at bedtime.     [name] PREVNAR 13 SUSP injection     . rosuvastatin (CRESTOR) 20 MG tablet Take 1 tablet (20 mg total) by mouth daily. (Patient taking differently: Take 20 mg by mouth at bedtime. ) 30 tablet 0  . valsartan-hydrochlorothiazide (DIOVAN HCT) 160-25 MG tablet Take 1 tablet by mouth daily. 90 tablet 3  . XARELTO 20 MG TABS tablet Take 20 mg by mouth daily.     No current facility-administered medications for this visit.      Allergies:   Ambien [zolpidem]; Atorvastatin; Codeine; Penicillins; Simvastatin; and Byetta 10 mcg pen [exenatide]   Social History   Social History  . Marital status: Single    Spouse name: N/A  . Number of children: 0  . Years of education: college   Occupational History  . Retired    Social History Main Topics  . Smoking status: Former Smoker    Packs/day: 1.00    Years: 40.00    Types: Cigarettes    Quit date: 09/17/2007  . Smokeless tobacco: Never Used   . Alcohol use 0.0 oz/week     Comment: 04/08/2016 "5-6 drinks/year'  . Drug use:     Types: Marijuana     Comment: 04/08/2016 "did some marijuana in college"  . Sexual activity: No   Other Topics Concern  . Not on file   Social History Narrative   Drinks 3 cups of coffee a day      Family History:  The patient's family history includes Congestive Heart Failure in her maternal grandmother; Lung cancer in her maternal grandfather; Lymphoma in her father; Prostate cancer in her father. ***  ROS:   Please see the history of present illness. Otherwise, review of systems is positive for ***.  All other systems are reviewed and  otherwise negative.    PHYSICAL EXAM:   VS:  There were no vitals taken for this visit.  BMI: There is no height or weight on file to calculate BMI. GEN: Well nourished, well developed, in no acute distress  HEENT: normocephalic, atraumatic Neck: no JVD, carotid bruits, or masses Cardiac: ***RRR; no murmurs, rubs, or gallops, no edema  Respiratory:  clear to auscultation bilaterally, normal work of breathing GI: soft, nontender, nondistended, + BS MS: no deformity or atrophy  Skin: warm and dry, no rash Neuro:  Alert and Oriented x 3, Strength and sensation are intact, follows commands Psych: euthymic mood, full affect  Wt Readings from Last 3 Encounters:  09/24/16 248 lb (112.5 kg)  07/20/16 249 lb 12.8 oz (113.3 kg)  06/09/16 252 lb 9.6 oz (114.6 kg)      Studies/Labs Reviewed:   EKG:  EKG was ordered today and personally reviewed by me and demonstrates *** EKG was not ordered today.***  Recent Labs: 02/01/2016: TSH 2.070 05/29/2016: ALT 11; BUN 12.8; Creatinine 0.8; HGB 11.7; Platelets 233; Potassium 4.4; Sodium 141   Lipid Panel    Component Value Date/Time   CHOL 128 04/09/2016 0155   TRIG 133 04/09/2016 0155   HDL 48 04/09/2016 0155   CHOLHDL 2.7 04/09/2016 0155   VLDL 27 04/09/2016 0155   LDLCALC 53 04/09/2016 0155    Additional  studies/ records that were reviewed today include: Summarized above.***    ASSESSMENT & PLAN:   1. ***  Disposition: F/u with ***   Medication Adjustments/Labs and Tests Ordered: Current medicines are reviewed at length with the patient today.  Concerns regarding medicines are outlined above. Medication changes, Labs and Tests ordered today are summarized above and listed in the Patient Instructions accessible in Encounters.   Signed, Melina Copa PA-C  10/06/2016 8:21 AM    [facility] [address], [place], [place]  [address] Phone: [phone]; Fax: [phone]

## 2016-10-07 ENCOUNTER — Ambulatory Visit (INDEPENDENT_AMBULATORY_CARE_PROVIDER_SITE_OTHER): Payer: Medicare Other | Admitting: *Deleted

## 2016-10-07 ENCOUNTER — Other Ambulatory Visit: Payer: Medicare Other

## 2016-10-07 ENCOUNTER — Ambulatory Visit: Payer: Medicare Other | Admitting: Physician Assistant

## 2016-10-07 DIAGNOSIS — I639 Cerebral infarction, unspecified: Secondary | ICD-10-CM | POA: Diagnosis not present

## 2016-10-09 NOTE — Progress Notes (Signed)
[facility] Summary Report / Loop Recorder

## 2016-10-19 DIAGNOSIS — I359 Nonrheumatic aortic valve disorder, unspecified: Secondary | ICD-10-CM | POA: Insufficient documentation

## 2016-10-19 NOTE — Progress Notes (Deleted)
**Note [name]-Identified via Obfuscation** Cardiology Office Note    Date:  10/19/2016  ID:  [name] [name], DOB 06/18/1951, MRN [id number] PCP:  [name], MD  Cardiologist:  ***   Chief Complaint: ***  History of Present Illness:  [name] [name] is a 66 y.o. female with history of HTN, HLD, OSA (no CPAP after bariatric surgery), obesity, recurrent stroke (03/2016 prompting ILR, residual parasthesias of lips and fingers on right hand), macular degeneration, chronic low back pain, DM, arthritis, heart murmur, pituitary macroadenoma s/p surgrey, anemia, B12 deficiency who presents for evaluation of murmur. She was found to have stroke in 01/2016 with w/u notable for carotid duplex 1-39% stenosis, 2D echo 01/2016: mod LVH, EF 66-50%, moderately calcified AV leaflets without stenosis, mildly calcified mitral annulus. She wore an event monitor which showed no atrial fib. She was readmitted for recurrent stroke 03/2016 at which time EP placed a loop recorder. TEE at that time 04/10/16 showed EF 55-60%, no RWMA, + atrial septal aneurysm, no evidence of thrombus, no PFO. She was noted to have an acute DVT in the mid left femoral vein. Transcranial doppler was negative, no PFO. She was started on Xarelto for DVT. Pertinent labs 08/2016: Hgb 11.6, Plt 251, TSH 1.54. She recently saw her PCP who noted a heart murmur on exam which was possibly new as she hadn't been told this before. Her PCP inquired whether her atrial septal aneurysm carried weight with regard to recurrent CVA. She was also referred to hematology for hypercoagulable workup. 2D echo 09/25/16 to re-eval new murmur showed mild LVH, EF 55-60%, grade 2 DD, mildly calcified aortc valve leaflets but no significant valvular disease, thus benign etiology. At that visit her blood pressure was high. She got clearance from neurology for stricter BP control, and was asked to restart her valsartan/HCTZ daily and call back with readings.   BMET pending.  labs   Essential HTN  Heart murmur due  to aortic valve calcification Cryptogenic stroke and atrial septal aneurysm - see prior note  History of DVT    f/u EP     Past Medical History:  Diagnosis Date  . Anemia   . Arthritis    "knees, hips, possibly back" (04/08/2016)  . Atrial septal aneurysm   . B12 deficiency    a. following gastric bypass.  . Chronic lower back pain    spondylosis  . Daily headache    "short ones for the last month or 2" (04/08/2016)  . GERD (gastroesophageal reflux disease)    takes Protonix daily "just to protect my stomach; not for reflux" (04/08/2016)  . Heart murmur    "dx'd by 1 anesthesiologist years and years ago"  . History of loop recorder 03/2016  . Hyperlipidemia   . Hypertension   . Joint pain   . Macular degeneration    "just watching"  . Peripheral edema   . Peripheral neuropathy (HCC)    not on any meds  . Pituitary mass ([place])    a. s/p endoscopic surgery 02/2016.  [name] Sleep apnea    no CPAP since weight loss after bariatric surgery '12 (04/08/2016)  . Spondylosis   . Stroke ([place]) 04/07/2016   a. 01/2016 and 03/2016. during admission had + LE DVT.  [name] Type 2 diabetes, diet controlled ([place])   . Urinary incontinence     Past Surgical History:  Procedure Laterality Date  . CATARACT EXTRACTION W/ INTRAOCULAR LENS  IMPLANT, BILATERAL Bilateral ~ 2012  . COLONOSCOPY    .  EP IMPLANTABLE DEVICE N/A 04/10/2016   Procedure: Loop Recorder Insertion;  Surgeon: Will Meredith Leeds, MD;  Location: [facility];  Service: Cardiovascular;  Laterality: N/A;  . ESOPHAGOGASTRODUODENOSCOPY     in the 70's  . JOINT REPLACEMENT    . 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 SINUS ENDO W/FUSION  02/28/2016   Procedure: ENDOSCOPIC SINUS SURGERY WITH NAVIGATION;  Surgeon: Ruby Cola, MD;  Location: [facility];  Service: ENT;;  . TEE WITHOUT CARDIOVERSION N/A 04/10/2016   Procedure: TRANSESOPHAGEAL ECHOCARDIOGRAM (TEE) POSSIBLE LOOP;  Surgeon: Sanda Klein, MD;  Location: [facility];  Service:  Cardiovascular;  Laterality: N/A;  . TONSILLECTOMY  1957  . TOTAL HIP ARTHROPLASTY Right 2008  . TOTAL HIP REVISION Right 03/30/2016   Procedure: RIGHT TOTAL HIP REVISION;  Surgeon: Frederik Pear, MD;  Location: [facility];  Service: Orthopedics;  Laterality: Right;  . TOTAL KNEE ARTHROPLASTY Bilateral 2001  . VAGINAL HYSTERECTOMY     "left my ovaries"    Current Medications: Current Outpatient Prescriptions  Medication Sig Dispense Refill  . b complex vitamins capsule Take 1 capsule by mouth daily. 30 capsule 2  . beta carotene w/minerals (OCUVITE) tablet Take 1 tablet by mouth daily.    . Coenzyme Q10 (CO Q-10) 400 MG CAPS Take by mouth.    [name] FLUZONE HIGH-DOSE 0.5 ML SUSY     . gabapentin (NEURONTIN) 300 MG capsule Take 1 capsule (300 mg total) by mouth at bedtime. 30 capsule 11  . iron polysaccharides (NIFEREX) 150 MG capsule Take 1 capsule (150 mg total) by mouth 2 (two) times daily. 60 capsule 2  . magnesium oxide (MAG-OX) 400 MG tablet Take 400 mg by mouth at bedtime.     [name] PREVNAR 13 SUSP injection     . rosuvastatin (CRESTOR) 20 MG tablet Take 1 tablet (20 mg total) by mouth daily. (Patient taking differently: Take 20 mg by mouth at bedtime. ) 30 tablet 0  . valsartan-hydrochlorothiazide (DIOVAN HCT) 160-25 MG tablet Take 1 tablet by mouth daily. 90 tablet 3  . XARELTO 20 MG TABS tablet Take 20 mg by mouth daily.     No current facility-administered medications for this visit.      Allergies:   Ambien [zolpidem]; Atorvastatin; Codeine; Penicillins; Simvastatin; and Byetta 10 mcg pen [exenatide]   Social History   Social History  . Marital status: Single    Spouse name: N/A  . Number of children: 0  . Years of education: college   Occupational History  . Retired    Social History Main Topics  . Smoking status: Former Smoker    Packs/day: 1.00    Years: 40.00    Types: Cigarettes    Quit date: 09/17/2007  . Smokeless tobacco: Never Used  . Alcohol use 0.0 oz/week      Comment: 04/08/2016 "5-6 drinks/year'  . Drug use: Yes    Types: Marijuana     Comment: 04/08/2016 "did some marijuana in college"  . Sexual activity: No   Other Topics Concern  . Not on file   Social History Narrative   Drinks 3 cups of coffee a day      Family History:  The patient's family history includes Congestive Heart Failure in her maternal grandmother; Lung cancer in her maternal grandfather; Lymphoma in her father; Prostate cancer in her father. ***  ROS:   Please see the history of present illness. Otherwise, review of systems is positive for ***.  All other systems are  reviewed and otherwise negative.    PHYSICAL EXAM:   VS:  There were no vitals taken for this visit.  BMI: There is no height or weight on file to calculate BMI. GEN: Well nourished, well developed, in no acute distress HEENT: normocephalic, atraumatic Neck: no JVD, carotid bruits, or masses Cardiac: ***RRR; no murmurs, rubs, or gallops, no edema  Respiratory:  clear to auscultation bilaterally, normal work of breathing GI: soft, nontender, nondistended, + BS MS: no deformity or atrophy Skin: warm and dry, no rash Neuro:  Alert and Oriented x 3, Strength and sensation are intact, follows commands Psych: euthymic mood, full affect  Wt Readings from Last 3 Encounters:  09/24/16 248 lb (112.5 kg)  07/20/16 249 lb 12.8 oz (113.3 kg)  06/09/16 252 lb 9.6 oz (114.6 kg)      Studies/Labs Reviewed:   EKG:  EKG was ordered today and personally reviewed by me and demonstrates *** EKG was not ordered today.***  Recent Labs: 02/01/2016: TSH 2.070 05/29/2016: ALT 11; BUN 12.8; Creatinine 0.8; HGB 11.7; Platelets 233; Potassium 4.4; Sodium 141   Lipid Panel    Component Value Date/Time   CHOL 128 04/09/2016 0155   TRIG 133 04/09/2016 0155   HDL 48 04/09/2016 0155   CHOLHDL 2.7 04/09/2016 0155   VLDL 27 04/09/2016 0155   LDLCALC 53 04/09/2016 0155    Additional studies/ records that were reviewed  today include: Summarized above.***    ASSESSMENT & PLAN:   1. ***  Disposition: F/u with ***   Medication Adjustments/Labs and Tests Ordered: Current medicines are reviewed at length with the patient today.  Concerns regarding medicines are outlined above. Medication changes, Labs and Tests ordered today are summarized above and listed in the Patient Instructions accessible in Encounters.   Raechel Ache PA-C  10/19/2016 4:38 PM    [facility] [address], [place], [place]  [address] Phone: [phone]; Fax: [phone]

## 2016-10-22 ENCOUNTER — Telehealth: Payer: Self-pay | Admitting: Physician Assistant

## 2016-10-22 ENCOUNTER — Ambulatory Visit: Payer: Medicare Other | Admitting: Physician Assistant

## 2016-10-22 ENCOUNTER — Other Ambulatory Visit: Payer: Medicare Other | Admitting: *Deleted

## 2016-10-22 DIAGNOSIS — Z79899 Other long term (current) drug therapy: Secondary | ICD-10-CM | POA: Diagnosis not present

## 2016-10-22 LAB — BASIC METABOLIC PANEL
BUN/Creatinine Ratio: 17 (ref 12–28)
BUN: 14 mg/dL (ref 8–27)
CALCIUM: 8.6 mg/dL — AB (ref 8.7–10.3)
CHLORIDE: 97 mmol/L (ref 96–106)
CO2: 24 mmol/L (ref 18–29)
CREATININE: 0.83 mg/dL (ref 0.57–1.00)
GFR, EST AFRICAN AMERICAN: 86 mL/min/{1.73_m2} (ref >59)
GFR, EST NON AFRICAN AMERICAN: 74 mL/min/{1.73_m2} (ref >59)
Glucose: 150 mg/dL — ABNORMAL HIGH (ref 65–99)
Potassium: 4.2 mmol/L (ref 3.5–5.2)
Sodium: 138 mmol/L (ref 134–144)

## 2016-10-22 NOTE — Telephone Encounter (Signed)
Walk In pt form-BP Readings Dropped off-gave to [employer]

## 2016-10-26 LAB — CUP PACEART REMOTE DEVICE CHECK
Date Time Interrogation Session: 20171219010742
MDC IDC PG IMPLANT DT: 20170721

## 2016-10-27 DIAGNOSIS — I10 Essential (primary) hypertension: Secondary | ICD-10-CM | POA: Diagnosis not present

## 2016-10-27 DIAGNOSIS — E7800 Pure hypercholesterolemia, unspecified: Secondary | ICD-10-CM | POA: Diagnosis not present

## 2016-10-27 DIAGNOSIS — E119 Type 2 diabetes mellitus without complications: Secondary | ICD-10-CM | POA: Diagnosis not present

## 2016-10-27 DIAGNOSIS — G629 Polyneuropathy, unspecified: Secondary | ICD-10-CM | POA: Diagnosis not present

## 2016-10-27 DIAGNOSIS — Z8673 Personal history of transient ischemic attack (TIA), and cerebral infarction without residual deficits: Secondary | ICD-10-CM | POA: Diagnosis not present

## 2016-11-06 ENCOUNTER — Ambulatory Visit (INDEPENDENT_AMBULATORY_CARE_PROVIDER_SITE_OTHER): Payer: Medicare Other | Admitting: *Deleted

## 2016-11-06 DIAGNOSIS — I639 Cerebral infarction, unspecified: Secondary | ICD-10-CM | POA: Diagnosis not present

## 2016-11-09 NOTE — Progress Notes (Signed)
[facility] Summary Report / Loop Recorder

## 2016-11-14 LAB — CUP PACEART REMOTE DEVICE CHECK
MDC IDC PG IMPLANT DT: 20170721
MDC IDC SESS DTM: 20180118013822

## 2016-11-25 LAB — CUP PACEART REMOTE DEVICE CHECK
Date Time Interrogation Session: 20180217014230
MDC IDC PG IMPLANT DT: 20170721

## 2016-12-07 ENCOUNTER — Ambulatory Visit (INDEPENDENT_AMBULATORY_CARE_PROVIDER_SITE_OTHER): Payer: Medicare Other | Admitting: *Deleted

## 2016-12-07 DIAGNOSIS — I639 Cerebral infarction, unspecified: Secondary | ICD-10-CM | POA: Diagnosis not present

## 2016-12-07 NOTE — Progress Notes (Signed)
Carelink Summary Report / Loop Recorder 

## 2016-12-21 LAB — CUP PACEART REMOTE DEVICE CHECK
Date Time Interrogation Session: 20180319020544
MDC IDC PG IMPLANT DT: 20170721

## 2017-01-05 ENCOUNTER — Ambulatory Visit (INDEPENDENT_AMBULATORY_CARE_PROVIDER_SITE_OTHER): Payer: Medicare Other | Admitting: *Deleted

## 2017-01-05 DIAGNOSIS — I639 Cerebral infarction, unspecified: Secondary | ICD-10-CM

## 2017-01-06 NOTE — Progress Notes (Signed)
[facility] Summary Report / Loop Recorder

## 2017-01-18 ENCOUNTER — Encounter: Payer: Self-pay | Admitting: Neurology

## 2017-01-18 ENCOUNTER — Ambulatory Visit (INDEPENDENT_AMBULATORY_CARE_PROVIDER_SITE_OTHER): Payer: Medicare Other | Admitting: Neurology

## 2017-01-18 VITALS — BP 137/86 | Ht 63.0 in | Wt 252.4 lb

## 2017-01-18 DIAGNOSIS — M48061 Spinal stenosis, lumbar region without neurogenic claudication: Secondary | ICD-10-CM | POA: Diagnosis not present

## 2017-01-18 DIAGNOSIS — M545 Low back pain: Secondary | ICD-10-CM

## 2017-01-18 DIAGNOSIS — M5416 Radiculopathy, lumbar region: Secondary | ICD-10-CM | POA: Diagnosis not present

## 2017-01-18 DIAGNOSIS — I639 Cerebral infarction, unspecified: Secondary | ICD-10-CM | POA: Diagnosis not present

## 2017-01-18 MED ORDER — GABAPENTIN 300 MG PO CAPS: 600.0000 mg | ORAL_CAPSULE | Freq: Every day | ORAL | 11 refills | Status: DC

## 2017-01-18 NOTE — Progress Notes (Signed)
[name]    Provider:  Dr Jaynee Eagles Referring Provider: Antony Blackbird, MD Primary Care Physician:  [name], MD   CC: Stroke, Cavernous sinus mass  Interval history 01/18/2017:  Patient is here for follow up. 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 Feet are burning a little more. 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  Recommend the Healthy [facility] and [place] to address her obesity and multiple joint pain. She does not snore and is not tired during the day, had a borderline sleep evaluation in the past will monitor for OSA, The burning in her feet is worsening.  More noticeable at night. She is on Lipoic acid daily.   Interval history 07/20/2016: She has had neuropathy for years.She has had neuropathy for over 15 years or longer. It started in the feet. Numbness, burning in the feet, they feel cold to the touvh but she feels them burning, she has pain in the feet. She tried Lyrica possible in a neuropathy drug trial and she believe she had side effects. Worse at night in bed. Cramping in the feet.   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 She does not know how long she had B12 deficiency. Her last B12 was normal 843. She takes oral supplements. Also HgbA1c 6.0. TSH 2.070. She also has a history of diabetes which improved after bariatric surgery but still elevated 6.0. HgbA1c was as high as 7.7 in the past.   Interval history 06/09/2016: Since being seen patient has had another stroke. She was admitted 04/10/2016. She had newly identified clot by LE by doppler but TCD bubble study was negative. She was discharged on Xarelto. Loop recorder was placed. She presented to the ED with slurred speech. Most symptoms have resolved. She cannot walk very well, her legs and feet are very numb. She has numbness in the feet and legs. She has imbalance. And she  has had a history of B12 deficiency. She still has the mouth numbness and right hand sensory changes from previous stroke but the aphasia is resolved.   Ct Head Wo Contrast 04/08/2016 1. 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 2. New focal low-density in the left posterior frontal periventricular white matter, potentially a subacute small vessel infarct. Extensive chronic small vessel ischemic changes in the periventricular white matter are otherwise stable. No evidence of acute cortical stroke or hemorrhage.   Mr Brain Wo Contrast 04/08/2016 1. Confirmation of acute nonhemorrhagic white matter infarct involving the left corona radiata and posterior centrum semi of ally. 2. Otherwise stable severe periventricular and diffuse subcortical T2 hyperintensities, advanced for age. This represents the sequela of chronic microvascular ischemia. 3. 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   Ct Angio Head & Neck W Or Wo Contrast 04/09/2016 1. Positive for arterial abnormalities associated with the acute white matter ischemia: Severe stenosis at the origin of a left MCA middle sylvian division M2 branch. Moderate irregularity and stenosis in anterior division left M2/ M3 branches. No left MCA M1 stenosis or occlusion. 2. Otherwise carotid bifurcation and ICA siphon atherosclerosis without hemodynamically significant stenosis. 3. Negative posterior circulation. 4. Stable CT appearance of the brain since yesterday. No associated hemorrhage or mass effect. 5. Abnormal CT appearance of the sella turcica and  left cavernous sinus corresponding to the chronic invasive pituitary adenoma. 6. No acute findings in the neck.   LE venous dopplers Right lower extremity is negativefor deep vein thrombosis. The left lower extremity is positivefor deep vein  thrombosis involving a small segment of the left mid femoral vein.There is no evidence of Baker's cyst bilaterally  TEE Normal TEE. No cardioembolic source.  TCD bubble study - negative for PFO  UTM:LYYTKP A Jarrardis a 66 y.o.femalehere as a follow-up from the hospital after a stroke. She still has numbness in the right fingers and moth due to the left thalamic stroke. We discussed her strokes, she is due for Cardiac Monitoring and then possibly Loop. Will discuss with our Vascular doctors. Also discussed at length the invasive pituitary tumor shown, I reviewed imaging with patient and pointed out findingd, discussed next steps including MRI w/wo with pituitary protocol as well as referral to [place].She was not on an aspirin when this happened.   Reviewed notes, labs and imaging from outside physicians, which showed:  IMPRESSION: 1. Acute lacunar infarct in the left thalamus. 2. Possible subacute lacunar infarct in the right splenium of the corpus callosum. 3. Moderate chronic small vessel ischemic disease. 4. Mass involving the sella, left cavernous sinus, and left sphenoid sinus, suspicious for invasive pituitary macroadenoma. Further evaluation with nonemergent pituitary protocol MRI (without and with contrast) is recommended. NEUROSURGERY REVIEWED AND FEELS THIS IS A MASS OF THE SPHENOID SINUS THAT HAS INVADED THE CAVERNOUS SINUS,NOT PITUITARY TUMOR 5. No major intracranial arterial occlusion or significant proximal posterior circulation stenosis. 6. Severe proximal left M2 MCA stenoses.  Review of Systems: Patient complains of symptoms per HPI as well as the following symptoms: No CP, no SOB, no fever, no chills. Pertinent negatives per HPI. All others negative.   Social History   Social History  . Marital status: Single    Spouse name: N/A  . Number of children: 0  . Years of education: college   Occupational History  . Retired    Social History Main Topics  .  Smoking status: Former Smoker    Packs/day: 1.00    Years: 40.00    Types: Cigarettes    Quit date: 09/17/2007  . Smokeless tobacco: Never Used  . Alcohol use 0.0 oz/week     Comment: 04/08/2016 "5-6 drinks/year'  . Drug use: No     Comment: 04/08/2016 "did some marijuana in college"  . Sexual activity: No   Other Topics Concern  . Not on file   Social History Narrative   Lives at home alone   Right-handed   Drinks 3 cups of coffee a day     Family History  Problem Relation Age of Onset  . Neuropathy Mother   . Prostate cancer Father   . Lymphoma Father   . Congestive Heart Failure Maternal Grandmother   . Lung cancer Maternal Grandfather     Past Medical History:  Diagnosis Date  . Anemia   . Arthritis    "knees, hips, possibly back" (04/08/2016)  . Atrial septal aneurysm   . B12 deficiency    a. following gastric bypass.  . Chronic lower back pain    spondylosis  . Daily headache    "short ones for the last month or 2" (04/08/2016)  . GERD (gastroesophageal reflux disease)    takes Protonix daily "just to protect my stomach; not for reflux" (04/08/2016)  . Heart murmur    "dx'd by 1 anesthesiologist years and years ago"  .  History of loop recorder 03/2016  . Hyperlipidemia   . Hypertension   . Joint pain   . Macular degeneration    "just watching"  . Peripheral edema   . Peripheral neuropathy    not on any meds  . Pituitary mass ([place])    a. s/p endoscopic surgery 02/2016.  [name] [name] Sleep apnea    no CPAP since weight loss after bariatric surgery '12 (04/08/2016)  . Spondylosis   . Stroke ([place]) 04/07/2016   a. 01/2016 and 03/2016. during admission had + LE DVT.  [name] [name] Type 2 diabetes, diet controlled ([place])   . Urinary incontinence     Past Surgical History:  Procedure Laterality Date  . CATARACT EXTRACTION W/ INTRAOCULAR LENS  IMPLANT, BILATERAL Bilateral ~ 2012  . COLONOSCOPY    . EP IMPLANTABLE DEVICE N/A 04/10/2016   Procedure: Loop Recorder Insertion;  Surgeon:  Will Meredith Leeds, MD;  Location: [facility];  Service: Cardiovascular;  Laterality: N/A;  . ESOPHAGOGASTRODUODENOSCOPY     in the 70's  . JOINT REPLACEMENT    . 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 SINUS ENDO W/FUSION  02/28/2016   Procedure: ENDOSCOPIC SINUS SURGERY WITH NAVIGATION;  Surgeon: Ruby Cola, MD;  Location: [facility];  Service: ENT;;  . TEE WITHOUT CARDIOVERSION N/A 04/10/2016   Procedure: TRANSESOPHAGEAL ECHOCARDIOGRAM (TEE) POSSIBLE LOOP;  Surgeon: Sanda Klein, MD;  Location: [facility];  Service: Cardiovascular;  Laterality: N/A;  . TONSILLECTOMY  1957  . TOTAL HIP ARTHROPLASTY Right 2008  . TOTAL HIP REVISION Right 03/30/2016   Procedure: RIGHT TOTAL HIP REVISION;  Surgeon: Frederik Pear, MD;  Location: [facility];  Service: Orthopedics;  Laterality: Right;  . TOTAL KNEE ARTHROPLASTY Bilateral 2001  . VAGINAL HYSTERECTOMY     "left my ovaries"    Current Outpatient Prescriptions  Medication Sig Dispense Refill  . Alpha Lipoic Acid 200 MG CAPS     . b complex vitamins capsule Take 1 capsule by mouth daily. 30 capsule 2  . beta carotene w/minerals (OCUVITE) tablet Take 1 tablet by mouth daily.    . Coenzyme Q10 (CO Q-10) 400 MG CAPS Take by mouth.    . cyanocobalamin 500 MCG tablet Take 500 mcg by mouth daily.    [name] [name] gabapentin (NEURONTIN) 300 MG capsule Take 1 capsule (300 mg total) by mouth at bedtime. 30 capsule 11  . magnesium oxide (MAG-OX) 400 MG tablet Take 400 mg by mouth at bedtime.     [name] [name] PREVNAR 13 SUSP injection     . rosuvastatin (CRESTOR) 20 MG tablet Take 1 tablet (20 mg total) by mouth daily. (Patient taking differently: Take 20 mg by mouth at bedtime. ) 30 tablet 0  . valsartan-hydrochlorothiazide (DIOVAN HCT) 160-25 MG tablet Take 1 tablet by mouth daily. 90 tablet 3  . XARELTO 20 MG TABS tablet Take 20 mg by mouth daily.     No current facility-administered medications for this visit.     Allergies as of 01/18/2017 - Review Complete 01/18/2017    Allergen Reaction Noted  . Ambien [zolpidem] Other (See Comments) 03/27/2016  . Atorvastatin Other (See Comments) 02/24/2016  . Codeine Nausea And Vomiting 05/29/2011  . Penicillins Itching and Other (See Comments) 05/29/2011  . Simvastatin Other (See Comments) 03/27/2016  . Byetta 10 mcg pen [exenatide] Nausea Only 03/27/2016    Vitals: BP 137/86   Ht _0  (1.6 m)   Wt 252 lb 6.4 oz (114.5 kg)   BMI 44.71 kg/m  Last  Weight:  Wt Readings from Last 1 Encounters:  01/18/17 252 lb 6.4 oz (114.5 kg)   Last Height:   Ht Readings from Last 1 Encounters:  01/18/17 5' 3" (1.6 m)    Neuro: Detailed Neurologic Exam  Speech: Speech is normal; fluent and spontaneous with normal comprehension.  Cognition: The patient is oriented to person, place, and time;  recent and remote memory intact;  language fluent;  normal attention, concentration,  fund of knowledge Cranial Nerves: The pupils are equal, round, and reactive to light. The fundi are normal and spontaneous venous pulsations are present. Visual fields are full to finger confrontation. Extraocular movements are intact. Trigeminal sensation is intact and the muscles of mastication are normal. The face is symmetric. The palate elevates in the midline. Hearing intact. Voice is normal. Shoulder shrug is normal. The tongue has normal motion without fasciculations.   Coordination: Normal finger to nose and heel to shin. Normal rapid alternating movements.   Gait: Heel-toe and tandem gait are normal.   Motor Observation: No asymmetry, no atrophy, and no involuntary movements noted. Tone: Normal muscle tone.   Posture: Posture is normal. normal erect  Strength: Strength is V/V in the upper and lower limbs.   Sensation: Decreased pin prick to the knees, absent vibration to the ankles, intact propriceotpion.  Reflex Exam:  DTR's: Absent AJs  Toes: The toes are  downgoing bilaterally.  Clonus: Clonus is absent.  Assessment and Plan:Ms.Julieanne Hadsall Jarrardis a 66 y.o.femalewith history of HTN, HLD,TIA/stroke, diet controlled DM type 2, peripheral neuropathy,s/p gastric bypass. . L corona radiata/posterior centrum semiovale infarct, etiology not clear. She had left thalamic and right splenium punctate infarcts in 01/2016, suspicious for cardioembolic infarcts. Pt does have severe L M2 inferior division stenosis and recent hypotension, but the distribution of M2 inferior does not fit into these infarcts. TEE normal and loop placed. Found to have DVT likely due to recent hip surgery but TCD bubble study no PFO.    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 Risk factors include b12 deficiency and diabetes. Will test for other causes of neuropathy. neurontin at night.Alpha-lipoic acid daily. Discussed side effects as per patient instructions.  - Loop recorder placed 7/21 - TCD bubble negative for PF - LDL53, HgbA1c 6.0 - BP goal 130-150 due to left M2 severe stenosis - LDL 53, goal < 70, Continue statin  - HgbA1c 6.0, goal < 7.0 - Hx OSA, no CPAP since bariatric surgery in 2012 - Recommend the [facility]  Cc; Dr. Vanetta Shawl, [facility] [address] [place], [place] [address]  Phone [phone] Fax [phone]  A total of 25 minutes was spent face-to-face with this patient. Over half this time was spent on counseling patient on the  diagnosis and different diagnostic and therapeutic options available.

## 2017-01-18 NOTE — Progress Notes (Signed)
Rx for TENs unit faxed to [facility] on [address] # [phone].

## 2017-01-18 NOTE — Addendum Note (Signed)
Addended by: Sarina Ill B on: 01/18/2017 11:49 AM   Modules accepted: Orders

## 2017-01-18 NOTE — Patient Instructions (Signed)
Remember to drink plenty of fluid, eat healthy meals and do not skip any meals. Try to eat protein with a every meal and eat a healthy snack such as fruit or nuts in between meals. Try to keep a regular sleep-wake schedule and try to exercise daily, particularly in the form of walking, 20-30 minutes a day, if you can.   As far as your medications are concerned, I would like to suggest: Increase Neurontin to 633m at bedtime  I would like to see you back in 1 year or, sooner if we need to. Please call [facility] any interim questions, concerns, problems, updates or refill requests.   Our phone number is [phone] We also have an after hours call service for urgent matters and there is a physician on-call for urgent questions. For any emergencies you know to call 911 or go to the nearest emergency room  [facility] [place]

## 2017-01-22 DIAGNOSIS — H35363 Drusen (degenerative) of macula, bilateral: Secondary | ICD-10-CM | POA: Diagnosis not present

## 2017-01-22 DIAGNOSIS — E119 Type 2 diabetes mellitus without complications: Secondary | ICD-10-CM | POA: Diagnosis not present

## 2017-01-22 LAB — CUP PACEART REMOTE DEVICE CHECK
Date Time Interrogation Session: 20180418021920
Implantable Pulse Generator Implant Date: 20170721

## 2017-01-27 ENCOUNTER — Encounter: Payer: Self-pay | Admitting: Neurology

## 2017-01-27 DIAGNOSIS — M5416 Radiculopathy, lumbar region: Secondary | ICD-10-CM

## 2017-01-27 DIAGNOSIS — M48061 Spinal stenosis, lumbar region without neurogenic claudication: Secondary | ICD-10-CM

## 2017-01-27 DIAGNOSIS — M545 Low back pain: Secondary | ICD-10-CM

## 2017-02-03 ENCOUNTER — Ambulatory Visit: Payer: Medicare Other | Attending: Neurology

## 2017-02-03 DIAGNOSIS — M545 Low back pain: Secondary | ICD-10-CM | POA: Insufficient documentation

## 2017-02-03 DIAGNOSIS — G8929 Other chronic pain: Secondary | ICD-10-CM

## 2017-02-03 NOTE — Therapy (Signed)
    [facility] [address], [place], [place], [address] Phone: [phone]   Fax:  [phone]  Patient Details  Name: [name] [name] MRN: [id number] Date of Birth: Feb 11, 1951 Referring Provider:  Melvenia Beam, MD  Encounter Date: 02/03/2017  Evaluation not complete as pt only wanted and this clinic doesn't carry TENs units.  Pt was put in contact with TENs unit company to receive a unit.      Sigurd Sos, PT 02/03/17 12:55 PM  [facility] [address], [place], [place], [address] Phone: [phone]   Fax:  [phone]

## 2017-02-04 ENCOUNTER — Ambulatory Visit (INDEPENDENT_AMBULATORY_CARE_PROVIDER_SITE_OTHER): Payer: Medicare Other | Admitting: *Deleted

## 2017-02-04 DIAGNOSIS — I639 Cerebral infarction, unspecified: Secondary | ICD-10-CM

## 2017-02-05 NOTE — Progress Notes (Signed)
Carelink Summary Report / Loop Recorder 

## 2017-02-15 LAB — CUP PACEART REMOTE DEVICE CHECK
Implantable Pulse Generator Implant Date: 20170721
MDC IDC SESS DTM: 20180518024046

## 2017-03-08 ENCOUNTER — Ambulatory Visit (INDEPENDENT_AMBULATORY_CARE_PROVIDER_SITE_OTHER): Payer: Medicare Other | Admitting: *Deleted

## 2017-03-08 DIAGNOSIS — I639 Cerebral infarction, unspecified: Secondary | ICD-10-CM

## 2017-03-08 NOTE — Progress Notes (Signed)
Carelink Summary Report / Loop Recorder 

## 2017-03-16 LAB — CUP PACEART REMOTE DEVICE CHECK
Date Time Interrogation Session: 20180617033741
MDC IDC PG IMPLANT DT: 20170721

## 2017-04-05 ENCOUNTER — Ambulatory Visit (INDEPENDENT_AMBULATORY_CARE_PROVIDER_SITE_OTHER): Payer: Medicare Other | Admitting: *Deleted

## 2017-04-05 DIAGNOSIS — I639 Cerebral infarction, unspecified: Secondary | ICD-10-CM

## 2017-04-06 DIAGNOSIS — Z09 Encounter for follow-up examination after completed treatment for conditions other than malignant neoplasm: Secondary | ICD-10-CM | POA: Diagnosis not present

## 2017-04-06 DIAGNOSIS — M25562 Pain in left knee: Secondary | ICD-10-CM | POA: Diagnosis not present

## 2017-04-06 DIAGNOSIS — Z96641 Presence of right artificial hip joint: Secondary | ICD-10-CM | POA: Diagnosis not present

## 2017-04-06 DIAGNOSIS — Z96652 Presence of left artificial knee joint: Secondary | ICD-10-CM | POA: Diagnosis not present

## 2017-04-06 DIAGNOSIS — Z96651 Presence of right artificial knee joint: Secondary | ICD-10-CM | POA: Diagnosis not present

## 2017-04-06 DIAGNOSIS — Z96642 Presence of left artificial hip joint: Secondary | ICD-10-CM | POA: Diagnosis not present

## 2017-04-06 DIAGNOSIS — M25561 Pain in right knee: Secondary | ICD-10-CM | POA: Diagnosis not present

## 2017-04-06 DIAGNOSIS — M25551 Pain in right hip: Secondary | ICD-10-CM | POA: Diagnosis not present

## 2017-04-06 NOTE — Progress Notes (Signed)
[facility] Summary Report / Loop Recorder

## 2017-04-12 LAB — CUP PACEART REMOTE DEVICE CHECK
Implantable Pulse Generator Implant Date: 20170721
MDC IDC SESS DTM: 20180717054454

## 2017-04-12 NOTE — Progress Notes (Signed)
Carelink summary report received. Battery status OK. Normal device function. No new symptom episodes, tachy episodes, brady, or pause episodes. No new AF episodes. Monthly summary reports and ROV/PRN 

## 2017-04-22 ENCOUNTER — Telehealth: Payer: Self-pay

## 2017-04-22 NOTE — Telephone Encounter (Signed)
Pt under the impression that cause of IDA found, and Hgb has continued to rise since January. Called PCP to confirm f/u with Dr. Irene Limbo not needed. No answer and no option for voice mail. Will call back tomorrow.

## 2017-04-23 ENCOUNTER — Telehealth: Payer: Self-pay

## 2017-04-23 NOTE — Telephone Encounter (Signed)
Secretary left inbasket with MD to let us know if pt needs to be scheduled for a f/u visit.

## 2017-04-26 ENCOUNTER — Other Ambulatory Visit: Payer: Self-pay | Admitting: *Deleted

## 2017-04-26 DIAGNOSIS — D649 Anemia, unspecified: Secondary | ICD-10-CM

## 2017-04-27 ENCOUNTER — Telehealth: Payer: Self-pay | Admitting: Hematology

## 2017-04-27 NOTE — Telephone Encounter (Signed)
Spoke with patient regarding her appointment on Thursday.

## 2017-04-29 ENCOUNTER — Ambulatory Visit (HOSPITAL_BASED_OUTPATIENT_CLINIC_OR_DEPARTMENT_OTHER): Payer: Medicare Other

## 2017-04-29 ENCOUNTER — Telehealth: Payer: Self-pay | Admitting: Hematology

## 2017-04-29 ENCOUNTER — Encounter: Payer: Self-pay | Admitting: Hematology

## 2017-04-29 ENCOUNTER — Ambulatory Visit (HOSPITAL_BASED_OUTPATIENT_CLINIC_OR_DEPARTMENT_OTHER): Payer: Medicare Other | Admitting: Hematology

## 2017-04-29 VITALS — BP 133/59 | HR 70 | Temp 98.6°F | Resp 18 | Ht 63.0 in | Wt 254.4 lb

## 2017-04-29 DIAGNOSIS — Z8673 Personal history of transient ischemic attack (TIA), and cerebral infarction without residual deficits: Secondary | ICD-10-CM

## 2017-04-29 DIAGNOSIS — Z7901 Long term (current) use of anticoagulants: Secondary | ICD-10-CM | POA: Diagnosis not present

## 2017-04-29 DIAGNOSIS — Z9884 Bariatric surgery status: Secondary | ICD-10-CM

## 2017-04-29 DIAGNOSIS — Z862 Personal history of diseases of the blood and blood-forming organs and certain disorders involving the immune mechanism: Secondary | ICD-10-CM | POA: Diagnosis present

## 2017-04-29 DIAGNOSIS — I82402 Acute embolism and thrombosis of unspecified deep veins of left lower extremity: Secondary | ICD-10-CM

## 2017-04-29 DIAGNOSIS — D649 Anemia, unspecified: Secondary | ICD-10-CM | POA: Diagnosis not present

## 2017-04-29 DIAGNOSIS — D6859 Other primary thrombophilia: Secondary | ICD-10-CM

## 2017-04-29 DIAGNOSIS — D509 Iron deficiency anemia, unspecified: Secondary | ICD-10-CM

## 2017-04-29 LAB — COMPREHENSIVE METABOLIC PANEL
ALT: 14 U/L (ref 0–55)
AST: 17 U/L (ref 5–34)
Albumin: 3.4 g/dL — ABNORMAL LOW (ref 3.5–5.0)
Alkaline Phosphatase: 116 U/L (ref 40–150)
Anion Gap: 7 mEq/L (ref 3–11)
BUN: 15.8 mg/dL (ref 7.0–26.0)
CHLORIDE: 102 meq/L (ref 98–109)
CO2: 29 meq/L (ref 22–29)
CREATININE: 0.8 mg/dL (ref 0.6–1.1)
Calcium: 9.2 mg/dL (ref 8.4–10.4)
EGFR: 79 mL/min/{1.73_m2} — ABNORMAL LOW (ref >90)
Glucose: 120 mg/dl (ref 70–140)
Potassium: 3.9 mEq/L (ref 3.5–5.1)
Sodium: 139 mEq/L (ref 136–145)
Total Bilirubin: 0.41 mg/dL (ref 0.20–1.20)
Total Protein: 6.8 g/dL (ref 6.4–8.3)

## 2017-04-29 LAB — CBC & DIFF AND RETIC
BASO%: 0.5 % (ref 0.0–2.0)
Basophils Absolute: 0 10*3/uL (ref 0.0–0.1)
EOS%: 2.7 % (ref 0.0–7.0)
Eosinophils Absolute: 0.2 10*3/uL (ref 0.0–0.5)
HCT: 36.9 % (ref 34.8–46.6)
HGB: 11.9 g/dL (ref 11.6–15.9)
Immature Retic Fract: 8.8 % (ref 1.60–10.00)
LYMPH#: 1.7 10*3/uL (ref 0.9–3.3)
LYMPH%: 21.3 % (ref 14.0–49.7)
MCH: 26.3 pg (ref 25.1–34.0)
MCHC: 32.2 g/dL (ref 31.5–36.0)
MCV: 81.5 fL (ref 79.5–101.0)
MONO#: 0.6 10*3/uL (ref 0.1–0.9)
MONO%: 7.2 % (ref 0.0–14.0)
NEUT%: 68.3 % (ref 38.4–76.8)
NEUTROS ABS: 5.4 10*3/uL (ref 1.5–6.5)
Platelets: 214 10*3/uL (ref 145–400)
RBC: 4.53 10*6/uL (ref 3.70–5.45)
RDW: 14.2 % (ref 11.2–14.5)
RETIC %: 1.56 % (ref 0.70–2.10)
RETIC CT ABS: 70.67 10*3/uL (ref 33.70–90.70)
WBC: 7.9 10*3/uL (ref 3.9–10.3)
nRBC: 0 % (ref 0–0)

## 2017-04-29 LAB — IRON AND TIBC
%SAT: 15 % — AB (ref 21–57)
IRON: 51 ug/dL (ref 41–142)
TIBC: 343 ug/dL (ref 236–444)
UIBC: 292 ug/dL (ref 120–384)

## 2017-04-29 LAB — FERRITIN: Ferritin: 23 ng/ml (ref 9–269)

## 2017-04-29 NOTE — Telephone Encounter (Signed)
Scheduled appt per 8/9 los - Labs today RTC with Dr Irene Limbo as needed- Gave patient AVS

## 2017-04-29 NOTE — Patient Instructions (Signed)
Thank you for choosing [facility] to provide your oncology and hematology care.  To afford each patient quality time with our providers, please arrive 30 minutes before your scheduled appointment time.  If you arrive late for your appointment, you may be asked to reschedule.  We strive to give you quality time with our providers, and arriving late affects you and other patients whose appointments are after yours.   If you are a no show for multiple scheduled visits, you may be dismissed from the clinic at the providers discretion.    Again, thank you for choosing [facility], our hope is that these requests will decrease the amount of time that you wait before being seen by our physicians.  ______________________________________________________________________  Should you have questions after your visit to the [facility], please contact our office at [phone] between the hours of 8:30 and 4:30 p.m.    Voicemails left after 4:30p.m will not be returned until the following business day.    For prescription refill requests, please have your pharmacy contact us directly.  Please also try to allow 48 hours for prescription requests.    Please contact the scheduling department for questions regarding scheduling.  For scheduling of procedures such as PET scans, CT scans, MRI, Ultrasound, etc please contact central scheduling at [phone].    Resources For Cancer Patients and Caregivers:   Oncolink.org:  A wonderful resource for patients and healthcare providers for information regarding your disease, ways to tract your treatment, what to expect, etc.     [place]:  (631) 438-9516  Can help patients locate various types of support and financial assistance  Cancer Care: 1-800-813-HOPE 603-339-1057) Provides financial assistance, online support groups, medication/co-pay assistance.    [place]:  825-438-2561 Where to apply for food  stamps, Medicaid, and utility assistance  Medicare Rights Center: 616-783-2857 Helps people with Medicare understand their rights and benefits, navigate the Medicare system, and secure the quality healthcare they deserve  [employer]: [employer]'s shared-ride transportation service for eligible riders who have a disability that prevents them from riding the fixed route bus.    For additional information on assistance programs please contact our social worker:   [place]:  [phone]

## 2017-04-30 LAB — BETA-2-GLYCOPROTEIN I ABS, IGG/M/A
Beta-2 Glyco 1 IgA: <9 GPI IgA units (ref 0–25)
Beta-2 Glyco 1 IgM: <9 GPI IgM units (ref 0–32)
Beta-2 Glycoprotein I Ab, IgG: <9 GPI IgG units (ref 0–20)

## 2017-04-30 NOTE — Progress Notes (Signed)
[name]    HEMATOLOGY/ONCOLOGY CONSULTATION NOTE  Date of Service: .04/29/2017  Patient Care Team: Rankins, Bill Salinas, MD as PCP - General (Family Medicine)  CHIEF COMPLAINTS/PURPOSE OF CONSULTATION:  Rpt referral for evaluation of hypercoagulable state  HISTORY OF PRESENTING ILLNESS:   [name] [name] is a wonderful 66 y.o. female who has been referred to [place] by Dr .Radene Ou, Bill Salinas, MD  for evaluation and management of Anemia.  Patient has a history of hypertension, dyslipidemia, morbid obesity, sleep apnea, diabetes with recent multiple CVAs, left lower extremity DVT in July 2017 currently on Xarelto , newly diagnosed pituitary adenoma status post surgery on 02/28/2016 who has been referred for evaluation of anemia.  Patient reports that she has had her gastric bypass surgery in 2012 and was on oral iron replacement for a while but then quit.  Her CBC on 02/26/2016 showed a hemoglobin of 11.9 with an MCV of 80.6 with normal platelets and WBC count. Patient had endoscopic surgery for left sphenoidal mass which was noted to be pituitary adenoma on 02/28/2016.  CBC showed a hemoglobin of 12.7 with an MCV of 83.2 on 03/19/2016.  Patient had right total hip ReVision on 03/30/2016 and notes that she subsequently had a large postoperative hematoma. Hemoglobin on 04/01/2016 dropped to 9.6 with an MCV of 82.5. She had labs with her primary care physician on 04/10/2016 that showed a hemoglobin of 9.6 with an MCV of 84, Ferritin level of 26, B12 843, RBC folate within normal limits.  Patient reports that she has been taking B12 supplementation. On 04/10/2016 she was noted to have an acute DVT involving a small segment of her mid left femoral vein and has been started on Xarelto. Patient notes that she had a stool occult blood testing which was negative. Reports no other acute bleeding.  Has been taking Wellesse for iron replacement which contains only 18 mg of elemental iron daily.  Patient's blood  counts today have improved to a hemoglobin of 11.7 with an MCV of 81.5, ferritin 31, iron saturation of 7%. WBC count and platelets within normal limits.  Patient reports she is feeling better and has no other acute concerns and prefers to continue the oral iron. She notes that she tries to minimize her medical contact as much as possible.  INTERVAL HISTORY  [name] [name]   Was seen last year for evaluation of anemia which was though to be from  iron deficiency and responded well to oral iron replacement with resolution of anemia. She had had a acute DVT involving a small segment of her left mid femoral vein after having had hip surgery. This was thought to be a provoked DVT and was treated with Xarelto. Patient follows with Dr. 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 No clear etiology such as a right-to-left shunt or A. fib were noted. She was notably on aspirin when she had her CVAs. She has no family history of venous thromboembolism or arterial thrombosis or other known inherited thrombophilia. Other previous history of unprovoked DVT or PE.    MEDICAL HISTORY:  Past Medical History:  Diagnosis Date  . Anemia   . Arthritis    "knees, hips, possibly back" (04/08/2016)  . Atrial septal aneurysm   . B12 deficiency    a. following gastric bypass.  . Chronic lower back pain    spondylosis  . Daily headache    "short ones for the last month or 2" (04/08/2016)  . GERD (  gastroesophageal reflux disease)    takes Protonix daily "just to protect my stomach; not for reflux" (04/08/2016)  . Heart murmur    "dx'd by 1 anesthesiologist years and years ago"  . History of loop recorder 03/2016  . Hyperlipidemia   . Hypertension   . Joint pain   . Macular degeneration    "just watching"  . Peripheral edema   . Peripheral neuropathy    not on any meds  . Pituitary mass ([place])    a. s/p endoscopic surgery 02/2016.  [name] Sleep apnea    no CPAP since weight loss  after bariatric surgery '12 (04/08/2016)  . Spondylosis   . Stroke ([place]) 04/07/2016   a. 01/2016 and 03/2016. during admission had + LE DVT.  [name] Type 2 diabetes, diet controlled ([place])   . Urinary incontinence     SURGICAL HISTORY: Past Surgical History:  Procedure Laterality Date  . CATARACT EXTRACTION W/ INTRAOCULAR LENS  IMPLANT, BILATERAL Bilateral ~ 2012  . COLONOSCOPY    . EP IMPLANTABLE DEVICE N/A 04/10/2016   Procedure: Loop Recorder Insertion;  Surgeon: Will Meredith Leeds, MD;  Location: [facility];  Service: Cardiovascular;  Laterality: N/A;  . ESOPHAGOGASTRODUODENOSCOPY     in the 70's  . JOINT REPLACEMENT    . 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 SINUS ENDO W/FUSION  02/28/2016   Procedure: ENDOSCOPIC SINUS SURGERY WITH NAVIGATION;  Surgeon: Ruby Cola, MD;  Location: [facility];  Service: ENT;;  . TEE WITHOUT CARDIOVERSION N/A 04/10/2016   Procedure: TRANSESOPHAGEAL ECHOCARDIOGRAM (TEE) POSSIBLE LOOP;  Surgeon: Sanda Klein, MD;  Location: [facility];  Service: Cardiovascular;  Laterality: N/A;  . TONSILLECTOMY  1957  . TOTAL HIP ARTHROPLASTY Right 2008  . TOTAL HIP REVISION Right 03/30/2016   Procedure: RIGHT TOTAL HIP REVISION;  Surgeon: Frederik Pear, MD;  Location: [facility];  Service: Orthopedics;  Laterality: Right;  . TOTAL KNEE ARTHROPLASTY Bilateral 2001  . VAGINAL HYSTERECTOMY     "left my ovaries"    SOCIAL HISTORY: Social History   Social History  . Marital status: Single    Spouse name: N/A  . Number of children: 0  . Years of education: college   Occupational History  . Retired    Social History Main Topics  . Smoking status: Former Smoker    Packs/day: 1.00    Years: 40.00    Types: Cigarettes    Quit date: 09/17/2007  . Smokeless tobacco: Never Used  . Alcohol use 0.0 oz/week     Comment: 04/08/2016 "5-6 drinks/year'  . Drug use: No     Comment: 04/08/2016 "did some marijuana in college"  . Sexual activity: No   Other Topics Concern  .  Not on file   Social History Narrative   Lives at home alone   Right-handed   Drinks 3 cups of coffee a day     FAMILY HISTORY: Family History  Problem Relation Age of Onset  . Neuropathy Mother   . Prostate cancer Father   . Lymphoma Father   . Congestive Heart Failure Maternal Grandmother   . Lung cancer Maternal Grandfather     ALLERGIES:  is allergic to ambien [zolpidem]; atorvastatin; codeine; penicillins; simvastatin; and byetta 10 mcg pen [exenatide].  MEDICATIONS:  Current Outpatient Prescriptions  Medication Sig Dispense Refill  . Alpha Lipoic Acid 200 MG CAPS     . b complex vitamins capsule Take 1 capsule by mouth daily. 30 capsule 2  . beta carotene  w/minerals (OCUVITE) tablet Take 1 tablet by mouth daily.    . Coenzyme Q10 (CO Q-10) 400 MG CAPS Take by mouth.    . cyanocobalamin 500 MCG tablet Take 500 mcg by mouth daily.    [name] gabapentin (NEURONTIN) 300 MG capsule Take 2 capsules (600 mg total) by mouth at bedtime. 60 capsule 11  . magnesium oxide (MAG-OX) 400 MG tablet Take 400 mg by mouth at bedtime.     [name] PREVNAR 13 SUSP injection     . rosuvastatin (CRESTOR) 20 MG tablet Take 1 tablet (20 mg total) by mouth daily. (Patient taking differently: Take 20 mg by mouth at bedtime. ) 30 tablet 0  . valsartan-hydrochlorothiazide (DIOVAN HCT) 160-25 MG tablet Take 1 tablet by mouth daily. 90 tablet 3  . XARELTO 20 MG TABS tablet Take 20 mg by mouth daily.     No current facility-administered medications for this visit.     REVIEW OF SYSTEMS:    10 Point review of Systems was done is negative except as noted above.  PHYSICAL EXAMINATION: ECOG PERFORMANCE STATUS: 2 - Symptomatic, <50% confined to bed  . Vitals:   04/29/17 0839  BP: (!) 133/59  Pulse: 70  Resp: 18  Temp: 98.6 F (37 C)  SpO2: 100%   Filed Weights   04/29/17 0839  Weight: 254 lb 6.4 oz (115.4 kg)   .Body mass index is 45.06 kg/m.  GENERAL:alert, in no acute distress and  comfortable SKIN: skin color, texture, turgor are normal, no rashes or significant lesions EYES: normal, conjunctiva are pink and non-injected, sclera clear OROPHARYNX:no exudate, no erythema and lips, buccal mucosa, and tongue normal  NECK: supple, no JVD, thyroid normal size, non-tender, without nodularity LYMPH:  no palpable lymphadenopathy in the cervical, axillary or inguinal LUNGS: clear to auscultation with normal respiratory effort HEART: regular rate & rhythm,  no murmurs and no lower extremity edema ABDOMEN: abdomen soft, non-tender, normoactive bowel sounds  Musculoskeletal: no cyanosis of digits and no clubbing  PSYCH: alert & oriented x 3 with fluent speech NEURO: no focal motor/sensory deficits  LABORATORY DATA:  I have reviewed the data as listed  . CBC Latest Ref Rng & Units 04/29/2017 05/29/2016 04/13/2016  WBC 3.9 - 10.3 10e3/uL 7.9 8.6 -  Hemoglobin 11.6 - 15.9 g/dL 11.9 11.7 9.9(L)  Hematocrit 34.8 - 46.6 % 36.9 37.8 29.0(L)  Platelets 145 - 400 10e3/uL 214 233 -   . CBC    Component Value Date/Time   WBC 7.9 04/29/2017 0944   WBC 16.0 (H) 04/13/2016 1105   RBC 4.53 04/29/2017 0944   RBC 3.65 (L) 04/13/2016 1105   HGB 11.9 04/29/2017 0944   HCT 36.9 04/29/2017 0944   PLT 214 04/29/2017 0944   MCV 81.5 04/29/2017 0944   MCH 26.3 04/29/2017 0944   MCH 26.3 04/13/2016 1105   MCHC 32.2 04/29/2017 0944   MCHC 31.5 04/13/2016 1105   RDW 14.2 04/29/2017 0944   LYMPHSABS 1.7 04/29/2017 0944   MONOABS 0.6 04/29/2017 0944   EOSABS 0.2 04/29/2017 0944   BASOSABS 0.0 04/29/2017 0944    . CMP Latest Ref Rng & Units 04/29/2017 10/22/2016 07/20/2016  Glucose 70 - 140 mg/dl 120 150(H) -  BUN 7.0 - 26.0 mg/dL 15.8 14 -  Creatinine 0.6 - 1.1 mg/dL 0.8 0.83 -  Sodium 136 - 145 mEq/L 139 138 -  Potassium 3.5 - 5.1 mEq/L 3.9 4.2 -  Chloride 96 - 106 mmol/L - 97 -  CO2 22 -  29 mEq/L 29 24 -  Calcium 8.4 - 10.4 mg/dL 9.2 8.6(L) -  Total Protein 6.4 - 8.3 g/dL 6.8 - 6.6   Total Bilirubin 0.20 - 1.20 mg/dL 0.41 - -  Alkaline Phos 40 - 150 U/L 116 - -  AST 5 - 34 U/L 17 - -  ALT 0 - 55 U/L 14 - -   . Lab Results  Component Value Date   IRON 51 04/29/2017   TIBC 343 04/29/2017   IRONPCTSAT 15 (L) 04/29/2017   (Iron and TIBC)  Lab Results  Component Value Date   FERRITIN 23 04/29/2017     RADIOGRAPHIC STUDIES: I have personally reviewed the radiological images as listed and agreed with the findings in the report. No results found.  ASSESSMENT & PLAN:   66 year old Caucasian female with multiple medical comorbidities as noted about with  1) Normocytic normochromic anemia. MCV low normal. Anemia is predominantly due to acute blood loss related to her previous right hip total revision followed by a postoperative hematoma. She also appears to have had an element of iron deficiency which could be from her blood loss compounded by the fact that she has a history of previous gastric bypass surgery that could limit oral iron absorption.  Patient hemoglobin has improved from a previous level of 9.9 now up to 11.9 today. . Lab Results  Component Value Date   IRON 51 04/29/2017   TIBC 343 04/29/2017   IRONPCTSAT 15 (L) 04/29/2017   (Iron and TIBC)  Lab Results  Component Value Date   FERRITIN 23 04/29/2017    PLAN -Would recommend continued po iron polysaccharide 150 mg by mouth twice a day with orange juice to maintain ferritin levels of more than 50 then could switch to once daily for maintenance in the setting of a history of gastric bypass surgery and chronic PPI therapy. -She should continue her regular B12 replacement. -She was also recommended to take a B complex daily given likely deficiencies in the setting of gastric bypass surgery. -Will need vitamin D monitoring and replacement as per primary care physician. -Monitor for GI losses on active anticoagulation   2) provoked left lower extremity DVT in the postoperative setting after  hip surgery. -This would typically warrant only 6 months of anticoagulation and since it was a provoked event a hypercoagulability workup would not typically be recommended.  3) recurrent CVAs while on aspirin previously. Follows with neurology Dr Jaynee Eagles. Unclear etiology. No overt PFO. Has a loop recorder but no clear evidence of atrial fibrillation at this time. As per neurology notes thought to be possibly cardio-embolic. Plan -Since the patient has had CVAs in the past while on aspirin neurology might consider continuing anticoagulation for clinical suspicion of cardioembolic source causing recurrent CVAs.  This would be a decision that I would defer to the neurologist. -Pattern of involvement is not suggestive of a in situ thrombosis of a large vessel that might be suggestive of a hypercoagulable state. The patient however is curious to rule this possibility out and we will send out a basic hypercoagulable workup. -if the neurologist recommends ongoing anticoagulation from a CVA standpoint the findings of a hypercoagulable workup would not have any bearing on management. .Review of Systems   . Orders Placed This Encounter  Procedures  . CBC & Diff and Retic    Standing Status:   Future    Number of Occurrences:   1    Standing Expiration Date:   04/29/2018  .  Comprehensive metabolic panel    Standing Status:   Future    Number of Occurrences:   1    Standing Expiration Date:   04/29/2018  . Ferritin    Standing Status:   Future    Number of Occurrences:   1    Standing Expiration Date:   04/29/2018  . Iron and TIBC    Standing Status:   Future    Number of Occurrences:   1    Standing Expiration Date:   04/29/2018  . Factor 5 leiden    Standing Status:   Future    Number of Occurrences:   1    Standing Expiration Date:   06/03/2018  . Prothrombin gene mutation    Standing Status:   Future    Number of Occurrences:   1    Standing Expiration Date:   06/03/2018  . Antithrombin III     Standing Status:   Future    Number of Occurrences:   1    Standing Expiration Date:   04/29/2018  . Cardiolipin antibodies, IgG, IgM, IgA    Standing Status:   Future    Number of Occurrences:   1    Standing Expiration Date:   04/29/2018  . Beta-2-glycoprotein i abs, IgG/M/A    Standing Status:   Future    Number of Occurrences:   1    Standing Expiration Date:   04/29/2018  . Lupus anticoagulant panel    Standing Status:   Future    Number of Occurrences:   1    Standing Expiration Date:   06/03/2018  . Protein C activity    Standing Status:   Future    Number of Occurrences:   1    Standing Expiration Date:   04/29/2018  . Protein S activity    Standing Status:   Future    Number of Occurrences:   1    Standing Expiration Date:   04/29/2018    Continue follow-up with primary care physician and neurology.  All of the patients questions were answered with apparent satisfaction. The patient knows to call the clinic with any problems, questions or concerns.  I spent 30 minutes counseling the patient face to face. The total time spent in the appointment was 40 minutes and more than 50% was on counseling and direct patient cares.    Sullivan Lone MD [place] AAHIVMS [facility] Hematology/Oncology Physician [facility]  (Office):       [phone] (Work cell):  [phone] (Fax):           [phone]  04/30/2017 4:14 PM

## 2017-05-01 LAB — LUPUS ANTICOAGULANT PANEL
DRVVT: 125.2 s — AB (ref 0.0–47.0)
PTT-LA: 45 s (ref 0.0–51.9)
dRVVT Confirm: 1.8 ratio — ABNORMAL HIGH (ref 0.8–1.2)
dRVVT Mix: 84.5 s — ABNORMAL HIGH (ref 0.0–47.0)

## 2017-05-01 LAB — PROTEIN C ACTIVITY: Protein C-Functional: 154 % (ref 73–180)

## 2017-05-01 LAB — CARDIOLIPIN ANTIBODIES, IGG, IGM, IGA

## 2017-05-01 LAB — ANTITHROMBIN III: Antithrombin Activity: 141 % — ABNORMAL HIGH (ref 75–135)

## 2017-05-01 LAB — PROTEIN S ACTIVITY: PROTEIN S ACTIVITY: 136 % (ref 63–140)

## 2017-05-03 LAB — PROTHROMBIN GENE MUTATION

## 2017-05-05 ENCOUNTER — Other Ambulatory Visit: Payer: Self-pay | Admitting: Family Medicine

## 2017-05-05 ENCOUNTER — Ambulatory Visit (INDEPENDENT_AMBULATORY_CARE_PROVIDER_SITE_OTHER): Payer: Medicare Other | Admitting: *Deleted

## 2017-05-05 DIAGNOSIS — Z1231 Encounter for screening mammogram for malignant neoplasm of breast: Secondary | ICD-10-CM

## 2017-05-05 DIAGNOSIS — I6340 Cerebral infarction due to embolism of unspecified cerebral artery: Secondary | ICD-10-CM | POA: Diagnosis not present

## 2017-05-06 LAB — FACTOR 5 LEIDEN

## 2017-05-11 LAB — CUP PACEART REMOTE DEVICE CHECK
Date Time Interrogation Session: 20180816101100
MDC IDC PG IMPLANT DT: 20170721

## 2017-05-11 NOTE — Progress Notes (Signed)
Carelink summary report received. Battery status OK. Normal device function. No new symptom episodes, tachy episodes, brady, or pause episodes. No new AF episodes. Monthly summary reports and ROV/PRN 

## 2017-05-14 ENCOUNTER — Ambulatory Visit
Admission: RE | Admit: 2017-05-14 | Discharge: 2017-05-14 | Disposition: A | Discharge status: Home / Self Care | Payer: Medicare Other | Source: Ambulatory Visit | Attending: Family Medicine | Admitting: Family Medicine

## 2017-05-14 DIAGNOSIS — Z1231 Encounter for screening mammogram for malignant neoplasm of breast: Secondary | ICD-10-CM

## 2017-05-17 ENCOUNTER — Other Ambulatory Visit: Payer: Self-pay | Admitting: Family Medicine

## 2017-05-17 DIAGNOSIS — R921 Mammographic calcification found on diagnostic imaging of breast: Secondary | ICD-10-CM

## 2017-05-21 ENCOUNTER — Ambulatory Visit
Admission: RE | Admit: 2017-05-21 | Discharge: 2017-05-21 | Disposition: A | Discharge status: Home / Self Care | Payer: Medicare Other | Source: Ambulatory Visit | Attending: Family Medicine | Admitting: Family Medicine

## 2017-05-21 ENCOUNTER — Other Ambulatory Visit: Payer: Self-pay | Admitting: Family Medicine

## 2017-05-21 DIAGNOSIS — R921 Mammographic calcification found on diagnostic imaging of breast: Secondary | ICD-10-CM | POA: Diagnosis not present

## 2017-05-25 DIAGNOSIS — Z86718 Personal history of other venous thrombosis and embolism: Secondary | ICD-10-CM | POA: Diagnosis not present

## 2017-05-25 DIAGNOSIS — Z8673 Personal history of transient ischemic attack (TIA), and cerebral infarction without residual deficits: Secondary | ICD-10-CM | POA: Diagnosis not present

## 2017-05-25 DIAGNOSIS — I10 Essential (primary) hypertension: Secondary | ICD-10-CM | POA: Diagnosis not present

## 2017-05-25 DIAGNOSIS — E7800 Pure hypercholesterolemia, unspecified: Secondary | ICD-10-CM | POA: Diagnosis not present

## 2017-05-25 DIAGNOSIS — D649 Anemia, unspecified: Secondary | ICD-10-CM | POA: Diagnosis not present

## 2017-05-26 ENCOUNTER — Other Ambulatory Visit: Payer: Self-pay | Admitting: Family Medicine

## 2017-05-26 ENCOUNTER — Ambulatory Visit
Admission: RE | Admit: 2017-05-26 | Discharge: 2017-05-26 | Disposition: A | Discharge status: Home / Self Care | Payer: Medicare Other | Source: Ambulatory Visit | Attending: Family Medicine | Admitting: Family Medicine

## 2017-05-26 DIAGNOSIS — D241 Benign neoplasm of right breast: Secondary | ICD-10-CM | POA: Diagnosis not present

## 2017-05-26 DIAGNOSIS — R921 Mammographic calcification found on diagnostic imaging of breast: Secondary | ICD-10-CM

## 2017-05-29 DIAGNOSIS — Z23 Encounter for immunization: Secondary | ICD-10-CM | POA: Diagnosis not present

## 2017-06-07 ENCOUNTER — Ambulatory Visit (INDEPENDENT_AMBULATORY_CARE_PROVIDER_SITE_OTHER): Payer: Medicare Other | Admitting: *Deleted

## 2017-06-07 DIAGNOSIS — I6340 Cerebral infarction due to embolism of unspecified cerebral artery: Secondary | ICD-10-CM | POA: Diagnosis not present

## 2017-06-07 NOTE — Progress Notes (Signed)
[facility] Summary Report / Loop Recorder

## 2017-06-08 LAB — CUP PACEART REMOTE DEVICE CHECK
Date Time Interrogation Session: 20180915100926
Implantable Pulse Generator Implant Date: 20170721

## 2017-06-21 ENCOUNTER — Encounter (HOSPITAL_COMMUNITY): Payer: Self-pay

## 2017-06-24 ENCOUNTER — Other Ambulatory Visit: Payer: Self-pay

## 2017-06-24 MED ORDER — GABAPENTIN 300 MG PO CAPS: 600.0000 mg | ORAL_CAPSULE | Freq: Every day | ORAL | 2 refills | Status: DC

## 2017-07-05 ENCOUNTER — Ambulatory Visit (INDEPENDENT_AMBULATORY_CARE_PROVIDER_SITE_OTHER): Payer: Medicare Other | Admitting: *Deleted

## 2017-07-05 DIAGNOSIS — I6340 Cerebral infarction due to embolism of unspecified cerebral artery: Secondary | ICD-10-CM | POA: Diagnosis not present

## 2017-07-05 NOTE — Progress Notes (Signed)
[facility] Summary Report / Loop Recorder

## 2017-07-06 ENCOUNTER — Telehealth (HOSPITAL_COMMUNITY): Payer: Self-pay

## 2017-07-06 NOTE — Telephone Encounter (Signed)
Declined appt/ achieved goals This patient is overdue for recommended follow-up with a bariatric surgeon at [facility]. A letter was mailed to the address on file 06/21/17 from both [place] in attempt to reestablish post-op care. The letter included a patient survey which was returned to the [facility] today via mail.  Patient declined an appointment advising she has achieved her goals & does not see need for regular visits.  Copy of the survey was shared with both program [facility] and Mallory Shirk at [facility] so she may file in patients office chart.

## 2017-07-07 DIAGNOSIS — Z23 Encounter for immunization: Secondary | ICD-10-CM | POA: Diagnosis not present

## 2017-07-07 LAB — CUP PACEART REMOTE DEVICE CHECK
Implantable Pulse Generator Implant Date: 20170721
MDC IDC SESS DTM: 20181015140054

## 2017-08-04 ENCOUNTER — Ambulatory Visit (INDEPENDENT_AMBULATORY_CARE_PROVIDER_SITE_OTHER): Payer: Medicare Other | Admitting: *Deleted

## 2017-08-04 DIAGNOSIS — I6340 Cerebral infarction due to embolism of unspecified cerebral artery: Secondary | ICD-10-CM | POA: Diagnosis not present

## 2017-08-04 NOTE — Progress Notes (Signed)
Carelink Summary Report / Loop Recorder 

## 2017-08-20 LAB — CUP PACEART REMOTE DEVICE CHECK
Implantable Pulse Generator Implant Date: 20170721
MDC IDC SESS DTM: 20181114150913

## 2017-09-03 ENCOUNTER — Ambulatory Visit (INDEPENDENT_AMBULATORY_CARE_PROVIDER_SITE_OTHER): Payer: Medicare Other | Admitting: *Deleted

## 2017-09-03 DIAGNOSIS — I6340 Cerebral infarction due to embolism of unspecified cerebral artery: Secondary | ICD-10-CM

## 2017-09-03 NOTE — Progress Notes (Signed)
Carelink Summary Report / Loop Recorder 

## 2017-09-16 DIAGNOSIS — D352 Benign neoplasm of pituitary gland: Secondary | ICD-10-CM | POA: Diagnosis not present

## 2017-09-16 LAB — CUP PACEART REMOTE DEVICE CHECK
Date Time Interrogation Session: 20181214151000
MDC IDC PG IMPLANT DT: 20170721

## 2017-09-29 DIAGNOSIS — E237 Disorder of pituitary gland, unspecified: Secondary | ICD-10-CM | POA: Diagnosis not present

## 2017-09-29 DIAGNOSIS — Z6841 Body Mass Index (BMI) 40.0 and over, adult: Secondary | ICD-10-CM | POA: Diagnosis not present

## 2017-09-29 DIAGNOSIS — R51 Headache: Secondary | ICD-10-CM | POA: Diagnosis not present

## 2017-09-29 DIAGNOSIS — D352 Benign neoplasm of pituitary gland: Secondary | ICD-10-CM | POA: Diagnosis not present

## 2017-09-29 DIAGNOSIS — I10 Essential (primary) hypertension: Secondary | ICD-10-CM | POA: Diagnosis not present

## 2017-10-04 ENCOUNTER — Ambulatory Visit (INDEPENDENT_AMBULATORY_CARE_PROVIDER_SITE_OTHER): Payer: Medicare Other | Admitting: *Deleted

## 2017-10-04 DIAGNOSIS — I6340 Cerebral infarction due to embolism of unspecified cerebral artery: Secondary | ICD-10-CM | POA: Diagnosis not present

## 2017-10-06 NOTE — Progress Notes (Signed)
[facility] Summary Report / Loop Recorder

## 2017-10-12 LAB — CUP PACEART REMOTE DEVICE CHECK
Implantable Pulse Generator Implant Date: 20170721
MDC IDC SESS DTM: 20190113153834

## 2017-11-02 ENCOUNTER — Ambulatory Visit (INDEPENDENT_AMBULATORY_CARE_PROVIDER_SITE_OTHER): Payer: Medicare Other | Admitting: *Deleted

## 2017-11-02 DIAGNOSIS — I6340 Cerebral infarction due to embolism of unspecified cerebral artery: Secondary | ICD-10-CM | POA: Diagnosis not present

## 2017-11-02 NOTE — Progress Notes (Signed)
[facility] Summary Report / Loop Recorder

## 2017-11-24 LAB — CUP PACEART REMOTE DEVICE CHECK
Date Time Interrogation Session: 20190212171928
MDC IDC PG IMPLANT DT: 20170721

## 2017-12-06 ENCOUNTER — Ambulatory Visit (INDEPENDENT_AMBULATORY_CARE_PROVIDER_SITE_OTHER): Payer: Medicare Other | Admitting: *Deleted

## 2017-12-06 DIAGNOSIS — I6340 Cerebral infarction due to embolism of unspecified cerebral artery: Secondary | ICD-10-CM

## 2017-12-06 NOTE — Progress Notes (Signed)
Carelink Summary Report / Loop Recorder 

## 2017-12-17 DIAGNOSIS — M8588 Other specified disorders of bone density and structure, other site: Secondary | ICD-10-CM | POA: Diagnosis not present

## 2017-12-17 DIAGNOSIS — Z131 Encounter for screening for diabetes mellitus: Secondary | ICD-10-CM | POA: Diagnosis not present

## 2017-12-17 DIAGNOSIS — Z8673 Personal history of transient ischemic attack (TIA), and cerebral infarction without residual deficits: Secondary | ICD-10-CM | POA: Diagnosis not present

## 2017-12-17 DIAGNOSIS — Z8639 Personal history of other endocrine, nutritional and metabolic disease: Secondary | ICD-10-CM | POA: Diagnosis not present

## 2017-12-17 DIAGNOSIS — Z7901 Long term (current) use of anticoagulants: Secondary | ICD-10-CM | POA: Diagnosis not present

## 2017-12-17 DIAGNOSIS — G629 Polyneuropathy, unspecified: Secondary | ICD-10-CM | POA: Diagnosis not present

## 2017-12-17 DIAGNOSIS — E7800 Pure hypercholesterolemia, unspecified: Secondary | ICD-10-CM | POA: Diagnosis not present

## 2017-12-17 DIAGNOSIS — I10 Essential (primary) hypertension: Secondary | ICD-10-CM | POA: Diagnosis not present

## 2017-12-20 ENCOUNTER — Other Ambulatory Visit: Payer: Self-pay | Admitting: Family Medicine

## 2017-12-20 DIAGNOSIS — M8580 Other specified disorders of bone density and structure, unspecified site: Secondary | ICD-10-CM

## 2017-12-20 DIAGNOSIS — J189 Pneumonia, unspecified organism: Secondary | ICD-10-CM

## 2017-12-20 DIAGNOSIS — E2839 Other primary ovarian failure: Secondary | ICD-10-CM

## 2017-12-20 HISTORY — DX: Pneumonia, unspecified organism: J18.9

## 2018-01-07 ENCOUNTER — Ambulatory Visit (INDEPENDENT_AMBULATORY_CARE_PROVIDER_SITE_OTHER): Payer: Medicare Other | Admitting: *Deleted

## 2018-01-07 DIAGNOSIS — I6340 Cerebral infarction due to embolism of unspecified cerebral artery: Secondary | ICD-10-CM

## 2018-01-07 NOTE — Progress Notes (Signed)
Carelink Summary Report / Loop Recorder 

## 2018-01-10 ENCOUNTER — Ambulatory Visit
Admission: RE | Admit: 2018-01-10 | Discharge: 2018-01-10 | Disposition: A | Discharge status: Home / Self Care | Payer: Medicare Other | Source: Ambulatory Visit | Attending: Family Medicine | Admitting: Family Medicine

## 2018-01-10 DIAGNOSIS — M8580 Other specified disorders of bone density and structure, unspecified site: Secondary | ICD-10-CM

## 2018-01-10 DIAGNOSIS — Z780 Asymptomatic menopausal state: Secondary | ICD-10-CM | POA: Diagnosis not present

## 2018-01-10 DIAGNOSIS — M85852 Other specified disorders of bone density and structure, left thigh: Secondary | ICD-10-CM | POA: Diagnosis not present

## 2018-01-10 DIAGNOSIS — E2839 Other primary ovarian failure: Secondary | ICD-10-CM

## 2018-01-15 LAB — CUP PACEART REMOTE DEVICE CHECK
Implantable Pulse Generator Implant Date: 20170721
MDC IDC SESS DTM: 20190317163856

## 2018-01-21 ENCOUNTER — Telehealth: Payer: Self-pay | Admitting: *Deleted

## 2018-01-21 DIAGNOSIS — Z6841 Body Mass Index (BMI) 40.0 and over, adult: Secondary | ICD-10-CM | POA: Diagnosis not present

## 2018-01-21 DIAGNOSIS — J189 Pneumonia, unspecified organism: Secondary | ICD-10-CM | POA: Diagnosis not present

## 2018-01-21 NOTE — Telephone Encounter (Signed)
Spoke with patient regarding tachy episode noted on LINQ on 01/14/18, transmitted via home monitor today.  ECG appears SVT, duration 5sec.  Patient reports she was asymptomatic with episode, but has had an URI for about a week and has been coughing.  She reports that she is still taking Xarelto for DVT.  Advised that I will review ECG with MD and call her back if any additional recommendations.  Patient is agreeable to plan and denies questions or concerns at this time.

## 2018-01-24 ENCOUNTER — Ambulatory Visit: Payer: Medicare Other | Admitting: Neurology

## 2018-01-24 DIAGNOSIS — H43812 Vitreous degeneration, left eye: Secondary | ICD-10-CM | POA: Diagnosis not present

## 2018-01-24 DIAGNOSIS — H52222 Regular astigmatism, left eye: Secondary | ICD-10-CM | POA: Diagnosis not present

## 2018-01-24 DIAGNOSIS — H35362 Drusen (degenerative) of macula, left eye: Secondary | ICD-10-CM | POA: Diagnosis not present

## 2018-01-24 DIAGNOSIS — E119 Type 2 diabetes mellitus without complications: Secondary | ICD-10-CM | POA: Diagnosis not present

## 2018-01-25 ENCOUNTER — Ambulatory Visit: Payer: Medicare Other | Admitting: Neurology

## 2018-01-26 NOTE — Telephone Encounter (Signed)
Dr. Lovena Le reviewed strip on 01/21/18, advised that ECG indicates SVT.  ECG placed in Dr. Kathalene Frames folder for review upon his return.

## 2018-02-01 DIAGNOSIS — J209 Acute bronchitis, unspecified: Secondary | ICD-10-CM | POA: Diagnosis not present

## 2018-02-01 DIAGNOSIS — R05 Cough: Secondary | ICD-10-CM | POA: Diagnosis not present

## 2018-02-04 LAB — CUP PACEART REMOTE DEVICE CHECK
Date Time Interrogation Session: 20190419173559
MDC IDC PG IMPLANT DT: 20170721

## 2018-02-07 ENCOUNTER — Ambulatory Visit (INDEPENDENT_AMBULATORY_CARE_PROVIDER_SITE_OTHER): Payer: Medicare Other | Admitting: Neurology

## 2018-02-07 ENCOUNTER — Encounter: Payer: Self-pay | Admitting: Neurology

## 2018-02-07 VITALS — BP 98/66 | HR 84 | Ht 63.0 in | Wt 255.0 lb

## 2018-02-07 DIAGNOSIS — I639 Cerebral infarction, unspecified: Secondary | ICD-10-CM

## 2018-02-07 MED ORDER — GABAPENTIN 300 MG PO CAPS: 600.0000 mg | ORAL_CAPSULE | Freq: Every day | ORAL | 4 refills | Status: DC

## 2018-02-07 NOTE — Patient Instructions (Signed)
Stroke Prevention Some health problems and behaviors may make it more likely for you to have a stroke. Below are ways to lessen your risk of having a stroke.  Be active for at least 30 minutes on most or all days.  Do not smoke. Try not to be around others who smoke.  Do not drink too much alcohol. ? Do not have more than 2 drinks a day if you are a man. ? Do not have more than 1 drink a day if you are a woman and are not pregnant.  Eat healthy foods, such as fruits and vegetables. If you were put on a specific diet, follow the diet as told.  Keep your cholesterol levels under control through diet and medicines. Look for foods that are low in saturated fat, trans fat, cholesterol, and are high in fiber.  If you have diabetes, follow all diet plans and take your medicine as told.  Ask your doctor if you need treatment to lower your blood pressure. If you have high blood pressure (hypertension), follow all diet plans and take your medicine as told by your doctor.  If you are 88-10 years old, have your blood pressure checked every 3-5 years. If you are age 53 or older, have your blood pressure checked every year.  Keep a healthy weight. Eat foods that are low in calories, salt, saturated fat, trans fat, and cholesterol.  Do not take drugs.  Avoid birth control pills, if this applies. Talk to your doctor about the risks of taking birth control pills.  Talk to your doctor if you have sleep problems (sleep apnea).  Take all medicine as told by your doctor. ? You may be told to take aspirin or blood thinner medicine. Take this medicine as told by your doctor. ? Understand your medicine instructions.  Make sure any other conditions you have are being taken care of.  Get help right away if:  You suddenly lose feeling (you feel numb) or have weakness in your face, arm, or leg.  Your face or eyelid hangs down to one side.  You suddenly feel confused.  You have trouble talking  (aphasia) or understanding what people are saying.  You suddenly have trouble seeing in one or both eyes.  You suddenly have trouble walking.  You are dizzy.  You lose your balance or your movements are clumsy (uncoordinated).  You suddenly have a very bad headache and you do not know the cause.  You have new chest pain.  Your heart feels like it is fluttering or skipping a beat (irregular heartbeat). Do not wait to see if the symptoms above go away. Get help right away. Call your local emergency services (911 in [place] Do not drive yourself to the hospital. This information is not intended to replace advice given to you by your health care provider. Make sure you discuss any questions you have with your health care provider. Document Released: 03/08/2012 Document Revised: 02/13/2016 Document Reviewed: 03/10/2013 Elsevier Interactive Patient Education  [employer].

## 2018-02-07 NOTE — Progress Notes (Signed)
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  In the past we discussed smoking which can cause cerebrovascular atherosclerosis.  We recommended the healthy weight and wellness center.  No signs or symptoms of sleep apnea, and had [name] negative sleep eval in the past.  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 She has had [name] loop recorder placed.  She is on Gabapentin for neuropathy, Xarelto for stroke prevention. Takes Vitamin B supplements. She had bariatric surgery, resolved OSA. Loop hasn;t shown any afib or aflutter. Continues the Xarelto. She goes yearly for MRi to evaluate for previous sinus mass. Gabapentin helps. She continues to take B12 daily. HgbA1c 6.2 up [name] bit. She follows for cholesterol. She is taking Calcium and vitamin D daily now since showing Osteopenia. Weight stable.   Interval history 01/18/2017:  Patient is here for follow up. 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 Feet are burning [name] little more. She has [name] hx of cryptogenic stroke likely embolic but has [name] long history of smoking which is likely the cause of her cerebrovascular atherosclerosis.  Recommend the Healthy Weight and [place] to address her obesity and multiple joint pain. She does not snore and is not tired during the day, had [name] borderline sleep evaluation in the past will monitor for OSA, The burning in her feet is worsening.  More noticeable at night. She is on Lipoic acid daily.   Interval history 07/20/2016: She has had neuropathy for years.She has had neuropathy for over 15 years or longer. It started in the feet. Numbness, burning in the feet, they feel cold to the touvh but she feels them burning, she  has pain in the feet. She tried Lyrica possible in [name] neuropathy drug trial and she believe she had side effects. Worse at night in bed. Cramping in the feet.   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 She does not know how long she had B12 deficiency. Her last B12 was normal 843. She takes oral supplements. Also HgbA1c 6.0. TSH 2.070. She also has [name] history of diabetes which improved after bariatric surgery but still elevated 6.0. HgbA1c was as high as 7.7 in the past.   Interval history 06/09/2016: Since being seen patient has had another stroke. She was admitted 04/10/2016. She had newly identified clot by LE by doppler but TCD bubble study was negative. She was discharged on Xarelto. Loop recorder was placed. She presented to the ED with slurred speech. Most symptoms have resolved. She cannot walk very well, her legs and feet are very numb. She has numbness in the feet and legs. She has imbalance. And she has had [name] history of B12 deficiency. She still has the mouth numbness and right hand sensory changes from previous stroke but the aphasia is resolved.   Ct Head Wo Contrast 04/08/2016 1. 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 2. New focal low-density in the left posterior frontal periventricular white matter, potentially [name] subacute small vessel infarct. Extensive chronic small vessel ischemic changes in the periventricular white matter are otherwise stable. No evidence of acute cortical stroke or hemorrhage.  Mr Brain Wo Contrast 04/08/2016 1. Confirmation of acute nonhemorrhagic white matter infarct involving the left corona radiata and posterior centrum semi of ally. 2. Otherwise stable severe periventricular and diffuse subcortical T2 hyperintensities, advanced for age. This represents the sequela of chronic microvascular ischemia. 3. 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   Ct Angio Head & Neck W Or Wo Contrast 04/09/2016 1. Positive for arterial abnormalities associated with the acute white matter ischemia: Severe stenosis at the origin of [name] left MCA middle sylvian division M2 branch. Moderate irregularity and stenosis in anterior division left M2/ M3 branches. No left MCA M1 stenosis or occlusion. 2. Otherwise carotid bifurcation and ICA siphon atherosclerosis without hemodynamically significant stenosis. 3. Negative posterior circulation. 4. Stable CT appearance of the brain since yesterday. No associated hemorrhage or mass effect. 5. Abnormal CT appearance of the sella turcica and left cavernous sinus corresponding to the chronic invasive pituitary adenoma. 6. No acute findings in the neck.   LE venous dopplers Right lower extremity is negativefor deep vein thrombosis. The left lower extremity is positivefor deep vein thrombosis involving [name] small segment of the left mid femoral vein.There is no evidence of Baker's cyst bilaterally  TEE Normal TEE. No cardioembolic source.  TCD bubble study - negative for PFO  BFX:OVANVB [name] [name] [name] 67 y.o.femalehere as [name] follow-up from the hospital after [name] stroke. She still has numbness in the right fingers and moth due to the left thalamic stroke. We discussed her strokes, she is due for Cardiac Monitoring and then possibly Loop. Will discuss with our Vascular doctors. Also discussed at length the invasive pituitary tumor shown, I reviewed imaging with patient and pointed out findingd, discussed next steps including MRI w/wo with pituitary protocol as well as referral to [place].She was not on an aspirin when this happened.   Reviewed notes, labs and imaging from outside physicians, which showed:  IMPRESSION: 1. Acute lacunar infarct in the left thalamus. 2. Possible subacute lacunar infarct in the right splenium of the corpus callosum. 3. Moderate  chronic small vessel ischemic disease. 4. Mass involving the sella, left cavernous sinus, and left sphenoid sinus, suspicious for invasive pituitary macroadenoma. Further evaluation with nonemergent pituitary protocol MRI (without and with contrast) is recommended. NEUROSURGERY REVIEWED AND FEELS THIS IS [name] MASS OF THE SPHENOID SINUS THAT HAS INVADED THE CAVERNOUS SINUS,NOT PITUITARY TUMOR 5. No major intracranial arterial occlusion or significant proximal posterior circulation stenosis. 6. Severe proximal left M2 MCA stenoses.  Review of Systems: Patient complains of symptoms per HPI as well as the following symptoms: No CP, no SOB, no fever, no chills. Pertinent negatives per HPI. All others negative.   Social History   Socioeconomic History  . Marital status: Single    Spouse name: Not on file  . Number of children: 0  . Years of education: college  . Highest education level: Not on file  Occupational History  . Occupation: Retired  Scientific laboratory technician  . Financial resource strain: Not on file  . Food insecurity:    Worry: Not on file    Inability: Not on file  . Transportation needs:    Medical: Not on file    Non-medical: Not on file  Tobacco Use  . Smoking status: Former Smoker    Packs/day: 1.00    Years: 40.00    Pack years: 40.00    Types: Cigarettes    Last attempt to quit:  08/2006    Years since quitting: 11.4  . Smokeless tobacco: Never Used  Substance and Sexual Activity  . Alcohol use: Yes    Alcohol/week: 0.0 oz    Comment: 04/08/2016 "5-6 drinks/year'  . Drug use: No    Types: Marijuana    Comment: 04/08/2016 "did some marijuana in college"  . Sexual activity: Never    Birth control/protection: Surgical  Lifestyle  . Physical activity:    Days per week: Not on file    Minutes per session: Not on file  . Stress: Not on file  Relationships  . Social connections:    Talks on phone: Not on file    Gets together: Not on file    Attends religious service: Not  on file    Active member of club or organization: Not on file    Attends meetings of clubs or organizations: Not on file    Relationship status: Not on file  . Intimate partner violence:    Fear of current or ex partner: Not on file    Emotionally abused: Not on file    Physically abused: Not on file    Forced sexual activity: Not on file  Other Topics Concern  . Not on file  Social History Narrative   Lives at home alone   Right-handed   Drinks 2 cups of coffee [name] day     Family History  Problem Relation Age of Onset  . Neuropathy Mother   . Prostate cancer Father   . Lymphoma Father   . Congestive Heart Failure Maternal Grandmother   . Lung cancer Maternal Grandfather     Past Medical History:  Diagnosis Date  . Anemia   . Arthritis    "knees, hips, possibly back" (04/08/2016)  . Atrial septal aneurysm   . B12 deficiency    [name]. following gastric bypass.  . Chronic lower back pain    spondylosis  . Daily headache    "short ones for the last month or 2" (04/08/2016)  . GERD (gastroesophageal reflux disease)    takes Protonix daily "just to protect my stomach; not for reflux" (04/08/2016)  . Heart murmur    "dx'd by 1 anesthesiologist years and years ago"  . History of loop recorder 03/2016  . Hyperlipidemia   . Hypertension   . Joint pain   . Macular degeneration    "just watching"  . Peripheral edema   . Peripheral neuropathy    not on any meds  . Pituitary mass ([place])    [name]. s/p endoscopic surgery 02/2016.  [name] Pneumonia 12/2017  . Sleep apnea    no CPAP since weight loss after bariatric surgery '12 (04/08/2016)  . Spondylosis   . Stroke ([place]) 04/07/2016   [name]. 01/2016 and 03/2016. during admission had + LE DVT.  [name] Type 2 diabetes, diet controlled ([place])   . Urinary incontinence     Past Surgical History:  Procedure Laterality Date  . CATARACT EXTRACTION W/ INTRAOCULAR LENS  IMPLANT, BILATERAL Bilateral ~ 2012  . COLONOSCOPY    . EP IMPLANTABLE DEVICE N/[name]  04/10/2016   Procedure: Loop Recorder Insertion;  Surgeon: Will Meredith Leeds, MD;  Location: [facility];  Service: Cardiovascular;  Laterality: N/[name];  . ESOPHAGOGASTRODUODENOSCOPY     in the 70's  . JOINT REPLACEMENT    . 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 SINUS ENDO W/FUSION  02/28/2016   Procedure: ENDOSCOPIC SINUS SURGERY WITH NAVIGATION;  Surgeon: Ruby Cola, MD;  Location: [facility];  Service: ENT;;  . TEE WITHOUT CARDIOVERSION N/[name] 04/10/2016   Procedure: TRANSESOPHAGEAL ECHOCARDIOGRAM (TEE) POSSIBLE LOOP;  Surgeon: Sanda Klein, MD;  Location: [facility];  Service: Cardiovascular;  Laterality: N/[name];  . TONSILLECTOMY  1957  . TOTAL HIP ARTHROPLASTY Right 2008  . TOTAL HIP REVISION Right 03/30/2016   Procedure: RIGHT TOTAL HIP REVISION;  Surgeon: Frederik Pear, MD;  Location: [facility];  Service: Orthopedics;  Laterality: Right;  . TOTAL KNEE ARTHROPLASTY Bilateral 2001  . VAGINAL HYSTERECTOMY     "left my ovaries"    Current Outpatient Medications  Medication Sig Dispense Refill  . albuterol (PROVENTIL HFA;VENTOLIN HFA) 108 (90 Base) MCG/ACT inhaler Inhale 2 puffs into the lungs as needed (cough).     [name] b complex vitamins capsule Take 1 capsule by mouth daily. 30 capsule 2  . beta carotene w/minerals (OCUVITE) tablet Take 1 tablet by mouth daily.    . Calcium-Vitamin D-Vitamin K (VIACTIV PO) Take by mouth.    . Cholecalciferol (VITAMIN D3 PO) Take by mouth.    . Coenzyme Q10 (CO Q-10) 400 MG CAPS Take by mouth.    . gabapentin (NEURONTIN) 300 MG capsule Take 2 capsules (600 mg total) by mouth at bedtime. 180 capsule 4  . losartan-hydrochlorothiazide (HYZAAR) 100-25 MG tablet Take 1 tablet by mouth daily.    . magnesium oxide (MAG-OX) 400 MG tablet Take 400 mg by mouth at bedtime.     [name] OVER THE COUNTER MEDICATION P5P vitamin    . rosuvastatin (CRESTOR) 20 MG tablet Take 1 tablet (20 mg total) by mouth daily. (Patient taking differently: Take 20 mg by mouth at bedtime. ) 30 tablet  0  . XARELTO 20 MG TABS tablet Take 20 mg by mouth daily.    [name] PREVNAR 13 SUSP injection      No current facility-administered medications for this visit.     Allergies as of 02/07/2018 - Review Complete 02/07/2018  Allergen Reaction Noted  . Ambien [zolpidem] Other (See Comments) 03/27/2016  . Atorvastatin Other (See Comments) 02/24/2016  . Codeine Nausea And Vomiting 05/29/2011  . Penicillins Itching and Other (See Comments) 05/29/2011  . Simvastatin Other (See Comments) 03/27/2016  . Byetta 10 mcg pen [exenatide] Nausea Only 03/27/2016    Vitals: BP 98/66 (BP Location: Right Arm, Patient Position: Sitting) Comment: manual  Pulse 84   Ht _0  (1.6 m)   Wt 255 lb (115.7 kg)   BMI 45.17 kg/m  Last Weight:  Wt Readings from Last 1 Encounters:  02/07/18 255 lb (115.7 kg)   Last Height:   Ht Readings from Last 1 Encounters:  02/07/18 _1  (1.6 m)    Neuro: Detailed Neurologic Exam  Speech: Speech is normal; fluent and spontaneous with normal comprehension.  Cognition: The patient is oriented to person, place, and time;  recent and remote memory intact;  language fluent;  normal attention, concentration,  fund of knowledge Cranial Nerves: The pupils are equal, round, and reactive to light. The fundi are normal and spontaneous venous pulsations are present. Visual fields are full to finger confrontation. Extraocular movements are intact. Trigeminal sensation is intact and the muscles of mastication are normal. The face is symmetric. The palate elevates in the midline. Hearing intact. Voice is normal. Shoulder shrug is normal. The tongue has normal motion without fasciculations.   Coordination: Normal finger to nose and heel to shin. Normal rapid alternating movements.   Gait: Heel-toe and tandem gait are normal.   Motor  Observation: No asymmetry, no atrophy, and no involuntary movements noted. Tone: Normal muscle tone.    Posture: Posture is normal. normal erect  Strength: Strength is V/V in the upper and lower limbs.   Sensation: Decreased pin prick to the knees, absent vibration to the ankles, intact propriceotpion.  Reflex Exam:  DTR's: Absent AJs  Toes: The toes are downgoing bilaterally.  Clonus: Clonus is absent.  Assessment and Plan:Ms.[name] [name] [name] 67 y.o.femalewith history of HTN, HLD,TIA/stroke, diet controlled DM type 2, peripheral neuropathy,s/p gastric bypass. . L corona radiata/posterior centrum semiovale infarct, etiology not clear. She had left thalamic and right splenium punctate infarcts in 01/2016, suspicious for cardioembolic infarcts. Pt does have severe L M2 inferior division stenosis and recent hypotension, but the distribution of M2 inferior does not fit into these infarcts. TEE normal and loop placed. Found to have DVT likely due to recent hip surgery but TCD bubble study no PFO.    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 Risk factors include b12 deficiency and diabetes. Will test for other causes of neuropathy. neurontin at night.Alpha-lipoic acid daily. Discussed side effects as per patient instructions.  - Loop recorder placed 7/21 no afib or aflutter for close to 2 years - TCD bubble negative for PF - LDL, HgbA1c controlled - BP goal 130-150 due to left M2 severe stenosis - Continue statin  - HgbA1c 6.2, goal < 7.0 - Hx OSA, no CPAP since bariatric surgery in 2012, she denies any signs or symptoms no fatigue, not nodding off, nor morning headaches, no excessive fatigue, no known snoring. (she lost weight 336 was her max weight now 255) - Recommend the healthy weight and wellness center: sent referral  Orders Placed This Encounter  Procedures  . Ambulatory referral to Family Practice   - continue Xarelto - Continue neurontin for neuropathy  Cc; Dr. Tawny Asal, MD  [facility] [address] [place], [place] [address]  Phone [phone] Fax [phone]  [name] total of 25 minutes was spent face-to-face with this patient. Over half this time was spent on counseling patient on the  diagnosis and different diagnostic and therapeutic options available.

## 2018-02-09 ENCOUNTER — Ambulatory Visit (INDEPENDENT_AMBULATORY_CARE_PROVIDER_SITE_OTHER): Payer: Medicare Other | Admitting: *Deleted

## 2018-02-09 DIAGNOSIS — I6340 Cerebral infarction due to embolism of unspecified cerebral artery: Secondary | ICD-10-CM | POA: Diagnosis not present

## 2018-02-10 NOTE — Progress Notes (Signed)
[facility] Summary Report / Loop Recorder

## 2018-02-14 DIAGNOSIS — J029 Acute pharyngitis, unspecified: Secondary | ICD-10-CM | POA: Diagnosis not present

## 2018-03-07 LAB — CUP PACEART REMOTE DEVICE CHECK
Date Time Interrogation Session: 20190522194109
Implantable Pulse Generator Implant Date: 20170721

## 2018-03-08 DIAGNOSIS — J209 Acute bronchitis, unspecified: Secondary | ICD-10-CM | POA: Diagnosis not present

## 2018-03-14 ENCOUNTER — Ambulatory Visit (INDEPENDENT_AMBULATORY_CARE_PROVIDER_SITE_OTHER): Payer: Medicare Other | Admitting: *Deleted

## 2018-03-14 DIAGNOSIS — I6340 Cerebral infarction due to embolism of unspecified cerebral artery: Secondary | ICD-10-CM

## 2018-03-15 DIAGNOSIS — J40 Bronchitis, not specified as acute or chronic: Secondary | ICD-10-CM | POA: Diagnosis not present

## 2018-03-15 NOTE — Progress Notes (Signed)
[facility] Summary Report / Loop Recorder

## 2018-03-31 ENCOUNTER — Encounter (INDEPENDENT_AMBULATORY_CARE_PROVIDER_SITE_OTHER): Payer: Self-pay

## 2018-04-08 ENCOUNTER — Other Ambulatory Visit: Payer: Self-pay | Admitting: Cardiology

## 2018-04-13 DIAGNOSIS — M1612 Unilateral primary osteoarthritis, left hip: Secondary | ICD-10-CM | POA: Diagnosis not present

## 2018-04-14 ENCOUNTER — Encounter (INDEPENDENT_AMBULATORY_CARE_PROVIDER_SITE_OTHER): Payer: Medicare Other

## 2018-04-18 ENCOUNTER — Ambulatory Visit (INDEPENDENT_AMBULATORY_CARE_PROVIDER_SITE_OTHER): Payer: Medicare Other | Admitting: *Deleted

## 2018-04-18 DIAGNOSIS — I6340 Cerebral infarction due to embolism of unspecified cerebral artery: Secondary | ICD-10-CM

## 2018-04-18 DIAGNOSIS — M1612 Unilateral primary osteoarthritis, left hip: Secondary | ICD-10-CM | POA: Diagnosis not present

## 2018-04-18 NOTE — Progress Notes (Signed)
[facility] Summary Report / Loop Recorder

## 2018-04-27 ENCOUNTER — Encounter (INDEPENDENT_AMBULATORY_CARE_PROVIDER_SITE_OTHER): Payer: Self-pay | Admitting: Family Medicine

## 2018-04-27 ENCOUNTER — Other Ambulatory Visit (INDEPENDENT_AMBULATORY_CARE_PROVIDER_SITE_OTHER): Payer: Self-pay | Admitting: Family Medicine

## 2018-04-27 ENCOUNTER — Ambulatory Visit (INDEPENDENT_AMBULATORY_CARE_PROVIDER_SITE_OTHER): Payer: Medicare Other | Admitting: Family Medicine

## 2018-04-27 VITALS — BP 102/66 | HR 77 | Temp 98.1°F | Ht 63.0 in | Wt 258.0 lb

## 2018-04-27 DIAGNOSIS — E559 Vitamin D deficiency, unspecified: Secondary | ICD-10-CM | POA: Diagnosis not present

## 2018-04-27 DIAGNOSIS — Z6841 Body Mass Index (BMI) 40.0 and over, adult: Secondary | ICD-10-CM

## 2018-04-27 DIAGNOSIS — R5383 Other fatigue: Secondary | ICD-10-CM

## 2018-04-27 DIAGNOSIS — R0602 Shortness of breath: Secondary | ICD-10-CM | POA: Diagnosis not present

## 2018-04-27 DIAGNOSIS — I639 Cerebral infarction, unspecified: Secondary | ICD-10-CM | POA: Diagnosis not present

## 2018-04-27 DIAGNOSIS — Z1331 Encounter for screening for depression: Secondary | ICD-10-CM

## 2018-04-27 DIAGNOSIS — Z0289 Encounter for other administrative examinations: Secondary | ICD-10-CM

## 2018-04-27 DIAGNOSIS — E118 Type 2 diabetes mellitus with unspecified complications: Secondary | ICD-10-CM

## 2018-04-28 LAB — CBC WITH DIFFERENTIAL/PLATELET
Basophils Absolute: 0.1 10*3/uL (ref 0.0–0.2)
Basos: 1 %
EOS (ABSOLUTE): 0.2 10*3/uL (ref 0.0–0.4)
EOS: 2 %
Hematocrit: 37.9 % (ref 34.0–46.6)
Hemoglobin: 11.4 g/dL (ref 11.1–15.9)
IMMATURE GRANS (ABS): 0 10*3/uL (ref 0.0–0.1)
Immature Granulocytes: 0 %
LYMPHS: 20 %
Lymphocytes Absolute: 1.9 10*3/uL (ref 0.7–3.1)
MCH: 24.4 pg — ABNORMAL LOW (ref 26.6–33.0)
MCHC: 30.1 g/dL — ABNORMAL LOW (ref 31.5–35.7)
MCV: 81 fL (ref 79–97)
MONOS ABS: 0.7 10*3/uL (ref 0.1–0.9)
Monocytes: 7 %
NEUTROS PCT: 70 %
Neutrophils Absolute: 6.8 10*3/uL (ref 1.4–7.0)
PLATELETS: 275 10*3/uL (ref 150–450)
RBC: 4.67 x10E6/uL (ref 3.77–5.28)
RDW: 16.1 % — AB (ref 12.3–15.4)
WBC: 9.6 10*3/uL (ref 3.4–10.8)

## 2018-04-28 LAB — COMPREHENSIVE METABOLIC PANEL
ALT: 12 IU/L (ref 0–32)
AST: 23 IU/L (ref 0–40)
Albumin/Globulin Ratio: 1.9 (ref 1.2–2.2)
Albumin: 4.1 g/dL (ref 3.6–4.8)
Alkaline Phosphatase: 108 IU/L (ref 39–117)
BUN/Creatinine Ratio: 14 (ref 12–28)
BUN: 11 mg/dL (ref 8–27)
Bilirubin Total: 0.3 mg/dL (ref 0.0–1.2)
CALCIUM: 9.3 mg/dL (ref 8.7–10.3)
CO2: 24 mmol/L (ref 20–29)
CREATININE: 0.81 mg/dL (ref 0.57–1.00)
Chloride: 98 mmol/L (ref 96–106)
GFR calc Af Amer: 87 mL/min/{1.73_m2} (ref >59)
GFR, EST NON AFRICAN AMERICAN: 75 mL/min/{1.73_m2} (ref >59)
Globulin, Total: 2.2 g/dL (ref 1.5–4.5)
Glucose: 105 mg/dL — ABNORMAL HIGH (ref 65–99)
Potassium: 3.8 mmol/L (ref 3.5–5.2)
Sodium: 140 mmol/L (ref 134–144)
TOTAL PROTEIN: 6.3 g/dL (ref 6.0–8.5)

## 2018-04-28 LAB — CUP PACEART REMOTE DEVICE CHECK
Implantable Pulse Generator Implant Date: 20170721
MDC IDC SESS DTM: 20190624201020

## 2018-04-28 LAB — HEMOGLOBIN A1C
Est. average glucose Bld gHb Est-mCnc: 151 mg/dL
Hgb A1c MFr Bld: 6.9 % — ABNORMAL HIGH (ref 4.8–5.6)

## 2018-04-28 LAB — LIPID PANEL WITH LDL/HDL RATIO
Cholesterol, Total: 152 mg/dL (ref 100–199)
HDL: 60 mg/dL (ref >39)
LDL CALC: 63 mg/dL (ref 0–99)
LDL/HDL RATIO: 1.1 ratio (ref 0.0–3.2)
TRIGLYCERIDES: 144 mg/dL (ref 0–149)
VLDL CHOLESTEROL CAL: 29 mg/dL (ref 5–40)

## 2018-04-28 LAB — VITAMIN D 25 HYDROXY (VIT D DEFICIENCY, FRACTURES): Vit D, 25-Hydroxy: 31.3 ng/mL (ref 30.0–100.0)

## 2018-04-28 LAB — T3: T3, Total: 122 ng/dL (ref 71–180)

## 2018-04-28 LAB — T4, FREE: Free T4: 1.35 ng/dL (ref 0.82–1.77)

## 2018-04-28 LAB — TSH: TSH: 2 u[IU]/mL (ref 0.450–4.500)

## 2018-04-28 LAB — INSULIN, RANDOM: INSULIN: 7 u[IU]/mL (ref 2.6–24.9)

## 2018-04-28 NOTE — Progress Notes (Signed)
Office: [phone]  /  Fax: [phone]   Dear Dr. Jaynee Eagles,   Thank you for referring [name] [name] to our clinic. The following note includes my evaluation and treatment recommendations.  HPI:   Chief Complaint: OBESITY    [name] [name] has been referred by Berta Minor B. Jaynee Eagles, MD for consultation regarding her obesity and obesity related comorbidities.    [name] [name] (MR# [id number]) is a 67 y.o. female who presents on 04/27/2018 for obesity evaluation and treatment. Current BMI is Body mass index is 45.7 kg/m.[name] Jaylnn has been struggling with her weight for many years and has been unsuccessful in either losing weight, maintaining weight loss, or reaching her healthy weight goal.     Latashia has a history of gastric bypass in 2012, occasional restriction pending foods she eats.     Hildegarde attended our information session and states she is currently in the action stage of change and ready to dedicate time achieving and maintaining a healthier weight. Nila is interested in becoming our patient and working on intensive lifestyle modifications including (but not limited to) diet, exercise and weight loss.    Lilygrace states she struggles with family and or coworkers weight loss sabotage her desired weight loss is 83 lbs she has been heavy most of  her life she started gaining weight in 1960's her heaviest weight ever was 326 lbs she snacks frequently in the evenings she is frequently drinking liquids with calories she frequently makes poor food choices she struggles with emotional eating    Fatigue Floria feels her energy is lower than it should be. This has worsened with weight gain and has not worsened recently. Amaria admits to daytime somnolence and  admits to waking up still tired. Patient is at risk for obstructive sleep apnea. Patent has a history of symptoms of daytime fatigue. Patient generally gets 6 hours of sleep per night, and states they generally have nightime  awakenings. Snoring is present. Apneic episodes are not present. Epworth Sleepiness Score is 3.  Dyspnea on exertion Arda notes increasing shortness of breath with exercising and seems to be worsening over time with weight gain. She notes getting out of breath sooner with activity than she used to. This has not gotten worse recently. EKG not done, patient has a loop recorder. Anikka denies orthopnea.  Diabetes II Thailyn has a historical diagnosis of diabetes type II. Sonakshi is not on medications and denies any hypoglycemic episodes. She has been working on intensive lifestyle modifications including diet, exercise, and weight loss to help control her blood glucose levels.  Vitamin D Deficiency Caitlain has a diagnosis of vitamin D deficiency. She is on OTC Vit D replacement and denies nausea, vomiting or muscle weakness.  Depression Screen Yena's Food and Mood (modified PHQ-9) score was  Depression screen PHQ 2/9 04/27/2018  Decreased Interest 3  Down, Depressed, Hopeless 1  PHQ - 2 Score 4  Altered sleeping 0  Tired, decreased energy 3  Change in appetite 2  Feeling bad or failure about yourself  3  Trouble concentrating 0  Moving slowly or fidgety/restless 0  Suicidal thoughts 0  PHQ-9 Score 12  Difficult doing work/chores Somewhat difficult    ALLERGIES: Allergies  Allergen Reactions  . Ambien [Zolpidem] Other (See Comments)    NIGHT TERRORS  . Atorvastatin Other (See Comments)    Muscle weakness Muscle Aches  . Codeine Nausea And Vomiting  . Penicillins Itching and Other (See Comments)    Made nose run  uncontrollably Has patient had a PCN reaction causing immediate rash, facial/tongue/throat swelling, SOB or lightheadedness with hypotension: No Has patient had a PCN reaction causing severe rash involving mucus membranes or skin necrosis: No Has patient had a PCN reaction that required hospitalization No Has patient had a PCN reaction occurring within the last 10 years:  No If all of the above answers are "NO", then may proceed with Cephalosporin use.  . Simvastatin Other (See Comments)    Muscle weakness  . Byetta 10 Mcg Pen [Exenatide] Nausea Only    MEDICATIONS: Current Outpatient Medications on File Prior to Visit  Medication Sig Dispense Refill  . b complex vitamins capsule Take 1 capsule by mouth daily. 30 capsule 2  . beta carotene w/minerals (OCUVITE) tablet Take 1 tablet by mouth daily.    . Biotin (BIOTIN 5000) 5 MG CAPS Take by mouth.    . Calcium-Vitamin D-Vitamin K (VIACTIV PO) Take by mouth.    . Cholecalciferol (VITAMIN D3 PO) Take by mouth.    . Coenzyme Q10 (CO Q-10) 400 MG CAPS Take by mouth.    . gabapentin (NEURONTIN) 300 MG capsule Take 2 capsules (600 mg total) by mouth at bedtime. 180 capsule 4  . losartan-hydrochlorothiazide (HYZAAR) 100-25 MG tablet Take 1 tablet by mouth daily.    . magnesium oxide (MAG-OX) 400 MG tablet Take 400 mg by mouth at bedtime.     [name] OVER THE COUNTER MEDICATION P5P vitamin    . rosuvastatin (CRESTOR) 20 MG tablet Take 1 tablet (20 mg total) by mouth daily. (Patient taking differently: Take 20 mg by mouth at bedtime. ) 30 tablet 0  . XARELTO 20 MG TABS tablet Take 20 mg by mouth daily.     No current facility-administered medications on file prior to visit.     PAST MEDICAL HISTORY: Past Medical History:  Diagnosis Date  . Anemia   . Arthritis    "knees, hips, possibly back" (04/08/2016)  . Atrial septal aneurysm   . B12 deficiency    a. following gastric bypass.  . Back pain   . Chronic lower back pain    spondylosis  . Cold feet   . Daily headache    "short ones for the last month or 2" (04/08/2016)  . Depression   . Diabetes ([place])   . Dry mouth   . DVT (deep venous thrombosis) ([place])   . Easy bruising   . Floaters in visual field   . GERD (gastroesophageal reflux disease)    takes Protonix daily "just to protect my stomach; not for reflux" (04/08/2016)  . Hay fever   . Heart murmur      "dx'd by 1 anesthesiologist years and years ago"  . History of loop recorder 03/2016  . Hyperlipidemia   . Hypertension   . Joint pain   . Leg cramp   . Macular degeneration    "just watching"  . Neuropathy   . Nosebleed   . Palpitations   . Peripheral edema   . Peripheral neuropathy    not on any meds  . 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  [name] Pneumonia 12/2017  . Sleep apnea    no CPAP since weight loss after bariatric surgery '12 (04/08/2016)  . Spondylosis   . Stroke ([place]) 04/07/2016   a. 01/2016 and 03/2016. during admission had + LE DVT.  [name] Swelling of extremity   . TIA (transient ischemic attack)   . Trouble in  sleeping   . Type 2 diabetes, diet controlled ([place])   . Urinary incontinence   . Weakness     PAST SURGICAL HISTORY: Past Surgical History:  Procedure Laterality Date  . CATARACT EXTRACTION W/ INTRAOCULAR LENS  IMPLANT, BILATERAL Bilateral ~ 2012  . COLONOSCOPY    . EP IMPLANTABLE DEVICE N/A 04/10/2016   Procedure: Loop Recorder Insertion;  Surgeon: Will Meredith Leeds, MD;  Location: [facility];  Service: Cardiovascular;  Laterality: N/A;  . ESOPHAGOGASTRODUODENOSCOPY     in the 70's  . FACIAL COSMETIC SURGERY    . JOINT REPLACEMENT    . 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 SINUS ENDO W/FUSION  02/28/2016   Procedure: ENDOSCOPIC SINUS SURGERY WITH NAVIGATION;  Surgeon: Ruby Cola, MD;  Location: [facility];  Service: ENT;;  . TEE WITHOUT CARDIOVERSION N/A 04/10/2016   Procedure: TRANSESOPHAGEAL ECHOCARDIOGRAM (TEE) POSSIBLE LOOP;  Surgeon: Sanda Klein, MD;  Location: [facility];  Service: Cardiovascular;  Laterality: N/A;  . TONSILLECTOMY  1957  . TOTAL HIP ARTHROPLASTY Right 2008  . TOTAL HIP REVISION Right 03/30/2016   Procedure: RIGHT TOTAL HIP REVISION;  Surgeon: Frederik Pear, MD;  Location: [facility];  Service: Orthopedics;  Laterality: Right;  . TOTAL KNEE ARTHROPLASTY Bilateral 2001  . VAGINAL HYSTERECTOMY     "left my  ovaries"    SOCIAL HISTORY: Social History   Tobacco Use  . Smoking status: Former Smoker    Packs/day: 1.00    Years: 40.00    Pack years: 40.00    Types: Cigarettes    Last attempt to quit: 08/2006    Years since quitting: 11.6  . Smokeless tobacco: Never Used  Substance Use Topics  . Alcohol use: Yes    Alcohol/week: 0.0 standard drinks    Comment: 04/08/2016 "5-6 drinks/year'  . Drug use: No    Types: Marijuana    Comment: 04/08/2016 "did some marijuana in college"    FAMILY HISTORY: Family History  Problem Relation Age of Onset  . Neuropathy Mother   . Hypertension Mother   . Hyperlipidemia Mother   . Thyroid disease Mother   . Prostate cancer Father   . Lymphoma Father   . Hypertension Father   . Hyperlipidemia Father   . Cancer Father   . Obesity Father   . Congestive Heart Failure Maternal Grandmother   . Lung cancer Maternal Grandfather     ROS: Review of Systems  Constitutional: Positive for malaise/fatigue. Negative for weight loss.       + Trouble sleeping  HENT: Positive for nosebleeds.        + Hay fever + Dry mouth  Eyes:       + Wear glasses or contacts + Floaters  Respiratory: Positive for shortness of breath.   Cardiovascular: Negative for orthopnea.       + Leg cramping + Very cold feet or hands  Gastrointestinal: Negative for nausea and vomiting.  Genitourinary: Positive for frequency.  Musculoskeletal: Positive for back pain.       Negative muscle weakness + Muscle or joint pain  Skin: Positive for itching.  Neurological: Positive for weakness.  Endo/Heme/Allergies: Bruises/bleeds easily.       Negative hypoglycemia    PHYSICAL EXAM: Blood pressure 102/66, pulse 77, temperature 98.1 F (36.7 C), temperature source Oral, height _0  (1.6 m), weight 258 lb (117 kg), SpO2 95 %. Body mass index is 45.7 kg/m. Physical Exam  Constitutional: She is oriented to person, place, and time.  She appears well-developed and well-nourished.    HENT:  Head: Normocephalic and atraumatic.  Nose: Nose normal.  Eyes: EOM are normal. No scleral icterus.  Neck: Normal range of motion. Neck supple. No thyromegaly present.  Cardiovascular: Normal rate and regular rhythm.  Pulmonary/Chest: Effort normal. No respiratory distress.  Abdominal: Soft. There is no tenderness.  + Obesity  Musculoskeletal:  Range of Motion normal in all 4 extremities Trace edema noted in bilateral lower extremities  Neurological: She is alert and oriented to person, place, and time. Coordination normal.  Skin: Skin is warm and dry.  Psychiatric: She has a normal mood and affect. Her behavior is normal.  Vitals reviewed.   RECENT LABS AND TESTS: BMET    Component Value Date/Time   NA 140 04/27/2018 0000   NA 139 04/29/2017 0944   K 3.8 04/27/2018 0000   K 3.9 04/29/2017 0944   CL 98 04/27/2018 0000   CO2 24 04/27/2018 0000   CO2 29 04/29/2017 0944   GLUCOSE 105 (H) 04/27/2018 0000   GLUCOSE 120 04/29/2017 0944   BUN 11 04/27/2018 0000   BUN 15.8 04/29/2017 0944   CREATININE 0.81 04/27/2018 0000   CREATININE 0.8 04/29/2017 0944   CALCIUM 9.3 04/27/2018 0000   CALCIUM 9.2 04/29/2017 0944   GFRNONAA 75 04/27/2018 0000   GFRAA 87 04/27/2018 0000   Lab Results  Component Value Date   HGBA1C 6.9 (H) 04/27/2018   Lab Results  Component Value Date   INSULIN 7.0 04/27/2018   CBC    Component Value Date/Time   WBC 9.6 04/27/2018 0000   WBC 7.9 04/29/2017 0944   WBC 16.0 (H) 04/13/2016 1105   RBC 4.67 04/27/2018 0000   RBC 4.53 04/29/2017 0944   RBC 3.65 (L) 04/13/2016 1105   HGB 11.4 04/27/2018 0000   HGB 11.9 04/29/2017 0944   HCT 37.9 04/27/2018 0000   HCT 36.9 04/29/2017 0944   PLT 275 04/27/2018 0000   MCV 81 04/27/2018 0000   MCV 81.5 04/29/2017 0944   MCH 24.4 (L) 04/27/2018 0000   MCH 26.3 04/29/2017 0944   MCH 26.3 04/13/2016 1105   MCHC 30.1 (L) 04/27/2018 0000   MCHC 32.2 04/29/2017 0944   MCHC 31.5 04/13/2016 1105    RDW 16.1 (H) 04/27/2018 0000   RDW 14.2 04/29/2017 0944   LYMPHSABS 1.9 04/27/2018 0000   LYMPHSABS 1.7 04/29/2017 0944   MONOABS 0.6 04/29/2017 0944   EOSABS 0.2 04/27/2018 0000   BASOSABS 0.1 04/27/2018 0000   BASOSABS 0.0 04/29/2017 0944   Iron/TIBC/Ferritin/ %Sat    Component Value Date/Time   IRON 51 04/29/2017 0944   TIBC 343 04/29/2017 0944   FERRITIN 23 04/29/2017 0944   IRONPCTSAT 15 (L) 04/29/2017 0944   IRONPCTSAT 12 (L) 05/29/2011 1436   Lipid Panel     Component Value Date/Time   CHOL 152 04/27/2018 0000   TRIG 144 04/27/2018 0000   HDL 60 04/27/2018 0000   CHOLHDL 2.7 04/09/2016 0155   VLDL 27 04/09/2016 0155   LDLCALC 63 04/27/2018 0000   Hepatic Function Panel     Component Value Date/Time   PROT 6.3 04/27/2018 0000   PROT 6.8 04/29/2017 0944   ALBUMIN 4.1 04/27/2018 0000   ALBUMIN 3.4 (L) 04/29/2017 0944   AST 23 04/27/2018 0000   AST 17 04/29/2017 0944   ALT 12 04/27/2018 0000   ALT 14 04/29/2017 0944   ALKPHOS 108 04/27/2018 0000   ALKPHOS 116 04/29/2017 0944  BILITOT 0.3 04/27/2018 0000   BILITOT 0.41 04/29/2017 0944      Component Value Date/Time   TSH 2.000 04/27/2018 0000   TSH 2.070 02/01/2016 1418   TSH 3.275  03/14/2010 2104    ECG  shows NSR with a rate of (unable to obtain, patient has loop recorder) INDIRECT CALORIMETER done today shows a VO2 of 216 and a REE of 1505.  Her calculated basal metabolic rate is 7106 thus her basal metabolic rate is worse than expected.    ASSESSMENT AND PLAN: Other fatigue - Plan: Comprehensive metabolic panel, CBC with Differential/Platelet, Lipid panel, T3, T4, free, TSH  Shortness of breath on exertion  Controlled type 2 diabetes mellitus with complication, without long-term current use of insulin (HCC) - Plan: Hemoglobin A1c, Insulin, random, Lipid panel  Vitamin D deficiency - Plan: VITAMIN D 25 Hydroxy (Vit-D Deficiency, Fractures)  Screening for depression  Class 3 severe obesity with  serious comorbidity and body mass index (BMI) of 45.0 to 49.9 in adult, unspecified obesity type (HCC)  PLAN:  Fatigue Antonisha was informed that her fatigue may be related to obesity, depression or many other causes. Labs will be ordered, and in the meanwhile Yaris has agreed to work on diet, exercise and weight loss to help with fatigue. Proper sleep hygiene was discussed including the need for 7-8 hours of quality sleep each night. A sleep study was not ordered based on symptoms and Epworth score.  Dyspnea on exertion Demya's shortness of breath appears to be obesity related and exercise induced. She has agreed to work on weight loss and gradually increase exercise to treat her exercise induced shortness of breath. If Elisabella follows our instructions and loses weight without improvement of her shortness of breath, we will plan to refer to pulmonology. We will monitor this condition regularly. Luellen agrees to this plan.  Diabetes II Bridgett has been given extensive diabetes education by myself today including ideal fasting and post-prandial blood glucose readings, individual ideal Hgb A1c goals and hypoglycemia prevention. We discussed the importance of good blood sugar control to decrease the likelihood of diabetic complications such as nephropathy, neuropathy, limb loss, blindness, coronary artery disease, and death. We discussed the importance of intensive lifestyle modification including diet, exercise and weight loss as the first line treatment for diabetes. We will check labs today and [name] agrees to follow up with our clinic in 2 weeks.  Vitamin D Deficiency Doninique was informed that low vitamin D levels contributes to fatigue and are associated with obesity, breast, and colon cancer. She will follow up for routine testing of vitamin D, at least 2-3 times per year. She was informed of the risk of over-replacement of vitamin D and agrees to not increase her dose unless she discusses this with  [place] first. We will check labs today and Cheney agrees to follow up with our clinic in 2 weeks.  Depression Screen Shanita had a moderately positive depression screening. Depression is commonly associated with obesity and often results in emotional eating behaviors. We will monitor this closely and work on CBT to help improve the non-hunger eating patterns. Referral to Psychology may be required if no improvement is seen as she continues in our clinic.  Obesity Shaelyn is currently in the action stage of change and her goal is to continue with weight loss efforts. I recommend Jovon begin the structured treatment plan as follows:  She has agreed to follow the Category 1 plan + 100 calories Antoinette has been instructed  to eventually work up to a goal of 150 minutes of combined cardio and strengthening exercise per week for weight loss and overall health benefits. We discussed the following Behavioral Modification Strategies today: increasing lean protein intake, increasing vegetables, work on meal planning and easy cooking plans, and planning for success   She was informed of the importance of frequent follow up visits to maximize her success with intensive lifestyle modifications for her multiple health conditions. She was informed we would discuss her lab results at her next visit unless there is a critical issue that needs to be addressed sooner. Nhyla agreed to keep her next visit at the agreed upon time to discuss these results.    OBESITY BEHAVIORAL INTERVENTION VISIT  Today's visit was # 1 out of 22.  Starting weight: 258 lbs Starting date: 04/27/18 Today's weight : 258 lbs  Today's date: 04/27/2018 Total lbs lost to date: 0 (Patients must lose 7 lbs in the first 6 months to continue with counseling)   ASK: We discussed the diagnosis of obesity with Jefm Bryant today and Lorianna agreed to give [place] permission to discuss obesity behavioral modification therapy today.  ASSESS: Christna  has the diagnosis of obesity and her BMI today is 45.71 Corneshia is in the action stage of change   ADVISE: Analys was educated on the multiple health risks of obesity as well as the benefit of weight loss to improve her health. She was advised of the need for long term treatment and the importance of lifestyle modifications.  AGREE: Multiple dietary modification options and treatment options were discussed and  Malkia agreed to the above obesity treatment plan.   I, Trixie Dredge, am acting as transcriptionist for Ilene Qua, MD  I have reviewed the above documentation for accuracy and completeness, and I agree with the above. - Ilene Qua, MD

## 2018-05-11 ENCOUNTER — Encounter (INDEPENDENT_AMBULATORY_CARE_PROVIDER_SITE_OTHER): Payer: Self-pay | Admitting: Family Medicine

## 2018-05-11 ENCOUNTER — Ambulatory Visit (INDEPENDENT_AMBULATORY_CARE_PROVIDER_SITE_OTHER): Payer: Medicare Other | Admitting: Family Medicine

## 2018-05-11 VITALS — BP 111/77 | HR 75 | Temp 97.5°F | Ht 63.0 in | Wt 248.0 lb

## 2018-05-11 DIAGNOSIS — Z9189 Other specified personal risk factors, not elsewhere classified: Secondary | ICD-10-CM | POA: Diagnosis not present

## 2018-05-11 DIAGNOSIS — E119 Type 2 diabetes mellitus without complications: Secondary | ICD-10-CM | POA: Diagnosis not present

## 2018-05-11 DIAGNOSIS — Z6841 Body Mass Index (BMI) 40.0 and over, adult: Secondary | ICD-10-CM | POA: Diagnosis not present

## 2018-05-11 DIAGNOSIS — E559 Vitamin D deficiency, unspecified: Secondary | ICD-10-CM | POA: Diagnosis not present

## 2018-05-11 DIAGNOSIS — I639 Cerebral infarction, unspecified: Secondary | ICD-10-CM

## 2018-05-11 MED ORDER — VITAMIN D (ERGOCALCIFEROL) 1.25 MG (50000 UNIT) PO CAPS: 50000.0000 [IU] | ORAL_CAPSULE | ORAL | 0 refills | Status: DC

## 2018-05-11 MED ORDER — METFORMIN HCL 500 MG PO TABS: 500.0000 mg | ORAL_TABLET | Freq: Every day | ORAL | 0 refills | Status: DC

## 2018-05-11 NOTE — Progress Notes (Signed)
Office: [phone]  /  Fax: [phone]   HPI:   Chief Complaint: OBESITY [name] [name] is here to discuss her progress with her obesity treatment plan. She is on the Category 1 plan + 100 calories and is following her eating plan approximately 99 % of the time. She states she is exercising 0 minutes 0 times per week. [name] [name] didn't like the food of the meal plan. She found meal plan to be too much food and couldn't eat all the food. She is looking for options to decrease meat and increase beans. She notes minimal hunger between breakfast and lunch.  Her weight is 248 lb (112.5 kg) today and has had a weight loss of 10 pounds over a period of 2 weeks since her last visit. She has lost 10 lbs since starting treatment with [place].  Vitamin D Deficiency [name] [name] has a diagnosis of vitamin D deficiency. She is currently taking OTC Vit D. She notes fatigue and denies nausea, vomiting or muscle weakness.  At risk for osteopenia and osteoporosis [name] [name] is at higher risk of osteopenia and osteoporosis due to vitamin D deficiency.   Diabetes II [name] [name] has a diagnosis of diabetes type II. [name] [name] is not on medications at this time. She notes carbohydrate cravings and  denies any hypoglycemic episodes. Last A1c was 6.9. She has been working on intensive lifestyle modifications including diet, exercise, and weight loss to help control her blood glucose levels.  ALLERGIES: Allergies  Allergen Reactions  . Ambien [Zolpidem] Other (See Comments)    NIGHT TERRORS  . Atorvastatin Other (See Comments)    Muscle weakness Muscle Aches  . Codeine Nausea And Vomiting  . Penicillins Itching and Other (See Comments)    Made nose run uncontrollably Has patient had a PCN reaction causing immediate rash, facial/tongue/throat swelling, SOB or lightheadedness with hypotension: No Has patient had a PCN reaction causing severe rash involving mucus membranes or skin necrosis: No Has patient had a PCN reaction that required  hospitalization No Has patient had a PCN reaction occurring within the last 10 years: No If all of the above answers are "NO", then may proceed with Cephalosporin use.  . Simvastatin Other (See Comments)    Muscle weakness  . Byetta 10 Mcg Pen [Exenatide] Nausea Only    MEDICATIONS: Current Outpatient Medications on File Prior to Visit  Medication Sig Dispense Refill  . b complex vitamins capsule Take 1 capsule by mouth daily. 30 capsule 2  . beta carotene w/minerals (OCUVITE) tablet Take 1 tablet by mouth daily.    . Biotin (BIOTIN 5000) 5 MG CAPS Take by mouth.    . Calcium-Vitamin D-Vitamin K (VIACTIV PO) Take by mouth.    . Cholecalciferol (VITAMIN D3 PO) Take by mouth.    . Coenzyme Q10 (CO Q-10) 400 MG CAPS Take by mouth.    . gabapentin (NEURONTIN) 300 MG capsule Take 2 capsules (600 mg total) by mouth at bedtime. 180 capsule 4  . losartan-hydrochlorothiazide (HYZAAR) 100-25 MG tablet Take 1 tablet by mouth daily.    . magnesium oxide (MAG-OX) 400 MG tablet Take 400 mg by mouth at bedtime.     [name] OVER THE COUNTER MEDICATION P5P vitamin    . rosuvastatin (CRESTOR) 20 MG tablet Take 1 tablet (20 mg total) by mouth daily. (Patient taking differently: Take 20 mg by mouth at bedtime. ) 30 tablet 0  . XARELTO 20 MG TABS tablet Take 20 mg by mouth daily.     No current facility-administered  medications on file prior to visit.     PAST MEDICAL HISTORY: Past Medical History:  Diagnosis Date  . Anemia   . Arthritis    "knees, hips, possibly back" (04/08/2016)  . Atrial septal aneurysm   . B12 deficiency    a. following gastric bypass.  . Back pain   . Chronic lower back pain    spondylosis  . Cold feet   . Daily headache    "short ones for the last month or 2" (04/08/2016)  . Depression   . Diabetes ([place])   . Dry mouth   . DVT (deep venous thrombosis) ([place])   . Easy bruising   . Floaters in visual field   . GERD (gastroesophageal reflux disease)    takes Protonix daily  "just to protect my stomach; not for reflux" (04/08/2016)  . Hay fever   . Heart murmur    "dx'd by 1 anesthesiologist years and years ago"  . History of loop recorder 03/2016  . Hyperlipidemia   . Hypertension   . Joint pain   . Leg cramp   . Macular degeneration    "just watching"  . Neuropathy   . Nosebleed   . Palpitations   . Peripheral edema   . Peripheral neuropathy    not on any meds  . 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  [name] Pneumonia 12/2017  . Sleep apnea    no CPAP since weight loss after bariatric surgery '12 (04/08/2016)  . Spondylosis   . Stroke ([place]) 04/07/2016   a. 01/2016 and 03/2016. during admission had + LE DVT.  [name] Swelling of extremity   . TIA (transient ischemic attack)   . Trouble in sleeping   . Type 2 diabetes, diet controlled ([place])   . Urinary incontinence   . Weakness     PAST SURGICAL HISTORY: Past Surgical History:  Procedure Laterality Date  . CATARACT EXTRACTION W/ INTRAOCULAR LENS  IMPLANT, BILATERAL Bilateral ~ 2012  . COLONOSCOPY    . EP IMPLANTABLE DEVICE N/A 04/10/2016   Procedure: Loop Recorder Insertion;  Surgeon: Will Meredith Leeds, MD;  Location: [facility];  Service: Cardiovascular;  Laterality: N/A;  . ESOPHAGOGASTRODUODENOSCOPY     in the 70's  . FACIAL COSMETIC SURGERY    . JOINT REPLACEMENT    . 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 SINUS ENDO W/FUSION  02/28/2016   Procedure: ENDOSCOPIC SINUS SURGERY WITH NAVIGATION;  Surgeon: Ruby Cola, MD;  Location: [facility];  Service: ENT;;  . TEE WITHOUT CARDIOVERSION N/A 04/10/2016   Procedure: TRANSESOPHAGEAL ECHOCARDIOGRAM (TEE) POSSIBLE LOOP;  Surgeon: Sanda Klein, MD;  Location: [facility];  Service: Cardiovascular;  Laterality: N/A;  . TONSILLECTOMY  1957  . TOTAL HIP ARTHROPLASTY Right 2008  . TOTAL HIP REVISION Right 03/30/2016   Procedure: RIGHT TOTAL HIP REVISION;  Surgeon: Frederik Pear, MD;  Location: [facility];  Service: Orthopedics;  Laterality:  Right;  . TOTAL KNEE ARTHROPLASTY Bilateral 2001  . VAGINAL HYSTERECTOMY     "left my ovaries"    SOCIAL HISTORY: Social History   Tobacco Use  . Smoking status: Former Smoker    Packs/day: 1.00    Years: 40.00    Pack years: 40.00    Types: Cigarettes    Last attempt to quit: 08/2006    Years since quitting: 11.7  . Smokeless tobacco: Never Used  Substance Use Topics  . Alcohol use: Yes    Alcohol/week: 0.0 standard  drinks    Comment: 04/08/2016 "5-6 drinks/year'  . Drug use: No    Types: Marijuana    Comment: 04/08/2016 "did some marijuana in college"    FAMILY HISTORY: Family History  Problem Relation Age of Onset  . Neuropathy Mother   . Hypertension Mother   . Hyperlipidemia Mother   . Thyroid disease Mother   . Prostate cancer Father   . Lymphoma Father   . Hypertension Father   . Hyperlipidemia Father   . Cancer Father   . Obesity Father   . Congestive Heart Failure Maternal Grandmother   . Lung cancer Maternal Grandfather     ROS: Review of Systems  Constitutional: Positive for malaise/fatigue and weight loss.  Gastrointestinal: Negative for nausea and vomiting.  Musculoskeletal:       Negative muscle weakness  Endo/Heme/Allergies:       Negative muscle weakness    PHYSICAL EXAM: Blood pressure 111/77, pulse 75, temperature (!) 97.5 F (36.4 C), temperature source Oral, height _0  (1.6 m), weight 248 lb (112.5 kg), SpO2 99 %. Body mass index is 43.93 kg/m. Physical Exam  Constitutional: She is oriented to person, place, and time. She appears well-developed and well-nourished.  Cardiovascular: Normal rate.  Pulmonary/Chest: Effort normal.  Musculoskeletal: Normal range of motion.  Neurological: She is oriented to person, place, and time.  Skin: Skin is warm and dry.  Psychiatric: She has a normal mood and affect. Her behavior is normal.  Vitals reviewed.   RECENT LABS AND TESTS: BMET    Component Value Date/Time   NA 140 04/27/2018 0000    NA 139 04/29/2017 0944   K 3.8 04/27/2018 0000   K 3.9 04/29/2017 0944   CL 98 04/27/2018 0000   CO2 24 04/27/2018 0000   CO2 29 04/29/2017 0944   GLUCOSE 105 (H) 04/27/2018 0000   GLUCOSE 120 04/29/2017 0944   BUN 11 04/27/2018 0000   BUN 15.8 04/29/2017 0944   CREATININE 0.81 04/27/2018 0000   CREATININE 0.8 04/29/2017 0944   CALCIUM 9.3 04/27/2018 0000   CALCIUM 9.2 04/29/2017 0944   GFRNONAA 75 04/27/2018 0000   GFRAA 87 04/27/2018 0000   Lab Results  Component Value Date   HGBA1C 6.9 (H) 04/27/2018   HGBA1C 6.0 (H) 04/09/2016   HGBA1C 6.2 (H) 01/31/2016   HGBA1C (H) 03/15/2010    7.7 (NOTE)                                                                       According to the ADA Clinical Practice Recommendations for 2011, when HbA1c is used as a screening test:   >=6.5%   Diagnostic of Diabetes Mellitus           (if abnormal result  is confirmed)  5.7-6.4%   Increased risk of developing Diabetes Mellitus  References:Diagnosis and Classification of Diabetes Mellitus,Diabetes OLMB,8675,44(BEEFE 1):S62-S69 and Standards of Medical Care in         Diabetes - 2011,Diabetes Care,2011,34  (Suppl 1):S11-S61.   Lab Results  Component Value Date   INSULIN 7.0 04/27/2018   CBC    Component Value Date/Time   WBC 9.6 04/27/2018 0000   WBC 7.9 04/29/2017 0944   WBC 16.0 (H) 04/13/2016 1105  RBC 4.67 04/27/2018 0000   RBC 4.53 04/29/2017 0944   RBC 3.65 (L) 04/13/2016 1105   HGB 11.4 04/27/2018 0000   HGB 11.9 04/29/2017 0944   HCT 37.9 04/27/2018 0000   HCT 36.9 04/29/2017 0944   PLT 275 04/27/2018 0000   MCV 81 04/27/2018 0000   MCV 81.5 04/29/2017 0944   MCH 24.4 (L) 04/27/2018 0000   MCH 26.3 04/29/2017 0944   MCH 26.3 04/13/2016 1105   MCHC 30.1 (L) 04/27/2018 0000   MCHC 32.2 04/29/2017 0944   MCHC 31.5 04/13/2016 1105   RDW 16.1 (H) 04/27/2018 0000   RDW 14.2 04/29/2017 0944   LYMPHSABS 1.9 04/27/2018 0000   LYMPHSABS 1.7 04/29/2017 0944   MONOABS 0.6  04/29/2017 0944   EOSABS 0.2 04/27/2018 0000   BASOSABS 0.1 04/27/2018 0000   BASOSABS 0.0 04/29/2017 0944   Iron/TIBC/Ferritin/ %Sat    Component Value Date/Time   IRON 51 04/29/2017 0944   TIBC 343 04/29/2017 0944   FERRITIN 23 04/29/2017 0944   IRONPCTSAT 15 (L) 04/29/2017 0944   IRONPCTSAT 12 (L) 05/29/2011 1436   Lipid Panel     Component Value Date/Time   CHOL 152 04/27/2018 0000   TRIG 144 04/27/2018 0000   HDL 60 04/27/2018 0000   CHOLHDL 2.7 04/09/2016 0155   VLDL 27 04/09/2016 0155   LDLCALC 63 04/27/2018 0000   Hepatic Function Panel     Component Value Date/Time   PROT 6.3 04/27/2018 0000   PROT 6.8 04/29/2017 0944   ALBUMIN 4.1 04/27/2018 0000   ALBUMIN 3.4 (L) 04/29/2017 0944   AST 23 04/27/2018 0000   AST 17 04/29/2017 0944   ALT 12 04/27/2018 0000   ALT 14 04/29/2017 0944   ALKPHOS 108 04/27/2018 0000   ALKPHOS 116 04/29/2017 0944   BILITOT 0.3 04/27/2018 0000   BILITOT 0.41 04/29/2017 0944      Component Value Date/Time   TSH 2.000 04/27/2018 0000   TSH 2.070 02/01/2016 1418   TSH 3.275 03/14/2010 2104  Results for WILLIETTE, LOEWE (MRN [id number]) as of 05/11/2018 16:01  Ref. Range 04/27/2018 00:00  Vitamin D, 25-Hydroxy Latest Ref Range: 30.0 - 100.0 ng/mL 31.3    ASSESSMENT AND PLAN: Vitamin D deficiency - Plan: Vitamin D, Ergocalciferol, (DRISDOL) 50000 units CAPS capsule  Type 2 diabetes mellitus without complication, without long-term current use of insulin (HCC) - Plan: metFORMIN (GLUCOPHAGE) 500 MG tablet  At risk for osteoporosis  Class 3 severe obesity with serious comorbidity and body mass index (BMI) of 40.0 to 44.9 in adult, unspecified obesity type ([place])  PLAN:  Vitamin D Deficiency Jermya was informed that low vitamin D levels contributes to fatigue and are associated with obesity, breast, and colon cancer. [name] [name] agrees to start prescription Vit D _0 ,000 IU every week #4 with no refills. She will follow up for routine  testing of vitamin D, at least 2-3 times per year. She was informed of the risk of over-replacement of vitamin D and agrees to not increase her dose unless she discusses this with [place] first. [name] [name] agrees to follow up with our clinic in 2 weeks.  At risk for osteopenia and osteoporosis [name] [name] is at risk for osteopenia and osteoporsis due to her vitamin D deficiency. She was encouraged to take her vitamin D and follow her higher calcium diet and increase strengthening exercise to help strengthen her bones and decrease her risk of osteopenia and osteoporosis.  Diabetes II [name] [name] has been given extensive diabetes education  by myself today including ideal fasting and post-prandial blood glucose readings, individual ideal Hgb A1c goals and hypoglycemia prevention. We discussed the importance of good blood sugar control to decrease the likelihood of diabetic complications such as nephropathy, neuropathy, limb loss, blindness, coronary artery disease, and death. We discussed the importance of intensive lifestyle modification including diet, exercise and weight loss as the first line treatment for diabetes. [name] [name] agrees to start metformin 500 mg PO q AM #30 with no refills. [name] [name] agrees to follow up with our clinic in 2 weeks.  Obesity [name] [name] is currently in the action stage of change. As such, her goal is to continue with weight loss efforts She has agreed to follow the Category 1 plan + 300 calories, advised on high protein snacks. [name] [name] has been instructed to work up to a goal of 150 minutes of combined cardio and strengthening exercise per week for weight loss and overall health benefits. We discussed the following Behavioral Modification Strategies today: increasing vegetables, work on meal planning and easy cooking plans, better snacking choices, and planning for success   [name] [name] has agreed to follow up with our clinic in 2 weeks. She was informed of the importance of frequent follow up visits to  maximize her success with intensive lifestyle modifications for her multiple health conditions.   OBESITY BEHAVIORAL INTERVENTION VISIT  Today's visit was # 2   Starting weight: 258 lbs Starting date: 04/27/18 Today's weight : 248 lbs  Today's date: 05/11/2018 Total lbs lost to date: 10 At least 15 minutes were spent on discussing the following behavioral intervention visit.   ASK: We discussed the diagnosis of obesity with [name] [name] today and [name] [name] agreed to give [place] permission to discuss obesity behavioral modification therapy today.  ASSESS: [name] [name] has the diagnosis of obesity and her BMI today is 43.94 [name] [name] is in the action stage of change   ADVISE: [name] [name] was educated on the multiple health risks of obesity as well as the benefit of weight loss to improve her health. She was advised of the need for long term treatment and the importance of lifestyle modifications to improve her current health and to decrease her risk of future health problems.  AGREE: Multiple dietary modification options and treatment options were discussed and  [name] [name] agreed to follow the recommendations documented in the above note.  ARRANGE: [name] [name] was educated on the importance of frequent visits to treat obesity as outlined per CMS and USPSTF guidelines and agreed to schedule her next follow up appointment today.  I, [name] [name], am acting as transcriptionist for Ilene Qua, MD  I have reviewed the above documentation for accuracy and completeness, and I agree with the above. - Ilene Qua, MD

## 2018-05-18 ENCOUNTER — Other Ambulatory Visit: Payer: Self-pay | Admitting: Family Medicine

## 2018-05-18 DIAGNOSIS — Z1231 Encounter for screening mammogram for malignant neoplasm of breast: Secondary | ICD-10-CM

## 2018-05-19 ENCOUNTER — Ambulatory Visit (INDEPENDENT_AMBULATORY_CARE_PROVIDER_SITE_OTHER): Payer: Medicare Other | Admitting: *Deleted

## 2018-05-19 DIAGNOSIS — I6340 Cerebral infarction due to embolism of unspecified cerebral artery: Secondary | ICD-10-CM | POA: Diagnosis not present

## 2018-05-20 NOTE — Progress Notes (Signed)
[facility] Summary Report / Loop Recorder

## 2018-05-25 ENCOUNTER — Ambulatory Visit (INDEPENDENT_AMBULATORY_CARE_PROVIDER_SITE_OTHER): Payer: Medicare Other | Admitting: Family Medicine

## 2018-05-25 DIAGNOSIS — I639 Cerebral infarction, unspecified: Secondary | ICD-10-CM

## 2018-05-25 DIAGNOSIS — E559 Vitamin D deficiency, unspecified: Secondary | ICD-10-CM | POA: Diagnosis not present

## 2018-05-25 DIAGNOSIS — Z9189 Other specified personal risk factors, not elsewhere classified: Secondary | ICD-10-CM | POA: Diagnosis not present

## 2018-05-25 DIAGNOSIS — Z6841 Body Mass Index (BMI) 40.0 and over, adult: Secondary | ICD-10-CM | POA: Diagnosis not present

## 2018-05-25 DIAGNOSIS — E119 Type 2 diabetes mellitus without complications: Secondary | ICD-10-CM | POA: Diagnosis not present

## 2018-05-25 MED ORDER — METFORMIN HCL 500 MG PO TABS: 500.0000 mg | ORAL_TABLET | Freq: Every day | ORAL | 0 refills | Status: DC

## 2018-05-25 MED ORDER — VITAMIN D (ERGOCALCIFEROL) 1.25 MG (50000 UNIT) PO CAPS: 50000.0000 [IU] | ORAL_CAPSULE | ORAL | 0 refills | Status: DC

## 2018-05-25 NOTE — Progress Notes (Signed)
Office: [phone]  /  Fax: [phone]   HPI:   Chief Complaint: OBESITY [name] [name] is here to discuss her progress with her obesity treatment plan. She is on the Category 1 plan +300 calories and is following her eating plan approximately 95 % of the time. She states she is exercising 0 minutes 0 times per week. [name] [name] is disappointed with her lack of weight loss. [name] [name] is feeling like she is working hard, and she hasn't seen the scale change. Her weight is 248 lb (112.5 kg) today and has not lost weight since her last visit. She has lost 10 lbs since starting treatment with [place].  Vitamin D deficiency [name] [name] has a diagnosis of vitamin D deficiency. [name] [name] is currently taking vit D and she admits fatigue, but denies nausea, vomiting or muscle weakness.  At risk for osteopenia and osteoporosis [name] [name] is at higher risk of osteopenia and osteoporosis due to vitamin D deficiency.   Diabetes II [name] [name] has a diagnosis of diabetes type II. Trenee admits to carb cravings and denies any hypoglycemic episodes. Last A1c was at 6.9 [name] [name] denies any GI upset on metformin.She has been working on intensive lifestyle modifications including diet, exercise, and weight loss to help control her blood glucose levels.  ALLERGIES: Allergies  Allergen Reactions  . Ambien [Zolpidem] Other (See Comments)    NIGHT TERRORS  . Atorvastatin Other (See Comments)    Muscle weakness Muscle Aches  . Codeine Nausea And Vomiting  . Penicillins Itching and Other (See Comments)    Made nose run uncontrollably Has patient had a PCN reaction causing immediate rash, facial/tongue/throat swelling, SOB or lightheadedness with hypotension: No Has patient had a PCN reaction causing severe rash involving mucus membranes or skin necrosis: No Has patient had a PCN reaction that required hospitalization No Has patient had a PCN reaction occurring within the last 10 years: No If all of the above answers are "NO", then may  proceed with Cephalosporin use.  . Simvastatin Other (See Comments)    Muscle weakness  . Byetta 10 Mcg Pen [Exenatide] Nausea Only    MEDICATIONS: Current Outpatient Medications on File Prior to Visit  Medication Sig Dispense Refill  . b complex vitamins capsule Take 1 capsule by mouth daily. 30 capsule 2  . beta carotene w/minerals (OCUVITE) tablet Take 1 tablet by mouth daily.    . Biotin (BIOTIN 5000) 5 MG CAPS Take by mouth.    . Calcium-Vitamin D-Vitamin K (VIACTIV PO) Take by mouth.    . Cholecalciferol (VITAMIN D3 PO) Take by mouth.    . Coenzyme Q10 (CO Q-10) 400 MG CAPS Take by mouth.    . gabapentin (NEURONTIN) 300 MG capsule Take 2 capsules (600 mg total) by mouth at bedtime. 180 capsule 4  . losartan-hydrochlorothiazide (HYZAAR) 100-25 MG tablet Take 1 tablet by mouth daily.    . magnesium oxide (MAG-OX) 400 MG tablet Take 400 mg by mouth at bedtime.     . metFORMIN (GLUCOPHAGE) 500 MG tablet Take 1 tablet (500 mg total) by mouth daily with breakfast. 30 tablet 0  . OVER THE COUNTER MEDICATION P5P vitamin    . rosuvastatin (CRESTOR) 20 MG tablet Take 1 tablet (20 mg total) by mouth daily. (Patient taking differently: Take 20 mg by mouth at bedtime. ) 30 tablet 0  . Vitamin D, Ergocalciferol, (DRISDOL) 50000 units CAPS capsule Take 1 capsule (50,000 Units total) by mouth every 7 (seven) days. 4 capsule 0  . XARELTO 20 MG TABS  tablet Take 20 mg by mouth daily.     No current facility-administered medications on file prior to visit.     PAST MEDICAL HISTORY: Past Medical History:  Diagnosis Date  . Anemia   . Arthritis    "knees, hips, possibly back" (04/08/2016)  . Atrial septal aneurysm   . B12 deficiency    a. following gastric bypass.  . Back pain   . Chronic lower back pain    spondylosis  . Cold feet   . Daily headache    "short ones for the last month or 2" (04/08/2016)  . Depression   . Diabetes ([place])   . Dry mouth   . DVT (deep venous thrombosis) ([place])     . Easy bruising   . Floaters in visual field   . GERD (gastroesophageal reflux disease)    takes Protonix daily "just to protect my stomach; not for reflux" (04/08/2016)  . Hay fever   . Heart murmur    "dx'd by 1 anesthesiologist years and years ago"  . History of loop recorder 03/2016  . Hyperlipidemia   . Hypertension   . Joint pain   . Leg cramp   . Macular degeneration    "just watching"  . Neuropathy   . Nosebleed   . Palpitations   . Peripheral edema   . Peripheral neuropathy    not on any meds  . 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  [name] Pneumonia 12/2017  . Sleep apnea    no CPAP since weight loss after bariatric surgery '12 (04/08/2016)  . Spondylosis   . Stroke ([place]) 04/07/2016   a. 01/2016 and 03/2016. during admission had + LE DVT.  [name] Swelling of extremity   . TIA (transient ischemic attack)   . Trouble in sleeping   . Type 2 diabetes, diet controlled ([place])   . Urinary incontinence   . Weakness     PAST SURGICAL HISTORY: Past Surgical History:  Procedure Laterality Date  . CATARACT EXTRACTION W/ INTRAOCULAR LENS  IMPLANT, BILATERAL Bilateral ~ 2012  . COLONOSCOPY    . EP IMPLANTABLE DEVICE N/A 04/10/2016   Procedure: Loop Recorder Insertion;  Surgeon: Will Meredith Leeds, MD;  Location: [facility];  Service: Cardiovascular;  Laterality: N/A;  . ESOPHAGOGASTRODUODENOSCOPY     in the 70's  . FACIAL COSMETIC SURGERY    . JOINT REPLACEMENT    . 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 SINUS ENDO W/FUSION  02/28/2016   Procedure: ENDOSCOPIC SINUS SURGERY WITH NAVIGATION;  Surgeon: Ruby Cola, MD;  Location: [facility];  Service: ENT;;  . TEE WITHOUT CARDIOVERSION N/A 04/10/2016   Procedure: TRANSESOPHAGEAL ECHOCARDIOGRAM (TEE) POSSIBLE LOOP;  Surgeon: Sanda Klein, MD;  Location: [facility];  Service: Cardiovascular;  Laterality: N/A;  . TONSILLECTOMY  1957  . TOTAL HIP ARTHROPLASTY Right 2008  . TOTAL HIP REVISION Right 03/30/2016    Procedure: RIGHT TOTAL HIP REVISION;  Surgeon: Frederik Pear, MD;  Location: [facility];  Service: Orthopedics;  Laterality: Right;  . TOTAL KNEE ARTHROPLASTY Bilateral 2001  . VAGINAL HYSTERECTOMY     "left my ovaries"    SOCIAL HISTORY: Social History   Tobacco Use  . Smoking status: Former Smoker    Packs/day: 1.00    Years: 40.00    Pack years: 40.00    Types: Cigarettes    Last attempt to quit: 08/2006    Years since quitting: 11.7  . Smokeless tobacco: Never Used  Substance Use Topics  . Alcohol use: Yes    Alcohol/week: 0.0 standard drinks    Comment: 04/08/2016 "5-6 drinks/year'  . Drug use: No    Types: Marijuana    Comment: 04/08/2016 "did some marijuana in college"    FAMILY HISTORY: Family History  Problem Relation Age of Onset  . Neuropathy Mother   . Hypertension Mother   . Hyperlipidemia Mother   . Thyroid disease Mother   . Prostate cancer Father   . Lymphoma Father   . Hypertension Father   . Hyperlipidemia Father   . Cancer Father   . Obesity Father   . Congestive Heart Failure Maternal Grandmother   . Lung cancer Maternal Grandfather     ROS: Review of Systems  Constitutional: Negative for weight loss.  Gastrointestinal: Negative for diarrhea, nausea and vomiting.  Musculoskeletal:       Negative for muscle weakness  Endo/Heme/Allergies:       Positive for carb cravings Negative for hypoglycemia    PHYSICAL EXAM: Blood pressure 101/68, pulse 80, temperature 98.3 F (36.8 C), temperature source Oral, height 5' 3" (1.6 m), weight 248 lb (112.5 kg), SpO2 98 %. Body mass index is 43.93 kg/m. Physical Exam  Constitutional: She is oriented to person, place, and time. She appears well-developed and well-nourished.  Cardiovascular: Normal rate.  Pulmonary/Chest: Effort normal.  Musculoskeletal: Normal range of motion.  Neurological: She is oriented to person, place, and time.  Skin: Skin is warm and dry.  Psychiatric: She has a normal mood and  affect. Her behavior is normal.  Vitals reviewed.   RECENT LABS AND TESTS: BMET    Component Value Date/Time   NA 140 04/27/2018 0000   NA 139 04/29/2017 0944   K 3.8 04/27/2018 0000   K 3.9 04/29/2017 0944   CL 98 04/27/2018 0000   CO2 24 04/27/2018 0000   CO2 29 04/29/2017 0944   GLUCOSE 105 (H) 04/27/2018 0000   GLUCOSE 120 04/29/2017 0944   BUN 11 04/27/2018 0000   BUN 15.8 04/29/2017 0944   CREATININE 0.81 04/27/2018 0000   CREATININE 0.8 04/29/2017 0944   CALCIUM 9.3 04/27/2018 0000   CALCIUM 9.2 04/29/2017 0944   GFRNONAA 75 04/27/2018 0000   GFRAA 87 04/27/2018 0000   Lab Results  Component Value Date   HGBA1C 6.9 (H) 04/27/2018   HGBA1C 6.0 (H) 04/09/2016   HGBA1C 6.2 (H) 01/31/2016   HGBA1C (H) 03/15/2010    7.7 (NOTE)                                                                       According to the ADA Clinical Practice Recommendations for 2011, when HbA1c is used as a screening test:   >=6.5%   Diagnostic of Diabetes Mellitus           (if abnormal result  is confirmed)  5.7-6.4%   Increased risk of developing Diabetes Mellitus  References:Diagnosis and Classification of Diabetes Mellitus,Diabetes TRZN,3567,01(IDCVU 1):S62-S69 and Standards of Medical Care in         Diabetes - 2011,Diabetes Care,2011,34  (Suppl 1):S11-S61.   Lab Results  Component Value Date   INSULIN 7.0 04/27/2018   CBC    Component Value Date/Time  WBC 9.6 04/27/2018 0000   WBC 7.9 04/29/2017 0944   WBC 16.0 (H) 04/13/2016 1105   RBC 4.67 04/27/2018 0000   RBC 4.53 04/29/2017 0944   RBC 3.65 (L) 04/13/2016 1105   HGB 11.4 04/27/2018 0000   HGB 11.9 04/29/2017 0944   HCT 37.9 04/27/2018 0000   HCT 36.9 04/29/2017 0944   PLT 275 04/27/2018 0000   MCV 81 04/27/2018 0000   MCV 81.5 04/29/2017 0944   MCH 24.4 (L) 04/27/2018 0000   MCH 26.3 04/29/2017 0944   MCH 26.3 04/13/2016 1105   MCHC 30.1 (L) 04/27/2018 0000   MCHC 32.2 04/29/2017 0944   MCHC 31.5 04/13/2016 1105    RDW 16.1 (H) 04/27/2018 0000   RDW 14.2 04/29/2017 0944   LYMPHSABS 1.9 04/27/2018 0000   LYMPHSABS 1.7 04/29/2017 0944   MONOABS 0.6 04/29/2017 0944   EOSABS 0.2 04/27/2018 0000   BASOSABS 0.1 04/27/2018 0000   BASOSABS 0.0 04/29/2017 0944   Iron/TIBC/Ferritin/ %Sat    Component Value Date/Time   IRON 51 04/29/2017 0944   TIBC 343 04/29/2017 0944   FERRITIN 23 04/29/2017 0944   IRONPCTSAT 15 (L) 04/29/2017 0944   IRONPCTSAT 12 (L) 05/29/2011 1436   Lipid Panel     Component Value Date/Time   CHOL 152 04/27/2018 0000   TRIG 144 04/27/2018 0000   HDL 60 04/27/2018 0000   CHOLHDL 2.7 04/09/2016 0155   VLDL 27 04/09/2016 0155   LDLCALC 63 04/27/2018 0000   Hepatic Function Panel     Component Value Date/Time   PROT 6.3 04/27/2018 0000   PROT 6.8 04/29/2017 0944   ALBUMIN 4.1 04/27/2018 0000   ALBUMIN 3.4 (L) 04/29/2017 0944   AST 23 04/27/2018 0000   AST 17 04/29/2017 0944   ALT 12 04/27/2018 0000   ALT 14 04/29/2017 0944   ALKPHOS 108 04/27/2018 0000   ALKPHOS 116 04/29/2017 0944   BILITOT 0.3 04/27/2018 0000   BILITOT 0.41 04/29/2017 0944      Component Value Date/Time   TSH 2.000 04/27/2018 0000   TSH 2.070 02/01/2016 1418   TSH 3.275  03/14/2010 2104   Results for [name] [name], [name] [name] (MRN [id number]) as of 05/25/2018 17:01  Ref. Range 04/27/2018 00:00  Vitamin D, 25-Hydroxy Latest Ref Range: 30.0 - 100.0 ng/mL 31.3   ASSESSMENT AND PLAN: Vitamin D deficiency - Plan: Vitamin D, Ergocalciferol, (DRISDOL) 50000 units CAPS capsule  Type 2 diabetes mellitus without complication, without long-term current use of insulin (HCC) - Plan: metFORMIN (GLUCOPHAGE) 500 MG tablet  PLAN:  Vitamin D Deficiency Satonya was informed that low vitamin D levels contributes to fatigue and are associated with obesity, breast, and colon cancer. She agrees to continue to take prescription Vit D _0 ,000 IU every week #12 with no refills and will follow up for routine testing of vitamin D,  at least 2-3 times per year. She was informed of the risk of over-replacement of vitamin D and agrees to not increase her dose unless she discusses this with [place] first. [name] [name] agrees to follow up as directed.  At risk for osteopenia and osteoporosis [name] [name] was given extended  (15 minutes) osteoporosis prevention counseling today. [name] [name] is at risk for osteopenia and osteoporosis due to her vitamin D deficiency. She was encouraged to take her vitamin D and follow her higher calcium diet and increase strengthening exercise to help strengthen her bones and decrease her risk of osteopenia and osteoporosis.  Diabetes II [name] [name] has been given extensive diabetes  education by myself today including ideal fasting and post-prandial blood glucose readings, individual ideal Hgb A1c goals and hypoglycemia prevention. We discussed the importance of good blood sugar control to decrease the likelihood of diabetic complications such as nephropathy, neuropathy, limb loss, blindness, coronary artery disease, and death. We discussed the importance of intensive lifestyle modification including diet, exercise and weight loss as the first line treatment for diabetes. [name] [name] agrees to continue metformin 500 mg qAM #90 with no refills and follow up at the agreed upon time.  Obesity Caree is currently in the action stage of change. As such, her goal is to continue with weight loss efforts She has agreed to follow the Category 2 plan +100 calories [name] [name] has been instructed to work up to a goal of 150 minutes of combined cardio and strengthening exercise per week for weight loss and overall health benefits. We discussed the following Behavioral Modification Strategies today: planning for success, increasing lean protein intake, increasing vegetables and work on meal planning and easy cooking plans [name] [name] has agreed to follow up with our clinic in 2 weeks. She was informed of the importance of frequent follow up visits to  maximize her success with intensive lifestyle modifications for her multiple health conditions.   OBESITY BEHAVIORAL INTERVENTION VISIT  Today's visit was # 3   Starting weight: 258 lbs Starting date: 04/27/18 Today's weight : 248 lbs Today's date: 05/25/2018 Total lbs lost to date: 10   ASK: We discussed the diagnosis of obesity with [name] [name] today and [name] [name] agreed to give [place] permission to discuss obesity behavioral modification therapy today.  ASSESS: Chanah has the diagnosis of obesity and her BMI today is 43.94 Caryn is in the action stage of change   ADVISE: Jelisa was educated on the multiple health risks of obesity as well as the benefit of weight loss to improve her health. She was advised of the need for long term treatment and the importance of lifestyle modifications to improve her current health and to decrease her risk of future health problems.  AGREE: Multiple dietary modification options and treatment options were discussed and  [name] [name] agreed to follow the recommendations documented in the above note.  ARRANGE: [name] [name] was educated on the importance of frequent visits to treat obesity as outlined per CMS and USPSTF guidelines and agreed to schedule her next follow up appointment today.  I, Doreene Nest, am acting as transcriptionist for Eber Jones, MD  I have reviewed the above documentation for accuracy and completeness, and I agree with the above. - Ilene Qua, MD

## 2018-05-30 LAB — CUP PACEART REMOTE DEVICE CHECK
Date Time Interrogation Session: 20190727200704
MDC IDC PG IMPLANT DT: 20170721

## 2018-06-08 ENCOUNTER — Ambulatory Visit (INDEPENDENT_AMBULATORY_CARE_PROVIDER_SITE_OTHER): Payer: Medicare Other | Admitting: Family Medicine

## 2018-06-08 VITALS — BP 100/67 | HR 66 | Temp 97.8°F | Ht 63.0 in | Wt 245.0 lb

## 2018-06-08 DIAGNOSIS — I639 Cerebral infarction, unspecified: Secondary | ICD-10-CM

## 2018-06-08 DIAGNOSIS — E559 Vitamin D deficiency, unspecified: Secondary | ICD-10-CM | POA: Diagnosis not present

## 2018-06-08 DIAGNOSIS — I10 Essential (primary) hypertension: Secondary | ICD-10-CM

## 2018-06-08 DIAGNOSIS — Z6841 Body Mass Index (BMI) 40.0 and over, adult: Secondary | ICD-10-CM | POA: Diagnosis not present

## 2018-06-09 NOTE — Progress Notes (Signed)
Office: [phone]  /  Fax: [phone]   HPI:   Chief Complaint: OBESITY [name] [name] is here to discuss her progress with her obesity treatment plan. She is on the Category 2 plan +100 calories and is following her eating plan approximately 75 % of the time. She states she is exercising 0 minutes 0 times per week. [name] [name] is having a very significant, troublesome time with eating all of the meat on her sandwich and with her dinner meals. She is still not eating all of the protein on her Category 2 plan. Her weight is 245 lb (111.1 kg) today and has had a weight loss of 3 pounds over a period of 2 weeks since her last visit. She has lost 13 lbs since starting treatment with [name] [name].  Vitamin D deficiency [name] [name] has a diagnosis of vitamin D deficiency. [name] [name] is currently taking vit D and she admits to fatigue, but denies nausea, vomiting or muscle weakness.  Hypertension [name] [name] is a 67 y.o. female with hypertension. She is taking Hyzaar and stating currently. [name] [name] denies chest pain, chest pressure or headache. She is working weight loss to help control her blood pressure with the goal of decreasing her risk of heart attack and stroke. [name] [name] blood pressure is controlled today.  ALLERGIES: Allergies  Allergen Reactions  . Ambien [Zolpidem] Other (See Comments)    NIGHT TERRORS  . Atorvastatin Other (See Comments)    Muscle weakness Muscle Aches  . Codeine Nausea And Vomiting  . Penicillins Itching and Other (See Comments)    Made nose run uncontrollably Has patient had a PCN reaction causing immediate rash, facial/tongue/throat swelling, SOB or lightheadedness with hypotension: No Has patient had a PCN reaction causing severe rash involving mucus membranes or skin necrosis: No Has patient had a PCN reaction that required hospitalization No Has patient had a PCN reaction occurring within the last 10 years: No If all of the above answers are "NO", then may proceed with  Cephalosporin use.  . Simvastatin Other (See Comments)    Muscle weakness  . Byetta 10 Mcg Pen [Exenatide] Nausea Only    MEDICATIONS: Current Outpatient Medications on File Prior to Visit  Medication Sig Dispense Refill  . b complex vitamins capsule Take 1 capsule by mouth daily. 30 capsule 2  . beta carotene w/minerals (OCUVITE) tablet Take 1 tablet by mouth daily.    . Biotin (BIOTIN 5000) 5 MG CAPS Take by mouth.    . Calcium-Vitamin D-Vitamin K (VIACTIV PO) Take by mouth.    . Cholecalciferol (VITAMIN D3 PO) Take by mouth.    . Coenzyme Q10 (CO Q-10) 400 MG CAPS Take by mouth.    . gabapentin (NEURONTIN) 300 MG capsule Take 2 capsules (600 mg total) by mouth at bedtime. 180 capsule 4  . losartan-hydrochlorothiazide (HYZAAR) 100-25 MG tablet Take 1 tablet by mouth daily.    . magnesium oxide (MAG-OX) 400 MG tablet Take 400 mg by mouth at bedtime.     . metFORMIN (GLUCOPHAGE) 500 MG tablet Take 1 tablet (500 mg total) by mouth daily with breakfast. 90 tablet 0  . OVER THE COUNTER MEDICATION P5P vitamin    . rosuvastatin (CRESTOR) 20 MG tablet Take 1 tablet (20 mg total) by mouth daily. (Patient taking differently: Take 20 mg by mouth at bedtime. ) 30 tablet 0  . Vitamin D, Ergocalciferol, (DRISDOL) 50000 units CAPS capsule Take 1 capsule (50,000 Units total) by mouth every 7 (seven) days. 12 capsule 0  .  XARELTO 20 MG TABS tablet Take 20 mg by mouth daily.     No current facility-administered medications on file prior to visit.     PAST MEDICAL HISTORY: Past Medical History:  Diagnosis Date  . Anemia   . Arthritis    "knees, hips, possibly back" (04/08/2016)  . Atrial septal aneurysm   . B12 deficiency    a. following gastric bypass.  . Back pain   . Chronic lower back pain    spondylosis  . Cold feet   . Daily headache    "short ones for the last month or 2" (04/08/2016)  . Depression   . Diabetes ([place])   . Dry mouth   . DVT (deep venous thrombosis) ([place])   . Easy  bruising   . Floaters in visual field   . GERD (gastroesophageal reflux disease)    takes Protonix daily "just to protect my stomach; not for reflux" (04/08/2016)  . Hay fever   . Heart murmur    "dx'd by 1 anesthesiologist years and years ago"  . History of loop recorder 03/2016  . Hyperlipidemia   . Hypertension   . Joint pain   . Leg cramp   . Macular degeneration    "just watching"  . Neuropathy   . Nosebleed   . Palpitations   . Peripheral edema   . Peripheral neuropathy    not on any meds  . 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  [name] [name] Pneumonia 12/2017  . Sleep apnea    no CPAP since weight loss after bariatric surgery '12 (04/08/2016)  . Spondylosis   . Stroke ([place]) 04/07/2016   a. 01/2016 and 03/2016. during admission had + LE DVT.  [name] [name] Swelling of extremity   . TIA (transient ischemic attack)   . Trouble in sleeping   . Type 2 diabetes, diet controlled ([name] [name])   . Urinary incontinence   . Weakness     PAST SURGICAL HISTORY: Past Surgical History:  Procedure Laterality Date  . CATARACT EXTRACTION W/ INTRAOCULAR LENS  IMPLANT, BILATERAL Bilateral ~ 2012  . COLONOSCOPY    . EP IMPLANTABLE DEVICE N/A 04/10/2016   Procedure: Loop Recorder Insertion;  Surgeon: Will Meredith Leeds, MD;  Location: [facility];  Service: Cardiovascular;  Laterality: N/A;  . ESOPHAGOGASTRODUODENOSCOPY     in the 70's  . FACIAL COSMETIC SURGERY    . JOINT REPLACEMENT    . 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 SINUS ENDO W/FUSION  02/28/2016   Procedure: ENDOSCOPIC SINUS SURGERY WITH NAVIGATION;  Surgeon: Ruby Cola, MD;  Location: [facility];  Service: ENT;;  . TEE WITHOUT CARDIOVERSION N/A 04/10/2016   Procedure: TRANSESOPHAGEAL ECHOCARDIOGRAM (TEE) POSSIBLE LOOP;  Surgeon: Sanda Klein, MD;  Location: [facility];  Service: Cardiovascular;  Laterality: N/A;  . TONSILLECTOMY  1957  . TOTAL HIP ARTHROPLASTY Right 2008  . TOTAL HIP REVISION Right 03/30/2016   Procedure:  RIGHT TOTAL HIP REVISION;  Surgeon: Frederik Pear, MD;  Location: [facility];  Service: Orthopedics;  Laterality: Right;  . TOTAL KNEE ARTHROPLASTY Bilateral 2001  . VAGINAL HYSTERECTOMY     "left my ovaries"    SOCIAL HISTORY: Social History   Tobacco Use  . Smoking status: Former Smoker    Packs/day: 1.00    Years: 40.00    Pack years: 40.00    Types: Cigarettes    Last attempt to quit: 08/2006    Years since quitting: 11.8  . Smokeless  tobacco: Never Used  Substance Use Topics  . Alcohol use: Yes    Alcohol/week: 0.0 standard drinks    Comment: 04/08/2016 "5-6 drinks/year'  . Drug use: No    Types: Marijuana    Comment: 04/08/2016 "did some marijuana in college"    FAMILY HISTORY: Family History  Problem Relation Age of Onset  . Neuropathy Mother   . Hypertension Mother   . Hyperlipidemia Mother   . Thyroid disease Mother   . Prostate cancer Father   . Lymphoma Father   . Hypertension Father   . Hyperlipidemia Father   . Cancer Father   . Obesity Father   . Congestive Heart Failure Maternal Grandmother   . Lung cancer Maternal Grandfather     ROS: Review of Systems  Constitutional: Positive for malaise/fatigue and weight loss.  Cardiovascular: Negative for chest pain.       Negative for chest pressure  Gastrointestinal: Negative for nausea and vomiting.  Musculoskeletal:       Negative for muscle weakness  Neurological: Negative for headaches.    PHYSICAL EXAM: Blood pressure 100/67, pulse 66, temperature 97.8 F (36.6 C), temperature source Oral, height _0  (1.6 m), weight 245 lb (111.1 kg), SpO2 100 %. Body mass index is 43.4 kg/m. Physical Exam  Constitutional: She is oriented to person, place, and time. She appears well-developed and well-nourished.  Cardiovascular: Normal rate.  Pulmonary/Chest: Effort normal.  Musculoskeletal: Normal range of motion.  Neurological: She is oriented to person, place, and time.  Skin: Skin is warm and dry.    Psychiatric: She has a normal mood and affect. Her behavior is normal.  Vitals reviewed.   RECENT LABS AND TESTS: BMET    Component Value Date/Time   NA 140 04/27/2018 0000   NA 139 04/29/2017 0944   K 3.8 04/27/2018 0000   K 3.9 04/29/2017 0944   CL 98 04/27/2018 0000   CO2 24 04/27/2018 0000   CO2 29 04/29/2017 0944   GLUCOSE 105 (H) 04/27/2018 0000   GLUCOSE 120 04/29/2017 0944   BUN 11 04/27/2018 0000   BUN 15.8 04/29/2017 0944   CREATININE 0.81 04/27/2018 0000   CREATININE 0.8 04/29/2017 0944   CALCIUM 9.3 04/27/2018 0000   CALCIUM 9.2 04/29/2017 0944   GFRNONAA 75 04/27/2018 0000   GFRAA 87 04/27/2018 0000   Lab Results  Component Value Date   HGBA1C 6.9 (H) 04/27/2018   HGBA1C 6.0 (H) 04/09/2016   HGBA1C 6.2 (H) 01/31/2016   HGBA1C (H) 03/15/2010    7.7 (NOTE)                                                                       According to the ADA Clinical Practice Recommendations for 2011, when HbA1c is used as a screening test:   >=6.5%   Diagnostic of Diabetes Mellitus           (if abnormal result  is confirmed)  5.7-6.4%   Increased risk of developing Diabetes Mellitus  References:Diagnosis and Classification of Diabetes Mellitus,Diabetes HBZJ,6967,89(FYBOF 1):S62-S69 and Standards of Medical Care in         Diabetes - 2011,Diabetes Care,2011,34  (Suppl 1):S11-S61.   Lab Results  Component Value Date   INSULIN 7.0  04/27/2018   CBC    Component Value Date/Time   WBC 9.6 04/27/2018 0000   WBC 7.9 04/29/2017 0944   WBC 16.0 (H) 04/13/2016 1105   RBC 4.67 04/27/2018 0000   RBC 4.53 04/29/2017 0944   RBC 3.65 (L) 04/13/2016 1105   HGB 11.4 04/27/2018 0000   HGB 11.9 04/29/2017 0944   HCT 37.9 04/27/2018 0000   HCT 36.9 04/29/2017 0944   PLT 275 04/27/2018 0000   MCV 81 04/27/2018 0000   MCV 81.5 04/29/2017 0944   MCH 24.4 (L) 04/27/2018 0000   MCH 26.3 04/29/2017 0944   MCH 26.3 04/13/2016 1105   MCHC 30.1 (L) 04/27/2018 0000   MCHC 32.2  04/29/2017 0944   MCHC 31.5 04/13/2016 1105   RDW 16.1 (H) 04/27/2018 0000   RDW 14.2 04/29/2017 0944   LYMPHSABS 1.9 04/27/2018 0000   LYMPHSABS 1.7 04/29/2017 0944   MONOABS 0.6 04/29/2017 0944   EOSABS 0.2 04/27/2018 0000   BASOSABS 0.1 04/27/2018 0000   BASOSABS 0.0 04/29/2017 0944   Iron/TIBC/Ferritin/ %Sat    Component Value Date/Time   IRON 51 04/29/2017 0944   TIBC 343 04/29/2017 0944   FERRITIN 23 04/29/2017 0944   IRONPCTSAT 15 (L) 04/29/2017 0944   IRONPCTSAT 12 (L) 05/29/2011 1436   Lipid Panel     Component Value Date/Time   CHOL 152 04/27/2018 0000   TRIG 144 04/27/2018 0000   HDL 60 04/27/2018 0000   CHOLHDL 2.7 04/09/2016 0155   VLDL 27 04/09/2016 0155   LDLCALC 63 04/27/2018 0000   Hepatic Function Panel     Component Value Date/Time   PROT 6.3 04/27/2018 0000   PROT 6.8 04/29/2017 0944   ALBUMIN 4.1 04/27/2018 0000   ALBUMIN 3.4 (L) 04/29/2017 0944   AST 23 04/27/2018 0000   AST 17 04/29/2017 0944   ALT 12 04/27/2018 0000   ALT 14 04/29/2017 0944   ALKPHOS 108 04/27/2018 0000   ALKPHOS 116 04/29/2017 0944   BILITOT 0.3 04/27/2018 0000   BILITOT 0.41 04/29/2017 0944      Component Value Date/Time   TSH 2.000 04/27/2018 0000   TSH 2.070 02/01/2016 1418   TSH 3.275  03/14/2010 2104   Results for THEADORA, NOYES (MRN [id number]) as of 06/09/2018 10:59  Ref. Range 04/27/2018 00:00  Vitamin D, 25-Hydroxy Latest Ref Range: 30.0 - 100.0 ng/mL 31.3   ASSESSMENT AND PLAN: Vitamin D deficiency  Essential hypertension  Class 3 severe obesity with serious comorbidity and body mass index (BMI) of 40.0 to 44.9 in adult, unspecified obesity type ([place])  PLAN:  Vitamin D Deficiency Indea was informed that low vitamin D levels contributes to fatigue and are associated with obesity, breast, and colon cancer. She agrees to continue to take prescription Vit D _0 ,000 IU every week and will follow up for routine testing of vitamin D, at least 2-3 times per  year. She was informed of the risk of over-replacement of vitamin D and agrees to not increase her dose unless she discusses this with [name] [name] first.  Hypertension We discussed sodium restriction, working on healthy weight loss, and a regular exercise program as the means to achieve improved blood pressure control. Faduma agreed with this plan and agreed to follow up as directed. We will continue to monitor her blood pressure as well as her progress with the above lifestyle modifications. She will continue her medications as prescribed and will watch for signs of hypotension as she continues her lifestyle modifications.  I spent > than 50% of the 15 minute visit on counseling as documented in the note.  Obesity Arnisha is currently in the action stage of change. As such, her goal is to continue with weight loss efforts She has agreed to follow the Category 2 plan +100 calories Estefanie has been instructed to work up to a goal of 150 minutes of combined cardio and strengthening exercise per week for weight loss and overall health benefits. We discussed the following Behavioral Modification Strategies today: planning for success, increasing lean protein intake, increasing vegetables and work on meal planning and easy cooking plans  Salvatore has agreed to follow up with our clinic in 2 weeks. She was informed of the importance of frequent follow up visits to maximize her success with intensive lifestyle modifications for her multiple health conditions.   OBESITY BEHAVIORAL INTERVENTION VISIT  Today's visit was # 4   Starting weight: 258 lbs Starting date: 04/27/18 Today's weight : 245 lbs Today's date: 06/08/2018 Total lbs lost to date: 13   ASK: We discussed the diagnosis of obesity with [name] [name] today and Tynetta agreed to give [name] [name] permission to discuss obesity behavioral modification therapy today.  ASSESS: Timberlynn has the diagnosis of obesity and her BMI today is 43.41 Carine is in the  action stage of change   ADVISE: Evalie was educated on the multiple health risks of obesity as well as the benefit of weight loss to improve her health. She was advised of the need for long term treatment and the importance of lifestyle modifications to improve her current health and to decrease her risk of future health problems.  AGREE: Multiple dietary modification options and treatment options were discussed and  Kirstein agreed to follow the recommendations documented in the above note.  ARRANGE: Baylea was educated on the importance of frequent visits to treat obesity as outlined per CMS and USPSTF guidelines and agreed to schedule her next follow up appointment today.  I, Doreene Nest, am acting as transcriptionist for Eber Jones, MD  I have reviewed the above documentation for accuracy and completeness, and I agree with the above. - Ilene Qua, MD

## 2018-06-10 ENCOUNTER — Ambulatory Visit
Admission: RE | Admit: 2018-06-10 | Discharge: 2018-06-10 | Disposition: A | Discharge status: Home / Self Care | Payer: Medicare Other | Source: Ambulatory Visit | Attending: Family Medicine | Admitting: Family Medicine

## 2018-06-10 DIAGNOSIS — Z1231 Encounter for screening mammogram for malignant neoplasm of breast: Secondary | ICD-10-CM | POA: Diagnosis not present

## 2018-06-15 LAB — CUP PACEART REMOTE DEVICE CHECK
Implantable Pulse Generator Implant Date: 20170721
MDC IDC SESS DTM: 20190829203540

## 2018-06-21 ENCOUNTER — Ambulatory Visit (INDEPENDENT_AMBULATORY_CARE_PROVIDER_SITE_OTHER): Payer: Medicare Other | Admitting: *Deleted

## 2018-06-21 DIAGNOSIS — I6340 Cerebral infarction due to embolism of unspecified cerebral artery: Secondary | ICD-10-CM | POA: Diagnosis not present

## 2018-06-22 ENCOUNTER — Ambulatory Visit (INDEPENDENT_AMBULATORY_CARE_PROVIDER_SITE_OTHER): Payer: Medicare Other | Admitting: Family Medicine

## 2018-06-22 VITALS — BP 104/65 | HR 84 | Temp 98.5°F | Ht 63.0 in | Wt 241.0 lb

## 2018-06-22 DIAGNOSIS — I639 Cerebral infarction, unspecified: Secondary | ICD-10-CM | POA: Diagnosis not present

## 2018-06-22 DIAGNOSIS — I10 Essential (primary) hypertension: Secondary | ICD-10-CM

## 2018-06-22 DIAGNOSIS — Z6841 Body Mass Index (BMI) 40.0 and over, adult: Secondary | ICD-10-CM

## 2018-06-22 DIAGNOSIS — E119 Type 2 diabetes mellitus without complications: Secondary | ICD-10-CM | POA: Diagnosis not present

## 2018-06-22 LAB — CUP PACEART REMOTE DEVICE CHECK
Implantable Pulse Generator Implant Date: 20170721
MDC IDC SESS DTM: 20191001213946

## 2018-06-22 NOTE — Progress Notes (Signed)
[facility] Summary Report / Loop Recorder

## 2018-06-22 NOTE — Progress Notes (Signed)
Office: [phone]  /  Fax: [phone]   HPI:   Chief Complaint: OBESITY [name] [name] is here to discuss her progress with her obesity treatment plan. She is on the Category 2 plan and is following her eating plan approximately 85 % of the time. She states she is exercising 0 minutes 0 times per week. [place] is working on getting in all of her protein. She is not always getting in all her snacks. She has occasional hunger when its between lunch and dinner to get through between meals.  Her weight is 241 lb (109.3 kg) today and has had a weight loss of 4 pounds over a period of 2 weeks since her last visit. She has lost 17 lbs since starting treatment with [place].  Hypertension [name] [name] is a 67 y.o. female with hypertension. [name] [name] denies dizziness and lightheadedness. She is working on weight loss to help control her blood pressure with the goal of decreasing her risk of heart attack and stroke. [name] [name]'s blood pressure has been low the past 3 visits.  Diabetes II [name] [name] has a diagnosis of diabetes type II. [name] [name] is not having carb cravings or indulgences. Her last A1c was 6.9 on 04/27/18. She has been working on intensive lifestyle modifications including diet, exercise, and weight loss to help control her blood glucose levels.  ALLERGIES: Allergies  Allergen Reactions  . Ambien [Zolpidem] Other (See Comments)    NIGHT TERRORS  . Atorvastatin Other (See Comments)    Muscle weakness Muscle Aches  . Codeine Nausea And Vomiting  . Penicillins Itching and Other (See Comments)    Made nose run uncontrollably Has patient had a PCN reaction causing immediate rash, facial/tongue/throat swelling, SOB or lightheadedness with hypotension: No Has patient had a PCN reaction causing severe rash involving mucus membranes or skin necrosis: No Has patient had a PCN reaction that required hospitalization No Has patient had a PCN reaction occurring within the last 10 years: No If all of the above  answers are "NO", then may proceed with Cephalosporin use.  . Simvastatin Other (See Comments)    Muscle weakness  . [place]etta 10 Mcg Pen [Exenatide] Nausea Only    MEDICATIONS: Current Outpatient Medications on File Prior to Visit  Medication Sig Dispense Refill  . b complex vitamins capsule Take 1 capsule by mouth daily. 30 capsule 2  . beta carotene w/minerals (OCUVITE) tablet Take 1 tablet by mouth daily.    . Biotin (BIOTIN 5000) 5 MG CAPS Take by mouth.    . Calcium-Vitamin D-Vitamin K (VIACTIV PO) Take by mouth.    . Cholecalciferol (VITAMIN D3 PO) Take by mouth.    . Coenzyme Q10 (CO Q-10) 400 MG CAPS Take by mouth.    . gabapentin (NEURONTIN) 300 MG capsule Take 2 capsules (600 mg total) by mouth at bedtime. 180 capsule 4  . losartan-hydrochlorothiazide (HYZAAR) 100-25 MG tablet Take 1 tablet by mouth daily. Take 1/2 tab daily    . magnesium oxide (MAG-OX) 400 MG tablet Take 400 mg by mouth at bedtime.     . metFORMIN (GLUCOPHAGE) 500 MG tablet Take 1 tablet (500 mg total) by mouth daily with breakfast. 90 tablet 0  . OVER THE COUNTER MEDICATION P5P vitamin    . rosuvastatin (CRESTOR) 20 MG tablet Take 1 tablet (20 mg total) by mouth daily. (Patient taking differently: Take 20 mg by mouth at bedtime. ) 30 tablet 0  . Vitamin D, Ergocalciferol, (DRISDOL) 50000 units CAPS capsule Take 1 capsule (50,000  Units total) by mouth every 7 (seven) days. 12 capsule 0  . XARELTO 20 MG TABS tablet Take 20 mg by mouth daily.     No current facility-administered medications on file prior to visit.     PAST MEDICAL HISTORY: Past Medical History:  Diagnosis Date  . Anemia   . Arthritis    "knees, hips, possibly back" (04/08/2016)  . Atrial septal aneurysm   . B12 deficiency    a. following gastric bypass.  . Back pain   . Chronic lower back pain    spondylosis  . Cold feet   . Daily headache    "short ones for the last month or 2" (04/08/2016)  . Depression   . Diabetes ([place])   . Dry  mouth   . DVT (deep venous thrombosis) ([place])   . Easy bruising   . Floaters in visual field   . GERD (gastroesophageal reflux disease)    takes Protonix daily "just to protect my stomach; not for reflux" (04/08/2016)  . Hay fever   . Heart murmur    "dx'd by 1 anesthesiologist years and years ago"  . History of loop recorder 03/2016  . Hyperlipidemia   . Hypertension   . Joint pain   . Leg cramp   . Macular degeneration    "just watching"  . Neuropathy   . Nosebleed   . Palpitations   . Peripheral edema   . Peripheral neuropathy    not on any meds  . 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  [name] Pneumonia 12/2017  . Sleep apnea    no CPAP since weight loss after bariatric surgery '12 (04/08/2016)  . Spondylosis   . Stroke ([place]) 04/07/2016   a. 01/2016 and 03/2016. during admission had + LE DVT.  [name] Swelling of extremity   . TIA (transient ischemic attack)   . Trouble in sleeping   . Type 2 diabetes, diet controlled ([place])   . Urinary incontinence   . Weakness     PAST SURGICAL HISTORY: Past Surgical History:  Procedure Laterality Date  . CATARACT EXTRACTION W/ INTRAOCULAR LENS  IMPLANT, BILATERAL Bilateral ~ 2012  . COLONOSCOPY    . EP IMPLANTABLE DEVICE N/A 04/10/2016   Procedure: Loop Recorder Insertion;  Surgeon: Will Meredith Leeds, MD;  Location: [facility];  Service: Cardiovascular;  Laterality: N/A;  . ESOPHAGOGASTRODUODENOSCOPY     in the 70's  . FACIAL COSMETIC SURGERY    . JOINT REPLACEMENT    . 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 SINUS ENDO W/FUSION  02/28/2016   Procedure: ENDOSCOPIC SINUS SURGERY WITH NAVIGATION;  Surgeon: Ruby Cola, MD;  Location: [facility];  Service: ENT;;  . TEE WITHOUT CARDIOVERSION N/A 04/10/2016   Procedure: TRANSESOPHAGEAL ECHOCARDIOGRAM (TEE) POSSIBLE LOOP;  Surgeon: Sanda Klein, MD;  Location: [facility];  Service: Cardiovascular;  Laterality: N/A;  . TONSILLECTOMY  1957  . TOTAL HIP ARTHROPLASTY Right  2008  . TOTAL HIP REVISION Right 03/30/2016   Procedure: RIGHT TOTAL HIP REVISION;  Surgeon: Frederik Pear, MD;  Location: [facility];  Service: Orthopedics;  Laterality: Right;  . TOTAL KNEE ARTHROPLASTY Bilateral 2001  . VAGINAL HYSTERECTOMY     "left my ovaries"    SOCIAL HISTORY: Social History   Tobacco Use  . Smoking status: Former Smoker    Packs/day: 1.00    Years: 40.00    Pack years: 40.00    Types: Cigarettes    Last attempt  to quit: 08/2006    Years since quitting: 11.8  . Smokeless tobacco: Never Used  Substance Use Topics  . Alcohol use: Yes    Alcohol/week: 0.0 standard drinks    Comment: 04/08/2016 "5-6 drinks/year'  . Drug use: No    Types: Marijuana    Comment: 04/08/2016 "did some marijuana in college"    FAMILY HISTORY: Family History  Problem Relation Age of Onset  . Neuropathy Mother   . Hypertension Mother   . Hyperlipidemia Mother   . Thyroid disease Mother   . Prostate cancer Father   . Lymphoma Father   . Hypertension Father   . Hyperlipidemia Father   . Cancer Father   . Obesity Father   . Congestive Heart Failure Maternal Grandmother   . Lung cancer Maternal Grandfather     ROS: Review of Systems  Constitutional: Positive for weight loss.  Neurological: Negative for dizziness.       Negative for lightheadedness.  Endo/Heme/Allergies:       Positive for carb cravings.    PHYSICAL EXAM: Blood pressure 104/65, pulse 84, temperature 98.5 F (36.9 C), temperature source Oral, height _0  (1.6 m), weight 241 lb (109.3 kg), SpO2 97 %. Body mass index is 42.69 kg/m. Physical Exam  Constitutional: She is oriented to person, place, and time. She appears well-developed and well-nourished.  Cardiovascular: Normal rate.  Pulmonary/Chest: Effort normal.  Musculoskeletal: Normal range of motion.  Neurological: She is oriented to person, place, and time.  Skin: Skin is warm and dry.  Psychiatric: She has a normal mood and affect. Her behavior is  normal.  Vitals reviewed.   RECENT LABS AND TESTS: BMET    Component Value Date/Time   NA 140 04/27/2018 0000   NA 139 04/29/2017 0944   K 3.8 04/27/2018 0000   K 3.9 04/29/2017 0944   CL 98 04/27/2018 0000   CO2 24 04/27/2018 0000   CO2 29 04/29/2017 0944   GLUCOSE 105 (H) 04/27/2018 0000   GLUCOSE 120 04/29/2017 0944   BUN 11 04/27/2018 0000   BUN 15.8 04/29/2017 0944   CREATININE 0.81 04/27/2018 0000   CREATININE 0.8 04/29/2017 0944   CALCIUM 9.3 04/27/2018 0000   CALCIUM 9.2 04/29/2017 0944   GFRNONAA 75 04/27/2018 0000   GFRAA 87 04/27/2018 0000   Lab Results  Component Value Date   HGBA1C 6.9 (H) 04/27/2018   HGBA1C 6.0 (H) 04/09/2016   HGBA1C 6.2 (H) 01/31/2016   HGBA1C (H) 03/15/2010    7.7 (NOTE)                                                                       According to the ADA Clinical Practice Recommendations for 2011, when HbA1c is used as a screening test:   >=6.5%   Diagnostic of Diabetes Mellitus           (if abnormal result  is confirmed)  5.7-6.4%   Increased risk of developing Diabetes Mellitus  References:Diagnosis and Classification of Diabetes Mellitus,Diabetes WUJW,1191,47(WGNFA 1):S62-S69 and Standards of Medical Care in         Diabetes - 2011,Diabetes Care,2011,34  (Suppl 1):S11-S61.   Lab Results  Component Value Date   INSULIN 7.0 04/27/2018   CBC  Component Value Date/Time   WBC 9.6 04/27/2018 0000   WBC 7.9 04/29/2017 0944   WBC 16.0 (H) 04/13/2016 1105   RBC 4.67 04/27/2018 0000   RBC 4.53 04/29/2017 0944   RBC 3.65 (L) 04/13/2016 1105   HGB 11.4 04/27/2018 0000   HGB 11.9 04/29/2017 0944   HCT 37.9 04/27/2018 0000   HCT 36.9 04/29/2017 0944   PLT 275 04/27/2018 0000   MCV 81 04/27/2018 0000   MCV 81.5 04/29/2017 0944   MCH 24.4 (L) 04/27/2018 0000   MCH 26.3 04/29/2017 0944   MCH 26.3 04/13/2016 1105   MCHC 30.1 (L) 04/27/2018 0000   MCHC 32.2 04/29/2017 0944   MCHC 31.5 04/13/2016 1105   RDW 16.1 (H) 04/27/2018  0000   RDW 14.2 04/29/2017 0944   LYMPHSABS 1.9 04/27/2018 0000   LYMPHSABS 1.7 04/29/2017 0944   MONOABS 0.6 04/29/2017 0944   EOSABS 0.2 04/27/2018 0000   BASOSABS 0.1 04/27/2018 0000   BASOSABS 0.0 04/29/2017 0944   Iron/TIBC/Ferritin/ %Sat    Component Value Date/Time   IRON 51 04/29/2017 0944   TIBC 343 04/29/2017 0944   FERRITIN 23 04/29/2017 0944   IRONPCTSAT 15 (L) 04/29/2017 0944   IRONPCTSAT 12 (L) 05/29/2011 1436   Lipid Panel     Component Value Date/Time   CHOL 152 04/27/2018 0000   TRIG 144 04/27/2018 0000   HDL 60 04/27/2018 0000   CHOLHDL 2.7 04/09/2016 0155   VLDL 27 04/09/2016 0155   LDLCALC 63 04/27/2018 0000   Hepatic Function Panel     Component Value Date/Time   PROT 6.3 04/27/2018 0000   PROT 6.8 04/29/2017 0944   ALBUMIN 4.1 04/27/2018 0000   ALBUMIN 3.4 (L) 04/29/2017 0944   AST 23 04/27/2018 0000   AST 17 04/29/2017 0944   ALT 12 04/27/2018 0000   ALT 14 04/29/2017 0944   ALKPHOS 108 04/27/2018 0000   ALKPHOS 116 04/29/2017 0944   BILITOT 0.3 04/27/2018 0000   BILITOT 0.41 04/29/2017 0944      Component Value Date/Time   TSH 2.000 04/27/2018 0000   TSH 2.070 02/01/2016 1418   TSH 3.275  03/14/2010 2104   Results for [name], [name] (MRN [id number]) as of 06/22/2018 12:15  Ref. Range 04/27/2018 00:00  Vitamin D, 25-Hydroxy Latest Ref Range: 30.0 - 100.0 ng/mL 31.3   ASSESSMENT AND PLAN: Essential hypertension  Type 2 diabetes mellitus without complication, without long-term current use of insulin (HCC)  Class 3 severe obesity with serious comorbidity and body mass index (BMI) of 40.0 to 44.9 in adult, unspecified obesity type ([place])  PLAN:  Hypertension We discussed sodium restriction, working on healthy weight loss, and a regular exercise program as the means to achieve improved blood pressure control. We will continue to monitor her blood pressure as well as her progress with the above lifestyle modifications. She will continue  her medications as prescribed and will watch for signs of hypotension as she continues her lifestyle modifications. [name] [name] agreed to decrease her Hyzaar to 1/2 tablet daily (she will cut the pill in half) and agreed to follow up as directed.  Diabetes II [name] [name] has been given extensive diabetes education by myself today including ideal fasting and post-prandial blood glucose readings, individual ideal Hgb A1c goals and hypoglycemia prevention. We discussed the importance of good blood sugar control to decrease the likelihood of diabetic complications such as nephropathy, neuropathy, limb loss, blindness, coronary artery disease, and death. We discussed the importance of intensive lifestyle  modification including diet, exercise and weight loss as the first line treatment for diabetes. [name] [name] agrees to continue her current diabetes medications and will follow up at the agreed upon time.  Obesity [name] [name] is currently in the action stage of change. As such, her goal is to continue with weight loss efforts. She has agreed to follow the Category 2 plan. [name] [name] has been instructed to work up to a goal of 150 minutes of combined cardio and strengthening exercise per week for weight loss and overall health benefits. We discussed the following Behavioral Modification Strategies today: increasing lean protein intake, increasing vegetables, work on meal planning and easy cooking plans, and planning for success.  [name] [name] has agreed to follow up with our clinic in 2 weeks. She was informed of the importance of frequent follow up visits to maximize her success with intensive lifestyle modifications for her multiple health conditions.   OBESITY BEHAVIORAL INTERVENTION VISIT  Today's visit was # 5   Starting weight: 258 lbs Starting date: 04/27/18 Today's weight : Weight: 241 lb (109.3 kg)  Today's date: 06/22/2018 Total lbs lost to date: 17 At least 15 minutes were spent on discussing the following behavioral  intervention visit.   ASK: We discussed the diagnosis of obesity with [name] [name] today and [name] [name] agreed to give [place] permission to discuss obesity behavioral modification therapy today.  ASSESS: [name] [name] has the diagnosis of obesity and her BMI today is 42.7. [name] [name] is in the action stage of change.   ADVISE: [name] [name] was educated on the multiple health risks of obesity as well as the benefit of weight loss to improve her health. She was advised of the need for long term treatment and the importance of lifestyle modifications to improve her current health and to decrease her risk of future health problems.  AGREE: Multiple dietary modification options and treatment options were discussed and [name] [name] agreed to follow the recommendations documented in the above note.  ARRANGE: [name] [name] was educated on the importance of frequent visits to treat obesity as outlined per CMS and USPSTF guidelines and agreed to schedule her next follow up appointment today.  I, Marcille Blanco, am acting as Location manager for Eber Jones, MD  I have reviewed the above documentation for accuracy and completeness, and I agree with the above. - Ilene Qua, MD

## 2018-06-27 DIAGNOSIS — I10 Essential (primary) hypertension: Secondary | ICD-10-CM | POA: Diagnosis not present

## 2018-06-27 DIAGNOSIS — E7800 Pure hypercholesterolemia, unspecified: Secondary | ICD-10-CM | POA: Diagnosis not present

## 2018-06-27 DIAGNOSIS — Z8673 Personal history of transient ischemic attack (TIA), and cerebral infarction without residual deficits: Secondary | ICD-10-CM | POA: Diagnosis not present

## 2018-06-27 DIAGNOSIS — Z7984 Long term (current) use of oral hypoglycemic drugs: Secondary | ICD-10-CM | POA: Diagnosis not present

## 2018-06-27 DIAGNOSIS — E119 Type 2 diabetes mellitus without complications: Secondary | ICD-10-CM | POA: Diagnosis not present

## 2018-06-28 DIAGNOSIS — Z23 Encounter for immunization: Secondary | ICD-10-CM | POA: Diagnosis not present

## 2018-07-06 ENCOUNTER — Ambulatory Visit (INDEPENDENT_AMBULATORY_CARE_PROVIDER_SITE_OTHER): Payer: Medicare Other | Admitting: Family Medicine

## 2018-07-06 VITALS — BP 101/72 | HR 75 | Temp 98.1°F | Ht 63.0 in | Wt 238.0 lb

## 2018-07-06 DIAGNOSIS — I10 Essential (primary) hypertension: Secondary | ICD-10-CM

## 2018-07-06 DIAGNOSIS — Z6841 Body Mass Index (BMI) 40.0 and over, adult: Secondary | ICD-10-CM

## 2018-07-06 DIAGNOSIS — E559 Vitamin D deficiency, unspecified: Secondary | ICD-10-CM | POA: Diagnosis not present

## 2018-07-06 DIAGNOSIS — I639 Cerebral infarction, unspecified: Secondary | ICD-10-CM | POA: Diagnosis not present

## 2018-07-07 NOTE — Progress Notes (Signed)
Office: [phone]  /  Fax: [phone]   HPI:   Chief Complaint: OBESITY [name] [name] is here to discuss her progress with her obesity treatment plan. She is on the Category 2 plan and is following her eating plan approximately 90 % of the time. She states she is riding the bike, doing yoga, and silver sneakers for 30-45 minutes 4 times per week. [name] [name] still doesn't like eating all the meat. She is eating yogurt, 100 calorie snack bag, or peanut butter toast. She is working on getting 6 oz of meat at dinner. Her hunger is controlled.  Her weight is 238 lb (108 kg) today and has had a weight loss of 3 pounds over a period of 2 weeks since her last visit. She has lost 20 lbs since starting treatment with [place].  Hypertension [name] [name] is a 67 y.o. female with hypertension. [name] [name]'s blood pressure is controlled. She denies chest pain, chest pressure, or headaches. She is working weight loss to help control her blood pressure with the goal of decreasing her risk of heart attack and stroke.   Vitamin D deficiency [name] [name] has a diagnosis of vitamin D deficiency. She is currently taking prescription Vit D She notes fatigue and denies nausea, vomiting or muscle weakness.  ALLERGIES: Allergies  Allergen Reactions  . Ambien [Zolpidem] Other (See Comments)    NIGHT TERRORS  . Atorvastatin Other (See Comments)    Muscle weakness Muscle Aches  . Codeine Nausea And Vomiting  . Penicillins Itching and Other (See Comments)    Made nose run uncontrollably Has patient had a PCN reaction causing immediate rash, facial/tongue/throat swelling, SOB or lightheadedness with hypotension: No Has patient had a PCN reaction causing severe rash involving mucus membranes or skin necrosis: No Has patient had a PCN reaction that required hospitalization No Has patient had a PCN reaction occurring within the last 10 years: No If all of the above answers are "NO", then may proceed with Cephalosporin use.  .  Simvastatin Other (See Comments)    Muscle weakness  . Byetta 10 Mcg Pen [Exenatide] Nausea Only    MEDICATIONS: Current Outpatient Medications on File Prior to Visit  Medication Sig Dispense Refill  . b complex vitamins capsule Take 1 capsule by mouth daily. 30 capsule 2  . beta carotene w/minerals (OCUVITE) tablet Take 1 tablet by mouth daily.    . Biotin (BIOTIN 5000) 5 MG CAPS Take by mouth.    . Calcium-Vitamin D-Vitamin K (VIACTIV PO) Take by mouth.    . Cholecalciferol (VITAMIN D3 PO) Take by mouth.    . Coenzyme Q10 (CO Q-10) 400 MG CAPS Take by mouth.    . gabapentin (NEURONTIN) 300 MG capsule Take 2 capsules (600 mg total) by mouth at bedtime. 180 capsule 4  . losartan-hydrochlorothiazide (HYZAAR) 100-25 MG tablet Take 1 tablet by mouth daily. Take 1/2 tab daily    . magnesium oxide (MAG-OX) 400 MG tablet Take 400 mg by mouth at bedtime.     . metFORMIN (GLUCOPHAGE) 500 MG tablet Take 1 tablet (500 mg total) by mouth daily with breakfast. 90 tablet 0  . OVER THE COUNTER MEDICATION P5P vitamin    . rosuvastatin (CRESTOR) 20 MG tablet Take 1 tablet (20 mg total) by mouth daily. (Patient taking differently: Take 20 mg by mouth at bedtime. ) 30 tablet 0  . Vitamin D, Ergocalciferol, (DRISDOL) 50000 units CAPS capsule Take 1 capsule (50,000 Units total) by mouth every 7 (seven) days. 12 capsule 0  .  XARELTO 20 MG TABS tablet Take 20 mg by mouth daily.     No current facility-administered medications on file prior to visit.     PAST MEDICAL HISTORY: Past Medical History:  Diagnosis Date  . Anemia   . Arthritis    "knees, hips, possibly back" (04/08/2016)  . Atrial septal aneurysm   . B12 deficiency    a. following gastric bypass.  . Back pain   . Chronic lower back pain    spondylosis  . Cold feet   . Daily headache    "short ones for the last month or 2" (04/08/2016)  . Depression   . Diabetes ([place])   . Dry mouth   . DVT (deep venous thrombosis) ([place])   . Easy bruising    . Floaters in visual field   . GERD (gastroesophageal reflux disease)    takes Protonix daily "just to protect my stomach; not for reflux" (04/08/2016)  . Hay fever   . Heart murmur    "dx'd by 1 anesthesiologist years and years ago"  . History of loop recorder 03/2016  . Hyperlipidemia   . Hypertension   . Joint pain   . Leg cramp   . Macular degeneration    "just watching"  . Neuropathy   . Nosebleed   . Palpitations   . Peripheral edema   . Peripheral neuropathy    not on any meds  . 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  [name] Pneumonia 12/2017  . Sleep apnea    no CPAP since weight loss after bariatric surgery '12 (04/08/2016)  . Spondylosis   . Stroke ([place]) 04/07/2016   a. 01/2016 and 03/2016. during admission had + LE DVT.  [name] Swelling of extremity   . TIA (transient ischemic attack)   . Trouble in sleeping   . Type 2 diabetes, diet controlled ([place])   . Urinary incontinence   . Weakness     PAST SURGICAL HISTORY: Past Surgical History:  Procedure Laterality Date  . CATARACT EXTRACTION W/ INTRAOCULAR LENS  IMPLANT, BILATERAL Bilateral ~ 2012  . COLONOSCOPY    . EP IMPLANTABLE DEVICE N/A 04/10/2016   Procedure: Loop Recorder Insertion;  Surgeon: Will Meredith Leeds, MD;  Location: [facility];  Service: Cardiovascular;  Laterality: N/A;  . ESOPHAGOGASTRODUODENOSCOPY     in the 70's  . FACIAL COSMETIC SURGERY    . JOINT REPLACEMENT    . 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 SINUS ENDO W/FUSION  02/28/2016   Procedure: ENDOSCOPIC SINUS SURGERY WITH NAVIGATION;  Surgeon: Ruby Cola, MD;  Location: [facility];  Service: ENT;;  . TEE WITHOUT CARDIOVERSION N/A 04/10/2016   Procedure: TRANSESOPHAGEAL ECHOCARDIOGRAM (TEE) POSSIBLE LOOP;  Surgeon: Sanda Klein, MD;  Location: [facility];  Service: Cardiovascular;  Laterality: N/A;  . TONSILLECTOMY  1957  . TOTAL HIP ARTHROPLASTY Right 2008  . TOTAL HIP REVISION Right 03/30/2016   Procedure: RIGHT  TOTAL HIP REVISION;  Surgeon: Frederik Pear, MD;  Location: [facility];  Service: Orthopedics;  Laterality: Right;  . TOTAL KNEE ARTHROPLASTY Bilateral 2001  . VAGINAL HYSTERECTOMY     "left my ovaries"    SOCIAL HISTORY: Social History   Tobacco Use  . Smoking status: Former Smoker    Packs/day: 1.00    Years: 40.00    Pack years: 40.00    Types: Cigarettes    Last attempt to quit: 08/2006    Years since quitting: 11.8  . Smokeless  tobacco: Never Used  Substance Use Topics  . Alcohol use: Yes    Alcohol/week: 0.0 standard drinks    Comment: 04/08/2016 "5-6 drinks/year'  . Drug use: No    Types: Marijuana    Comment: 04/08/2016 "did some marijuana in college"    FAMILY HISTORY: Family History  Problem Relation Age of Onset  . Neuropathy Mother   . Hypertension Mother   . Hyperlipidemia Mother   . Thyroid disease Mother   . Prostate cancer Father   . Lymphoma Father   . Hypertension Father   . Hyperlipidemia Father   . Cancer Father   . Obesity Father   . Congestive Heart Failure Maternal Grandmother   . Lung cancer Maternal Grandfather     ROS: Review of Systems  Constitutional: Positive for malaise/fatigue and weight loss.  Cardiovascular: Negative for chest pain.       Negative chest pressure  Gastrointestinal: Negative for nausea and vomiting.  Musculoskeletal:       Negative muscle weakness  Neurological: Negative for headaches.    PHYSICAL EXAM: Blood pressure 101/72, pulse 75, temperature 98.1 F (36.7 C), temperature source Oral, height 5' 3" (1.6 m), weight 238 lb (108 kg), SpO2 100 %. Body mass index is 42.16 kg/m. Physical Exam  Constitutional: She is oriented to person, place, and time. She appears well-developed and well-nourished.  Cardiovascular: Normal rate.  Pulmonary/Chest: Effort normal.  Musculoskeletal: Normal range of motion.  Neurological: She is oriented to person, place, and time.  Skin: Skin is warm and dry.  Psychiatric: She has a  normal mood and affect. Her behavior is normal.  Vitals reviewed.   RECENT LABS AND TESTS: BMET    Component Value Date/Time   NA 140 04/27/2018 0000   NA 139 04/29/2017 0944   K 3.8 04/27/2018 0000   K 3.9 04/29/2017 0944   CL 98 04/27/2018 0000   CO2 24 04/27/2018 0000   CO2 29 04/29/2017 0944   GLUCOSE 105 (H) 04/27/2018 0000   GLUCOSE 120 04/29/2017 0944   BUN 11 04/27/2018 0000   BUN 15.8 04/29/2017 0944   CREATININE 0.81 04/27/2018 0000   CREATININE 0.8 04/29/2017 0944   CALCIUM 9.3 04/27/2018 0000   CALCIUM 9.2 04/29/2017 0944   GFRNONAA 75 04/27/2018 0000   GFRAA 87 04/27/2018 0000   Lab Results  Component Value Date   HGBA1C 6.9 (H) 04/27/2018   HGBA1C 6.0 (H) 04/09/2016   HGBA1C 6.2 (H) 01/31/2016   HGBA1C (H) 03/15/2010    7.7 (NOTE)                                                                       According to the ADA Clinical Practice Recommendations for 2011, when HbA1c is used as a screening test:   >=6.5%   Diagnostic of Diabetes Mellitus           (if abnormal result  is confirmed)  5.7-6.4%   Increased risk of developing Diabetes Mellitus  References:Diagnosis and Classification of Diabetes Mellitus,Diabetes HQIO,9629,52(WUXLK 1):S62-S69 and Standards of Medical Care in         Diabetes - 2011,Diabetes Care,2011,34  (Suppl 1):S11-S61.   Lab Results  Component Value Date   INSULIN 7.0 04/27/2018  CBC    Component Value Date/Time   WBC 9.6 04/27/2018 0000   WBC 7.9 04/29/2017 0944   WBC 16.0 (H) 04/13/2016 1105   RBC 4.67 04/27/2018 0000   RBC 4.53 04/29/2017 0944   RBC 3.65 (L) 04/13/2016 1105   HGB 11.4 04/27/2018 0000   HGB 11.9 04/29/2017 0944   HCT 37.9 04/27/2018 0000   HCT 36.9 04/29/2017 0944   PLT 275 04/27/2018 0000   MCV 81 04/27/2018 0000   MCV 81.5 04/29/2017 0944   MCH 24.4 (L) 04/27/2018 0000   MCH 26.3 04/29/2017 0944   MCH 26.3 04/13/2016 1105   MCHC 30.1 (L) 04/27/2018 0000   MCHC 32.2 04/29/2017 0944   MCHC 31.5  04/13/2016 1105   RDW 16.1 (H) 04/27/2018 0000   RDW 14.2 04/29/2017 0944   LYMPHSABS 1.9 04/27/2018 0000   LYMPHSABS 1.7 04/29/2017 0944   MONOABS 0.6 04/29/2017 0944   EOSABS 0.2 04/27/2018 0000   BASOSABS 0.1 04/27/2018 0000   BASOSABS 0.0 04/29/2017 0944   Iron/TIBC/Ferritin/ %Sat    Component Value Date/Time   IRON 51 04/29/2017 0944   TIBC 343 04/29/2017 0944   FERRITIN 23 04/29/2017 0944   IRONPCTSAT 15 (L) 04/29/2017 0944   IRONPCTSAT 12 (L) 05/29/2011 1436   Lipid Panel     Component Value Date/Time   CHOL 152 04/27/2018 0000   TRIG 144 04/27/2018 0000   HDL 60 04/27/2018 0000   CHOLHDL 2.7 04/09/2016 0155   VLDL 27 04/09/2016 0155   LDLCALC 63 04/27/2018 0000   Hepatic Function Panel     Component Value Date/Time   PROT 6.3 04/27/2018 0000   PROT 6.8 04/29/2017 0944   ALBUMIN 4.1 04/27/2018 0000   ALBUMIN 3.4 (L) 04/29/2017 0944   AST 23 04/27/2018 0000   AST 17 04/29/2017 0944   ALT 12 04/27/2018 0000   ALT 14 04/29/2017 0944   ALKPHOS 108 04/27/2018 0000   ALKPHOS 116 04/29/2017 0944   BILITOT 0.3 04/27/2018 0000   BILITOT 0.41 04/29/2017 0944      Component Value Date/Time   TSH 2.000 04/27/2018 0000   TSH 2.070 02/01/2016 1418   TSH 3.275  03/14/2010 2104   Results for MAIRE, GOVAN (MRN [id number]) as of 07/07/2018 11:38  Ref. Range 04/27/2018 00:00  Vitamin D, 25-Hydroxy Latest Ref Range: 30.0 - 100.0 ng/mL 31.3   ASSESSMENT AND PLAN: Essential hypertension  Vitamin D deficiency  Class 3 severe obesity with serious comorbidity and body mass index (BMI) of 40.0 to 44.9 in adult, unspecified obesity type (HCC)  PLAN:  Hypertension We discussed sodium restriction, working on healthy weight loss, and a regular exercise program as the means to achieve improved blood pressure control. [name] [name] agreed with this plan and agreed to follow up as directed. We will continue to monitor her blood pressure as well as her progress with the above  lifestyle modifications. [name] [name] agrees to continue her current medications and will watch for signs of hypotension as she continues her lifestyle modifications. [name] [name] agrees to follow up with our clinic in 2 weeks.  Vitamin D Deficiency [name] [name] was informed that low vitamin D levels contributes to fatigue and are associated with obesity, breast, and colon cancer. [name] [name] agrees to continue taking prescription Vit D _0 ,000 IU every week and will follow up for routine testing of vitamin D, at least 2-3 times per year. She was informed of the risk of over-replacement of vitamin D and agrees to not increase her dose unless  she discusses this with [place] first. [name] [name] agrees to follow up with our clinic in 2 weeks.  I spent > than 50% of the 15 minute visit on counseling as documented in the note.  Obesity [name] [name] is currently in the action stage of change. As such, her goal is to continue with weight loss efforts She has agreed to follow the Category 2 plan [name] [name] has been instructed to work up to a goal of 150 minutes of combined cardio and strengthening exercise per week for weight loss and overall health benefits. We discussed the following Behavioral Modification Strategies today: increasing lean protein intake, increasing vegetables, work on meal planning and easy cooking plans, and planning for success   Celia has agreed to follow up with our clinic in 2 weeks. She was informed of the importance of frequent follow up visits to maximize her success with intensive lifestyle modifications for her multiple health conditions.   OBESITY BEHAVIORAL INTERVENTION VISIT  Today's visit was # 6   Starting weight: 258 lbs Starting date: 04/27/18 Today's weight : 238 lbs  Today's date: 07/06/2018 Total lbs lost to date: 36    ASK: We discussed the diagnosis of obesity with [name] [name] today and [name] [name] agreed to give [place] permission to discuss obesity behavioral modification therapy  today.  ASSESS: Larrissa has the diagnosis of obesity and her BMI today is 42.17 Juley is in the action stage of change   ADVISE: Aeon was educated on the multiple health risks of obesity as well as the benefit of weight loss to improve her health. She was advised of the need for long term treatment and the importance of lifestyle modifications to improve her current health and to decrease her risk of future health problems.  AGREE: Multiple dietary modification options and treatment options were discussed and  Yannet agreed to follow the recommendations documented in the above note.  ARRANGE: Kourtnie was educated on the importance of frequent visits to treat obesity as outlined per CMS and USPSTF guidelines and agreed to schedule her next follow up appointment today.  I, Trixie Dredge, am acting as transcriptionist for Ilene Qua, MD  I have reviewed the above documentation for accuracy and completeness, and I agree with the above. - Ilene Qua, MD

## 2018-07-20 ENCOUNTER — Ambulatory Visit (INDEPENDENT_AMBULATORY_CARE_PROVIDER_SITE_OTHER): Payer: Medicare Other | Admitting: Family Medicine

## 2018-07-20 ENCOUNTER — Encounter (INDEPENDENT_AMBULATORY_CARE_PROVIDER_SITE_OTHER): Payer: Self-pay | Admitting: Family Medicine

## 2018-07-20 VITALS — BP 103/70 | HR 76 | Temp 98.1°F | Ht 63.0 in | Wt 237.0 lb

## 2018-07-20 DIAGNOSIS — Z6841 Body Mass Index (BMI) 40.0 and over, adult: Secondary | ICD-10-CM

## 2018-07-20 DIAGNOSIS — E785 Hyperlipidemia, unspecified: Secondary | ICD-10-CM

## 2018-07-20 DIAGNOSIS — E559 Vitamin D deficiency, unspecified: Secondary | ICD-10-CM

## 2018-07-20 DIAGNOSIS — E1169 Type 2 diabetes mellitus with other specified complication: Secondary | ICD-10-CM

## 2018-07-20 NOTE — Progress Notes (Signed)
Office: [phone]  /  Fax: [phone]   HPI:   Chief Complaint: OBESITY Geselle is here to discuss her progress with her obesity treatment plan. She is following the Category 2 plan and is following her eating plan approximately 90 % of the time. She states she is going to the gym and doing cardio for 30 minutes 4 times per week. Huntley is very frustrated by her minimal weight loss. She is sticking to the plan very well. Novalee is getting her protein in on most days. She denies any polyphagia.  Her weight is 237 lb (107.5 kg) today and has had a weight loss of 1 pound over a period of 2 weeks since her last visit. She has lost 21 lbs since starting treatment with [place].  Vitamin D deficiency Jamisha has a diagnosis of vitamin D deficiency. She is currently taking vit D and denies nausea, vomiting or muscle weakness. She currently has osteopenia.  Hyperlipidemia Jaelene has hyperlipidemia and has been trying to improve her cholesterol levels with intensive lifestyle modification including a low saturated fat diet, exercise and weight loss. Tevis is on a statin and tolerating it well. LDL is stable and at goal.  She denies any chest pain, claudication or myalgias.    ALLERGIES: Allergies  Allergen Reactions  . Ambien [Zolpidem] Other (See Comments)    NIGHT TERRORS  . Atorvastatin Other (See Comments)    Muscle weakness Muscle Aches  . Codeine Nausea And Vomiting  . Penicillins Itching and Other (See Comments)    Made nose run uncontrollably Has patient had a PCN reaction causing immediate rash, facial/tongue/throat swelling, SOB or lightheadedness with hypotension: No Has patient had a PCN reaction causing severe rash involving mucus membranes or skin necrosis: No Has patient had a PCN reaction that required hospitalization No Has patient had a PCN reaction occurring within the last 10 years: No If all of the above answers are "NO", then may proceed with Cephalosporin use.  .  Simvastatin Other (See Comments)    Muscle weakness  . Byetta 10 Mcg Pen [Exenatide] Nausea Only    MEDICATIONS: Current Outpatient Medications on File Prior to Visit  Medication Sig Dispense Refill  . b complex vitamins capsule Take 1 capsule by mouth daily. 30 capsule 2  . beta carotene w/minerals (OCUVITE) tablet Take 1 tablet by mouth daily.    . Biotin (BIOTIN 5000) 5 MG CAPS Take by mouth.    . Calcium-Vitamin D-Vitamin K (VIACTIV PO) Take by mouth.    . Cholecalciferol (VITAMIN D3 PO) Take by mouth.    . Coenzyme Q10 (CO Q-10) 400 MG CAPS Take by mouth.    . gabapentin (NEURONTIN) 300 MG capsule Take 2 capsules (600 mg total) by mouth at bedtime. 180 capsule 4  . losartan-hydrochlorothiazide (HYZAAR) 100-25 MG tablet Take 1 tablet by mouth daily. Take 1/2 tab daily    . magnesium oxide (MAG-OX) 400 MG tablet Take 400 mg by mouth at bedtime.     . metFORMIN (GLUCOPHAGE) 500 MG tablet Take 1 tablet (500 mg total) by mouth daily with breakfast. 90 tablet 0  . OVER THE COUNTER MEDICATION P5P vitamin    . rosuvastatin (CRESTOR) 20 MG tablet Take 1 tablet (20 mg total) by mouth daily. (Patient taking differently: Take 20 mg by mouth at bedtime. ) 30 tablet 0  . Vitamin D, Ergocalciferol, (DRISDOL) 50000 units CAPS capsule Take 1 capsule (50,000 Units total) by mouth every 7 (seven) days. 12 capsule 0  .  XARELTO 20 MG TABS tablet Take 20 mg by mouth daily.     No current facility-administered medications on file prior to visit.     PAST MEDICAL HISTORY: Past Medical History:  Diagnosis Date  . Anemia   . Arthritis    "knees, hips, possibly back" (04/08/2016)  . Atrial septal aneurysm   . B12 deficiency    a. following gastric bypass.  . Back pain   . Chronic lower back pain    spondylosis  . Cold feet   . Daily headache    "short ones for the last month or 2" (04/08/2016)  . Depression   . Diabetes ([place])   . Dry mouth   . DVT (deep venous thrombosis) ([place])   . Easy bruising    . Floaters in visual field   . GERD (gastroesophageal reflux disease)    takes Protonix daily "just to protect my stomach; not for reflux" (04/08/2016)  . Hay fever   . Heart murmur    "dx'd by 1 anesthesiologist years and years ago"  . History of loop recorder 03/2016  . Hyperlipidemia   . Hypertension   . Joint pain   . Leg cramp   . Macular degeneration    "just watching"  . Neuropathy   . Nosebleed   . Palpitations   . Peripheral edema   . Peripheral neuropathy    not on any meds  . 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  [name] Pneumonia 12/2017  . Sleep apnea    no CPAP since weight loss after bariatric surgery '12 (04/08/2016)  . Spondylosis   . Stroke ([place]) 04/07/2016   a. 01/2016 and 03/2016. during admission had + LE DVT.  [name] Swelling of extremity   . TIA (transient ischemic attack)   . Trouble in sleeping   . Type 2 diabetes, diet controlled ([place])   . Urinary incontinence   . Weakness     PAST SURGICAL HISTORY: Past Surgical History:  Procedure Laterality Date  . CATARACT EXTRACTION W/ INTRAOCULAR LENS  IMPLANT, BILATERAL Bilateral ~ 2012  . COLONOSCOPY    . EP IMPLANTABLE DEVICE N/A 04/10/2016   Procedure: Loop Recorder Insertion;  Surgeon: Will Meredith Leeds, MD;  Location: [facility];  Service: Cardiovascular;  Laterality: N/A;  . ESOPHAGOGASTRODUODENOSCOPY     in the 70's  . FACIAL COSMETIC SURGERY    . JOINT REPLACEMENT    . 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 SINUS ENDO W/FUSION  02/28/2016   Procedure: ENDOSCOPIC SINUS SURGERY WITH NAVIGATION;  Surgeon: Ruby Cola, MD;  Location: [facility];  Service: ENT;;  . TEE WITHOUT CARDIOVERSION N/A 04/10/2016   Procedure: TRANSESOPHAGEAL ECHOCARDIOGRAM (TEE) POSSIBLE LOOP;  Surgeon: Sanda Klein, MD;  Location: [facility];  Service: Cardiovascular;  Laterality: N/A;  . TONSILLECTOMY  1957  . TOTAL HIP ARTHROPLASTY Right 2008  . TOTAL HIP REVISION Right 03/30/2016   Procedure: RIGHT  TOTAL HIP REVISION;  Surgeon: Frederik Pear, MD;  Location: [facility];  Service: Orthopedics;  Laterality: Right;  . TOTAL KNEE ARTHROPLASTY Bilateral 2001  . VAGINAL HYSTERECTOMY     "left my ovaries"    SOCIAL HISTORY: Social History   Tobacco Use  . Smoking status: Former Smoker    Packs/day: 1.00    Years: 40.00    Pack years: 40.00    Types: Cigarettes    Last attempt to quit: 08/2006    Years since quitting: 11.9  . Smokeless  tobacco: Never Used  Substance Use Topics  . Alcohol use: Yes    Alcohol/week: 0.0 standard drinks    Comment: 04/08/2016 "5-6 drinks/year'  . Drug use: No    Types: Marijuana    Comment: 04/08/2016 "did some marijuana in college"    FAMILY HISTORY: Family History  Problem Relation Age of Onset  . Neuropathy Mother   . Hypertension Mother   . Hyperlipidemia Mother   . Thyroid disease Mother   . Prostate cancer Father   . Lymphoma Father   . Hypertension Father   . Hyperlipidemia Father   . Cancer Father   . Obesity Father   . Congestive Heart Failure Maternal Grandmother   . Lung cancer Maternal Grandfather     ROS: Review of Systems  Constitutional: Positive for weight loss.  Cardiovascular: Negative for chest pain and claudication.  Gastrointestinal: Negative for nausea and vomiting.  Musculoskeletal: Negative for myalgias.       Negative muscle weakness    PHYSICAL EXAM: Blood pressure 103/70, pulse 76, temperature 98.1 F (36.7 C), temperature source Oral, height 5' 3" (1.6 m), weight 237 lb (107.5 kg), SpO2 94 %. Body mass index is 41.98 kg/m. Physical Exam  Constitutional: She is oriented to person, place, and time. She appears well-developed and well-nourished.  Cardiovascular: Normal rate.  Pulmonary/Chest: Effort normal.  Musculoskeletal: Normal range of motion.  Neurological: She is alert and oriented to person, place, and time.  Skin: Skin is warm and dry.  Psychiatric: She has a normal mood and affect. Her behavior is  normal.  Vitals reviewed.   RECENT LABS AND TESTS: BMET    Component Value Date/Time   NA 140 04/27/2018 0000   NA 139 04/29/2017 0944   K 3.8 04/27/2018 0000   K 3.9 04/29/2017 0944   CL 98 04/27/2018 0000   CO2 24 04/27/2018 0000   CO2 29 04/29/2017 0944   GLUCOSE 105 (H) 04/27/2018 0000   GLUCOSE 120 04/29/2017 0944   BUN 11 04/27/2018 0000   BUN 15.8 04/29/2017 0944   CREATININE 0.81 04/27/2018 0000   CREATININE 0.8 04/29/2017 0944   CALCIUM 9.3 04/27/2018 0000   CALCIUM 9.2 04/29/2017 0944   GFRNONAA 75 04/27/2018 0000   GFRAA 87 04/27/2018 0000   Lab Results  Component Value Date   HGBA1C 6.9 (H) 04/27/2018   HGBA1C 6.0 (H) 04/09/2016   HGBA1C 6.2 (H) 01/31/2016   HGBA1C (H) 03/15/2010    7.7 (NOTE)                                                                       According to the ADA Clinical Practice Recommendations for 2011, when HbA1c is used as a screening test:   >=6.5%   Diagnostic of Diabetes Mellitus           (if abnormal result  is confirmed)  5.7-6.4%   Increased risk of developing Diabetes Mellitus  References:Diagnosis and Classification of Diabetes Mellitus,Diabetes ZOXW,9604,54(UJWJX 1):S62-S69 and Standards of Medical Care in         Diabetes - 2011,Diabetes Care,2011,34  (Suppl 1):S11-S61.   Lab Results  Component Value Date   INSULIN 7.0 04/27/2018   CBC    Component Value Date/Time  WBC 9.6 04/27/2018 0000   WBC 7.9 04/29/2017 0944   WBC 16.0 (H) 04/13/2016 1105   RBC 4.67 04/27/2018 0000   RBC 4.53 04/29/2017 0944   RBC 3.65 (L) 04/13/2016 1105   HGB 11.4 04/27/2018 0000   HGB 11.9 04/29/2017 0944   HCT 37.9 04/27/2018 0000   HCT 36.9 04/29/2017 0944   PLT 275 04/27/2018 0000   MCV 81 04/27/2018 0000   MCV 81.5 04/29/2017 0944   MCH 24.4 (L) 04/27/2018 0000   MCH 26.3 04/29/2017 0944   MCH 26.3 04/13/2016 1105   MCHC 30.1 (L) 04/27/2018 0000   MCHC 32.2 04/29/2017 0944   MCHC 31.5 04/13/2016 1105   RDW 16.1 (H) 04/27/2018  0000   RDW 14.2 04/29/2017 0944   LYMPHSABS 1.9 04/27/2018 0000   LYMPHSABS 1.7 04/29/2017 0944   MONOABS 0.6 04/29/2017 0944   EOSABS 0.2 04/27/2018 0000   BASOSABS 0.1 04/27/2018 0000   BASOSABS 0.0 04/29/2017 0944   Iron/TIBC/Ferritin/ %Sat    Component Value Date/Time   IRON 51 04/29/2017 0944   TIBC 343 04/29/2017 0944   FERRITIN 23 04/29/2017 0944   IRONPCTSAT 15 (L) 04/29/2017 0944   IRONPCTSAT 12 (L) 05/29/2011 1436   Lipid Panel     Component Value Date/Time   CHOL 152 04/27/2018 0000   TRIG 144 04/27/2018 0000   HDL 60 04/27/2018 0000   CHOLHDL 2.7 04/09/2016 0155   VLDL 27 04/09/2016 0155   LDLCALC 63 04/27/2018 0000   Hepatic Function Panel     Component Value Date/Time   PROT 6.3 04/27/2018 0000   PROT 6.8 04/29/2017 0944   ALBUMIN 4.1 04/27/2018 0000   ALBUMIN 3.4 (L) 04/29/2017 0944   AST 23 04/27/2018 0000   AST 17 04/29/2017 0944   ALT 12 04/27/2018 0000   ALT 14 04/29/2017 0944   ALKPHOS 108 04/27/2018 0000   ALKPHOS 116 04/29/2017 0944   BILITOT 0.3 04/27/2018 0000   BILITOT 0.41 04/29/2017 0944   Results for RAELYNNE, LUDWICK (MRN [id number]) as of 07/20/2018 16:29  Ref. Range 04/27/2018 00:00  Vitamin D, 25-Hydroxy Latest Ref Range: 30.0 - 100.0 ng/mL 31.3    ASSESSMENT AND PLAN: Vitamin D deficiency  Hyperlipidemia associated with type 2 diabetes mellitus (HCC)  Class 3 severe obesity with serious comorbidity and body mass index (BMI) of 40.0 to 44.9 in adult, unspecified obesity type ([place])  PLAN: Vitamin D Deficiency Aylana was informed that low vitamin D levels contributes to fatigue and are associated with obesity, breast, and colon cancer. She agrees to continue to take prescription Vit D _0 ,000 IU every week and will follow up for routine testing of vitamin D, at least 2-3 times per year. She was informed of the risk of over-replacement of vitamin D and agrees to not increase her dose unless she discusses this with [place] first. Faren  agrees to follow up in our office in 2 weeks.   Hyperlipidemia Hyperlipidemia is stable and at goal on Crestor.  Fritzi was informed of the American Heart Association Guidelines emphasizing intensive lifestyle modifications as the first line treatment for hyperlipidemia. We discussed many lifestyle modifications today in depth, and Shavanna will continue to work on decreasing saturated fats such as fatty red meat, butter and many fried foods. Shulamit will continue her statin per her PCP. She will also increase vegetables and lean protein in her diet and continue to work on exercise and weight loss efforts. Camara agrees to follow up in our office in  2 weeks.   Obesity Anju is currently in the action stage of change. As such, her goal is to continue with weight loss efforts She has agreed to follow the Category 2 plan and follow the Prepacked eating plan Karlyn has been instructed to work up to a goal of 150 minutes of combined cardio and strengthening exercise per week for weight loss and overall health benefits. She will add more resistance exercises We discussed the following Behavioral Modification Stratagies today: increasing lean protein intake and planning for success  Kaniya has agreed to follow up with our clinic in 2 weeks. She was informed of the importance of frequent follow up visits to maximize her success with intensive lifestyle modifications for her multiple health conditions.   OBESITY BEHAVIORAL INTERVENTION VISIT  Today's visit was # 7   Starting weight: 258 lbs Starting date: 04/27/2018 Today's weight : Weight: 237 lb (107.5 kg)  Today's date: 07/21/2018 Total lbs lost to date: 21 lbs At least 15 minutes were spent on discussing the following behavioral intervention visit.   ASK: We discussed the diagnosis of obesity with Jefm Bryant today and Ameila agreed to give [place] permission to discuss obesity behavioral modification therapy today.  ASSESS: Chere has the  diagnosis of obesity and her BMI today is 41.99 Daziyah is in the action stage of change     ADVISE: Phoebie was educated on the multiple health risks of obesity as well as the benefit of weight loss to improve her health. She was advised of the need for long term treatment and the importance of lifestyle modifications to improve her current health and to decrease her risk of future health problems.  AGREE: Multiple dietary modification options and treatment options were discussed and  Sotiria agreed to follow the recommendations documented in the above note.  ARRANGE: Kamille was educated on the importance of frequent visits to treat obesity as outlined per CMS and USPSTF guidelines and agreed to schedule her next follow up appointment today.  I, Remi Deter, CMA, am acting as Location manager for [name], [place].  I have reviewed the above documentation for accuracy and completeness, and I agree with the above. [name], FNP-C.

## 2018-07-21 ENCOUNTER — Encounter (INDEPENDENT_AMBULATORY_CARE_PROVIDER_SITE_OTHER): Payer: Self-pay | Admitting: Family Medicine

## 2018-07-25 ENCOUNTER — Ambulatory Visit (INDEPENDENT_AMBULATORY_CARE_PROVIDER_SITE_OTHER): Payer: Medicare Other | Admitting: *Deleted

## 2018-07-25 DIAGNOSIS — I6340 Cerebral infarction due to embolism of unspecified cerebral artery: Secondary | ICD-10-CM | POA: Diagnosis not present

## 2018-07-25 NOTE — Progress Notes (Signed)
[facility] Summary Report / Loop Recorder

## 2018-08-02 ENCOUNTER — Other Ambulatory Visit (INDEPENDENT_AMBULATORY_CARE_PROVIDER_SITE_OTHER): Payer: Self-pay | Admitting: Family Medicine

## 2018-08-02 ENCOUNTER — Ambulatory Visit (INDEPENDENT_AMBULATORY_CARE_PROVIDER_SITE_OTHER): Payer: Medicare Other | Admitting: Family Medicine

## 2018-08-02 VITALS — BP 95/63 | HR 83 | Temp 97.6°F | Ht 63.0 in | Wt 232.0 lb

## 2018-08-02 DIAGNOSIS — E119 Type 2 diabetes mellitus without complications: Secondary | ICD-10-CM | POA: Diagnosis not present

## 2018-08-02 DIAGNOSIS — E559 Vitamin D deficiency, unspecified: Secondary | ICD-10-CM | POA: Diagnosis not present

## 2018-08-02 DIAGNOSIS — Z6841 Body Mass Index (BMI) 40.0 and over, adult: Secondary | ICD-10-CM | POA: Diagnosis not present

## 2018-08-02 DIAGNOSIS — E639 Nutritional deficiency, unspecified: Secondary | ICD-10-CM | POA: Diagnosis not present

## 2018-08-02 DIAGNOSIS — E538 Deficiency of other specified B group vitamins: Secondary | ICD-10-CM | POA: Diagnosis not present

## 2018-08-02 DIAGNOSIS — D508 Other iron deficiency anemias: Secondary | ICD-10-CM | POA: Diagnosis not present

## 2018-08-03 ENCOUNTER — Encounter (INDEPENDENT_AMBULATORY_CARE_PROVIDER_SITE_OTHER): Payer: Self-pay | Admitting: Family Medicine

## 2018-08-03 LAB — CBC WITH DIFFERENTIAL
Basophils Absolute: 0.1 10*3/uL (ref 0.0–0.2)
Basos: 1 %
EOS (ABSOLUTE): 0.1 10*3/uL (ref 0.0–0.4)
Eos: 2 %
Hematocrit: 36.6 % (ref 34.0–46.6)
Hemoglobin: 11.5 g/dL (ref 11.1–15.9)
Immature Grans (Abs): 0 10*3/uL (ref 0.0–0.1)
Immature Granulocytes: 0 %
Lymphocytes Absolute: 1.3 10*3/uL (ref 0.7–3.1)
Lymphs: 17 %
MCH: 23.9 pg — ABNORMAL LOW (ref 26.6–33.0)
MCHC: 31.4 g/dL — ABNORMAL LOW (ref 31.5–35.7)
MCV: 76 fL — ABNORMAL LOW (ref 79–97)
Monocytes Absolute: 0.6 10*3/uL (ref 0.1–0.9)
Monocytes: 8 %
Neutrophils Absolute: 5.6 10*3/uL (ref 1.4–7.0)
Neutrophils: 72 %
RBC: 4.82 x10E6/uL (ref 3.77–5.28)
RDW: 14.7 % (ref 12.3–15.4)
WBC: 7.7 10*3/uL (ref 3.4–10.8)

## 2018-08-03 LAB — ANEMIA PANEL
Ferritin: 31 ng/mL (ref 15–150)
Folate, Hemolysate: 464.9 ng/mL
Folate, RBC: 1253 ng/mL (ref >498)
Hematocrit: 37.1 % (ref 34.0–46.6)
Iron Saturation: 11 % — ABNORMAL LOW (ref 15–55)
Iron: 37 ug/dL (ref 27–139)
Retic Ct Pct: 1.4 % (ref 0.6–2.6)
Total Iron Binding Capacity: 343 ug/dL (ref 250–450)
UIBC: 306 ug/dL (ref 118–369)
Vitamin B-12: >2000 pg/mL — ABNORMAL HIGH (ref 232–1245)

## 2018-08-03 LAB — INSULIN, RANDOM: INSULIN: 6.9 u[IU]/mL (ref 2.6–24.9)

## 2018-08-03 LAB — COMPREHENSIVE METABOLIC PANEL
ALT: 8 IU/L (ref 0–32)
AST: 17 IU/L (ref 0–40)
Albumin/Globulin Ratio: 1.9 (ref 1.2–2.2)
Albumin: 4.4 g/dL (ref 3.6–4.8)
Alkaline Phosphatase: 90 IU/L (ref 39–117)
BUN/Creatinine Ratio: 19 (ref 12–28)
BUN: 17 mg/dL (ref 8–27)
Bilirubin Total: 0.3 mg/dL (ref 0.0–1.2)
CO2: 25 mmol/L (ref 20–29)
Calcium: 9.5 mg/dL (ref 8.7–10.3)
Chloride: 98 mmol/L (ref 96–106)
Creatinine, Ser: 0.91 mg/dL (ref 0.57–1.00)
GFR calc Af Amer: 76 mL/min/{1.73_m2} (ref >59)
GFR calc non Af Amer: 65 mL/min/{1.73_m2} (ref >59)
Globulin, Total: 2.3 g/dL (ref 1.5–4.5)
Glucose: 109 mg/dL — ABNORMAL HIGH (ref 65–99)
Potassium: 4.1 mmol/L (ref 3.5–5.2)
Sodium: 140 mmol/L (ref 134–144)
Total Protein: 6.7 g/dL (ref 6.0–8.5)

## 2018-08-03 LAB — HEMOGLOBIN A1C
Est. average glucose Bld gHb Est-mCnc: 108 mg/dL
Hgb A1c MFr Bld: 5.4 % (ref 4.8–5.6)

## 2018-08-03 LAB — VITAMIN D 25 HYDROXY (VIT D DEFICIENCY, FRACTURES): Vit D, 25-Hydroxy: 72.2 ng/mL (ref 30.0–100.0)

## 2018-08-03 NOTE — Progress Notes (Signed)
Office: [phone]  /  Fax: [phone]   HPI:   Chief Complaint: OBESITY [name] [name] is here to discuss her progress with her obesity treatment plan. She is on the Category 2 plan and is following her eating plan approximately 85 to 90 % of the time. She states she is doing cardio 45 minutes 4 times per week. [name] [name] has been stressed with her Mom in the hospital. She has been sticking to the plan well.  Her weight is 232 lb (105.2 kg) today and has had a weight loss of 5 pounds over a period of 2 weeks since her last visit. She has lost 26 lbs since starting treatment with [place].  Vitamin D deficiency [name] [name] has a diagnosis of vitamin D deficiency. She is currently taking vit D and is stable, but not at goal. She denies nausea, vomiting, or muscle weakness.  Iron Deficiency Anemia [name] [name] has a diagnosis of anemia. She notes fatigue and is on iron supplementation.   Diabetes II [name] [name] has a diagnosis of diabetes type II and is well controlled. Her last A1c was 6.9 on 04/27/18. She has been working on intensive lifestyle modifications including diet, exercise, and weight loss to help control her blood glucose levels.  ALLERGIES: Allergies  Allergen Reactions  . Ambien [Zolpidem] Other (See Comments)    NIGHT TERRORS  . Atorvastatin Other (See Comments)    Muscle weakness Muscle Aches  . Codeine Nausea And Vomiting  . Penicillins Itching and Other (See Comments)    Made nose run uncontrollably Has patient had a PCN reaction causing immediate rash, facial/tongue/throat swelling, SOB or lightheadedness with hypotension: No Has patient had a PCN reaction causing severe rash involving mucus membranes or skin necrosis: No Has patient had a PCN reaction that required hospitalization No Has patient had a PCN reaction occurring within the last 10 years: No If all of the above answers are "NO", then may proceed with Cephalosporin use.  . Simvastatin Other (See Comments)    Muscle weakness  .  Byetta 10 Mcg Pen [Exenatide] Nausea Only    MEDICATIONS: Current Outpatient Medications on File Prior to Visit  Medication Sig Dispense Refill  . b complex vitamins capsule Take 1 capsule by mouth daily. 30 capsule 2  . beta carotene w/minerals (OCUVITE) tablet Take 1 tablet by mouth daily.    . Biotin (BIOTIN 5000) 5 MG CAPS Take by mouth.    . Calcium-Vitamin D-Vitamin K (VIACTIV PO) Take by mouth.    . Cholecalciferol (VITAMIN D3 PO) Take by mouth.    . Coenzyme Q10 (CO Q-10) 400 MG CAPS Take by mouth.    . gabapentin (NEURONTIN) 300 MG capsule Take 2 capsules (600 mg total) by mouth at bedtime. 180 capsule 4  . losartan-hydrochlorothiazide (HYZAAR) 100-25 MG tablet Take 1 tablet by mouth daily. Take 1/2 tab daily    . magnesium oxide (MAG-OX) 400 MG tablet Take 400 mg by mouth at bedtime.     . metFORMIN (GLUCOPHAGE) 500 MG tablet Take 1 tablet (500 mg total) by mouth daily with breakfast. 90 tablet 0  . OVER THE COUNTER MEDICATION P5P vitamin    . rosuvastatin (CRESTOR) 20 MG tablet Take 1 tablet (20 mg total) by mouth daily. (Patient taking differently: Take 20 mg by mouth at bedtime. ) 30 tablet 0  . Vitamin D, Ergocalciferol, (DRISDOL) 50000 units CAPS capsule Take 1 capsule (50,000 Units total) by mouth every 7 (seven) days. 12 capsule 0  . XARELTO 20 MG TABS  tablet Take 20 mg by mouth daily.     No current facility-administered medications on file prior to visit.     PAST MEDICAL HISTORY: Past Medical History:  Diagnosis Date  . Anemia   . Arthritis    "knees, hips, possibly back" (04/08/2016)  . Atrial septal aneurysm   . B12 deficiency    a. following gastric bypass.  . Back pain   . Chronic lower back pain    spondylosis  . Cold feet   . Daily headache    "short ones for the last month or 2" (04/08/2016)  . Depression   . Diabetes ([place])   . Dry mouth   . DVT (deep venous thrombosis) ([place])   . Easy bruising   . Floaters in visual field   . GERD  (gastroesophageal reflux disease)    takes Protonix daily "just to protect my stomach; not for reflux" (04/08/2016)  . Hay fever   . Heart murmur    "dx'd by 1 anesthesiologist years and years ago"  . History of loop recorder 03/2016  . Hyperlipidemia   . Hypertension   . Joint pain   . Leg cramp   . Macular degeneration    "just watching"  . Neuropathy   . Nosebleed   . Palpitations   . Peripheral edema   . Peripheral neuropathy    not on any meds  . 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  [name] Pneumonia 12/2017  . Sleep apnea    no CPAP since weight loss after bariatric surgery '12 (04/08/2016)  . Spondylosis   . Stroke ([place]) 04/07/2016   a. 01/2016 and 03/2016. during admission had + LE DVT.  [name] Swelling of extremity   . TIA (transient ischemic attack)   . Trouble in sleeping   . Type 2 diabetes, diet controlled ([place])   . Urinary incontinence   . Weakness     PAST SURGICAL HISTORY: Past Surgical History:  Procedure Laterality Date  . CATARACT EXTRACTION W/ INTRAOCULAR LENS  IMPLANT, BILATERAL Bilateral ~ 2012  . COLONOSCOPY    . EP IMPLANTABLE DEVICE N/A 04/10/2016   Procedure: Loop Recorder Insertion;  Surgeon: Will Meredith Leeds, MD;  Location: [facility];  Service: Cardiovascular;  Laterality: N/A;  . ESOPHAGOGASTRODUODENOSCOPY     in the 70's  . FACIAL COSMETIC SURGERY    . JOINT REPLACEMENT    . 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 SINUS ENDO W/FUSION  02/28/2016   Procedure: ENDOSCOPIC SINUS SURGERY WITH NAVIGATION;  Surgeon: Ruby Cola, MD;  Location: [facility];  Service: ENT;;  . TEE WITHOUT CARDIOVERSION N/A 04/10/2016   Procedure: TRANSESOPHAGEAL ECHOCARDIOGRAM (TEE) POSSIBLE LOOP;  Surgeon: Sanda Klein, MD;  Location: [facility];  Service: Cardiovascular;  Laterality: N/A;  . TONSILLECTOMY  1957  . TOTAL HIP ARTHROPLASTY Right 2008  . TOTAL HIP REVISION Right 03/30/2016   Procedure: RIGHT TOTAL HIP REVISION;  Surgeon: Frederik Pear,  MD;  Location: [facility];  Service: Orthopedics;  Laterality: Right;  . TOTAL KNEE ARTHROPLASTY Bilateral 2001  . VAGINAL HYSTERECTOMY     "left my ovaries"    SOCIAL HISTORY: Social History   Tobacco Use  . Smoking status: Former Smoker    Packs/day: 1.00    Years: 40.00    Pack years: 40.00    Types: Cigarettes    Last attempt to quit: 08/2006    Years since quitting: 11.9  . Smokeless tobacco: Never Used  Substance Use Topics  . Alcohol use: Yes    Alcohol/week: 0.0 standard drinks    Comment: 04/08/2016 "5-6 drinks/year'  . Drug use: No    Types: Marijuana    Comment: 04/08/2016 "did some marijuana in college"    FAMILY HISTORY: Family History  Problem Relation Age of Onset  . Neuropathy Mother   . Hypertension Mother   . Hyperlipidemia Mother   . Thyroid disease Mother   . Prostate cancer Father   . Lymphoma Father   . Hypertension Father   . Hyperlipidemia Father   . Cancer Father   . Obesity Father   . Congestive Heart Failure Maternal Grandmother   . Lung cancer Maternal Grandfather     ROS: Review of Systems  Constitutional: Positive for malaise/fatigue and weight loss.  Gastrointestinal: Negative for nausea and vomiting.  Musculoskeletal:       Negative for muscle weakness.    PHYSICAL EXAM: Blood pressure 95/63, pulse 83, temperature 97.6 F (36.4 C), temperature source Oral, height _0  (1.6 m), weight 232 lb (105.2 kg), SpO2 98 %. Body mass index is 41.1 kg/m. Physical Exam  Constitutional: She is oriented to person, place, and time. She appears well-developed and well-nourished.  Cardiovascular: Normal rate.  Pulmonary/Chest: Effort normal.  Musculoskeletal: Normal range of motion.  Neurological: She is oriented to person, place, and time.  Skin: Skin is warm and dry.  Psychiatric: She has a normal mood and affect. Her behavior is normal.  Vitals reviewed.   RECENT LABS AND TESTS: BMET    Component Value Date/Time   NA 140 08/02/2018  0844   NA 139 04/29/2017 0944   K 4.1 08/02/2018 0844   K 3.9 04/29/2017 0944   CL 98 08/02/2018 0844   CO2 25 08/02/2018 0844   CO2 29 04/29/2017 0944   GLUCOSE 109 (H) 08/02/2018 0844   GLUCOSE 120 04/29/2017 0944   BUN 17 08/02/2018 0844   BUN 15.8 04/29/2017 0944   CREATININE 0.91 08/02/2018 0844   CREATININE 0.8 04/29/2017 0944   CALCIUM 9.5 08/02/2018 0844   CALCIUM 9.2 04/29/2017 0944   GFRNONAA 65 08/02/2018 0844   GFRAA 76 08/02/2018 0844   Lab Results  Component Value Date   HGBA1C 5.4 08/02/2018   HGBA1C 6.9 (H) 04/27/2018   HGBA1C 6.0 (H) 04/09/2016   HGBA1C 6.2 (H) 01/31/2016   HGBA1C (H) 03/15/2010    7.7 (NOTE)                                                                       According to the ADA Clinical Practice Recommendations for 2011, when HbA1c is used as a screening test:   >=6.5%   Diagnostic of Diabetes Mellitus           (if abnormal result  is confirmed)  5.7-6.4%   Increased risk of developing Diabetes Mellitus  References:Diagnosis and Classification of Diabetes Mellitus,Diabetes QIHK,7425,95(GLOVF 1):S62-S69 and Standards of Medical Care in         Diabetes - 2011,Diabetes Care,2011,34  (Suppl 1):S11-S61.   Lab Results  Component Value Date   INSULIN 6.9 08/02/2018   INSULIN 7.0 04/27/2018   CBC    Component Value Date/Time   WBC 7.7 08/02/2018 0844  WBC 7.9 04/29/2017 0944   WBC 16.0 (H) 04/13/2016 1105   RBC 4.82 08/02/2018 0844   RBC 4.53 04/29/2017 0944   RBC 3.65 (L) 04/13/2016 1105   HGB 11.5 08/02/2018 0844   HGB 11.9 04/29/2017 0944   HCT 37.1 08/02/2018 0856   HCT 36.9 04/29/2017 0944   PLT 275 04/27/2018 0000   MCV 76 (L) 08/02/2018 0844   MCV 81.5 04/29/2017 0944   MCH 23.9 (L) 08/02/2018 0844   MCH 26.3 04/29/2017 0944   MCH 26.3 04/13/2016 1105   MCHC 31.4 (L) 08/02/2018 0844   MCHC 32.2 04/29/2017 0944   MCHC 31.5 04/13/2016 1105   RDW 14.7 08/02/2018 0844   RDW 14.2 04/29/2017 0944   LYMPHSABS 1.3 08/02/2018  0844   LYMPHSABS 1.7 04/29/2017 0944   MONOABS 0.6 04/29/2017 0944   EOSABS 0.1 08/02/2018 0844   BASOSABS 0.1 08/02/2018 0844   BASOSABS 0.0 04/29/2017 0944   Iron/TIBC/Ferritin/ %Sat    Component Value Date/Time   IRON 37 08/02/2018 0856   IRON 51 04/29/2017 0944   TIBC 343 08/02/2018 0856   TIBC 343 04/29/2017 0944   FERRITIN 31 08/02/2018 0856   FERRITIN 23 04/29/2017 0944   IRONPCTSAT 11 (L) 08/02/2018 0856   IRONPCTSAT 15 (L) 04/29/2017 0944   IRONPCTSAT 12 (L) 05/29/2011 1436   Lipid Panel     Component Value Date/Time   CHOL 152 04/27/2018 0000   TRIG 144 04/27/2018 0000   HDL 60 04/27/2018 0000   CHOLHDL 2.7 04/09/2016 0155   VLDL 27 04/09/2016 0155   LDLCALC 63 04/27/2018 0000   Hepatic Function Panel     Component Value Date/Time   PROT 6.7 08/02/2018 0844   PROT 6.8 04/29/2017 0944   ALBUMIN 4.4 08/02/2018 0844   ALBUMIN 3.4 (L) 04/29/2017 0944   AST 17 08/02/2018 0844   AST 17 04/29/2017 0944   ALT 8 08/02/2018 0844   ALT 14 04/29/2017 0944   ALKPHOS 90 08/02/2018 0844   ALKPHOS 116 04/29/2017 0944   BILITOT 0.3 08/02/2018 0844   BILITOT 0.41 04/29/2017 0944   Lab Results  Component Value Date   TSH 2.000 04/27/2018   Results for LIBERTI, APPLETON (MRN [id number]) as of 08/03/2018 10:36  Ref. Range 08/02/2018 08:44  Vitamin D, 25-Hydroxy Latest Ref Range: 30.0 - 100.0 ng/mL 72.2   ASSESSMENT AND PLAN: Vitamin D deficiency - Plan: VITAMIN D 25 Hydroxy (Vit-D Deficiency, Fractures)  Other iron deficiency anemia - Plan: CBC With Differential, Anemia panel  Type 2 diabetes mellitus without complication, without long-term current use of insulin (HCC) - Plan: Comprehensive metabolic panel, Hemoglobin A1c, Insulin, random, Microalbumin / creatinine urine ratio  Class 3 severe obesity with serious comorbidity and body mass index (BMI) of 40.0 to 44.9 in adult, unspecified obesity type (HCC)  PLAN:  Vitamin D Deficiency [name] [name] was informed that low  vitamin D levels contributes to fatigue and are associated with obesity, breast, and colon cancer. She agrees to continue to take prescription Vit D _0 ,000 IU every week and will follow up for routine testing of vitamin D, at least 2-3 times per year. She was informed of the risk of over-replacement of vitamin D and agrees to not increase her dose unless she discusses this with [place] first. We will recheck her vitamin D level today and she agrees to follow up in 2 weeks.  Iron Deficiency Anemia [name] [name] agrees to continue taking iron. We will check her CBC and an anemia panel today.  She will follow up in 2 weeks.  Diabetes II [name] [name] has been given extensive diabetes education by myself today including ideal fasting and post-prandial blood glucose readings, individual ideal Hgb A1c goals, and hypoglycemia prevention. We discussed the importance of good blood sugar control to decrease the likelihood of diabetic complications such as nephropathy, neuropathy, limb loss, blindness, coronary artery disease, and death. We discussed the importance of intensive lifestyle modification including diet, exercise and weight loss as the first line treatment for diabetes. We will check her A1c, fasting insulin.  [name] [name] agrees to continue her diabetes medications and will follow up at the agreed upon time. We planned on getting microalbumin creatine ratio but she was unable to produce urine sample.   Obesity [name] [name] is currently in the action stage of change. As such, her goal is to continue with weight loss efforts. She has agreed to follow the Category 2 plan. [name] [name] has been instructed to continue doing cardio 4 times per week. We discussed the following Behavioral Modification Strategies today: increasing lean protein intake, increase H2O intake, holiday eating strategies, and avoiding temptations.  [name] [name] has agreed to follow up with our clinic in 2 weeks. She was informed of the importance of frequent follow up  visits to maximize her success with intensive lifestyle modifications for her multiple health conditions.   OBESITY BEHAVIORAL INTERVENTION VISIT  Today's visit was # 8   Starting weight: 258 lbs Starting date: 04/27/18 Today's weight : Weight: 232 lb (105.2 kg)  Today's date: 08/02/2018 Total lbs lost to date: 26 At least 15 minutes were spent on discussing the following behavioral intervention visit.  ASK: We discussed the diagnosis of obesity with [name] [name] today and [name] [name] agreed to give [place] permission to discuss obesity behavioral modification therapy today.  ASSESS: [name] [name] has the diagnosis of obesity and her BMI today is 41.11. [name] [name] is in the action stage of change.   ADVISE: [name] [name] was educated on the multiple health risks of obesity as well as the benefit of weight loss to improve her health. She was advised of the need for long term treatment and the importance of lifestyle modifications to improve her current health and to decrease her risk of future health problems.  AGREE: Multiple dietary modification options and treatment options were discussed and [name] [name] agreed to follow the recommendations documented in the above note.  ARRANGE: [name] [name] was educated on the importance of frequent visits to treat obesity as outlined per CMS and USPSTF guidelines and agreed to schedule her next follow up appointment today.  Lenward Chancellor, am acting as Location manager for Georgianne Fick, FNP.  I have reviewed the above documentation for accuracy and completeness, and I agree with the above.  - Lorain Fettes, FNP-C.

## 2018-08-16 ENCOUNTER — Encounter (INDEPENDENT_AMBULATORY_CARE_PROVIDER_SITE_OTHER): Payer: Self-pay | Admitting: Family Medicine

## 2018-08-16 ENCOUNTER — Ambulatory Visit (INDEPENDENT_AMBULATORY_CARE_PROVIDER_SITE_OTHER): Payer: Medicare Other | Admitting: Family Medicine

## 2018-08-16 VITALS — BP 96/65 | HR 97 | Temp 98.1°F | Ht 63.0 in | Wt 230.0 lb

## 2018-08-16 DIAGNOSIS — Z6841 Body Mass Index (BMI) 40.0 and over, adult: Secondary | ICD-10-CM | POA: Diagnosis not present

## 2018-08-16 DIAGNOSIS — I10 Essential (primary) hypertension: Secondary | ICD-10-CM

## 2018-08-16 DIAGNOSIS — R748 Abnormal levels of other serum enzymes: Secondary | ICD-10-CM | POA: Diagnosis not present

## 2018-08-16 DIAGNOSIS — E559 Vitamin D deficiency, unspecified: Secondary | ICD-10-CM | POA: Diagnosis not present

## 2018-08-16 DIAGNOSIS — E119 Type 2 diabetes mellitus without complications: Secondary | ICD-10-CM

## 2018-08-17 LAB — MICROALBUMIN / CREATININE URINE RATIO
Creatinine, Urine: 51.5 mg/dL
Microalb/Creat Ratio: <5.8 mg/g creat (ref 0.0–30.0)
Microalbumin, Urine: <3 ug/mL

## 2018-08-23 ENCOUNTER — Encounter (INDEPENDENT_AMBULATORY_CARE_PROVIDER_SITE_OTHER): Payer: Self-pay | Admitting: Family Medicine

## 2018-08-23 NOTE — Progress Notes (Signed)
Office: [phone]  /  Fax: [phone]   HPI:   Chief Complaint: OBESITY [name] [name] is here to discuss her progress with her obesity treatment plan. She is on the Category 2 plan and is following her eating plan approximately 85 % of the time. She states she is doing silver sneakers and biking 30 minutes 4 times per week. [name] [name] is doing well on the Category 2 plan. She plans on "cheating" over the next week with holiday meals and some cookies she has been craving. Her weight is 230 lb (104.3 kg) today and has had a weight loss of 2 pounds over a period of 2 weeks since her last visit. She has lost 28 lbs since starting treatment with [place].  Diabetes II [name] [name] has a diagnosis of diabetes type II. [name] [name] does not check her blood sugar. She is on metformin and she denies any hypoglycemic episodes. Her last A1c was at goal (5.4) and is much improved from 6.9.  She has been working on intensive lifestyle modifications including diet, exercise, and weight loss to help control her blood glucose levels. She denies nausea, vomiting or diarrhea.  Vitamin D deficiency [name] [name] has a diagnosis of vitamin D deficiency. She is currently taking prescription vit D and denies nausea, vomiting or muscle weakness.  Hypertension [name] [name] is a 67 y.o. female with hypertension. Her blood pressure is low today at 96/65, but she is asymptomatic. [name] [name] denies dizziness. [name] [name] has decreased her blood pressure medication to half a pill recently. She is working weight loss to help control her blood pressure with the goal of decreasing her risk of heart attack and stroke.  Elevated B12  [name] [name] has an elevated B12 level and she is taking OTC B12..   ALLERGIES: Allergies  Allergen Reactions  . Ambien [Zolpidem] Other (See Comments)    NIGHT TERRORS  . Atorvastatin Other (See Comments)    Muscle weakness Muscle Aches  . Codeine Nausea And Vomiting  . Penicillins Itching and Other (See Comments)      Made nose run uncontrollably Has patient had a PCN reaction causing immediate rash, facial/tongue/throat swelling, SOB or lightheadedness with hypotension: No Has patient had a PCN reaction causing severe rash involving mucus membranes or skin necrosis: No Has patient had a PCN reaction that required hospitalization No Has patient had a PCN reaction occurring within the last 10 years: No If all of the above answers are "NO", then may proceed with Cephalosporin use.  . Simvastatin Other (See Comments)    Muscle weakness  . Byetta 10 Mcg Pen [Exenatide] Nausea Only    MEDICATIONS: Current Outpatient Medications on File Prior to Visit  Medication Sig Dispense Refill  . beta carotene w/minerals (OCUVITE) tablet Take 1 tablet by mouth daily.    . Biotin (BIOTIN 5000) 5 MG CAPS Take by mouth.    . Calcium-Vitamin D-Vitamin K (VIACTIV PO) Take by mouth.    . Cholecalciferol (VITAMIN D3 PO) Take by mouth.    . Coenzyme Q10 (CO Q-10) 400 MG CAPS Take by mouth.    . gabapentin (NEURONTIN) 300 MG capsule Take 2 capsules (600 mg total) by mouth at bedtime. 180 capsule 4  . magnesium oxide (MAG-OX) 400 MG tablet Take 400 mg by mouth at bedtime.     . metFORMIN (GLUCOPHAGE) 500 MG tablet Take 1 tablet (500 mg total) by mouth daily with breakfast. 90 tablet 0  . OVER THE COUNTER MEDICATION P5P vitamin    . rosuvastatin (  CRESTOR) 20 MG tablet Take 1 tablet (20 mg total) by mouth daily. (Patient taking differently: Take 20 mg by mouth at bedtime. ) 30 tablet 0  . Vitamin D, Ergocalciferol, (DRISDOL) 50000 units CAPS capsule Take 1 capsule (50,000 Units total) by mouth every 7 (seven) days. 12 capsule 0  . XARELTO 20 MG TABS tablet Take 20 mg by mouth daily.     No current facility-administered medications on file prior to visit.     PAST MEDICAL HISTORY: Past Medical History:  Diagnosis Date  . Anemia   . Arthritis    "knees, hips, possibly back" (04/08/2016)  . Atrial septal aneurysm   . B12  deficiency    a. following gastric bypass.  . Back pain   . Chronic lower back pain    spondylosis  . Cold feet   . Daily headache    "short ones for the last month or 2" (04/08/2016)  . Depression   . Diabetes ([place])   . Dry mouth   . DVT (deep venous thrombosis) ([place])   . Easy bruising   . Floaters in visual field   . GERD (gastroesophageal reflux disease)    takes Protonix daily "just to protect my stomach; not for reflux" (04/08/2016)  . Hay fever   . Heart murmur    "dx'd by 1 anesthesiologist years and years ago"  . History of loop recorder 03/2016  . Hyperlipidemia   . Hypertension   . Joint pain   . Leg cramp   . Macular degeneration    "just watching"  . Neuropathy   . Nosebleed   . Palpitations   . Peripheral edema   . Peripheral neuropathy    not on any meds  . 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  [name] Pneumonia 12/2017  . Sleep apnea    no CPAP since weight loss after bariatric surgery '12 (04/08/2016)  . Spondylosis   . Stroke ([place]) 04/07/2016   a. 01/2016 and 03/2016. during admission had + LE DVT.  [name] Swelling of extremity   . TIA (transient ischemic attack)   . Trouble in sleeping   . Type 2 diabetes, diet controlled ([place])   . Urinary incontinence   . Weakness     PAST SURGICAL HISTORY: Past Surgical History:  Procedure Laterality Date  . CATARACT EXTRACTION W/ INTRAOCULAR LENS  IMPLANT, BILATERAL Bilateral ~ 2012  . COLONOSCOPY    . EP IMPLANTABLE DEVICE N/A 04/10/2016   Procedure: Loop Recorder Insertion;  Surgeon: Will Meredith Leeds, MD;  Location: [facility];  Service: Cardiovascular;  Laterality: N/A;  . ESOPHAGOGASTRODUODENOSCOPY     in the 70's  . FACIAL COSMETIC SURGERY    . JOINT REPLACEMENT    . 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 SINUS ENDO W/FUSION  02/28/2016   Procedure: ENDOSCOPIC SINUS SURGERY WITH NAVIGATION;  Surgeon: Ruby Cola, MD;  Location: [facility];  Service: ENT;;  . TEE WITHOUT CARDIOVERSION N/A  04/10/2016   Procedure: TRANSESOPHAGEAL ECHOCARDIOGRAM (TEE) POSSIBLE LOOP;  Surgeon: Sanda Klein, MD;  Location: [facility];  Service: Cardiovascular;  Laterality: N/A;  . TONSILLECTOMY  1957  . TOTAL HIP ARTHROPLASTY Right 2008  . TOTAL HIP REVISION Right 03/30/2016   Procedure: RIGHT TOTAL HIP REVISION;  Surgeon: Frederik Pear, MD;  Location: [facility];  Service: Orthopedics;  Laterality: Right;  . TOTAL KNEE ARTHROPLASTY Bilateral 2001  . VAGINAL HYSTERECTOMY     "left my ovaries"  SOCIAL HISTORY: Social History   Tobacco Use  . Smoking status: Former Smoker    Packs/day: 1.00    Years: 40.00    Pack years: 40.00    Types: Cigarettes    Last attempt to quit: 08/2006    Years since quitting: 12.0  . Smokeless tobacco: Never Used  Substance Use Topics  . Alcohol use: Yes    Alcohol/week: 0.0 standard drinks    Comment: 04/08/2016 "5-6 drinks/year'  . Drug use: No    Types: Marijuana    Comment: 04/08/2016 "did some marijuana in college"    FAMILY HISTORY: Family History  Problem Relation Age of Onset  . Neuropathy Mother   . Hypertension Mother   . Hyperlipidemia Mother   . Thyroid disease Mother   . Prostate cancer Father   . Lymphoma Father   . Hypertension Father   . Hyperlipidemia Father   . Cancer Father   . Obesity Father   . Congestive Heart Failure Maternal Grandmother   . Lung cancer Maternal Grandfather     ROS: Review of Systems  Constitutional: Positive for weight loss.  Gastrointestinal: Negative for diarrhea, nausea and vomiting.  Musculoskeletal:       Negative for muscle weakness  Neurological: Negative for dizziness.  Endo/Heme/Allergies:       Negative for hypoglycemia    PHYSICAL EXAM: Blood pressure 96/65, pulse 97, temperature 98.1 F (36.7 C), temperature source Oral, height _0  (1.6 m), weight 230 lb (104.3 kg), SpO2 99 %. Body mass index is 40.74 kg/m. Physical Exam  Constitutional: She is oriented to person, place, and time.  She appears well-developed and well-nourished.  Cardiovascular: Normal rate.  Pulmonary/Chest: Effort normal.  Musculoskeletal: Normal range of motion.  Neurological: She is oriented to person, place, and time.  Skin: Skin is warm and dry.  Psychiatric: She has a normal mood and affect. Her behavior is normal.  Vitals reviewed.   RECENT LABS AND TESTS: BMET    Component Value Date/Time   NA 140 08/02/2018 0844   NA 139 04/29/2017 0944   K 4.1 08/02/2018 0844   K 3.9 04/29/2017 0944   CL 98 08/02/2018 0844   CO2 25 08/02/2018 0844   CO2 29 04/29/2017 0944   GLUCOSE 109 (H) 08/02/2018 0844   GLUCOSE 120 04/29/2017 0944   BUN 17 08/02/2018 0844   BUN 15.8 04/29/2017 0944   CREATININE 0.91 08/02/2018 0844   CREATININE 0.8 04/29/2017 0944   CALCIUM 9.5 08/02/2018 0844   CALCIUM 9.2 04/29/2017 0944   GFRNONAA 65 08/02/2018 0844   GFRAA 76 08/02/2018 0844   Lab Results  Component Value Date   HGBA1C 5.4 08/02/2018   HGBA1C 6.9 (H) 04/27/2018   HGBA1C 6.0 (H) 04/09/2016   HGBA1C 6.2 (H) 01/31/2016   HGBA1C (H) 03/15/2010    7.7 (NOTE)                                                                       According to the ADA Clinical Practice Recommendations for 2011, when HbA1c is used as a screening test:   >=6.5%   Diagnostic of Diabetes Mellitus           (if abnormal result  is confirmed)  5.7-6.4%   Increased risk of developing Diabetes Mellitus  References:Diagnosis and Classification of Diabetes Mellitus,Diabetes SNKN,3976,73(ALPFX 1):S62-S69 and Standards of Medical Care in         Diabetes - 2011,Diabetes TKWI,0973,53  (Suppl 1):S11-S61.   Lab Results  Component Value Date   INSULIN 6.9 08/02/2018   INSULIN 7.0 04/27/2018   CBC    Component Value Date/Time   WBC 7.7 08/02/2018 0844   WBC 7.9 04/29/2017 0944   WBC 16.0 (H) 04/13/2016 1105   RBC 4.82 08/02/2018 0844   RBC 4.53 04/29/2017 0944   RBC 3.65 (L) 04/13/2016 1105   HGB 11.5 08/02/2018 0844   HGB  11.9 04/29/2017 0944   HCT 37.1 08/02/2018 0856   HCT 36.9 04/29/2017 0944   PLT 275 04/27/2018 0000   MCV 76 (L) 08/02/2018 0844   MCV 81.5 04/29/2017 0944   MCH 23.9 (L) 08/02/2018 0844   MCH 26.3 04/29/2017 0944   MCH 26.3 04/13/2016 1105   MCHC 31.4 (L) 08/02/2018 0844   MCHC 32.2 04/29/2017 0944   MCHC 31.5 04/13/2016 1105   RDW 14.7 08/02/2018 0844   RDW 14.2 04/29/2017 0944   LYMPHSABS 1.3 08/02/2018 0844   LYMPHSABS 1.7 04/29/2017 0944   MONOABS 0.6 04/29/2017 0944   EOSABS 0.1 08/02/2018 0844   BASOSABS 0.1 08/02/2018 0844   BASOSABS 0.0 04/29/2017 0944   Iron/TIBC/Ferritin/ %Sat    Component Value Date/Time   IRON 37 08/02/2018 0856   IRON 51 04/29/2017 0944   TIBC 343 08/02/2018 0856   TIBC 343 04/29/2017 0944   FERRITIN 31 08/02/2018 0856   FERRITIN 23 04/29/2017 0944   IRONPCTSAT 11 (L) 08/02/2018 0856   IRONPCTSAT 15 (L) 04/29/2017 0944   IRONPCTSAT 12 (L) 05/29/2011 1436   Lipid Panel     Component Value Date/Time   CHOL 152 04/27/2018 0000   TRIG 144 04/27/2018 0000   HDL 60 04/27/2018 0000   CHOLHDL 2.7 04/09/2016 0155   VLDL 27 04/09/2016 0155   LDLCALC 63 04/27/2018 0000   Hepatic Function Panel     Component Value Date/Time   PROT 6.7 08/02/2018 0844   PROT 6.8 04/29/2017 0944   ALBUMIN 4.4 08/02/2018 0844   ALBUMIN 3.4 (L) 04/29/2017 0944   AST 17 08/02/2018 0844   AST 17 04/29/2017 0944   ALT 8 08/02/2018 0844   ALT 14 04/29/2017 0944   ALKPHOS 90 08/02/2018 0844   ALKPHOS 116 04/29/2017 0944   BILITOT 0.3 08/02/2018 0844   BILITOT 0.41 04/29/2017 0944   Lab Results  Component Value Date   TSH 2.000 04/27/2018    Results for KISSIE, ZIOLKOWSKI (MRN [id number]) as of 08/23/2018 14:11  Ref. Range 08/02/2018 08:44  Vitamin D, 25-Hydroxy Latest Ref Range: 30.0 - 100.0 ng/mL 72.2   ASSESSMENT AND PLAN: Type 2 diabetes mellitus without complication, without long-term current use of insulin ([place]) - Plan: Microalbumin / creatinine urine  ratio  Vitamin D deficiency  Essential hypertension  Increased vitamin B12 level  Class 3 severe obesity with serious comorbidity and body mass index (BMI) of 40.0 to 44.9 in adult, unspecified obesity type ([place])  PLAN:  Diabetes II Jaiyla has been given extensive diabetes education by myself today including ideal fasting and post-prandial blood glucose readings, individual ideal Hgb A1c goals and hypoglycemia prevention. We discussed the importance of good blood sugar control to decrease the likelihood of diabetic complications such as nephropathy, neuropathy, limb loss, blindness, coronary artery disease, and death. We discussed the  importance of intensive lifestyle modification including diet, exercise and weight loss as the first line treatment for diabetes. Phoebe agrees to continue metformin once daily and will follow up at the agreed upon time.  Vitamin D Deficiency Latitia was informed that low vitamin D levels contributes to fatigue and are associated with obesity, breast, and colon cancer. She agrees to discontinue prescription Vit D and take OTC Vit D 2,000 IU BID and will follow up for routine testing of vitamin D, at least 2-3 times per year. She was informed of the risk of over-replacement of vitamin D and agrees to not increase her dose unless she discusses this with [place] first. Fannye agrees to follow up as directed.  Hypertension We discussed sodium restriction, working on healthy weight loss, and a regular exercise program as the means to achieve improved blood pressure control. Jacayla agreed with this plan and agreed to follow up as directed. We will evaluate her blood pressure without medication at the next visit. She agreed to stop taking losartan-HCTZ.  Elevated B12  Ronae will stop taking B12 supplement and will follow up with our clinic in 2 weeks.  Obesity Aspyn is currently in the action stage of change. As such, her goal is to continue with weight loss efforts She  has agreed to follow the Category 2 plan Lisha will continue to do silver sneakers and biking for 30 minutes 4 times per week for weight loss and overall health benefits. We discussed the following Behavioral Modification Strategies today: planning for success and holiday eating strategies   Raedyn has agreed to follow up with our clinic in 2 weeks. She was informed of the importance of frequent follow up visits to maximize her success with intensive lifestyle modifications for her multiple health conditions.   OBESITY BEHAVIORAL INTERVENTION VISIT  Today's visit was # 9  Starting weight: 258 lbs Starting date: 04/27/2018 Today's weight : 230 lbs  Today's date: 08/16/2018 Total lbs lost to date: 28 At least 15 minutes were spent on discussing the following behavioral intervention visit.   ASK: We discussed the diagnosis of obesity with [name] [name] today and Kemoni agreed to give [place] permission to discuss obesity behavioral modification therapy today.  ASSESS: Rocklyn has the diagnosis of obesity and her BMI today is 40.75 Guida is in the action stage of change   ADVISE: Jazsmine was educated on the multiple health risks of obesity as well as the benefit of weight loss to improve her health. She was advised of the need for long term treatment and the importance of lifestyle modifications to improve her current health and to decrease her risk of future health problems.  AGREE: Multiple dietary modification options and treatment options were discussed and  Retaj agreed to follow the recommendations documented in the above note.  ARRANGE: [name] was educated on the importance of frequent visits to treat obesity as outlined per CMS and USPSTF guidelines and agreed to schedule her next follow up appointment today.  Corey Skains, am acting as Location manager for [name], FNP-C.  I have reviewed the above documentation for accuracy and completeness, and I agree with the  above.  - Ladon Heney, FNP-C.

## 2018-08-26 ENCOUNTER — Ambulatory Visit (INDEPENDENT_AMBULATORY_CARE_PROVIDER_SITE_OTHER): Payer: Medicare Other

## 2018-08-26 DIAGNOSIS — I6340 Cerebral infarction due to embolism of unspecified cerebral artery: Secondary | ICD-10-CM

## 2018-08-29 NOTE — Progress Notes (Signed)
[facility] Summary Report / Loop Recorder

## 2018-08-31 ENCOUNTER — Encounter (INDEPENDENT_AMBULATORY_CARE_PROVIDER_SITE_OTHER): Payer: Self-pay | Admitting: Family Medicine

## 2018-08-31 ENCOUNTER — Ambulatory Visit (INDEPENDENT_AMBULATORY_CARE_PROVIDER_SITE_OTHER): Payer: Medicare Other | Admitting: Family Medicine

## 2018-08-31 VITALS — BP 142/84 | HR 99 | Temp 98.2°F | Ht 63.0 in | Wt 230.0 lb

## 2018-08-31 DIAGNOSIS — Z6841 Body Mass Index (BMI) 40.0 and over, adult: Secondary | ICD-10-CM

## 2018-08-31 DIAGNOSIS — I10 Essential (primary) hypertension: Secondary | ICD-10-CM

## 2018-08-31 DIAGNOSIS — E119 Type 2 diabetes mellitus without complications: Secondary | ICD-10-CM | POA: Diagnosis not present

## 2018-08-31 MED ORDER — METFORMIN HCL 500 MG PO TABS: 500.0000 mg | ORAL_TABLET | Freq: Every day | ORAL | 0 refills | Status: DC

## 2018-08-31 NOTE — Progress Notes (Signed)
Office: [phone]  /  Fax: [phone]   HPI:   Chief Complaint: OBESITY Cana is here to discuss her progress with her obesity treatment plan. She is on the  follow the Category 2 plan and is following her eating plan approximately 60 % of the time. She states she is exercising by riding bike and exercise classed for 30 minutes 3 times per week. Jurnee is happy she maintained weight. She cheated a bit with cookies she had been craving. She like Category 2 plan.  Her weight is 230 lb (104.3 kg) today and she has maintained weight since her last visit. She has lost 28 lbs since starting treatment with [place].  Diabetes II Linet has a diagnosis of diabetes type II. DM is well-controlled with metformin.  Lakesha does not check sugars at home and denies any hypoglycemic episodes. Last A1c was 5.4. She has been working on intensive lifestyle modifications including diet, exercise, and weight loss to help control her blood glucose levels. 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   Hypertension YITTY ROADS is a 67 y.o. female with hypertension.  Jefm Bryant denies chest pain or shortness of breath on exertion. She is working weight loss to help control her blood pressure with the goal of decreasing her risk of heart attack and stroke. Brendas blood pressure is slightly elevated today. Her home blood pressures running around 120/70. Antihypertensives were discontinued at her last visit secondary to hypotension.  She has noticed increased ankle swelling since stopping the hctz.   ALLERGIES: Allergies  Allergen Reactions  . Ambien [Zolpidem] Other (See Comments)    NIGHT TERRORS  . Atorvastatin Other (See Comments)    Muscle weakness Muscle Aches  . Codeine Nausea And Vomiting  . Penicillins Itching and Other (See Comments)    Made nose run uncontrollably Has patient had a PCN reaction causing immediate rash, facial/tongue/throat swelling, SOB or lightheadedness  with hypotension: No Has patient had a PCN reaction causing severe rash involving mucus membranes or skin necrosis: No Has patient had a PCN reaction that required hospitalization No Has patient had a PCN reaction occurring within the last 10 years: No If all of the above answers are "NO", then may proceed with Cephalosporin use.  . Simvastatin Other (See Comments)    Muscle weakness  . Byetta 10 Mcg Pen [Exenatide] Nausea Only    MEDICATIONS: Current Outpatient Medications on File Prior to Visit  Medication Sig Dispense Refill  . beta carotene w/minerals (OCUVITE) tablet Take 1 tablet by mouth daily.    . Biotin (BIOTIN 5000) 5 MG CAPS Take by mouth.    . Calcium-Vitamin D-Vitamin K (VIACTIV PO) Take by mouth.    . Cholecalciferol (VITAMIN D3 PO) Take by mouth.    . Coenzyme Q10 (CO Q-10) 400 MG CAPS Take by mouth.    . gabapentin (NEURONTIN) 300 MG capsule Take 2 capsules (600 mg total) by mouth at bedtime. 180 capsule 4  . magnesium oxide (MAG-OX) 400 MG tablet Take 400 mg by mouth at bedtime.     [name] OVER THE COUNTER MEDICATION P5P vitamin    . rosuvastatin (CRESTOR) 20 MG tablet Take 1 tablet (20 mg total) by mouth daily. (Patient taking differently: Take 20 mg by mouth at bedtime. ) 30 tablet 0  . XARELTO 20 MG TABS tablet Take 20 mg by mouth daily.     No current facility-administered medications on file prior to visit.     PAST MEDICAL  HISTORY: Past Medical History:  Diagnosis Date  . Anemia   . Arthritis    "knees, hips, possibly back" (04/08/2016)  . Atrial septal aneurysm   . B12 deficiency    a. following gastric bypass.  . Back pain   . Chronic lower back pain    spondylosis  . Cold feet   . Daily headache    "short ones for the last month or 2" (04/08/2016)  . Depression   . Diabetes ([place])   . Dry mouth   . DVT (deep venous thrombosis) ([place])   . Easy bruising   . Floaters in visual field   . GERD (gastroesophageal reflux disease)    takes Protonix daily  "just to protect my stomach; not for reflux" (04/08/2016)  . Hay fever   . Heart murmur    "dx'd by 1 anesthesiologist years and years ago"  . History of loop recorder 03/2016  . Hyperlipidemia   . Hypertension   . Joint pain   . Leg cramp   . Macular degeneration    "just watching"  . Neuropathy   . Nosebleed   . Palpitations   . Peripheral edema   . Peripheral neuropathy    not on any meds  . 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  [name] Pneumonia 12/2017  . Sleep apnea    no CPAP since weight loss after bariatric surgery '12 (04/08/2016)  . Spondylosis   . Stroke ([place]) 04/07/2016   a. 01/2016 and 03/2016. during admission had + LE DVT.  [name] Swelling of extremity   . TIA (transient ischemic attack)   . Trouble in sleeping   . Type 2 diabetes, diet controlled ([place])   . Urinary incontinence   . Weakness     PAST SURGICAL HISTORY: Past Surgical History:  Procedure Laterality Date  . CATARACT EXTRACTION W/ INTRAOCULAR LENS  IMPLANT, BILATERAL Bilateral ~ 2012  . COLONOSCOPY    . EP IMPLANTABLE DEVICE N/A 04/10/2016   Procedure: Loop Recorder Insertion;  Surgeon: Will Meredith Leeds, MD;  Location: [facility];  Service: Cardiovascular;  Laterality: N/A;  . ESOPHAGOGASTRODUODENOSCOPY     in the 70's  . FACIAL COSMETIC SURGERY    . JOINT REPLACEMENT    . 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 SINUS ENDO W/FUSION  02/28/2016   Procedure: ENDOSCOPIC SINUS SURGERY WITH NAVIGATION;  Surgeon: Ruby Cola, MD;  Location: [facility];  Service: ENT;;  . TEE WITHOUT CARDIOVERSION N/A 04/10/2016   Procedure: TRANSESOPHAGEAL ECHOCARDIOGRAM (TEE) POSSIBLE LOOP;  Surgeon: Sanda Klein, MD;  Location: [facility];  Service: Cardiovascular;  Laterality: N/A;  . TONSILLECTOMY  1957  . TOTAL HIP ARTHROPLASTY Right 2008  . TOTAL HIP REVISION Right 03/30/2016   Procedure: RIGHT TOTAL HIP REVISION;  Surgeon: Frederik Pear, MD;  Location: [facility];  Service: Orthopedics;  Laterality:  Right;  . TOTAL KNEE ARTHROPLASTY Bilateral 2001  . VAGINAL HYSTERECTOMY     "left my ovaries"    SOCIAL HISTORY: Social History   Tobacco Use  . Smoking status: Former Smoker    Packs/day: 1.00    Years: 40.00    Pack years: 40.00    Types: Cigarettes    Last attempt to quit: 08/2006    Years since quitting: 12.0  . Smokeless tobacco: Never Used  Substance Use Topics  . Alcohol use: Yes    Alcohol/week: 0.0 standard drinks    Comment: 04/08/2016 "5-6 drinks/year'  . Drug use:  No    Types: Marijuana    Comment: 04/08/2016 "did some marijuana in college"    FAMILY HISTORY: Family History  Problem Relation Age of Onset  . Neuropathy Mother   . Hypertension Mother   . Hyperlipidemia Mother   . Thyroid disease Mother   . Prostate cancer Father   . Lymphoma Father   . Hypertension Father   . Hyperlipidemia Father   . Cancer Father   . Obesity Father   . Congestive Heart Failure Maternal Grandmother   . Lung cancer Maternal Grandfather     ROS: Review of Systems  Constitutional: Negative for weight loss.  Respiratory: Negative for shortness of breath.   Cardiovascular: Negative for chest pain.  Endo/Heme/Allergies:       Negative for hypoglycemia    PHYSICAL EXAM: Blood pressure (!) 142/84, pulse 99, temperature 98.2 F (36.8 C), temperature source Oral, height _0  (1.6 m), weight 230 lb (104.3 kg), SpO2 99 %. Body mass index is 40.74 kg/m. Physical Exam  Constitutional: She is oriented to person, place, and time. She appears well-developed and well-nourished.  HENT:  Head: Normocephalic.  Eyes: Pupils are equal, round, and reactive to light.  Neck: Normal range of motion.  Cardiovascular: Normal rate.  Pulmonary/Chest: Effort normal.  Musculoskeletal: Normal range of motion.  Neurological: She is alert and oriented to person, place, and time.  Skin: Skin is warm and dry.  Psychiatric: She has a normal mood and affect. Her behavior is normal.  Vitals  reviewed.   RECENT LABS AND TESTS: BMET    Component Value Date/Time   NA 140 08/02/2018 0844   NA 139 04/29/2017 0944   K 4.1 08/02/2018 0844   K 3.9 04/29/2017 0944   CL 98 08/02/2018 0844   CO2 25 08/02/2018 0844   CO2 29 04/29/2017 0944   GLUCOSE 109 (H) 08/02/2018 0844   GLUCOSE 120 04/29/2017 0944   BUN 17 08/02/2018 0844   BUN 15.8 04/29/2017 0944   CREATININE 0.91 08/02/2018 0844   CREATININE 0.8 04/29/2017 0944   CALCIUM 9.5 08/02/2018 0844   CALCIUM 9.2 04/29/2017 0944   GFRNONAA 65 08/02/2018 0844   GFRAA 76 08/02/2018 0844   Lab Results  Component Value Date   HGBA1C 5.4 08/02/2018   HGBA1C 6.9 (H) 04/27/2018   HGBA1C 6.0 (H) 04/09/2016   HGBA1C 6.2 (H) 01/31/2016   HGBA1C (H) 03/15/2010    7.7 (NOTE)                                                                       According to the ADA Clinical Practice Recommendations for 2011, when HbA1c is used as a screening test:   >=6.5%   Diagnostic of Diabetes Mellitus           (if abnormal result  is confirmed)  5.7-6.4%   Increased risk of developing Diabetes Mellitus  References:Diagnosis and Classification of Diabetes Mellitus,Diabetes UEAV,4098,11(BJYNW 1):S62-S69 and Standards of Medical Care in         Diabetes - 2011,Diabetes Care,2011,34  (Suppl 1):S11-S61.   Lab Results  Component Value Date   INSULIN 6.9 08/02/2018   INSULIN 7.0 04/27/2018   CBC    Component Value Date/Time   WBC 7.7  08/02/2018 0844   WBC 7.9 04/29/2017 0944   WBC 16.0 (H) 04/13/2016 1105   RBC 4.82 08/02/2018 0844   RBC 4.53 04/29/2017 0944   RBC 3.65 (L) 04/13/2016 1105   HGB 11.5 08/02/2018 0844   HGB 11.9 04/29/2017 0944   HCT 37.1 08/02/2018 0856   HCT 36.9 04/29/2017 0944   PLT 275 04/27/2018 0000   MCV 76 (L) 08/02/2018 0844   MCV 81.5 04/29/2017 0944   MCH 23.9 (L) 08/02/2018 0844   MCH 26.3 04/29/2017 0944   MCH 26.3 04/13/2016 1105   MCHC 31.4 (L) 08/02/2018 0844   MCHC 32.2 04/29/2017 0944   MCHC 31.5  04/13/2016 1105   RDW 14.7 08/02/2018 0844   RDW 14.2 04/29/2017 0944   LYMPHSABS 1.3 08/02/2018 0844   LYMPHSABS 1.7 04/29/2017 0944   MONOABS 0.6 04/29/2017 0944   EOSABS 0.1 08/02/2018 0844   BASOSABS 0.1 08/02/2018 0844   BASOSABS 0.0 04/29/2017 0944   Iron/TIBC/Ferritin/ %Sat    Component Value Date/Time   IRON 37 08/02/2018 0856   IRON 51 04/29/2017 0944   TIBC 343 08/02/2018 0856   TIBC 343 04/29/2017 0944   FERRITIN 31 08/02/2018 0856   FERRITIN 23 04/29/2017 0944   IRONPCTSAT 11 (L) 08/02/2018 0856   IRONPCTSAT 15 (L) 04/29/2017 0944   IRONPCTSAT 12 (L) 05/29/2011 1436   Lipid Panel     Component Value Date/Time   CHOL 152 04/27/2018 0000   TRIG 144 04/27/2018 0000   HDL 60 04/27/2018 0000   CHOLHDL 2.7 04/09/2016 0155   VLDL 27 04/09/2016 0155   LDLCALC 63 04/27/2018 0000   Hepatic Function Panel     Component Value Date/Time   PROT 6.7 08/02/2018 0844   PROT 6.8 04/29/2017 0944   ALBUMIN 4.4 08/02/2018 0844   ALBUMIN 3.4 (L) 04/29/2017 0944   AST 17 08/02/2018 0844   AST 17 04/29/2017 0944   ALT 8 08/02/2018 0844   ALT 14 04/29/2017 0944   ALKPHOS 90 08/02/2018 0844   ALKPHOS 116 04/29/2017 0944   BILITOT 0.3 08/02/2018 0844   BILITOT 0.41 04/29/2017 0944   Lab Results  Component Value Date   TSH 2.000 04/27/2018    ASSESSMENT AND PLAN: Type 2 diabetes mellitus without complication, without long-term current use of insulin ([place]) - Plan: metFORMIN (GLUCOPHAGE) 500 MG tablet  Essential hypertension  Class 3 severe obesity with serious comorbidity and body mass index (BMI) of 40.0 to 44.9 in adult, unspecified obesity type ([place])  PLAN: Diabetes II Hajer has been given extensive diabetes education by myself today including ideal fasting and post-prandial blood glucose readings, individual ideal HgA1c goals  and hypoglycemia prevention. We discussed the importance of good blood sugar control to decrease the likelihood of diabetic complications  such as nephropathy, neuropathy, limb loss, blindness, coronary artery disease, and death. We discussed the importance of intensive lifestyle modification including diet, exercise and weight loss as the first line treatment for diabetes. Kamoni agrees to continue her metformin 500 mg qAM #30 with no refills and will follow up at the agreed upon time.  Hypertension We discussed sodium restriction, working on healthy weight loss, and a regular exercise program as the means to achieve improved blood pressure control. Fayne agreed with this plan and agreed to follow up as directed. We will continue to monitor her blood pressure as well as her progress with the above lifestyle modifications. We will consider adding back in HCTZ if ankle swelling continues and BP at next  visit is elevated.   Obesity Olamide is currently in the action stage of change. As such, her goal is to continue with weight loss efforts She has agreed to follow the Category 2 plan Ericha has been instructed continue riding bike and exercise classes for 30 minutes 3 per week for weight loss and overall health benefits. We discussed the following Behavioral Modification Strategies today: holiday eating strategies, planning for success, and avoiding temptations.    Aissata has agreed to follow up with our clinic in 3 weeks. She was informed of the importance of frequent follow up visits to maximize her success with intensive lifestyle modifications for her multiple health conditions.   OBESITY BEHAVIORAL INTERVENTION VISIT  Today's visit was # 10   Starting weight: 258 lb Starting date: 04/27/18 Today's weight : Weight: 230 lb (104.3 kg)  Today's date: 08/31/2018 Total lbs lost to date: 28 At least 15 minutes were spent on discussing the following behavioral intervention visit.   ASK: We discussed the diagnosis of obesity with Jefm Bryant today and Elliana agreed to give [place] permission to discuss obesity behavioral  modification therapy today.  ASSESS: Aaliya has the diagnosis of obesity and her BMI today is 40.75 Dniya is in the action stage of change   ADVISE: Turkessa was educated on the multiple health risks of obesity as well as the benefit of weight loss to improve her health. She was advised of the need for long term treatment and the importance of lifestyle modifications to improve her current health and to decrease her risk of future health problems.  AGREE: Multiple dietary modification options and treatment options were discussed and  Solomia agreed to follow the recommendations documented in the above note.  ARRANGE: Sarann was educated on the importance of frequent visits to treat obesity as outlined per CMS and USPSTF guidelines and agreed to schedule her next follow up appointment today.  I, Renee Ramus, am acting as Location manager for [name], FNP-C.  I have reviewed the above documentation for accuracy and completeness, and I agree with the above.  - Amani Marseille, FNP-C.

## 2018-09-02 ENCOUNTER — Encounter (INDEPENDENT_AMBULATORY_CARE_PROVIDER_SITE_OTHER): Payer: Self-pay | Admitting: Family Medicine

## 2018-09-02 DIAGNOSIS — E119 Type 2 diabetes mellitus without complications: Secondary | ICD-10-CM

## 2018-09-05 MED ORDER — METFORMIN HCL 500 MG PO TABS: 500.0000 mg | ORAL_TABLET | Freq: Every day | ORAL | 0 refills | Status: DC

## 2018-09-12 ENCOUNTER — Encounter (INDEPENDENT_AMBULATORY_CARE_PROVIDER_SITE_OTHER): Payer: Self-pay | Admitting: Family Medicine

## 2018-09-17 LAB — CUP PACEART REMOTE DEVICE CHECK
Implantable Pulse Generator Implant Date: 20170721
MDC IDC SESS DTM: 20191103223958

## 2018-09-22 ENCOUNTER — Encounter (INDEPENDENT_AMBULATORY_CARE_PROVIDER_SITE_OTHER): Payer: Self-pay | Admitting: Family Medicine

## 2018-09-22 ENCOUNTER — Ambulatory Visit (INDEPENDENT_AMBULATORY_CARE_PROVIDER_SITE_OTHER): Payer: Medicare Other | Admitting: Family Medicine

## 2018-09-22 VITALS — BP 113/76 | HR 77 | Temp 98.1°F | Ht 63.0 in | Wt 222.0 lb

## 2018-09-22 DIAGNOSIS — D352 Benign neoplasm of pituitary gland: Secondary | ICD-10-CM | POA: Insufficient documentation

## 2018-09-22 DIAGNOSIS — E119 Type 2 diabetes mellitus without complications: Secondary | ICD-10-CM

## 2018-09-22 DIAGNOSIS — Z6839 Body mass index (BMI) 39.0-39.9, adult: Secondary | ICD-10-CM | POA: Diagnosis not present

## 2018-09-22 NOTE — Progress Notes (Signed)
Office: [phone]  /  Fax: [phone]   HPI:   Chief Complaint: OBESITY [name] [name] is here to discuss her progress with her obesity treatment plan. She is on the Category 2 plan and is following her eating plan approximately 50 % of the time. She states she is doing classes and biking for 30 minutes 3 times per week. [name] [name] has not been seen in 3 weeks since her last follow up. She is very happy with her weight loss. [name] [name] has "cheated", but she has mostly been on the plan. She is 10 pounds away from her low weight after bariatric surgery (213 lbs). Her weight is 222 lb (100.7 kg) today and has had a weight loss of 8 pounds over a period of 3 weeks since her last visit. She has lost 36 lbs since starting treatment with [place].  Diabetes II [name] [name] has a diagnosis of diabetes type II. Sonyia denies any hypoglycemic episodes. Last A1c was at 5.4 She has been working on intensive lifestyle modifications including diet, exercise, and weight loss to help control her blood glucose levels. [name] [name] denies nausea, vomiting or diarrhea.  Pituitary Adenoma [name] [name] has upcoming MRI  To evaluate her pituitary adenoma and she needs kidney and liver function test prior to the MRI. She requests that we obtain this lab today.  ASSESSMENT AND PLAN:  Pituitary adenoma ([place]) - Plan: Comprehensive metabolic panel  Type 2 diabetes mellitus without complication, without long-term current use of insulin (HCC)  Class 2 severe obesity with serious comorbidity and body mass index (BMI) of 39.0 to 39.9 in adult, unspecified obesity type ([place])  PLAN:  Diabetes II [name] [name] has been given extensive diabetes education by myself today including ideal fasting and post-prandial blood glucose readings, individual ideal Hgb A1c goals and hypoglycemia prevention. We discussed the importance of good blood sugar control to decrease the likelihood of diabetic complications such as nephropathy, neuropathy, limb loss, blindness,  coronary artery disease, and death. We discussed the importance of intensive lifestyle modification including diet, exercise and weight loss as the first line treatment for diabetes. [name] [name] agrees to continue metformin and the Category 2 meal plan and follow up at the agreed upon time.  Pituitary Adenoma We will check CMP today for [name] [name]'s upcoming MRI.  Obesity [name] [name] is currently in the action stage of change. As such, her goal is to continue with weight loss efforts She has agreed to follow the Category 2 plan [name] [name] will continue classes and biking for 30 minutes 3 times per week for weight loss and overall health benefits. We discussed the following Behavioral Modification Strategies today: better snacking choices, planning for success and work on meal planning and easy cooking plans  [name] [name] has agreed to follow up with our clinic in 2 weeks. She was informed of the importance of frequent follow up visits to maximize her success with intensive lifestyle modifications for her multiple health conditions.  ALLERGIES: Allergies  Allergen Reactions  . Ambien [Zolpidem] Other (See Comments)    NIGHT TERRORS  . Atorvastatin Other (See Comments)    Muscle weakness Muscle Aches  . Codeine Nausea And Vomiting  . Penicillins Itching and Other (See Comments)    Made nose run uncontrollably Has patient had a PCN reaction causing immediate rash, facial/tongue/throat swelling, SOB or lightheadedness with hypotension: No Has patient had a PCN reaction causing severe rash involving mucus membranes or skin necrosis: No Has patient had a PCN reaction that required hospitalization No Has patient had a PCN reaction  occurring within the last 10 years: No If all of the above answers are "NO", then may proceed with Cephalosporin use.  . Simvastatin Other (See Comments)    Muscle weakness  . Byetta 10 Mcg Pen [Exenatide] Nausea Only    MEDICATIONS: Current Outpatient Medications on File Prior to  Visit  Medication Sig Dispense Refill  . beta carotene w/minerals (OCUVITE) tablet Take 1 tablet by mouth daily.    . Biotin (BIOTIN 5000) 5 MG CAPS Take by mouth.    . Calcium-Vitamin D-Vitamin K (VIACTIV PO) Take by mouth.    . Cholecalciferol (VITAMIN D3 PO) Take by mouth.    . Coenzyme Q10 (CO Q-10) 400 MG CAPS Take by mouth.    . gabapentin (NEURONTIN) 300 MG capsule Take 2 capsules (600 mg total) by mouth at bedtime. 180 capsule 4  . magnesium oxide (MAG-OX) 400 MG tablet Take 400 mg by mouth at bedtime.     . metFORMIN (GLUCOPHAGE) 500 MG tablet Take 1 tablet (500 mg total) by mouth daily with breakfast. 30 tablet 0  . OVER THE COUNTER MEDICATION P5P vitamin    . rosuvastatin (CRESTOR) 20 MG tablet Take 1 tablet (20 mg total) by mouth daily. (Patient taking differently: Take 20 mg by mouth at bedtime. ) 30 tablet 0  . XARELTO 20 MG TABS tablet Take 20 mg by mouth daily.     No current facility-administered medications on file prior to visit.     PAST MEDICAL HISTORY: Past Medical History:  Diagnosis Date  . Anemia   . Arthritis    "knees, hips, possibly back" (04/08/2016)  . Atrial septal aneurysm   . B12 deficiency    a. following gastric bypass.  . Back pain   . Chronic lower back pain    spondylosis  . Cold feet   . Daily headache    "short ones for the last month or 2" (04/08/2016)  . Depression   . Diabetes ([place])   . Dry mouth   . DVT (deep venous thrombosis) ([place])   . Easy bruising   . Floaters in visual field   . GERD (gastroesophageal reflux disease)    takes Protonix daily "just to protect my stomach; not for reflux" (04/08/2016)  . Hay fever   . Heart murmur    "dx'd by 1 anesthesiologist years and years ago"  . History of loop recorder 03/2016  . Hyperlipidemia   . Hypertension   . Joint pain   . Leg cramp   . Macular degeneration    "just watching"  . Neuropathy   . Nosebleed   . Palpitations   . Peripheral edema   . Peripheral neuropathy    not  on any meds  . Pituitary mass ([place])    a. s/p endoscopic surgery 02/2016.  [name] Pneumonia 12/2017  . Sleep apnea    no CPAP since weight loss after bariatric surgery '12 (04/08/2016)  . Spondylosis   . Stroke ([place]) 04/07/2016   a. 01/2016 and 03/2016. during admission had + LE DVT.  [name] Swelling of extremity   . TIA (transient ischemic attack)   . Trouble in sleeping   . Urinary incontinence   . Weakness     PAST SURGICAL HISTORY: Past Surgical History:  Procedure Laterality Date  . CATARACT EXTRACTION W/ INTRAOCULAR LENS  IMPLANT, BILATERAL Bilateral ~ 2012  . COLONOSCOPY    . EP IMPLANTABLE DEVICE N/A 04/10/2016   Procedure: Loop Recorder Insertion;  Surgeon: [employer],  MD;  Location: [facility];  Service: Cardiovascular;  Laterality: N/A;  . ESOPHAGOGASTRODUODENOSCOPY     in the 70's  . FACIAL COSMETIC SURGERY    . JOINT REPLACEMENT    . 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 SINUS ENDO W/FUSION  02/28/2016   Procedure: ENDOSCOPIC SINUS SURGERY WITH NAVIGATION;  Surgeon: Ruby Cola, MD;  Location: [facility];  Service: ENT;;  . TEE WITHOUT CARDIOVERSION N/A 04/10/2016   Procedure: TRANSESOPHAGEAL ECHOCARDIOGRAM (TEE) POSSIBLE LOOP;  Surgeon: Sanda Klein, MD;  Location: [facility];  Service: Cardiovascular;  Laterality: N/A;  . TONSILLECTOMY  1957  . TOTAL HIP ARTHROPLASTY Right 2008  . TOTAL HIP REVISION Right 03/30/2016   Procedure: RIGHT TOTAL HIP REVISION;  Surgeon: Frederik Pear, MD;  Location: [facility];  Service: Orthopedics;  Laterality: Right;  . TOTAL KNEE ARTHROPLASTY Bilateral 2001  . VAGINAL HYSTERECTOMY     "left my ovaries"    SOCIAL HISTORY: Social History   Tobacco Use  . Smoking status: Former Smoker    Packs/day: 1.00    Years: 40.00    Pack years: 40.00    Types: Cigarettes    Last attempt to quit: 08/2006    Years since quitting: 12.0  . Smokeless tobacco: Never Used  Substance Use Topics  . Alcohol use: Yes    Alcohol/week: 0.0 standard  drinks    Comment: 04/08/2016 "5-6 drinks/year'  . Drug use: No    Types: Marijuana    Comment: 04/08/2016 "did some marijuana in college"    FAMILY HISTORY: Family History  Problem Relation Age of Onset  . Neuropathy Mother   . Hypertension Mother   . Hyperlipidemia Mother   . Thyroid disease Mother   . Prostate cancer Father   . Lymphoma Father   . Hypertension Father   . Hyperlipidemia Father   . Cancer Father   . Obesity Father   . Congestive Heart Failure Maternal Grandmother   . Lung cancer Maternal Grandfather     ROS: Review of Systems  Constitutional: Positive for weight loss.  Cardiovascular: Negative for chest pain.  Gastrointestinal: Negative for diarrhea, nausea and vomiting.  Endo/Heme/Allergies:       Negative for hypoglycemia    PHYSICAL EXAM: Blood pressure 113/76, pulse 77, temperature 98.1 F (36.7 C), temperature source Oral, height _0  (1.6 m), weight 222 lb (100.7 kg), SpO2 98 %. Body mass index is 39.33 kg/m. Physical Exam Vitals signs reviewed.  Constitutional:      Appearance: Normal appearance. She is well-developed. She is obese.  Cardiovascular:     Rate and Rhythm: Normal rate.  Pulmonary:     Effort: Pulmonary effort is normal.  Musculoskeletal: Normal range of motion.  Skin:    General: Skin is warm and dry.  Neurological:     Mental Status: She is alert and oriented to person, place, and time.  Psychiatric:        Mood and Affect: Mood normal.        Behavior: Behavior normal.     RECENT LABS AND TESTS: BMET    Component Value Date/Time   NA 140 08/02/2018 0844   NA 139 04/29/2017 0944   K 4.1 08/02/2018 0844   K 3.9 04/29/2017 0944   CL 98 08/02/2018 0844   CO2 25 08/02/2018 0844   CO2 29 04/29/2017 0944   GLUCOSE 109 (H) 08/02/2018 0844   GLUCOSE 120 04/29/2017 0944   BUN 17 08/02/2018 0844   BUN 15.8 04/29/2017  0944   CREATININE 0.91 08/02/2018 0844   CREATININE 0.8 04/29/2017 0944   CALCIUM 9.5 08/02/2018  0844   CALCIUM 9.2 04/29/2017 0944   GFRNONAA 65 08/02/2018 0844   GFRAA 76 08/02/2018 0844   Lab Results  Component Value Date   HGBA1C 5.4 08/02/2018   HGBA1C 6.9 (H) 04/27/2018   HGBA1C 6.0 (H) 04/09/2016   HGBA1C 6.2 (H) 01/31/2016   HGBA1C (H) 03/15/2010    7.7 (NOTE)                                                                       According to the ADA Clinical Practice Recommendations for 2011, when HbA1c is used as a screening test:   >=6.5%   Diagnostic of Diabetes Mellitus           (if abnormal result  is confirmed)  5.7-6.4%   Increased risk of developing Diabetes Mellitus  References:Diagnosis and Classification of Diabetes Mellitus,Diabetes FFMB,8466,59(DJTTS 1):S62-S69 and Standards of Medical Care in         Diabetes - 2011,Diabetes Care,2011,34  (Suppl 1):S11-S61.   Lab Results  Component Value Date   INSULIN 6.9 08/02/2018   INSULIN 7.0 04/27/2018   CBC    Component Value Date/Time   WBC 7.7 08/02/2018 0844   WBC 7.9 04/29/2017 0944   WBC 16.0 (H) 04/13/2016 1105   RBC 4.82 08/02/2018 0844   RBC 4.53 04/29/2017 0944   RBC 3.65 (L) 04/13/2016 1105   HGB 11.5 08/02/2018 0844   HGB 11.9 04/29/2017 0944   HCT 37.1 08/02/2018 0856   HCT 36.9 04/29/2017 0944   PLT 275 04/27/2018 0000   MCV 76 (L) 08/02/2018 0844   MCV 81.5 04/29/2017 0944   MCH 23.9 (L) 08/02/2018 0844   MCH 26.3 04/29/2017 0944   MCH 26.3 04/13/2016 1105   MCHC 31.4 (L) 08/02/2018 0844   MCHC 32.2 04/29/2017 0944   MCHC 31.5 04/13/2016 1105   RDW 14.7 08/02/2018 0844   RDW 14.2 04/29/2017 0944   LYMPHSABS 1.3 08/02/2018 0844   LYMPHSABS 1.7 04/29/2017 0944   MONOABS 0.6 04/29/2017 0944   EOSABS 0.1 08/02/2018 0844   BASOSABS 0.1 08/02/2018 0844   BASOSABS 0.0 04/29/2017 0944   Iron/TIBC/Ferritin/ %Sat    Component Value Date/Time   IRON 37 08/02/2018 0856   IRON 51 04/29/2017 0944   TIBC 343 08/02/2018 0856   TIBC 343 04/29/2017 0944   FERRITIN 31 08/02/2018 0856    FERRITIN 23 04/29/2017 0944   IRONPCTSAT 11 (L) 08/02/2018 0856   IRONPCTSAT 15 (L) 04/29/2017 0944   IRONPCTSAT 12 (L) 05/29/2011 1436   Lipid Panel     Component Value Date/Time   CHOL 152 04/27/2018 0000   TRIG 144 04/27/2018 0000   HDL 60 04/27/2018 0000   CHOLHDL 2.7 04/09/2016 0155   VLDL 27 04/09/2016 0155   LDLCALC 63 04/27/2018 0000   Hepatic Function Panel     Component Value Date/Time   PROT 6.7 08/02/2018 0844   PROT 6.8 04/29/2017 0944   ALBUMIN 4.4 08/02/2018 0844   ALBUMIN 3.4 (L) 04/29/2017 0944   AST 17 08/02/2018 0844   AST 17 04/29/2017 0944   ALT 8 08/02/2018 0844   ALT 14 04/29/2017  0944   ALKPHOS 90 08/02/2018 0844   ALKPHOS 116 04/29/2017 0944   BILITOT 0.3 08/02/2018 0844   BILITOT 0.41 04/29/2017 0944   Lab Results  Component Value Date   TSH 2.000 04/27/2018    Results for LORIEANN, ARGUETA (MRN [id number]) as of 09/22/2018 11:24  Ref. Range 08/02/2018 08:44  Vitamin D, 25-Hydroxy Latest Ref Range: 30.0 - 100.0 ng/mL 72.2     OBESITY BEHAVIORAL INTERVENTION VISIT  Today's visit was # 11   Starting weight: 258 lbs Starting date: 04/27/2018 Today's weight : 222 lbs Today's date: 09/22/2018 Total lbs lost to date: 36 At least 15 minutes were spent on discussing the following behavioral intervention visit.   ASK: We discussed the diagnosis of obesity with [name] [name] today and [name] [name] agreed to give [place] permission to discuss obesity behavioral modification therapy today.  ASSESS: [name] [name] has the diagnosis of obesity and her BMI today is 39.34 Jaelle is in the action stage of change   ADVISE: [name] [name] was educated on the multiple health risks of obesity as well as the benefit of weight loss to improve her health. She was advised of the need for long term treatment and the importance of lifestyle modifications to improve her current health and to decrease her risk of future health problems.  AGREE: Multiple dietary modification options  and treatment options were discussed and  [name] [name] agreed to follow the recommendations documented in the above note.  ARRANGE: [name] [name] was educated on the importance of frequent visits to treat obesity as outlined per CMS and USPSTF guidelines and agreed to schedule her next follow up appointment today.  Corey Skains, am acting as Location manager for [name], FNP-C.  I have reviewed the above documentation for accuracy and completeness, and I agree with the above.  - Dawn Whitmire, FNP-C.

## 2018-09-23 LAB — COMPREHENSIVE METABOLIC PANEL
ALT: 14 IU/L (ref 0–32)
AST: 20 IU/L (ref 0–40)
Albumin/Globulin Ratio: 1.9 (ref 1.2–2.2)
Albumin: 4.4 g/dL (ref 3.6–4.8)
Alkaline Phosphatase: 92 IU/L (ref 39–117)
BUN/Creatinine Ratio: 24 (ref 12–28)
BUN: 19 mg/dL (ref 8–27)
Bilirubin Total: 0.3 mg/dL (ref 0.0–1.2)
CO2: 24 mmol/L (ref 20–29)
Calcium: 9.4 mg/dL (ref 8.7–10.3)
Chloride: 98 mmol/L (ref 96–106)
Creatinine, Ser: 0.78 mg/dL (ref 0.57–1.00)
GFR calc Af Amer: 91 mL/min/{1.73_m2} (ref >59)
GFR calc non Af Amer: 79 mL/min/{1.73_m2} (ref >59)
Globulin, Total: 2.3 g/dL (ref 1.5–4.5)
Glucose: 88 mg/dL (ref 65–99)
Potassium: 4.3 mmol/L (ref 3.5–5.2)
Sodium: 139 mmol/L (ref 134–144)
Total Protein: 6.7 g/dL (ref 6.0–8.5)

## 2018-09-26 ENCOUNTER — Encounter (INDEPENDENT_AMBULATORY_CARE_PROVIDER_SITE_OTHER): Payer: Self-pay | Admitting: Family Medicine

## 2018-09-28 ENCOUNTER — Ambulatory Visit (INDEPENDENT_AMBULATORY_CARE_PROVIDER_SITE_OTHER): Payer: Medicare Other

## 2018-09-28 DIAGNOSIS — I6340 Cerebral infarction due to embolism of unspecified cerebral artery: Secondary | ICD-10-CM

## 2018-09-28 DIAGNOSIS — D352 Benign neoplasm of pituitary gland: Secondary | ICD-10-CM | POA: Diagnosis not present

## 2018-09-29 LAB — CUP PACEART REMOTE DEVICE CHECK
Date Time Interrogation Session: 20200108234003
Implantable Pulse Generator Implant Date: 20170721

## 2018-09-29 NOTE — Progress Notes (Signed)
[facility] Summary Report / Loop Recorder

## 2018-10-06 ENCOUNTER — Ambulatory Visit (INDEPENDENT_AMBULATORY_CARE_PROVIDER_SITE_OTHER): Payer: Medicare Other | Admitting: Family Medicine

## 2018-10-06 VITALS — BP 113/72 | HR 77 | Temp 98.2°F | Ht 63.0 in | Wt 219.0 lb

## 2018-10-06 DIAGNOSIS — E119 Type 2 diabetes mellitus without complications: Secondary | ICD-10-CM | POA: Diagnosis not present

## 2018-10-06 DIAGNOSIS — Z6838 Body mass index (BMI) 38.0-38.9, adult: Secondary | ICD-10-CM

## 2018-10-07 NOTE — Progress Notes (Signed)
Office: [phone]  /  Fax: [phone]   HPI:   Chief Complaint: OBESITY [name] [name] is here to discuss her progress with her obesity treatment plan. She is on the Category 2 plan and is following her eating plan approximately 80% of the time. She states she is bike riding and taking exercise classes at the gym for 30-45 minutes 4 times per week. [name] [name] sometimes struggles with boredom and motivation, but she uses self-talk to talk herself through it. Her next weight goal is to get <213 which was her lowest weight after bariatric surgery.  Her weight is 219 lb (99.3 kg) today and has had a weight loss of 3 pounds over a period of 2 weeks since her last visit. She has lost 39 lbs since starting treatment with [place].  Diabetes II [name] [name] has a diagnosis of diabetes type II. [name] [name]'s diabetes is well controlled with metformin. She denies hypoglycemia, but notes occasional hunger. Last A1c was 5.4 on 08/02/18. She has been working on intensive lifestyle modifications including diet, exercise, and weight loss to help control her blood glucose levels.  ALLERGIES: Allergies  Allergen Reactions  . Ambien [Zolpidem] Other (See Comments)    NIGHT TERRORS  . Atorvastatin Other (See Comments)    Muscle weakness Muscle Aches  . Codeine Nausea And Vomiting  . Penicillins Itching and Other (See Comments)    Made nose run uncontrollably Has patient had a PCN reaction causing immediate rash, facial/tongue/throat swelling, SOB or lightheadedness with hypotension: No Has patient had a PCN reaction causing severe rash involving mucus membranes or skin necrosis: No Has patient had a PCN reaction that required hospitalization No Has patient had a PCN reaction occurring within the last 10 years: No If all of the above answers are "NO", then may proceed with Cephalosporin use.  . Simvastatin Other (See Comments)    Muscle weakness  . Byetta 10 Mcg Pen [Exenatide] Nausea Only    MEDICATIONS: Current  Outpatient Medications on File Prior to Visit  Medication Sig Dispense Refill  . beta carotene w/minerals (OCUVITE) tablet Take 1 tablet by mouth daily.    . Biotin (BIOTIN 5000) 5 MG CAPS Take by mouth.    . Calcium-Vitamin D-Vitamin K (VIACTIV PO) Take by mouth.    . Cholecalciferol (VITAMIN D3 PO) Take by mouth.    . Coenzyme Q10 (CO Q-10) 400 MG CAPS Take by mouth.    . gabapentin (NEURONTIN) 300 MG capsule Take 2 capsules (600 mg total) by mouth at bedtime. 180 capsule 4  . magnesium oxide (MAG-OX) 400 MG tablet Take 400 mg by mouth at bedtime.     . metFORMIN (GLUCOPHAGE) 500 MG tablet Take 1 tablet (500 mg total) by mouth daily with breakfast. 30 tablet 0  . OVER THE COUNTER MEDICATION P5P vitamin    . rosuvastatin (CRESTOR) 20 MG tablet Take 1 tablet (20 mg total) by mouth daily. (Patient taking differently: Take 20 mg by mouth at bedtime. ) 30 tablet 0  . XARELTO 20 MG TABS tablet Take 20 mg by mouth daily.     No current facility-administered medications on file prior to visit.     PAST MEDICAL HISTORY: Past Medical History:  Diagnosis Date  . Anemia   . Arthritis    "knees, hips, possibly back" (04/08/2016)  . Atrial septal aneurysm   . B12 deficiency    a. following gastric bypass.  . Back pain   . Chronic lower back pain    spondylosis  .  Cold feet   . Daily headache    "short ones for the last month or 2" (04/08/2016)  . Depression   . Diabetes ([place])   . Dry mouth   . DVT (deep venous thrombosis) ([place])   . Easy bruising   . Floaters in visual field   . GERD (gastroesophageal reflux disease)    takes Protonix daily "just to protect my stomach; not for reflux" (04/08/2016)  . Hay fever   . Heart murmur    "dx'd by 1 anesthesiologist years and years ago"  . History of loop recorder 03/2016  . Hyperlipidemia   . Hypertension   . Joint pain   . Leg cramp   . Macular degeneration    "just watching"  . Neuropathy   . Nosebleed   . Palpitations   . Peripheral  edema   . Peripheral neuropathy    not on any meds  . 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  [name] Pneumonia 12/2017  . Sleep apnea    no CPAP since weight loss after bariatric surgery '12 (04/08/2016)  . Spondylosis   . Stroke ([place]) 04/07/2016   a. 01/2016 and 03/2016. during admission had + LE DVT.  [name] Swelling of extremity   . TIA (transient ischemic attack)   . Trouble in sleeping   . Urinary incontinence   . Weakness     PAST SURGICAL HISTORY: Past Surgical History:  Procedure Laterality Date  . CATARACT EXTRACTION W/ INTRAOCULAR LENS  IMPLANT, BILATERAL Bilateral ~ 2012  . COLONOSCOPY    . EP IMPLANTABLE DEVICE N/A 04/10/2016   Procedure: Loop Recorder Insertion;  Surgeon: Will Meredith Leeds, MD;  Location: [facility];  Service: Cardiovascular;  Laterality: N/A;  . ESOPHAGOGASTRODUODENOSCOPY     in the 70's  . FACIAL COSMETIC SURGERY    . JOINT REPLACEMENT    . 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 SINUS ENDO W/FUSION  02/28/2016   Procedure: ENDOSCOPIC SINUS SURGERY WITH NAVIGATION;  Surgeon: Ruby Cola, MD;  Location: [facility];  Service: ENT;;  . TEE WITHOUT CARDIOVERSION N/A 04/10/2016   Procedure: TRANSESOPHAGEAL ECHOCARDIOGRAM (TEE) POSSIBLE LOOP;  Surgeon: Sanda Klein, MD;  Location: [facility];  Service: Cardiovascular;  Laterality: N/A;  . TONSILLECTOMY  1957  . TOTAL HIP ARTHROPLASTY Right 2008  . TOTAL HIP REVISION Right 03/30/2016   Procedure: RIGHT TOTAL HIP REVISION;  Surgeon: Frederik Pear, MD;  Location: [facility];  Service: Orthopedics;  Laterality: Right;  . TOTAL KNEE ARTHROPLASTY Bilateral 2001  . VAGINAL HYSTERECTOMY     "left my ovaries"    SOCIAL HISTORY: Social History   Tobacco Use  . Smoking status: Former Smoker    Packs/day: 1.00    Years: 40.00    Pack years: 40.00    Types: Cigarettes    Last attempt to quit: 08/2006    Years since quitting: 12.1  . Smokeless tobacco: Never Used  Substance Use Topics  . Alcohol  use: Yes    Alcohol/week: 0.0 standard drinks    Comment: 04/08/2016 "5-6 drinks/year'  . Drug use: No    Types: Marijuana    Comment: 04/08/2016 "did some marijuana in college"    FAMILY HISTORY: Family History  Problem Relation Age of Onset  . Neuropathy Mother   . Hypertension Mother   . Hyperlipidemia Mother   . Thyroid disease Mother   . Prostate cancer Father   . Lymphoma Father   . Hypertension Father   .  Hyperlipidemia Father   . Cancer Father   . Obesity Father   . Congestive Heart Failure Maternal Grandmother   . Lung cancer Maternal Grandfather     ROS: Review of Systems  Constitutional: Positive for weight loss.  Endo/Heme/Allergies:       Negative hypoglycemia    PHYSICAL EXAM: Blood pressure 113/72, pulse 77, temperature 98.2 F (36.8 C), temperature source Oral, height _0  (1.6 m), weight 219 lb (99.3 kg), SpO2 97 %. Body mass index is 38.79 kg/m. Physical Exam Vitals signs reviewed.  Constitutional:      Appearance: Normal appearance. She is obese.  Cardiovascular:     Rate and Rhythm: Normal rate.     Pulses: Normal pulses.  Pulmonary:     Effort: Pulmonary effort is normal.     Breath sounds: Normal breath sounds.  Musculoskeletal: Normal range of motion.  Skin:    General: Skin is warm and dry.  Neurological:     Mental Status: She is alert and oriented to person, place, and time.  Psychiatric:        Mood and Affect: Mood normal.        Behavior: Behavior normal.     RECENT LABS AND TESTS: BMET    Component Value Date/Time   NA 139 09/22/2018 1017   NA 139 04/29/2017 0944   K 4.3 09/22/2018 1017   K 3.9 04/29/2017 0944   CL 98 09/22/2018 1017   CO2 24 09/22/2018 1017   CO2 29 04/29/2017 0944   GLUCOSE 88 09/22/2018 1017   GLUCOSE 120 04/29/2017 0944   BUN 19 09/22/2018 1017   BUN 15.8 04/29/2017 0944   CREATININE 0.78 09/22/2018 1017   CREATININE 0.8 04/29/2017 0944   CALCIUM 9.4 09/22/2018 1017   CALCIUM 9.2 04/29/2017  0944   GFRNONAA 79 09/22/2018 1017   GFRAA 91 09/22/2018 1017   Lab Results  Component Value Date   HGBA1C 5.4 08/02/2018   HGBA1C 6.9 (H) 04/27/2018   HGBA1C 6.0 (H) 04/09/2016   HGBA1C 6.2 (H) 01/31/2016   HGBA1C (H) 03/15/2010    7.7 (NOTE)                                                                       According to the ADA Clinical Practice Recommendations for 2011, when HbA1c is used as a screening test:   >=6.5%   Diagnostic of Diabetes Mellitus           (if abnormal result  is confirmed)  5.7-6.4%   Increased risk of developing Diabetes Mellitus  References:Diagnosis and Classification of Diabetes Mellitus,Diabetes YYQM,2500,37(CWUGQ 1):S62-S69 and Standards of Medical Care in         Diabetes - 2011,Diabetes Care,2011,34  (Suppl 1):S11-S61.   Lab Results  Component Value Date   INSULIN 6.9 08/02/2018   INSULIN 7.0 04/27/2018   CBC    Component Value Date/Time   WBC 7.7 08/02/2018 0844   WBC 7.9 04/29/2017 0944   WBC 16.0 (H) 04/13/2016 1105   RBC 4.82 08/02/2018 0844   RBC 4.53 04/29/2017 0944   RBC 3.65 (L) 04/13/2016 1105   HGB 11.5 08/02/2018 0844   HGB 11.9 04/29/2017 0944   HCT 37.1 08/02/2018 0856   HCT  36.9 04/29/2017 0944   PLT 275 04/27/2018 0000   MCV 76 (L) 08/02/2018 0844   MCV 81.5 04/29/2017 0944   MCH 23.9 (L) 08/02/2018 0844   MCH 26.3 04/29/2017 0944   MCH 26.3 04/13/2016 1105   MCHC 31.4 (L) 08/02/2018 0844   MCHC 32.2 04/29/2017 0944   MCHC 31.5 04/13/2016 1105   RDW 14.7 08/02/2018 0844   RDW 14.2 04/29/2017 0944   LYMPHSABS 1.3 08/02/2018 0844   LYMPHSABS 1.7 04/29/2017 0944   MONOABS 0.6 04/29/2017 0944   EOSABS 0.1 08/02/2018 0844   BASOSABS 0.1 08/02/2018 0844   BASOSABS 0.0 04/29/2017 0944   Iron/TIBC/Ferritin/ %Sat    Component Value Date/Time   IRON 37 08/02/2018 0856   IRON 51 04/29/2017 0944   TIBC 343 08/02/2018 0856   TIBC 343 04/29/2017 0944   FERRITIN 31 08/02/2018 0856   FERRITIN 23 04/29/2017 0944    IRONPCTSAT 11 (L) 08/02/2018 0856   IRONPCTSAT 15 (L) 04/29/2017 0944   IRONPCTSAT 12 (L) 05/29/2011 1436   Lipid Panel     Component Value Date/Time   CHOL 152 04/27/2018 0000   TRIG 144 04/27/2018 0000   HDL 60 04/27/2018 0000   CHOLHDL 2.7 04/09/2016 0155   VLDL 27 04/09/2016 0155   LDLCALC 63 04/27/2018 0000   Hepatic Function Panel     Component Value Date/Time   PROT 6.7 09/22/2018 1017   PROT 6.8 04/29/2017 0944   ALBUMIN 4.4 09/22/2018 1017   ALBUMIN 3.4 (L) 04/29/2017 0944   AST 20 09/22/2018 1017   AST 17 04/29/2017 0944   ALT 14 09/22/2018 1017   ALT 14 04/29/2017 0944   ALKPHOS 92 09/22/2018 1017   ALKPHOS 116 04/29/2017 0944   BILITOT 0.3 09/22/2018 1017   BILITOT 0.41 04/29/2017 0944   Lab Results  Component Value Date   TSH 2.000 04/27/2018    ASSESSMENT AND PLAN: Type 2 diabetes mellitus without complication, without long-term current use of insulin (HCC)  Class 2 severe obesity with serious comorbidity and body mass index (BMI) of 38.0 to 38.9 in adult, unspecified obesity type ([place])  PLAN:  Diabetes II [name] [name] has been given extensive diabetes education by myself today including ideal fasting and post-prandial blood glucose readings, individual ideal Hgb A1c goals and hypoglycemia prevention. We discussed the importance of good blood sugar control to decrease the likelihood of diabetic complications such as nephropathy, neuropathy, limb loss, blindness, coronary artery disease, and death. We discussed the importance of intensive lifestyle modification including diet, exercise and weight loss as the first line treatment for diabetes. [name] [name] agrees to continue taking metformin and she will continue her meal plan. [name] [name] agrees to follow up with our clinic in 2 weeks.  Obesity [name] [name] is currently in the action stage of change. As such, her goal is to continue with weight loss efforts She has agreed to follow the Category 2 plan [name] [name] will continue  current exercise regimen for weight loss and overall health benefits. We discussed the following Behavioral Modification Strategies today: increasing lean protein intake, better snacking choices, and planning for success   [name] [name] has agreed to follow up with our clinic in 2 weeks. She was informed of the importance of frequent follow up visits to maximize her success with intensive lifestyle modifications for her multiple health conditions.   OBESITY BEHAVIORAL INTERVENTION VISIT  Today's visit was # 12  Starting weight: 258 lbs Starting date: 04/27/18 Today's weight : 219 lbs Today's date: 10/06/2018 Total lbs lost  to date: 73 At least 15 minutes were spent on discussing the following behavioral intervention visit.   ASK: We discussed the diagnosis of obesity with [name] [name] today and [name] [name] agreed to give [place] permission to discuss obesity behavioral modification therapy today.  ASSESS: [name] [name] has the diagnosis of obesity and her BMI today is 38.8 [name] [name] is in the action stage of change   ADVISE: [name] [name] was educated on the multiple health risks of obesity as well as the benefit of weight loss to improve her health. She was advised of the need for long term treatment and the importance of lifestyle modifications to improve her current health and to decrease her risk of future health problems.  AGREE: Multiple dietary modification options and treatment options were discussed and  [name] [name] agreed to follow the recommendations documented in the above note.  ARRANGE: [name] [name] was educated on the importance of frequent visits to treat obesity as outlined per CMS and USPSTF guidelines and agreed to schedule her next follow up appointment today.  [name], am acting as Location manager for [name], FNP-C.  I have reviewed the above documentation for accuracy and completeness, and I agree with the above.  - Khai Arrona, FNP-C.

## 2018-10-09 LAB — CUP PACEART REMOTE DEVICE CHECK
Date Time Interrogation Session: 20191206231059
Implantable Pulse Generator Implant Date: 20170721

## 2018-10-11 ENCOUNTER — Encounter (INDEPENDENT_AMBULATORY_CARE_PROVIDER_SITE_OTHER): Payer: Self-pay | Admitting: Family Medicine

## 2018-10-20 ENCOUNTER — Encounter (INDEPENDENT_AMBULATORY_CARE_PROVIDER_SITE_OTHER): Payer: Self-pay | Admitting: Family Medicine

## 2018-10-20 ENCOUNTER — Ambulatory Visit (INDEPENDENT_AMBULATORY_CARE_PROVIDER_SITE_OTHER): Payer: Medicare Other | Admitting: Family Medicine

## 2018-10-20 VITALS — BP 118/75 | HR 69 | Temp 97.8°F | Ht 63.0 in | Wt 216.0 lb

## 2018-10-20 DIAGNOSIS — E119 Type 2 diabetes mellitus without complications: Secondary | ICD-10-CM

## 2018-10-20 DIAGNOSIS — Z6838 Body mass index (BMI) 38.0-38.9, adult: Secondary | ICD-10-CM | POA: Diagnosis not present

## 2018-10-20 DIAGNOSIS — K5909 Other constipation: Secondary | ICD-10-CM | POA: Diagnosis not present

## 2018-10-20 MED ORDER — SEMAGLUTIDE 3 MG PO TABS: 3.0000 mg | ORAL_TABLET | Freq: Every day | ORAL | 0 refills | Status: DC

## 2018-10-22 NOTE — Progress Notes (Signed)
Office: [phone]  /  Fax: [phone]   HPI:   Chief Complaint: OBESITY [name] [name] is here to discuss her progress with her obesity treatment plan. She is on the Category 2 plan and is following her eating plan approximately 85% of the time. She states she is taking exercise classes, bike riding, and using machines for 60 minutes 5 times per week. [name] [name] notes rare cravings.  Her weight is 216 lb (98 kg) today and has had a weight loss of 3 pounds over a period of 2 weeks since her last visit. She has lost 42 lbs since starting treatment with [place].  Diabetes II [name] [name] has a diagnosis of diabetes type II. [name] [name] 's diabetes is well controlled on metformin. Last A1c was 5.4 on 08/02/18. She notes polyphagia and denies hypoglycemia. She has been working on intensive lifestyle modifications including diet, exercise, and weight loss to help control her blood glucose levels.  Constipation [name] [name] notes constipation. She states BM are infrequent and are hard and painful. She denies blood in stool. She admits to drinking less H20 recently.  ALLERGIES: Allergies  Allergen Reactions  . Ambien [Zolpidem] Other (See Comments)    NIGHT TERRORS  . Atorvastatin Other (See Comments)    Muscle weakness Muscle Aches  . Codeine Nausea And Vomiting  . Penicillins Itching and Other (See Comments)    Made nose run uncontrollably Has patient had a PCN reaction causing immediate rash, facial/tongue/throat swelling, SOB or lightheadedness with hypotension: No Has patient had a PCN reaction causing severe rash involving mucus membranes or skin necrosis: No Has patient had a PCN reaction that required hospitalization No Has patient had a PCN reaction occurring within the last 10 years: No If all of the above answers are "NO", then may proceed with Cephalosporin use.  . Simvastatin Other (See Comments)    Muscle weakness  . Byetta 10 Mcg Pen [Exenatide] Nausea Only    MEDICATIONS: Current Outpatient  Medications on File Prior to Visit  Medication Sig Dispense Refill  . beta carotene w/minerals (OCUVITE) tablet Take 1 tablet by mouth daily.    . Biotin (BIOTIN 5000) 5 MG CAPS Take by mouth.    . Calcium-Vitamin D-Vitamin K (VIACTIV PO) Take by mouth.    . Cholecalciferol (VITAMIN D3 PO) Take by mouth.    . Coenzyme Q10 (CO Q-10) 400 MG CAPS Take by mouth.    . gabapentin (NEURONTIN) 300 MG capsule Take 2 capsules (600 mg total) by mouth at bedtime. 180 capsule 4  . magnesium oxide (MAG-OX) 400 MG tablet Take 400 mg by mouth at bedtime.     [name] OVER THE COUNTER MEDICATION P5P vitamin    . rosuvastatin (CRESTOR) 20 MG tablet Take 1 tablet (20 mg total) by mouth daily. (Patient taking differently: Take 20 mg by mouth at bedtime. ) 30 tablet 0  . XARELTO 20 MG TABS tablet Take 20 mg by mouth daily.     No current facility-administered medications on file prior to visit.     PAST MEDICAL HISTORY: Past Medical History:  Diagnosis Date  . Anemia   . Arthritis    "knees, hips, possibly back" (04/08/2016)  . Atrial septal aneurysm   . B12 deficiency    a. following gastric bypass.  . Back pain   . Chronic lower back pain    spondylosis  . Cold feet   . Daily headache    "short ones for the last month or 2" (04/08/2016)  . Depression   .  Diabetes ([place])   . Dry mouth   . DVT (deep venous thrombosis) ([place])   . Easy bruising   . Floaters in visual field   . GERD (gastroesophageal reflux disease)    takes Protonix daily "just to protect my stomach; not for reflux" (04/08/2016)  . Hay fever   . Heart murmur    "dx'd by 1 anesthesiologist years and years ago"  . History of loop recorder 03/2016  . Hyperlipidemia   . Hypertension   . Joint pain   . Leg cramp   . Macular degeneration    "just watching"  . Neuropathy   . Nosebleed   . Palpitations   . Peripheral edema   . Peripheral neuropathy    not on any meds  . 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  [name]  Pneumonia 12/2017  . Sleep apnea    no CPAP since weight loss after bariatric surgery '12 (04/08/2016)  . Spondylosis   . Stroke ([place]) 04/07/2016   a. 01/2016 and 03/2016. during admission had + LE DVT.  [name] Swelling of extremity   . TIA (transient ischemic attack)   . Trouble in sleeping   . Urinary incontinence   . Weakness     PAST SURGICAL HISTORY: Past Surgical History:  Procedure Laterality Date  . CATARACT EXTRACTION W/ INTRAOCULAR LENS  IMPLANT, BILATERAL Bilateral ~ 2012  . COLONOSCOPY    . EP IMPLANTABLE DEVICE N/A 04/10/2016   Procedure: Loop Recorder Insertion;  Surgeon: Will Meredith Leeds, MD;  Location: [facility];  Service: Cardiovascular;  Laterality: N/A;  . ESOPHAGOGASTRODUODENOSCOPY     in the 70's  . FACIAL COSMETIC SURGERY    . JOINT REPLACEMENT    . 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 SINUS ENDO W/FUSION  02/28/2016   Procedure: ENDOSCOPIC SINUS SURGERY WITH NAVIGATION;  Surgeon: Ruby Cola, MD;  Location: [facility];  Service: ENT;;  . TEE WITHOUT CARDIOVERSION N/A 04/10/2016   Procedure: TRANSESOPHAGEAL ECHOCARDIOGRAM (TEE) POSSIBLE LOOP;  Surgeon: Sanda Klein, MD;  Location: [facility];  Service: Cardiovascular;  Laterality: N/A;  . TONSILLECTOMY  1957  . TOTAL HIP ARTHROPLASTY Right 2008  . TOTAL HIP REVISION Right 03/30/2016   Procedure: RIGHT TOTAL HIP REVISION;  Surgeon: Frederik Pear, MD;  Location: [facility];  Service: Orthopedics;  Laterality: Right;  . TOTAL KNEE ARTHROPLASTY Bilateral 2001  . VAGINAL HYSTERECTOMY     "left my ovaries"    SOCIAL HISTORY: Social History   Tobacco Use  . Smoking status: Former Smoker    Packs/day: 1.00    Years: 40.00    Pack years: 40.00    Types: Cigarettes    Last attempt to quit: 08/2006    Years since quitting: 12.1  . Smokeless tobacco: Never Used  Substance Use Topics  . Alcohol use: Yes    Alcohol/week: 0.0 standard drinks    Comment: 04/08/2016 "5-6 drinks/year'  . Drug use: No    Types:  Marijuana    Comment: 04/08/2016 "did some marijuana in college"    FAMILY HISTORY: Family History  Problem Relation Age of Onset  . Neuropathy Mother   . Hypertension Mother   . Hyperlipidemia Mother   . Thyroid disease Mother   . Prostate cancer Father   . Lymphoma Father   . Hypertension Father   . Hyperlipidemia Father   . Cancer Father   . Obesity Father   . Congestive Heart Failure Maternal Grandmother   .  Lung cancer Maternal Grandfather     ROS: Review of Systems  Constitutional: Positive for weight loss.  Gastrointestinal: Positive for constipation. Negative for blood in stool.  Endo/Heme/Allergies:       Positive polyphagia Negative hypoglycemia    PHYSICAL EXAM: Blood pressure 118/75, pulse 69, temperature 97.8 F (36.6 C), temperature source Oral, height 5' 3" (1.6 m), weight 216 lb (98 kg), SpO2 98 %. Body mass index is 38.26 kg/m. Physical Exam Vitals signs reviewed.  Constitutional:      Appearance: Normal appearance. She is obese.  Cardiovascular:     Rate and Rhythm: Normal rate.     Pulses: Normal pulses.  Pulmonary:     Effort: Pulmonary effort is normal.     Breath sounds: Normal breath sounds.  Musculoskeletal: Normal range of motion.  Skin:    General: Skin is warm and dry.  Neurological:     Mental Status: She is alert and oriented to person, place, and time.  Psychiatric:        Mood and Affect: Mood normal.        Behavior: Behavior normal.     RECENT LABS AND TESTS: BMET    Component Value Date/Time   NA 139 09/22/2018 1017   NA 139 04/29/2017 0944   K 4.3 09/22/2018 1017   K 3.9 04/29/2017 0944   CL 98 09/22/2018 1017   CO2 24 09/22/2018 1017   CO2 29 04/29/2017 0944   GLUCOSE 88 09/22/2018 1017   GLUCOSE 120 04/29/2017 0944   BUN 19 09/22/2018 1017   BUN 15.8 04/29/2017 0944   CREATININE 0.78 09/22/2018 1017   CREATININE 0.8 04/29/2017 0944   CALCIUM 9.4 09/22/2018 1017   CALCIUM 9.2 04/29/2017 0944   GFRNONAA 79  09/22/2018 1017   GFRAA 91 09/22/2018 1017   Lab Results  Component Value Date   HGBA1C 5.4 08/02/2018   HGBA1C 6.9 (H) 04/27/2018   HGBA1C 6.0 (H) 04/09/2016   HGBA1C 6.2 (H) 01/31/2016   HGBA1C (H) 03/15/2010    7.7 (NOTE)                                                                       According to the ADA Clinical Practice Recommendations for 2011, when HbA1c is used as a screening test:   >=6.5%   Diagnostic of Diabetes Mellitus           (if abnormal result  is confirmed)  5.7-6.4%   Increased risk of developing Diabetes Mellitus  References:Diagnosis and Classification of Diabetes Mellitus,Diabetes OBSJ,6283,66(QHUTM 1):S62-S69 and Standards of Medical Care in         Diabetes - 2011,Diabetes Care,2011,34  (Suppl 1):S11-S61.   Lab Results  Component Value Date   INSULIN 6.9 08/02/2018   INSULIN 7.0 04/27/2018   CBC    Component Value Date/Time   WBC 7.7 08/02/2018 0844   WBC 7.9 04/29/2017 0944   WBC 16.0 (H) 04/13/2016 1105   RBC 4.82 08/02/2018 0844   RBC 4.53 04/29/2017 0944   RBC 3.65 (L) 04/13/2016 1105   HGB 11.5 08/02/2018 0844   HGB 11.9 04/29/2017 0944   HCT 37.1 08/02/2018 0856   HCT 36.9 04/29/2017 0944   PLT 275 04/27/2018 0000  MCV 76 (L) 08/02/2018 0844   MCV 81.5 04/29/2017 0944   MCH 23.9 (L) 08/02/2018 0844   MCH 26.3 04/29/2017 0944   MCH 26.3 04/13/2016 1105   MCHC 31.4 (L) 08/02/2018 0844   MCHC 32.2 04/29/2017 0944   MCHC 31.5 04/13/2016 1105   RDW 14.7 08/02/2018 0844   RDW 14.2 04/29/2017 0944   LYMPHSABS 1.3 08/02/2018 0844   LYMPHSABS 1.7 04/29/2017 0944   MONOABS 0.6 04/29/2017 0944   EOSABS 0.1 08/02/2018 0844   BASOSABS 0.1 08/02/2018 0844   BASOSABS 0.0 04/29/2017 0944   Iron/TIBC/Ferritin/ %Sat    Component Value Date/Time   IRON 37 08/02/2018 0856   IRON 51 04/29/2017 0944   TIBC 343 08/02/2018 0856   TIBC 343 04/29/2017 0944   FERRITIN 31 08/02/2018 0856   FERRITIN 23 04/29/2017 0944   IRONPCTSAT 11 (L)  08/02/2018 0856   IRONPCTSAT 15 (L) 04/29/2017 0944   IRONPCTSAT 12 (L) 05/29/2011 1436   Lipid Panel     Component Value Date/Time   CHOL 152 04/27/2018 0000   TRIG 144 04/27/2018 0000   HDL 60 04/27/2018 0000   CHOLHDL 2.7 04/09/2016 0155   VLDL 27 04/09/2016 0155   LDLCALC 63 04/27/2018 0000   Hepatic Function Panel     Component Value Date/Time   PROT 6.7 09/22/2018 1017   PROT 6.8 04/29/2017 0944   ALBUMIN 4.4 09/22/2018 1017   ALBUMIN 3.4 (L) 04/29/2017 0944   AST 20 09/22/2018 1017   AST 17 04/29/2017 0944   ALT 14 09/22/2018 1017   ALT 14 04/29/2017 0944   ALKPHOS 92 09/22/2018 1017   ALKPHOS 116 04/29/2017 0944   BILITOT 0.3 09/22/2018 1017   BILITOT 0.41 04/29/2017 0944   Lab Results  Component Value Date   TSH 2.000 04/27/2018     ASSESSMENT AND PLAN: Type 2 diabetes mellitus without complication, without long-term current use of insulin (HCC) - Plan: Semaglutide (RYBELSUS) 3 MG TABS  Other constipation  Class 2 severe obesity with serious comorbidity and body mass index (BMI) of 38.0 to 38.9 in adult, unspecified obesity type ([place])  PLAN:  Diabetes II [name] [name] has been given extensive diabetes education by myself today including ideal fasting and post-prandial blood glucose readings, individual ideal Hgb A1c goals and hypoglycemia prevention. We discussed the importance of good blood sugar control to decrease the likelihood of diabetic complications such as nephropathy, neuropathy, limb loss, blindness, coronary artery disease, and death. We discussed the importance of intensive lifestyle modification including diet, exercise and weight loss as the first line treatment for diabetes. [name] [name] agrees to discontinue metformin and she agrees to start Rybelsus 3 mg PO daily #30 with no refills. [name] [name] agrees to follow up with our clinic in 2 weeks.  Constipation [name] [name] was informed decrease bowel movement frequency is normal while losing weight, but stools should  not be hard or painful. She was advised to increase her H20 intake and work on increasing her fiber intake. High fiber foods were discussed today. [name] [name] will take stool softener daily, and she agrees to follow up with our clinic in 2 weeks.  Obesity [name] [name] is currently in the action stage of change. As such, her goal is to continue with weight loss efforts She has agreed to follow the Category 2 plan [name] [name] will continue current exercise regimen for weight loss and overall health benefits. We discussed the following Behavioral Modification Strategies today: increasing fiber rich foods, increase H20 intake, and planning for success  [name] [name] has agreed to follow up with our clinic in 2 weeks. She was informed of the importance of frequent follow up visits to maximize her success with intensive lifestyle modifications for her multiple health conditions.   OBESITY BEHAVIORAL INTERVENTION VISIT  Today's visit was # 13  Starting weight: 258 lbs Starting date: 04/27/18 Today's weight : 216 lbs  Today's date: 10/20/2018 Total lbs lost to date: 42 At least 15 minutes were spent on discussing the following behavioral intervention visit.   ASK: We discussed the diagnosis of obesity with [name] [name] today and Vickye agreed to give [place] permission to discuss obesity behavioral modification therapy today.  ASSESS: [name] [name] has the diagnosis of obesity and her BMI today is 38.27 [name] [name] is in the action stage of change   ADVISE: [name] [name] was educated on the multiple health risks of obesity as well as the benefit of weight loss to improve her health. She was advised of the need for long term treatment and the importance of lifestyle modifications to improve her current health and to decrease her risk of future health problems.  AGREE: Multiple dietary modification options and treatment options were discussed and  [name] [name] agreed to follow the recommendations documented in the above  note.  ARRANGE: [name] [name] was educated on the importance of frequent visits to treat obesity as outlined per CMS and USPSTF guidelines and agreed to schedule her next follow up appointment today.  [name] [name], am acting as Location manager for [name], FNP-C.  I have reviewed the above documentation for accuracy and completeness, and I agree with the above.  - Kymoni Monday, FNP-C.

## 2018-10-24 ENCOUNTER — Encounter (INDEPENDENT_AMBULATORY_CARE_PROVIDER_SITE_OTHER): Payer: Self-pay | Admitting: Family Medicine

## 2018-10-24 DIAGNOSIS — K5909 Other constipation: Secondary | ICD-10-CM | POA: Insufficient documentation

## 2018-10-31 ENCOUNTER — Ambulatory Visit (INDEPENDENT_AMBULATORY_CARE_PROVIDER_SITE_OTHER): Payer: Medicare Other

## 2018-10-31 DIAGNOSIS — I6340 Cerebral infarction due to embolism of unspecified cerebral artery: Secondary | ICD-10-CM | POA: Diagnosis not present

## 2018-11-01 LAB — CUP PACEART REMOTE DEVICE CHECK
Date Time Interrogation Session: 20200210234053
Implantable Pulse Generator Implant Date: 20170721

## 2018-11-03 ENCOUNTER — Other Ambulatory Visit (INDEPENDENT_AMBULATORY_CARE_PROVIDER_SITE_OTHER): Payer: Self-pay | Admitting: Family Medicine

## 2018-11-03 ENCOUNTER — Encounter (INDEPENDENT_AMBULATORY_CARE_PROVIDER_SITE_OTHER): Payer: Self-pay | Admitting: Family Medicine

## 2018-11-03 ENCOUNTER — Ambulatory Visit (INDEPENDENT_AMBULATORY_CARE_PROVIDER_SITE_OTHER): Payer: Medicare Other | Admitting: Family Medicine

## 2018-11-03 VITALS — BP 121/77 | HR 70 | Temp 97.4°F | Ht 63.0 in | Wt 213.0 lb

## 2018-11-03 DIAGNOSIS — E559 Vitamin D deficiency, unspecified: Secondary | ICD-10-CM

## 2018-11-03 DIAGNOSIS — E639 Nutritional deficiency, unspecified: Secondary | ICD-10-CM | POA: Diagnosis not present

## 2018-11-03 DIAGNOSIS — Z6837 Body mass index (BMI) 37.0-37.9, adult: Secondary | ICD-10-CM | POA: Diagnosis not present

## 2018-11-03 DIAGNOSIS — K9089 Other intestinal malabsorption: Secondary | ICD-10-CM

## 2018-11-03 DIAGNOSIS — E7849 Other hyperlipidemia: Secondary | ICD-10-CM

## 2018-11-03 DIAGNOSIS — E119 Type 2 diabetes mellitus without complications: Secondary | ICD-10-CM | POA: Diagnosis not present

## 2018-11-03 DIAGNOSIS — K5909 Other constipation: Secondary | ICD-10-CM

## 2018-11-03 MED ORDER — METFORMIN HCL 500 MG PO TABS: 500.0000 mg | ORAL_TABLET | Freq: Every day | ORAL | 0 refills | Status: DC

## 2018-11-04 LAB — PREALBUMIN: PREALBUMIN: 14 mg/dL (ref 10–36)

## 2018-11-07 ENCOUNTER — Other Ambulatory Visit (INDEPENDENT_AMBULATORY_CARE_PROVIDER_SITE_OTHER): Payer: Self-pay | Admitting: Family Medicine

## 2018-11-07 ENCOUNTER — Encounter (INDEPENDENT_AMBULATORY_CARE_PROVIDER_SITE_OTHER): Payer: Self-pay | Admitting: Family Medicine

## 2018-11-07 DIAGNOSIS — E7849 Other hyperlipidemia: Secondary | ICD-10-CM | POA: Insufficient documentation

## 2018-11-07 DIAGNOSIS — E559 Vitamin D deficiency, unspecified: Secondary | ICD-10-CM | POA: Insufficient documentation

## 2018-11-07 DIAGNOSIS — K909 Intestinal malabsorption, unspecified: Secondary | ICD-10-CM | POA: Insufficient documentation

## 2018-11-07 DIAGNOSIS — E119 Type 2 diabetes mellitus without complications: Secondary | ICD-10-CM

## 2018-11-07 LAB — CBC WITH DIFFERENTIAL
Basophils Absolute: 0.1 10*3/uL (ref 0.0–0.2)
Basos: 1 %
EOS (ABSOLUTE): 1 10*3/uL — ABNORMAL HIGH (ref 0.0–0.4)
Eos: 10 %
Hemoglobin: 11.2 g/dL (ref 11.1–15.9)
Immature Grans (Abs): 0 10*3/uL (ref 0.0–0.1)
Immature Granulocytes: 0 %
Lymphocytes Absolute: 1.8 10*3/uL (ref 0.7–3.1)
Lymphs: 19 %
MCH: 23.3 pg — ABNORMAL LOW (ref 26.6–33.0)
MCHC: 31.6 g/dL (ref 31.5–35.7)
MCV: 74 fL — ABNORMAL LOW (ref 79–97)
Monocytes Absolute: 0.7 10*3/uL (ref 0.1–0.9)
Monocytes: 7 %
Neutrophils Absolute: 6.2 10*3/uL (ref 1.4–7.0)
Neutrophils: 63 %
RBC: 4.8 x10E6/uL (ref 3.77–5.28)
RDW: 15.3 % (ref 11.7–15.4)
WBC: 9.7 10*3/uL (ref 3.4–10.8)

## 2018-11-07 LAB — VITAMIN D 25 HYDROXY (VIT D DEFICIENCY, FRACTURES): Vit D, 25-Hydroxy: 57.8 ng/mL (ref 30.0–100.0)

## 2018-11-07 LAB — HEMOGLOBIN A1C
Est. average glucose Bld gHb Est-mCnc: 100 mg/dL
Hgb A1c MFr Bld: 5.1 % (ref 4.8–5.6)

## 2018-11-07 LAB — ANEMIA PANEL
Ferritin: 34 ng/mL (ref 15–150)
Folate, Hemolysate: 353 ng/mL
Folate, RBC: 997 ng/mL (ref >498)
Hematocrit: 35.4 % (ref 34.0–46.6)
Iron Saturation: 8 % — CL (ref 15–55)
Iron: 28 ug/dL (ref 27–139)
Retic Ct Pct: 1.2 % (ref 0.6–2.6)
Total Iron Binding Capacity: 335 ug/dL (ref 250–450)
UIBC: 307 ug/dL (ref 118–369)
Vitamin B-12: 1096 pg/mL (ref 232–1245)

## 2018-11-07 LAB — VITAMIN B1: Thiamine: 75.3 nmol/L (ref 66.5–200.0)

## 2018-11-07 LAB — LIPID PANEL WITH LDL/HDL RATIO
Cholesterol, Total: 152 mg/dL (ref 100–199)
HDL: 61 mg/dL (ref >39)
LDL Calculated: 69 mg/dL (ref 0–99)
LDl/HDL Ratio: 1.1 ratio (ref 0.0–3.2)
Triglycerides: 109 mg/dL (ref 0–149)
VLDL Cholesterol Cal: 22 mg/dL (ref 5–40)

## 2018-11-07 MED ORDER — METFORMIN HCL 500 MG PO TABS: 500.0000 mg | ORAL_TABLET | Freq: Every day | ORAL | 0 refills | Status: DC

## 2018-11-07 NOTE — Progress Notes (Signed)
Office: [phone]  /  Fax: [phone]   HPI:   Chief Complaint: OBESITY [name] [name] is here to discuss her progress with her obesity treatment plan. She is on the Category 2 plan and is following her eating plan approximately 75 % of the time. She states she is taking exercise classes, bike riding, and using machines for 60 minutes 5 times per week.  [name] [name] is doing well with plan. Her weight is 213 lb (96.6 kg) today and has had a weight loss of 3 pounds over a period of 2 weeks since her last visit. She has lost 45 lbs since starting treatment with [place].  Diabetes II [name] [name] has a diagnosis of diabetes type II. [name] [name] states Rybelsus does not fit into her schedule in the AM since she likes to drink coffee in the mornings, so she did not start it. She denies polyphagia or hypoglycemia episodes. Last A1c was Hemoglobin A1C Latest Ref Rng & Units 11/03/2018 08/02/2018 04/27/2018  HGBA1C 4.8 - 5.6 % 5.1 5.4 6.9(H)  Some recent data might be hidden    She has been working on intensive lifestyle modifications including diet, exercise, and weight loss to help control her blood glucose levels.  Constipation [name] [name] notes constipation for the last few weeks, worse since attempting weight loss. She states BM are less frequent and are hard and painful. She denies hematochezia or melena. She reports that constipation is no better with taking Miralax daily.  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   Hyperlipidemia [name] [name] has hyperlipidemia and has been trying to improve her cholesterol levels with intensive lifestyle modification including a low saturated fat diet, exercise and weight loss. She is on Crestor. She denies any chest pain, claudication or myalgias.  Vitamin D deficiency [name] [name] has a diagnosis of vitamin D deficiency. She  is currently taking OTC vit D3 and denies nausea, vomiting or muscle weakness.  ASSESSMENT AND PLAN:  Type 2 diabetes mellitus without complication, without long-term current use of insulin (HCC) - Plan: Hemoglobin A1c, Prealbumin, DISCONTINUED: metFORMIN (GLUCOPHAGE) 500 MG tablet  Other constipation - Plan: Prealbumin  Other specified intestinal malabsorption - Plan: CBC With Differential, Prealbumin, Anemia panel, Vitamin B1  Other hyperlipidemia - Plan: Lipid Panel With LDL/HDL Ratio, Prealbumin  Vitamin D deficiency - Plan: VITAMIN D 25 Hydroxy (Vit-D Deficiency, Fractures), Prealbumin  Class 2 severe obesity with serious comorbidity and body mass index (BMI) of 37.0 to 37.9 in adult, unspecified obesity type ([place])  PLAN: Diabetes II [name] [name] has been given extensive diabetes education by myself today including ideal fasting and post-prandial blood glucose readings, individual ideal Hgb A1c goals  and hypoglycemia prevention. We discussed the importance of good blood sugar control to decrease the likelihood of diabetic complications such as nephropathy, neuropathy, limb loss, blindness, coronary artery disease, and death. We discussed the importance of intensive lifestyle modification including diet, exercise and weight loss as the first line treatment for diabetes. We will check labs today. [name] [name] agrees to discontinue Rybelsus and restart Metformin 500 mg qam #30 with no refills and follow up with our clinic in 2-3 weeks.  Constipation [name] [name] was informed decrease bowel movement frequency is normal while losing weight, but stools should not be hard or painful. She was advised to increase her H20 intake and begin taking Bene fiber.  [name] [name] agrees to follow up with  our clinic in 2-3 weeks.  Intestinal malabsorption [name] [name] was informed that we will check labs today. [name] [name] agrees to follow up with our clinic in 2-3 weeks.  Hyperlipidemia [name] [name] was informed of the American Heart  Association Guidelines emphasizing intensive lifestyle modifications as the first line treatment for hyperlipidemia. We discussed many lifestyle modifications today in depth, and [name] [name] will continue to work on decreasing saturated fats such as fatty red meat, butter and many fried foods. She will also increase vegetables and lean protein in her diet and continue to work on exercise and weight loss efforts. Her last LDL was at goal. She is taking Crestor and denies chest pain or shortness of breath. We will check labs today. [name] [name] agrees to follow up with our clinic in 2-3 weeks.  Vitamin D Deficiency [name] [name] was informed that low vitamin D levels contributes to fatigue and are associated with obesity, breast, and colon cancer. She agrees to continue to take OTC Vit D and will follow up for routine testing of vitamin D, at least 2-3 times per year. She was informed of the risk of over-replacement of vitamin D and agrees to not increase her dose unless she discusses this with [place] first. She is at goal. She denies nausea, vomiting or muscle weakness. We will check labs today. [name] [name] agrees to follow up with our clinic in 2-3 weeks.  Obesity [name] [name] is currently in the action stage of change. As such, her goal is to continue with weight loss efforts She has agreed to follow the Category 2 plan [name] [name] has been instructed to continue her current exercise regimen for weight loss and overall health benefits. We discussed the following Behavioral Modification Strategies today: Planning for success  [name] [name] has agreed to follow up with our clinic in 2-3 weeks. She was informed of the importance of frequent follow up visits to maximize her success with intensive lifestyle modifications for her multiple health conditions.  ALLERGIES: Allergies  Allergen Reactions  . Ambien [Zolpidem] Other (See Comments)    NIGHT TERRORS  . Atorvastatin Other (See Comments)    Muscle weakness Muscle Aches  . Codeine Nausea  And Vomiting  . Penicillins Itching and Other (See Comments)    Made nose run uncontrollably Has patient had a PCN reaction causing immediate rash, facial/tongue/throat swelling, SOB or lightheadedness with hypotension: No Has patient had a PCN reaction causing severe rash involving mucus membranes or skin necrosis: No Has patient had a PCN reaction that required hospitalization No Has patient had a PCN reaction occurring within the last 10 years: No If all of the above answers are "NO", then may proceed with Cephalosporin use.  . Simvastatin Other (See Comments)    Muscle weakness  . Byetta 10 Mcg Pen [Exenatide] Nausea Only    MEDICATIONS: Current Outpatient Medications on File Prior to Visit  Medication Sig Dispense Refill  . beta carotene w/minerals (OCUVITE) tablet Take 1 tablet by mouth daily.    . Biotin (BIOTIN 5000) 5 MG CAPS Take by mouth.    . Calcium-Vitamin D-Vitamin K (VIACTIV PO) Take by mouth.    . Cholecalciferol (VITAMIN D3 PO) Take by mouth.    . Coenzyme Q10 (CO Q-10) 400 MG CAPS Take by mouth.    . gabapentin (NEURONTIN) 300 MG capsule Take 2 capsules (600 mg total) by mouth at bedtime. 180 capsule 4  . magnesium oxide (MAG-OX) 400 MG tablet Take 400 mg by mouth at bedtime.     [name] OVER THE COUNTER MEDICATION  P5P vitamin    . rosuvastatin (CRESTOR) 20 MG tablet Take 1 tablet (20 mg total) by mouth daily. (Patient taking differently: Take 20 mg by mouth at bedtime. ) 30 tablet 0  . Semaglutide (RYBELSUS) 3 MG TABS Take 3 mg by mouth daily. 30 tablet 0  . XARELTO 20 MG TABS tablet Take 20 mg by mouth daily.     No current facility-administered medications on file prior to visit.     PAST MEDICAL HISTORY: Past Medical History:  Diagnosis Date  . Anemia   . Arthritis    "knees, hips, possibly back" (04/08/2016)  . Atrial septal aneurysm   . B12 deficiency    a. following gastric bypass.  . Back pain   . Chronic lower back pain    spondylosis  . Cold feet   .  Daily headache    "short ones for the last month or 2" (04/08/2016)  . Depression   . Diabetes ([place])   . Dry mouth   . DVT (deep venous thrombosis) ([place])   . Easy bruising   . Floaters in visual field   . GERD (gastroesophageal reflux disease)    takes Protonix daily "just to protect my stomach; not for reflux" (04/08/2016)  . Hay fever   . Heart murmur    "dx'd by 1 anesthesiologist years and years ago"  . History of loop recorder 03/2016  . Hyperlipidemia   . Hypertension   . Joint pain   . Leg cramp   . Macular degeneration    "just watching"  . Neuropathy   . Nosebleed   . Palpitations   . Peripheral edema   . Peripheral neuropathy    not on any meds  . 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  [name] Pneumonia 12/2017  . Sleep apnea    no CPAP since weight loss after bariatric surgery '12 (04/08/2016)  . Spondylosis   . Stroke ([place]) 04/07/2016   a. 01/2016 and 03/2016. during admission had + LE DVT.  [name] Swelling of extremity   . TIA (transient ischemic attack)   . Trouble in sleeping   . Urinary incontinence   . Weakness     PAST SURGICAL HISTORY: Past Surgical History:  Procedure Laterality Date  . CATARACT EXTRACTION W/ INTRAOCULAR LENS  IMPLANT, BILATERAL Bilateral ~ 2012  . COLONOSCOPY    . EP IMPLANTABLE DEVICE N/A 04/10/2016   Procedure: Loop Recorder Insertion;  Surgeon: Will Meredith Leeds, MD;  Location: [facility];  Service: Cardiovascular;  Laterality: N/A;  . ESOPHAGOGASTRODUODENOSCOPY     in the 70's  . FACIAL COSMETIC SURGERY    . JOINT REPLACEMENT    . 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 SINUS ENDO W/FUSION  02/28/2016   Procedure: ENDOSCOPIC SINUS SURGERY WITH NAVIGATION;  Surgeon: Ruby Cola, MD;  Location: [facility];  Service: ENT;;  . TEE WITHOUT CARDIOVERSION N/A 04/10/2016   Procedure: TRANSESOPHAGEAL ECHOCARDIOGRAM (TEE) POSSIBLE LOOP;  Surgeon: Sanda Klein, MD;  Location: [facility];  Service: Cardiovascular;   Laterality: N/A;  . TONSILLECTOMY  1957  . TOTAL HIP ARTHROPLASTY Right 2008  . TOTAL HIP REVISION Right 03/30/2016   Procedure: RIGHT TOTAL HIP REVISION;  Surgeon: Frederik Pear, MD;  Location: [facility];  Service: Orthopedics;  Laterality: Right;  . TOTAL KNEE ARTHROPLASTY Bilateral 2001  . VAGINAL HYSTERECTOMY     "left my ovaries"    SOCIAL HISTORY: Social History   Tobacco Use  .  Smoking status: Former Smoker    Packs/day: 1.00    Years: 40.00    Pack years: 40.00    Types: Cigarettes    Last attempt to quit: 08/2006    Years since quitting: 12.2  . Smokeless tobacco: Never Used  Substance Use Topics  . Alcohol use: Yes    Alcohol/week: 0.0 standard drinks    Comment: 04/08/2016 "5-6 drinks/year'  . Drug use: No    Types: Marijuana    Comment: 04/08/2016 "did some marijuana in college"    FAMILY HISTORY: Family History  Problem Relation Age of Onset  . Neuropathy Mother   . Hypertension Mother   . Hyperlipidemia Mother   . Thyroid disease Mother   . Prostate cancer Father   . Lymphoma Father   . Hypertension Father   . Hyperlipidemia Father   . Cancer Father   . Obesity Father   . Congestive Heart Failure Maternal Grandmother   . Lung cancer Maternal Grandfather     ROS: Review of Systems  Constitutional: Positive for weight loss.  Respiratory: Negative for shortness of breath.   Cardiovascular: Negative for chest pain.  Gastrointestinal: Positive for constipation. Negative for diarrhea, nausea and vomiting.  Musculoskeletal:       Negative for muscle weakness  Endo/Heme/Allergies:       Negative for hypoglycemia Negative for Polyphagia    PHYSICAL EXAM: Blood pressure 121/77, pulse 70, temperature (!) 97.4 F (36.3 C), temperature source Oral, height _0  (1.6 m), weight 213 lb (96.6 kg), SpO2 100 %. Body mass index is 37.73 kg/m. Physical Exam Vitals signs reviewed.  Constitutional:      Appearance: Normal appearance. She is obese.  Cardiovascular:      Rate and Rhythm: Normal rate.     Pulses: Normal pulses.  Pulmonary:     Effort: Pulmonary effort is normal.  Musculoskeletal: Normal range of motion.  Skin:    General: Skin is warm and dry.  Neurological:     Mental Status: She is alert and oriented to person, place, and time.  Psychiatric:        Mood and Affect: Mood normal.        Behavior: Behavior normal.     RECENT LABS AND TESTS: BMET    Component Value Date/Time   NA 139 09/22/2018 1017   NA 139 04/29/2017 0944   K 4.3 09/22/2018 1017   K 3.9 04/29/2017 0944   CL 98 09/22/2018 1017   CO2 24 09/22/2018 1017   CO2 29 04/29/2017 0944   GLUCOSE 88 09/22/2018 1017   GLUCOSE 120 04/29/2017 0944   BUN 19 09/22/2018 1017   BUN 15.8 04/29/2017 0944   CREATININE 0.78 09/22/2018 1017   CREATININE 0.8 04/29/2017 0944   CALCIUM 9.4 09/22/2018 1017   CALCIUM 9.2 04/29/2017 0944   GFRNONAA 79 09/22/2018 1017   GFRAA 91 09/22/2018 1017   Lab Results  Component Value Date   HGBA1C 5.1 11/03/2018   HGBA1C 5.4 08/02/2018   HGBA1C 6.9 (H) 04/27/2018   HGBA1C 6.0 (H) 04/09/2016   HGBA1C 6.2 (H) 01/31/2016   Lab Results  Component Value Date   INSULIN 6.9 08/02/2018   INSULIN 7.0 04/27/2018   CBC    Component Value Date/Time   WBC 9.7 11/03/2018 1545   WBC 7.9 04/29/2017 0944   WBC 16.0 (H) 04/13/2016 1105   RBC 4.80 11/03/2018 1545   RBC 4.53 04/29/2017 0944   RBC 3.65 (L) 04/13/2016 1105   HGB 11.2  11/03/2018 1545   HGB 11.9 04/29/2017 0944   HCT 35.4 11/03/2018 1545   HCT 36.9 04/29/2017 0944   PLT 275 04/27/2018 0000   MCV 74 (L) 11/03/2018 1545   MCV 81.5 04/29/2017 0944   MCH 23.3 (L) 11/03/2018 1545   MCH 26.3 04/29/2017 0944   MCH 26.3 04/13/2016 1105   MCHC 31.6 11/03/2018 1545   MCHC 32.2 04/29/2017 0944   MCHC 31.5 04/13/2016 1105   RDW 15.3 11/03/2018 1545   RDW 14.2 04/29/2017 0944   LYMPHSABS 1.8 11/03/2018 1545   LYMPHSABS 1.7 04/29/2017 0944   MONOABS 0.6 04/29/2017 0944   EOSABS  1.0 (H) 11/03/2018 1545   BASOSABS 0.1 11/03/2018 1545   BASOSABS 0.0 04/29/2017 0944   Iron/TIBC/Ferritin/ %Sat    Component Value Date/Time   IRON 28 11/03/2018 1545   IRON 51 04/29/2017 0944   TIBC 335 11/03/2018 1545   TIBC 343 04/29/2017 0944   FERRITIN 34 11/03/2018 1545   FERRITIN 23 04/29/2017 0944   IRONPCTSAT 8 (LL) 11/03/2018 1545   IRONPCTSAT 15 (L) 04/29/2017 0944   IRONPCTSAT 12 (L) 05/29/2011 1436   Lipid Panel     Component Value Date/Time   CHOL 152 11/03/2018 1545   TRIG 109 11/03/2018 1545   HDL 61 11/03/2018 1545   CHOLHDL 2.7 04/09/2016 0155   VLDL 27 04/09/2016 0155   LDLCALC 69 11/03/2018 1545   Hepatic Function Panel     Component Value Date/Time   PROT 6.7 09/22/2018 1017   PROT 6.8 04/29/2017 0944   ALBUMIN 4.4 09/22/2018 1017   ALBUMIN 3.4 (L) 04/29/2017 0944   AST 20 09/22/2018 1017   AST 17 04/29/2017 0944   ALT 14 09/22/2018 1017   ALT 14 04/29/2017 0944   ALKPHOS 92 09/22/2018 1017   ALKPHOS 116 04/29/2017 0944   BILITOT 0.3 09/22/2018 1017   BILITOT 0.41 04/29/2017 0944   Lab Results  Component Value Date   TSH 2.000 04/27/2018      Ref. Range 08/02/2018 08:44  Vitamin D, 25-Hydroxy Latest Ref Range: 30.0 - 100.0 ng/mL 72.2     OBESITY BEHAVIORAL INTERVENTION VISIT  Today's visit was # 14   Starting weight: 258 lbs Starting date: 04/27/2018 Today's weight :213 lbs Today's date: 11/03/2018 Total lbs lost to date: 45 At least 15 minutes were spent on discussing the following behavioral intervention visit.  ASK: We discussed the diagnosis of obesity with Jefm Bryant today and Timisha agreed to give [place] permission to discuss obesity behavioral modification therapy today.  ASSESS: Emmabelle has the diagnosis of obesity and her BMI today is 37.74 Shari is in the action stage of change   ADVISE: Salena was educated on the multiple health risks of obesity as well as the benefit of weight loss to improve her health. She was  advised of the need for long term treatment and the importance of lifestyle modifications to improve her current health and to decrease her risk of future health problems.  AGREE: Multiple dietary modification options and treatment options were discussed and  Zachary agreed to follow the recommendations documented in the above note.  ARRANGE: Shanaya was educated on the importance of frequent visits to treat obesity as outlined per CMS and USPSTF guidelines and agreed to schedule her next follow up appointment today.  I, Tammy wysor, am acting as Location manager for [employer].  I have reviewed the above documentation for accuracy and completeness, and I agree with the above.  -   , FNP-C.

## 2018-11-08 ENCOUNTER — Encounter (INDEPENDENT_AMBULATORY_CARE_PROVIDER_SITE_OTHER): Payer: Self-pay | Admitting: Family Medicine

## 2018-11-09 ENCOUNTER — Encounter (INDEPENDENT_AMBULATORY_CARE_PROVIDER_SITE_OTHER): Payer: Self-pay | Admitting: Family Medicine

## 2018-11-11 NOTE — Progress Notes (Signed)
[facility] Summary Report / Loop Recorder

## 2018-11-17 ENCOUNTER — Encounter (INDEPENDENT_AMBULATORY_CARE_PROVIDER_SITE_OTHER): Payer: Self-pay | Admitting: Family Medicine

## 2018-11-17 ENCOUNTER — Ambulatory Visit (INDEPENDENT_AMBULATORY_CARE_PROVIDER_SITE_OTHER): Payer: Medicare Other | Admitting: Family Medicine

## 2018-11-17 VITALS — BP 120/76 | HR 75 | Temp 97.6°F | Ht 63.0 in | Wt 210.0 lb

## 2018-11-17 DIAGNOSIS — D508 Other iron deficiency anemias: Secondary | ICD-10-CM | POA: Diagnosis not present

## 2018-11-17 DIAGNOSIS — Z6837 Body mass index (BMI) 37.0-37.9, adult: Secondary | ICD-10-CM

## 2018-11-17 NOTE — Progress Notes (Signed)
[name] [name]

## 2018-11-17 NOTE — Progress Notes (Signed)
Office: [phone]  /  Fax: [phone]   HPI:   Chief Complaint: OBESITY [name] [name] is here to discuss her progress with her obesity treatment plan. She is on the Category 2 plan and is following her eating plan approximately 80% of the time. She states she is riding a bike and going to the gym 60-120 minutes 5 times per week. [name] is doing very well on the plan. She reports she is consistently exercising and sticking to the plan.  Her weight is 210 lb (95.3 kg) today and has had a weight loss of 3 pounds over a period of 2 weeks since her last visit. She has lost 48 lbs since starting treatment with [place].  Iron Deficiency Anemia [name] is taking 150 mg of iron daily and ferritin is improved. She denies fatigue.  ASSESSMENT AND PLAN:  Other iron deficiency anemia  Class 2 severe obesity with serious comorbidity and body mass index (BMI) of 37.0 to 37.9 in adult, unspecified obesity type ([place])  PLAN:  Iron Deficiency Anemia [name] was advised to continue taking her iron.  Obesity [name] is currently in the action stage of change. As such, her goal is to continue with weight loss efforts. She has agreed to follow the Category 2 plan. [name] has been instructed to continue her exercise regimen as above. We discussed the following Behavioral Modification Strategies today: planning for success.  [name] has agreed to follow-up with our clinic in 4 weeks. She was informed of the importance of frequent follow up visits to maximize her success with intensive lifestyle modifications for her multiple health conditions.  ALLERGIES: Allergies  Allergen Reactions  . Ambien [Zolpidem] Other (See Comments)    NIGHT TERRORS  . Atorvastatin Other (See Comments)    Muscle weakness Muscle Aches  . Codeine Nausea And Vomiting  . Penicillins Itching and Other (See Comments)    Made nose run uncontrollably Has patient had a PCN reaction causing immediate rash, facial/tongue/throat swelling,  SOB or lightheadedness with hypotension: No Has patient had a PCN reaction causing severe rash involving mucus membranes or skin necrosis: No Has patient had a PCN reaction that required hospitalization No Has patient had a PCN reaction occurring within the last 10 years: No If all of the above answers are "NO", then may proceed with Cephalosporin use.  . Simvastatin Other (See Comments)    Muscle weakness  . Byetta 10 Mcg Pen [Exenatide] Nausea Only    MEDICATIONS: Current Outpatient Medications on File Prior to Visit  Medication Sig Dispense Refill  . beta carotene w/minerals (OCUVITE) tablet Take 1 tablet by mouth daily.    . Calcium-Vitamin D-Vitamin K (VIACTIV PO) Take by mouth.    . Cholecalciferol (VITAMIN D3 PO) Take by mouth.    . Coenzyme Q10 (CO Q-10) 400 MG CAPS Take by mouth.    . gabapentin (NEURONTIN) 300 MG capsule Take 2 capsules (600 mg total) by mouth at bedtime. 180 capsule 4  . iron polysaccharides (NIFEREX) 150 MG capsule Take 150 mg by mouth daily.    . magnesium oxide (MAG-OX) 400 MG tablet Take 400 mg by mouth at bedtime.     . metFORMIN (GLUCOPHAGE) 500 MG tablet Take 1 tablet (500 mg total) by mouth daily with breakfast. 30 tablet 0  . OVER THE COUNTER MEDICATION P5P vitamin    . rosuvastatin (CRESTOR) 20 MG tablet Take 1 tablet (20 mg total) by mouth daily. (Patient taking differently: Take 20 mg by mouth at bedtime. ) 30  tablet 0  . XARELTO 20 MG TABS tablet Take 20 mg by mouth daily.     No current facility-administered medications on file prior to visit.     PAST MEDICAL HISTORY: Past Medical History:  Diagnosis Date  . Anemia   . Arthritis    "knees, hips, possibly back" (04/08/2016)  . Atrial septal aneurysm   . B12 deficiency    a. following gastric bypass.  . Back pain   . Chronic lower back pain    spondylosis  . Cold feet   . Daily headache    "short ones for the last month or 2" (04/08/2016)  . Depression   . Diabetes ([place])   . Dry  mouth   . DVT (deep venous thrombosis) ([place])   . Easy bruising   . Floaters in visual field   . GERD (gastroesophageal reflux disease)    takes Protonix daily "just to protect my stomach; not for reflux" (04/08/2016)  . Hay fever   . Heart murmur    "dx'd by 1 anesthesiologist years and years ago"  . History of loop recorder 03/2016  . Hyperlipidemia   . Hypertension   . Joint pain   . Leg cramp   . Macular degeneration    "just watching"  . Neuropathy   . Nosebleed   . Palpitations   . Peripheral edema   . Peripheral neuropathy    not on any meds  . 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  [name] Pneumonia 12/2017  . Sleep apnea    no CPAP since weight loss after bariatric surgery '12 (04/08/2016)  . Spondylosis   . Stroke ([place]) 04/07/2016   a. 01/2016 and 03/2016. during admission had + LE DVT.  [name] Swelling of extremity   . TIA (transient ischemic attack)   . Trouble in sleeping   . Urinary incontinence   . Weakness     PAST SURGICAL HISTORY: Past Surgical History:  Procedure Laterality Date  . CATARACT EXTRACTION W/ INTRAOCULAR LENS  IMPLANT, BILATERAL Bilateral ~ 2012  . COLONOSCOPY    . EP IMPLANTABLE DEVICE N/A 04/10/2016   Procedure: Loop Recorder Insertion;  Surgeon: Will Meredith Leeds, MD;  Location: [facility];  Service: Cardiovascular;  Laterality: N/A;  . ESOPHAGOGASTRODUODENOSCOPY     in the 70's  . FACIAL COSMETIC SURGERY    . JOINT REPLACEMENT    . 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 SINUS ENDO W/FUSION  02/28/2016   Procedure: ENDOSCOPIC SINUS SURGERY WITH NAVIGATION;  Surgeon: Ruby Cola, MD;  Location: [facility];  Service: ENT;;  . TEE WITHOUT CARDIOVERSION N/A 04/10/2016   Procedure: TRANSESOPHAGEAL ECHOCARDIOGRAM (TEE) POSSIBLE LOOP;  Surgeon: Sanda Klein, MD;  Location: [facility];  Service: Cardiovascular;  Laterality: N/A;  . TONSILLECTOMY  1957  . TOTAL HIP ARTHROPLASTY Right 2008  . TOTAL HIP REVISION Right 03/30/2016    Procedure: RIGHT TOTAL HIP REVISION;  Surgeon: Frederik Pear, MD;  Location: [facility];  Service: Orthopedics;  Laterality: Right;  . TOTAL KNEE ARTHROPLASTY Bilateral 2001  . VAGINAL HYSTERECTOMY     "left my ovaries"    SOCIAL HISTORY: Social History   Tobacco Use  . Smoking status: Former Smoker    Packs/day: 1.00    Years: 40.00    Pack years: 40.00    Types: Cigarettes    Last attempt to quit: 08/2006    Years since quitting: 12.2  . Smokeless tobacco: Never Used  Substance  Use Topics  . Alcohol use: Yes    Alcohol/week: 0.0 standard drinks    Comment: 04/08/2016 "5-6 drinks/year'  . Drug use: No    Types: Marijuana    Comment: 04/08/2016 "did some marijuana in college"    FAMILY HISTORY: Family History  Problem Relation Age of Onset  . Neuropathy Mother   . Hypertension Mother   . Hyperlipidemia Mother   . Thyroid disease Mother   . Prostate cancer Father   . Lymphoma Father   . Hypertension Father   . Hyperlipidemia Father   . Cancer Father   . Obesity Father   . Congestive Heart Failure Maternal Grandmother   . Lung cancer Maternal Grandfather    ROS: Review of Systems  Constitutional: Positive for weight loss. Negative for malaise/fatigue.   PHYSICAL EXAM: Blood pressure 120/76, pulse 75, temperature 97.6 F (36.4 C), height _0  (1.6 m), weight 210 lb (95.3 kg), SpO2 98 %. Body mass index is 37.2 kg/m. Physical Exam Vitals signs reviewed.  Constitutional:      Appearance: Normal appearance. She is obese.  Cardiovascular:     Rate and Rhythm: Normal rate.     Pulses: Normal pulses.  Pulmonary:     Effort: Pulmonary effort is normal.     Breath sounds: Normal breath sounds.  Musculoskeletal: Normal range of motion.  Skin:    General: Skin is warm and dry.  Neurological:     Mental Status: She is alert and oriented to person, place, and time.  Psychiatric:        Behavior: Behavior normal.   RECENT LABS AND TESTS: BMET    Component Value  Date/Time   NA 139 09/22/2018 1017   NA 139 04/29/2017 0944   K 4.3 09/22/2018 1017   K 3.9 04/29/2017 0944   CL 98 09/22/2018 1017   CO2 24 09/22/2018 1017   CO2 29 04/29/2017 0944   GLUCOSE 88 09/22/2018 1017   GLUCOSE 120 04/29/2017 0944   BUN 19 09/22/2018 1017   BUN 15.8 04/29/2017 0944   CREATININE 0.78 09/22/2018 1017   CREATININE 0.8 04/29/2017 0944   CALCIUM 9.4 09/22/2018 1017   CALCIUM 9.2 04/29/2017 0944   GFRNONAA 79 09/22/2018 1017   GFRAA 91 09/22/2018 1017   Lab Results  Component Value Date   HGBA1C 5.1 11/03/2018   HGBA1C 5.4 08/02/2018   HGBA1C 6.9 (H) 04/27/2018   HGBA1C 6.0 (H) 04/09/2016   HGBA1C 6.2 (H) 01/31/2016   Lab Results  Component Value Date   INSULIN 6.9 08/02/2018   INSULIN 7.0 04/27/2018   CBC    Component Value Date/Time   WBC 9.7 11/03/2018 1545   WBC 7.9 04/29/2017 0944   WBC 16.0 (H) 04/13/2016 1105   RBC 4.80 11/03/2018 1545   RBC 4.53 04/29/2017 0944   RBC 3.65 (L) 04/13/2016 1105   HGB 11.2 11/03/2018 1545   HGB 11.9 04/29/2017 0944   HCT 35.4 11/03/2018 1545   HCT 36.9 04/29/2017 0944   PLT 275 04/27/2018 0000   MCV 74 (L) 11/03/2018 1545   MCV 81.5 04/29/2017 0944   MCH 23.3 (L) 11/03/2018 1545   MCH 26.3 04/29/2017 0944   MCH 26.3 04/13/2016 1105   MCHC 31.6 11/03/2018 1545   MCHC 32.2 04/29/2017 0944   MCHC 31.5 04/13/2016 1105   RDW 15.3 11/03/2018 1545   RDW 14.2 04/29/2017 0944   LYMPHSABS 1.8 11/03/2018 1545   LYMPHSABS 1.7 04/29/2017 0944   MONOABS 0.6 04/29/2017 0944  EOSABS 1.0 (H) 11/03/2018 1545   BASOSABS 0.1 11/03/2018 1545   BASOSABS 0.0 04/29/2017 0944   Iron/TIBC/Ferritin/ %Sat    Component Value Date/Time   IRON 28 11/03/2018 1545   IRON 51 04/29/2017 0944   TIBC 335 11/03/2018 1545   TIBC 343 04/29/2017 0944   FERRITIN 34 11/03/2018 1545   FERRITIN 23 04/29/2017 0944   IRONPCTSAT 8 (LL) 11/03/2018 1545   IRONPCTSAT 15 (L) 04/29/2017 0944   IRONPCTSAT 12 (L) 05/29/2011 1436   Lipid  Panel     Component Value Date/Time   CHOL 152 11/03/2018 1545   TRIG 109 11/03/2018 1545   HDL 61 11/03/2018 1545   CHOLHDL 2.7 04/09/2016 0155   VLDL 27 04/09/2016 0155   LDLCALC 69 11/03/2018 1545   Hepatic Function Panel     Component Value Date/Time   PROT 6.7 09/22/2018 1017   PROT 6.8 04/29/2017 0944   ALBUMIN 4.4 09/22/2018 1017   ALBUMIN 3.4 (L) 04/29/2017 0944   AST 20 09/22/2018 1017   AST 17 04/29/2017 0944   ALT 14 09/22/2018 1017   ALT 14 04/29/2017 0944   ALKPHOS 92 09/22/2018 1017   ALKPHOS 116 04/29/2017 0944   BILITOT 0.3 09/22/2018 1017   BILITOT 0.41 04/29/2017 0944   Lab Results  Component Value Date   TSH 2.000 04/27/2018     Ref. Range 11/03/2018 15:45  Vitamin D, 25-Hydroxy Latest Ref Range: 30.0 - 100.0 ng/mL 57.8   OBESITY BEHAVIORAL INTERVENTION VISIT  Today's visit was #15  Starting weight: 258 lbs Starting date: 04/27/2018 Today's weight: 210 lbs Today's date: 11/17/2018 Total lbs lost to date: 66 At least 15 minutes were spent on discussing the following behavioral intervention visit.    11/17/2018  Height 5' 3" (1.6 m)  Weight 210 lb (95.3 kg)  BMI (Calculated) 37.21  BLOOD PRESSURE - SYSTOLIC 340  BLOOD PRESSURE - DIASTOLIC 76   Body Fat % 51 %   ASK: We discussed the diagnosis of obesity with Jefm Bryant today and Jozlin agreed to give [place] permission to discuss obesity behavioral modification therapy today.  ASSESS: Nyela has the diagnosis of obesity and her BMI today is 37.21. Cletis is in the action stage of change.   ADVISE: Ulla was educated on the multiple health risks of obesity as well as the benefit of weight loss to improve her health. She was advised of the need for long term treatment and the importance of lifestyle modifications to improve her current health and to decrease her risk of future health problems.  AGREE: Multiple dietary modification options and treatment options were discussed and  Germaine  agreed to follow the recommendations documented in the above note.  ARRANGE: My was educated on the importance of frequent visits to treat obesity as outlined per CMS and USPSTF guidelines and agreed to schedule her next follow up appointment today.  IMichaelene Song, am acting as Location manager for [name], FNP-C.  I have reviewed the above documentation for accuracy and completeness, and I agree with the above.  - Shelvy Perazzo, FNP-C.

## 2018-11-21 ENCOUNTER — Encounter (INDEPENDENT_AMBULATORY_CARE_PROVIDER_SITE_OTHER): Payer: Self-pay | Admitting: Family Medicine

## 2018-11-28 ENCOUNTER — Encounter: Payer: Self-pay | Admitting: Podiatry

## 2018-11-28 ENCOUNTER — Ambulatory Visit (INDEPENDENT_AMBULATORY_CARE_PROVIDER_SITE_OTHER): Payer: Medicare Other

## 2018-11-28 ENCOUNTER — Ambulatory Visit (INDEPENDENT_AMBULATORY_CARE_PROVIDER_SITE_OTHER): Payer: Medicare Other | Admitting: Podiatry

## 2018-11-28 ENCOUNTER — Other Ambulatory Visit: Payer: Self-pay | Admitting: Podiatry

## 2018-11-28 VITALS — BP 135/72

## 2018-11-28 DIAGNOSIS — E1140 Type 2 diabetes mellitus with diabetic neuropathy, unspecified: Secondary | ICD-10-CM | POA: Diagnosis not present

## 2018-11-28 DIAGNOSIS — I6340 Cerebral infarction due to embolism of unspecified cerebral artery: Secondary | ICD-10-CM | POA: Diagnosis not present

## 2018-11-28 DIAGNOSIS — R2681 Unsteadiness on feet: Secondary | ICD-10-CM

## 2018-11-28 DIAGNOSIS — M779 Enthesopathy, unspecified: Secondary | ICD-10-CM

## 2018-11-28 DIAGNOSIS — M79671 Pain in right foot: Secondary | ICD-10-CM | POA: Diagnosis not present

## 2018-11-28 DIAGNOSIS — E1149 Type 2 diabetes mellitus with other diabetic neurological complication: Secondary | ICD-10-CM | POA: Diagnosis not present

## 2018-11-28 DIAGNOSIS — Q828 Other specified congenital malformations of skin: Secondary | ICD-10-CM | POA: Diagnosis not present

## 2018-11-28 DIAGNOSIS — M79672 Pain in left foot: Secondary | ICD-10-CM

## 2018-11-28 MED ORDER — TRIAMCINOLONE ACETONIDE 10 MG/ML IJ SUSP
10.0000 mg | Freq: Once | INTRAMUSCULAR | Status: AC
  Administered 2018-11-28: 10 mg

## 2018-11-30 NOTE — Progress Notes (Signed)
Subjective:   Patient ID: [name] [name], female   DOB: 68 y.o.   MRN: [id number]   HPI Patient presents stating she has had a lot of pain in the outside of her right foot she does have significant neuropathic-like discomfort and numbness and also is experiencing gait instability secondary to the neuropathy.  Patient has large lesions plantarly and is supposed to be active but it is difficult for her to do and patient does not smoke and is currently losing weight with a program   Review of Systems  All other systems reviewed and are negative.       Objective:  Physical Exam Vitals signs and nursing note reviewed.  Constitutional:      Appearance: She is well-developed.  Pulmonary:     Effort: Pulmonary effort is normal.  Musculoskeletal: Normal range of motion.  Skin:    General: Skin is warm.  Neurological:     Mental Status: She is alert.     Neurovascular status was found to be diminished especially sharp dull vibratory with range of motion moderately diminished subtalar joint.  Vascular was adequate with good digital perfusion and patient is noted to have severe keratotic lesion sub-1 sub-5 bilateral with inflammation fluid around the fifth MPJ right that is painful when pressed and makes walking difficult.  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  Today to try to reduce the inflammation fifth MPJ I did do a sterile prep and injected the capsule 3 mg Dexasone Kenalog 5 mg Xylocaine and then using sterile instrumentation I debrided all lesions on both feet with no iatrogenic bleeding.  Discussed possibility for orthotic or possible balance bracing in future and will reevaluate in several months and see how  quickly these recur which will start to give me an idea as to where this needs to go in the future  X-ray indicates that there is no indications of significant arthritic processes or stress fracture with moderate depression of the arch noted upon weightbearing views

## 2018-12-05 ENCOUNTER — Other Ambulatory Visit: Payer: Self-pay

## 2018-12-05 ENCOUNTER — Ambulatory Visit (INDEPENDENT_AMBULATORY_CARE_PROVIDER_SITE_OTHER): Payer: Medicare Other | Admitting: *Deleted

## 2018-12-05 ENCOUNTER — Other Ambulatory Visit (INDEPENDENT_AMBULATORY_CARE_PROVIDER_SITE_OTHER): Payer: Self-pay | Admitting: Family Medicine

## 2018-12-05 DIAGNOSIS — I6340 Cerebral infarction due to embolism of unspecified cerebral artery: Secondary | ICD-10-CM | POA: Diagnosis not present

## 2018-12-05 DIAGNOSIS — E119 Type 2 diabetes mellitus without complications: Secondary | ICD-10-CM

## 2018-12-05 MED ORDER — METFORMIN HCL 500 MG PO TABS: 500.0000 mg | ORAL_TABLET | Freq: Every day | ORAL | 0 refills | Status: DC

## 2018-12-06 LAB — CUP PACEART REMOTE DEVICE CHECK
Date Time Interrogation Session: 20200315001045
Implantable Pulse Generator Implant Date: 20170721

## 2018-12-13 NOTE — Progress Notes (Signed)
[facility] Summary Report / Loop Recorder

## 2018-12-15 ENCOUNTER — Ambulatory Visit (INDEPENDENT_AMBULATORY_CARE_PROVIDER_SITE_OTHER): Payer: Medicare Other | Admitting: Family Medicine

## 2018-12-15 ENCOUNTER — Encounter (INDEPENDENT_AMBULATORY_CARE_PROVIDER_SITE_OTHER): Payer: Self-pay

## 2018-12-15 ENCOUNTER — Encounter (INDEPENDENT_AMBULATORY_CARE_PROVIDER_SITE_OTHER): Payer: Self-pay | Admitting: Family Medicine

## 2018-12-15 ENCOUNTER — Other Ambulatory Visit: Payer: Self-pay

## 2018-12-15 DIAGNOSIS — E559 Vitamin D deficiency, unspecified: Secondary | ICD-10-CM | POA: Diagnosis not present

## 2018-12-15 DIAGNOSIS — E119 Type 2 diabetes mellitus without complications: Secondary | ICD-10-CM

## 2018-12-15 DIAGNOSIS — Z6837 Body mass index (BMI) 37.0-37.9, adult: Secondary | ICD-10-CM

## 2018-12-15 MED ORDER — METFORMIN HCL 500 MG PO TABS: 500.0000 mg | ORAL_TABLET | Freq: Every day | ORAL | 0 refills | Status: DC

## 2018-12-16 ENCOUNTER — Encounter (INDEPENDENT_AMBULATORY_CARE_PROVIDER_SITE_OTHER): Payer: Self-pay | Admitting: Family Medicine

## 2018-12-19 ENCOUNTER — Encounter (INDEPENDENT_AMBULATORY_CARE_PROVIDER_SITE_OTHER): Payer: Self-pay

## 2018-12-19 NOTE — Progress Notes (Addendum)
Office: [phone]  /  Fax: [phone] TeleHealth Visit:  [name] [name] has consented to this TeleHealth visit today via [place]. The patient is located at home, the provider is located at the [employer] [facility] office. The participants in this visit include the listed provider and patient.  HPI:   Chief Complaint: OBESITY [name] [name] is here to discuss her progress with her obesity treatment plan. She is on the Category 2 plan and is following her eating plan approximately 75% of the time. She states she is going to the gym 1-2 hours 5 times per week. [name] [name] reports she weighed 207.2 lbs today and states her scale weighs 3 lbs heavier than our clinic scales We were unable to weight the patient today for this TeleHealth visit.She feels as if she has lost weight since her last visit. She has lost 48 lbs since starting treatment with [place].  Diabetes Mellitus [name] [name] has a diagnosis of diabetes mellitus, which is well controlled on metformin. [name] [name] states she does not check her CBG's. Last A1c was reported at 5.1 on 11/03/2018. She has been working on intensive lifestyle modifications including diet, exercise, and weight loss to help control her blood glucose levels. Lab Results  Component Value Date   HGBA1C 5.1 11/03/2018    Vitamin D deficiency [name] [name] has a diagnosis of Vitamin D deficiency, and her level is currently at goal. She is currently taking OTC Vit D and denies nausea, vomiting or muscle weakness.  ASSESSMENT AND PLAN:  Type 2 diabetes mellitus without complication, without long-term current use of insulin ([place]) - Plan: metFORMIN (GLUCOPHAGE) 500 MG tablet  Vitamin D deficiency  Class 2 severe obesity with serious comorbidity and body mass index (BMI) of 37.0 to 37.9 in adult, unspecified obesity type ([place])  PLAN:  Diabetes Mellitus [name] [name] has been given extensive diabetes education by myself today including ideal fasting and post-prandial blood glucose readings,  individual ideal Hgb A1c goals  and hypoglycemia prevention. We discussed the importance of good blood sugar control to decrease the likelihood of diabetic complications such as nephropathy, neuropathy, limb loss, blindness, coronary artery disease, and death. We discussed the importance of intensive lifestyle modification including diet, exercise and weight loss as the first line treatment for diabetes. [name] [name] was given a refill on her metformin 500 mg qam #90 with 0 refills and agrees to follow-up with our clinic in 2 weeks.  Vitamin D Deficiency [name] [name] was informed that low Vitamin D levels contributes to fatigue and are associated with obesity, breast, and colon cancer. She agrees to continue to take OTC Vit D and will follow-up for routine testing of Vitamin D, at least 2-3 times per year. She was informed of the risk of over-replacement of Vitamin D and agrees to not increase her dose unless she discusses this with [place] first. [name] [name] agrees to follow-up with our clinic in 2 weeks.  Obesity [name] [name] is currently in the action stage of change. As such, her goal is to continue with weight loss efforts. She has agreed to follow the Category 2 plan. [name] [name] has been instructed to weigh on days of visits and record. [name] [name] will do exercise videos 5 days per week. We discussed the following Behavioral Modification Strategies today: decrease liquid calories and keep a strict food journal.  [name] [name] has agreed to follow-up with our clinic in 2 weeks. She was informed of the importance of frequent follow-up visits to maximize her success with intensive lifestyle modifications for her multiple health conditions.  ALLERGIES: Allergies  Allergen Reactions  . Ambien [Zolpidem] Other (See Comments)    NIGHT TERRORS  . Atorvastatin Other (See Comments)    Muscle weakness Muscle Aches  . Codeine Nausea And Vomiting  . Penicillins Itching and Other (See Comments)    Made nose run uncontrollably Has patient  had a PCN reaction causing immediate rash, facial/tongue/throat swelling, SOB or lightheadedness with hypotension: No Has patient had a PCN reaction causing severe rash involving mucus membranes or skin necrosis: No Has patient had a PCN reaction that required hospitalization No Has patient had a PCN reaction occurring within the last 10 years: No If all of the above answers are "NO", then may proceed with Cephalosporin use.  . Simvastatin Other (See Comments)    Muscle weakness  . Byetta 10 Mcg Pen [Exenatide] Nausea Only    MEDICATIONS: Current Outpatient Medications on File Prior to Visit  Medication Sig Dispense Refill  . beta carotene w/minerals (OCUVITE) tablet Take 1 tablet by mouth daily.    . Calcium-Vitamin D-Vitamin K (VIACTIV PO) Take by mouth.    . Cholecalciferol (VITAMIN D3 PO) Take by mouth.    . Coenzyme Q10 (CO Q-10) 400 MG CAPS Take by mouth.    . gabapentin (NEURONTIN) 300 MG capsule Take 2 capsules (600 mg total) by mouth at bedtime. 180 capsule 4  . iron polysaccharides (NIFEREX) 150 MG capsule Take 150 mg by mouth daily.    . magnesium oxide (MAG-OX) 400 MG tablet Take 400 mg by mouth at bedtime.     [name] OVER THE COUNTER MEDICATION P5P vitamin    . rosuvastatin (CRESTOR) 20 MG tablet Take 1 tablet (20 mg total) by mouth daily. (Patient taking differently: Take 20 mg by mouth at bedtime. ) 30 tablet 0  . XARELTO 20 MG TABS tablet Take 20 mg by mouth daily.     No current facility-administered medications on file prior to visit.     PAST MEDICAL HISTORY: Past Medical History:  Diagnosis Date  . Anemia   . Arthritis    "knees, hips, possibly back" (04/08/2016)  . Atrial septal aneurysm   . B12 deficiency    a. following gastric bypass.  . Back pain   . Chronic lower back pain    spondylosis  . Cold feet   . Daily headache    "short ones for the last month or 2" (04/08/2016)  . Depression   . Diabetes ([place])   . Dry mouth   . DVT (deep venous thrombosis)  ([place])   . Easy bruising   . Floaters in visual field   . GERD (gastroesophageal reflux disease)    takes Protonix daily "just to protect my stomach; not for reflux" (04/08/2016)  . Hay fever   . Heart murmur    "dx'd by 1 anesthesiologist years and years ago"  . History of loop recorder 03/2016  . Hyperlipidemia   . Hypertension   . Joint pain   . Leg cramp   . Macular degeneration    "just watching"  . Neuropathy   . Nosebleed   . Palpitations   . Peripheral edema   . Peripheral neuropathy    not on any meds  . 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  [name] Pneumonia 12/2017  . Sleep apnea    no CPAP since weight loss after bariatric surgery '12 (04/08/2016)  . Spondylosis   . Stroke ([place]) 04/07/2016   a. 01/2016 and 03/2016. during  admission had + LE DVT.  [name] Swelling of extremity   . TIA (transient ischemic attack)   . Trouble in sleeping   . Urinary incontinence   . Weakness     PAST SURGICAL HISTORY: Past Surgical History:  Procedure Laterality Date  . CATARACT EXTRACTION W/ INTRAOCULAR LENS  IMPLANT, BILATERAL Bilateral ~ 2012  . COLONOSCOPY    . EP IMPLANTABLE DEVICE N/A 04/10/2016   Procedure: Loop Recorder Insertion;  Surgeon: Will Meredith Leeds, MD;  Location: [facility];  Service: Cardiovascular;  Laterality: N/A;  . ESOPHAGOGASTRODUODENOSCOPY     in the 70's  . FACIAL COSMETIC SURGERY    . JOINT REPLACEMENT    . 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 SINUS ENDO W/FUSION  02/28/2016   Procedure: ENDOSCOPIC SINUS SURGERY WITH NAVIGATION;  Surgeon: Ruby Cola, MD;  Location: [facility];  Service: ENT;;  . TEE WITHOUT CARDIOVERSION N/A 04/10/2016   Procedure: TRANSESOPHAGEAL ECHOCARDIOGRAM (TEE) POSSIBLE LOOP;  Surgeon: Sanda Klein, MD;  Location: [facility];  Service: Cardiovascular;  Laterality: N/A;  . TONSILLECTOMY  1957  . TOTAL HIP ARTHROPLASTY Right 2008  . TOTAL HIP REVISION Right 03/30/2016   Procedure: RIGHT TOTAL HIP REVISION;   Surgeon: Frederik Pear, MD;  Location: [facility];  Service: Orthopedics;  Laterality: Right;  . TOTAL KNEE ARTHROPLASTY Bilateral 2001  . VAGINAL HYSTERECTOMY     "left my ovaries"    SOCIAL HISTORY: Social History   Tobacco Use  . Smoking status: Former Smoker    Packs/day: 1.00    Years: 40.00    Pack years: 40.00    Types: Cigarettes    Last attempt to quit: 08/2006    Years since quitting: 12.3  . Smokeless tobacco: Never Used  Substance Use Topics  . Alcohol use: Yes    Alcohol/week: 0.0 standard drinks    Comment: 04/08/2016 "5-6 drinks/year'  . Drug use: No    Types: Marijuana    Comment: 04/08/2016 "did some marijuana in college"    FAMILY HISTORY: Family History  Problem Relation Age of Onset  . Neuropathy Mother   . Hypertension Mother   . Hyperlipidemia Mother   . Thyroid disease Mother   . Prostate cancer Father   . Lymphoma Father   . Hypertension Father   . Hyperlipidemia Father   . Cancer Father   . Obesity Father   . Congestive Heart Failure Maternal Grandmother   . Lung cancer Maternal Grandfather    ROS: Review of Systems  Gastrointestinal: Negative for nausea and vomiting.  Musculoskeletal:       Negative for muscle weakness.   PHYSICAL EXAM: Pt in no acute distress  RECENT LABS AND TESTS: BMET    Component Value Date/Time   NA 139 09/22/2018 1017   NA 139 04/29/2017 0944   K 4.3 09/22/2018 1017   K 3.9 04/29/2017 0944   CL 98 09/22/2018 1017   CO2 24 09/22/2018 1017   CO2 29 04/29/2017 0944   GLUCOSE 88 09/22/2018 1017   GLUCOSE 120 04/29/2017 0944   BUN 19 09/22/2018 1017   BUN 15.8 04/29/2017 0944   CREATININE 0.78 09/22/2018 1017   CREATININE 0.8 04/29/2017 0944   CALCIUM 9.4 09/22/2018 1017   CALCIUM 9.2 04/29/2017 0944   GFRNONAA 79 09/22/2018 1017   GFRAA 91 09/22/2018 1017   Lab Results  Component Value Date   HGBA1C 5.1 11/03/2018   HGBA1C 5.4 08/02/2018   HGBA1C 6.9 (H) 04/27/2018   HGBA1C 6.0 (  H) 04/09/2016   HGBA1C  6.2 (H) 01/31/2016   Lab Results  Component Value Date   INSULIN 6.9 08/02/2018   INSULIN 7.0 04/27/2018   CBC    Component Value Date/Time   WBC 9.7 11/03/2018 1545   WBC 7.9 04/29/2017 0944   WBC 16.0 (H) 04/13/2016 1105   RBC 4.80 11/03/2018 1545   RBC 4.53 04/29/2017 0944   RBC 3.65 (L) 04/13/2016 1105   HGB 11.2 11/03/2018 1545   HGB 11.9 04/29/2017 0944   HCT 35.4 11/03/2018 1545   HCT 36.9 04/29/2017 0944   PLT 275 04/27/2018 0000   MCV 74 (L) 11/03/2018 1545   MCV 81.5 04/29/2017 0944   MCH 23.3 (L) 11/03/2018 1545   MCH 26.3 04/29/2017 0944   MCH 26.3 04/13/2016 1105   MCHC 31.6 11/03/2018 1545   MCHC 32.2 04/29/2017 0944   MCHC 31.5 04/13/2016 1105   RDW 15.3 11/03/2018 1545   RDW 14.2 04/29/2017 0944   LYMPHSABS 1.8 11/03/2018 1545   LYMPHSABS 1.7 04/29/2017 0944   MONOABS 0.6 04/29/2017 0944   EOSABS 1.0 (H) 11/03/2018 1545   BASOSABS 0.1 11/03/2018 1545   BASOSABS 0.0 04/29/2017 0944   Iron/TIBC/Ferritin/ %Sat    Component Value Date/Time   IRON 28 11/03/2018 1545   IRON 51 04/29/2017 0944   TIBC 335 11/03/2018 1545   TIBC 343 04/29/2017 0944   FERRITIN 34 11/03/2018 1545   FERRITIN 23 04/29/2017 0944   IRONPCTSAT 8 (LL) 11/03/2018 1545   IRONPCTSAT 15 (L) 04/29/2017 0944   IRONPCTSAT 12 (L) 05/29/2011 1436   Lipid Panel     Component Value Date/Time   CHOL 152 11/03/2018 1545   TRIG 109 11/03/2018 1545   HDL 61 11/03/2018 1545   CHOLHDL 2.7 04/09/2016 0155   VLDL 27 04/09/2016 0155   LDLCALC 69 11/03/2018 1545   Hepatic Function Panel     Component Value Date/Time   PROT 6.7 09/22/2018 1017   PROT 6.8 04/29/2017 0944   ALBUMIN 4.4 09/22/2018 1017   ALBUMIN 3.4 (L) 04/29/2017 0944   AST 20 09/22/2018 1017   AST 17 04/29/2017 0944   ALT 14 09/22/2018 1017   ALT 14 04/29/2017 0944   ALKPHOS 92 09/22/2018 1017   ALKPHOS 116 04/29/2017 0944   BILITOT 0.3 09/22/2018 1017   BILITOT 0.41 04/29/2017 0944   Lab Results  Component Value  Date   TSH 2.000 04/27/2018    Results for BYRDIE, MIYAZAKI (MRN [id number]) as of 12/19/2018 09:24  Ref. Range 11/03/2018 15:45  Vitamin D, 25-Hydroxy Latest Ref Range: 30.0 - 100.0 ng/mL 57.8    I, Michaelene Song, am acting as Location manager for [name], FNP-C.  I have reviewed the above documentation for accuracy and completeness, and I agree with the above.  - Izaiha Lo, FNP-C.

## 2018-12-20 ENCOUNTER — Encounter (INDEPENDENT_AMBULATORY_CARE_PROVIDER_SITE_OTHER): Payer: Self-pay | Admitting: Family Medicine

## 2018-12-28 ENCOUNTER — Ambulatory Visit (INDEPENDENT_AMBULATORY_CARE_PROVIDER_SITE_OTHER): Payer: Medicare Other | Admitting: Family Medicine

## 2018-12-28 ENCOUNTER — Other Ambulatory Visit: Payer: Self-pay

## 2018-12-28 ENCOUNTER — Encounter (INDEPENDENT_AMBULATORY_CARE_PROVIDER_SITE_OTHER): Payer: Self-pay | Admitting: Family Medicine

## 2018-12-28 DIAGNOSIS — Z6837 Body mass index (BMI) 37.0-37.9, adult: Secondary | ICD-10-CM

## 2018-12-28 DIAGNOSIS — E119 Type 2 diabetes mellitus without complications: Secondary | ICD-10-CM | POA: Diagnosis not present

## 2018-12-28 NOTE — Progress Notes (Signed)
Office: [phone]  /  Fax: [phone] TeleHealth Visit:  [name] [name] has verbally consented to this TeleHealth visit today. The patient is located at home, the provider is located at the [employer] [facility] office. The participants in this visit include the listed provider and patient. The visit was conducted today via [facility].  HPI:   Chief Complaint: OBESITY [name] [name] is here to discuss her progress with her obesity treatment plan. She is on the Category 2 plan and is following her eating plan approximately 50% of the time. She states she is exercising via [employer] video 60 minutes 5 times per week. [name] [name] states she weighed 204.4 today reflecting a 2-3 lb weight loss. She has been cheating quite a bit, eating for comfort. She does feel she sticks to the plan 75% of the time. We were unable to weigh the patient today for this TeleHealth visit. She feels as if she has lost 2 lbs since her last visit. She has lost 48 lbs since starting treatment with [place].  Diabetes Mellitus [name] [name] has a diagnosis of diabetes mellitus, well controlled on metformin. [name] [name] does not check her CBG's. Last A1c was 5.1 on 11/03/2018. She has been working on intensive lifestyle modifications including diet, exercise, and weight loss to help control her blood glucose levels. She denies hypoglycemia.  ASSESSMENT AND PLAN:  Type 2 diabetes mellitus without complication, without long-term current use of insulin ([facility])  Class 2 severe obesity with serious comorbidity and body mass index (BMI) of 37.0 to 37.9 in adult, unspecified obesity type ([place])  PLAN:  Diabetes Mellitus [name] [name] has been given extensive diabetes education by myself today including ideal fasting and post-prandial blood glucose readings, individual ideal HgA1c goals  and hypoglycemia prevention. We discussed the importance of good blood sugar control to decrease the likelihood of diabetic complications such as nephropathy, neuropathy,  limb loss, blindness, coronary artery disease, and death. We discussed the importance of intensive lifestyle modification including diet, exercise and weight loss as the first line treatment for diabetes. [name] [name] agrees to continue her metformin and will follow-up at the agreed upon time.  I spent > than 50% of the 15 minute visit on counseling as documented in the note.  Obesity [name] [name] is currently in the action stage of change. As such, her goal is to continue with weight loss efforts. She has agreed to follow the Category 2 plan. [name] [name] has been instructed to continue her current regimen with the [employer].  We discussed the following Behavioral Modification Strategies today: decreasing simple carbohydrates and planning for success.  [name] [name] has agreed to follow-up with our clinic in 2 weeks. She was informed of the importance of frequent follow-up visits to maximize her success with intensive lifestyle modifications for her multiple health conditions.  ALLERGIES: Allergies  Allergen Reactions   Ambien [Zolpidem] Other (See Comments)    NIGHT TERRORS   Atorvastatin Other (See Comments)    Muscle weakness Muscle Aches   Codeine Nausea And Vomiting   Penicillins Itching and Other (See Comments)    Made nose run uncontrollably Has patient had a PCN reaction causing immediate rash, facial/tongue/throat swelling, SOB or lightheadedness with hypotension: No Has patient had a PCN reaction causing severe rash involving mucus membranes or skin necrosis: No Has patient had a PCN reaction that required hospitalization No Has patient had a PCN reaction occurring within the last 10 years: No If all of the above answers are "NO", then may proceed with Cephalosporin use.  Simvastatin Other (See Comments)    Muscle weakness   Byetta 10 Mcg Pen [Exenatide] Nausea Only    MEDICATIONS: Current Outpatient Medications on File Prior to Visit  Medication Sig Dispense Refill   beta  carotene w/minerals (OCUVITE) tablet Take 1 tablet by mouth daily.     Calcium-Vitamin D-Vitamin K (VIACTIV PO) Take by mouth.     Cholecalciferol (VITAMIN D3 PO) Take by mouth.     Coenzyme Q10 (CO Q-10) 400 MG CAPS Take by mouth.     gabapentin (NEURONTIN) 300 MG capsule Take 2 capsules (600 mg total) by mouth at bedtime. 180 capsule 4   iron polysaccharides (NIFEREX) 150 MG capsule Take 150 mg by mouth daily.     magnesium oxide (MAG-OX) 400 MG tablet Take 400 mg by mouth at bedtime.      metFORMIN (GLUCOPHAGE) 500 MG tablet Take 1 tablet (500 mg total) by mouth daily with breakfast. 90 tablet 0   OVER THE COUNTER MEDICATION P5P vitamin     rosuvastatin (CRESTOR) 20 MG tablet Take 1 tablet (20 mg total) by mouth daily. (Patient taking differently: Take 20 mg by mouth at bedtime. ) 30 tablet 0   XARELTO 20 MG TABS tablet Take 20 mg by mouth daily.     No current facility-administered medications on file prior to visit.     PAST MEDICAL HISTORY: Past Medical History:  Diagnosis Date   Anemia    Arthritis    "knees, hips, possibly back" (04/08/2016)   Atrial septal aneurysm    B12 deficiency    a. following gastric bypass.   Back pain    Chronic lower back pain    spondylosis   Cold feet    Daily headache    "short ones for the last month or 2" (04/08/2016)   Depression    Diabetes (HCC)    Dry mouth    DVT (deep venous thrombosis) ([facility])    Easy bruising    Floaters in visual field    GERD (gastroesophageal reflux disease)    takes Protonix daily "just to protect my stomach; not for reflux" (04/08/2016)   Hay fever    Heart murmur    "dx'd by 1 anesthesiologist years and years ago"   History of loop recorder 03/2016   Hyperlipidemia    Hypertension    Joint pain    Leg cramp    Macular degeneration    "just watching"   Neuropathy    Nosebleed    Palpitations    Peripheral edema    Peripheral neuropathy    not on any meds    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 s/p endoscopic surgery 02/2016.   Pneumonia 12/2017   Sleep apnea    no CPAP since weight loss after bariatric surgery '12 (04/08/2016)   Spondylosis    Stroke ([place]) 04/07/2016   a. 01/2016 and 03/2016. during admission had + LE DVT.   Swelling of extremity    TIA (transient ischemic attack)    Trouble in sleeping    Urinary incontinence    Weakness     PAST SURGICAL HISTORY: Past Surgical History:  Procedure Laterality Date   CATARACT EXTRACTION W/ INTRAOCULAR LENS  IMPLANT, BILATERAL Bilateral ~ 2012   COLONOSCOPY     EP IMPLANTABLE DEVICE N/A 04/10/2016   Procedure: Loop Recorder Insertion;  Surgeon: Will Meredith Leeds, MD;  Location: [facility];  Service: Cardiovascular;  Laterality: N/A;   ESOPHAGOGASTRODUODENOSCOPY  in the [place] GASTRIC BYPASS  11/24/10   SINUS ENDO W/FUSION  02/28/2016   Procedure: ENDOSCOPIC SINUS SURGERY WITH NAVIGATION;  Surgeon: Ruby Cola, MD;  Location: [facility];  Service: ENT;;   TEE WITHOUT CARDIOVERSION N/A 04/10/2016   Procedure: TRANSESOPHAGEAL ECHOCARDIOGRAM (TEE) POSSIBLE LOOP;  Surgeon: Sanda Klein, MD;  Location: [facility];  Service: Cardiovascular;  Laterality: N/A;   [place] Right 2008   TOTAL HIP REVISION Right 03/30/2016   Procedure: RIGHT TOTAL HIP REVISION;  Surgeon: Frederik Pear, MD;  Location: [facility];  Service: Orthopedics;  Laterality: Right;   TOTAL KNEE ARTHROPLASTY Bilateral 2001   VAGINAL HYSTERECTOMY     "left my ovaries"    SOCIAL HISTORY: Social History   Tobacco Use   Smoking status: Former Smoker    Packs/day: 1.00    Years: 40.00    Pack years: 40.00    Types: Cigarettes    Last attempt to quit: 08/2006    Years since quitting: 12.3   Smokeless tobacco: Never Used  Substance Use Topics   Alcohol use: Yes    Alcohol/week: 0.0 standard drinks    Comment:  04/08/2016 "5-6 drinks/year'   Drug use: No    Types: Marijuana    Comment: 04/08/2016 "did some marijuana in college"    FAMILY HISTORY: Family History  Problem Relation Age of Onset   Neuropathy Mother    Hypertension Mother    Hyperlipidemia Mother    Thyroid disease Mother    Prostate cancer Father    Lymphoma Father    Hypertension Father    Hyperlipidemia Father    Cancer Father    Obesity Father    Congestive Heart Failure Maternal Grandmother    Lung cancer Maternal Grandfather     ROS: Review of Systems  Endo/Heme/Allergies:       Negative for hypoglycemia.   PHYSICAL EXAM: Pt in no acute distress  RECENT LABS AND TESTS: BMET    Component Value Date/Time   NA 139 09/22/2018 1017   NA 139 04/29/2017 0944   K 4.3 09/22/2018 1017   K 3.9 04/29/2017 0944   CL 98 09/22/2018 1017   CO2 24 09/22/2018 1017   CO2 29 04/29/2017 0944   GLUCOSE 88 09/22/2018 1017   GLUCOSE 120 04/29/2017 0944   BUN 19 09/22/2018 1017   BUN 15.8 04/29/2017 0944   CREATININE 0.78 09/22/2018 1017   CREATININE 0.8 04/29/2017 0944   CALCIUM 9.4 09/22/2018 1017   CALCIUM 9.2 04/29/2017 0944   GFRNONAA 79 09/22/2018 1017   GFRAA 91 09/22/2018 1017   Lab Results  Component Value Date   HGBA1C 5.1 11/03/2018   HGBA1C 5.4 08/02/2018   HGBA1C 6.9 (H) 04/27/2018   HGBA1C 6.0 (H) 04/09/2016   HGBA1C 6.2 (H) 01/31/2016   Lab Results  Component Value Date   INSULIN 6.9 08/02/2018   INSULIN 7.0 04/27/2018   CBC    Component Value Date/Time   WBC 9.7 11/03/2018 1545   WBC 7.9 04/29/2017 0944   WBC 16.0 (H) 04/13/2016 1105   RBC 4.80 11/03/2018 1545   RBC 4.53 04/29/2017 0944   RBC 3.65 (L) 04/13/2016 1105   HGB 11.2 11/03/2018 1545   HGB 11.9 04/29/2017 0944   HCT 35.4 11/03/2018 1545   HCT 36.9 04/29/2017 0944   PLT 275 04/27/2018 0000   MCV 74 (L) 11/03/2018  1545   MCV 81.5 04/29/2017 0944   MCH 23.3 (L) 11/03/2018 1545   MCH 26.3 04/29/2017 0944   MCH 26.3  04/13/2016 1105   MCHC 31.6 11/03/2018 1545   MCHC 32.2 04/29/2017 0944   MCHC 31.5 04/13/2016 1105   RDW 15.3 11/03/2018 1545   RDW 14.2 04/29/2017 0944   LYMPHSABS 1.8 11/03/2018 1545   LYMPHSABS 1.7 04/29/2017 0944   MONOABS 0.6 04/29/2017 0944   EOSABS 1.0 (H) 11/03/2018 1545   BASOSABS 0.1 11/03/2018 1545   BASOSABS 0.0 04/29/2017 0944   Iron/TIBC/Ferritin/ %Sat    Component Value Date/Time   IRON 28 11/03/2018 1545   IRON 51 04/29/2017 0944   TIBC 335 11/03/2018 1545   TIBC 343 04/29/2017 0944   FERRITIN 34 11/03/2018 1545   FERRITIN 23 04/29/2017 0944   IRONPCTSAT 8 (LL) 11/03/2018 1545   IRONPCTSAT 15 (L) 04/29/2017 0944   IRONPCTSAT 12 (L) 05/29/2011 1436   Lipid Panel     Component Value Date/Time   CHOL 152 11/03/2018 1545   TRIG 109 11/03/2018 1545   HDL 61 11/03/2018 1545   CHOLHDL 2.7 04/09/2016 0155   VLDL 27 04/09/2016 0155   LDLCALC 69 11/03/2018 1545   Hepatic Function Panel     Component Value Date/Time   PROT 6.7 09/22/2018 1017   PROT 6.8 04/29/2017 0944   ALBUMIN 4.4 09/22/2018 1017   ALBUMIN 3.4 (L) 04/29/2017 0944   AST 20 09/22/2018 1017   AST 17 04/29/2017 0944   ALT 14 09/22/2018 1017   ALT 14 04/29/2017 0944   ALKPHOS 92 09/22/2018 1017   ALKPHOS 116 04/29/2017 0944   BILITOT 0.3 09/22/2018 1017   BILITOT 0.41 04/29/2017 0944   Lab Results  Component Value Date   TSH 2.000 04/27/2018     Results for [name] [name], [name] [name] (MRN [id number]) as of 12/28/2018 11:51  Ref. Range 11/03/2018 15:45  Vitamin D, 25-Hydroxy Latest Ref Range: 30.0 - 100.0 ng/mL 57.8    I, Michaelene Song, am acting as Location manager for [name], FNP-C.  I have reviewed the above documentation for accuracy and completeness, and I agree with the above.  - Keirsten Matuska, FNP-C.

## 2018-12-29 ENCOUNTER — Ambulatory Visit (INDEPENDENT_AMBULATORY_CARE_PROVIDER_SITE_OTHER): Payer: Medicare Other | Admitting: Family Medicine

## 2019-01-05 ENCOUNTER — Other Ambulatory Visit: Payer: Self-pay

## 2019-01-05 ENCOUNTER — Ambulatory Visit (INDEPENDENT_AMBULATORY_CARE_PROVIDER_SITE_OTHER): Payer: Medicare Other | Admitting: *Deleted

## 2019-01-05 DIAGNOSIS — I6340 Cerebral infarction due to embolism of unspecified cerebral artery: Secondary | ICD-10-CM

## 2019-01-06 LAB — CUP PACEART REMOTE DEVICE CHECK
Date Time Interrogation Session: 20200417004009
Implantable Pulse Generator Implant Date: 20170721

## 2019-01-11 NOTE — Progress Notes (Signed)
[facility] Summary Report / Loop Recorder

## 2019-01-12 ENCOUNTER — Encounter (INDEPENDENT_AMBULATORY_CARE_PROVIDER_SITE_OTHER): Payer: Self-pay | Admitting: Family Medicine

## 2019-01-12 ENCOUNTER — Other Ambulatory Visit: Payer: Self-pay

## 2019-01-12 ENCOUNTER — Ambulatory Visit (INDEPENDENT_AMBULATORY_CARE_PROVIDER_SITE_OTHER): Payer: Medicare Other | Admitting: Family Medicine

## 2019-01-12 DIAGNOSIS — E119 Type 2 diabetes mellitus without complications: Secondary | ICD-10-CM

## 2019-01-12 DIAGNOSIS — Z6837 Body mass index (BMI) 37.0-37.9, adult: Secondary | ICD-10-CM | POA: Diagnosis not present

## 2019-01-12 NOTE — Progress Notes (Signed)
Office: [phone]  /  Fax: [phone] TeleHealth Visit:  [name] [name] has verbally consented to this TeleHealth visit today. The patient is located at home, the provider is located at the [employer] [facility] office. The participants in this visit include the listed provider and patient. The visit was conducted today via FaceTime.  HPI:   Chief Complaint: OBESITY [name] [name] is here to discuss her progress with her obesity treatment plan. She is on the Category 2 plan and is following her eating plan approximately 50% of the time. She states she is doing [employer] 30-60 minutes 5-6 times per week. [name] [name] states she weighed 201.1 lbs today, which is down 3 lbs. She reports she has not been very compliant with the plan and has had several cheat days. She states she is monitoring her weight weekly to stay on track. We were unable to weigh the patient today for this TeleHealth visit. She feels as if she has lost 3 lbs since her last visit. She has lost 48 lbs since starting treatment with [place].  Diabetes Mellitus [name] [name] has a diagnosis of diabetes mellitus, well controlled on metformin. [name] [name] does not check her sugars and denies any hypoglycemic episodes. Last A1c was 5.1 on 11/03/2018. She has been working on intensive lifestyle modifications including diet, exercise, and weight loss to help control her blood glucose levels.  ASSESSMENT AND PLAN:  Type 2 diabetes mellitus without complication, without long-term current use of insulin (HCC)  Class 2 severe obesity with serious comorbidity and body mass index (BMI) of 37.0 to 37.9 in adult, unspecified obesity type ([place])  PLAN:  Diabetes Mellitus [name] [name] has been given extensive diabetes education by myself today including ideal fasting and post-prandial blood glucose readings, individual ideal Hgb A1c goals  and hypoglycemia prevention. We discussed the importance of good blood sugar control to decrease the likelihood of  diabetic complications such as nephropathy, neuropathy, limb loss, blindness, coronary artery disease, and death. We discussed the importance of intensive lifestyle modification including diet, exercise and weight loss as the first line treatment for diabetes. [name] [name] agrees to continue metformin and will follow-up at the agreed upon time.  I spent > than 50% of the 15 minute visit on counseling as documented in the note.  Obesity [name] [name] is currently in the action stage of change. As such, her goal is to continue with weight loss efforts. She has agreed to follow the Category 2 plan. [name] [name] has been instructed to continue her current exercise regimen for weight loss and overall health benefits. We discussed the following Behavioral Modification Strategies today: decreasing simple carbohydrates, decrease liquid calories, and planning for success.   [name] [name] has agreed to follow-up with our clinic in 2 weeks. She was informed of the importance of frequent follow-up visits to maximize her success with intensive lifestyle modifications for her multiple health conditions.  ALLERGIES: Allergies  Allergen Reactions   Ambien [Zolpidem] Other (See Comments)    NIGHT TERRORS   Atorvastatin Other (See Comments)    Muscle weakness Muscle Aches   Codeine Nausea And Vomiting   Penicillins Itching and Other (See Comments)    Made nose run uncontrollably Has patient had a PCN reaction causing immediate rash, facial/tongue/throat swelling, SOB or lightheadedness with hypotension: No Has patient had a PCN reaction causing severe rash involving mucus membranes or skin necrosis: No Has patient had a PCN reaction that required hospitalization No Has patient had a PCN reaction occurring within the last 10 years: No  If all of the above answers are "NO", then may proceed with Cephalosporin use.   Simvastatin Other (See Comments)    Muscle weakness   Byetta 10 Mcg Pen [Exenatide] Nausea Only     MEDICATIONS: Current Outpatient Medications on File Prior to Visit  Medication Sig Dispense Refill   beta carotene w/minerals (OCUVITE) tablet Take 1 tablet by mouth daily.     Calcium-Vitamin D-Vitamin K (VIACTIV PO) Take by mouth.     Cholecalciferol (VITAMIN D3 PO) Take by mouth.     Coenzyme Q10 (CO Q-10) 400 MG CAPS Take by mouth.     gabapentin (NEURONTIN) 300 MG capsule Take 2 capsules (600 mg total) by mouth at bedtime. 180 capsule 4   iron polysaccharides (NIFEREX) 150 MG capsule Take 150 mg by mouth daily.     magnesium oxide (MAG-OX) 400 MG tablet Take 400 mg by mouth at bedtime.      metFORMIN (GLUCOPHAGE) 500 MG tablet Take 1 tablet (500 mg total) by mouth daily with breakfast. 90 tablet 0   OVER THE COUNTER MEDICATION P5P vitamin     rosuvastatin (CRESTOR) 20 MG tablet Take 1 tablet (20 mg total) by mouth daily. (Patient taking differently: Take 20 mg by mouth at bedtime. ) 30 tablet 0   XARELTO 20 MG TABS tablet Take 20 mg by mouth daily.     No current facility-administered medications on file prior to visit.     PAST MEDICAL HISTORY: Past Medical History:  Diagnosis Date   Anemia    Arthritis    "knees, hips, possibly back" (04/08/2016)   Atrial septal aneurysm    B12 deficiency    a. following gastric bypass.   Back pain    Chronic lower back pain    spondylosis   Cold feet    Daily headache    "short ones for the last month or 2" (04/08/2016)   Depression    Diabetes (HCC)    Dry mouth    DVT (deep venous thrombosis) (HCC)    Easy bruising    Floaters in visual field    GERD (gastroesophageal reflux disease)    takes Protonix daily "just to protect my stomach; not for reflux" (04/08/2016)   Hay fever    Heart murmur    "dx'd by 1 anesthesiologist years and years ago"   History of loop recorder 03/2016   Hyperlipidemia    Hypertension    Joint pain    Leg cramp    Macular degeneration    "just watching"    Neuropathy    Nosebleed    Palpitations    Peripheral edema    Peripheral neuropathy    not on any meds   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 s/p endoscopic surgery 02/2016.   Pneumonia 12/2017   Sleep apnea    no CPAP since weight loss after bariatric surgery '12 (04/08/2016)   Spondylosis    Stroke ([place]) 04/07/2016   a. 01/2016 and 03/2016. during admission had + LE DVT.   Swelling of extremity    TIA (transient ischemic attack)    Trouble in sleeping    Urinary incontinence    Weakness     PAST SURGICAL HISTORY: Past Surgical History:  Procedure Laterality Date   CATARACT EXTRACTION W/ INTRAOCULAR LENS  IMPLANT, BILATERAL Bilateral ~ 2012   COLONOSCOPY     EP IMPLANTABLE DEVICE N/A 04/10/2016   Procedure: Loop Recorder Insertion;  Surgeon: Will Meredith Leeds, MD;  Location: [facility]  INVASIVE CV LAB;  Service: Cardiovascular;  Laterality: N/A;   ESOPHAGOGASTRODUODENOSCOPY     in the 70's   FACIAL COSMETIC SURGERY     JOINT REPLACEMENT     ROUX-EN-Y GASTRIC BYPASS  11/24/10   SINUS ENDO W/FUSION  02/28/2016   Procedure: ENDOSCOPIC SINUS SURGERY WITH NAVIGATION;  Surgeon: Ruby Cola, MD;  Location: [place];  Service: ENT;;   TEE WITHOUT CARDIOVERSION N/A 04/10/2016   Procedure: TRANSESOPHAGEAL ECHOCARDIOGRAM (TEE) POSSIBLE LOOP;  Surgeon: Sanda Klein, MD;  Location: [place];  Service: Cardiovascular;  Laterality: N/A;   [place] Right 2008   TOTAL HIP REVISION Right 03/30/2016   Procedure: RIGHT TOTAL HIP REVISION;  Surgeon: Frederik Pear, MD;  Location: [place];  Service: Orthopedics;  Laterality: Right;   TOTAL KNEE ARTHROPLASTY Bilateral 2001   VAGINAL HYSTERECTOMY     "left my ovaries"    SOCIAL HISTORY: Social History   Tobacco Use   Smoking status: Former Smoker    Packs/day: 1.00    Years: 40.00    Pack years: 40.00    Types: Cigarettes    Last attempt to quit: 08/2006    Years since quitting: 12.4    Smokeless tobacco: Never Used  Substance Use Topics   Alcohol use: Yes    Alcohol/week: 0.0 standard drinks    Comment: 04/08/2016 "5-6 drinks/year'   Drug use: No    Types: Marijuana    Comment: 04/08/2016 "did some marijuana in college"    FAMILY HISTORY: Family History  Problem Relation Age of Onset   Neuropathy Mother    Hypertension Mother    Hyperlipidemia Mother    Thyroid disease Mother    Prostate cancer Father    Lymphoma Father    Hypertension Father    Hyperlipidemia Father    Cancer Father    Obesity Father    Congestive Heart Failure Maternal Grandmother    Lung cancer Maternal Grandfather     ROS: Review of Systems  Endo/Heme/Allergies:       Negative for hypoglycemia.   PHYSICAL EXAM: Pt in no acute distress  RECENT LABS AND TESTS: BMET    Component Value Date/Time   NA 139 09/22/2018 1017   NA 139 04/29/2017 0944   K 4.3 09/22/2018 1017   K 3.9 04/29/2017 0944   CL 98 09/22/2018 1017   CO2 24 09/22/2018 1017   CO2 29 04/29/2017 0944   GLUCOSE 88 09/22/2018 1017   GLUCOSE 120 04/29/2017 0944   BUN 19 09/22/2018 1017   BUN 15.8 04/29/2017 0944   CREATININE 0.78 09/22/2018 1017   CREATININE 0.8 04/29/2017 0944   CALCIUM 9.4 09/22/2018 1017   CALCIUM 9.2 04/29/2017 0944   GFRNONAA 79 09/22/2018 1017   GFRAA 91 09/22/2018 1017   Lab Results  Component Value Date   HGBA1C 5.1 11/03/2018   HGBA1C 5.4 08/02/2018   HGBA1C 6.9 (H) 04/27/2018   HGBA1C 6.0 (H) 04/09/2016   HGBA1C 6.2 (H) 01/31/2016   Lab Results  Component Value Date   INSULIN 6.9 08/02/2018   INSULIN 7.0 04/27/2018   CBC    Component Value Date/Time   WBC 9.7 11/03/2018 1545   WBC 7.9 04/29/2017 0944   WBC 16.0 (H) 04/13/2016 1105   RBC 4.80 11/03/2018 1545   RBC 4.53 04/29/2017 0944   RBC 3.65 (L) 04/13/2016 1105   HGB 11.2 11/03/2018 1545   HGB 11.9 04/29/2017 0944   HCT 35.4 11/03/2018 1545  HCT 36.9 04/29/2017 0944   PLT 275 04/27/2018 0000    MCV 74 (L) 11/03/2018 1545   MCV 81.5 04/29/2017 0944   MCH 23.3 (L) 11/03/2018 1545   MCH 26.3 04/29/2017 0944   MCH 26.3 04/13/2016 1105   MCHC 31.6 11/03/2018 1545   MCHC 32.2 04/29/2017 0944   MCHC 31.5 04/13/2016 1105   RDW 15.3 11/03/2018 1545   RDW 14.2 04/29/2017 0944   LYMPHSABS 1.8 11/03/2018 1545   LYMPHSABS 1.7 04/29/2017 0944   MONOABS 0.6 04/29/2017 0944   EOSABS 1.0 (H) 11/03/2018 1545   BASOSABS 0.1 11/03/2018 1545   BASOSABS 0.0 04/29/2017 0944   Iron/TIBC/Ferritin/ %Sat    Component Value Date/Time   IRON 28 11/03/2018 1545   IRON 51 04/29/2017 0944   TIBC 335 11/03/2018 1545   TIBC 343 04/29/2017 0944   FERRITIN 34 11/03/2018 1545   FERRITIN 23 04/29/2017 0944   IRONPCTSAT 8 (LL) 11/03/2018 1545   IRONPCTSAT 15 (L) 04/29/2017 0944   IRONPCTSAT 12 (L) 05/29/2011 1436   Lipid Panel     Component Value Date/Time   CHOL 152 11/03/2018 1545   TRIG 109 11/03/2018 1545   HDL 61 11/03/2018 1545   CHOLHDL 2.7 04/09/2016 0155   VLDL 27 04/09/2016 0155   LDLCALC 69 11/03/2018 1545   Hepatic Function Panel     Component Value Date/Time   PROT 6.7 09/22/2018 1017   PROT 6.8 04/29/2017 0944   ALBUMIN 4.4 09/22/2018 1017   ALBUMIN 3.4 (L) 04/29/2017 0944   AST 20 09/22/2018 1017   AST 17 04/29/2017 0944   ALT 14 09/22/2018 1017   ALT 14 04/29/2017 0944   ALKPHOS 92 09/22/2018 1017   ALKPHOS 116 04/29/2017 0944   BILITOT 0.3 09/22/2018 1017   BILITOT 0.41 04/29/2017 0944   Lab Results  Component Value Date   TSH 2.000 04/27/2018    Results for HECTOR, VENNE (MRN [id number]) as of 01/12/2019 15:47  Ref. Range 11/03/2018 15:45  Vitamin D, 25-Hydroxy Latest Ref Range: 30.0 - 100.0 ng/mL 57.8   I, Michaelene Song, am acting as Location manager for [name], FNP-C.  I have reviewed the above documentation for accuracy and completeness, and I agree with the above.  - Derion Kreiter, FNP-C.

## 2019-01-26 ENCOUNTER — Other Ambulatory Visit: Payer: Self-pay

## 2019-01-26 ENCOUNTER — Encounter (INDEPENDENT_AMBULATORY_CARE_PROVIDER_SITE_OTHER): Payer: Self-pay | Admitting: Family Medicine

## 2019-01-26 ENCOUNTER — Ambulatory Visit (INDEPENDENT_AMBULATORY_CARE_PROVIDER_SITE_OTHER): Payer: Medicare Other | Admitting: Family Medicine

## 2019-01-26 DIAGNOSIS — E7849 Other hyperlipidemia: Secondary | ICD-10-CM

## 2019-01-26 DIAGNOSIS — Z6837 Body mass index (BMI) 37.0-37.9, adult: Secondary | ICD-10-CM | POA: Diagnosis not present

## 2019-01-26 DIAGNOSIS — E119 Type 2 diabetes mellitus without complications: Secondary | ICD-10-CM

## 2019-01-26 NOTE — Progress Notes (Signed)
Office: [phone]  /  Fax: [phone] TeleHealth Visit:  [name] [name] has verbally consented to this TeleHealth visit today. The patient is located at home, the provider is located at the [employer] [facility] office. The participants in this visit include the listed provider and patient. The visit was conducted today via FaceTime.  HPI:   Chief Complaint: OBESITY [name] [name] is here to discuss her progress with her obesity treatment plan. She is on the Category 2 plan and is following her eating plan approximately 75% of the time. She states she is doing video workouts and [employer] 40 minutes 5 times per week. [name] [name] states she weighed 197.6 lbs today and has lost 3.5 lbs since her last visit. She reports drinking more water since the pandemic started and is eating all of the protein on her plan. She states she misses going to the gym. We were unable to weigh the patient today for this TeleHealth visit. She feels as if she has lost 2 lbs since her last visit. She has lost 48 lbs since starting treatment with [place].  Hyperlipidemia [name] [name] has hyperlipidemia and is well controlled on Crestor. Her LDL, HDL, and triglyceride levels are all at goal. She has been trying to improve her cholesterol levels with intensive lifestyle modification including a low saturated fat diet, exercise and weight loss.  Diabetes Mellitus [name] [name] has a diagnosis of diabetes mellitus, well controlled. [name] [name] does not check her sugars. She denies any hypoglycemic episodes but does report polyphagia. Last A1c was 5.1 on 11/03/2018. She has been working on intensive lifestyle modifications including diet, exercise, and weight loss to help control her blood glucose levels.   ASSESSMENT AND PLAN:  Other hyperlipidemia  Type 2 diabetes mellitus without complication, without long-term current use of insulin (HCC)  Class 2 severe obesity with serious comorbidity and body mass index (BMI) of 37.0 to 37.9 in  adult, unspecified obesity type ([place])  PLAN:  Hyperlipidemia [name] [name] was informed of the American Heart Association Guidelines emphasizing intensive lifestyle modifications as the first line treatment for hyperlipidemia. We discussed many lifestyle modifications today in depth, and [name] [name] will continue to work on decreasing saturated fats such as fatty red meat, butter and many fried foods. She will continue Crestor, increase vegetables and lean protein in her diet, and continue to work on exercise and weight loss efforts.  Diabetes Mellitus [name] [name] has been given extensive diabetes education by myself today including ideal fasting and post-prandial blood glucose readings, individual ideal HgA1c goals  and hypoglycemia prevention. We discussed the importance of good blood sugar control to decrease the likelihood of diabetic complications such as nephropathy, neuropathy, limb loss, blindness, coronary artery disease, and death. We discussed the importance of intensive lifestyle modification including diet, exercise and weight loss as the first line treatment for diabetes. [name] [name] will increase her dose of metformin to BID with meals (no prescription needed) and agrees to follow-up with our clinic in 2 weeks.  Obesity [name] [name] is currently in the action stage of change. As such, her goal is to continue with weight loss efforts. She has agreed to follow the Category 2 plan. [name] [name] has been instructed to continue her current exercise regimen and explore "[place]" for weight loss and overall health benefits. We discussed the following Behavioral Modification Strategies today: increasing lean protein intake, decrease liquid calories, and planning for success.  [name] [name] has agreed to follow-up with our clinic in 2 weeks. She was informed of the importance of  frequent follow-up visits to maximize her success with intensive lifestyle modifications for her multiple health conditions.   ALLERGIES: Allergies  Allergen Reactions  . Ambien [Zolpidem] Other (See Comments)    NIGHT TERRORS  . Atorvastatin Other (See Comments)    Muscle weakness Muscle Aches  . Codeine Nausea And Vomiting  . Penicillins Itching and Other (See Comments)    Made nose run uncontrollably Has patient had a PCN reaction causing immediate rash, facial/tongue/throat swelling, SOB or lightheadedness with hypotension: No Has patient had a PCN reaction causing severe rash involving mucus membranes or skin necrosis: No Has patient had a PCN reaction that required hospitalization No Has patient had a PCN reaction occurring within the last 10 years: No If all of the above answers are "NO", then may proceed with Cephalosporin use.  . Simvastatin Other (See Comments)    Muscle weakness  . Byetta 10 Mcg Pen [Exenatide] Nausea Only    MEDICATIONS: Current Outpatient Medications on File Prior to Visit  Medication Sig Dispense Refill  . beta carotene w/minerals (OCUVITE) tablet Take 1 tablet by mouth daily.    . Calcium-Vitamin D-Vitamin K (VIACTIV PO) Take by mouth.    . Cholecalciferol (VITAMIN D3 PO) Take by mouth.    . Coenzyme Q10 (CO Q-10) 400 MG CAPS Take by mouth.    . gabapentin (NEURONTIN) 300 MG capsule Take 2 capsules (600 mg total) by mouth at bedtime. 180 capsule 4  . iron polysaccharides (NIFEREX) 150 MG capsule Take 150 mg by mouth daily.    . magnesium oxide (MAG-OX) 400 MG tablet Take 400 mg by mouth at bedtime.     . metFORMIN (GLUCOPHAGE) 500 MG tablet Take 1 tablet (500 mg total) by mouth daily with breakfast. 90 tablet 0  . OVER THE COUNTER MEDICATION P5P vitamin    . rosuvastatin (CRESTOR) 20 MG tablet Take 1 tablet (20 mg total) by mouth daily. (Patient taking differently: Take 20 mg by mouth at bedtime. ) 30 tablet 0  . XARELTO 20 MG TABS tablet Take 20 mg by mouth daily.     No current facility-administered medications on file prior to visit.     PAST MEDICAL HISTORY: Past  Medical History:  Diagnosis Date  . Anemia   . Arthritis    "knees, hips, possibly back" (04/08/2016)  . Atrial septal aneurysm   . B12 deficiency    a. following gastric bypass.  . Back pain   . Chronic lower back pain    spondylosis  . Cold feet   . Daily headache    "short ones for the last month or 2" (04/08/2016)  . Depression   . Diabetes ([place])   . Dry mouth   . DVT (deep venous thrombosis) ([place])   . Easy bruising   . Floaters in visual field   . GERD (gastroesophageal reflux disease)    takes Protonix daily "just to protect my stomach; not for reflux" (04/08/2016)  . Hay fever   . Heart murmur    "dx'd by 1 anesthesiologist years and years ago"  . History of loop recorder 03/2016  . Hyperlipidemia   . Hypertension   . Joint pain   . Leg cramp   . Macular degeneration    "just watching"  . Neuropathy   . Nosebleed   . Palpitations   . Peripheral edema   . Peripheral neuropathy    not on any meds  . 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 s/p endoscopic surgery  02/2016.  [name] Pneumonia 12/2017  . Sleep apnea    no CPAP since weight loss after bariatric surgery '12 (04/08/2016)  . Spondylosis   . Stroke ([place]) 04/07/2016   a. 01/2016 and 03/2016. during admission had + LE DVT.  [name] Swelling of extremity   . TIA (transient ischemic attack)   . Trouble in sleeping   . Urinary incontinence   . Weakness     PAST SURGICAL HISTORY: Past Surgical History:  Procedure Laterality Date  . CATARACT EXTRACTION W/ INTRAOCULAR LENS  IMPLANT, BILATERAL Bilateral ~ 2012  . COLONOSCOPY    . EP IMPLANTABLE DEVICE N/A 04/10/2016   Procedure: Loop Recorder Insertion;  Surgeon: Will Meredith Leeds, MD;  Location: [place] CV LAB;  Service: Cardiovascular;  Laterality: N/A;  . ESOPHAGOGASTRODUODENOSCOPY     in the 70's  . FACIAL COSMETIC SURGERY    . JOINT REPLACEMENT    . 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 SINUS ENDO W/FUSION  02/28/2016   Procedure: ENDOSCOPIC SINUS SURGERY WITH NAVIGATION;   Surgeon: Ruby Cola, MD;  Location: [place];  Service: ENT;;  . TEE WITHOUT CARDIOVERSION N/A 04/10/2016   Procedure: TRANSESOPHAGEAL ECHOCARDIOGRAM (TEE) POSSIBLE LOOP;  Surgeon: Sanda Klein, MD;  Location: [place];  Service: Cardiovascular;  Laterality: N/A;  . TONSILLECTOMY  1957  . TOTAL HIP ARTHROPLASTY Right 2008  . TOTAL HIP REVISION Right 03/30/2016   Procedure: RIGHT TOTAL HIP REVISION;  Surgeon: Frederik Pear, MD;  Location: [place];  Service: Orthopedics;  Laterality: Right;  . TOTAL KNEE ARTHROPLASTY Bilateral 2001  . VAGINAL HYSTERECTOMY     "left my ovaries"    SOCIAL HISTORY: Social History   Tobacco Use  . Smoking status: Former Smoker    Packs/day: 1.00    Years: 40.00    Pack years: 40.00    Types: Cigarettes    Last attempt to quit: 08/2006    Years since quitting: 12.4  . Smokeless tobacco: Never Used  Substance Use Topics  . Alcohol use: Yes    Alcohol/week: 0.0 standard drinks    Comment: 04/08/2016 "5-6 drinks/year'  . Drug use: No    Types: Marijuana    Comment: 04/08/2016 "did some marijuana in college"    FAMILY HISTORY: Family History  Problem Relation Age of Onset  . Neuropathy Mother   . Hypertension Mother   . Hyperlipidemia Mother   . Thyroid disease Mother   . Prostate cancer Father   . Lymphoma Father   . Hypertension Father   . Hyperlipidemia Father   . Cancer Father   . Obesity Father   . Congestive Heart Failure Maternal Grandmother   . Lung cancer Maternal Grandfather    ROS: Review of Systems  Endo/Heme/Allergies:       Positive for polyphagia. Negative for hypoglycemia.   PHYSICAL EXAM: Pt in no acute distress  RECENT LABS AND TESTS: BMET    Component Value Date/Time   NA 139 09/22/2018 1017   NA 139 04/29/2017 0944   K 4.3 09/22/2018 1017   K 3.9 04/29/2017 0944   CL 98 09/22/2018 1017   CO2 24 09/22/2018 1017   CO2 29 04/29/2017 0944   GLUCOSE 88 09/22/2018 1017   GLUCOSE 120 04/29/2017 0944   BUN 19  09/22/2018 1017   BUN 15.8 04/29/2017 0944   CREATININE 0.78 09/22/2018 1017   CREATININE 0.8 04/29/2017 0944   CALCIUM 9.4 09/22/2018 1017   CALCIUM 9.2 04/29/2017 0944   GFRNONAA 79 09/22/2018 1017  GFRAA 91 09/22/2018 1017   Lab Results  Component Value Date   HGBA1C 5.1 11/03/2018   HGBA1C 5.4 08/02/2018   HGBA1C 6.9 (H) 04/27/2018   HGBA1C 6.0 (H) 04/09/2016   HGBA1C 6.2 (H) 01/31/2016   Lab Results  Component Value Date   INSULIN 6.9 08/02/2018   INSULIN 7.0 04/27/2018   CBC    Component Value Date/Time   WBC 9.7 11/03/2018 1545   WBC 7.9 04/29/2017 0944   WBC 16.0 (H) 04/13/2016 1105   RBC 4.80 11/03/2018 1545   RBC 4.53 04/29/2017 0944   RBC 3.65 (L) 04/13/2016 1105   HGB 11.2 11/03/2018 1545   HGB 11.9 04/29/2017 0944   HCT 35.4 11/03/2018 1545   HCT 36.9 04/29/2017 0944   PLT 275 04/27/2018 0000   MCV 74 (L) 11/03/2018 1545   MCV 81.5 04/29/2017 0944   MCH 23.3 (L) 11/03/2018 1545   MCH 26.3 04/29/2017 0944   MCH 26.3 04/13/2016 1105   MCHC 31.6 11/03/2018 1545   MCHC 32.2 04/29/2017 0944   MCHC 31.5 04/13/2016 1105   RDW 15.3 11/03/2018 1545   RDW 14.2 04/29/2017 0944   LYMPHSABS 1.8 11/03/2018 1545   LYMPHSABS 1.7 04/29/2017 0944   MONOABS 0.6 04/29/2017 0944   EOSABS 1.0 (H) 11/03/2018 1545   BASOSABS 0.1 11/03/2018 1545   BASOSABS 0.0 04/29/2017 0944   Iron/TIBC/Ferritin/ %Sat    Component Value Date/Time   IRON 28 11/03/2018 1545   IRON 51 04/29/2017 0944   TIBC 335 11/03/2018 1545   TIBC 343 04/29/2017 0944   FERRITIN 34 11/03/2018 1545   FERRITIN 23 04/29/2017 0944   IRONPCTSAT 8 (LL) 11/03/2018 1545   IRONPCTSAT 15 (L) 04/29/2017 0944   IRONPCTSAT 12 (L) 05/29/2011 1436   Lipid Panel     Component Value Date/Time   CHOL 152 11/03/2018 1545   TRIG 109 11/03/2018 1545   HDL 61 11/03/2018 1545   CHOLHDL 2.7 04/09/2016 0155   VLDL 27 04/09/2016 0155   LDLCALC 69 11/03/2018 1545   Hepatic Function Panel     Component Value  Date/Time   PROT 6.7 09/22/2018 1017   PROT 6.8 04/29/2017 0944   ALBUMIN 4.4 09/22/2018 1017   ALBUMIN 3.4 (L) 04/29/2017 0944   AST 20 09/22/2018 1017   AST 17 04/29/2017 0944   ALT 14 09/22/2018 1017   ALT 14 04/29/2017 0944   ALKPHOS 92 09/22/2018 1017   ALKPHOS 116 04/29/2017 0944   BILITOT 0.3 09/22/2018 1017   BILITOT 0.41 04/29/2017 0944    Results for DISHA, COTTAM (MRN [id number]) as of 01/26/2019 14:53  Ref. Range 11/03/2018 15:45  Vitamin D, 25-Hydroxy Latest Ref Range: 30.0 - 100.0 ng/mL 57.8   I, Michaelene Song, am acting as Location manager for [name], FNP-C.  I have reviewed the above documentation for accuracy and completeness, and I agree with the above.  - Lorenza Shakir, FNP-C.

## 2019-01-31 DIAGNOSIS — H35363 Drusen (degenerative) of macula, bilateral: Secondary | ICD-10-CM | POA: Diagnosis not present

## 2019-01-31 DIAGNOSIS — E119 Type 2 diabetes mellitus without complications: Secondary | ICD-10-CM | POA: Diagnosis not present

## 2019-01-31 DIAGNOSIS — H02889 Meibomian gland dysfunction of unspecified eye, unspecified eyelid: Secondary | ICD-10-CM | POA: Diagnosis not present

## 2019-01-31 DIAGNOSIS — H35371 Puckering of macula, right eye: Secondary | ICD-10-CM | POA: Diagnosis not present

## 2019-02-07 ENCOUNTER — Ambulatory Visit (INDEPENDENT_AMBULATORY_CARE_PROVIDER_SITE_OTHER): Payer: Medicare Other | Admitting: *Deleted

## 2019-02-07 ENCOUNTER — Other Ambulatory Visit: Payer: Self-pay

## 2019-02-07 DIAGNOSIS — I6340 Cerebral infarction due to embolism of unspecified cerebral artery: Secondary | ICD-10-CM | POA: Diagnosis not present

## 2019-02-08 LAB — CUP PACEART REMOTE DEVICE CHECK
Date Time Interrogation Session: 20200520013833
Implantable Pulse Generator Implant Date: 20170721

## 2019-02-09 ENCOUNTER — Ambulatory Visit (INDEPENDENT_AMBULATORY_CARE_PROVIDER_SITE_OTHER): Payer: Medicare Other | Admitting: Family Medicine

## 2019-02-09 ENCOUNTER — Other Ambulatory Visit: Payer: Self-pay

## 2019-02-09 ENCOUNTER — Encounter (INDEPENDENT_AMBULATORY_CARE_PROVIDER_SITE_OTHER): Payer: Self-pay | Admitting: Family Medicine

## 2019-02-09 DIAGNOSIS — E119 Type 2 diabetes mellitus without complications: Secondary | ICD-10-CM | POA: Diagnosis not present

## 2019-02-09 DIAGNOSIS — E559 Vitamin D deficiency, unspecified: Secondary | ICD-10-CM | POA: Diagnosis not present

## 2019-02-09 DIAGNOSIS — E7849 Other hyperlipidemia: Secondary | ICD-10-CM | POA: Diagnosis not present

## 2019-02-09 DIAGNOSIS — Z6837 Body mass index (BMI) 37.0-37.9, adult: Secondary | ICD-10-CM

## 2019-02-09 MED ORDER — METFORMIN HCL 500 MG PO TABS: 500.0000 mg | ORAL_TABLET | Freq: Every day | ORAL | 0 refills | Status: DC

## 2019-02-14 ENCOUNTER — Encounter (INDEPENDENT_AMBULATORY_CARE_PROVIDER_SITE_OTHER): Payer: Self-pay | Admitting: Family Medicine

## 2019-02-14 NOTE — Progress Notes (Signed)
Office: [phone]  /  Fax: [phone] TeleHealth Visit:  [name] [name] has verbally consented to this TeleHealth visit today. The patient is located at home, the provider is located at the [facility] office. The participants in this visit include the listed provider and patient. The visit was conducted today via FaceTime.  HPI:   Chief Complaint: OBESITY [name] [name] is here to discuss her progress with her obesity treatment plan. She is on the Category 2 plan and is following her eating plan approximately 80% of the time. She states she is exercising via an exercise video 30-40 minutes 6 times per week. [name] [name] states she weighed 197 lbs today and she has lost 0.6 lbs since her last TeleHealth visit. She reports "cheating" over the past few weeks and states she misses going to the gym, which tends to occupy her more and keep her from eating as much.  We were unable to weigh the patient today for this TeleHealth visit. She states her weight today is 197 lbs. She has lost 48 lbs since starting treatment with [place].  Diabetes Mellitus [name] [name] has a diagnosis of diabetes type II. [name] [name] states she does not check her CBG's. Last A1c was 5.1 on 11/03/2018. She has been working on intensive lifestyle modifications including diet, exercise, and weight loss to help control her blood glucose levels. She does report polyphagia.  Hyperlipidemia [name] [name] has hyperlipidemia and has been trying to improve her cholesterol levels with intensive lifestyle modification including a low saturated fat diet, exercise and weight loss. She has hx of stroke. Her LDL is at goal at 69 and her HDL and triglycerides are within normal limits. She is on Crestor and denies any chest pain or shortness of breath.   ASSESSMENT AND PLAN:  Type 2 diabetes mellitus without complication, without long-term current use of insulin ([place]) - Plan: metFORMIN (GLUCOPHAGE) 500 MG tablet  Other hyperlipidemia  Class 2 severe  obesity with serious comorbidity and body mass index (BMI) of 37.0 to 37.9 in adult, unspecified obesity type ([place])  PLAN:  Diabetes Mellitus [name] [name] has been given extensive diabetes education by myself today including ideal fasting and post-prandial blood glucose readings, individual ideal HgA1c goals  and hypoglycemia prevention. We discussed the importance of good blood sugar control to decrease the likelihood of diabetic complications such as nephropathy, neuropathy, limb loss, blindness, coronary artery disease, and death. We discussed the importance of intensive lifestyle modification including diet, exercise and weight loss as the first line treatment for diabetes. [name] [name] was given a refill on her metformin 500 mg QAM #90 with 0 refills and agrees to follow-up with our clinic in 2 weeks.  Hyperlipidemia [name] [name] was informed of the American Heart Association Guidelines emphasizing intensive lifestyle modifications as the first line treatment for hyperlipidemia. We discussed many lifestyle modifications today in depth, and [name] [name] will continue to work on decreasing saturated fats such as fatty red meat, butter and many fried foods. She will continue Crestor, increase vegetables and lean protein in her diet, and continue to work on exercise and weight loss efforts.  Obesity [name] [name] is currently in the action stage of change. As such, her goal is to continue with weight loss efforts. She has agreed to follow the Category 2 plan. [name] [name] has been instructed to continue her current exercise regimen for weight loss and overall health benefits. We discussed the following Behavioral Modification Strategies today: decreasing simple carbohydrates, decrease liquid calories, and planning for success.  [name] [name] has agreed to  follow-up with our clinic in 2 weeks. She was informed of the importance of frequent follow-up visits to maximize her success with intensive lifestyle modifications for her multiple health  conditions.  ALLERGIES: Allergies  Allergen Reactions  . Ambien [Zolpidem] Other (See Comments)    NIGHT TERRORS  . Atorvastatin Other (See Comments)    Muscle weakness Muscle Aches  . Codeine Nausea And Vomiting  . Penicillins Itching and Other (See Comments)    Made nose run uncontrollably Has patient had a PCN reaction causing immediate rash, facial/tongue/throat swelling, SOB or lightheadedness with hypotension: No Has patient had a PCN reaction causing severe rash involving mucus membranes or skin necrosis: No Has patient had a PCN reaction that required hospitalization No Has patient had a PCN reaction occurring within the last 10 years: No If all of the above answers are "NO", then may proceed with Cephalosporin use.  . Simvastatin Other (See Comments)    Muscle weakness  . Byetta 10 Mcg Pen [Exenatide] Nausea Only    MEDICATIONS: Current Outpatient Medications on File Prior to Visit  Medication Sig Dispense Refill  . beta carotene w/minerals (OCUVITE) tablet Take 1 tablet by mouth daily.    . Calcium-Vitamin D-Vitamin K (VIACTIV PO) Take by mouth.    . Cholecalciferol (VITAMIN D3 PO) Take by mouth.    . Coenzyme Q10 (CO Q-10) 400 MG CAPS Take by mouth.    . gabapentin (NEURONTIN) 300 MG capsule Take 2 capsules (600 mg total) by mouth at bedtime. 180 capsule 4  . iron polysaccharides (NIFEREX) 150 MG capsule Take 150 mg by mouth daily.    . magnesium oxide (MAG-OX) 400 MG tablet Take 400 mg by mouth at bedtime.     [name] OVER THE COUNTER MEDICATION P5P vitamin    . rosuvastatin (CRESTOR) 20 MG tablet Take 1 tablet (20 mg total) by mouth daily. (Patient taking differently: Take 20 mg by mouth at bedtime. ) 30 tablet 0  . XARELTO 20 MG TABS tablet Take 20 mg by mouth daily.     No current facility-administered medications on file prior to visit.     PAST MEDICAL HISTORY: Past Medical History:  Diagnosis Date  . Anemia   . Arthritis    "knees, hips, possibly back"  (04/08/2016)  . Atrial septal aneurysm   . B12 deficiency    a. following gastric bypass.  . Back pain   . Chronic lower back pain    spondylosis  . Cold feet   . Daily headache    "short ones for the last month or 2" (04/08/2016)  . Depression   . Diabetes ([place])   . Dry mouth   . DVT (deep venous thrombosis) ([place])   . Easy bruising   . Floaters in visual field   . GERD (gastroesophageal reflux disease)    takes Protonix daily "just to protect my stomach; not for reflux" (04/08/2016)  . Hay fever   . Heart murmur    "dx'd by 1 anesthesiologist years and years ago"  . History of loop recorder 03/2016  . Hyperlipidemia   . Hypertension   . Joint pain   . Leg cramp   . Macular degeneration    "just watching"  . Neuropathy   . Nosebleed   . Palpitations   . Peripheral edema   . Peripheral neuropathy    not on any meds  . 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  [name] Pneumonia 12/2017  .  Sleep apnea    no CPAP since weight loss after bariatric surgery '12 (04/08/2016)  . Spondylosis   . Stroke ([place]) 04/07/2016   a. 01/2016 and 03/2016. during admission had + LE DVT.  [name] Swelling of extremity   . TIA (transient ischemic attack)   . Trouble in sleeping   . Urinary incontinence   . Weakness     PAST SURGICAL HISTORY: Past Surgical History:  Procedure Laterality Date  . CATARACT EXTRACTION W/ INTRAOCULAR LENS  IMPLANT, BILATERAL Bilateral ~ 2012  . COLONOSCOPY    . EP IMPLANTABLE DEVICE N/A 04/10/2016   Procedure: Loop Recorder Insertion;  Surgeon: Will Meredith Leeds, MD;  Location: [facility];  Service: Cardiovascular;  Laterality: N/A;  . ESOPHAGOGASTRODUODENOSCOPY     in the 70's  . FACIAL COSMETIC SURGERY    . JOINT REPLACEMENT    . 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 SINUS ENDO W/FUSION  02/28/2016   Procedure: ENDOSCOPIC SINUS SURGERY WITH NAVIGATION;  Surgeon: Ruby Cola, MD;  Location: [facility];  Service: ENT;;  . TEE WITHOUT CARDIOVERSION  N/A 04/10/2016   Procedure: TRANSESOPHAGEAL ECHOCARDIOGRAM (TEE) POSSIBLE LOOP;  Surgeon: Sanda Klein, MD;  Location: [facility];  Service: Cardiovascular;  Laterality: N/A;  . TONSILLECTOMY  1957  . TOTAL HIP ARTHROPLASTY Right 2008  . TOTAL HIP REVISION Right 03/30/2016   Procedure: RIGHT TOTAL HIP REVISION;  Surgeon: Frederik Pear, MD;  Location: [name];  Service: Orthopedics;  Laterality: Right;  . TOTAL KNEE ARTHROPLASTY Bilateral 2001  . VAGINAL HYSTERECTOMY     "left my ovaries"    SOCIAL HISTORY: Social History   Tobacco Use  . Smoking status: Former Smoker    Packs/day: 1.00    Years: 40.00    Pack years: 40.00    Types: Cigarettes    Last attempt to quit: 08/2006    Years since quitting: 12.4  . Smokeless tobacco: Never Used  Substance Use Topics  . Alcohol use: Yes    Alcohol/week: 0.0 standard drinks    Comment: 04/08/2016 "5-6 drinks/year'  . Drug use: No    Types: Marijuana    Comment: 04/08/2016 "did some marijuana in college"    FAMILY HISTORY: Family History  Problem Relation Age of Onset  . Neuropathy Mother   . Hypertension Mother   . Hyperlipidemia Mother   . Thyroid disease Mother   . Prostate cancer Father   . Lymphoma Father   . Hypertension Father   . Hyperlipidemia Father   . Cancer Father   . Obesity Father   . Congestive Heart Failure Maternal Grandmother   . Lung cancer Maternal Grandfather    ROS: Review of Systems  Respiratory: Negative for shortness of breath.   Cardiovascular: Negative for chest pain.  Endo/Heme/Allergies:       Positive for polyphagia.   PHYSICAL EXAM: Pt in no acute distress  RECENT LABS AND TESTS: BMET    Component Value Date/Time   NA 139 09/22/2018 1017   NA 139 04/29/2017 0944   K 4.3 09/22/2018 1017   K 3.9 04/29/2017 0944   CL 98 09/22/2018 1017   CO2 24 09/22/2018 1017   CO2 29 04/29/2017 0944   GLUCOSE 88 09/22/2018 1017   GLUCOSE 120 04/29/2017 0944   BUN 19 09/22/2018 1017   BUN 15.8  04/29/2017 0944   CREATININE 0.78 09/22/2018 1017   CREATININE 0.8 04/29/2017 0944   CALCIUM 9.4 09/22/2018 1017   CALCIUM 9.2 04/29/2017 0944  GFRNONAA 79 09/22/2018 1017   GFRAA 91 09/22/2018 1017   Lab Results  Component Value Date   HGBA1C 5.1 11/03/2018   HGBA1C 5.4 08/02/2018   HGBA1C 6.9 (H) 04/27/2018   HGBA1C 6.0 (H) 04/09/2016   HGBA1C 6.2 (H) 01/31/2016   Lab Results  Component Value Date   INSULIN 6.9 08/02/2018   INSULIN 7.0 04/27/2018   CBC    Component Value Date/Time   WBC 9.7 11/03/2018 1545   WBC 7.9 04/29/2017 0944   WBC 16.0 (H) 04/13/2016 1105   RBC 4.80 11/03/2018 1545   RBC 4.53 04/29/2017 0944   RBC 3.65 (L) 04/13/2016 1105   HGB 11.2 11/03/2018 1545   HGB 11.9 04/29/2017 0944   HCT 35.4 11/03/2018 1545   HCT 36.9 04/29/2017 0944   PLT 275 04/27/2018 0000   MCV 74 (L) 11/03/2018 1545   MCV 81.5 04/29/2017 0944   MCH 23.3 (L) 11/03/2018 1545   MCH 26.3 04/29/2017 0944   MCH 26.3 04/13/2016 1105   MCHC 31.6 11/03/2018 1545   MCHC 32.2 04/29/2017 0944   MCHC 31.5 04/13/2016 1105   RDW 15.3 11/03/2018 1545   RDW 14.2 04/29/2017 0944   LYMPHSABS 1.8 11/03/2018 1545   LYMPHSABS 1.7 04/29/2017 0944   MONOABS 0.6 04/29/2017 0944   EOSABS 1.0 (H) 11/03/2018 1545   BASOSABS 0.1 11/03/2018 1545   BASOSABS 0.0 04/29/2017 0944   Iron/TIBC/Ferritin/ %Sat    Component Value Date/Time   IRON 28 11/03/2018 1545   IRON 51 04/29/2017 0944   TIBC 335 11/03/2018 1545   TIBC 343 04/29/2017 0944   FERRITIN 34 11/03/2018 1545   FERRITIN 23 04/29/2017 0944   IRONPCTSAT 8 (LL) 11/03/2018 1545   IRONPCTSAT 15 (L) 04/29/2017 0944   IRONPCTSAT 12 (L) 05/29/2011 1436   Lipid Panel     Component Value Date/Time   CHOL 152 11/03/2018 1545   TRIG 109 11/03/2018 1545   HDL 61 11/03/2018 1545   CHOLHDL 2.7 04/09/2016 0155   VLDL 27 04/09/2016 0155   LDLCALC 69 11/03/2018 1545   Hepatic Function Panel     Component Value Date/Time   PROT 6.7  09/22/2018 1017   PROT 6.8 04/29/2017 0944   ALBUMIN 4.4 09/22/2018 1017   ALBUMIN 3.4 (L) 04/29/2017 0944   AST 20 09/22/2018 1017   AST 17 04/29/2017 0944   ALT 14 09/22/2018 1017   ALT 14 04/29/2017 0944   ALKPHOS 92 09/22/2018 1017   ALKPHOS 116 04/29/2017 0944   BILITOT 0.3 09/22/2018 1017   BILITOT 0.41 04/29/2017 0944    Results for SAPHYRA, HUTT (MRN [id number]) as of 02/14/2019 10:09  Ref. Range 11/03/2018 15:45  Vitamin D, 25-Hydroxy Latest Ref Range: 30.0 - 100.0 ng/mL 57.8    I, Michaelene Song, am acting as Location manager for [name], FNP-C.  I have reviewed the above documentation for accuracy and completeness, and I agree with the above.  -  , FNP-C.

## 2019-02-16 NOTE — Progress Notes (Signed)
[facility] Summary Report / Loop Recorder

## 2019-02-17 DIAGNOSIS — E119 Type 2 diabetes mellitus without complications: Secondary | ICD-10-CM | POA: Diagnosis not present

## 2019-02-17 DIAGNOSIS — D649 Anemia, unspecified: Secondary | ICD-10-CM | POA: Diagnosis not present

## 2019-02-17 DIAGNOSIS — Z7984 Long term (current) use of oral hypoglycemic drugs: Secondary | ICD-10-CM | POA: Diagnosis not present

## 2019-02-17 DIAGNOSIS — I10 Essential (primary) hypertension: Secondary | ICD-10-CM | POA: Diagnosis not present

## 2019-02-17 DIAGNOSIS — E7800 Pure hypercholesterolemia, unspecified: Secondary | ICD-10-CM | POA: Diagnosis not present

## 2019-02-22 DIAGNOSIS — D649 Anemia, unspecified: Secondary | ICD-10-CM | POA: Diagnosis not present

## 2019-02-22 DIAGNOSIS — E7800 Pure hypercholesterolemia, unspecified: Secondary | ICD-10-CM | POA: Diagnosis not present

## 2019-02-22 DIAGNOSIS — Z7984 Long term (current) use of oral hypoglycemic drugs: Secondary | ICD-10-CM | POA: Diagnosis not present

## 2019-02-22 DIAGNOSIS — E119 Type 2 diabetes mellitus without complications: Secondary | ICD-10-CM | POA: Diagnosis not present

## 2019-02-22 DIAGNOSIS — I10 Essential (primary) hypertension: Secondary | ICD-10-CM | POA: Diagnosis not present

## 2019-02-23 ENCOUNTER — Other Ambulatory Visit: Payer: Self-pay

## 2019-02-23 ENCOUNTER — Ambulatory Visit (INDEPENDENT_AMBULATORY_CARE_PROVIDER_SITE_OTHER): Payer: Medicare Other | Admitting: Family Medicine

## 2019-02-23 ENCOUNTER — Encounter (INDEPENDENT_AMBULATORY_CARE_PROVIDER_SITE_OTHER): Payer: Self-pay | Admitting: Family Medicine

## 2019-02-23 DIAGNOSIS — E119 Type 2 diabetes mellitus without complications: Secondary | ICD-10-CM | POA: Diagnosis not present

## 2019-02-23 DIAGNOSIS — Z6837 Body mass index (BMI) 37.0-37.9, adult: Secondary | ICD-10-CM

## 2019-02-23 NOTE — Progress Notes (Signed)
Office: [phone]  /  Fax: [phone] TeleHealth Visit:  [name] [name] has verbally consented to this TeleHealth visit today. The patient is located at home, the provider is located at the [facility] office. The participants in this visit include the listed provider and patient. The visit was conducted today via FaceTime.  HPI:   Chief Complaint: OBESITY [name] [name] is here to discuss her progress with her obesity treatment plan. She is on the Category 2 plan and is following her eating plan approximately 80% of the time. She states she is exercising via video 40 minutes 5 times per week. [name] [name] states her weight today is 192 lbs, reflecting a 5 lb weight loss since her last [place] visit. She reports she has been sticking to the plan better, although struggles getting her protein in. She usually does get it in.  We were unable to weigh the patient today for this TeleHealth visit. She feels as if she has lost weight since her last visit. She has lost 48 lbs since starting treatment with [place].  Diabetes Mellitus [name] [name] has a diagnosis of diabetes mellitus, well controlled on metformin. [name] [name] states she does not check her sugars. Last A1c was 5.1 on 11/03/2018. She has been working on intensive lifestyle modifications including diet, exercise, and weight loss to help control her blood glucose levels.  ASSESSMENT AND PLAN:  Type 2 diabetes mellitus without complication, without long-term current use of insulin ([facility])  Class 2 severe obesity with serious comorbidity and body mass index (BMI) of 37.0 to 37.9 in adult, unspecified obesity type ([place])  PLAN:  Diabetes Mellitus [name] [name] has been given extensive diabetes education by myself today including ideal fasting and post-prandial blood glucose readings, individual ideal HgA1c goals  and hypoglycemia prevention. We discussed the importance of good blood sugar control to decrease the likelihood of diabetic complications such as  nephropathy, neuropathy, limb loss, blindness, coronary artery disease, and death. We discussed the importance of intensive lifestyle modification including diet, exercise and weight loss as the first line treatment for diabetes. [name] [name] agrees to continue metformin and will follow-up at the agreed upon time.  I spent > than 50% of the 15 minute visit on counseling as documented in the note.  Obesity [name] [name] is currently in the action stage of change. As such, her goal is to continue with weight loss efforts. She has agreed to follow the Category 2 plan. [name] [name] has been instructed to continue her current exercise regimen for weight loss and overall health benefits. We discussed the following Behavioral Modification Strategies today: increasing lean protein intake and planning for success.  [name] [name] has agreed to follow-up with our clinic in 2-3 weeks. She was informed of the importance of frequent follow-up visits to maximize her success with intensive lifestyle modifications for her multiple health conditions.  ALLERGIES: Allergies  Allergen Reactions   Ambien [Zolpidem] Other (See Comments)    NIGHT TERRORS   Atorvastatin Other (See Comments)    Muscle weakness Muscle Aches   Codeine Nausea And Vomiting   Penicillins Itching and Other (See Comments)    Made nose run uncontrollably Has patient had a PCN reaction causing immediate rash, facial/tongue/throat swelling, SOB or lightheadedness with hypotension: No Has patient had a PCN reaction causing severe rash involving mucus membranes or skin necrosis: No Has patient had a PCN reaction that required hospitalization No Has patient had a PCN reaction occurring within the last 10 years: No If all of the above answers are "NO",  then may proceed with Cephalosporin use.   Simvastatin Other (See Comments)    Muscle weakness   Byetta 10 Mcg Pen [Exenatide] Nausea Only    MEDICATIONS: Current Outpatient Medications on File Prior to Visit   Medication Sig Dispense Refill   beta carotene w/minerals (OCUVITE) tablet Take 1 tablet by mouth daily.     Calcium-Vitamin D-Vitamin K (VIACTIV PO) Take by mouth.     Cholecalciferol (VITAMIN D3 PO) Take by mouth.     Coenzyme Q10 (CO Q-10) 400 MG CAPS Take by mouth.     gabapentin (NEURONTIN) 300 MG capsule Take 2 capsules (600 mg total) by mouth at bedtime. 180 capsule 4   iron polysaccharides (NIFEREX) 150 MG capsule Take 150 mg by mouth daily.     magnesium oxide (MAG-OX) 400 MG tablet Take 400 mg by mouth at bedtime.      metFORMIN (GLUCOPHAGE) 500 MG tablet Take 1 tablet (500 mg total) by mouth daily with breakfast. 90 tablet 0   OVER THE COUNTER MEDICATION P5P vitamin     rosuvastatin (CRESTOR) 20 MG tablet Take 1 tablet (20 mg total) by mouth daily. (Patient taking differently: Take 20 mg by mouth at bedtime. ) 30 tablet 0   XARELTO 20 MG TABS tablet Take 20 mg by mouth daily.     No current facility-administered medications on file prior to visit.     PAST MEDICAL HISTORY: Past Medical History:  Diagnosis Date   Anemia    Arthritis    "knees, hips, possibly back" (04/08/2016)   Atrial septal aneurysm    B12 deficiency    a. following gastric bypass.   Back pain    Chronic lower back pain    spondylosis   Cold feet    Daily headache    "short ones for the last month or 2" (04/08/2016)   Depression    Diabetes ([facility])    Dry mouth    DVT (deep venous thrombosis) ([facility])    Easy bruising    Floaters in visual field    GERD (gastroesophageal reflux disease)    takes Protonix daily "just to protect my stomach; not for reflux" (04/08/2016)   Hay fever    Heart murmur    "dx'd by 1 anesthesiologist years and years ago"   History of loop recorder 03/2016   Hyperlipidemia    Hypertension    Joint pain    Leg cramp    Macular degeneration    "just watching"   Neuropathy    Nosebleed    Palpitations    Peripheral edema     Peripheral neuropathy    not on any meds   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 s/p endoscopic surgery 02/2016.   Pneumonia 12/2017   Sleep apnea    no CPAP since weight loss after bariatric surgery '12 (04/08/2016)   Spondylosis    Stroke ([place]) 04/07/2016   a. 01/2016 and 03/2016. during admission had + LE DVT.   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   SINUS ENDO W/FUSION  02/28/2016   Procedure: ENDOSCOPIC SINUS SURGERY WITH NAVIGATION;  Surgeon: Ruby Cola, MD;  Location: [facility];  Service: ENT;;   TEE WITHOUT CARDIOVERSION N/A 04/10/2016   Procedure: TRANSESOPHAGEAL ECHOCARDIOGRAM (TEE) POSSIBLE LOOP;  Surgeon: Sanda Klein, MD;  Location: [facility];  Service: Cardiovascular;  Laterality: N/A;   [place] Right 2008   TOTAL HIP REVISION Right 03/30/2016   Procedure: RIGHT TOTAL HIP REVISION;  Surgeon: Frederik Pear, MD;  Location: [facility];  Service: Orthopedics;  Laterality: Right;   TOTAL KNEE ARTHROPLASTY Bilateral 2001   VAGINAL HYSTERECTOMY     "left my ovaries"    SOCIAL HISTORY: Social History   Tobacco Use   Smoking status: Former Smoker    Packs/day: 1.00    Years: 40.00    Pack years: 40.00    Types: Cigarettes    Last attempt to quit: 08/2006    Years since quitting: 12.5   Smokeless tobacco: Never Used  Substance Use Topics   Alcohol use: Yes     Alcohol/week: 0.0 standard drinks    Comment: 04/08/2016 "5-6 drinks/year'   Drug use: No    Types: Marijuana    Comment: 04/08/2016 "did some marijuana in college"    FAMILY HISTORY: Family History  Problem Relation Age of Onset   Neuropathy Mother    Hypertension Mother    Hyperlipidemia Mother    Thyroid disease Mother    Prostate cancer Father    Lymphoma Father    Hypertension Father    Hyperlipidemia Father    Cancer Father    Obesity Father    Congestive Heart Failure Maternal Grandmother    Lung cancer Maternal Grandfather     ROS: ROS none noted.  PHYSICAL EXAM: Pt in no acute distress  RECENT LABS AND TESTS: BMET    Component Value Date/Time   NA 139 09/22/2018 1017   NA 139 04/29/2017 0944   K 4.3 09/22/2018 1017   K 3.9 04/29/2017 0944   CL 98 09/22/2018 1017   CO2 24 09/22/2018 1017   CO2 29 04/29/2017 0944   GLUCOSE 88 09/22/2018 1017   GLUCOSE 120 04/29/2017 0944   BUN 19 09/22/2018 1017   BUN 15.8 04/29/2017 0944   CREATININE 0.78 09/22/2018 1017   CREATININE 0.8 04/29/2017 0944   CALCIUM 9.4 09/22/2018 1017   CALCIUM 9.2 04/29/2017 0944   GFRNONAA 79 09/22/2018 1017   GFRAA 91 09/22/2018 1017   Lab Results  Component Value Date   HGBA1C 5.1 11/03/2018   HGBA1C 5.4 08/02/2018   HGBA1C 6.9 (H) 04/27/2018   HGBA1C 6.0 (H) 04/09/2016   HGBA1C 6.2 (H) 01/31/2016   Lab Results  Component Value Date   INSULIN 6.9 08/02/2018   INSULIN 7.0 04/27/2018   CBC    Component Value Date/Time   WBC 9.7 11/03/2018 1545   WBC 7.9 04/29/2017 0944   WBC 16.0 (H) 04/13/2016 1105   RBC 4.80 11/03/2018 1545   RBC 4.53 04/29/2017 0944   RBC 3.65 (L) 04/13/2016 1105   HGB 11.2 11/03/2018 1545   HGB 11.9 04/29/2017 0944   HCT 35.4 11/03/2018 1545   HCT 36.9 04/29/2017 0944   PLT 275 04/27/2018 0000   MCV 74 (L) 11/03/2018 1545   MCV  81.5 04/29/2017 0944   MCH 23.3 (L) 11/03/2018 1545   MCH 26.3 04/29/2017 0944   MCH 26.3 04/13/2016  1105   MCHC 31.6 11/03/2018 1545   MCHC 32.2 04/29/2017 0944   MCHC 31.5 04/13/2016 1105   RDW 15.3 11/03/2018 1545   RDW 14.2 04/29/2017 0944   LYMPHSABS 1.8 11/03/2018 1545   LYMPHSABS 1.7 04/29/2017 0944   MONOABS 0.6 04/29/2017 0944   EOSABS 1.0 (H) 11/03/2018 1545   BASOSABS 0.1 11/03/2018 1545   BASOSABS 0.0 04/29/2017 0944   Iron/TIBC/Ferritin/ %Sat    Component Value Date/Time   IRON 28 11/03/2018 1545   IRON 51 04/29/2017 0944   TIBC 335 11/03/2018 1545   TIBC 343 04/29/2017 0944   FERRITIN 34 11/03/2018 1545   FERRITIN 23 04/29/2017 0944   IRONPCTSAT 8 (LL) 11/03/2018 1545   IRONPCTSAT 15 (L) 04/29/2017 0944   IRONPCTSAT 12 (L) 05/29/2011 1436   Lipid Panel     Component Value Date/Time   CHOL 152 11/03/2018 1545   TRIG 109 11/03/2018 1545   HDL 61 11/03/2018 1545   CHOLHDL 2.7 04/09/2016 0155   VLDL 27 04/09/2016 0155   LDLCALC 69 11/03/2018 1545   Hepatic Function Panel     Component Value Date/Time   PROT 6.7 09/22/2018 1017   PROT 6.8 04/29/2017 0944   ALBUMIN 4.4 09/22/2018 1017   ALBUMIN 3.4 (L) 04/29/2017 0944   AST 20 09/22/2018 1017   AST 17 04/29/2017 0944   ALT 14 09/22/2018 1017   ALT 14 04/29/2017 0944   ALKPHOS 92 09/22/2018 1017   ALKPHOS 116 04/29/2017 0944   BILITOT 0.3 09/22/2018 1017   BILITOT 0.41 04/29/2017 0944   Lab Results  Component Value Date   TSH 2.000 04/27/2018    Results for [name] [name], [name] [name] (MRN [id number]) as of 02/23/2019 10:49  Ref. Range 11/03/2018 15:45  Vitamin D, 25-Hydroxy Latest Ref Range: 30.0 - 100.0 ng/mL 57.8    I, Michaelene Song, am acting as Location manager for [name], FNP-C.  I have reviewed the above documentation for accuracy and completeness, and I agree with the above.  - Claire Dolores, FNP-C.

## 2019-02-27 ENCOUNTER — Ambulatory Visit (INDEPENDENT_AMBULATORY_CARE_PROVIDER_SITE_OTHER): Payer: Medicare Other | Admitting: Podiatry

## 2019-02-27 ENCOUNTER — Other Ambulatory Visit: Payer: Self-pay

## 2019-02-27 ENCOUNTER — Encounter: Payer: Self-pay | Admitting: Podiatry

## 2019-02-27 VITALS — Temp 97.3°F

## 2019-02-27 DIAGNOSIS — D689 Coagulation defect, unspecified: Secondary | ICD-10-CM | POA: Diagnosis not present

## 2019-02-27 DIAGNOSIS — Q828 Other specified congenital malformations of skin: Secondary | ICD-10-CM | POA: Diagnosis not present

## 2019-02-27 DIAGNOSIS — E1149 Type 2 diabetes mellitus with other diabetic neurological complication: Secondary | ICD-10-CM | POA: Diagnosis not present

## 2019-02-27 DIAGNOSIS — I6340 Cerebral infarction due to embolism of unspecified cerebral artery: Secondary | ICD-10-CM | POA: Diagnosis not present

## 2019-02-27 DIAGNOSIS — E1140 Type 2 diabetes mellitus with diabetic neuropathy, unspecified: Secondary | ICD-10-CM

## 2019-02-27 NOTE — Progress Notes (Signed)
Subjective:   Patient ID: [name] [name], female   DOB: 68 y.o.   MRN: [id number]   HPI Patient presents with lesions around the fifth metatarsal head of both feet with fluid buildup of a mild to moderate nature and is also noted to have significant diabetic neuropathic condition bilateral with balance issues associated with this with patient trying to stay active as best as possible   ROS      Objective:  Physical Exam  Neurovascular status intact with patient found to have lesions around the fifth MPJ bilateral with fluid buildup around the area and pain with palpation with patient having moderate balance issues noted     Assessment:  Inflammatory condition with keratotic tissue formation and pain fifth MPJ bilateral with neuropathic condition secondary with balance issues     Plan:  H&P debridement of lesions accomplished with patient having history of clotting disorder and is on blood thinner and applied bandage therapy.  I then went ahead and discussed balance issues discussed balance braces which we are can continue to hold off currently

## 2019-03-08 DIAGNOSIS — H00022 Hordeolum internum right lower eyelid: Secondary | ICD-10-CM | POA: Diagnosis not present

## 2019-03-13 ENCOUNTER — Other Ambulatory Visit: Payer: Self-pay

## 2019-03-13 ENCOUNTER — Encounter (INDEPENDENT_AMBULATORY_CARE_PROVIDER_SITE_OTHER): Payer: Self-pay | Admitting: Family Medicine

## 2019-03-13 ENCOUNTER — Ambulatory Visit (INDEPENDENT_AMBULATORY_CARE_PROVIDER_SITE_OTHER): Payer: Medicare Other | Admitting: Family Medicine

## 2019-03-13 ENCOUNTER — Ambulatory Visit (INDEPENDENT_AMBULATORY_CARE_PROVIDER_SITE_OTHER): Payer: Medicare Other | Admitting: *Deleted

## 2019-03-13 DIAGNOSIS — Z6837 Body mass index (BMI) 37.0-37.9, adult: Secondary | ICD-10-CM

## 2019-03-13 DIAGNOSIS — E119 Type 2 diabetes mellitus without complications: Secondary | ICD-10-CM | POA: Diagnosis not present

## 2019-03-13 DIAGNOSIS — I6340 Cerebral infarction due to embolism of unspecified cerebral artery: Secondary | ICD-10-CM | POA: Diagnosis not present

## 2019-03-13 LAB — CUP PACEART REMOTE DEVICE CHECK
Date Time Interrogation Session: 20200622021146
Implantable Pulse Generator Implant Date: 20170721

## 2019-03-14 NOTE — Progress Notes (Signed)
Office: [phone]  /  Fax: [phone] TeleHealth Visit:  [name] [name] has verbally consented to this TeleHealth visit today. The patient is located at home, the provider is located at the [facility] office. The participants in this visit include the listed provider and patient. The visit was conducted today via FaceTime.  HPI:   Chief Complaint: OBESITY [name] [name] is here to discuss her progress with her obesity treatment plan. She is on the Category 2 plan and is following her eating plan approximately 80% of the time. She states she is exercising with [employer] video 30 minutes 5 times per week. Charnel states her weight is stable at 192.4 lbs. She has eaten several meals out recently and cheated with several meals and she is happy to have maintained her weight. We were unable to weigh the patient today for this TeleHealth visit. She feels as if she has lost weight since her last visit. She has lost 48 lbs since starting treatment with [place].  Diabetes Mellitus [name] [name] has a diagnosis of diabetes mellitus, which is well controlled on metformin. [name] [name] reports she does not check her sugars. Last A1c was reported to be 5.1 on 02/27/2019 at [facility]. She has been working on intensive lifestyle modifications including diet, exercise, and weight loss to help control her blood glucose levels. Negative polyphagia.  ASSESSMENT AND PLAN:  Type 2 diabetes mellitus without complication, without long-term current use of insulin (HCC)  Class 2 severe obesity with serious comorbidity and body mass index (BMI) of 37.0 to 37.9 in adult, unspecified obesity type ([place])  PLAN:  Diabetes Mellitus [name] [name] has been given extensive diabetes education by myself today including ideal fasting and post-prandial blood glucose readings, individual ideal Hgb A1c goals  and hypoglycemia prevention. We discussed the importance of good blood sugar control to decrease the likelihood of  diabetic complications such as nephropathy, neuropathy, limb loss, blindness, coronary artery disease, and death. We discussed the importance of intensive lifestyle modification including diet, exercise and weight loss as the first line treatment for diabetes. [name] [name] will continue metformin and follow-up with our clinic at the agreed upon time.  I spent > than 50% of the 15 minute visit on counseling as documented in the note.  Obesity [name] [name] is currently in the action stage of change. As such, her goal is to continue with weight loss efforts. She has agreed to follow the Category 2 plan. [name] [name] has been instructed to continue her current exercise regimen for weight loss and overall health benefits. We discussed the following Behavioral Modification Strategies today: decrease eating out and planning for success.  [name] [name] has agreed to follow-up with our clinic in 2-3 weeks. She was informed of the importance of frequent follow-up visits to maximize her success with intensive lifestyle modifications for her multiple health conditions.  ALLERGIES: Allergies  Allergen Reactions   Ambien [Zolpidem] Other (See Comments)    NIGHT TERRORS   Atorvastatin Other (See Comments)    Muscle weakness Muscle Aches   Codeine Nausea And Vomiting   Penicillins Itching and Other (See Comments)    Made nose run uncontrollably Has patient had a PCN reaction causing immediate rash, facial/tongue/throat swelling, SOB or lightheadedness with hypotension: No Has patient had a PCN reaction causing severe rash involving mucus membranes or skin necrosis: No Has patient had a PCN reaction that required hospitalization No Has patient had a PCN reaction occurring within the last 10 years: No If all of the above answers are "  NO", then may proceed with Cephalosporin use.   Simvastatin Other (See Comments)    Muscle weakness   [place]etta 10 Mcg Pen [Exenatide] Nausea Only    MEDICATIONS: Current Outpatient  Medications on File Prior to Visit  Medication Sig Dispense Refill   beta carotene w/minerals (OCUVITE) tablet Take 1 tablet by mouth daily.     Calcium-Vitamin D-Vitamin K (VIACTIV PO) Take by mouth.     Cholecalciferol (VITAMIN D3 PO) Take by mouth.     Coenzyme Q10 (CO Q-10) 400 MG CAPS Take by mouth.     gabapentin (NEURONTIN) 300 MG capsule Take 2 capsules (600 mg total) by mouth at bedtime. 180 capsule 4   iron polysaccharides (NIFEREX) 150 MG capsule Take 150 mg by mouth daily.     magnesium oxide (MAG-OX) 400 MG tablet Take 400 mg by mouth at bedtime.      metFORMIN (GLUCOPHAGE) 500 MG tablet Take 1 tablet (500 mg total) by mouth daily with breakfast. 90 tablet 0   OVER THE COUNTER MEDICATION P5P vitamin     rosuvastatin (CRESTOR) 20 MG tablet Take 1 tablet (20 mg total) by mouth daily. (Patient taking differently: Take 20 mg by mouth at bedtime. ) 30 tablet 0   XARELTO 20 MG TABS tablet Take 20 mg by mouth daily.     No current facility-administered medications on file prior to visit.     PAST MEDICAL HISTORY: Past Medical History:  Diagnosis Date   Anemia    Arthritis    "knees, hips, possibly back" (04/08/2016)   Atrial septal aneurysm    B12 deficiency    a. following gastric bypass.   Back pain    Chronic lower back pain    spondylosis   Cold feet    Daily headache    "short ones for the last month or 2" (04/08/2016)   Depression    Diabetes (HCC)    Dry mouth    DVT (deep venous thrombosis) (HCC)    Easy bruising    Floaters in visual field    GERD (gastroesophageal reflux disease)    takes Protonix daily "just to protect my stomach; not for reflux" (04/08/2016)   Hay fever    Heart murmur    "dx'd by 1 anesthesiologist years and years ago"   History of loop recorder 03/2016   Hyperlipidemia    Hypertension    Joint pain    Leg cramp    Macular degeneration    "just watching"   Neuropathy    Nosebleed    Palpitations     Peripheral edema    Peripheral neuropathy    not on any meds   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   Pneumonia 12/2017   Sleep apnea    no CPAP since weight loss after bariatric surgery '12 (04/08/2016)   Spondylosis    Stroke ([place]) 04/07/2016   a. 01/2016 and 03/2016. during admission had + LE DVT.   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   SINUS ENDO W/FUSION  02/28/2016   Procedure: ENDOSCOPIC SINUS SURGERY WITH NAVIGATION;  Surgeon: Ruby Cola, MD;  Location: [place];  Service: ENT;;   TEE WITHOUT CARDIOVERSION N/A 04/10/2016   Procedure: TRANSESOPHAGEAL ECHOCARDIOGRAM (TEE) POSSIBLE LOOP;  Surgeon: Sanda Klein, MD;  Location: [facility];  Service: Cardiovascular;  Laterality: N/A;   [place] Right 2008   TOTAL HIP REVISION Right 03/30/2016   Procedure: RIGHT TOTAL HIP REVISION;  Surgeon: Frederik Pear, MD;  Location: [place];  Service: Orthopedics;  Laterality: Right;   TOTAL KNEE ARTHROPLASTY Bilateral 2001   VAGINAL HYSTERECTOMY     "left my ovaries"    SOCIAL HISTORY: Social History   Tobacco Use   Smoking status: Former Smoker    Packs/day: 1.00    Years: 40.00    Pack years: 40.00    Types: Cigarettes    Quit date: 08/2006    Years since quitting: 12.5   Smokeless tobacco: Never Used  Substance Use Topics    Alcohol use: Yes    Alcohol/week: 0.0 standard drinks    Comment: 04/08/2016 "5-6 drinks/year'   Drug use: No    Types: Marijuana    Comment: 04/08/2016 "did some marijuana in college"    FAMILY HISTORY: Family History  Problem Relation Age of Onset   Neuropathy Mother    Hypertension Mother    Hyperlipidemia Mother    Thyroid disease Mother    Prostate cancer Father    Lymphoma Father    Hypertension Father    Hyperlipidemia Father    Cancer Father    Obesity Father    Congestive Heart Failure Maternal Grandmother    Lung cancer Maternal Grandfather    ROS: Review of Systems  Endo/Heme/Allergies:       Negative for polyphagia.   PHYSICAL EXAM: Pt in no acute distress  RECENT LABS AND TESTS: BMET    Component Value Date/Time   NA 139 09/22/2018 1017   NA 139 04/29/2017 0944   K 4.3 09/22/2018 1017   K 3.9 04/29/2017 0944   CL 98 09/22/2018 1017   CO2 24 09/22/2018 1017   CO2 29 04/29/2017 0944   GLUCOSE 88 09/22/2018 1017   GLUCOSE 120 04/29/2017 0944   BUN 19 09/22/2018 1017   BUN 15.8 04/29/2017 0944   CREATININE 0.78 09/22/2018 1017   CREATININE 0.8 04/29/2017 0944   CALCIUM 9.4 09/22/2018 1017   CALCIUM 9.2 04/29/2017 0944   GFRNONAA 79 09/22/2018 1017   GFRAA 91 09/22/2018 1017   Lab Results  Component Value Date   HGBA1C 5.1 11/03/2018   HGBA1C 5.4 08/02/2018   HGBA1C 6.9 (H) 04/27/2018   HGBA1C 6.0 (H) 04/09/2016   HGBA1C 6.2 (H) 01/31/2016   Lab Results  Component Value Date   INSULIN 6.9 08/02/2018   INSULIN 7.0 04/27/2018   CBC    Component Value Date/Time   WBC 9.7 11/03/2018 1545   WBC 7.9 04/29/2017 0944   WBC 16.0 (H) 04/13/2016 1105   RBC 4.80 11/03/2018 1545   RBC 4.53 04/29/2017 0944   RBC 3.65 (L) 04/13/2016 1105   HGB 11.2 11/03/2018 1545   HGB 11.9 04/29/2017 0944   HCT 35.4 11/03/2018 1545   HCT 36.9 04/29/2017 0944   PLT 275 04/27/2018 0000  MCV 74 (L) 11/03/2018 1545   MCV 81.5 04/29/2017 0944   MCH  23.3 (L) 11/03/2018 1545   MCH 26.3 04/29/2017 0944   MCH 26.3 04/13/2016 1105   MCHC 31.6 11/03/2018 1545   MCHC 32.2 04/29/2017 0944   MCHC 31.5 04/13/2016 1105   RDW 15.3 11/03/2018 1545   RDW 14.2 04/29/2017 0944   LYMPHSABS 1.8 11/03/2018 1545   LYMPHSABS 1.7 04/29/2017 0944   MONOABS 0.6 04/29/2017 0944   EOSABS 1.0 (H) 11/03/2018 1545   BASOSABS 0.1 11/03/2018 1545   BASOSABS 0.0 04/29/2017 0944   Iron/TIBC/Ferritin/ %Sat    Component Value Date/Time   IRON 28 11/03/2018 1545   IRON 51 04/29/2017 0944   TIBC 335 11/03/2018 1545   TIBC 343 04/29/2017 0944   FERRITIN 34 11/03/2018 1545   FERRITIN 23 04/29/2017 0944   IRONPCTSAT 8 (LL) 11/03/2018 1545   IRONPCTSAT 15 (L) 04/29/2017 0944   IRONPCTSAT 12 (L) 05/29/2011 1436   Lipid Panel     Component Value Date/Time   CHOL 152 11/03/2018 1545   TRIG 109 11/03/2018 1545   HDL 61 11/03/2018 1545   CHOLHDL 2.7 04/09/2016 0155   VLDL 27 04/09/2016 0155   LDLCALC 69 11/03/2018 1545   Hepatic Function Panel     Component Value Date/Time   PROT 6.7 09/22/2018 1017   PROT 6.8 04/29/2017 0944   ALBUMIN 4.4 09/22/2018 1017   ALBUMIN 3.4 (L) 04/29/2017 0944   AST 20 09/22/2018 1017   AST 17 04/29/2017 0944   ALT 14 09/22/2018 1017   ALT 14 04/29/2017 0944   ALKPHOS 92 09/22/2018 1017   ALKPHOS 116 04/29/2017 0944   BILITOT 0.3 09/22/2018 1017   BILITOT 0.41 04/29/2017 0944   Lab Results  Component Value Date   TSH 2.000 04/27/2018    Results for TAMIE, MINTEER (MRN [id number]) as of 03/14/2019 07:54  Ref. Range 11/03/2018 15:45  Vitamin D, 25-Hydroxy Latest Ref Range: 30.0 - 100.0 ng/mL 57.8    I, Michaelene Song, am acting as Location manager for [name], FNP  I have reviewed the above documentation for accuracy and completeness, and I agree with the above.  - Noboru Bidinger, FNP-C.

## 2019-03-16 ENCOUNTER — Encounter (INDEPENDENT_AMBULATORY_CARE_PROVIDER_SITE_OTHER): Payer: Self-pay | Admitting: Family Medicine

## 2019-03-22 NOTE — Progress Notes (Signed)
[facility] Summary Report / Loop Recorder

## 2019-03-28 ENCOUNTER — Other Ambulatory Visit: Payer: Self-pay

## 2019-03-28 ENCOUNTER — Telehealth (INDEPENDENT_AMBULATORY_CARE_PROVIDER_SITE_OTHER): Payer: Medicare Other | Admitting: Bariatrics

## 2019-03-28 ENCOUNTER — Encounter (INDEPENDENT_AMBULATORY_CARE_PROVIDER_SITE_OTHER): Payer: Self-pay | Admitting: Bariatrics

## 2019-03-28 DIAGNOSIS — M25471 Effusion, right ankle: Secondary | ICD-10-CM

## 2019-03-28 DIAGNOSIS — E559 Vitamin D deficiency, unspecified: Secondary | ICD-10-CM | POA: Diagnosis not present

## 2019-03-28 DIAGNOSIS — Z6835 Body mass index (BMI) 35.0-35.9, adult: Secondary | ICD-10-CM

## 2019-03-28 DIAGNOSIS — M25472 Effusion, left ankle: Secondary | ICD-10-CM

## 2019-03-28 DIAGNOSIS — I10 Essential (primary) hypertension: Secondary | ICD-10-CM | POA: Diagnosis not present

## 2019-03-28 DIAGNOSIS — E119 Type 2 diabetes mellitus without complications: Secondary | ICD-10-CM

## 2019-03-28 NOTE — Progress Notes (Signed)
Office: [phone]  /  Fax: [phone] TeleHealth Visit:  [name] [name] has verbally consented to this TeleHealth visit today. The patient is located at home, the provider is located at the [facility] office. The participants in this visit include the listed provider and patient and any and all parties involved. The visit was conducted today via FaceTime.  HPI:   Chief Complaint: OBESITY [name] [name] is here to discuss her progress with her obesity treatment plan. She is on the Category 2 plan and is following her eating plan approximately 80 % of the time. She states she is doing Silver Sneakers 30 to 40 minutes 5 times per week. [name] [name] states that she is down in weight (weight 188 lbs). If her weight is calculated from 03/13/19, then she is down 4.4 pounds. [name] [name] has had some ankle swelling. She had been on blood pressure medications in the past. We were unable to weigh the patient today for this TeleHealth visit. She feels as if she has lost weight since her last visit. She has lost 70 lbs since starting treatment with [place].  Diabetes II with neuropathy [name] [name] has a diagnosis of diabetes type II. [name] [name] is taking Metformin. Her last A1c was at 5.1 and she is well controlled. [name] [name] does not check her blood sugar. She has been working on intensive lifestyle modifications including diet, exercise, and weight loss to help control her blood glucose levels.  Vitamin D deficiency [name] [name] has a diagnosis of vitamin D deficiency. Her last vitamin D level was at 57.0 and is well controlled. [name] [name] is currently taking vit D and she denies nausea, vomiting or muscle weakness.  Hypertension [name] [name] is a 68 y.o. female with hypertension. She is not on medications. [name] [name] does have some foot and leg cramps. [name] [name] denies increased shortness of breath on exertion. She is working weight loss to help control her blood pressure with the goal of decreasing her risk of heart  attack and stroke. [name] [name] blood pressure is consistently good.  Ankle Edema [name] [name] has ankle edema and she denies shortness of breath or palpitations.  ASSESSMENT AND PLAN:  Type 2 diabetes mellitus without complication, without long-term current use of insulin (HCC)  Vitamin D deficiency  Class 2 severe obesity with serious comorbidity and body mass index (BMI) of 35.0 to 35.9 in adult, unspecified obesity type ([name] [name])  Essential hypertension  Ankle edema, bilateral  PLAN:  Diabetes II with neuropathy [name] [name] has been given extensive diabetes education by myself today including ideal fasting and post-prandial blood glucose readings, individual ideal Hgb A1c goals and hypoglycemia prevention. We discussed the importance of good blood sugar control to decrease the likelihood of diabetic complications such as nephropathy, neuropathy, limb loss, blindness, coronary artery disease, and death. We discussed the importance of intensive lifestyle modification including diet, exercise and weight loss as the first line treatment for diabetes. [name] [name] agrees to continue her diabetes medications and will follow up at the agreed upon time.  Vitamin D Deficiency [name] [name] was informed that low vitamin D levels contributes to fatigue and are associated with obesity, breast, and colon cancer. She will continue OTC vitamin D and will follow up for routine testing of vitamin D, at least 2-3 times per year. She was informed of the risk of over-replacement of vitamin D and agrees to not increase her dose unless she discusses this with [place] first.  Hypertension We discussed sodium restriction, working on healthy weight loss, and a regular exercise  program as the means to achieve improved blood pressure control. [name] [name] agreed with this plan and agreed to follow up as directed. We will continue to monitor her blood pressure as well as her progress with the above lifestyle modifications. She will increase her water intake  and she will check her blood pressure at home. [name] [name] will watch for signs of hypotension as she continues her lifestyle modifications.  Ankle Edema We will consider magnesium, B12 and electrolytes at the next visit, if not resolved. [name] [name] will elevate her legs above her heart.  Obesity [name] [name] is currently in the action stage of change. As such, her goal is to continue with weight loss efforts She has agreed to follow the Category 2 plan [name] [name] will continue with Silver Sneakers for weight loss and overall health benefits. She will continue with activity. We discussed the following Behavioral Modification Strategies today: planning for success, increase H2O intake, no skipping meals, keeping healthy foods in the home, increasing lean protein intake, decreasing simple carbohydrates, increasing vegetables, decrease eating out and work on meal planning and intentional eating  [name] [name] has agreed to follow up with our clinic in 2 weeks. She was informed of the importance of frequent follow up visits to maximize her success with intensive lifestyle modifications for her multiple health conditions.  ALLERGIES: Allergies  Allergen Reactions  . Ambien [Zolpidem] Other (See Comments)    NIGHT TERRORS  . Atorvastatin Other (See Comments)    Muscle weakness Muscle Aches  . Codeine Nausea And Vomiting  . Penicillins Itching and Other (See Comments)    Made nose run uncontrollably Has patient had a PCN reaction causing immediate rash, facial/tongue/throat swelling, SOB or lightheadedness with hypotension: No Has patient had a PCN reaction causing severe rash involving mucus membranes or skin necrosis: No Has patient had a PCN reaction that required hospitalization No Has patient had a PCN reaction occurring within the last 10 years: No If all of the above answers are "NO", then may proceed with Cephalosporin use.  . Simvastatin Other (See Comments)    Muscle weakness  . Byetta 10 Mcg Pen  [Exenatide] Nausea Only    MEDICATIONS: Current Outpatient Medications on File Prior to Visit  Medication Sig Dispense Refill  . beta carotene w/minerals (OCUVITE) tablet Take 1 tablet by mouth daily.    . Calcium-Vitamin D-Vitamin K (VIACTIV PO) Take by mouth.    . Cholecalciferol (VITAMIN D3 PO) Take by mouth.    . Coenzyme Q10 (CO Q-10) 400 MG CAPS Take by mouth.    . gabapentin (NEURONTIN) 300 MG capsule Take 2 capsules (600 mg total) by mouth at bedtime. 180 capsule 4  . iron polysaccharides (NIFEREX) 150 MG capsule Take 150 mg by mouth daily.    . magnesium oxide (MAG-OX) 400 MG tablet Take 400 mg by mouth at bedtime.     . metFORMIN (GLUCOPHAGE) 500 MG tablet Take 1 tablet (500 mg total) by mouth daily with breakfast. 90 tablet 0  . OVER THE COUNTER MEDICATION P5P vitamin    . rosuvastatin (CRESTOR) 20 MG tablet Take 1 tablet (20 mg total) by mouth daily. (Patient taking differently: Take 20 mg by mouth at bedtime. ) 30 tablet 0  . XARELTO 20 MG TABS tablet Take 20 mg by mouth daily.     No current facility-administered medications on file prior to visit.     PAST MEDICAL HISTORY: Past Medical History:  Diagnosis Date  . Anemia   . Arthritis    "knees,  hips, possibly back" (04/08/2016)  . Atrial septal aneurysm   . B12 deficiency    a. following gastric bypass.  . Back pain   . Chronic lower back pain    spondylosis  . Cold feet   . Daily headache    "short ones for the last month or 2" (04/08/2016)  . Depression   . Diabetes ([place])   . Dry mouth   . DVT (deep venous thrombosis) ([place])   . Easy bruising   . Floaters in visual field   . GERD (gastroesophageal reflux disease)    takes Protonix daily "just to protect my stomach; not for reflux" (04/08/2016)  . Hay fever   . Heart murmur    "dx'd by 1 anesthesiologist years and years ago"  . History of loop recorder 03/2016  . Hyperlipidemia   . Hypertension   . Joint pain   . Leg cramp   . Macular degeneration     "just watching"  . Neuropathy   . Nosebleed   . Palpitations   . Peripheral edema   . Peripheral neuropathy    not on any meds  . 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  [name] Pneumonia 12/2017  . Sleep apnea    no CPAP since weight loss after bariatric surgery '12 (04/08/2016)  . Spondylosis   . Stroke ([place]) 04/07/2016   a. 01/2016 and 03/2016. during admission had + LE DVT.  [name] Swelling of extremity   . TIA (transient ischemic attack)   . Trouble in sleeping   . Urinary incontinence   . Weakness     PAST SURGICAL HISTORY: Past Surgical History:  Procedure Laterality Date  . CATARACT EXTRACTION W/ INTRAOCULAR LENS  IMPLANT, BILATERAL Bilateral ~ 2012  . COLONOSCOPY    . EP IMPLANTABLE DEVICE N/A 04/10/2016   Procedure: Loop Recorder Insertion;  Surgeon: Will Meredith Leeds, MD;  Location: [facility];  Service: Cardiovascular;  Laterality: N/A;  . ESOPHAGOGASTRODUODENOSCOPY     in the 70's  . FACIAL COSMETIC SURGERY    . JOINT REPLACEMENT    . 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 SINUS ENDO W/FUSION  02/28/2016   Procedure: ENDOSCOPIC SINUS SURGERY WITH NAVIGATION;  Surgeon: Ruby Cola, MD;  Location: [facility];  Service: ENT;;  . TEE WITHOUT CARDIOVERSION N/A 04/10/2016   Procedure: TRANSESOPHAGEAL ECHOCARDIOGRAM (TEE) POSSIBLE LOOP;  Surgeon: Sanda Klein, MD;  Location: [facility];  Service: Cardiovascular;  Laterality: N/A;  . TONSILLECTOMY  1957  . TOTAL HIP ARTHROPLASTY Right 2008  . TOTAL HIP REVISION Right 03/30/2016   Procedure: RIGHT TOTAL HIP REVISION;  Surgeon: Frederik Pear, MD;  Location: [facility];  Service: Orthopedics;  Laterality: Right;  . TOTAL KNEE ARTHROPLASTY Bilateral 2001  . VAGINAL HYSTERECTOMY     "left my ovaries"    SOCIAL HISTORY: Social History   Tobacco Use  . Smoking status: Former Smoker    Packs/day: 1.00    Years: 40.00    Pack years: 40.00    Types: Cigarettes    Quit date: 08/2006    Years since quitting: 12.6   . Smokeless tobacco: Never Used  Substance Use Topics  . Alcohol use: Yes    Alcohol/week: 0.0 standard drinks    Comment: 04/08/2016 "5-6 drinks/year'  . Drug use: No    Types: Marijuana    Comment: 04/08/2016 "did some marijuana in college"    FAMILY HISTORY: Family History  Problem Relation Age of Onset  .  Neuropathy Mother   . Hypertension Mother   . Hyperlipidemia Mother   . Thyroid disease Mother   . Prostate cancer Father   . Lymphoma Father   . Hypertension Father   . Hyperlipidemia Father   . Cancer Father   . Obesity Father   . Congestive Heart Failure Maternal Grandmother   . Lung cancer Maternal Grandfather     ROS: Review of Systems  Constitutional: Positive for weight loss.  Respiratory: Negative for shortness of breath.   Cardiovascular: Negative for palpitations.       Positive for leg and foot cramps  Gastrointestinal: Negative for nausea and vomiting.  Musculoskeletal:       Negative for muscle weakness Positive for ankle edema    PHYSICAL EXAM: Pt in no acute distress  RECENT LABS AND TESTS: BMET    Component Value Date/Time   NA 139 09/22/2018 1017   NA 139 04/29/2017 0944   K 4.3 09/22/2018 1017   K 3.9 04/29/2017 0944   CL 98 09/22/2018 1017   CO2 24 09/22/2018 1017   CO2 29 04/29/2017 0944   GLUCOSE 88 09/22/2018 1017   GLUCOSE 120 04/29/2017 0944   BUN 19 09/22/2018 1017   BUN 15.8 04/29/2017 0944   CREATININE 0.78 09/22/2018 1017   CREATININE 0.8 04/29/2017 0944   CALCIUM 9.4 09/22/2018 1017   CALCIUM 9.2 04/29/2017 0944   GFRNONAA 79 09/22/2018 1017   GFRAA 91 09/22/2018 1017   Lab Results  Component Value Date   HGBA1C 5.1 11/03/2018   HGBA1C 5.4 08/02/2018   HGBA1C 6.9 (H) 04/27/2018   HGBA1C 6.0 (H) 04/09/2016   HGBA1C 6.2 (H) 01/31/2016   Lab Results  Component Value Date   INSULIN 6.9 08/02/2018   INSULIN 7.0 04/27/2018   CBC    Component Value Date/Time   WBC 9.7 11/03/2018 1545   WBC 7.9 04/29/2017 0944    WBC 16.0 (H) 04/13/2016 1105   RBC 4.80 11/03/2018 1545   RBC 4.53 04/29/2017 0944   RBC 3.65 (L) 04/13/2016 1105   HGB 11.2 11/03/2018 1545   HGB 11.9 04/29/2017 0944   HCT 35.4 11/03/2018 1545   HCT 36.9 04/29/2017 0944   PLT 275 04/27/2018 0000   MCV 74 (L) 11/03/2018 1545   MCV 81.5 04/29/2017 0944   MCH 23.3 (L) 11/03/2018 1545   MCH 26.3 04/29/2017 0944   MCH 26.3 04/13/2016 1105   MCHC 31.6 11/03/2018 1545   MCHC 32.2 04/29/2017 0944   MCHC 31.5 04/13/2016 1105   RDW 15.3 11/03/2018 1545   RDW 14.2 04/29/2017 0944   LYMPHSABS 1.8 11/03/2018 1545   LYMPHSABS 1.7 04/29/2017 0944   MONOABS 0.6 04/29/2017 0944   EOSABS 1.0 (H) 11/03/2018 1545   BASOSABS 0.1 11/03/2018 1545   BASOSABS 0.0 04/29/2017 0944   Iron/TIBC/Ferritin/ %Sat    Component Value Date/Time   IRON 28 11/03/2018 1545   IRON 51 04/29/2017 0944   TIBC 335 11/03/2018 1545   TIBC 343 04/29/2017 0944   FERRITIN 34 11/03/2018 1545   FERRITIN 23 04/29/2017 0944   IRONPCTSAT 8 (LL) 11/03/2018 1545   IRONPCTSAT 15 (L) 04/29/2017 0944   IRONPCTSAT 12 (L) 05/29/2011 1436   Lipid Panel     Component Value Date/Time   CHOL 152 11/03/2018 1545   TRIG 109 11/03/2018 1545   HDL 61 11/03/2018 1545   CHOLHDL 2.7 04/09/2016 0155   VLDL 27 04/09/2016 0155   LDLCALC 69 11/03/2018 1545   Hepatic Function Panel  Component Value Date/Time   PROT 6.7 09/22/2018 1017   PROT 6.8 04/29/2017 0944   ALBUMIN 4.4 09/22/2018 1017   ALBUMIN 3.4 (L) 04/29/2017 0944   AST 20 09/22/2018 1017   AST 17 04/29/2017 0944   ALT 14 09/22/2018 1017   ALT 14 04/29/2017 0944   ALKPHOS 92 09/22/2018 1017   ALKPHOS 116 04/29/2017 0944   BILITOT 0.3 09/22/2018 1017   BILITOT 0.41 04/29/2017 0944      Ref. Range 11/03/2018 15:45  Vitamin D, 25-Hydroxy Latest Ref Range: 30.0 - 100.0 ng/mL 57.8    I, Doreene Nest, am acting as Location manager for [employer]. Owens Shark, DO  I have reviewed the above documentation for accuracy  and completeness, and I agree with the above. -Jearld Lesch, DO

## 2019-04-13 ENCOUNTER — Encounter (INDEPENDENT_AMBULATORY_CARE_PROVIDER_SITE_OTHER): Payer: Self-pay

## 2019-04-14 ENCOUNTER — Ambulatory Visit (INDEPENDENT_AMBULATORY_CARE_PROVIDER_SITE_OTHER): Payer: Medicare Other | Admitting: *Deleted

## 2019-04-14 DIAGNOSIS — I6340 Cerebral infarction due to embolism of unspecified cerebral artery: Secondary | ICD-10-CM | POA: Diagnosis not present

## 2019-04-15 LAB — CUP PACEART REMOTE DEVICE CHECK
Date Time Interrogation Session: 20200725020942
Implantable Pulse Generator Implant Date: 20170721

## 2019-04-17 ENCOUNTER — Telehealth (INDEPENDENT_AMBULATORY_CARE_PROVIDER_SITE_OTHER): Payer: Medicare Other | Admitting: Bariatrics

## 2019-04-17 ENCOUNTER — Other Ambulatory Visit: Payer: Self-pay

## 2019-04-17 DIAGNOSIS — E119 Type 2 diabetes mellitus without complications: Secondary | ICD-10-CM | POA: Diagnosis not present

## 2019-04-17 DIAGNOSIS — Z6835 Body mass index (BMI) 35.0-35.9, adult: Secondary | ICD-10-CM | POA: Diagnosis not present

## 2019-04-17 DIAGNOSIS — E7849 Other hyperlipidemia: Secondary | ICD-10-CM | POA: Diagnosis not present

## 2019-04-17 MED ORDER — METFORMIN HCL 500 MG PO TABS: 500.0000 mg | ORAL_TABLET | Freq: Every day | ORAL | 0 refills | Status: DC

## 2019-04-17 NOTE — Progress Notes (Signed)
Office: [phone]  /  Fax: [phone] TeleHealth Visit:  [name] [name] has verbally consented to this TeleHealth visit today. The patient is located at home, the provider is located at the [facility] office. The participants in this visit include the listed provider and patient. The visit was conducted today via FaceTime.  HPI:   Chief Complaint: OBESITY [name] [name] is here to discuss her progress with her obesity treatment plan. She is on the Category 2 plan and is following her eating plan approximately 80% of the time. She states she is exercising with weight machines 60 minutes 5 times per week. [name] [name] states that she has lost a "little over 3 lbs." She states she got back to the gym "twice." She reports she is doing well with her water intake.  We were unable to weigh the patient today for this TeleHealth visit. She feels as if she has lost weight since her last visit. She has lost 48 lbs since starting treatment with [place].  Diabetes II [name] [name] has a diagnosis of diabetes type II. [name] [name] does not report checking her blood sugars. She is doing good and has no need for testing. Last A1c was 5.1 on 11/03/2018.She has been working on intensive lifestyle modifications including diet, exercise, and weight loss to help control her blood glucose levels.  Hyperlipidemia, Other [name] [name] has hyperlipidemia and has been trying to improve her cholesterol levels with intensive lifestyle modification including a low saturated fat diet, exercise and weight loss. She is taking Crestor and denies any myalgias.  ASSESSMENT AND PLAN:  Other hyperlipidemia  Type 2 diabetes mellitus without complication, without long-term current use of insulin ([place]) - Plan: metFORMIN (GLUCOPHAGE) 500 MG tablet  Class 2 severe obesity with serious comorbidity and body mass index (BMI) of 35.0 to 35.9 in adult, unspecified obesity type ([place])  PLAN:  Diabetes II [name] [name] has been given extensive diabetes  education by myself today including ideal fasting and post-prandial blood glucose readings, individual ideal HgA1c goals  and hypoglycemia prevention. We discussed the importance of good blood sugar control to decrease the likelihood of diabetic complications such as nephropathy, neuropathy, limb loss, blindness, coronary artery disease, and death. We discussed the importance of intensive lifestyle modification including diet, exercise and weight loss as the first line treatment for diabetes. [name] [name] was given a refill on her metformin 500 mg 1 PO a day with breakfast #90 with 0 refills. She agrees to follow-up with our clinic in 2 weeks.   Hyperlipidemia, Other [name] [name] was informed of the American Heart Association Guidelines emphasizing intensive lifestyle modifications as the first line treatment for hyperlipidemia. We discussed many lifestyle modifications today in depth, and [name] [name] will continue to work on decreasing saturated fats such as fatty red meat, butter and many fried foods. [name] [name] will decrease saturated fats, increase MUFA's and PUFA's. She will also increase vegetables and lean protein in her diet and continue to work on exercise and weight loss efforts.  Obesity [name] [name] is currently in the action stage of change. As such, her goal is to continue with weight loss efforts. She has agreed to follow the Category 2 plan. [name] [name] has been instructed to continue her current exercise regimen for weight loss and overall health benefits. We discussed the following Behavioral Modification Strategies today: increasing lean protein intake, decreasing simple carbohydrates, increasing vegetables, increase H20 intake, decrease eating out, no skipping meals, work on meal planning and easy cooking plans, and keeping healthy foods in the home.  [name] [name]  has agreed to follow-up with our clinic in 2 weeks. She was informed of the importance of frequent follow-up visits to maximize her success with intensive  lifestyle modifications for her multiple health conditions.  ALLERGIES: Allergies  Allergen Reactions  . Ambien [Zolpidem] Other (See Comments)    NIGHT TERRORS  . Atorvastatin Other (See Comments)    Muscle weakness Muscle Aches  . Codeine Nausea And Vomiting  . Penicillins Itching and Other (See Comments)    Made nose run uncontrollably Has patient had a PCN reaction causing immediate rash, facial/tongue/throat swelling, SOB or lightheadedness with hypotension: No Has patient had a PCN reaction causing severe rash involving mucus membranes or skin necrosis: No Has patient had a PCN reaction that required hospitalization No Has patient had a PCN reaction occurring within the last 10 years: No If all of the above answers are "NO", then may proceed with Cephalosporin use.  . Simvastatin Other (See Comments)    Muscle weakness  . Byetta 10 Mcg Pen [Exenatide] Nausea Only    MEDICATIONS: Current Outpatient Medications on File Prior to Visit  Medication Sig Dispense Refill  . beta carotene w/minerals (OCUVITE) tablet Take 1 tablet by mouth daily.    . Calcium-Vitamin D-Vitamin K (VIACTIV PO) Take by mouth.    . Cholecalciferol (VITAMIN D3 PO) Take by mouth.    . Coenzyme Q10 (CO Q-10) 400 MG CAPS Take by mouth.    . gabapentin (NEURONTIN) 300 MG capsule Take 2 capsules (600 mg total) by mouth at bedtime. 180 capsule 4  . iron polysaccharides (NIFEREX) 150 MG capsule Take 150 mg by mouth daily.    . magnesium oxide (MAG-OX) 400 MG tablet Take 400 mg by mouth at bedtime.     [name] OVER THE COUNTER MEDICATION P5P vitamin    . rosuvastatin (CRESTOR) 20 MG tablet Take 1 tablet (20 mg total) by mouth daily. (Patient taking differently: Take 20 mg by mouth at bedtime. ) 30 tablet 0  . XARELTO 20 MG TABS tablet Take 20 mg by mouth daily.     No current facility-administered medications on file prior to visit.     PAST MEDICAL HISTORY: Past Medical History:  Diagnosis Date  . Anemia   .  Arthritis    "knees, hips, possibly back" (04/08/2016)  . Atrial septal aneurysm   . B12 deficiency    a. following gastric bypass.  . Back pain   . Chronic lower back pain    spondylosis  . Cold feet   . Daily headache    "short ones for the last month or 2" (04/08/2016)  . Depression   . Diabetes ([place])   . Dry mouth   . DVT (deep venous thrombosis) ([place])   . Easy bruising   . Floaters in visual field   . GERD (gastroesophageal reflux disease)    takes Protonix daily "just to protect my stomach; not for reflux" (04/08/2016)  . Hay fever   . Heart murmur    "dx'd by 1 anesthesiologist years and years ago"  . History of loop recorder 03/2016  . Hyperlipidemia   . Hypertension   . Joint pain   . Leg cramp   . Macular degeneration    "just watching"  . Neuropathy   . Nosebleed   . Palpitations   . Peripheral edema   . Peripheral neuropathy    not on any meds  . 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  [name] Pneumonia  12/2017  . Sleep apnea    no CPAP since weight loss after bariatric surgery '12 (04/08/2016)  . Spondylosis   . Stroke ([place]) 04/07/2016   a. 01/2016 and 03/2016. during admission had + LE DVT.  [name] Swelling of extremity   . TIA (transient ischemic attack)   . Trouble in sleeping   . Urinary incontinence   . Weakness     PAST SURGICAL HISTORY: Past Surgical History:  Procedure Laterality Date  . CATARACT EXTRACTION W/ INTRAOCULAR LENS  IMPLANT, BILATERAL Bilateral ~ 2012  . COLONOSCOPY    . EP IMPLANTABLE DEVICE N/A 04/10/2016   Procedure: Loop Recorder Insertion;  Surgeon: Will Meredith Leeds, MD;  Location: [facility];  Service: Cardiovascular;  Laterality: N/A;  . ESOPHAGOGASTRODUODENOSCOPY     in the 70's  . FACIAL COSMETIC SURGERY    . JOINT REPLACEMENT    . 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 SINUS ENDO W/FUSION  02/28/2016   Procedure: ENDOSCOPIC SINUS SURGERY WITH NAVIGATION;  Surgeon: Ruby Cola, MD;  Location: [facility];   Service: ENT;;  . TEE WITHOUT CARDIOVERSION N/A 04/10/2016   Procedure: TRANSESOPHAGEAL ECHOCARDIOGRAM (TEE) POSSIBLE LOOP;  Surgeon: Sanda Klein, MD;  Location: [facility];  Service: Cardiovascular;  Laterality: N/A;  . TONSILLECTOMY  1957  . TOTAL HIP ARTHROPLASTY Right 2008  . TOTAL HIP REVISION Right 03/30/2016   Procedure: RIGHT TOTAL HIP REVISION;  Surgeon: Frederik Pear, MD;  Location: [facility];  Service: Orthopedics;  Laterality: Right;  . TOTAL KNEE ARTHROPLASTY Bilateral 2001  . VAGINAL HYSTERECTOMY     "left my ovaries"    SOCIAL HISTORY: Social History   Tobacco Use  . Smoking status: Former Smoker    Packs/day: 1.00    Years: 40.00    Pack years: 40.00    Types: Cigarettes    Quit date: 08/2006    Years since quitting: 12.6  . Smokeless tobacco: Never Used  Substance Use Topics  . Alcohol use: Yes    Alcohol/week: 0.0 standard drinks    Comment: 04/08/2016 "5-6 drinks/year'  . Drug use: No    Types: Marijuana    Comment: 04/08/2016 "did some marijuana in college"    FAMILY HISTORY: Family History  Problem Relation Age of Onset  . Neuropathy Mother   . Hypertension Mother   . Hyperlipidemia Mother   . Thyroid disease Mother   . Prostate cancer Father   . Lymphoma Father   . Hypertension Father   . Hyperlipidemia Father   . Cancer Father   . Obesity Father   . Congestive Heart Failure Maternal Grandmother   . Lung cancer Maternal Grandfather    ROS: Review of Systems  Musculoskeletal: Negative for myalgias.   PHYSICAL EXAM: Pt in no acute distress  RECENT LABS AND TESTS: BMET    Component Value Date/Time   NA 139 09/22/2018 1017   NA 139 04/29/2017 0944   K 4.3 09/22/2018 1017   K 3.9 04/29/2017 0944   CL 98 09/22/2018 1017   CO2 24 09/22/2018 1017   CO2 29 04/29/2017 0944   GLUCOSE 88 09/22/2018 1017   GLUCOSE 120 04/29/2017 0944   BUN 19 09/22/2018 1017   BUN 15.8 04/29/2017 0944   CREATININE 0.78 09/22/2018 1017   CREATININE 0.8  04/29/2017 0944   CALCIUM 9.4 09/22/2018 1017   CALCIUM 9.2 04/29/2017 0944   GFRNONAA 79 09/22/2018 1017   GFRAA 91 09/22/2018 1017   Lab Results  Component Value Date  HGBA1C 5.1 11/03/2018   HGBA1C 5.4 08/02/2018   HGBA1C 6.9 (H) 04/27/2018   HGBA1C 6.0 (H) 04/09/2016   HGBA1C 6.2 (H) 01/31/2016   Lab Results  Component Value Date   INSULIN 6.9 08/02/2018   INSULIN 7.0 04/27/2018   CBC    Component Value Date/Time   WBC 9.7 11/03/2018 1545   WBC 7.9 04/29/2017 0944   WBC 16.0 (H) 04/13/2016 1105   RBC 4.80 11/03/2018 1545   RBC 4.53 04/29/2017 0944   RBC 3.65 (L) 04/13/2016 1105   HGB 11.2 11/03/2018 1545   HGB 11.9 04/29/2017 0944   HCT 35.4 11/03/2018 1545   HCT 36.9 04/29/2017 0944   PLT 275 04/27/2018 0000   MCV 74 (L) 11/03/2018 1545   MCV 81.5 04/29/2017 0944   MCH 23.3 (L) 11/03/2018 1545   MCH 26.3 04/29/2017 0944   MCH 26.3 04/13/2016 1105   MCHC 31.6 11/03/2018 1545   MCHC 32.2 04/29/2017 0944   MCHC 31.5 04/13/2016 1105   RDW 15.3 11/03/2018 1545   RDW 14.2 04/29/2017 0944   LYMPHSABS 1.8 11/03/2018 1545   LYMPHSABS 1.7 04/29/2017 0944   MONOABS 0.6 04/29/2017 0944   EOSABS 1.0 (H) 11/03/2018 1545   BASOSABS 0.1 11/03/2018 1545   BASOSABS 0.0 04/29/2017 0944   Iron/TIBC/Ferritin/ %Sat    Component Value Date/Time   IRON 28 11/03/2018 1545   IRON 51 04/29/2017 0944   TIBC 335 11/03/2018 1545   TIBC 343 04/29/2017 0944   FERRITIN 34 11/03/2018 1545   FERRITIN 23 04/29/2017 0944   IRONPCTSAT 8 (LL) 11/03/2018 1545   IRONPCTSAT 15 (L) 04/29/2017 0944   IRONPCTSAT 12 (L) 05/29/2011 1436   Lipid Panel     Component Value Date/Time   CHOL 152 11/03/2018 1545   TRIG 109 11/03/2018 1545   HDL 61 11/03/2018 1545   CHOLHDL 2.7 04/09/2016 0155   VLDL 27 04/09/2016 0155   LDLCALC 69 11/03/2018 1545   Hepatic Function Panel     Component Value Date/Time   PROT 6.7 09/22/2018 1017   PROT 6.8 04/29/2017 0944   ALBUMIN 4.4 09/22/2018 1017    ALBUMIN 3.4 (L) 04/29/2017 0944   AST 20 09/22/2018 1017   AST 17 04/29/2017 0944   ALT 14 09/22/2018 1017   ALT 14 04/29/2017 0944   ALKPHOS 92 09/22/2018 1017   ALKPHOS 116 04/29/2017 0944   BILITOT 0.3 09/22/2018 1017   BILITOT 0.41 04/29/2017 0944    Results for LYANNE, KATES (MRN [id number]) as of 04/17/2019 10:46  Ref. Range 11/03/2018 15:45  Vitamin D, 25-Hydroxy Latest Ref Range: 30.0 - 100.0 ng/mL 57.8   I, Michaelene Song, am acting as Location manager for [employer], DO  I have reviewed the above documentation for accuracy and completeness, and I agree with the above. -Jearld Lesch, DO

## 2019-04-21 NOTE — Progress Notes (Signed)
[facility] Summary Report / Loop Recorder

## 2019-05-04 ENCOUNTER — Encounter (INDEPENDENT_AMBULATORY_CARE_PROVIDER_SITE_OTHER): Payer: Self-pay | Admitting: Family Medicine

## 2019-05-04 ENCOUNTER — Other Ambulatory Visit: Payer: Self-pay

## 2019-05-04 ENCOUNTER — Ambulatory Visit (INDEPENDENT_AMBULATORY_CARE_PROVIDER_SITE_OTHER): Payer: Medicare Other | Admitting: Family Medicine

## 2019-05-04 VITALS — BP 136/78 | HR 70 | Temp 98.2°F | Ht 63.0 in | Wt 179.0 lb

## 2019-05-04 DIAGNOSIS — E119 Type 2 diabetes mellitus without complications: Secondary | ICD-10-CM

## 2019-05-04 DIAGNOSIS — R5383 Other fatigue: Secondary | ICD-10-CM

## 2019-05-04 DIAGNOSIS — D508 Other iron deficiency anemias: Secondary | ICD-10-CM

## 2019-05-04 DIAGNOSIS — G609 Hereditary and idiopathic neuropathy, unspecified: Secondary | ICD-10-CM

## 2019-05-04 DIAGNOSIS — Z6831 Body mass index (BMI) 31.0-31.9, adult: Secondary | ICD-10-CM

## 2019-05-04 DIAGNOSIS — R600 Localized edema: Secondary | ICD-10-CM

## 2019-05-04 DIAGNOSIS — E669 Obesity, unspecified: Secondary | ICD-10-CM | POA: Diagnosis not present

## 2019-05-04 DIAGNOSIS — E559 Vitamin D deficiency, unspecified: Secondary | ICD-10-CM

## 2019-05-04 MED ORDER — HYDROCHLOROTHIAZIDE 12.5 MG PO TABS: 12.5000 mg | ORAL_TABLET | Freq: Every day | ORAL | 0 refills | Status: DC

## 2019-05-04 MED ORDER — METFORMIN HCL 500 MG PO TABS: 500.0000 mg | ORAL_TABLET | Freq: Every day | ORAL | 0 refills | Status: DC

## 2019-05-04 NOTE — Telephone Encounter (Signed)
Please review

## 2019-05-08 ENCOUNTER — Encounter (INDEPENDENT_AMBULATORY_CARE_PROVIDER_SITE_OTHER): Payer: Self-pay | Admitting: Family Medicine

## 2019-05-08 DIAGNOSIS — G609 Hereditary and idiopathic neuropathy, unspecified: Secondary | ICD-10-CM | POA: Insufficient documentation

## 2019-05-08 DIAGNOSIS — R600 Localized edema: Secondary | ICD-10-CM | POA: Insufficient documentation

## 2019-05-08 LAB — COMPREHENSIVE METABOLIC PANEL
ALT: 16 IU/L (ref 0–32)
AST: 23 IU/L (ref 0–40)
Albumin/Globulin Ratio: 2.1 (ref 1.2–2.2)
Albumin: 4.4 g/dL (ref 3.8–4.8)
Alkaline Phosphatase: 65 IU/L (ref 39–117)
BUN/Creatinine Ratio: 20 (ref 12–28)
BUN: 17 mg/dL (ref 8–27)
Bilirubin Total: 0.3 mg/dL (ref 0.0–1.2)
CO2: 27 mmol/L (ref 20–29)
Calcium: 9.6 mg/dL (ref 8.7–10.3)
Chloride: 100 mmol/L (ref 96–106)
Creatinine, Ser: 0.84 mg/dL (ref 0.57–1.00)
GFR calc Af Amer: 83 mL/min/{1.73_m2} (ref >59)
GFR calc non Af Amer: 72 mL/min/{1.73_m2} (ref >59)
Globulin, Total: 2.1 g/dL (ref 1.5–4.5)
Glucose: 89 mg/dL (ref 65–99)
Potassium: 4.7 mmol/L (ref 3.5–5.2)
Sodium: 142 mmol/L (ref 134–144)
Total Protein: 6.5 g/dL (ref 6.0–8.5)

## 2019-05-08 LAB — CBC WITH DIFFERENTIAL
Basophils Absolute: 0.1 10*3/uL (ref 0.0–0.2)
Basos: 1 %
EOS (ABSOLUTE): 0.1 10*3/uL (ref 0.0–0.4)
Eos: 2 %
Hematocrit: 43.1 % (ref 34.0–46.6)
Hemoglobin: 13.5 g/dL (ref 11.1–15.9)
Immature Grans (Abs): 0 10*3/uL (ref 0.0–0.1)
Immature Granulocytes: 0 %
Lymphocytes Absolute: 1.1 10*3/uL (ref 0.7–3.1)
Lymphs: 16 %
MCH: 27.2 pg (ref 26.6–33.0)
MCHC: 31.3 g/dL — ABNORMAL LOW (ref 31.5–35.7)
MCV: 87 fL (ref 79–97)
Monocytes Absolute: 0.4 10*3/uL (ref 0.1–0.9)
Monocytes: 7 %
Neutrophils Absolute: 4.9 10*3/uL (ref 1.4–7.0)
Neutrophils: 74 %
RBC: 4.97 x10E6/uL (ref 3.77–5.28)
RDW: 18 % — ABNORMAL HIGH (ref 11.7–15.4)
WBC: 6.6 10*3/uL (ref 3.4–10.8)

## 2019-05-08 LAB — T3: T3, Total: 91 ng/dL (ref 71–180)

## 2019-05-08 LAB — VITAMIN B12: Vitamin B-12: 1393 pg/mL — ABNORMAL HIGH (ref 232–1245)

## 2019-05-08 LAB — HEMOGLOBIN A1C
Est. average glucose Bld gHb Est-mCnc: 91 mg/dL
Hgb A1c MFr Bld: 4.8 % (ref 4.8–5.6)

## 2019-05-08 LAB — TSH: TSH: 2.1 u[IU]/mL (ref 0.450–4.500)

## 2019-05-08 LAB — VITAMIN D 25 HYDROXY (VIT D DEFICIENCY, FRACTURES): Vit D, 25-Hydroxy: 50.7 ng/mL (ref 30.0–100.0)

## 2019-05-08 LAB — T4, FREE: Free T4: 1.33 ng/dL (ref 0.82–1.77)

## 2019-05-08 LAB — MAGNESIUM: Magnesium: 2.1 mg/dL (ref 1.6–2.3)

## 2019-05-08 LAB — VITAMIN B1: Thiamine: 75.1 nmol/L (ref 66.5–200.0)

## 2019-05-08 NOTE — Progress Notes (Signed)
Office: [phone]  /  Fax: [phone]   HPI:   Chief Complaint: OBESITY [name] [name] is here to discuss her progress with her obesity treatment plan. She is on the Category 2 plan and is following her eating plan approximately 60% of the time. She states she is doing cardio/weight training 60-90 minutes 6 times per week. [name] [name] has not felt well recently overall and she is unsure why.   She has done fairly well with her plan. She states her weight goal is 155-165 lbs. Her weight is 179 lb (81.2 kg) today and has had a weight loss of 31 pounds over a period of 6 months since her last visit. She has lost 79 lbs since starting treatment with [place].  Lower Extremity Edema [name] [name] notes increased ankle swelling recently. She used to take HCTZ which helped with the edema.  Peripheral Neuropathy [name] [name] reports leg cramps and worsening peripheral neuropathy. 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   Fatigue [name] [name] reports fatigue and hair thinning. She also reports heat intolerance.  Vitamin D deficiency [name] [name] has a diagnosis of Vitamin D deficiency. She is currently taking OTC Vit D3 (unsure of dose) and denies nausea, vomiting or muscle weakness.  Iron Deficiency Anemia [name] [name] is on prescription iron. She has not felt well recently and reports fatigue.  Diabetes Mellitus [name] [name] has a diagnosis of diabetes mellitus, which is well controlled on metformin. [name] [name] does not report checking her blood sugars. Last A1c was 5.1 on 11/03/2018. She has been working on intensive lifestyle modifications including diet, exercise, and weight loss to help control her blood glucose levels.  ASSESSMENT AND PLAN:  Lower extremity edema - Plan: Magnesium, hydrochlorothiazide (HYDRODIURIL) 12.5 MG tablet  Other fatigue - Plan: T3, T4, free, TSH  Vitamin D deficiency - Plan: VITAMIN D 25 Hydroxy (Vit-D Deficiency, Fractures), Magnesium  Other iron deficiency anemia - Plan: CBC With  Differential  Type 2 diabetes mellitus without complication, without long-term current use of insulin (HCC) - Plan: Hemoglobin A1c, Comprehensive metabolic panel, metFORMIN (GLUCOPHAGE) 500 MG tablet  Idiopathic peripheral neuropathy - Plan: Vitamin B12, Magnesium, Vitamin B1  Class 1 obesity with serious comorbidity and body mass index (BMI) of 31.0 to 31.9 in adult, unspecified obesity type  PLAN:  Lower Extremity Edema [name] [name] was given a new prescription for HCTZ 12.5 mg daily #90 with 0 refills and agrees to follow-up with our clinic in 2 weeks.  Peripheral Neuropathy [name] [name] will have B12, thiamine, magnesium, and CMET checked today.  Fatigue [name] [name] was informed that her fatigue may be related to obesity, depression or many other causes. Labs will be ordered, and in the meanwhile [name] [name] has agreed to work on diet, exercise and weight loss to help with fatigue. Proper sleep hygiene was discussed including the need for 7-8 hours of quality sleep each night. [name] [name] will have thyroid panel checked today.  Vitamin D Deficiency [name] [name] was informed that low Vitamin D levels contributes to fatigue and are associated with obesity, breast, and colon cancer. She agrees to continue taking OTC Vit D and will follow-up for routine testing of Vitamin D, at least 2-3 times per year. She was informed of the risk of over-replacement of Vitamin D and agrees to not increase her dose unless she discusses this with [place] first. [name] [name] agrees to follow-up with our clinic in 2 weeks.  Iron Deficiency Anemia [name] [name] will have CBC and anemia panel checked today.  Diabetes Mellitus [name] [name] has been given extensive diabetes education by  myself today including ideal fasting and post-prandial blood glucose readings, individual ideal HgA1c goals  and hypoglycemia prevention. We discussed the importance of good blood sugar control to decrease the likelihood of diabetic complications such as nephropathy, neuropathy, limb  loss, blindness, coronary artery disease, and death. We discussed the importance of intensive lifestyle modification including diet, exercise and weight loss as the first line treatment for diabetes. [name] [name] was given a refill on her metformin 500 mg daily #90 with 0 refills and agrees to follow-up with our clinic in 2 weeks.  Obesity [name] [name] is currently in the action stage of change. As such, her goal is to continue with weight loss efforts. She has agreed to follow the Category 2 plan. [name] [name] has been instructed to work up to a goal of 150 minutes of combined cardio and strengthening exercise per week for weight loss and overall health benefits. We discussed the following Behavioral Modification Strategies today: increasing lean protein intake, decreasing sodium intake, no skipping meals, and planning for success.  [name] [name] has agreed to follow-up with our clinic in 2 weeks. She was informed of the importance of frequent follow-up visits to maximize her success with intensive lifestyle modifications for her multiple health conditions.  ALLERGIES: Allergies  Allergen Reactions  . Ambien [Zolpidem] Other (See Comments)    NIGHT TERRORS  . Atorvastatin Other (See Comments)    Muscle weakness Muscle Aches  . Codeine Nausea And Vomiting  . Penicillins Itching and Other (See Comments)    Made nose run uncontrollably Has patient had a PCN reaction causing immediate rash, facial/tongue/throat swelling, SOB or lightheadedness with hypotension: No Has patient had a PCN reaction causing severe rash involving mucus membranes or skin necrosis: No Has patient had a PCN reaction that required hospitalization No Has patient had a PCN reaction occurring within the last 10 years: No If all of the above answers are "NO", then may proceed with Cephalosporin use.  . Simvastatin Other (See Comments)    Muscle weakness  . Byetta 10 Mcg Pen [Exenatide] Nausea Only    MEDICATIONS: Current Outpatient  Medications on File Prior to Visit  Medication Sig Dispense Refill  . cholecalciferol (VITAMIN D3) 25 MCG (1000 UT) tablet Take 5,000 Units by mouth daily.    . beta carotene w/minerals (OCUVITE) tablet Take 1 tablet by mouth daily.    . Calcium-Vitamin D-Vitamin K (VIACTIV PO) Take by mouth.    . Coenzyme Q10 (CO Q-10) 400 MG CAPS Take by mouth.    . gabapentin (NEURONTIN) 300 MG capsule Take 2 capsules (600 mg total) by mouth at bedtime. 180 capsule 4  . iron polysaccharides (NIFEREX) 150 MG capsule Take 150 mg by mouth daily.    . magnesium oxide (MAG-OX) 400 MG tablet Take 400 mg by mouth at bedtime.     [name] OVER THE COUNTER MEDICATION P5P vitamin    . rosuvastatin (CRESTOR) 20 MG tablet Take 1 tablet (20 mg total) by mouth daily. (Patient taking differently: Take 20 mg by mouth at bedtime. ) 30 tablet 0  . XARELTO 20 MG TABS tablet Take 20 mg by mouth daily.     No current facility-administered medications on file prior to visit.     PAST MEDICAL HISTORY: Past Medical History:  Diagnosis Date  . Anemia   . Arthritis    "knees, hips, possibly back" (04/08/2016)  . Atrial septal aneurysm   . B12 deficiency    a. following gastric bypass.  . Back pain   .  Chronic lower back pain    spondylosis  . Cold feet   . Daily headache    "short ones for the last month or 2" (04/08/2016)  . Depression   . Diabetes ([place])   . Dry mouth   . DVT (deep venous thrombosis) ([place])   . Easy bruising   . Floaters in visual field   . GERD (gastroesophageal reflux disease)    takes Protonix daily "just to protect my stomach; not for reflux" (04/08/2016)  . Hay fever   . Heart murmur    "dx'd by 1 anesthesiologist years and years ago"  . History of loop recorder 03/2016  . Hyperlipidemia   . Hypertension   . Joint pain   . Leg cramp   . Macular degeneration    "just watching"  . Neuropathy   . Nosebleed   . Palpitations   . Peripheral edema   . Peripheral neuropathy    not on any meds  .  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  [name] Pneumonia 12/2017  . Sleep apnea    no CPAP since weight loss after bariatric surgery '12 (04/08/2016)  . Spondylosis   . Stroke ([place]) 04/07/2016   a. 01/2016 and 03/2016. during admission had + LE DVT.  [name] Swelling of extremity   . TIA (transient ischemic attack)   . Trouble in sleeping   . Urinary incontinence   . Weakness     PAST SURGICAL HISTORY: Past Surgical History:  Procedure Laterality Date  . CATARACT EXTRACTION W/ INTRAOCULAR LENS  IMPLANT, BILATERAL Bilateral ~ 2012  . COLONOSCOPY    . EP IMPLANTABLE DEVICE N/A 04/10/2016   Procedure: Loop Recorder Insertion;  Surgeon: Will Meredith Leeds, MD;  Location: [facility];  Service: Cardiovascular;  Laterality: N/A;  . ESOPHAGOGASTRODUODENOSCOPY     in the 70's  . FACIAL COSMETIC SURGERY    . JOINT REPLACEMENT    . 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 SINUS ENDO W/FUSION  02/28/2016   Procedure: ENDOSCOPIC SINUS SURGERY WITH NAVIGATION;  Surgeon: Ruby Cola, MD;  Location: [facility];  Service: ENT;;  . TEE WITHOUT CARDIOVERSION N/A 04/10/2016   Procedure: TRANSESOPHAGEAL ECHOCARDIOGRAM (TEE) POSSIBLE LOOP;  Surgeon: Sanda Klein, MD;  Location: [facility];  Service: Cardiovascular;  Laterality: N/A;  . TONSILLECTOMY  1957  . TOTAL HIP ARTHROPLASTY Right 2008  . TOTAL HIP REVISION Right 03/30/2016   Procedure: RIGHT TOTAL HIP REVISION;  Surgeon: Frederik Pear, MD;  Location: [facility];  Service: Orthopedics;  Laterality: Right;  . TOTAL KNEE ARTHROPLASTY Bilateral 2001  . VAGINAL HYSTERECTOMY     "left my ovaries"    SOCIAL HISTORY: Social History   Tobacco Use  . Smoking status: Former Smoker    Packs/day: 1.00    Years: 40.00    Pack years: 40.00    Types: Cigarettes    Quit date: 08/2006    Years since quitting: 12.7  . Smokeless tobacco: Never Used  Substance Use Topics  . Alcohol use: Yes    Alcohol/week: 0.0 standard drinks    Comment: 04/08/2016  "5-6 drinks/year'  . Drug use: No    Types: Marijuana    Comment: 04/08/2016 "did some marijuana in college"    FAMILY HISTORY: Family History  Problem Relation Age of Onset  . Neuropathy Mother   . Hypertension Mother   . Hyperlipidemia Mother   . Thyroid disease Mother   . Prostate cancer Father   .  Lymphoma Father   . Hypertension Father   . Hyperlipidemia Father   . Cancer Father   . Obesity Father   . Congestive Heart Failure Maternal Grandmother   . Lung cancer Maternal Grandfather    ROS: Review of Systems  Constitutional: Positive for malaise/fatigue.  Cardiovascular: Positive for leg swelling (ankle swelling).  Gastrointestinal: Negative for nausea and vomiting.  Musculoskeletal:       Negative for muscle weakness.  Neurological:       Positive for leg cramps. Positive for peripheral neuropathy. Positive for hair thinning.  Endo/Heme/Allergies:       Positive for heat intolerance.   PHYSICAL EXAM: Blood pressure 136/78, pulse 70, temperature 98.2 F (36.8 C), temperature source Oral, height _0  (1.6 m), weight 179 lb (81.2 kg), SpO2 98 %. Body mass index is 31.71 kg/m. Physical Exam Vitals signs reviewed.  Constitutional:      Appearance: Normal appearance. She is obese.  Cardiovascular:     Rate and Rhythm: Normal rate.     Pulses: Normal pulses.  Pulmonary:     Effort: Pulmonary effort is normal.     Breath sounds: Normal breath sounds.  Musculoskeletal: Normal range of motion.  Skin:    General: Skin is warm and dry.  Neurological:     Mental Status: She is alert and oriented to person, place, and time.  Psychiatric:        Behavior: Behavior normal.   RECENT LABS AND TESTS: BMET    Component Value Date/Time   NA 142 05/04/2019 1025   NA 139 04/29/2017 0944   K 4.7 05/04/2019 1025   K 3.9 04/29/2017 0944   CL 100 05/04/2019 1025   CO2 27 05/04/2019 1025   CO2 29 04/29/2017 0944   GLUCOSE 89 05/04/2019 1025   GLUCOSE 120 04/29/2017  0944   BUN 17 05/04/2019 1025   BUN 15.8 04/29/2017 0944   CREATININE 0.84 05/04/2019 1025   CREATININE 0.8 04/29/2017 0944   CALCIUM 9.6 05/04/2019 1025   CALCIUM 9.2 04/29/2017 0944   GFRNONAA 72 05/04/2019 1025   GFRAA 83 05/04/2019 1025   Lab Results  Component Value Date   HGBA1C 4.8 05/04/2019   HGBA1C 5.1 11/03/2018   HGBA1C 5.4 08/02/2018   HGBA1C 6.9 (H) 04/27/2018   HGBA1C 6.0 (H) 04/09/2016   Lab Results  Component Value Date   INSULIN 6.9 08/02/2018   INSULIN 7.0 04/27/2018   CBC    Component Value Date/Time   WBC 6.6 05/04/2019 1025   WBC 7.9 04/29/2017 0944   WBC 16.0 (H) 04/13/2016 1105   RBC 4.97 05/04/2019 1025   RBC 4.53 04/29/2017 0944   RBC 3.65 (L) 04/13/2016 1105   HGB 13.5 05/04/2019 1025   HGB 11.9 04/29/2017 0944   HCT 43.1 05/04/2019 1025   HCT 36.9 04/29/2017 0944   PLT 275 04/27/2018 0000   MCV 87 05/04/2019 1025   MCV 81.5 04/29/2017 0944   MCH 27.2 05/04/2019 1025   MCH 26.3 04/29/2017 0944   MCH 26.3 04/13/2016 1105   MCHC 31.3 (L) 05/04/2019 1025   MCHC 32.2 04/29/2017 0944   MCHC 31.5 04/13/2016 1105   RDW 18.0 (H) 05/04/2019 1025   RDW 14.2 04/29/2017 0944   LYMPHSABS 1.1 05/04/2019 1025   LYMPHSABS 1.7 04/29/2017 0944   MONOABS 0.6 04/29/2017 0944   EOSABS 0.1 05/04/2019 1025   BASOSABS 0.1 05/04/2019 1025   BASOSABS 0.0 04/29/2017 0944   Iron/TIBC/Ferritin/ %Sat    Component Value  Date/Time   IRON 28 11/03/2018 1545   IRON 51 04/29/2017 0944   TIBC 335 11/03/2018 1545   TIBC 343 04/29/2017 0944   FERRITIN 34 11/03/2018 1545   FERRITIN 23 04/29/2017 0944   IRONPCTSAT 8 (LL) 11/03/2018 1545   IRONPCTSAT 15 (L) 04/29/2017 0944   IRONPCTSAT 12 (L) 05/29/2011 1436   Lipid Panel     Component Value Date/Time   CHOL 152 11/03/2018 1545   TRIG 109 11/03/2018 1545   HDL 61 11/03/2018 1545   CHOLHDL 2.7 04/09/2016 0155   VLDL 27 04/09/2016 0155   LDLCALC 69 11/03/2018 1545   Hepatic Function Panel     Component  Value Date/Time   PROT 6.5 05/04/2019 1025   PROT 6.8 04/29/2017 0944   ALBUMIN 4.4 05/04/2019 1025   ALBUMIN 3.4 (L) 04/29/2017 0944   AST 23 05/04/2019 1025   AST 17 04/29/2017 0944   ALT 16 05/04/2019 1025   ALT 14 04/29/2017 0944   ALKPHOS 65 05/04/2019 1025   ALKPHOS 116 04/29/2017 0944   BILITOT 0.3 05/04/2019 1025   BILITOT 0.41 04/29/2017 0944   Lab Results  Component Value Date   TSH 2.100 05/04/2019    Results for DAWNETTA, COPENHAVER (MRN [id number]) as of 05/08/2019 09:20  Ref. Range 11/03/2018 15:45  Vitamin D, 25-Hydroxy Latest Ref Range: 30.0 - 100.0 ng/mL 57.8   OBESITY BEHAVIORAL INTERVENTION VISIT  Today's visit was #25   Starting weight: 258 lbs Starting date: 04/27/2018 Today's weight: 179 lbs Today's date: 05/04/2019 Total lbs lost to date: 5 At least 15 minutes were spent on discussing the following behavioral intervention visit.    05/04/2019  Height _0  (1.6 m)  Weight 179 lb (81.2 kg)  BMI (Calculated) 31.72  BLOOD PRESSURE - SYSTOLIC 010  BLOOD PRESSURE - DIASTOLIC 78   Body Fat % 93.2 %   ASK: We discussed the diagnosis of obesity with [name] [name] today and [name] [name] agreed to give [place] permission to discuss obesity behavioral modification therapy today.  ASSESS: [name] [name] has the diagnosis of obesity and her BMI today is 31.8. [name] [name] is in the action stage of change.   ADVISE: [name] [name] was educated on the multiple health risks of obesity as well as the benefit of weight loss to improve her health. She was advised of the need for long term treatment and the importance of lifestyle modifications to improve her current health and to decrease her risk of future health problems.  AGREE: Multiple dietary modification options and treatment options were discussed and  Manuelita agreed to follow the recommendations documented in the above note.  ARRANGE: Fawn was educated on the importance of frequent visits to treat obesity as outlined per CMS and  USPSTF guidelines and agreed to schedule her next follow up appointment today.  IMichaelene Song, am acting as Location manager for [name], FNP  I have reviewed the above documentation for accuracy and completeness, and I agree with the above.  - Gara Kincade, FNP-C.

## 2019-05-08 NOTE — Telephone Encounter (Signed)
Please review

## 2019-05-09 ENCOUNTER — Ambulatory Visit (INDEPENDENT_AMBULATORY_CARE_PROVIDER_SITE_OTHER): Payer: Medicare Other | Admitting: Physician Assistant

## 2019-05-10 DIAGNOSIS — E538 Deficiency of other specified B group vitamins: Secondary | ICD-10-CM | POA: Diagnosis not present

## 2019-05-16 DIAGNOSIS — Z23 Encounter for immunization: Secondary | ICD-10-CM | POA: Diagnosis not present

## 2019-05-17 ENCOUNTER — Ambulatory Visit (INDEPENDENT_AMBULATORY_CARE_PROVIDER_SITE_OTHER): Payer: Medicare Other | Admitting: *Deleted

## 2019-05-17 DIAGNOSIS — I6340 Cerebral infarction due to embolism of unspecified cerebral artery: Secondary | ICD-10-CM | POA: Diagnosis not present

## 2019-05-18 ENCOUNTER — Other Ambulatory Visit: Payer: Self-pay

## 2019-05-18 ENCOUNTER — Ambulatory Visit (INDEPENDENT_AMBULATORY_CARE_PROVIDER_SITE_OTHER): Payer: Medicare Other | Admitting: Family Medicine

## 2019-05-18 VITALS — BP 127/76 | HR 72 | Temp 97.8°F | Ht 63.0 in | Wt 176.8 lb

## 2019-05-18 DIAGNOSIS — Z6831 Body mass index (BMI) 31.0-31.9, adult: Secondary | ICD-10-CM

## 2019-05-18 DIAGNOSIS — G4709 Other insomnia: Secondary | ICD-10-CM | POA: Diagnosis not present

## 2019-05-18 DIAGNOSIS — E669 Obesity, unspecified: Secondary | ICD-10-CM | POA: Diagnosis not present

## 2019-05-18 LAB — CUP PACEART REMOTE DEVICE CHECK
Date Time Interrogation Session: 20200827023954
Implantable Pulse Generator Implant Date: 20170721

## 2019-05-18 NOTE — Progress Notes (Signed)
Office: [phone]  /  Fax: [phone]   HPI:   Chief Complaint: OBESITY [name] [name] is here to discuss her progress with her obesity treatment plan. She is on the Category 2 plan and is following her eating plan approximately 80% of the time. She states she is doing cardio and weights 60 minutes 6 times per week. [name] [name] states she is feeling better overall. She had reported not feeling well at her last visit. She does have cheat days but gets back on track. Her weight is 176 lb 12.8 oz (80.2 kg) today and has had a weight loss of 3 pounds over a period of 2 weeks since her last visit. She has lost 82 lbs since starting treatment with [place].  Insomnia [name] [name] states she is not sleeping well and reports waking up 5 times a night. She has tried over-the-counter melatonin. She does fall asleep with the TV on. She has had a sleep study and was diagnosed with mild apnea when she was much heavier. She does not use a CPAP. She does not feel that she currently has apnea since she has lost weight.  She is interested in medication to help her sleep.  ASSESSMENT AND PLAN:  Other insomnia  Class 1 obesity with serious comorbidity and body mass index (BMI) of 31.0 to 31.9 in adult, unspecified obesity type  PLAN:  Insomnia We discussed sleep hygiene and advised her to speak with her PCP about weight neutral medications for insomnia.  Obesity [name] [name] is currently in the action stage of change. As such, her goal is to continue with weight loss efforts. She has agreed to follow the Category 2 plan. [name] [name] has been instructed to continue her current exercise regimern for weight loss and overall health benefits. We discussed the following Behavioral Modification Strategies today: planning for success.  [name] [name] has agreed to follow-up with our clinic in 2 weeks. She was informed of the importance of frequent follow-up visits to maximize her success with intensive lifestyle modifications for her multiple  health conditions.  ALLERGIES: Allergies  Allergen Reactions  . Ambien [Zolpidem] Other (See Comments)    NIGHT TERRORS  . Atorvastatin Other (See Comments)    Muscle weakness Muscle Aches  . Codeine Nausea And Vomiting  . Penicillins Itching and Other (See Comments)    Made nose run uncontrollably Has patient had a PCN reaction causing immediate rash, facial/tongue/throat swelling, SOB or lightheadedness with hypotension: No Has patient had a PCN reaction causing severe rash involving mucus membranes or skin necrosis: No Has patient had a PCN reaction that required hospitalization No Has patient had a PCN reaction occurring within the last 10 years: No If all of the above answers are "NO", then may proceed with Cephalosporin use.  . Simvastatin Other (See Comments)    Muscle weakness  . Byetta 10 Mcg Pen [Exenatide] Nausea Only    MEDICATIONS: Current Outpatient Medications on File Prior to Visit  Medication Sig Dispense Refill  . cholecalciferol (VITAMIN D3) 25 MCG (1000 UT) tablet Take 5,000 Units by mouth daily.    [name] gabapentin (NEURONTIN) 300 MG capsule Take 2 capsules (600 mg total) by mouth at bedtime. 180 capsule 4  . hydrochlorothiazide (HYDRODIURIL) 12.5 MG tablet Take 1 tablet (12.5 mg total) by mouth daily. 90 tablet 0  . iron polysaccharides (NIFEREX) 150 MG capsule Take 150 mg by mouth daily.    . magnesium oxide (MAG-OX) 400 MG tablet Take 400 mg by mouth at bedtime.     [name]  metFORMIN (GLUCOPHAGE) 500 MG tablet Take 1 tablet (500 mg total) by mouth daily with breakfast. 90 tablet 0  . OVER THE COUNTER MEDICATION P5P vitamin    . rosuvastatin (CRESTOR) 20 MG tablet Take 1 tablet (20 mg total) by mouth daily. (Patient taking differently: Take 20 mg by mouth at bedtime. ) 30 tablet 0  . XARELTO 20 MG TABS tablet Take 20 mg by mouth daily.     No current facility-administered medications on file prior to visit.     PAST MEDICAL HISTORY: Past Medical History:   Diagnosis Date  . Anemia   . Arthritis    "knees, hips, possibly back" (04/08/2016)  . Atrial septal aneurysm   . B12 deficiency    a. following gastric bypass.  . Back pain   . Chronic lower back pain    spondylosis  . Cold feet   . Daily headache    "short ones for the last month or 2" (04/08/2016)  . Depression   . Diabetes ([place])   . Dry mouth   . DVT (deep venous thrombosis) ([place])   . Easy bruising   . Floaters in visual field   . GERD (gastroesophageal reflux disease)    takes Protonix daily "just to protect my stomach; not for reflux" (04/08/2016)  . Hay fever   . Heart murmur    "dx'd by 1 anesthesiologist years and years ago"  . History of loop recorder 03/2016  . Hyperlipidemia   . Hypertension   . Joint pain   . Leg cramp   . Macular degeneration    "just watching"  . Neuropathy   . Nosebleed   . Palpitations   . Peripheral edema   . Peripheral neuropathy    not on any meds  . 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  [name] Pneumonia 12/2017  . Sleep apnea    no CPAP since weight loss after bariatric surgery '12 (04/08/2016)  . Spondylosis   . Stroke ([place]) 04/07/2016   a. 01/2016 and 03/2016. during admission had + LE DVT.  [name] Swelling of extremity   . TIA (transient ischemic attack)   . Trouble in sleeping   . Urinary incontinence   . Weakness     PAST SURGICAL HISTORY: Past Surgical History:  Procedure Laterality Date  . CATARACT EXTRACTION W/ INTRAOCULAR LENS  IMPLANT, BILATERAL Bilateral ~ 2012  . COLONOSCOPY    . EP IMPLANTABLE DEVICE N/A 04/10/2016   Procedure: Loop Recorder Insertion;  Surgeon: Will Meredith Leeds, MD;  Location: [facility];  Service: Cardiovascular;  Laterality: N/A;  . ESOPHAGOGASTRODUODENOSCOPY     in the 70's  . FACIAL COSMETIC SURGERY    . JOINT REPLACEMENT    . 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 SINUS ENDO W/FUSION  02/28/2016   Procedure: ENDOSCOPIC SINUS SURGERY WITH NAVIGATION;  Surgeon: Ruby Cola, MD;  Location: [facility];  Service: ENT;;  . TEE WITHOUT CARDIOVERSION N/A 04/10/2016   Procedure: TRANSESOPHAGEAL ECHOCARDIOGRAM (TEE) POSSIBLE LOOP;  Surgeon: Sanda Klein, MD;  Location: [facility];  Service: Cardiovascular;  Laterality: N/A;  . TONSILLECTOMY  1957  . TOTAL HIP ARTHROPLASTY Right 2008  . TOTAL HIP REVISION Right 03/30/2016   Procedure: RIGHT TOTAL HIP REVISION;  Surgeon: Frederik Pear, MD;  Location: [facility];  Service: Orthopedics;  Laterality: Right;  . TOTAL KNEE ARTHROPLASTY Bilateral 2001  . VAGINAL HYSTERECTOMY     "left my ovaries"    SOCIAL  HISTORY: Social History   Tobacco Use  . Smoking status: Former Smoker    Packs/day: 1.00    Years: 40.00    Pack years: 40.00    Types: Cigarettes    Quit date: 08/2006    Years since quitting: 12.7  . Smokeless tobacco: Never Used  Substance Use Topics  . Alcohol use: Yes    Alcohol/week: 0.0 standard drinks    Comment: 04/08/2016 "5-6 drinks/year'  . Drug use: No    Types: Marijuana    Comment: 04/08/2016 "did some marijuana in college"    FAMILY HISTORY: Family History  Problem Relation Age of Onset  . Neuropathy Mother   . Hypertension Mother   . Hyperlipidemia Mother   . Thyroid disease Mother   . Prostate cancer Father   . Lymphoma Father   . Hypertension Father   . Hyperlipidemia Father   . Cancer Father   . Obesity Father   . Congestive Heart Failure Maternal Grandmother   . Lung cancer Maternal Grandfather    ROS: Review of Systems  Psychiatric/Behavioral: The patient has insomnia.    PHYSICAL EXAM: Blood pressure 127/76, pulse 72, temperature 97.8 F (36.6 C), temperature source Oral, height _0  (1.6 m), weight 176 lb 12.8 oz (80.2 kg), SpO2 97 %. Body mass index is 31.32 kg/m. Physical Exam Vitals signs reviewed.  Constitutional:      Appearance: Normal appearance. She is obese.  Cardiovascular:     Rate and Rhythm: Normal rate.     Pulses: Normal pulses.  Pulmonary:      Effort: Pulmonary effort is normal.     Breath sounds: Normal breath sounds.  Musculoskeletal: Normal range of motion.  Skin:    General: Skin is warm and dry.  Neurological:     Mental Status: She is alert and oriented to person, place, and time.  Psychiatric:        Behavior: Behavior normal.   RECENT LABS AND TESTS: BMET    Component Value Date/Time   NA 142 05/04/2019 1025   NA 139 04/29/2017 0944   K 4.7 05/04/2019 1025   K 3.9 04/29/2017 0944   CL 100 05/04/2019 1025   CO2 27 05/04/2019 1025   CO2 29 04/29/2017 0944   GLUCOSE 89 05/04/2019 1025   GLUCOSE 120 04/29/2017 0944   BUN 17 05/04/2019 1025   BUN 15.8 04/29/2017 0944   CREATININE 0.84 05/04/2019 1025   CREATININE 0.8 04/29/2017 0944   CALCIUM 9.6 05/04/2019 1025   CALCIUM 9.2 04/29/2017 0944   GFRNONAA 72 05/04/2019 1025   GFRAA 83 05/04/2019 1025   Lab Results  Component Value Date   HGBA1C 4.8 05/04/2019   HGBA1C 5.1 11/03/2018   HGBA1C 5.4 08/02/2018   HGBA1C 6.9 (H) 04/27/2018   HGBA1C 6.0 (H) 04/09/2016   Lab Results  Component Value Date   INSULIN 6.9 08/02/2018   INSULIN 7.0 04/27/2018   CBC    Component Value Date/Time   WBC 6.6 05/04/2019 1025   WBC 7.9 04/29/2017 0944   WBC 16.0 (H) 04/13/2016 1105   RBC 4.97 05/04/2019 1025   RBC 4.53 04/29/2017 0944   RBC 3.65 (L) 04/13/2016 1105   HGB 13.5 05/04/2019 1025   HGB 11.9 04/29/2017 0944   HCT 43.1 05/04/2019 1025   HCT 36.9 04/29/2017 0944   PLT 275 04/27/2018 0000   MCV 87 05/04/2019 1025   MCV 81.5 04/29/2017 0944   MCH 27.2 05/04/2019 1025   MCH 26.3 04/29/2017 0944  MCH 26.3 04/13/2016 1105   MCHC 31.3 (L) 05/04/2019 1025   MCHC 32.2 04/29/2017 0944   MCHC 31.5 04/13/2016 1105   RDW 18.0 (H) 05/04/2019 1025   RDW 14.2 04/29/2017 0944   LYMPHSABS 1.1 05/04/2019 1025   LYMPHSABS 1.7 04/29/2017 0944   MONOABS 0.6 04/29/2017 0944   EOSABS 0.1 05/04/2019 1025   BASOSABS 0.1 05/04/2019 1025   BASOSABS 0.0 04/29/2017 0944    Iron/TIBC/Ferritin/ %Sat    Component Value Date/Time   IRON 28 11/03/2018 1545   IRON 51 04/29/2017 0944   TIBC 335 11/03/2018 1545   TIBC 343 04/29/2017 0944   FERRITIN 34 11/03/2018 1545   FERRITIN 23 04/29/2017 0944   IRONPCTSAT 8 (LL) 11/03/2018 1545   IRONPCTSAT 15 (L) 04/29/2017 0944   IRONPCTSAT 12 (L) 05/29/2011 1436   Lipid Panel     Component Value Date/Time   CHOL 152 11/03/2018 1545   TRIG 109 11/03/2018 1545   HDL 61 11/03/2018 1545   CHOLHDL 2.7 04/09/2016 0155   VLDL 27 04/09/2016 0155   LDLCALC 69 11/03/2018 1545   Hepatic Function Panel     Component Value Date/Time   PROT 6.5 05/04/2019 1025   PROT 6.8 04/29/2017 0944   ALBUMIN 4.4 05/04/2019 1025   ALBUMIN 3.4 (L) 04/29/2017 0944   AST 23 05/04/2019 1025   AST 17 04/29/2017 0944   ALT 16 05/04/2019 1025   ALT 14 04/29/2017 0944   ALKPHOS 65 05/04/2019 1025   ALKPHOS 116 04/29/2017 0944   BILITOT 0.3 05/04/2019 1025   BILITOT 0.41 04/29/2017 0944   Lab Results  Component Value Date   TSH 2.100 05/04/2019    Results for SARAI, JANUARY (MRN [id number]) as of 05/18/2019 11:01  Ref. Range 05/04/2019 10:25  Vitamin D, 25-Hydroxy Latest Ref Range: 30.0 - 100.0 ng/mL 50.7   OBESITY BEHAVIORAL INTERVENTION VISIT  Today's visit was #26   Starting weight: 258 lbs Starting date: 04/27/2018 Today's weight: 176 lbs  Today's date: 05/18/2019 Total lbs lost to date: 52 At least 15 minutes were spent on discussing the following behavioral intervention visit.    05/18/2019  Height _0  (1.6 m)  Weight 176 lb 12.8 oz (80.2 kg)  BMI (Calculated) 31.33  BLOOD PRESSURE - SYSTOLIC 854  BLOOD PRESSURE - DIASTOLIC 76   Body Fat % 62.7 %   ASK: We discussed the diagnosis of obesity with [name] [name] today and [place] agreed to give [place] permission to discuss obesity behavioral modification therapy today.  ASSESS: Aine has the diagnosis of obesity and her BMI today is 31.3. Aundrea is in the action  stage of change.   ADVISE: Shannah was educated on the multiple health risks of obesity as well as the benefit of weight loss to improve her health. She was advised of the need for long term treatment and the importance of lifestyle modifications to improve her current health and to decrease her risk of future health problems.  AGREE: Multiple dietary modification options and treatment options were discussed and  [name] [name] agreed to follow the recommendations documented in the above note.  ARRANGE: [name] [name] was educated on the importance of frequent visits to treat obesity as outlined per CMS and USPSTF guidelines and agreed to schedule her next follow up appointment today.  IMichaelene [name], am acting as Location manager for [name], FNP  I have reviewed the above documentation for accuracy and completeness, and I agree with the above.  -  , FNP-C.

## 2019-05-22 ENCOUNTER — Encounter (INDEPENDENT_AMBULATORY_CARE_PROVIDER_SITE_OTHER): Payer: Self-pay | Admitting: Family Medicine

## 2019-05-22 DIAGNOSIS — G4709 Other insomnia: Secondary | ICD-10-CM | POA: Insufficient documentation

## 2019-05-25 ENCOUNTER — Other Ambulatory Visit: Payer: Self-pay | Admitting: Family Medicine

## 2019-05-25 DIAGNOSIS — Z1231 Encounter for screening mammogram for malignant neoplasm of breast: Secondary | ICD-10-CM

## 2019-05-25 NOTE — Progress Notes (Signed)
[facility] Summary Report / Loop Recorder

## 2019-06-01 ENCOUNTER — Encounter (INDEPENDENT_AMBULATORY_CARE_PROVIDER_SITE_OTHER): Payer: Self-pay | Admitting: Family Medicine

## 2019-06-01 ENCOUNTER — Other Ambulatory Visit: Payer: Self-pay

## 2019-06-01 ENCOUNTER — Ambulatory Visit (INDEPENDENT_AMBULATORY_CARE_PROVIDER_SITE_OTHER): Payer: Medicare Other | Admitting: Family Medicine

## 2019-06-01 VITALS — BP 103/66 | HR 80 | Temp 98.5°F | Ht 63.0 in | Wt 171.4 lb

## 2019-06-01 DIAGNOSIS — E669 Obesity, unspecified: Secondary | ICD-10-CM

## 2019-06-01 DIAGNOSIS — E119 Type 2 diabetes mellitus without complications: Secondary | ICD-10-CM

## 2019-06-01 DIAGNOSIS — R600 Localized edema: Secondary | ICD-10-CM | POA: Diagnosis not present

## 2019-06-01 DIAGNOSIS — R609 Edema, unspecified: Secondary | ICD-10-CM

## 2019-06-01 DIAGNOSIS — Z6830 Body mass index (BMI) 30.0-30.9, adult: Secondary | ICD-10-CM

## 2019-06-02 DIAGNOSIS — R42 Dizziness and giddiness: Secondary | ICD-10-CM | POA: Diagnosis not present

## 2019-06-02 LAB — COMPREHENSIVE METABOLIC PANEL
ALT: 19 IU/L (ref 0–32)
AST: 30 IU/L (ref 0–40)
Albumin/Globulin Ratio: 2.1 (ref 1.2–2.2)
Albumin: 4.4 g/dL (ref 3.8–4.8)
Alkaline Phosphatase: 66 IU/L (ref 39–117)
BUN/Creatinine Ratio: 28 (ref 12–28)
BUN: 23 mg/dL (ref 8–27)
Bilirubin Total: 0.4 mg/dL (ref 0.0–1.2)
CO2: 25 mmol/L (ref 20–29)
Calcium: 9.1 mg/dL (ref 8.7–10.3)
Chloride: 98 mmol/L (ref 96–106)
Creatinine, Ser: 0.82 mg/dL (ref 0.57–1.00)
GFR calc Af Amer: 85 mL/min/{1.73_m2} (ref >59)
GFR calc non Af Amer: 74 mL/min/{1.73_m2} (ref >59)
Globulin, Total: 2.1 g/dL (ref 1.5–4.5)
Glucose: 88 mg/dL (ref 65–99)
Potassium: 3.9 mmol/L (ref 3.5–5.2)
Sodium: 139 mmol/L (ref 134–144)
Total Protein: 6.5 g/dL (ref 6.0–8.5)

## 2019-06-04 ENCOUNTER — Emergency Department (HOSPITAL_COMMUNITY): Payer: Medicare Other

## 2019-06-04 ENCOUNTER — Other Ambulatory Visit: Payer: Self-pay

## 2019-06-04 ENCOUNTER — Encounter (HOSPITAL_COMMUNITY): Payer: Self-pay | Admitting: Emergency Medicine

## 2019-06-04 ENCOUNTER — Emergency Department (HOSPITAL_COMMUNITY)
Admission: EM | Admit: 2019-06-04 | Discharge: 2019-06-04 | Disposition: A | Discharge status: Home / Self Care | Payer: Medicare Other | Attending: Emergency Medicine | Admitting: Emergency Medicine

## 2019-06-04 DIAGNOSIS — E119 Type 2 diabetes mellitus without complications: Secondary | ICD-10-CM | POA: Diagnosis not present

## 2019-06-04 DIAGNOSIS — Z7984 Long term (current) use of oral hypoglycemic drugs: Secondary | ICD-10-CM | POA: Insufficient documentation

## 2019-06-04 DIAGNOSIS — I10 Essential (primary) hypertension: Secondary | ICD-10-CM | POA: Diagnosis not present

## 2019-06-04 DIAGNOSIS — R4020 Unspecified coma: Secondary | ICD-10-CM | POA: Diagnosis not present

## 2019-06-04 DIAGNOSIS — R42 Dizziness and giddiness: Secondary | ICD-10-CM | POA: Diagnosis not present

## 2019-06-04 DIAGNOSIS — Z8673 Personal history of transient ischemic attack (TIA), and cerebral infarction without residual deficits: Secondary | ICD-10-CM | POA: Diagnosis not present

## 2019-06-04 DIAGNOSIS — Z87891 Personal history of nicotine dependence: Secondary | ICD-10-CM | POA: Insufficient documentation

## 2019-06-04 DIAGNOSIS — Z7901 Long term (current) use of anticoagulants: Secondary | ICD-10-CM | POA: Diagnosis not present

## 2019-06-04 DIAGNOSIS — R531 Weakness: Secondary | ICD-10-CM | POA: Diagnosis not present

## 2019-06-04 DIAGNOSIS — Z79899 Other long term (current) drug therapy: Secondary | ICD-10-CM | POA: Diagnosis not present

## 2019-06-04 DIAGNOSIS — R270 Ataxia, unspecified: Secondary | ICD-10-CM | POA: Diagnosis not present

## 2019-06-04 LAB — URINALYSIS, ROUTINE W REFLEX MICROSCOPIC
Bilirubin Urine: NEGATIVE
Glucose, UA: NEGATIVE mg/dL
Hgb urine dipstick: NEGATIVE
Ketones, ur: NEGATIVE mg/dL
Leukocytes,Ua: NEGATIVE
Nitrite: NEGATIVE
Protein, ur: NEGATIVE mg/dL
Specific Gravity, Urine: 1.004 — ABNORMAL LOW (ref 1.005–1.030)
pH: 7 (ref 5.0–8.0)

## 2019-06-04 LAB — CBC
HCT: 42.2 % (ref 36.0–46.0)
Hemoglobin: 13.6 g/dL (ref 12.0–15.0)
MCH: 28.5 pg (ref 26.0–34.0)
MCHC: 32.2 g/dL (ref 30.0–36.0)
MCV: 88.5 fL (ref 80.0–100.0)
Platelets: 244 10*3/uL (ref 150–400)
RBC: 4.77 MIL/uL (ref 3.87–5.11)
RDW: 15.4 % (ref 11.5–15.5)
WBC: 8 10*3/uL (ref 4.0–10.5)
nRBC: 0 % (ref 0.0–0.2)

## 2019-06-04 LAB — BASIC METABOLIC PANEL
Anion gap: 8 (ref 5–15)
BUN: 13 mg/dL (ref 8–23)
CO2: 26 mmol/L (ref 22–32)
Calcium: 9 mg/dL (ref 8.9–10.3)
Chloride: 105 mmol/L (ref 98–111)
Creatinine, Ser: 0.75 mg/dL (ref 0.44–1.00)
GFR calc Af Amer: >60 mL/min (ref >60)
GFR calc non Af Amer: >60 mL/min (ref >60)
Glucose, Bld: 132 mg/dL — ABNORMAL HIGH (ref 70–99)
Potassium: 3.9 mmol/L (ref 3.5–5.1)
Sodium: 139 mmol/L (ref 135–145)

## 2019-06-04 LAB — CBG MONITORING, ED: Glucose-Capillary: 126 mg/dL — ABNORMAL HIGH (ref 70–99)

## 2019-06-04 MED ORDER — MECLIZINE HCL 25 MG PO TABS
25.0000 mg | ORAL_TABLET | Freq: Once | ORAL | Status: AC
  Administered 2019-06-04: 15:00:00 25 mg via ORAL
  Filled 2019-06-04: qty 1

## 2019-06-04 MED ORDER — SODIUM CHLORIDE 0.9% FLUSH: 3.0000 mL | Freq: Once | INTRAVENOUS | Status: DC

## 2019-06-04 MED ORDER — MECLIZINE HCL 25 MG PO TABS: 25.0000 mg | ORAL_TABLET | Freq: Three times a day (TID) | ORAL | 0 refills | Status: DC | PRN

## 2019-06-04 NOTE — ED Provider Notes (Signed)
[facility] EMERGENCY DEPARTMENT Provider Note   CSN: [id number] Arrival date & time: 06/04/19  1026     History   Chief Complaint Chief Complaint  Patient presents with   Dizziness    HPI [name] [name] is a 68 y.o. female.     Patient is a 68 year old female with a history of hypertension, hyperlipidemia, diabetes and she had 2 strokes in 2017.  She presents with dizziness.  She states that started 4 days ago.  It started on Wednesday night but when she woke up on Thursday she was feeling better and did not really have any trouble with the dizziness.  Friday it got a little worse and Saturday she thought she was feeling better but when she was starting to go the gym, she lost her balance and fell forward.  She laid in bed most of the rest of the day.  She says it was worse today.  She feels like she is very off balance.  She describes a spinning sensation which is worse when she turns her head from side to side.  It is better if she still.  She feels kind of weak all over and feels a little tremulous when she is walking.  She says she is very off balance when she is walking.  She denies any numbness or weakness to her extremities other than feeling generally weak and she has some neuropathy in her lower legs.  She is currently on Xarelto for prior DVT.  She denies any recent head trauma other than on Thursday she fell into the stove when she lost her balance.  She does not report any significant head trauma at that time.  She denies any injuries.  No chest pain or shortness of breath.  No fevers, cough, vomiting, diarrhea or other recent illnesses.  No urinary symptoms.  She has had some associated nausea but no vomiting.  No history of vertigo in the past.  No sinus congestion or ear pain.  She has been using some Mucinex with no improvement in symptoms.  She also tried Dramamine which also did not improve her symptoms.  She has been having some intermittent headaches but  denies any current headache.  No speech deficits or vision changes.     Past Medical History:  Diagnosis Date   Anemia    Arthritis    "knees, hips, possibly back" (04/08/2016)   Atrial septal aneurysm    B12 deficiency    a. following gastric bypass.   Back pain    Chronic lower back pain    spondylosis   Cold feet    Daily headache    "short ones for the last month or 2" (04/08/2016)   Depression    Diabetes (HCC)    Dry mouth    DVT (deep venous thrombosis) (HCC)    Easy bruising    Floaters in visual field    GERD (gastroesophageal reflux disease)    takes Protonix daily "just to protect my stomach; not for reflux" (04/08/2016)   Hay fever    Heart murmur    "dx'd by 1 anesthesiologist years and years ago"   History of loop recorder 03/2016   Hyperlipidemia    Hypertension    Joint pain    Leg cramp    Macular degeneration    "just watching"   Neuropathy    Nosebleed    Palpitations    Peripheral edema    Peripheral neuropathy    not  on any meds   Pituitary mass [facility])    a. s/p endoscopic surgery 02/2016.   Pneumonia 12/2017   Sleep apnea    no CPAP since weight loss after bariatric surgery '12 (04/08/2016)   Spondylosis    Stroke ([place]) 04/07/2016   a. 01/2016 and 03/2016. during admission had + LE DVT.   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/2016   Cerebrovascular  accident (CVA) due to embolism of cerebral artery ([place])    Expressive aphasia 04/09/2016   Anemia 04/09/2016   Hypocalcemia 04/09/2016   CVA (cerebral infarction) 04/08/2016   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 ([place]) 03/30/2016   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[place]), 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   Thalamic infarct, 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   Stroke ([place]) 01/31/2016   Stroke-like symptoms 01/31/2016   Hyperglycemia 01/31/2016   Type 2 diabetes mellitus without complication, without long-term current use of insulin (HCC)    Class 1 obesity with serious comorbidity and body mass index (BMI) of 30.0 to 30.9 in adult     Past Surgical History:  Procedure Laterality Date   CATARACT EXTRACTION W/ INTRAOCULAR LENS  IMPLANT, BILATERAL Bilateral ~ 2012   COLONOSCOPY     EP IMPLANTABLE DEVICE N/A 04/10/2016   Procedure: Loop Recorder Insertion;  Surgeon: Will Meredith Leeds, MD;  Location: [facility] CV LAB;  Service: Cardiovascular;  Laterality: N/A;   ESOPHAGOGASTRODUODENOSCOPY     in the 70's   FACIAL COSMETIC SURGERY     JOINT REPLACEMENT     ROUX-EN-Y GASTRIC BYPASS  11/24/10   SINUS ENDO W/FUSION  02/28/2016   Procedure: ENDOSCOPIC SINUS SURGERY WITH NAVIGATION;  Surgeon: Ruby Cola, MD;  Location: [facility];  Service: ENT;;   TEE WITHOUT CARDIOVERSION N/A 04/10/2016   Procedure: TRANSESOPHAGEAL ECHOCARDIOGRAM (TEE) POSSIBLE LOOP;  Surgeon: Sanda Klein, MD;  Location: [facility];  Service: Cardiovascular;  Laterality: N/A;   [place] Right 2008   TOTAL HIP REVISION Right 03/30/2016   Procedure: RIGHT TOTAL HIP REVISION;  Surgeon: Frederik Pear, MD;  Location: [facility];  Service: Orthopedics;  Laterality: Right;   TOTAL KNEE ARTHROPLASTY Bilateral 2001   VAGINAL HYSTERECTOMY     "left my ovaries"     OB History    Gravida  0   Para  0   Term  0   Preterm  0   AB  0   Living    0     SAB  0   TAB  0   Ectopic  0   Multiple  0   Live Births  0            Home Medications    Prior to Admission medications   Medication Sig Start Date End Date Taking? Authorizing Provider  cholecalciferol (VITAMIN D3) 25 MCG (1000 UT) tablet Take 5,000 Units  by mouth daily.   Yes [provider]  ferrous sulfate 325 (65 FE) MG tablet Take 325 mg by mouth 2 (two) times daily with a meal.   Yes [provider]  gabapentin (NEURONTIN) 300 MG capsule Take 2 capsules (600 mg total) by mouth at bedtime. 02/07/18  Yes Melvenia Beam, MD  magnesium oxide (MAG-OX) 400 MG tablet Take 400 mg by mouth at bedtime.    Yes [provider]  metFORMIN (GLUCOPHAGE) 500 MG tablet Take 1 tablet (500 mg total) by mouth daily with breakfast. 05/04/19  Yes Whitmire, Dawn W, FNP  Multiple Vitamins-Minerals (OCUVITE-LUTEIN PO) Take 1 tablet by mouth daily.   Yes [provider]  OVER THE COUNTER MEDICATION P5P vitamin   Yes [provider]  rosuvastatin (CRESTOR) 20 MG tablet Take 1 tablet (20 mg total) by mouth daily. Patient taking differently: Take 20 mg by mouth at bedtime.  02/01/16  Yes Lavina Hamman, MD  XARELTO 20 MG TABS tablet Take 20 mg by mouth daily. 06/04/16  Yes [provider]  meclizine (ANTIVERT) 25 MG tablet Take 1 tablet (25 mg total) by mouth 3 (three) times daily as needed for dizziness. 06/04/19   Malvin Johns, MD    Family History Family History  Problem Relation Age of Onset   Neuropathy Mother    Hypertension Mother    Hyperlipidemia Mother    Thyroid disease Mother    Prostate cancer Father    Lymphoma Father    Hypertension Father    Hyperlipidemia Father    Cancer Father    Obesity Father    Congestive Heart Failure Maternal Grandmother    Lung cancer Maternal Grandfather     Social History Social History   Tobacco Use   Smoking status: Former Smoker    Packs/day: 1.00    Years:  40.00    Pack years: 40.00    Types: Cigarettes    Quit date: 08/2006    Years since quitting: 12.7   Smokeless tobacco: Never Used  Substance Use Topics   Alcohol use: Yes    Alcohol/week: 0.0 standard drinks    Comment: 04/08/2016 "5-6 drinks/year'   Drug use: No    Types: Marijuana    Comment: 04/08/2016 "did some marijuana in college"     Allergies   Ambien [zolpidem], Atorvastatin, Codeine, Penicillins, Simvastatin, and Byetta 10 mcg pen [exenatide]   Review of Systems Review of Systems  Constitutional: Negative for chills, diaphoresis, fatigue and fever.  HENT: Negative for congestion, rhinorrhea and sneezing.   Eyes: Negative.   Respiratory: Negative for cough, chest tightness and shortness of breath.   Cardiovascular: Negative for chest pain and leg swelling.  Gastrointestinal: Positive for nausea. Negative for abdominal pain, blood in stool, diarrhea and vomiting.  Genitourinary: Negative for difficulty urinating, flank pain, frequency and hematuria.  Musculoskeletal: Negative for arthralgias and back pain.  Skin: Negative for rash.  Neurological: Positive for dizziness, weakness (Generalized) and headaches. Negative for speech difficulty and numbness.     Physical Exam Updated Vital Signs BP (!) 142/78    Pulse 62    Temp 98.6 F (37 C)    Resp 18    SpO2 98%   Physical Exam Constitutional:      Appearance: She is well-developed.  HENT:     Head: Normocephalic and atraumatic.  Eyes:     Pupils: Pupils are equal, round, and reactive to light.     Comments: Very mild horizontal nystagmus  but I cannot really get a unidirectional fast component.  No vertical or rotational nystagmus  Neck:     Musculoskeletal: Normal range of motion and neck supple.  Cardiovascular:     Rate and Rhythm: Normal rate and regular rhythm.     Heart sounds: Normal heart sounds.  Pulmonary:     Effort: Pulmonary effort is normal. No respiratory distress.     Breath sounds:  Normal breath sounds. No wheezing or rales.  Chest:     Chest wall: No tenderness.  Abdominal:     General: Bowel sounds are normal.     Palpations: Abdomen is soft.     Tenderness: There is no abdominal tenderness. There is no guarding or rebound.  Musculoskeletal: Normal range of motion.  Lymphadenopathy:     Cervical: No cervical adenopathy.  Skin:    General: Skin is warm and dry.     Findings: No rash.  Neurological:     Mental Status: She is alert and oriented to person, place, and time.     Comments: Motor 5/5 all extremities Sensation grossly intact to LT all extremities.  She states her left leg is slightly diminished to light touch than the right although she feels like it may be from her neuropathy. Finger to Nose intact, no pronator drift CN II-XII grossly intact Visual fields full to confrontation. Gait normal       ED Treatments / Results  Labs (all labs ordered are listed, but only abnormal results are displayed) Labs Reviewed  BASIC METABOLIC PANEL - Abnormal; Notable for the following components:      Result Value   Glucose, Bld 132 (*)    All other components within normal limits  URINALYSIS, ROUTINE W REFLEX MICROSCOPIC - Abnormal; Notable for the following components:   Color, Urine STRAW (*)    Specific Gravity, Urine 1.004 (*)    All other components within normal limits  CBG MONITORING, ED - Abnormal; Notable for the following components:   Glucose-Capillary 126 (*)    All other components within normal limits  CBC    EKG EKG Interpretation  Date/Time:  Sunday June 04 2019 10:42:36 EDT Ventricular Rate:  68 PR Interval:  162 QRS Duration: 78 QT Interval:  392 QTC Calculation: 416 R Axis:   -4 Text Interpretation:  Normal sinus rhythm Septal infarct , age undetermined Abnormal ECG since last tracing no significant change Confirmed by Malvin Johns [phone]) on 06/04/2019 12:04:51 PM   Radiology Mr Brain Wo Contrast  Result Date:  06/04/2019 CLINICAL DATA:  Ataxia, stroke suspected. Progressive dizziness over the last 2 days. Initial encounter. EXAM: MRI HEAD WITHOUT CONTRAST TECHNIQUE: Multiplanar, multiecho pulse sequences of the brain and surrounding structures were obtained without intravenous contrast. COMPARISON:  MR head without and with contrast 09/28/2018 FINDINGS: Brain: The diffusion-weighted images demonstrate no acute or subacute infarction. Sellar and suprasellar tumor extending into the left cavernous sinus is similar the prior exam. Moderate diffuse bilateral periventricular and subcortical white matter disease is present. Subcortical extension is not significantly changed. No acute hemorrhage or mass lesion is present. The ventricles are of normal size. No significant extraaxial fluid collection is present. The internal auditory canals are within normal limits. The brainstem and cerebellum are within normal limits. Vascular: Flow is present in the major intracranial arteries. Skull and upper cervical spine: The craniocervical junction is normal. Upper cervical spine is within normal limits. Marrow signal is unremarkable. Sinuses/Orbits: The paranasal sinuses and mastoid air cells are clear.  Bilateral lens replacements are noted. Globes and orbits are otherwise unremarkable. IMPRESSION: 1. No acute intracranial abnormality.  No acute stroke. 2. Stable atrophy and diffuse white matter disease, moderately advanced for age. This likely reflects the sequela of chronic microvascular ischemia. 3. Residual left pituitary tumor extending into the cavernous sinus. No significant interval growth. Electronically Signed   By: San Morelle M.D.   On: 06/04/2019 18:33    Procedures Procedures (including critical care time)  Medications Ordered in ED Medications  sodium chloride flush (NS) 0.9 % injection 3 mL (3 mLs Intravenous Not Given 06/04/19 1108)  meclizine (ANTIVERT) tablet 25 mg (25 mg Oral Given 06/04/19 1452)      Initial Impression / Assessment and Plan / ED Course  I have reviewed the triage vital signs and the nursing notes.  Pertinent labs & imaging results that were available during my care of the patient were reviewed by me and considered in my medical decision making (see chart for details).        Patient is a 68 year old female who presents with vertigo symptoms.  She is neurologically intact.  However given her prior history of strokes, an MRI was performed which shows no evidence of acute abnormality.  No acute stroke.  She was given meclizine and is feeling better after this.  She is able to ambulate without significant ataxia.  Her labs are non-concerning.  She was discharged home in good condition.  She was given a prescription for meclizine and encouraged to follow-up with her PCP if her symptoms are not improving.  Return precautions were given.  Final Clinical Impressions(s) / ED Diagnoses   Final diagnoses:  Vertigo    ED Discharge Orders         Ordered    meclizine (ANTIVERT) 25 MG tablet  3 times daily PRN     06/04/19 1951           Malvin Johns, MD 06/04/19 1954

## 2019-06-04 NOTE — ED Notes (Signed)
Patient transported to MRI 

## 2019-06-04 NOTE — ED Notes (Signed)
Pt taken to the restroom, reports her dizziness has improved but still present. Awaiting MRI

## 2019-06-04 NOTE — ED Notes (Signed)
Patient Alert and oriented to baseline. Stable and ambulatory to baseline. Patient verbalized understanding of the discharge instructions.  Patient belongings were taken by the patient.   

## 2019-06-04 NOTE — ED Notes (Signed)
Pt remains in MRI

## 2019-06-04 NOTE — ED Triage Notes (Signed)
EMS stated, started getting dizzy . And its getting more and more in the last 2 days of being dizzy.  VAN- Neg.

## 2019-06-04 NOTE — ED Notes (Signed)
Pt has returned from MRI.

## 2019-06-05 ENCOUNTER — Encounter (INDEPENDENT_AMBULATORY_CARE_PROVIDER_SITE_OTHER): Payer: Self-pay | Admitting: Family Medicine

## 2019-06-05 NOTE — Progress Notes (Signed)
Office: [phone]  /  Fax: [phone]   HPI:   Chief Complaint: OBESITY [name] [name] is here to discuss her progress with her obesity treatment plan. She is on the  follow the Category 2 plan and is following her eating plan approximately 80 % of the time. She states she is doing cardio exercise and weights 60 minutes 6 times per week. [name] [name] is following the plan well and she is working out most days of the week. She went back to the gym. Her weight is 171 lb 6.4 oz (77.7 kg) today and has had a weight loss of 5 pounds over a period of 2 weeks since her last visit. She has lost 87 lbs since starting treatment with [place].  Peripheral Edema [name] [name] has a diagnosis of peripheral edema. Her peripheral edema improved with HCTZ however her blood pressure has decreased and she has had some "dizzy episodes". Her blood pressure is low today art 103/66.  Diabetes II [name] [name] has a diagnosis of diabetes type II. [name] [name] is well controlled on Metformin. Last A1c was at 4.8 She has been working on intensive lifestyle modifications including diet, exercise, and weight loss to help control her blood glucose levels.  ASSESSMENT AND PLAN:  Peripheral edema - Plan: Comprehensive metabolic panel  Type 2 diabetes mellitus without complication, without long-term current use of insulin (HCC)  Class 1 obesity with serious comorbidity and body mass index (BMI) of 30.0 to 30.9 in adult, unspecified obesity type  PLAN:  Peripheral Edema [name] [name] will discontinue HCTZ for a few days and if her dizziness occurs again. If this does not help she will make appointment to see neurology. We will check CMP today and [name] [name] will follow up at the agreed upon time.  Diabetes II [name] [name] has been given extensive diabetes education by myself today including ideal fasting and post-prandial blood glucose readings, individual ideal Hgb A1c goals and hypoglycemia prevention. We discussed the importance of good blood sugar control to  decrease the likelihood of diabetic complications such as nephropathy, neuropathy, limb loss, blindness, coronary artery disease, and death. We discussed the importance of intensive lifestyle modification including diet, exercise and weight loss as the first line treatment for diabetes. [name] [name] will continue metformin and she will follow up at the agreed upon time.  Obesity [name] [name] is currently in the action stage of change. As such, her goal is to continue with weight loss efforts She has agreed to follow the Category 2 plan [name] [name] will continue her current exercise regimen for weight loss and overall health benefits. We discussed the following Behavioral Modification Strategies today: planning for success We will recheck indirect calorimetry at the next visit.  [name] [name] has agreed to follow up with our clinic in 4 weeks. She was informed of the importance of frequent follow up visits to maximize her success with intensive lifestyle modifications for her multiple health conditions.  ALLERGIES: Allergies  Allergen Reactions  . Ambien [Zolpidem] Other (See Comments)    NIGHT TERRORS  . Atorvastatin Other (See Comments)    Muscle weakness Muscle Aches  . Codeine Nausea And Vomiting  . Penicillins Itching and Other (See Comments)    Made nose run uncontrollably Has patient had a PCN reaction causing immediate rash, facial/tongue/throat swelling, SOB or lightheadedness with hypotension: No Has patient had a PCN reaction causing severe rash involving mucus membranes or skin necrosis: No Has patient had a PCN reaction that required hospitalization No Has patient had a PCN reaction occurring within the last 10  years: No If all of the above answers are "NO", then may proceed with Cephalosporin use.  . Simvastatin Other (See Comments)    Muscle weakness  . Byetta 10 Mcg Pen [Exenatide] Nausea Only    MEDICATIONS: Current Outpatient Medications on File Prior to Visit  Medication Sig Dispense  Refill  . cholecalciferol (VITAMIN D3) 25 MCG (1000 UT) tablet Take 5,000 Units by mouth daily.    [name] gabapentin (NEURONTIN) 300 MG capsule Take 2 capsules (600 mg total) by mouth at bedtime. 180 capsule 4  . magnesium oxide (MAG-OX) 400 MG tablet Take 400 mg by mouth at bedtime.     . metFORMIN (GLUCOPHAGE) 500 MG tablet Take 1 tablet (500 mg total) by mouth daily with breakfast. 90 tablet 0  . OVER THE COUNTER MEDICATION P5P vitamin    . rosuvastatin (CRESTOR) 20 MG tablet Take 1 tablet (20 mg total) by mouth daily. (Patient taking differently: Take 20 mg by mouth at bedtime. ) 30 tablet 0  . XARELTO 20 MG TABS tablet Take 20 mg by mouth daily.     No current facility-administered medications on file prior to visit.     PAST MEDICAL HISTORY: Past Medical History:  Diagnosis Date  . Anemia   . Arthritis    "knees, hips, possibly back" (04/08/2016)  . Atrial septal aneurysm   . B12 deficiency    a. following gastric bypass.  . Back pain   . Chronic lower back pain    spondylosis  . Cold feet   . Daily headache    "short ones for the last month or 2" (04/08/2016)  . Depression   . Diabetes ([place])   . Dry mouth   . DVT (deep venous thrombosis) ([place])   . Easy bruising   . Floaters in visual field   . GERD (gastroesophageal reflux disease)    takes Protonix daily "just to protect my stomach; not for reflux" (04/08/2016)  . Hay fever   . Heart murmur    "dx'd by 1 anesthesiologist years and years ago"  . History of loop recorder 03/2016  . Hyperlipidemia   . Hypertension   . Joint pain   . Leg cramp   . Macular degeneration    "just watching"  . Neuropathy   . Nosebleed   . Palpitations   . Peripheral edema   . Peripheral neuropathy    not on any meds  . 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  [name] Pneumonia 12/2017  . Sleep apnea    no CPAP since weight loss after bariatric surgery '12 (04/08/2016)  . Spondylosis   . Stroke ([place]) 04/07/2016   a. 01/2016  and 03/2016. during admission had + LE DVT.  [name] Swelling of extremity   . TIA (transient ischemic attack)   . Trouble in sleeping   . Urinary incontinence   . Weakness     PAST SURGICAL HISTORY: Past Surgical History:  Procedure Laterality Date  . CATARACT EXTRACTION W/ INTRAOCULAR LENS  IMPLANT, BILATERAL Bilateral ~ 2012  . COLONOSCOPY    . EP IMPLANTABLE DEVICE N/A 04/10/2016   Procedure: Loop Recorder Insertion;  Surgeon: Will Meredith Leeds, MD;  Location: [facility];  Service: Cardiovascular;  Laterality: N/A;  . ESOPHAGOGASTRODUODENOSCOPY     in the 70's  . FACIAL COSMETIC SURGERY    . JOINT REPLACEMENT    . 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 SINUS ENDO W/FUSION  02/28/2016  Procedure: ENDOSCOPIC SINUS SURGERY WITH NAVIGATION;  Surgeon: Ruby Cola, MD;  Location: [facility];  Service: ENT;;  . TEE WITHOUT CARDIOVERSION N/A 04/10/2016   Procedure: TRANSESOPHAGEAL ECHOCARDIOGRAM (TEE) POSSIBLE LOOP;  Surgeon: Sanda Klein, MD;  Location: [facility];  Service: Cardiovascular;  Laterality: N/A;  . TONSILLECTOMY  1957  . TOTAL HIP ARTHROPLASTY Right 2008  . TOTAL HIP REVISION Right 03/30/2016   Procedure: RIGHT TOTAL HIP REVISION;  Surgeon: Frederik Pear, MD;  Location: [facility];  Service: Orthopedics;  Laterality: Right;  . TOTAL KNEE ARTHROPLASTY Bilateral 2001  . VAGINAL HYSTERECTOMY     "left my ovaries"    SOCIAL HISTORY: Social History   Tobacco Use  . Smoking status: Former Smoker    Packs/day: 1.00    Years: 40.00    Pack years: 40.00    Types: Cigarettes    Quit date: 08/2006    Years since quitting: 12.7  . Smokeless tobacco: Never Used  Substance Use Topics  . Alcohol use: Yes    Alcohol/week: 0.0 standard drinks    Comment: 04/08/2016 "5-6 drinks/year'  . Drug use: No    Types: Marijuana    Comment: 04/08/2016 "did some marijuana in college"    FAMILY HISTORY: Family History  Problem Relation Age of Onset  . Neuropathy Mother   . Hypertension  Mother   . Hyperlipidemia Mother   . Thyroid disease Mother   . Prostate cancer Father   . Lymphoma Father   . Hypertension Father   . Hyperlipidemia Father   . Cancer Father   . Obesity Father   . Congestive Heart Failure Maternal Grandmother   . Lung cancer Maternal Grandfather     ROS: Review of Systems  Constitutional: Positive for weight loss.  Neurological: Positive for dizziness.    PHYSICAL EXAM: Blood pressure 103/66, pulse 80, temperature 98.5 F (36.9 C), temperature source Oral, height _0  (1.6 m), weight 171 lb 6.4 oz (77.7 kg), SpO2 97 %. Body mass index is 30.36 kg/m. Physical Exam Vitals signs reviewed.  Constitutional:      Appearance: Normal appearance. She is well-developed. She is obese.  Cardiovascular:     Rate and Rhythm: Normal rate.  Pulmonary:     Effort: Pulmonary effort is normal.  Musculoskeletal: Normal range of motion.  Skin:    General: Skin is warm and dry.  Neurological:     Mental Status: She is alert and oriented to person, place, and time.  Psychiatric:        Mood and Affect: Mood normal.        Behavior: Behavior normal.     RECENT LABS AND TESTS: BMET    Component Value Date/Time   NA 139 06/04/2019 1050   NA 139 06/01/2019 1015   NA 139 04/29/2017 0944   K 3.9 06/04/2019 1050   K 3.9 04/29/2017 0944   CL 105 06/04/2019 1050   CO2 26 06/04/2019 1050   CO2 29 04/29/2017 0944   GLUCOSE 132 (H) 06/04/2019 1050   GLUCOSE 120 04/29/2017 0944   BUN 13 06/04/2019 1050   BUN 23 06/01/2019 1015   BUN 15.8 04/29/2017 0944   CREATININE 0.75 06/04/2019 1050   CREATININE 0.8 04/29/2017 0944   CALCIUM 9.0 06/04/2019 1050   CALCIUM 9.2 04/29/2017 0944   GFRNONAA >60 06/04/2019 1050   GFRAA >60 06/04/2019 1050   Lab Results  Component Value Date   HGBA1C 4.8 05/04/2019   HGBA1C 5.1 11/03/2018   HGBA1C 5.4 08/02/2018  HGBA1C 6.9 (H) 04/27/2018   HGBA1C 6.0 (H) 04/09/2016   Lab Results  Component Value Date    INSULIN 6.9 08/02/2018   INSULIN 7.0 04/27/2018   CBC    Component Value Date/Time   WBC 8.0 06/04/2019 1050   RBC 4.77 06/04/2019 1050   HGB 13.6 06/04/2019 1050   HGB 13.5 05/04/2019 1025   HGB 11.9 04/29/2017 0944   HCT 42.2 06/04/2019 1050   HCT 43.1 05/04/2019 1025   HCT 36.9 04/29/2017 0944   PLT 244 06/04/2019 1050   PLT 275 04/27/2018 0000   MCV 88.5 06/04/2019 1050   MCV 87 05/04/2019 1025   MCV 81.5 04/29/2017 0944   MCH 28.5 06/04/2019 1050   MCHC 32.2 06/04/2019 1050   RDW 15.4 06/04/2019 1050   RDW 18.0 (H) 05/04/2019 1025   RDW 14.2 04/29/2017 0944   LYMPHSABS 1.1 05/04/2019 1025   LYMPHSABS 1.7 04/29/2017 0944   MONOABS 0.6 04/29/2017 0944   EOSABS 0.1 05/04/2019 1025   BASOSABS 0.1 05/04/2019 1025   BASOSABS 0.0 04/29/2017 0944   Iron/TIBC/Ferritin/ %Sat    Component Value Date/Time   IRON 28 11/03/2018 1545   IRON 51 04/29/2017 0944   TIBC 335 11/03/2018 1545   TIBC 343 04/29/2017 0944   FERRITIN 34 11/03/2018 1545   FERRITIN 23 04/29/2017 0944   IRONPCTSAT 8 (LL) 11/03/2018 1545   IRONPCTSAT 15 (L) 04/29/2017 0944   IRONPCTSAT 12 (L) 05/29/2011 1436   Lipid Panel     Component Value Date/Time   CHOL 152 11/03/2018 1545   TRIG 109 11/03/2018 1545   HDL 61 11/03/2018 1545   CHOLHDL 2.7 04/09/2016 0155   VLDL 27 04/09/2016 0155   LDLCALC 69 11/03/2018 1545   Hepatic Function Panel     Component Value Date/Time   PROT 6.5 06/01/2019 1015   PROT 6.8 04/29/2017 0944   ALBUMIN 4.4 06/01/2019 1015   ALBUMIN 3.4 (L) 04/29/2017 0944   AST 30 06/01/2019 1015   AST 17 04/29/2017 0944   ALT 19 06/01/2019 1015   ALT 14 04/29/2017 0944   ALKPHOS 66 06/01/2019 1015   ALKPHOS 116 04/29/2017 0944   BILITOT 0.4 06/01/2019 1015   BILITOT 0.41 04/29/2017 0944   Lab Results  Component Value Date   TSH 2.100 05/04/2019      Ref. Range 05/04/2019 10:25  Vitamin D, 25-Hydroxy Latest Ref Range: 30.0 - 100.0 ng/mL 50.7    OBESITY BEHAVIORAL  INTERVENTION VISIT  Today's visit was # 27  Starting weight: 258 Starting date: 04/27/18 Today's weight : 171 lbs Today's date: 06/01/2019 Total lbs lost to date: 87 lbs    06/01/2019  Height _0  (1.6 m)  Weight 171 lb 6.4 oz (77.7 kg)  BMI (Calculated) 30.37  BLOOD PRESSURE - SYSTOLIC 017  BLOOD PRESSURE - DIASTOLIC 66   Body Fat % 79.3 %  Total Body Water (lbs) 74.8 lbs    ASK: We discussed the diagnosis of obesity with [name] [name] today and [place] agreed to give [place] permission to discuss obesity behavioral modification therapy today.  ASSESS: [name] [name] has the diagnosis of obesity and her BMI today is 30.37 [name] [name] is in the action stage of change   ADVISE: [name] [name] was educated on the multiple health risks of obesity as well as the benefit of weight loss to improve her health. She was advised of the need for long term treatment and the importance of lifestyle modifications to improve her current health and to decrease  her risk of future health problems.  AGREE: Multiple dietary modification options and treatment options were discussed and  [name] [name] agreed to follow the recommendations documented in the above note.  ARRANGE: [name] [name] was educated on the importance of frequent visits to treat obesity as outlined per CMS and USPSTF guidelines and agreed to schedule her next follow up appointment today.  I, Doreene Nest, am acting as transcriptionist for [name], FNP-C  I have reviewed the above documentation for accuracy and completeness, and I agree with the above.  - Jaleena Viviani, FNP-C.

## 2019-06-06 DIAGNOSIS — H8110 Benign paroxysmal vertigo, unspecified ear: Secondary | ICD-10-CM | POA: Diagnosis not present

## 2019-06-07 ENCOUNTER — Encounter (INDEPENDENT_AMBULATORY_CARE_PROVIDER_SITE_OTHER): Payer: Self-pay | Admitting: Family Medicine

## 2019-06-07 DIAGNOSIS — R600 Localized edema: Secondary | ICD-10-CM | POA: Insufficient documentation

## 2019-06-07 DIAGNOSIS — R609 Edema, unspecified: Secondary | ICD-10-CM | POA: Insufficient documentation

## 2019-06-08 DIAGNOSIS — H8111 Benign paroxysmal vertigo, right ear: Secondary | ICD-10-CM | POA: Diagnosis not present

## 2019-06-08 DIAGNOSIS — R262 Difficulty in walking, not elsewhere classified: Secondary | ICD-10-CM | POA: Diagnosis not present

## 2019-06-08 DIAGNOSIS — H818X1 Other disorders of vestibular function, right ear: Secondary | ICD-10-CM | POA: Diagnosis not present

## 2019-06-08 DIAGNOSIS — H8110 Benign paroxysmal vertigo, unspecified ear: Secondary | ICD-10-CM | POA: Diagnosis not present

## 2019-06-08 DIAGNOSIS — R2681 Unsteadiness on feet: Secondary | ICD-10-CM | POA: Diagnosis not present

## 2019-06-12 DIAGNOSIS — R2681 Unsteadiness on feet: Secondary | ICD-10-CM | POA: Diagnosis not present

## 2019-06-12 DIAGNOSIS — R262 Difficulty in walking, not elsewhere classified: Secondary | ICD-10-CM | POA: Diagnosis not present

## 2019-06-12 DIAGNOSIS — H8110 Benign paroxysmal vertigo, unspecified ear: Secondary | ICD-10-CM | POA: Diagnosis not present

## 2019-06-12 DIAGNOSIS — H8111 Benign paroxysmal vertigo, right ear: Secondary | ICD-10-CM | POA: Diagnosis not present

## 2019-06-12 DIAGNOSIS — H818X1 Other disorders of vestibular function, right ear: Secondary | ICD-10-CM | POA: Diagnosis not present

## 2019-06-14 DIAGNOSIS — R262 Difficulty in walking, not elsewhere classified: Secondary | ICD-10-CM | POA: Diagnosis not present

## 2019-06-14 DIAGNOSIS — H8111 Benign paroxysmal vertigo, right ear: Secondary | ICD-10-CM | POA: Diagnosis not present

## 2019-06-14 DIAGNOSIS — H818X1 Other disorders of vestibular function, right ear: Secondary | ICD-10-CM | POA: Diagnosis not present

## 2019-06-14 DIAGNOSIS — H8110 Benign paroxysmal vertigo, unspecified ear: Secondary | ICD-10-CM | POA: Diagnosis not present

## 2019-06-14 DIAGNOSIS — R2681 Unsteadiness on feet: Secondary | ICD-10-CM | POA: Diagnosis not present

## 2019-06-15 ENCOUNTER — Encounter (INDEPENDENT_AMBULATORY_CARE_PROVIDER_SITE_OTHER): Payer: Self-pay | Admitting: Family Medicine

## 2019-06-15 NOTE — Telephone Encounter (Signed)
Please see patient's response

## 2019-06-19 ENCOUNTER — Ambulatory Visit (INDEPENDENT_AMBULATORY_CARE_PROVIDER_SITE_OTHER): Payer: Medicare Other | Admitting: *Deleted

## 2019-06-19 DIAGNOSIS — I639 Cerebral infarction, unspecified: Secondary | ICD-10-CM

## 2019-06-20 LAB — CUP PACEART REMOTE DEVICE CHECK
Date Time Interrogation Session: 20200929191114
Implantable Pulse Generator Implant Date: 20170721

## 2019-06-21 DIAGNOSIS — R262 Difficulty in walking, not elsewhere classified: Secondary | ICD-10-CM | POA: Diagnosis not present

## 2019-06-21 DIAGNOSIS — H818X1 Other disorders of vestibular function, right ear: Secondary | ICD-10-CM | POA: Diagnosis not present

## 2019-06-21 DIAGNOSIS — H8111 Benign paroxysmal vertigo, right ear: Secondary | ICD-10-CM | POA: Diagnosis not present

## 2019-06-21 DIAGNOSIS — R2681 Unsteadiness on feet: Secondary | ICD-10-CM | POA: Diagnosis not present

## 2019-06-21 DIAGNOSIS — H8110 Benign paroxysmal vertigo, unspecified ear: Secondary | ICD-10-CM | POA: Diagnosis not present

## 2019-06-22 ENCOUNTER — Other Ambulatory Visit: Payer: Self-pay

## 2019-06-22 ENCOUNTER — Ambulatory Visit
Admission: RE | Admit: 2019-06-22 | Discharge: 2019-06-22 | Disposition: A | Discharge status: Home / Self Care | Payer: Medicare Other | Source: Ambulatory Visit | Attending: Family Medicine | Admitting: Family Medicine

## 2019-06-22 DIAGNOSIS — Z1231 Encounter for screening mammogram for malignant neoplasm of breast: Secondary | ICD-10-CM | POA: Diagnosis not present

## 2019-06-23 DIAGNOSIS — H8110 Benign paroxysmal vertigo, unspecified ear: Secondary | ICD-10-CM | POA: Diagnosis not present

## 2019-06-23 DIAGNOSIS — H8111 Benign paroxysmal vertigo, right ear: Secondary | ICD-10-CM | POA: Diagnosis not present

## 2019-06-23 DIAGNOSIS — R2681 Unsteadiness on feet: Secondary | ICD-10-CM | POA: Diagnosis not present

## 2019-06-23 DIAGNOSIS — R262 Difficulty in walking, not elsewhere classified: Secondary | ICD-10-CM | POA: Diagnosis not present

## 2019-06-23 DIAGNOSIS — H818X1 Other disorders of vestibular function, right ear: Secondary | ICD-10-CM | POA: Diagnosis not present

## 2019-06-26 ENCOUNTER — Encounter: Payer: Self-pay | Admitting: Family Medicine

## 2019-06-26 ENCOUNTER — Ambulatory Visit (INDEPENDENT_AMBULATORY_CARE_PROVIDER_SITE_OTHER): Payer: Medicare Other | Admitting: Family Medicine

## 2019-06-26 ENCOUNTER — Encounter (INDEPENDENT_AMBULATORY_CARE_PROVIDER_SITE_OTHER): Payer: Self-pay | Admitting: Family Medicine

## 2019-06-26 ENCOUNTER — Other Ambulatory Visit: Payer: Self-pay

## 2019-06-26 VITALS — BP 128/78 | HR 74 | Temp 98.0°F | Ht 63.0 in | Wt 174.0 lb

## 2019-06-26 DIAGNOSIS — E669 Obesity, unspecified: Secondary | ICD-10-CM

## 2019-06-26 DIAGNOSIS — K5909 Other constipation: Secondary | ICD-10-CM

## 2019-06-26 DIAGNOSIS — R5383 Other fatigue: Secondary | ICD-10-CM | POA: Diagnosis not present

## 2019-06-26 DIAGNOSIS — R0602 Shortness of breath: Secondary | ICD-10-CM

## 2019-06-26 DIAGNOSIS — Z6830 Body mass index (BMI) 30.0-30.9, adult: Secondary | ICD-10-CM

## 2019-06-26 DIAGNOSIS — E119 Type 2 diabetes mellitus without complications: Secondary | ICD-10-CM | POA: Diagnosis not present

## 2019-06-27 DIAGNOSIS — R262 Difficulty in walking, not elsewhere classified: Secondary | ICD-10-CM | POA: Diagnosis not present

## 2019-06-27 DIAGNOSIS — H8110 Benign paroxysmal vertigo, unspecified ear: Secondary | ICD-10-CM | POA: Diagnosis not present

## 2019-06-27 DIAGNOSIS — R2681 Unsteadiness on feet: Secondary | ICD-10-CM | POA: Diagnosis not present

## 2019-06-27 DIAGNOSIS — H8111 Benign paroxysmal vertigo, right ear: Secondary | ICD-10-CM | POA: Diagnosis not present

## 2019-06-27 DIAGNOSIS — H818X1 Other disorders of vestibular function, right ear: Secondary | ICD-10-CM | POA: Diagnosis not present

## 2019-06-28 ENCOUNTER — Encounter (INDEPENDENT_AMBULATORY_CARE_PROVIDER_SITE_OTHER): Payer: Self-pay | Admitting: Family Medicine

## 2019-06-28 NOTE — Progress Notes (Signed)
[facility] Summary Report / Loop Recorder

## 2019-06-28 NOTE — Progress Notes (Addendum)
Office: [phone]  /  Fax: [phone]   HPI:   Chief Complaint: OBESITY [name] [name] is here to discuss her progress with her obesity treatment plan. She is on the Category 2 plan and is following her eating plan approximately 50 % of the time. She states she is exercising 0 minutes 0 times per week. [name] [name] has had vertigo, her dog died, and she has been off track.  She is now back on track. [name] [name] has done wonderful on the plan overall. Her start weight was 258 pounds on 04/27/2018. Her weight is 174 lb (78.9 kg) today and has had a weight gain of 3 pounds over a period of 3 weeks since her last visit. She has lost 84 lbs since starting treatment with [place].  Diabetes II [name] [name] has a diagnosis of diabetes type II and she is on metformin. She is well controlled. Her last A1c was at 4.9.  She has been working on intensive lifestyle modifications including diet, exercise, and weight loss to help control her blood glucose levels.  Constipation [name] [name] has constipation, and she has been trying metamucil, but her stool is still hard. She denies hematochezia or hemorrhoids.   Fatigue [name] [name] feels her energy is lower than it should be. This has worsened with weight gain and has worsened recently. [name] [name] denies daytime somnolence and patient reports sleeping at least 7 hors per night.  Dyspnea with Exercise [name] [name] notes increasing shortness of breath with exercising and seems to be worsening over time with weight gain. She notes getting out of breath sooner with activity than she used to. This has not gotten worse recently. [name] [name] denies shortness of breath at rest or orthopnea.  ASSESSMENT AND PLAN:  Type 2 diabetes mellitus without complication, without long-term current use of insulin (HCC)  Other constipation  Class 1 obesity with serious comorbidity and body mass index (BMI) of 30.0 to 30.9 in adult, unspecified obesity type  PLAN:  Diabetes II Brendalee has been given extensive diabetes  education by myself today including ideal fasting and post-prandial blood glucose readings, individual ideal Hgb A1c goals and hypoglycemia prevention. We discussed the importance of good blood sugar control to decrease the likelihood of diabetic complications such as nephropathy, neuropathy, limb loss, blindness, coronary artery disease, and death. We discussed the importance of intensive lifestyle modification including diet, exercise and weight loss as the first line treatment for diabetes. Kailany will continue metformin and follow up at the agreed upon time.  Constipation Ashara was informed decrease bowel movement frequency is normal while losing weight, but stools should not be hard or painful. She was advised to increase her H20 intake and work on increasing her fiber intake. Rahaf will try stool softener daily and follow up as directed.  Fatigue Shoua was informed that her fatigue may be related to obesity, depression or many other causes. Labs will be ordered, and in the meanwhile Emmarie has agreed to work on diet, exercise and weight loss to help with fatigue. Proper sleep hygiene was discussed including the need for 7-8 hours of sleep. Indirect Calorimetry was ordered today and shows an increased RMR of 610. This increased from 1505 to 2115.  Dyspnea with exercise [name] [name]'s shortness of breath appears to be obesity related and exercise induced. The indirect calorimeter results showed  a REE of 2115. She has agreed to work on weight loss and gradually increase exercise to treat her exercise induced shortness of breath. If Ryana follows our instructions and loses weight without improvement of  her shortness of breath, we will plan to refer to pulmonology. [name] [name] agrees to this plan.  Obesity [name] [name] is currently in the action stage of change. As such, her goal is to continue with weight loss efforts She has agreed to follow the Category 2 plan +300 calories (optional) Winter will start back  exercising at the gym for weight loss and overall health benefits. We discussed the following Behavioral Modification Strategies today: planning for success, increasing lean protein intake and decreasing simple carbohydrates   [name] [name] has agreed to follow up with our clinic in 2 weeks. She was informed of the importance of frequent follow up visits to maximize her success with intensive lifestyle modifications for her multiple health conditions.  ALLERGIES: Allergies  Allergen Reactions  . Ambien [Zolpidem] Other (See Comments)    NIGHT TERRORS  . Atorvastatin Other (See Comments)    Muscle weakness Muscle Aches  . Codeine Nausea And Vomiting  . Penicillins Itching and Other (See Comments)    Made nose run uncontrollably Has patient had a PCN reaction causing immediate rash, facial/tongue/throat swelling, SOB or lightheadedness with hypotension: No Has patient had a PCN reaction causing severe rash involving mucus membranes or skin necrosis: No Has patient had a PCN reaction that required hospitalization No Has patient had a PCN reaction occurring within the last 10 years: No If all of the above answers are "NO", then may proceed with Cephalosporin use.  . Simvastatin Other (See Comments)    Muscle weakness  . Byetta 10 Mcg Pen [Exenatide] Nausea Only    MEDICATIONS: Current Outpatient Medications on File Prior to Visit  Medication Sig Dispense Refill  . cholecalciferol (VITAMIN D3) 25 MCG (1000 UT) tablet Take 5,000 Units by mouth daily.    . diazepam (VALIUM) 5 MG tablet Take 5 mg by mouth every 6 (six) hours as needed for anxiety.    . ferrous sulfate 325 (65 FE) MG tablet Take 325 mg by mouth 2 (two) times daily with a meal.    . gabapentin (NEURONTIN) 300 MG capsule Take 2 capsules (600 mg total) by mouth at bedtime. 180 capsule 4  . hydrochlorothiazide (HYDRODIURIL) 12.5 MG tablet Take 12.5 mg by mouth daily.    . magnesium oxide (MAG-OX) 400 MG tablet Take 400 mg by mouth at  bedtime.     . meclizine (ANTIVERT) 25 MG tablet Take 1 tablet (25 mg total) by mouth 3 (three) times daily as needed for dizziness. 30 tablet 0  . metFORMIN (GLUCOPHAGE) 500 MG tablet Take 1 tablet (500 mg total) by mouth daily with breakfast. 90 tablet 0  . Multiple Vitamins-Minerals (OCUVITE-LUTEIN PO) Take 1 tablet by mouth daily.    [name] OVER THE COUNTER MEDICATION P5P vitamin    . rosuvastatin (CRESTOR) 20 MG tablet Take 1 tablet (20 mg total) by mouth daily. (Patient taking differently: Take 20 mg by mouth at bedtime. ) 30 tablet 0  . XARELTO 20 MG TABS tablet Take 20 mg by mouth daily.     No current facility-administered medications on file prior to visit.     PAST MEDICAL HISTORY: Past Medical History:  Diagnosis Date  . Anemia   . Arthritis    "knees, hips, possibly back" (04/08/2016)  . Atrial septal aneurysm   . B12 deficiency    a. following gastric bypass.  . Back pain   . Chronic lower back pain    spondylosis  . Cold feet   . Daily headache    "short ones  for the last month or 2" (04/08/2016)  . Depression   . Diabetes ([place])   . Dry mouth   . DVT (deep venous thrombosis) ([place])   . Easy bruising   . Floaters in visual field   . GERD (gastroesophageal reflux disease)    takes Protonix daily "just to protect my stomach; not for reflux" (04/08/2016)  . Hay fever   . Heart murmur    "dx'd by 1 anesthesiologist years and years ago"  . History of loop recorder 03/2016  . Hyperlipidemia   . Hypertension   . Joint pain   . Leg cramp   . Macular degeneration    "just watching"  . Neuropathy   . Nosebleed   . Palpitations   . Peripheral edema   . Peripheral neuropathy    not on any meds  . 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  [name] Pneumonia 12/2017  . Sleep apnea    no CPAP since weight loss after bariatric surgery '12 (04/08/2016)  . Spondylosis   . Stroke ([place]) 04/07/2016   a. 01/2016 and 03/2016. during admission had + LE DVT.  [name] Swelling  of extremity   . TIA (transient ischemic attack)   . Trouble in sleeping   . Urinary incontinence   . Weakness     PAST SURGICAL HISTORY: Past Surgical History:  Procedure Laterality Date  . CATARACT EXTRACTION W/ INTRAOCULAR LENS  IMPLANT, BILATERAL Bilateral ~ 2012  . COLONOSCOPY    . EP IMPLANTABLE DEVICE N/A 04/10/2016   Procedure: Loop Recorder Insertion;  Surgeon: Will Meredith Leeds, MD;  Location: [facility];  Service: Cardiovascular;  Laterality: N/A;  . ESOPHAGOGASTRODUODENOSCOPY     in the 70's  . FACIAL COSMETIC SURGERY    . JOINT REPLACEMENT    . 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 SINUS ENDO W/FUSION  02/28/2016   Procedure: ENDOSCOPIC SINUS SURGERY WITH NAVIGATION;  Surgeon: Ruby Cola, MD;  Location: [facility];  Service: ENT;;  . TEE WITHOUT CARDIOVERSION N/A 04/10/2016   Procedure: TRANSESOPHAGEAL ECHOCARDIOGRAM (TEE) POSSIBLE LOOP;  Surgeon: Sanda Klein, MD;  Location: [facility];  Service: Cardiovascular;  Laterality: N/A;  . TONSILLECTOMY  1957  . TOTAL HIP ARTHROPLASTY Right 2008  . TOTAL HIP REVISION Right 03/30/2016   Procedure: RIGHT TOTAL HIP REVISION;  Surgeon: Frederik Pear, MD;  Location: [facility];  Service: Orthopedics;  Laterality: Right;  . TOTAL KNEE ARTHROPLASTY Bilateral 2001  . VAGINAL HYSTERECTOMY     "left my ovaries"    SOCIAL HISTORY: Social History   Tobacco Use  . Smoking status: Former Smoker    Packs/day: 1.00    Years: 40.00    Pack years: 40.00    Types: Cigarettes    Quit date: 08/2006    Years since quitting: 12.8  . Smokeless tobacco: Never Used  Substance Use Topics  . Alcohol use: Yes    Alcohol/week: 0.0 standard drinks    Comment: 04/08/2016 "5-6 drinks/year'  . Drug use: No    Types: Marijuana    Comment: 04/08/2016 "did some marijuana in college"    FAMILY HISTORY: Family History  Problem Relation Age of Onset  . Neuropathy Mother   . Hypertension Mother   . Hyperlipidemia Mother   . Thyroid disease  Mother   . Prostate cancer Father   . Lymphoma Father   . Hypertension Father   . Hyperlipidemia Father   . Cancer Father   . Obesity  Father   . Congestive Heart Failure Maternal Grandmother   . Lung cancer Maternal Grandfather     ROS: Review of Systems  Constitutional: Negative for weight loss.  Gastrointestinal: Positive for constipation.       Negative for hematochezia Negative for hemorrhoids    PHYSICAL EXAM: Blood pressure 128/78, pulse 74, temperature 98 F (36.7 C), temperature source Oral, height _0  (1.6 m), weight 174 lb (78.9 kg), SpO2 98 %. Body mass index is 30.82 kg/m. Physical Exam Vitals signs reviewed.  Constitutional:      Appearance: Normal appearance. She is well-developed. She is obese.  Cardiovascular:     Rate and Rhythm: Normal rate.  Pulmonary:     Effort: Pulmonary effort is normal.  Musculoskeletal: Normal range of motion.  Skin:    General: Skin is warm and dry.  Neurological:     Mental Status: She is alert and oriented to person, place, and time.  Psychiatric:        Mood and Affect: Mood normal.        Behavior: Behavior normal.     RECENT LABS AND TESTS: BMET    Component Value Date/Time   NA 139 06/04/2019 1050   NA 139 06/01/2019 1015   NA 139 04/29/2017 0944   K 3.9 06/04/2019 1050   K 3.9 04/29/2017 0944   CL 105 06/04/2019 1050   CO2 26 06/04/2019 1050   CO2 29 04/29/2017 0944   GLUCOSE 132 (H) 06/04/2019 1050   GLUCOSE 120 04/29/2017 0944   BUN 13 06/04/2019 1050   BUN 23 06/01/2019 1015   BUN 15.8 04/29/2017 0944   CREATININE 0.75 06/04/2019 1050   CREATININE 0.8 04/29/2017 0944   CALCIUM 9.0 06/04/2019 1050   CALCIUM 9.2 04/29/2017 0944   GFRNONAA >60 06/04/2019 1050   GFRAA >60 06/04/2019 1050   Lab Results  Component Value Date   HGBA1C 4.8 05/04/2019   HGBA1C 5.1 11/03/2018   HGBA1C 5.4 08/02/2018   HGBA1C 6.9 (H) 04/27/2018   HGBA1C 6.0 (H) 04/09/2016   Lab Results  Component Value Date    INSULIN 6.9 08/02/2018   INSULIN 7.0 04/27/2018   CBC    Component Value Date/Time   WBC 8.0 06/04/2019 1050   RBC 4.77 06/04/2019 1050   HGB 13.6 06/04/2019 1050   HGB 13.5 05/04/2019 1025   HGB 11.9 04/29/2017 0944   HCT 42.2 06/04/2019 1050   HCT 43.1 05/04/2019 1025   HCT 36.9 04/29/2017 0944   PLT 244 06/04/2019 1050   PLT 275 04/27/2018 0000   MCV 88.5 06/04/2019 1050   MCV 87 05/04/2019 1025   MCV 81.5 04/29/2017 0944   MCH 28.5 06/04/2019 1050   MCHC 32.2 06/04/2019 1050   RDW 15.4 06/04/2019 1050   RDW 18.0 (H) 05/04/2019 1025   RDW 14.2 04/29/2017 0944   LYMPHSABS 1.1 05/04/2019 1025   LYMPHSABS 1.7 04/29/2017 0944   MONOABS 0.6 04/29/2017 0944   EOSABS 0.1 05/04/2019 1025   BASOSABS 0.1 05/04/2019 1025   BASOSABS 0.0 04/29/2017 0944   Iron/TIBC/Ferritin/ %Sat    Component Value Date/Time   IRON 28 11/03/2018 1545   IRON 51 04/29/2017 0944   TIBC 335 11/03/2018 1545   TIBC 343 04/29/2017 0944   FERRITIN 34 11/03/2018 1545   FERRITIN 23 04/29/2017 0944   IRONPCTSAT 8 (LL) 11/03/2018 1545   IRONPCTSAT 15 (L) 04/29/2017 0944   IRONPCTSAT 12 (L) 05/29/2011 1436   Lipid Panel     Component  Value Date/Time   CHOL 152 11/03/2018 1545   TRIG 109 11/03/2018 1545   HDL 61 11/03/2018 1545   CHOLHDL 2.7 04/09/2016 0155   VLDL 27 04/09/2016 0155   LDLCALC 69 11/03/2018 1545   Hepatic Function Panel     Component Value Date/Time   PROT 6.5 06/01/2019 1015   PROT 6.8 04/29/2017 0944   ALBUMIN 4.4 06/01/2019 1015   ALBUMIN 3.4 (L) 04/29/2017 0944   AST 30 06/01/2019 1015   AST 17 04/29/2017 0944   ALT 19 06/01/2019 1015   ALT 14 04/29/2017 0944   ALKPHOS 66 06/01/2019 1015   ALKPHOS 116 04/29/2017 0944   BILITOT 0.4 06/01/2019 1015   BILITOT 0.41 04/29/2017 0944   Lab Results  Component Value Date   TSH 2.100 05/04/2019     Ref. Range 05/04/2019 10:25  Vitamin D, 25-Hydroxy Latest Ref Range: 30.0 - 100.0 ng/mL 50.7    OBESITY BEHAVIORAL  INTERVENTION VISIT  Today's visit was # 28  Starting weight: 258 lbs Starting date: 04/27/2018 Today's weight : 174 lbs Today's date: 06/26/2019 Total lbs lost to date: 77 At least 15 minutes were spent on discussing the following behavioral intervention visit.     06/26/2019  Height _0  (1.6 m)  Weight 174 lb (78.9 kg)  BMI (Calculated) 30.83  BLOOD PRESSURE - SYSTOLIC 540  BLOOD PRESSURE - DIASTOLIC 78   Body Fat % 08.6 %  RMR 2115    ASK: We discussed the diagnosis of obesity with Jefm Bryant today and Braelynne agreed to give [place] permission to discuss obesity behavioral modification therapy today.  ASSESS: Blima has the diagnosis of obesity and her BMI today is 30.83 Shauntelle is in the action stage of change   ADVISE: Irja was educated on the multiple health risks of obesity as well as the benefit of weight loss to improve her health. She was advised of the need for long term treatment and the importance of lifestyle modifications to improve her current health and to decrease her risk of future health problems.  AGREE: Multiple dietary modification options and treatment options were discussed and  Rosealie agreed to follow the recommendations documented in the above note.  ARRANGE: Lurae was educated on the importance of frequent visits to treat obesity as outlined per CMS and USPSTF guidelines and agreed to schedule her next follow up appointment today.  I, Doreene Nest, am acting as transcriptionist for [name], FNP-C  I have reviewed the above documentation for accuracy and completeness, and I agree with the above.  - Alick Lecomte, FNP-C.

## 2019-07-06 ENCOUNTER — Ambulatory Visit (INDEPENDENT_AMBULATORY_CARE_PROVIDER_SITE_OTHER): Payer: Medicare Other | Admitting: Neurology

## 2019-07-06 ENCOUNTER — Other Ambulatory Visit: Payer: Self-pay

## 2019-07-06 ENCOUNTER — Encounter: Payer: Self-pay | Admitting: Neurology

## 2019-07-06 ENCOUNTER — Telehealth: Payer: Self-pay | Admitting: *Deleted

## 2019-07-06 VITALS — BP 149/81 | HR 75 | Temp 97.2°F | Ht 63.0 in | Wt 174.0 lb

## 2019-07-06 DIAGNOSIS — I639 Cerebral infarction, unspecified: Secondary | ICD-10-CM

## 2019-07-06 DIAGNOSIS — R42 Dizziness and giddiness: Secondary | ICD-10-CM

## 2019-07-06 DIAGNOSIS — G609 Hereditary and idiopathic neuropathy, unspecified: Secondary | ICD-10-CM | POA: Diagnosis not present

## 2019-07-06 NOTE — Telephone Encounter (Signed)
Order form completed and signed for neuropathy genetic testing through [facility] ([facility]). Order faxed to [facility]. Received a receipt of confirmation. I also spoke with pt today and she is aware that they will be contacting her and will send the collection kit to her home for sputum collection.

## 2019-07-06 NOTE — Progress Notes (Signed)
[name]    Provider:  Dr Jaynee Eagles Referring Provider: [name], MD Primary Care Physician:  [name], MD   CC: Stroke, Cavernous sinus mass, dizziness  Interval history 07/09/2019: Can come off of xarelto for surgery. 3 years without afib. If we are asked happy to write her a note. She had a massage Wednesday and Thursday she had an epicode of vertigo, she turned over in bed and had a second episode. She felt dizzy for a few days and then again had imbalance and fell with dizziness. She had vestibular therapy and was better. Happy to give her valium as needed. She recently had epley maneuver teaching again and feels better.   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  In the past we discussed smoking which can cause cerebrovascular atherosclerosis.  We recommended the healthy weight and wellness center.  No signs or symptoms of sleep apnea, and had a negative sleep eval in the past.  She also had a cavernous sinus mass status post removal. She has had a loop recorder placed.  She is on Gabapentin for neuropathy, Xarelto for stroke prevention. Takes Vitamin B supplements. She had bariatric surgery, resolved OSA. Loop hasn;t shown any afib or aflutter. Continues the Xarelto. She goes yearly for MRi to evaluate for previous sinus mass. Gabapentin helps. She continues to take B12 daily. HgbA1c 6.2 up a bit. She follows for cholesterol. She is taking Calcium and vitamin D daily now since showing Osteopenia. Weight stable.   Interval history 01/18/2017:  Patient is here for follow up. 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 Feet are burning a little more. 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  Recommend the Healthy Weight and [place] to  address her obesity and multiple joint pain. She does not snore and is not tired during the day, had a borderline sleep evaluation in the past will monitor for OSA, The burning in her feet is worsening.  More noticeable at night. She is on Lipoic acid daily.   Interval history 07/20/2016: She has had neuropathy for years.She has had neuropathy for over 15 years or longer. It started in the feet. Numbness, burning in the feet, they feel cold to the touvh but she feels them burning, she has pain in the feet. She tried Lyrica possible in a neuropathy drug trial and she believe she had side effects. Worse at night in bed. Cramping in the feet.   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 She does not know how long she had B12 deficiency. Her last B12 was normal 843. She takes oral supplements. Also HgbA1c 6.0. TSH 2.070. She also has a history of diabetes which improved after bariatric surgery but still elevated 6.0. HgbA1c was as high as 7.7 in the past.   Interval history 06/09/2016: Since being seen patient has had another stroke. She was admitted 04/10/2016. She had newly identified clot by LE by doppler but TCD bubble study was negative. She was discharged on Xarelto. Loop recorder was placed. She presented to the ED with slurred speech. Most symptoms have resolved. She cannot walk very well, her legs and feet are very numb. She has numbness in the feet and legs. She has imbalance. And she has had  a history of B12 deficiency. She still has the mouth numbness and right hand sensory changes from previous stroke but the aphasia is resolved.   Ct Head Wo Contrast 04/08/2016 1. 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 2. New focal low-density in the left posterior frontal periventricular white matter, potentially a subacute small vessel infarct. Extensive chronic small vessel ischemic changes in the periventricular  white matter are otherwise stable. No evidence of acute cortical stroke or hemorrhage.   Mr Brain Wo Contrast 04/08/2016 1. Confirmation of acute nonhemorrhagic white matter infarct involving the left corona radiata and posterior centrum semi of ally. 2. Otherwise stable severe periventricular and diffuse subcortical T2 hyperintensities, advanced for age. This represents the sequela of chronic microvascular ischemia. 3. 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   Ct Angio Head & Neck W Or Wo Contrast 04/09/2016 1. Positive for arterial abnormalities associated with the acute white matter ischemia: Severe stenosis at the origin of a left MCA middle sylvian division M2 branch. Moderate irregularity and stenosis in anterior division left M2/ M3 branches. No left MCA M1 stenosis or occlusion. 2. Otherwise carotid bifurcation and ICA siphon atherosclerosis without hemodynamically significant stenosis. 3. Negative posterior circulation. 4. Stable CT appearance of the brain since yesterday. No associated hemorrhage or mass effect. 5. Abnormal CT appearance of the sella turcica and left cavernous sinus corresponding to the chronic invasive pituitary adenoma. 6. No acute findings in the neck.   LE venous dopplers Right lower extremity is negativefor deep vein thrombosis. The left lower extremity is positivefor deep vein thrombosis involving a small segment of the left mid femoral vein.There is no evidence of Baker's cyst bilaterally  TEE Normal TEE. No cardioembolic source.  TCD bubble study - negative for PFO  JSE:GBTDVV [name] a 68 y.o.femalehere as a follow-up from the hospital after a stroke. She still has numbness in the right fingers and moth due to the left thalamic stroke. We discussed her strokes, she is due for Cardiac Monitoring and then possibly Loop. Will discuss with our Vascular doctors.  Also discussed at length the invasive pituitary tumor shown, I reviewed imaging with patient and pointed out findingd, discussed next steps including MRI w/wo with pituitary protocol as well as referral to [facility].She was not on an aspirin when this happened.   Reviewed notes, labs and imaging from outside physicians, which showed:  IMPRESSION: 1. Acute lacunar infarct in the left thalamus. 2. Possible subacute lacunar infarct in the right splenium of the corpus callosum. 3. Moderate chronic small vessel ischemic disease. 4. Mass involving the sella, left cavernous sinus, and left sphenoid sinus, suspicious for invasive pituitary macroadenoma. Further evaluation with nonemergent pituitary protocol MRI (without and with contrast) is recommended. NEUROSURGERY REVIEWED AND FEELS THIS IS A MASS OF THE SPHENOID SINUS THAT HAS INVADED THE CAVERNOUS SINUS,NOT PITUITARY TUMOR 5. No major intracranial arterial occlusion or significant proximal posterior circulation stenosis. 6. Severe proximal left M2 MCA stenoses.  Review of Systems: Patient complains of symptoms per HPI as well as the following symptoms: No CP, no SOB, no fever, no chills. Pertinent negatives per HPI. All others negative.   Social History   Socioeconomic History  . Marital status: Single    Spouse name: Not on file  . Number of children: 0  . Years of education: college  . Highest education level: Not on file  Occupational History  . Occupation: Retired  Scientific laboratory technician  . Financial resource strain: Not on file  . Food insecurity    Worry: Not on file    Inability: Not on file  . Transportation needs    Medical: Not on file    Non-medical: Not on file  Tobacco Use  . Smoking status: Former Smoker    Packs/day: 1.00    Years: 40.00    Pack years: 40.00    Types: Cigarettes    Quit date: 08/2006    Years since quitting: 12.8  . Smokeless tobacco: Never Used  Substance and Sexual Activity  . Alcohol use: Yes     Alcohol/week: 2.0 standard drinks    Types: 2 Glasses of wine per week    Comment: 07/06/2019 maybe 2 glasses of wine/week; 04/08/2016 "5-6 drinks/year'  . Drug use: No    Types: Marijuana    Comment: 04/08/2016 "did some marijuana in college"  . Sexual activity: Never    Birth control/protection: Surgical  Lifestyle  . Physical activity    Days per week: Not on file    Minutes per session: Not on file  . Stress: Not on file  Relationships  . Social Herbalist on phone: Not on file    Gets together: Not on file    Attends religious service: Not on file    Active member of club or organization: Not on file    Attends meetings of clubs or organizations: Not on file    Relationship status: Not on file  . Intimate partner violence    Fear of current or ex partner: Not on file    Emotionally abused: Not on file    Physically abused: Not on file    Forced sexual activity: Not on file  Other Topics Concern  . Not on file  Social History Narrative   Lives at home alone   Right-handed   Drinks 4 cups of coffee a day     Family History  Problem Relation Age of Onset  . Neuropathy Mother   . Hypertension Mother   . Hyperlipidemia Mother   . Thyroid disease Mother   . Prostate cancer Father   . Lymphoma Father   . Hypertension Father   . Hyperlipidemia Father   . Cancer Father   . Obesity Father   . Congestive Heart Failure Maternal Grandmother   . Lung cancer Maternal Grandfather     Past Medical History:  Diagnosis Date  . Anemia   . Arthritis    "knees, hips, possibly back" (04/08/2016)  . Atrial septal aneurysm   . B12 deficiency    a. following gastric bypass.  . Back pain   . Chronic lower back pain    spondylosis  . Cold feet   . Daily headache    "short ones for the last month or 2" (04/08/2016)  . Depression   . Diabetes ([place])   . Dry mouth   . DVT (deep venous thrombosis) ([place])   . Easy bruising   . Floaters in visual field   . GERD  (gastroesophageal reflux disease)    takes Protonix daily "just to protect my stomach; not for reflux" (04/08/2016)  . Hay fever   . Heart murmur    "dx'd by 1 anesthesiologist years and years ago"  . History of loop recorder 03/2016  . Hyperlipidemia   . Hypertension   . Joint pain   . Leg cramp   .  Macular degeneration    "just watching"  . Neuropathy   . Nosebleed   . Palpitations   . Peripheral edema   . Peripheral neuropathy    not on any meds  . Pituitary mass ([place])    a. s/p endoscopic surgery 02/2016.  [name] Pneumonia 12/2017  . Sleep apnea    no CPAP since weight loss after bariatric surgery '12 (04/08/2016)  . Spondylosis   . Stroke ([place]) 04/07/2016   a. 01/2016 and 03/2016. during admission had + LE DVT.  [name] Swelling of extremity   . TIA (transient ischemic attack)   . Trouble in sleeping   . Urinary incontinence   . Weakness     Past Surgical History:  Procedure Laterality Date  . CATARACT EXTRACTION W/ INTRAOCULAR LENS  IMPLANT, BILATERAL Bilateral ~ 2012  . COLONOSCOPY    . EP IMPLANTABLE DEVICE N/A 04/10/2016   Procedure: Loop Recorder Insertion;  Surgeon: Will Meredith Leeds, MD;  Location: [facility];  Service: Cardiovascular;  Laterality: N/A;  . ESOPHAGOGASTRODUODENOSCOPY     in the 70's  . FACIAL COSMETIC SURGERY    . JOINT REPLACEMENT    . 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 SINUS ENDO W/FUSION  02/28/2016   Procedure: ENDOSCOPIC SINUS SURGERY WITH NAVIGATION;  Surgeon: Ruby Cola, MD;  Location: [facility];  Service: ENT;;  . TEE WITHOUT CARDIOVERSION N/A 04/10/2016   Procedure: TRANSESOPHAGEAL ECHOCARDIOGRAM (TEE) POSSIBLE LOOP;  Surgeon: Sanda Klein, MD;  Location: [facility];  Service: Cardiovascular;  Laterality: N/A;  . TONSILLECTOMY  1957  . TOTAL HIP ARTHROPLASTY Right 2008  . TOTAL HIP REVISION Right 03/30/2016   Procedure: RIGHT TOTAL HIP REVISION;  Surgeon: Frederik Pear, MD;  Location: [facility];  Service: Orthopedics;  Laterality: Right;  .  TOTAL KNEE ARTHROPLASTY Bilateral 2001  . VAGINAL HYSTERECTOMY     "left my ovaries"    Current Outpatient Medications  Medication Sig Dispense Refill  . cholecalciferol (VITAMIN D3) 25 MCG (1000 UT) tablet Take 5,000 Units by mouth daily.    . diazepam (VALIUM) 2 MG tablet TAKE 1 TABLET BY MOUTH TWICE A DAY AS NEEDED FOR VERTIGO    . ferrous sulfate 325 (65 FE) MG tablet Take 325 mg by mouth 2 (two) times daily with a meal.    . gabapentin (NEURONTIN) 300 MG capsule Take 2 capsules (600 mg total) by mouth at bedtime. 180 capsule 4  . hydrochlorothiazide (HYDRODIURIL) 12.5 MG tablet Take 12.5 mg by mouth daily.    . magnesium oxide (MAG-OX) 400 MG tablet Take 400 mg by mouth at bedtime.     . metFORMIN (GLUCOPHAGE) 500 MG tablet Take 1 tablet (500 mg total) by mouth daily with breakfast. 90 tablet 0  . Multiple Vitamins-Minerals (OCUVITE-LUTEIN PO) Take 1 tablet by mouth daily.    [name] OVER THE COUNTER MEDICATION P5P vitamin    . rosuvastatin (CRESTOR) 20 MG tablet Take 1 tablet (20 mg total) by mouth daily. (Patient taking differently: Take 20 mg by mouth at bedtime. ) 30 tablet 0  . XARELTO 20 MG TABS tablet Take 20 mg by mouth daily.     No current facility-administered medications for this visit.     Allergies as of 07/06/2019 - Review Complete 07/06/2019  Allergen Reaction Noted  . Ambien [zolpidem] Other (See Comments) 03/27/2016  . Atorvastatin Other (See Comments) 02/24/2016  . Codeine Nausea And Vomiting 05/29/2011  . Penicillins Itching and Other (See Comments) 05/29/2011  . Simvastatin Other (  See Comments) 03/27/2016  . Meclizine  07/06/2019  . Byetta 10 mcg pen [exenatide] Nausea Only 03/27/2016    Vitals: BP (!) 149/81 (BP Location: Right Arm, Patient Position: Sitting)   Pulse 75   Temp (!) 97.2 F (36.2 C) Comment: taken at front door  Ht _0  (1.6 m)   Wt 174 lb (78.9 kg)   BMI 30.82 kg/m  Last Weight:  Wt Readings from Last 1 Encounters:  07/06/19 174 lb  (78.9 kg)   Last Height:   Ht Readings from Last 1 Encounters:  07/06/19 _1  (1.6 m)    Neuro: Detailed Neurologic Exam  Speech: Speech is normal; fluent and spontaneous with normal comprehension.  Cognition: The patient is oriented to person, place, and time;  recent and remote memory intact;  language fluent;  normal attention, concentration,  fund of knowledge Cranial Nerves: The pupils are equal, round, and reactive to light. The fundi are normal and spontaneous venous pulsations are present. Visual fields are full to finger confrontation. Extraocular movements are intact. Trigeminal sensation is intact and the muscles of mastication are normal. The face is symmetric. The palate elevates in the midline. Hearing intact. Voice is normal. Shoulder shrug is normal. The tongue has normal motion without fasciculations.   Coordination: Normal finger to nose and heel to shin. Normal rapid alternating movements.   Gait: Heel-toe and tandem gait are normal.   Motor Observation: No asymmetry, no atrophy, and no involuntary movements noted. Tone: Normal muscle tone.   Posture: Posture is normal. normal erect  Strength: Strength is V/V in the upper and lower limbs.   Sensation: Decreased pin prick to the knees, absent vibration to the ankles, intact propriceotpion.  Reflex Exam:  DTR's: Absent AJs  Toes: The toes are downgoing bilaterally.  Clonus: Clonus is absent.  Assessment and Plan:Ms.[name] [name] a 68 y.o.femalewith history of HTN, HLD,TIA/stroke, diet controlled DM type 2, peripheral neuropathy,s/p gastric bypass. . L corona radiata/posterior centrum semiovale infarct, etiology not clear. She had left thalamic and right splenium punctate infarcts in 01/2016, suspicious for cardioembolic infarcts. Pt does have severe L M2 inferior division stenosis and recent hypotension, but the distribution of M2  inferior does not fit into these infarcts. TEE normal and loop placed. Found to have DVT likely due to recent hip surgery but TCD bubble study no PFO.   - Can come off of xarelto for surgery. 3 years without afib. If we are asked happy to write her a note.  - Vertigo. She had vestibular therapy and was better. Happy to give her valium as needed. She recently had epley maneuver teaching again and feels better.  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 Risk factors include b12 deficiency and diabetes. Will test for other causes of neuropathy. neurontin at night.Alpha-lipoic acid daily. Discussed side effects as per patient instructions.  - Loop recorder placed 7/21 no afib or aflutter for close to 2 years - TCD bubble negative for PF - LDL, HgbA1c controlled - BP goal 130-150 due to left M2 severe stenosis - Continue statin  - HgbA1c 6.2, goal < 7.0 - Hx OSA, no CPAP since bariatric surgery in 2012, she denies any signs or symptoms no fatigue, not nodding off, nor morning headaches, no excessive fatigue, no known snoring. (she lost weight 336 was her max  weight now 255) - Recommend the healthy weight and wellness center: sent referral  No orders of the defined types were placed in this encounter.  - continue Xarelto - Continue neurontin for neuropathy  Cc; Dr. Tawny Asal, MD  [facility] [address] [place], [place] [address]  Phone [phone] Fax [phone] A total of 25 minutes was spent face-to-face with this patient. Over half this time was spent on counseling patient on the  1. Idiopathic peripheral neuropathy   2. Vertigo   3. Cryptogenic stroke [facility])    diagnosis and different diagnostic and therapeutic options, counseling  and coordination of care, risks ans benefits of management, compliance, or risk factor reduction and education.

## 2019-07-06 NOTE — Patient Instructions (Addendum)
Benign Positional Vertigo Vertigo is the feeling that you or your surroundings are moving when they are not. Benign positional vertigo is the most common form of vertigo. This is usually a harmless condition (benign). This condition is positional. This means that symptoms are triggered by certain movements and positions. This condition can be dangerous if it occurs while you are doing something that could cause harm to you or others. This includes activities such as driving or operating machinery. What are the causes? In many cases, the cause of this condition is not known. It may be caused by a disturbance in an area of the inner ear that helps your brain to sense movement and balance. This disturbance can be caused by:  Viral infection (labyrinthitis).  Head injury.  Repetitive motion, such as jumping, dancing, or running. What increases the risk? You are more likely to develop this condition if:  You are a woman.  You are 70 years of age or older. What are the signs or symptoms? Symptoms of this condition usually happen when you move your head or your eyes in different directions. Symptoms may start suddenly, and usually last for less than a minute. They include:  Loss of balance and falling.  Feeling like you are spinning or moving.  Feeling like your surroundings are spinning or moving.  Nausea and vomiting.  Blurred vision.  Dizziness.  Involuntary eye movement (nystagmus). Symptoms can be mild and cause only minor problems, or they can be severe and interfere with daily life. Episodes of benign positional vertigo may return (recur) over time. Symptoms may improve over time. How is this diagnosed? This condition may be diagnosed based on:  Your medical history.  Physical exam of the head, neck, and ears.  Tests, such as: ? MRI. ? CT scan. ? Eye movement tests. Your health care provider may ask you to change positions quickly while he or she watches you for symptoms  of benign positional vertigo, such as nystagmus. Eye movement may be tested with a variety of exams that are designed to evaluate or stimulate vertigo. ? An electroencephalogram (EEG). This records electrical activity in your brain. ? Hearing tests. You may be referred to a health care provider who specializes in ear, nose, and throat (ENT) problems (otolaryngologist) or a provider who specializes in disorders of the nervous system (neurologist). How is this treated?  This condition may be treated in a session in which your health care provider moves your head in specific positions to adjust your inner ear back to normal. Treatment for this condition may take several sessions. Surgery may be needed in severe cases, but this is rare. In some cases, benign positional vertigo may resolve on its own in 2-4 weeks. Follow these instructions at home: Safety  Move slowly. Avoid sudden body or head movements or certain positions, as told by your health care provider.  Avoid driving until your health care provider says it is safe for you to do so.  Avoid operating heavy machinery until your health care provider says it is safe for you to do so.  Avoid doing any tasks that would be dangerous to you or others if vertigo occurs.  If you have trouble walking or keeping your balance, try using a cane for stability. If you feel dizzy or unstable, sit down right away.  Return to your normal activities as told by your health care provider. Ask your health care provider what activities are safe for you. General instructions  Take over-the-counter  and prescription medicines only as told by your health care provider.  Drink enough fluid to keep your urine pale yellow.  Keep all follow-up visits as told by your health care provider. This is important. Contact a health care provider if:  You have a fever.  Your condition gets worse or you develop new symptoms.  Your family or friends notice any  behavioral changes.  You have nausea or vomiting that gets worse.  You have numbness or a "pins and needles" sensation. Get help right away if you:  Have difficulty speaking or moving.  Are always dizzy.  Faint.  Develop severe headaches.  Have weakness in your legs or arms.  Have changes in your hearing or vision.  Develop a stiff neck.  Develop sensitivity to light. Summary  Vertigo is the feeling that you or your surroundings are moving when they are not. Benign positional vertigo is the most common form of vertigo.  The cause of this condition is not known. It may be caused by a disturbance in an area of the inner ear that helps your brain to sense movement and balance.  Symptoms include loss of balance and falling, feeling that you or your surroundings are moving, nausea and vomiting, and blurred vision.  This condition can be diagnosed based on symptoms, physical exam, and other tests, such as MRI, CT scan, eye movement tests, and hearing tests.  Follow safety instructions as told by your health care provider. You will also be told when to contact your health care provider in case of problems. This information is not intended to replace advice given to you by your health care provider. Make sure you discuss any questions you have with your health care provider. Document Released: 06/15/2006 Document Revised: 02/16/2018 Document Reviewed: 02/16/2018 Elsevier Patient Education  2020 [employer].   Peripheral Neuropathy Peripheral neuropathy is a type of nerve damage. It affects nerves that carry signals between the spinal cord and the arms, legs, and the rest of the body (peripheral nerves). It does not affect nerves in the spinal cord or brain. In peripheral neuropathy, one nerve or a group of nerves may be damaged. Peripheral neuropathy is a broad category that includes many specific nerve disorders, like diabetic neuropathy, hereditary neuropathy, and carpal tunnel  syndrome. What are the causes? This condition may be caused by:  Diabetes. This is the most common cause of peripheral neuropathy.  Nerve injury.  Pressure or stress on a nerve that lasts a long time.  Lack (deficiency) of B vitamins. This can result from alcoholism, poor diet, or a restricted diet.  Infections.  Autoimmune diseases, such as rheumatoid arthritis and systemic lupus erythematosus.  Nerve diseases that are passed from parent to child (inherited).  Some medicines, such as cancer medicines (chemotherapy).  Poisonous (toxic) substances, such as lead and mercury.  Too little blood flowing to the legs.  Kidney disease.  Thyroid disease. In some cases, the cause of this condition is not known. What are the signs or symptoms? Symptoms of this condition depend on which of your nerves is damaged. Common symptoms include:  Loss of feeling (numbness) in the feet, hands, or both.  Tingling in the feet, hands, or both.  Burning pain.  Very sensitive skin.  Weakness.  Not being able to move a part of the body (paralysis).  Muscle twitching.  Clumsiness or poor coordination.  Loss of balance.  Not being able to control your bladder.  Feeling dizzy.  Sexual problems. How is  this diagnosed? Diagnosing and finding the cause of peripheral neuropathy can be difficult. Your health care provider will take your medical history and do a physical exam. A neurological exam will also be done. This involves checking things that are affected by your brain, spinal cord, and nerves (nervous system). For example, your health care provider will check your reflexes, how you move, and what you can feel. You may have other tests, such as:  Blood tests.  Electromyogram (EMG) and nerve conduction tests. These tests check nerve function and how well the nerves are controlling the muscles.  Imaging tests, such as CT scans or MRI to rule out other causes of your  symptoms.  Removing a small piece of nerve to be examined in a lab (nerve biopsy). This is rare.  Removing and examining a small amount of the fluid that surrounds the brain and spinal cord (lumbar puncture). This is rare. How is this treated? Treatment for this condition may involve:  Treating the underlying cause of the neuropathy, such as diabetes, kidney disease, or vitamin deficiencies.  Stopping medicines that can cause neuropathy, such as chemotherapy.  Medicine to relieve pain. Medicines may include: ? Prescription or over-the-counter pain medicine. ? Antiseizure medicine. ? Antidepressants. ? Pain-relieving patches that are applied to painful areas of skin.  Surgery to relieve pressure on a nerve or to destroy a nerve that is causing pain.  Physical therapy to help improve movement and balance.  Devices to help you move around (assistive devices). Follow these instructions at home: Medicines  Take over-the-counter and prescription medicines only as told by your health care provider. Do not take any other medicines without first asking your health care provider.  Do not drive or use heavy machinery while taking prescription pain medicine. Lifestyle   Do not use any products that contain nicotine or tobacco, such as cigarettes and e-cigarettes. Smoking keeps blood from reaching damaged nerves. If you need help quitting, ask your health care provider.  Avoid or limit alcohol. Too much alcohol can cause a vitamin B deficiency, and vitamin B is needed for healthy nerves.  Eat a healthy diet. This includes: ? Eating foods that are high in fiber, such as fresh fruits and vegetables, whole grains, and beans. ? Limiting foods that are high in fat and processed sugars, such as fried or sweet foods. General instructions   If you have diabetes, work closely with your health care provider to keep your blood sugar under control.  If you have numbness in your feet: ? Check  every day for signs of injury or infection. Watch for redness, warmth, and swelling. ? Wear padded socks and comfortable shoes. These help protect your feet.  Develop a good support system. Living with peripheral neuropathy can be stressful. Consider talking with a mental health specialist or joining a support group.  Use assistive devices and attend physical therapy as told by your health care provider. This may include using a walker or a cane.  Keep all follow-up visits as told by your health care provider. This is important. Contact a health care provider if:  You have new signs or symptoms of peripheral neuropathy.  You are struggling emotionally from dealing with peripheral neuropathy.  Your pain is not well-controlled. Get help right away if:  You have an injury or infection that is not healing normally.  You develop new weakness in an arm or leg.  You fall frequently. Summary  Peripheral neuropathy is when the nerves in the arms,  or legs are damaged, resulting in numbness, weakness, or pain.  There are many causes of peripheral neuropathy, including diabetes, pinched nerves, vitamin deficiencies, autoimmune disease, and hereditary conditions.  Diagnosing and finding the cause of peripheral neuropathy can be difficult. Your health care provider will take your medical history, do a physical exam, and do tests, including blood tests and nerve function tests.  Treatment involves treating the underlying cause of the neuropathy and taking medicines to help control pain. Physical therapy and assistive devices may also help. This information is not intended to replace advice given to you by your health care provider. Make sure you discuss any questions you have with your health care provider. Document Released: 08/28/2002 Document Revised: 08/20/2017 Document Reviewed: 11/16/2016 Elsevier Patient Education  2020 [employer].

## 2019-07-11 ENCOUNTER — Encounter: Payer: Self-pay | Admitting: Plastic Surgery

## 2019-07-11 ENCOUNTER — Other Ambulatory Visit: Payer: Self-pay

## 2019-07-11 ENCOUNTER — Ambulatory Visit (INDEPENDENT_AMBULATORY_CARE_PROVIDER_SITE_OTHER): Payer: Self-pay | Admitting: Plastic Surgery

## 2019-07-11 VITALS — BP 150/89 | HR 68 | Temp 98.2°F | Ht 63.0 in | Wt 172.0 lb

## 2019-07-11 DIAGNOSIS — E119 Type 2 diabetes mellitus without complications: Secondary | ICD-10-CM

## 2019-07-11 DIAGNOSIS — I6340 Cerebral infarction due to embolism of unspecified cerebral artery: Secondary | ICD-10-CM

## 2019-07-11 DIAGNOSIS — Z719 Counseling, unspecified: Secondary | ICD-10-CM

## 2019-07-11 DIAGNOSIS — D352 Benign neoplasm of pituitary gland: Secondary | ICD-10-CM

## 2019-07-11 NOTE — Progress Notes (Addendum)
Patient ID: [name] [name], female    DOB: Jan 20, 1951, 68 y.o.   MRN: [id number]   Chief Complaint  Patient presents with  . Advice Only    Patient is a 68 year old female here for consultation for aesthetic surgery.  She is 5 feet 3 inches tall and weighs 172 pounds over the past several years she has decreased her weight 150 pounds.  She had bariatric surgery in 2012.  Since then she has been great about keeping her weight down.  She has been involved in exercise and healthy eating.  She has had multiple medical conditions and multiple surgeries including hip replacement surgery and spinal surgery.  She is interested in an abdominoplasty, a brachioplasty, a breast lift with possible augmentation and thigh lift.  Her priority is in her arms and she would like to start with that.  She has done an amazing job at losing the fat.  She has pendulous arm soft tissue and skin.  No sign of skin irritations or infections.  She has a history of diabetes and folate deficiency as well as CVAs.  She is on Xarelto for that.  She was told she could come off of it for surgery.  She sees Dr. Zadie Rhine and Dr. Hulda Humphrey.  We would talk with them prior to doing any surgery.  She has severe ptosis of her breasts.  That would be for a later date.   Review of Systems  Constitutional: Negative for activity change and appetite change.  HENT: Negative.   Eyes: Negative.   Respiratory: Negative.  Negative for chest tightness and shortness of breath.   Cardiovascular: Negative for leg swelling.  Gastrointestinal: Negative for abdominal pain.  Endocrine: Negative.   Genitourinary: Negative.   Musculoskeletal: Positive for gait problem and joint swelling.  Skin: Negative for wound.  Hematological: Negative.   Psychiatric/Behavioral: Negative.     Past Medical History:  Diagnosis Date  . Anemia   . Arthritis    "knees, hips, possibly back" (04/08/2016)  . Atrial septal aneurysm   . B12 deficiency    a. following  gastric bypass.  . Back pain   . Chronic lower back pain    spondylosis  . Cold feet   . Daily headache    "short ones for the last month or 2" (04/08/2016)  . Depression   . Diabetes ([place])   . Dry mouth   . DVT (deep venous thrombosis) ([place])   . Easy bruising   . Floaters in visual field   . GERD (gastroesophageal reflux disease)    takes Protonix daily "just to protect my stomach; not for reflux" (04/08/2016)  . Hay fever   . Heart murmur    "dx'd by 1 anesthesiologist years and years ago"  . History of loop recorder 03/2016  . Hyperlipidemia   . Hypertension   . Joint pain   . Leg cramp   . Macular degeneration    "just watching"  . Neuropathy   . Nosebleed   . Palpitations   . Peripheral edema   . Peripheral neuropathy    not on any meds  . Pituitary mass ([place])    a. s/p endoscopic surgery 02/2016.  [name] Pneumonia 12/2017  . Sleep apnea    no CPAP since weight loss after bariatric surgery '12 (04/08/2016)  . Spondylosis   . Stroke ([place]) 04/07/2016   a. 01/2016 and 03/2016. during admission had + LE DVT.  [name] Swelling of extremity   .  TIA (transient ischemic attack)   . Trouble in sleeping   . Urinary incontinence   . Weakness     Past Surgical History:  Procedure Laterality Date  . CATARACT EXTRACTION W/ INTRAOCULAR LENS  IMPLANT, BILATERAL Bilateral ~ 2012  . COLONOSCOPY    . EP IMPLANTABLE DEVICE N/A 04/10/2016   Procedure: Loop Recorder Insertion;  Surgeon: Will Meredith Leeds, MD;  Location: [facility];  Service: Cardiovascular;  Laterality: N/A;  . ESOPHAGOGASTRODUODENOSCOPY     in the 70's  . FACIAL COSMETIC SURGERY    . JOINT REPLACEMENT    . 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 SINUS ENDO W/FUSION  02/28/2016   Procedure: ENDOSCOPIC SINUS SURGERY WITH NAVIGATION;  Surgeon: Ruby Cola, MD;  Location: [facility];  Service: ENT;;  . TEE WITHOUT CARDIOVERSION N/A 04/10/2016   Procedure: TRANSESOPHAGEAL ECHOCARDIOGRAM (TEE) POSSIBLE LOOP;  Surgeon: Sanda Klein, MD;  Location: [facility];  Service: Cardiovascular;  Laterality: N/A;  . TONSILLECTOMY  1957  . TOTAL HIP ARTHROPLASTY Right 2008  . TOTAL HIP REVISION Right 03/30/2016   Procedure: RIGHT TOTAL HIP REVISION;  Surgeon: Frederik Pear, MD;  Location: [facility];  Service: Orthopedics;  Laterality: Right;  . TOTAL KNEE ARTHROPLASTY Bilateral 2001  . VAGINAL HYSTERECTOMY     "left my ovaries"      Current Outpatient Medications:  .  cholecalciferol (VITAMIN D3) 25 MCG (1000 UT) tablet, Take 5,000 Units by mouth daily., Disp: , Rfl:  .  diazepam (VALIUM) 2 MG tablet, TAKE 1 TABLET BY MOUTH TWICE A DAY AS NEEDED FOR VERTIGO, Disp: , Rfl:  .  ferrous sulfate 325 (65 FE) MG tablet, Take 325 mg by mouth 2 (two) times daily with a meal., Disp: , Rfl:  .  gabapentin (NEURONTIN) 300 MG capsule, Take 2 capsules (600 mg total) by mouth at bedtime., Disp: 180 capsule, Rfl: 4 .  hydrochlorothiazide (HYDRODIURIL) 12.5 MG tablet, Take 12.5 mg by mouth daily., Disp: , Rfl:  .  magnesium oxide (MAG-OX) 400 MG tablet, Take 400 mg by mouth at bedtime. , Disp: , Rfl:  .  metFORMIN (GLUCOPHAGE) 500 MG tablet, Take 1 tablet (500 mg total) by mouth daily with breakfast., Disp: 90 tablet, Rfl: 0 .  Multiple Vitamins-Minerals (OCUVITE-LUTEIN PO), Take 1 tablet by mouth daily., Disp: , Rfl:  .  OVER THE COUNTER MEDICATION, P5P vitamin, Disp: , Rfl:  .  rosuvastatin (CRESTOR) 20 MG tablet, Take 1 tablet (20 mg total) by mouth daily. (Patient taking differently: Take 20 mg by mouth at bedtime. ), Disp: 30 tablet, Rfl: 0 .  XARELTO 20 MG TABS tablet, Take 20 mg by mouth daily., Disp: , Rfl:    Objective:   Vitals:   07/11/19 1430  BP: (!) 150/89  Pulse: 68  Temp: 98.2 F (36.8 C)  SpO2: 98%    Physical Exam Vitals signs and nursing note reviewed.  Constitutional:      Appearance: Normal appearance.  HENT:     Head: Normocephalic and atraumatic.  Eyes:     Extraocular Movements: Extraocular movements  intact.  Cardiovascular:     Rate and Rhythm: Normal rate.     Pulses: Normal pulses.  Pulmonary:     Effort: Pulmonary effort is normal.  Abdominal:     General: Abdomen is flat. There is no distension.     Tenderness: There is no abdominal tenderness.  Skin:    General: Skin is warm.  Neurological:     General:  No focal deficit present.     Mental Status: She is alert and oriented to person, place, and time.  Psychiatric:        Mood and Affect: Mood normal.        Behavior: Behavior normal.     Assessment & Plan:  Pituitary adenoma ([place])  Type 2 diabetes mellitus without complication, without long-term current use of insulin ([facility])  Encounter for counseling  Cerebrovascular accident (CVA) due to embolism of cerebral artery ([place]) The patient is a candidate for a brachioplasty.  I stressed to her that the scars are permanent and there is no taking them away once the surgery is done.  She expressed understanding and it was ok with her.  I also stressed to her the need to be cautious in moving forward because of her history.  We talked about the surgery being 4 hours and the possibility of her spending the night.  We will give her a quote and I have asked her to think it over.  Pictures were obtained of the patient and placed in the chart with the patient's or guardian's permission.   [place], DO

## 2019-07-12 ENCOUNTER — Encounter (INDEPENDENT_AMBULATORY_CARE_PROVIDER_SITE_OTHER): Payer: Self-pay | Admitting: Family Medicine

## 2019-07-12 ENCOUNTER — Other Ambulatory Visit: Payer: Self-pay

## 2019-07-12 ENCOUNTER — Ambulatory Visit (INDEPENDENT_AMBULATORY_CARE_PROVIDER_SITE_OTHER): Payer: Medicare Other | Admitting: Family Medicine

## 2019-07-12 VITALS — BP 120/74 | HR 63 | Temp 97.5°F | Ht 63.0 in | Wt 169.0 lb

## 2019-07-12 DIAGNOSIS — E559 Vitamin D deficiency, unspecified: Secondary | ICD-10-CM

## 2019-07-12 DIAGNOSIS — Z6830 Body mass index (BMI) 30.0-30.9, adult: Secondary | ICD-10-CM | POA: Diagnosis not present

## 2019-07-12 DIAGNOSIS — E669 Obesity, unspecified: Secondary | ICD-10-CM | POA: Diagnosis not present

## 2019-07-17 NOTE — Progress Notes (Signed)
Office: [phone]  /  Fax: [phone]   HPI:   Chief Complaint: OBESITY [name] [name] is here to discuss her progress with her obesity treatment plan. She is on the Category 2 plan and is following her eating plan approximately 80 % of the time. She states she is doing cardio exercise and weights 90 minutes 5 times per week. [name] [name] has done well on the plan. She traveled recently and was off plan but has gotten back on track and she is very happy with her weight loss. Her weight goal is 155 pounds. She is having consultations presently with different plastic surgeons to explore having facelift, arm lift, breast lift and abdominoplasty.   Her weight is 169 lb (76.7 kg) today and has had a weight loss of 5 pounds over a period of 2 weeks since her last visit. She has lost 89 lbs since starting treatment with [place].  Vitamin D deficiency [name] [name] has a diagnosis of vitamin D deficiency. Her vit D level is at goal on vitamin D 5,000 IU daily. She denies nausea, vomiting or muscle weakness.  ASSESSMENT AND PLAN:  Vitamin D deficiency  Class 1 obesity with serious comorbidity and body mass index (BMI) of 30.0 to 30.9 in adult, unspecified obesity type  PLAN:  Vitamin D Deficiency [name] [name] was informed that low vitamin D levels contributes to fatigue and are associated with obesity, breast, and colon cancer. [name] [name] will continue to take OTC Vit D _0 ,000 IU daily and she will follow up for routine testing of vitamin D, at least 2-3 times per year. She was informed of the risk of over-replacement of vitamin D and agrees to not increase her dose unless she discusses this with [place] first.  Obesity [name] [name] is currently in the action stage of change. As such, her goal is to continue with weight loss efforts She has agreed to follow the Category 2 plan [name] [name] will continue cardio exercise and weights 90 minutes, 5 times per week for weight loss and overall health benefits. We discussed the following  Behavioral Modification Strategies today: planning for success  [name] [name] has agreed to follow up with our clinic in 2 weeks. She was informed of the importance of frequent follow up visits to maximize her success with intensive lifestyle modifications for her multiple health conditions.  ALLERGIES: Allergies  Allergen Reactions  . Ambien [Zolpidem] Other (See Comments)    NIGHT TERRORS  . Atorvastatin Other (See Comments)    Muscle weakness Muscle Aches  . Codeine Nausea And Vomiting  . Penicillins Itching and Other (See Comments)    Made nose run uncontrollably Has patient had a PCN reaction causing immediate rash, facial/tongue/throat swelling, SOB or lightheadedness with hypotension: No Has patient had a PCN reaction causing severe rash involving mucus membranes or skin necrosis: No Has patient had a PCN reaction that required hospitalization No Has patient had a PCN reaction occurring within the last 10 years: No If all of the above answers are "NO", then may proceed with Cephalosporin use.  . Simvastatin Other (See Comments)    Muscle weakness  . Meclizine     Felt like her heart was pounding, "made me crazy", didn't help the dizziness  . Byetta 10 Mcg Pen [Exenatide] Nausea Only    MEDICATIONS: Current Outpatient Medications on File Prior to Visit  Medication Sig Dispense Refill  . cholecalciferol (VITAMIN D3) 25 MCG (1000 UT) tablet Take 5,000 Units by mouth daily.    . diazepam (VALIUM) 2 MG tablet TAKE  1 TABLET BY MOUTH TWICE A DAY AS NEEDED FOR VERTIGO    . ferrous sulfate 325 (65 FE) MG tablet Take 325 mg by mouth 2 (two) times daily with a meal.    . gabapentin (NEURONTIN) 300 MG capsule Take 2 capsules (600 mg total) by mouth at bedtime. 180 capsule 4  . hydrochlorothiazide (HYDRODIURIL) 12.5 MG tablet Take 12.5 mg by mouth daily.    . magnesium oxide (MAG-OX) 400 MG tablet Take 400 mg by mouth at bedtime.     . metFORMIN (GLUCOPHAGE) 500 MG tablet Take 1 tablet (500 mg  total) by mouth daily with breakfast. 90 tablet 0  . Multiple Vitamins-Minerals (OCUVITE-LUTEIN PO) Take 1 tablet by mouth daily.    [name] OVER THE COUNTER MEDICATION P5P vitamin    . rosuvastatin (CRESTOR) 20 MG tablet Take 1 tablet (20 mg total) by mouth daily. (Patient taking differently: Take 20 mg by mouth at bedtime. ) 30 tablet 0  . XARELTO 20 MG TABS tablet Take 20 mg by mouth daily.     No current facility-administered medications on file prior to visit.     PAST MEDICAL HISTORY: Past Medical History:  Diagnosis Date  . Anemia   . Arthritis    "knees, hips, possibly back" (04/08/2016)  . Atrial septal aneurysm   . B12 deficiency    a. following gastric bypass.  . Back pain   . Chronic lower back pain    spondylosis  . Cold feet   . Daily headache    "short ones for the last month or 2" (04/08/2016)  . Depression   . Diabetes ([place])   . Dry mouth   . DVT (deep venous thrombosis) ([place])   . Easy bruising   . Floaters in visual field   . GERD (gastroesophageal reflux disease)    takes Protonix daily "just to protect my stomach; not for reflux" (04/08/2016)  . Hay fever   . Heart murmur    "dx'd by 1 anesthesiologist years and years ago"  . History of loop recorder 03/2016  . Hyperlipidemia   . Hypertension   . Joint pain   . Leg cramp   . Macular degeneration    "just watching"  . Neuropathy   . Nosebleed   . Palpitations   . Peripheral edema   . Peripheral neuropathy    not on any meds  . 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  [name] Pneumonia 12/2017  . Sleep apnea    no CPAP since weight loss after bariatric surgery '12 (04/08/2016)  . Spondylosis   . Stroke ([place]) 04/07/2016   a. 01/2016 and 03/2016. during admission had + LE DVT.  [name] Swelling of extremity   . TIA (transient ischemic attack)   . Trouble in sleeping   . Urinary incontinence   . Weakness     PAST SURGICAL HISTORY: Past Surgical History:  Procedure Laterality Date  . CATARACT  EXTRACTION W/ INTRAOCULAR LENS  IMPLANT, BILATERAL Bilateral ~ 2012  . COLONOSCOPY    . EP IMPLANTABLE DEVICE N/A 04/10/2016   Procedure: Loop Recorder Insertion;  Surgeon: Will Meredith Leeds, MD;  Location: [facility];  Service: Cardiovascular;  Laterality: N/A;  . ESOPHAGOGASTRODUODENOSCOPY     in the 70's  . FACIAL COSMETIC SURGERY    . JOINT REPLACEMENT    . 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 SINUS ENDO W/FUSION  02/28/2016   Procedure: ENDOSCOPIC SINUS SURGERY WITH NAVIGATION;  Surgeon: Ruby Cola, MD;  Location: [facility];  Service: ENT;;  . TEE WITHOUT CARDIOVERSION N/A 04/10/2016   Procedure: TRANSESOPHAGEAL ECHOCARDIOGRAM (TEE) POSSIBLE LOOP;  Surgeon: Sanda Klein, MD;  Location: [facility];  Service: Cardiovascular;  Laterality: N/A;  . TONSILLECTOMY  1957  . TOTAL HIP ARTHROPLASTY Right 2008  . TOTAL HIP REVISION Right 03/30/2016   Procedure: RIGHT TOTAL HIP REVISION;  Surgeon: Frederik Pear, MD;  Location: [facility];  Service: Orthopedics;  Laterality: Right;  . TOTAL KNEE ARTHROPLASTY Bilateral 2001  . VAGINAL HYSTERECTOMY     "left my ovaries"    SOCIAL HISTORY: Social History   Tobacco Use  . Smoking status: Former Smoker    Packs/day: 1.00    Years: 40.00    Pack years: 40.00    Types: Cigarettes    Quit date: 08/2006    Years since quitting: 12.9  . Smokeless tobacco: Never Used  Substance Use Topics  . Alcohol use: Yes    Alcohol/week: 2.0 standard drinks    Types: 2 Glasses of wine per week    Comment: 07/06/2019 maybe 2 glasses of wine/week; 04/08/2016 "5-6 drinks/year'  . Drug use: No    Types: Marijuana    Comment: 04/08/2016 "did some marijuana in college"    FAMILY HISTORY: Family History  Problem Relation Age of Onset  . Neuropathy Mother   . Hypertension Mother   . Hyperlipidemia Mother   . Thyroid disease Mother   . Prostate cancer Father   . Lymphoma Father   . Hypertension Father   . Hyperlipidemia Father   . Cancer Father   .  Obesity Father   . Congestive Heart Failure Maternal Grandmother   . Lung cancer Maternal Grandfather     ROS: Review of Systems  Constitutional: Positive for weight loss.  Gastrointestinal: Negative for nausea and vomiting.  Musculoskeletal:       Negative for muscle weakness    PHYSICAL EXAM: Blood pressure 120/74, pulse 63, temperature (!) 97.5 F (36.4 C), temperature source Oral, height _0  (1.6 m), weight 169 lb (76.7 kg), SpO2 98 %. Body mass index is 29.94 kg/m. Physical Exam Vitals signs reviewed.  Constitutional:      Appearance: Normal appearance. She is well-developed. She is obese.  Cardiovascular:     Rate and Rhythm: Normal rate.  Pulmonary:     Effort: Pulmonary effort is normal.  Musculoskeletal: Normal range of motion.  Skin:    General: Skin is warm and dry.  Neurological:     Mental Status: She is alert and oriented to person, place, and time.  Psychiatric:        Mood and Affect: Mood normal.        Behavior: Behavior normal.     RECENT LABS AND TESTS: BMET    Component Value Date/Time   NA 139 06/04/2019 1050   NA 139 06/01/2019 1015   NA 139 04/29/2017 0944   K 3.9 06/04/2019 1050   K 3.9 04/29/2017 0944   CL 105 06/04/2019 1050   CO2 26 06/04/2019 1050   CO2 29 04/29/2017 0944   GLUCOSE 132 (H) 06/04/2019 1050   GLUCOSE 120 04/29/2017 0944   BUN 13 06/04/2019 1050   BUN 23 06/01/2019 1015   BUN 15.8 04/29/2017 0944   CREATININE 0.75 06/04/2019 1050   CREATININE 0.8 04/29/2017 0944   CALCIUM 9.0 06/04/2019 1050   CALCIUM 9.2 04/29/2017 0944   GFRNONAA >60 06/04/2019 1050   GFRAA >60 06/04/2019 1050  Lab Results  Component Value Date   HGBA1C 4.8 05/04/2019   HGBA1C 5.1 11/03/2018   HGBA1C 5.4 08/02/2018   HGBA1C 6.9 (H) 04/27/2018   HGBA1C 6.0 (H) 04/09/2016   Lab Results  Component Value Date   INSULIN 6.9 08/02/2018   INSULIN 7.0 04/27/2018   CBC    Component Value Date/Time   WBC 8.0 06/04/2019 1050   RBC 4.77  06/04/2019 1050   HGB 13.6 06/04/2019 1050   HGB 13.5 05/04/2019 1025   HGB 11.9 04/29/2017 0944   HCT 42.2 06/04/2019 1050   HCT 43.1 05/04/2019 1025   HCT 36.9 04/29/2017 0944   PLT 244 06/04/2019 1050   PLT 275 04/27/2018 0000   MCV 88.5 06/04/2019 1050   MCV 87 05/04/2019 1025   MCV 81.5 04/29/2017 0944   MCH 28.5 06/04/2019 1050   MCHC 32.2 06/04/2019 1050   RDW 15.4 06/04/2019 1050   RDW 18.0 (H) 05/04/2019 1025   RDW 14.2 04/29/2017 0944   LYMPHSABS 1.1 05/04/2019 1025   LYMPHSABS 1.7 04/29/2017 0944   MONOABS 0.6 04/29/2017 0944   EOSABS 0.1 05/04/2019 1025   BASOSABS 0.1 05/04/2019 1025   BASOSABS 0.0 04/29/2017 0944   Iron/TIBC/Ferritin/ %Sat    Component Value Date/Time   IRON 28 11/03/2018 1545   IRON 51 04/29/2017 0944   TIBC 335 11/03/2018 1545   TIBC 343 04/29/2017 0944   FERRITIN 34 11/03/2018 1545   FERRITIN 23 04/29/2017 0944   IRONPCTSAT 8 (LL) 11/03/2018 1545   IRONPCTSAT 15 (L) 04/29/2017 0944   IRONPCTSAT 12 (L) 05/29/2011 1436   Lipid Panel     Component Value Date/Time   CHOL 152 11/03/2018 1545   TRIG 109 11/03/2018 1545   HDL 61 11/03/2018 1545   CHOLHDL 2.7 04/09/2016 0155   VLDL 27 04/09/2016 0155   LDLCALC 69 11/03/2018 1545   Hepatic Function Panel     Component Value Date/Time   PROT 6.5 06/01/2019 1015   PROT 6.8 04/29/2017 0944   ALBUMIN 4.4 06/01/2019 1015   ALBUMIN 3.4 (L) 04/29/2017 0944   AST 30 06/01/2019 1015   AST 17 04/29/2017 0944   ALT 19 06/01/2019 1015   ALT 14 04/29/2017 0944   ALKPHOS 66 06/01/2019 1015   ALKPHOS 116 04/29/2017 0944   BILITOT 0.4 06/01/2019 1015   BILITOT 0.41 04/29/2017 0944   Lab Results  Component Value Date   TSH 2.100 05/04/2019      Ref. Range 05/04/2019 10:25  Vitamin D, 25-Hydroxy Latest Ref Range: 30.0 - 100.0 ng/mL 50.7    OBESITY BEHAVIORAL INTERVENTION VISIT  Today's visit was # 29   Starting weight: 258 lbs Starting date: 04/27/2018 Today's weight : 169 lbs   Today's date: 07/12/2019 Total lbs lost to date: 89 At least 15 minutes were spent on discussing the following behavioral intervention visit.     07/12/2019  Height _0  (1.6 m)  Weight 169 lb (76.7 kg)  BMI (Calculated) 29.94  BLOOD PRESSURE - SYSTOLIC 161  BLOOD PRESSURE - DIASTOLIC 74   Body Fat % 09.6 %    ASK: We discussed the diagnosis of obesity with [name] [name] today and [name] [name] agreed to give [place] permission to discuss obesity behavioral modification therapy today.  ASSESS: [name] [name] has the diagnosis of obesity and her BMI today is 29.94 [name] [name] is in the action stage of change   ADVISE: [name] [name] was educated on the multiple health risks of obesity as well as the benefit of  weight loss to improve her health. She was advised of the need for long term treatment and the importance of lifestyle modifications to improve her current health and to decrease her risk of future health problems.  AGREE: Multiple dietary modification options and treatment options were discussed and  [name] [name] agreed to follow the recommendations documented in the above note.  ARRANGE: [name] [name] was educated on the importance of frequent visits to treat obesity as outlined per CMS and USPSTF guidelines and agreed to schedule her next follow up appointment today.  Corey Skains, am acting as Location manager for [name], FNP-C.  I have reviewed the above documentation for accuracy and completeness, and I agree with the above.  - Shamonica Schadt, FNP-C.

## 2019-07-18 ENCOUNTER — Encounter (INDEPENDENT_AMBULATORY_CARE_PROVIDER_SITE_OTHER): Payer: Self-pay | Admitting: Family Medicine

## 2019-07-20 ENCOUNTER — Encounter: Payer: Self-pay | Admitting: *Deleted

## 2019-07-20 ENCOUNTER — Telehealth: Payer: Self-pay | Admitting: *Deleted

## 2019-07-20 NOTE — Telephone Encounter (Signed)
Clearance letter signed by Dr. Jaynee Eagles and faxed to [facility]. Received a receipt of confirmation.

## 2019-07-20 NOTE — Telephone Encounter (Signed)
Received request from Dr. Percell Miller for permission for pt to stop Xarelto for her surgery planned in November 2020. Ok per Dr. Jaynee Eagles for pt to stop Xarelto per Dr. Debroah Loop recommendations.  Letter pending MD signature and fax.

## 2019-07-23 LAB — CUP PACEART REMOTE DEVICE CHECK
Date Time Interrogation Session: 20201101182447
Implantable Pulse Generator Implant Date: 20170721

## 2019-07-24 ENCOUNTER — Ambulatory Visit (INDEPENDENT_AMBULATORY_CARE_PROVIDER_SITE_OTHER): Payer: Medicare Other | Admitting: *Deleted

## 2019-07-24 DIAGNOSIS — I639 Cerebral infarction, unspecified: Secondary | ICD-10-CM

## 2019-07-31 ENCOUNTER — Encounter: Payer: Self-pay | Admitting: Podiatry

## 2019-07-31 ENCOUNTER — Other Ambulatory Visit: Payer: Self-pay

## 2019-07-31 ENCOUNTER — Ambulatory Visit (INDEPENDENT_AMBULATORY_CARE_PROVIDER_SITE_OTHER): Payer: Medicare Other | Admitting: Podiatry

## 2019-07-31 DIAGNOSIS — E1140 Type 2 diabetes mellitus with diabetic neuropathy, unspecified: Secondary | ICD-10-CM

## 2019-07-31 DIAGNOSIS — M21629 Bunionette of unspecified foot: Secondary | ICD-10-CM

## 2019-07-31 DIAGNOSIS — I639 Cerebral infarction, unspecified: Secondary | ICD-10-CM | POA: Diagnosis not present

## 2019-07-31 DIAGNOSIS — Q828 Other specified congenital malformations of skin: Secondary | ICD-10-CM

## 2019-07-31 DIAGNOSIS — D689 Coagulation defect, unspecified: Secondary | ICD-10-CM

## 2019-08-02 NOTE — Progress Notes (Signed)
Subjective:   Patient ID: [name] [name], female   DOB: 68 y.o.   MRN: [id number]   HPI Patient presents stating she has been developing some issues with neuropathic changes and she does have a blood clotting issue with chronic lesion formation fifth metatarsal bilateral and also states she does not have enough foot support   ROS      Objective:  Physical Exam  Neurovascular status unchanged with diminishment of sharp dull vibratory with patient on blood thinner currently with lesion formation bilateral that becomes painful and moderate depression of the arch with inflammation of the arch area bilateral     Assessment:  Inflammatory condition with keratotic lesions also noted that are moderately painful     Plan:  H&P reviewed conditions and at this time debrided lesions with no iatrogenic bleeding and dispensed an over-the-counter power step insole to try to provide for better support take some of the pressure off this area with the understanding ultimately this may require surgery or more aggressive treatment plan

## 2019-08-03 ENCOUNTER — Encounter (INDEPENDENT_AMBULATORY_CARE_PROVIDER_SITE_OTHER): Payer: Self-pay | Admitting: Family Medicine

## 2019-08-03 ENCOUNTER — Other Ambulatory Visit: Payer: Self-pay

## 2019-08-03 ENCOUNTER — Ambulatory Visit (INDEPENDENT_AMBULATORY_CARE_PROVIDER_SITE_OTHER): Payer: Medicare Other | Admitting: Family Medicine

## 2019-08-03 VITALS — BP 128/81 | HR 69 | Temp 98.2°F | Ht 63.0 in | Wt 166.0 lb

## 2019-08-03 DIAGNOSIS — Z9189 Other specified personal risk factors, not elsewhere classified: Secondary | ICD-10-CM | POA: Diagnosis not present

## 2019-08-03 DIAGNOSIS — E119 Type 2 diabetes mellitus without complications: Secondary | ICD-10-CM

## 2019-08-03 DIAGNOSIS — I10 Essential (primary) hypertension: Secondary | ICD-10-CM

## 2019-08-03 DIAGNOSIS — E669 Obesity, unspecified: Secondary | ICD-10-CM | POA: Diagnosis not present

## 2019-08-03 DIAGNOSIS — Z6830 Body mass index (BMI) 30.0-30.9, adult: Secondary | ICD-10-CM

## 2019-08-03 MED ORDER — METFORMIN HCL 500 MG PO TABS: 500.0000 mg | ORAL_TABLET | Freq: Every day | ORAL | 0 refills | Status: DC

## 2019-08-03 MED ORDER — HYDROCHLOROTHIAZIDE 12.5 MG PO TABS: 12.5000 mg | ORAL_TABLET | Freq: Every day | ORAL | 0 refills | Status: DC

## 2019-08-07 NOTE — Progress Notes (Signed)
Office: [phone]  /  Fax: [phone]   HPI:   Chief Complaint: OBESITY [name] [name] is here to discuss her progress with her obesity treatment plan. She is on the Category 2 plan and is following her eating plan approximately 80 % of the time. She states she is doing cardio exercising and exercising at the gym 90 minutes 5 times per week. [name] [name] is tired of worrying about sticking to the plan, but she realizes she needs to do this to continue to lose weight. She has done very well with weight loss. 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 Excell Seltzer.. She lost down to 213 lbs but then regained and was up to 258 lbs at her first visit at our clinic on 04/27/18. As a result, she has quite a bit of loose skin.  She will have bilateral brachioplasty on 08/14/19 and is also planning to have a face lift, mastopexy, abdominoplasty, and thigh lift in the near future. Her weight is 166 lb (75.3 kg) today and has had a weight loss of 3 pounds over a period of 3 weeks since her last visit. She has lost 92 lbs since starting treatment with [place].  Hypertension [name] [name] is a 68 y.o. female with hypertension.  [name] [name]'s blood pressure is well controlled on 12.5 mg of HCTZ. She denies chest pain or shortness of breath on exertion. She is working weight loss to help control her blood pressure with the goal of decreasing her risk of heart attack and stroke. BP Readings from Last 3 Encounters:  08/03/19 128/81  07/12/19 120/74  07/11/19 (!) 150/89    Diabetes II [name] [name] has a diagnosis of diabetes type II which is essentially in remission. She is on metformin daily in the morning. [name] [name] does not check her blood sugars. Last A1c was at 4.8. We have discussed discontinuing the metformin but decided together to continue it since she tolerates it well and is on a low dose.   At risk for cardiovascular disease [name] [name] is at a higher than average risk for cardiovascular disease due to obesity,  hypertension and diabetes. She currently denies any chest pain.  ASSESSMENT AND PLAN:  Essential hypertension - Plan: hydrochlorothiazide (HYDRODIURIL) 12.5 MG tablet  Type 2 diabetes mellitus without complication, without long-term current use of insulin ([name] [name]) - Plan: metFORMIN (GLUCOPHAGE) 500 MG tablet  At risk for heart disease  Class 1 obesity with serious comorbidity and body mass index (BMI) of 30.0 to 30.9 in adult, unspecified obesity type - BMI greater than 30 at start of program   PLAN:  Hypertension We discussed sodium restriction, working on healthy weight loss, and a regular exercise program as the means to achieve improved blood pressure control. [name] [name] agreed with this plan and agreed to follow up as directed. We will continue to monitor her blood pressure as well as her progress with the above lifestyle modifications. She agrees to continue HCTZ 12.5 mg daily #30 with no refills and she will watch for signs of hypotension as she continues her lifestyle modifications.  Diabetes II  [name] [name] agrees to continue metformin 500 mg qAM #30 with no refills and follow up at the agreed upon time.  Cardiovascular risk counseling [name] [name] was given extended (15 minutes) coronary artery disease prevention counseling today. She is 68 y.o. female and has risk factors for heart disease including obesity, hypertension and diabetes. We discussed intensive lifestyle modifications today with an emphasis on specific weight loss instructions and strategies.  Pt was also informed of the importance of increasing exercise and decreasing saturated fats to help prevent heart disease.  Obesity [name] [name] is currently in the action stage of change. As such, her goal is to continue with weight loss efforts She has agreed to follow the Category 2 plan [name] [name] will continue current exercise regimen for weight loss and overall health benefits. We discussed the following Behavioral Modification Strategies today:  planning for success  [name] [name] has agreed to follow up with our clinic in 3 weeks. She was informed of the importance of frequent follow up visits to maximize her success with intensive lifestyle modifications for her multiple health conditions.  ALLERGIES: Allergies  Allergen Reactions  . Ambien [Zolpidem] Other (See Comments)    NIGHT TERRORS  . Atorvastatin Other (See Comments)    Muscle weakness Muscle Aches  . Codeine Nausea And Vomiting  . Penicillins Itching and Other (See Comments)    Made nose run uncontrollably Has patient had a PCN reaction causing immediate rash, facial/tongue/throat swelling, SOB or lightheadedness with hypotension: No Has patient had a PCN reaction causing severe rash involving mucus membranes or skin necrosis: No Has patient had a PCN reaction that required hospitalization No Has patient had a PCN reaction occurring within the last 10 years: No If all of the above answers are "NO", then may proceed with Cephalosporin use.  . Simvastatin Other (See Comments)    Muscle weakness  . Meclizine     Felt like her heart was pounding, "made me crazy", didn't help the dizziness  . Byetta 10 Mcg Pen [Exenatide] Nausea Only    MEDICATIONS: Current Outpatient Medications on File Prior to Visit  Medication Sig Dispense Refill  . cholecalciferol (VITAMIN D3) 25 MCG (1000 UT) tablet Take 5,000 Units by mouth daily.    . diazepam (VALIUM) 2 MG tablet TAKE 1 TABLET BY MOUTH TWICE A DAY AS NEEDED FOR VERTIGO    . ferrous sulfate 325 (65 FE) MG tablet Take 325 mg by mouth 2 (two) times daily with a meal.    . gabapentin (NEURONTIN) 300 MG capsule Take 2 capsules (600 mg total) by mouth at bedtime. 180 capsule 4  . magnesium oxide (MAG-OX) 400 MG tablet Take 400 mg by mouth at bedtime.     . Multiple Vitamins-Minerals (OCUVITE-LUTEIN PO) Take 1 tablet by mouth daily.    [name] OVER THE COUNTER MEDICATION P5P vitamin    . rosuvastatin (CRESTOR) 20 MG tablet Take 1 tablet (20  mg total) by mouth daily. (Patient taking differently: Take 20 mg by mouth at bedtime. ) 30 tablet 0  . XARELTO 20 MG TABS tablet Take 20 mg by mouth daily.     No current facility-administered medications on file prior to visit.     PAST MEDICAL HISTORY: Past Medical History:  Diagnosis Date  . Anemia   . Arthritis    "knees, hips, possibly back" (04/08/2016)  . Atrial septal aneurysm   . B12 deficiency    a. following gastric bypass.  . Back pain   . Chronic lower back pain    spondylosis  . Cold feet   . Daily headache    "short ones for the last month or 2" (04/08/2016)  . Depression   . Diabetes ([place])   . Dry mouth   . DVT (deep venous thrombosis) ([place])   . Easy bruising   . Floaters in visual field   . GERD (gastroesophageal reflux disease)    takes Protonix daily "just  to protect my stomach; not for reflux" (04/08/2016)  . Hay fever   . Heart murmur    "dx'd by 1 anesthesiologist years and years ago"  . History of loop recorder 03/2016  . Hyperlipidemia   . Hypertension   . Joint pain   . Leg cramp   . Macular degeneration    "just watching"  . Neuropathy   . Nosebleed   . Palpitations   . Peripheral edema   . Peripheral neuropathy    not on any meds  . 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  [name] Pneumonia 12/2017  . Sleep apnea    no CPAP since weight loss after bariatric surgery '12 (04/08/2016)  . Spondylosis   . Stroke ([place]) 04/07/2016   a. 01/2016 and 03/2016. during admission had + LE DVT.  [name] Swelling of extremity   . TIA (transient ischemic attack)   . Trouble in sleeping   . Urinary incontinence   . Weakness     PAST SURGICAL HISTORY: Past Surgical History:  Procedure Laterality Date  . CATARACT EXTRACTION W/ INTRAOCULAR LENS  IMPLANT, BILATERAL Bilateral ~ 2012  . COLONOSCOPY    . EP IMPLANTABLE DEVICE N/A 04/10/2016   Procedure: Loop Recorder Insertion;  Surgeon: Will Meredith Leeds, MD;  Location: [facility];   Service: Cardiovascular;  Laterality: N/A;  . ESOPHAGOGASTRODUODENOSCOPY     in the 70's  . FACIAL COSMETIC SURGERY    . JOINT REPLACEMENT    . 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 SINUS ENDO W/FUSION  02/28/2016   Procedure: ENDOSCOPIC SINUS SURGERY WITH NAVIGATION;  Surgeon: Ruby Cola, MD;  Location: [place];  Service: ENT;;  . TEE WITHOUT CARDIOVERSION N/A 04/10/2016   Procedure: TRANSESOPHAGEAL ECHOCARDIOGRAM (TEE) POSSIBLE LOOP;  Surgeon: Sanda Klein, MD;  Location: [facility];  Service: Cardiovascular;  Laterality: N/A;  . TONSILLECTOMY  1957  . TOTAL HIP ARTHROPLASTY Right 2008  . TOTAL HIP REVISION Right 03/30/2016   Procedure: RIGHT TOTAL HIP REVISION;  Surgeon: Frederik Pear, MD;  Location: [facility];  Service: Orthopedics;  Laterality: Right;  . TOTAL KNEE ARTHROPLASTY Bilateral 2001  . VAGINAL HYSTERECTOMY     "left my ovaries"    SOCIAL HISTORY: Social History   Tobacco Use  . Smoking status: Former Smoker    Packs/day: 1.00    Years: 40.00    Pack years: 40.00    Types: Cigarettes    Quit date: 08/2006    Years since quitting: 12.9  . Smokeless tobacco: Never Used  Substance Use Topics  . Alcohol use: Yes    Alcohol/week: 2.0 standard drinks    Types: 2 Glasses of wine per week    Comment: 07/06/2019 maybe 2 glasses of wine/week; 04/08/2016 "5-6 drinks/year'  . Drug use: No    Types: Marijuana    Comment: 04/08/2016 "did some marijuana in college"    FAMILY HISTORY: Family History  Problem Relation Age of Onset  . Neuropathy Mother   . Hypertension Mother   . Hyperlipidemia Mother   . Thyroid disease Mother   . Prostate cancer Father   . Lymphoma Father   . Hypertension Father   . Hyperlipidemia Father   . Cancer Father   . Obesity Father   . Congestive Heart Failure Maternal Grandmother   . Lung cancer Maternal Grandfather     ROS: Review of Systems  Constitutional: Positive for weight loss.  Respiratory: Negative for shortness of breath (on  exertion).   Cardiovascular: Negative for chest pain.    PHYSICAL EXAM: Blood pressure 128/81, pulse 69, temperature 98.2 F (36.8 C), temperature source Oral, height _0  (1.6 m), weight 166 lb (75.3 kg), SpO2 100 %. Body mass index is 29.41 kg/m. Physical Exam  RECENT LABS AND TESTS: BMET    Component Value Date/Time   NA 139 06/04/2019 1050   NA 139 06/01/2019 1015   NA 139 04/29/2017 0944   K 3.9 06/04/2019 1050   K 3.9 04/29/2017 0944   CL 105 06/04/2019 1050   CO2 26 06/04/2019 1050   CO2 29 04/29/2017 0944   GLUCOSE 132 (H) 06/04/2019 1050   GLUCOSE 120 04/29/2017 0944   BUN 13 06/04/2019 1050   BUN 23 06/01/2019 1015   BUN 15.8 04/29/2017 0944   CREATININE 0.75 06/04/2019 1050   CREATININE 0.8 04/29/2017 0944   CALCIUM 9.0 06/04/2019 1050   CALCIUM 9.2 04/29/2017 0944   GFRNONAA >60 06/04/2019 1050   GFRAA >60 06/04/2019 1050   Lab Results  Component Value Date   HGBA1C 4.8 05/04/2019   HGBA1C 5.1 11/03/2018   HGBA1C 5.4 08/02/2018   HGBA1C 6.9 (H) 04/27/2018   HGBA1C 6.0 (H) 04/09/2016   Lab Results  Component Value Date   INSULIN 6.9 08/02/2018   INSULIN 7.0 04/27/2018   CBC    Component Value Date/Time   WBC 8.0 06/04/2019 1050   RBC 4.77 06/04/2019 1050   HGB 13.6 06/04/2019 1050   HGB 13.5 05/04/2019 1025   HGB 11.9 04/29/2017 0944   HCT 42.2 06/04/2019 1050   HCT 43.1 05/04/2019 1025   HCT 36.9 04/29/2017 0944   PLT 244 06/04/2019 1050   PLT 275 04/27/2018 0000   MCV 88.5 06/04/2019 1050   MCV 87 05/04/2019 1025   MCV 81.5 04/29/2017 0944   MCH 28.5 06/04/2019 1050   MCHC 32.2 06/04/2019 1050   RDW 15.4 06/04/2019 1050   RDW 18.0 (H) 05/04/2019 1025   RDW 14.2 04/29/2017 0944   LYMPHSABS 1.1 05/04/2019 1025   LYMPHSABS 1.7 04/29/2017 0944   MONOABS 0.6 04/29/2017 0944   EOSABS 0.1 05/04/2019 1025   BASOSABS 0.1 05/04/2019 1025   BASOSABS 0.0 04/29/2017 0944   Iron/TIBC/Ferritin/ %Sat    Component Value Date/Time   IRON 28  11/03/2018 1545   IRON 51 04/29/2017 0944   TIBC 335 11/03/2018 1545   TIBC 343 04/29/2017 0944   FERRITIN 34 11/03/2018 1545   FERRITIN 23 04/29/2017 0944   IRONPCTSAT 8 (LL) 11/03/2018 1545   IRONPCTSAT 15 (L) 04/29/2017 0944   IRONPCTSAT 12 (L) 05/29/2011 1436   Lipid Panel     Component Value Date/Time   CHOL 152 11/03/2018 1545   TRIG 109 11/03/2018 1545   HDL 61 11/03/2018 1545   CHOLHDL 2.7 04/09/2016 0155   VLDL 27 04/09/2016 0155   LDLCALC 69 11/03/2018 1545   Hepatic Function Panel     Component Value Date/Time   PROT 6.5 06/01/2019 1015   PROT 6.8 04/29/2017 0944   ALBUMIN 4.4 06/01/2019 1015   ALBUMIN 3.4 (L) 04/29/2017 0944   AST 30 06/01/2019 1015   AST 17 04/29/2017 0944   ALT 19 06/01/2019 1015   ALT 14 04/29/2017 0944   ALKPHOS 66 06/01/2019 1015   ALKPHOS 116 04/29/2017 0944   BILITOT 0.4 06/01/2019 1015   BILITOT 0.41 04/29/2017 0944   Lab Results  Component Value Date   TSH 2.100 05/04/2019      Ref.  Range 05/04/2019 10:25  Vitamin D, 25-Hydroxy Latest Ref Range: 30.0 - 100.0 ng/mL 50.7    OBESITY BEHAVIORAL INTERVENTION VISIT  Today's visit was # 30   Starting weight: 258 lbs Starting date: 04/27/2018 Today's weight : 166 lbs Today's date: 08/03/2019 Total lbs lost to date: 92 lbs  At least 15 minutes were spent on discussing the following behavioral intervention visit.     08/03/2019  Height 5' 3" (1.6 m)  Weight 166 lb (75.3 kg)  BMI (Calculated) 29.41  BLOOD PRESSURE - SYSTOLIC 521  BLOOD PRESSURE - DIASTOLIC 81   Body Fat % 74.7 %  Total Body Water (lbs) 75 lbs    ASK: We discussed the diagnosis of obesity with [name] [name] today and [name] [name] agreed to give [place] permission to discuss obesity behavioral modification therapy today.  ASSESS: [name] [name] has the diagnosis of obesity and her BMI today is 29.41 [name] [name] is in the action stage of change   ADVISE: Criss was educated on the multiple health risks of obesity as well as  the benefit of weight loss to improve her health. She was advised of the need for long term treatment and the importance of lifestyle modifications to improve her current health and to decrease her risk of future health problems.  AGREE: Multiple dietary modification options and treatment options were discussed and  [name] [name] agreed to follow the recommendations documented in the above note.  ARRANGE: [name] [name] was educated on the importance of frequent visits to treat obesity as outlined per CMS and USPSTF guidelines and agreed to schedule her next follow up appointment today.  Corey Skains, am acting as Location manager for [name], FNP-C.  I have reviewed the above documentation for accuracy and completeness, and I agree with the above.  - Ingeborg Fite, FNP-C.

## 2019-08-16 NOTE — Progress Notes (Signed)
[facility] Summary Report / Loop Recorder

## 2019-08-23 ENCOUNTER — Other Ambulatory Visit: Payer: Self-pay

## 2019-08-23 ENCOUNTER — Ambulatory Visit (INDEPENDENT_AMBULATORY_CARE_PROVIDER_SITE_OTHER): Payer: Medicare Other | Admitting: Family Medicine

## 2019-08-23 ENCOUNTER — Encounter (INDEPENDENT_AMBULATORY_CARE_PROVIDER_SITE_OTHER): Payer: Self-pay | Admitting: Family Medicine

## 2019-08-23 VITALS — HR 70 | Temp 97.6°F | Ht 63.0 in | Wt 166.0 lb

## 2019-08-23 DIAGNOSIS — Z6830 Body mass index (BMI) 30.0-30.9, adult: Secondary | ICD-10-CM

## 2019-08-23 DIAGNOSIS — F3289 Other specified depressive episodes: Secondary | ICD-10-CM

## 2019-08-23 DIAGNOSIS — E669 Obesity, unspecified: Secondary | ICD-10-CM

## 2019-08-23 DIAGNOSIS — E559 Vitamin D deficiency, unspecified: Secondary | ICD-10-CM

## 2019-08-23 NOTE — Progress Notes (Signed)
Office: [phone]  /  Fax: [phone]   HPI:   Chief Complaint: OBESITY [name] [name] is here to discuss her progress with her obesity treatment plan. She is on the Category 2 plan and is following her eating plan approximately 50 % of the time. She states she is exercising 0 minutes 0 times per week. [name] [name] had brachioplasty on 08/14/19. She is doing well, aside from numbness and swelling of her left forearm. She has been eating to many carb's since her surgery. She is tired of sticking to the plan even though she knows she must and she feels she is self sabotaging. Her weight is 166 lb (75.3 kg) today and she has maintained weight since her last visit. She has lost 92 lbs since starting treatment with [place].  Vitamin D deficiency [name] [name] has a diagnosis of vitamin D deficiency. Her vitamin D level is at goal. She is maintained on 5,000 IU of vitamin D daily. She denies nausea, vomiting or muscle weakness.  Depression with emotional eating behaviors [name] [name] is struggling with eating related to cravings and self sabotage. We discussed Bupropion and she declined.  She shows no sign of suicidal or homicidal ideations.  ASSESSMENT AND PLAN:  Vitamin D deficiency  Other depression, emotional eating  Class 1 obesity with serious comorbidity and body mass index (BMI) of 30.0 to 30.9 in adult, unspecified obesity type - Starting BMI greater then 30  PLAN:  Vitamin D Deficiency [name] [name] was informed that low vitamin D levels contributes to fatigue and are associated with obesity, breast, and colon cancer. [name] [name] will continue to take OTC Vit D _0 ,000 IU daily and she will follow up for routine testing of vitamin D, at least 2-3 times per year. She was informed of the risk of over-replacement of vitamin D and agrees to not increase her dose unless she discusses this with [place] first.  Emotional Eating Behaviors (other depression) We discussed behavior modification techniques today to help [name] [name] deal  with her emotional eating behaviors. We discussed her seeing [name] [name] and she will take this into consideration. [name] [name] will follow up as directed.  Obesity [name] [name] is currently in the action stage of change. As such, her goal is to continue with weight loss efforts She has agreed to follow the Category 2 plan [name] [name] is unable to exercise due to recent surgery. We discussed the following Behavioral Modification Strategies today: planning for success, keeping healthy foods in the home and decreasing simple carbohydrates  We discussed getting rid of junk food in the home. Recipe for mini pumpkin pie was given to patient today.  [name] [name] has agreed to follow up with our clinic in 2 weeks. She was informed of the importance of frequent follow up visits to maximize her success with intensive lifestyle modifications for her multiple health conditions.  ALLERGIES: Allergies  Allergen Reactions  . Ambien [Zolpidem] Other (See Comments)    NIGHT TERRORS  . Atorvastatin Other (See Comments)    Muscle weakness Muscle Aches  . Codeine Nausea And Vomiting  . Penicillins Itching and Other (See Comments)    Made nose run uncontrollably Has patient had a PCN reaction causing immediate rash, facial/tongue/throat swelling, SOB or lightheadedness with hypotension: No Has patient had a PCN reaction causing severe rash involving mucus membranes or skin necrosis: No Has patient had a PCN reaction that required hospitalization No Has patient had a PCN reaction occurring within the last 10 years: No If all of the above answers are "NO", then may  proceed with Cephalosporin use.  . Simvastatin Other (See Comments)    Muscle weakness  . Meclizine     Felt like her heart was pounding, "made me crazy", didn't help the dizziness  . Byetta 10 Mcg Pen [Exenatide] Nausea Only    MEDICATIONS: Current Outpatient Medications on File Prior to Visit  Medication Sig Dispense Refill  . cholecalciferol (VITAMIN D3) 25  MCG (1000 UT) tablet Take 5,000 Units by mouth daily.    . diazepam (VALIUM) 2 MG tablet TAKE 1 TABLET BY MOUTH TWICE A DAY AS NEEDED FOR VERTIGO    . ferrous sulfate 325 (65 FE) MG tablet Take 325 mg by mouth 2 (two) times daily with a meal.    . gabapentin (NEURONTIN) 300 MG capsule Take 2 capsules (600 mg total) by mouth at bedtime. 180 capsule 4  . hydrochlorothiazide (HYDRODIURIL) 12.5 MG tablet Take 1 tablet (12.5 mg total) by mouth daily. 90 tablet 0  . magnesium oxide (MAG-OX) 400 MG tablet Take 400 mg by mouth at bedtime.     . metFORMIN (GLUCOPHAGE) 500 MG tablet Take 1 tablet (500 mg total) by mouth daily with breakfast. 90 tablet 0  . Multiple Vitamins-Minerals (OCUVITE-LUTEIN PO) Take 1 tablet by mouth daily.    [name] OVER THE COUNTER MEDICATION P5P vitamin    . rosuvastatin (CRESTOR) 20 MG tablet Take 1 tablet (20 mg total) by mouth daily. (Patient taking differently: Take 20 mg by mouth at bedtime. ) 30 tablet 0  . XARELTO 20 MG TABS tablet Take 20 mg by mouth daily.     No current facility-administered medications on file prior to visit.     PAST MEDICAL HISTORY: Past Medical History:  Diagnosis Date  . Anemia   . Arthritis    "knees, hips, possibly back" (04/08/2016)  . Atrial septal aneurysm   . B12 deficiency    a. following gastric bypass.  . Back pain   . Chronic lower back pain    spondylosis  . Cold feet   . Daily headache    "short ones for the last month or 2" (04/08/2016)  . Depression   . Diabetes ([place])   . Dry mouth   . DVT (deep venous thrombosis) ([place])   . Easy bruising   . Floaters in visual field   . GERD (gastroesophageal reflux disease)    takes Protonix daily "just to protect my stomach; not for reflux" (04/08/2016)  . Hay fever   . Heart murmur    "dx'd by 1 anesthesiologist years and years ago"  . History of loop recorder 03/2016  . Hyperlipidemia   . Hypertension   . Joint pain   . Leg cramp   . Macular degeneration    "just watching"  .  Neuropathy   . Nosebleed   . Palpitations   . Peripheral edema   . Peripheral neuropathy    not on any meds  . 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  [name] Pneumonia 12/2017  . Sleep apnea    no CPAP since weight loss after bariatric surgery '12 (04/08/2016)  . Spondylosis   . Stroke ([place]) 04/07/2016   a. 01/2016 and 03/2016. during admission had + LE DVT.  [name] Swelling of extremity   . TIA (transient ischemic attack)   . Trouble in sleeping   . Urinary incontinence   . Weakness     PAST SURGICAL HISTORY: Past Surgical History:  Procedure Laterality Date  . CATARACT  EXTRACTION W/ INTRAOCULAR LENS  IMPLANT, BILATERAL Bilateral ~ 2012  . COLONOSCOPY    . EP IMPLANTABLE DEVICE N/A 04/10/2016   Procedure: Loop Recorder Insertion;  Surgeon: Will Meredith Leeds, MD;  Location: [facility];  Service: Cardiovascular;  Laterality: N/A;  . ESOPHAGOGASTRODUODENOSCOPY     in the 70's  . FACIAL COSMETIC SURGERY    . JOINT REPLACEMENT    . 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 SINUS ENDO W/FUSION  02/28/2016   Procedure: ENDOSCOPIC SINUS SURGERY WITH NAVIGATION;  Surgeon: Ruby Cola, MD;  Location: [facility];  Service: ENT;;  . TEE WITHOUT CARDIOVERSION N/A 04/10/2016   Procedure: TRANSESOPHAGEAL ECHOCARDIOGRAM (TEE) POSSIBLE LOOP;  Surgeon: Sanda Klein, MD;  Location: [facility];  Service: Cardiovascular;  Laterality: N/A;  . TONSILLECTOMY  1957  . TOTAL HIP ARTHROPLASTY Right 2008  . TOTAL HIP REVISION Right 03/30/2016   Procedure: RIGHT TOTAL HIP REVISION;  Surgeon: Frederik Pear, MD;  Location: [facility];  Service: Orthopedics;  Laterality: Right;  . TOTAL KNEE ARTHROPLASTY Bilateral 2001  . VAGINAL HYSTERECTOMY     "left my ovaries"    SOCIAL HISTORY: Social History   Tobacco Use  . Smoking status: Former Smoker    Packs/day: 1.00    Years: 40.00    Pack years: 40.00    Types: Cigarettes    Quit date: 08/2006    Years since quitting: 13.0  . Smokeless tobacco:  Never Used  Substance Use Topics  . Alcohol use: Yes    Alcohol/week: 2.0 standard drinks    Types: 2 Glasses of wine per week    Comment: 07/06/2019 maybe 2 glasses of wine/week; 04/08/2016 "5-6 drinks/year'  . Drug use: No    Types: Marijuana    Comment: 04/08/2016 "did some marijuana in college"    FAMILY HISTORY: Family History  Problem Relation Age of Onset  . Neuropathy Mother   . Hypertension Mother   . Hyperlipidemia Mother   . Thyroid disease Mother   . Prostate cancer Father   . Lymphoma Father   . Hypertension Father   . Hyperlipidemia Father   . Cancer Father   . Obesity Father   . Congestive Heart Failure Maternal Grandmother   . Lung cancer Maternal Grandfather     ROS: Review of Systems  Constitutional: Negative for weight loss.  Gastrointestinal: Negative for nausea and vomiting.  Musculoskeletal:       Negative for muscle weakness  Psychiatric/Behavioral: Positive for depression. Negative for suicidal ideas.    PHYSICAL EXAM: Pulse 70, temperature 97.6 F (36.4 C), temperature source Oral, height 5' 3" (1.6 m), weight 166 lb (75.3 kg), SpO2 99 %. Body mass index is 29.41 kg/m. Physical Exam Vitals signs reviewed.  Constitutional:      Appearance: Normal appearance. She is well-developed. She is obese.  Cardiovascular:     Rate and Rhythm: Normal rate.  Pulmonary:     Effort: Pulmonary effort is normal.  Musculoskeletal: Normal range of motion.  Skin:    General: Skin is warm and dry.  Neurological:     Mental Status: She is alert and oriented to person, place, and time.  Psychiatric:        Mood and Affect: Mood normal.        Behavior: Behavior normal.        Thought Content: Thought content does not include homicidal or suicidal ideation.     RECENT LABS AND TESTS: BMET    Component Value Date/Time  NA 139 06/04/2019 1050   NA 139 06/01/2019 1015   NA 139 04/29/2017 0944   K 3.9 06/04/2019 1050   K 3.9 04/29/2017 0944   CL 105  06/04/2019 1050   CO2 26 06/04/2019 1050   CO2 29 04/29/2017 0944   GLUCOSE 132 (H) 06/04/2019 1050   GLUCOSE 120 04/29/2017 0944   BUN 13 06/04/2019 1050   BUN 23 06/01/2019 1015   BUN 15.8 04/29/2017 0944   CREATININE 0.75 06/04/2019 1050   CREATININE 0.8 04/29/2017 0944   CALCIUM 9.0 06/04/2019 1050   CALCIUM 9.2 04/29/2017 0944   GFRNONAA >60 06/04/2019 1050   GFRAA >60 06/04/2019 1050   Lab Results  Component Value Date   HGBA1C 4.8 05/04/2019   HGBA1C 5.1 11/03/2018   HGBA1C 5.4 08/02/2018   HGBA1C 6.9 (H) 04/27/2018   HGBA1C 6.0 (H) 04/09/2016   Lab Results  Component Value Date   INSULIN 6.9 08/02/2018   INSULIN 7.0 04/27/2018   CBC    Component Value Date/Time   WBC 8.0 06/04/2019 1050   RBC 4.77 06/04/2019 1050   HGB 13.6 06/04/2019 1050   HGB 13.5 05/04/2019 1025   HGB 11.9 04/29/2017 0944   HCT 42.2 06/04/2019 1050   HCT 43.1 05/04/2019 1025   HCT 36.9 04/29/2017 0944   PLT 244 06/04/2019 1050   PLT 275 04/27/2018 0000   MCV 88.5 06/04/2019 1050   MCV 87 05/04/2019 1025   MCV 81.5 04/29/2017 0944   MCH 28.5 06/04/2019 1050   MCHC 32.2 06/04/2019 1050   RDW 15.4 06/04/2019 1050   RDW 18.0 (H) 05/04/2019 1025   RDW 14.2 04/29/2017 0944   LYMPHSABS 1.1 05/04/2019 1025   LYMPHSABS 1.7 04/29/2017 0944   MONOABS 0.6 04/29/2017 0944   EOSABS 0.1 05/04/2019 1025   BASOSABS 0.1 05/04/2019 1025   BASOSABS 0.0 04/29/2017 0944   Iron/TIBC/Ferritin/ %Sat    Component Value Date/Time   IRON 28 11/03/2018 1545   IRON 51 04/29/2017 0944   TIBC 335 11/03/2018 1545   TIBC 343 04/29/2017 0944   FERRITIN 34 11/03/2018 1545   FERRITIN 23 04/29/2017 0944   IRONPCTSAT 8 (LL) 11/03/2018 1545   IRONPCTSAT 15 (L) 04/29/2017 0944   IRONPCTSAT 12 (L) 05/29/2011 1436   Lipid Panel     Component Value Date/Time   CHOL 152 11/03/2018 1545   TRIG 109 11/03/2018 1545   HDL 61 11/03/2018 1545   CHOLHDL 2.7 04/09/2016 0155   VLDL 27 04/09/2016 0155   LDLCALC 69  11/03/2018 1545   Hepatic Function Panel     Component Value Date/Time   PROT 6.5 06/01/2019 1015   PROT 6.8 04/29/2017 0944   ALBUMIN 4.4 06/01/2019 1015   ALBUMIN 3.4 (L) 04/29/2017 0944   AST 30 06/01/2019 1015   AST 17 04/29/2017 0944   ALT 19 06/01/2019 1015   ALT 14 04/29/2017 0944   ALKPHOS 66 06/01/2019 1015   ALKPHOS 116 04/29/2017 0944   BILITOT 0.4 06/01/2019 1015   BILITOT 0.41 04/29/2017 0944   Lab Results  Component Value Date   TSH 2.100 05/04/2019    Results for AKERIA, HEDSTROM (MRN [id number]) as of 08/23/2019 17:01  Ref. Range 05/04/2019 10:25  Vitamin D, 25-Hydroxy Latest Ref Range: 30.0 - 100.0 ng/mL 50.7    OBESITY BEHAVIORAL INTERVENTION VISIT  Today's visit was # 31  Starting weight: 258 lbs Starting date: 04/27/2018 Today's weight : 166 lbs Today's date: 08/23/2019 Total lbs lost to date: 62  08/23/2019  Height 5' 3" (1.6 m)  Weight 166 lb (75.3 kg)  BMI (Calculated) 29.41   Body Fat % 43.9 %  Total Body Water (lbs) 73.6 lbs    ASK: We discussed the diagnosis of obesity with [name] [name] today and [name] [name] agreed to give [place] permission to discuss obesity behavioral modification therapy today.  ASSESS: [name] [name] has the diagnosis of obesity and her BMI today is 29.41 [name] [name] is in the action stage of change   ADVISE: [name] [name] was educated on the multiple health risks of obesity as well as the benefit of weight loss to improve her health. She was advised of the need for long term treatment and the importance of lifestyle modifications to improve her current health and to decrease her risk of future health problems.  AGREE: Multiple dietary modification options and treatment options were discussed and  [name] [name] agreed to follow the recommendations documented in the above note.  ARRANGE: [name] [name] was educated on the importance of frequent visits to treat obesity as outlined per CMS and USPSTF guidelines and agreed to schedule her next follow up  appointment today.  Corey Skains, am acting as Location manager for [name], FNP-C.  I have reviewed the above documentation for accuracy and completeness, and I agree with the above.  -  , FNP-C.

## 2019-08-24 ENCOUNTER — Encounter (INDEPENDENT_AMBULATORY_CARE_PROVIDER_SITE_OTHER): Payer: Self-pay | Admitting: Family Medicine

## 2019-08-24 DIAGNOSIS — F329 Major depressive disorder, single episode, unspecified: Secondary | ICD-10-CM | POA: Insufficient documentation

## 2019-08-24 DIAGNOSIS — F32A Depression, unspecified: Secondary | ICD-10-CM | POA: Insufficient documentation

## 2019-08-25 ENCOUNTER — Ambulatory Visit (INDEPENDENT_AMBULATORY_CARE_PROVIDER_SITE_OTHER): Payer: Medicare Other | Admitting: *Deleted

## 2019-08-25 DIAGNOSIS — I6340 Cerebral infarction due to embolism of unspecified cerebral artery: Secondary | ICD-10-CM

## 2019-08-26 LAB — CUP PACEART REMOTE DEVICE CHECK
Date Time Interrogation Session: 20201204141438
Implantable Pulse Generator Implant Date: 20170721

## 2019-09-07 ENCOUNTER — Ambulatory Visit (INDEPENDENT_AMBULATORY_CARE_PROVIDER_SITE_OTHER): Payer: Medicare Other | Admitting: Family Medicine

## 2019-09-07 ENCOUNTER — Other Ambulatory Visit: Payer: Self-pay

## 2019-09-07 ENCOUNTER — Encounter (INDEPENDENT_AMBULATORY_CARE_PROVIDER_SITE_OTHER): Payer: Self-pay | Admitting: Family Medicine

## 2019-09-07 VITALS — BP 129/81 | HR 55 | Temp 97.6°F | Ht 63.0 in | Wt 160.0 lb

## 2019-09-07 DIAGNOSIS — Z6830 Body mass index (BMI) 30.0-30.9, adult: Secondary | ICD-10-CM | POA: Diagnosis not present

## 2019-09-07 DIAGNOSIS — E66811 Obesity, class 1: Secondary | ICD-10-CM

## 2019-09-07 DIAGNOSIS — E669 Obesity, unspecified: Secondary | ICD-10-CM

## 2019-09-07 DIAGNOSIS — E119 Type 2 diabetes mellitus without complications: Secondary | ICD-10-CM

## 2019-09-11 NOTE — Progress Notes (Signed)
Office: [phone]  /  Fax: [phone]   HPI:  Chief Complaint: OBESITY [name] [name] is here to discuss her progress with her obesity treatment plan. She is on the Category 2 plan and states she is following her eating plan approximately 80 % of the time. She states she is doing cardio exercise and weight machines 60 minutes 4 times per week.  [name] [name] is back on track and she is adhering to the plan well. She admits to "cheating" some on weigh in days but then she gets back on the plan.  Diabetes II [name] [name] has a diagnosis of diabetes type II. Diabetes is well controlled on metformin and essentially in remission due to her success with weight loss. Her last A1c was at 4.8. [name] [name] does not check her blood sugars. She denies hypoglycemia.      Today's visit was # 65 Starting weight: 258 lbs Starting date: 04/27/2018 Today's weight : 160 lbs Today's date: 09/07/2019 Total lbs lost to date: 79 Total lbs lost since last in-office visit: 6  ASSESSMENT AND PLAN:  Type 2 diabetes mellitus without complication, without long-term current use of insulin (HCC)  Class 1 obesity with serious comorbidity and body mass index (BMI) of 30.0 to 30.9 in adult, unspecified obesity type - BMI greater than 30 at start of program   PLAN:  Diabetes II Continue metformin and meal plan.  Obesity [name] [name] is currently in the action stage of change. As such, her goal is to continue with weight loss efforts She has agreed to follow the Category 2 plan [name] [name] will continue cardio exercise and weight machines for 60 minutes, 4 times per week for weight loss and overall health benefits. We discussed the following Behavioral Modification Strategies today: planning for success, decreasing simple carbohydrates  and decrease liquid calories  [name] [name] has agreed to follow up with our clinic in 5 weeks (patient already has 3 week appointment set up). She was informed of the importance of frequent follow up visits to  maximize her success with intensive lifestyle modifications for her multiple health conditions.  ALLERGIES: Allergies  Allergen Reactions  . Ambien [Zolpidem] Other (See Comments)    NIGHT TERRORS  . Atorvastatin Other (See Comments)    Muscle weakness Muscle Aches  . Codeine Nausea And Vomiting  . Penicillins Itching and Other (See Comments)    Made nose run uncontrollably Has patient had a PCN reaction causing immediate rash, facial/tongue/throat swelling, SOB or lightheadedness with hypotension: No Has patient had a PCN reaction causing severe rash involving mucus membranes or skin necrosis: No Has patient had a PCN reaction that required hospitalization No Has patient had a PCN reaction occurring within the last 10 years: No If all of the above answers are "NO", then may proceed with Cephalosporin use.  . Simvastatin Other (See Comments)    Muscle weakness  . Meclizine     Felt like her heart was pounding, "made me crazy", didn't help the dizziness  . Byetta 10 Mcg Pen [Exenatide] Nausea Only    MEDICATIONS: Current Outpatient Medications on File Prior to Visit  Medication Sig Dispense Refill  . cholecalciferol (VITAMIN D3) 25 MCG (1000 UT) tablet Take 5,000 Units by mouth daily.    . diazepam (VALIUM) 2 MG tablet TAKE 1 TABLET BY MOUTH TWICE A DAY AS NEEDED FOR VERTIGO    . ferrous sulfate 325 (65 FE) MG tablet Take 325 mg by mouth 2 (two) times daily with a meal.    . gabapentin (NEURONTIN)  300 MG capsule Take 2 capsules (600 mg total) by mouth at bedtime. 180 capsule 4  . hydrochlorothiazide (HYDRODIURIL) 12.5 MG tablet Take 1 tablet (12.5 mg total) by mouth daily. 90 tablet 0  . magnesium oxide (MAG-OX) 400 MG tablet Take 400 mg by mouth at bedtime.     . metFORMIN (GLUCOPHAGE) 500 MG tablet Take 1 tablet (500 mg total) by mouth daily with breakfast. 90 tablet 0  . Multiple Vitamins-Minerals (OCUVITE-LUTEIN PO) Take 1 tablet by mouth daily.    [name] OVER THE COUNTER MEDICATION  P5P vitamin    . rosuvastatin (CRESTOR) 20 MG tablet Take 1 tablet (20 mg total) by mouth daily. (Patient taking differently: Take 20 mg by mouth at bedtime. ) 30 tablet 0  . XARELTO 20 MG TABS tablet Take 20 mg by mouth daily.     No current facility-administered medications on file prior to visit.    PAST MEDICAL HISTORY: Past Medical History:  Diagnosis Date  . Anemia   . Arthritis    "knees, hips, possibly back" (04/08/2016)  . Atrial septal aneurysm   . B12 deficiency    a. following gastric bypass.  . Back pain   . Chronic lower back pain    spondylosis  . Cold feet   . Daily headache    "short ones for the last month or 2" (04/08/2016)  . Depression   . Diabetes ([place])   . Dry mouth   . DVT (deep venous thrombosis) ([place])   . Easy bruising   . Floaters in visual field   . GERD (gastroesophageal reflux disease)    takes Protonix daily "just to protect my stomach; not for reflux" (04/08/2016)  . Hay fever   . Heart murmur    "dx'd by 1 anesthesiologist years and years ago"  . History of loop recorder 03/2016  . Hyperlipidemia   . Hypertension   . Joint pain   . Leg cramp   . Macular degeneration    "just watching"  . Neuropathy   . Nosebleed   . Palpitations   . Peripheral edema   . Peripheral neuropathy    not on any meds  . 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  [name] Pneumonia 12/2017  . Sleep apnea    no CPAP since weight loss after bariatric surgery '12 (04/08/2016)  . Spondylosis   . Stroke ([place]) 04/07/2016   a. 01/2016 and 03/2016. during admission had + LE DVT.  [name] Swelling of extremity   . TIA (transient ischemic attack)   . Trouble in sleeping   . Urinary incontinence   . Weakness     PAST SURGICAL HISTORY: Past Surgical History:  Procedure Laterality Date  . CATARACT EXTRACTION W/ INTRAOCULAR LENS  IMPLANT, BILATERAL Bilateral ~ 2012  . COLONOSCOPY    . EP IMPLANTABLE DEVICE N/A 04/10/2016   Procedure: Loop Recorder Insertion;   Surgeon: Will Meredith Leeds, MD;  Location: [facility];  Service: Cardiovascular;  Laterality: N/A;  . ESOPHAGOGASTRODUODENOSCOPY     in the 70's  . FACIAL COSMETIC SURGERY    . JOINT REPLACEMENT    . 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 SINUS ENDO W/FUSION  02/28/2016   Procedure: ENDOSCOPIC SINUS SURGERY WITH NAVIGATION;  Surgeon: Ruby Cola, MD;  Location: [facility];  Service: ENT;;  . TEE WITHOUT CARDIOVERSION N/A 04/10/2016   Procedure: TRANSESOPHAGEAL ECHOCARDIOGRAM (TEE) POSSIBLE LOOP;  Surgeon: Sanda Klein, MD;  Location: [facility];  Service: Cardiovascular;  Laterality: N/A;  . TONSILLECTOMY  1957  . TOTAL HIP ARTHROPLASTY Right 2008  . TOTAL HIP REVISION Right 03/30/2016   Procedure: RIGHT TOTAL HIP REVISION;  Surgeon: Frederik Pear, MD;  Location: [facility];  Service: Orthopedics;  Laterality: Right;  . TOTAL KNEE ARTHROPLASTY Bilateral 2001  . VAGINAL HYSTERECTOMY     "left my ovaries"    SOCIAL HISTORY: Social History   Tobacco Use  . Smoking status: Former Smoker    Packs/day: 1.00    Years: 40.00    Pack years: 40.00    Types: Cigarettes    Quit date: 08/2006    Years since quitting: 13.0  . Smokeless tobacco: Never Used  Substance Use Topics  . Alcohol use: Yes    Alcohol/week: 2.0 standard drinks    Types: 2 Glasses of wine per week    Comment: 07/06/2019 maybe 2 glasses of wine/week; 04/08/2016 "5-6 drinks/year'  . Drug use: No    Types: Marijuana    Comment: 04/08/2016 "did some marijuana in college"    FAMILY HISTORY: Family History  Problem Relation Age of Onset  . Neuropathy Mother   . Hypertension Mother   . Hyperlipidemia Mother   . Thyroid disease Mother   . Prostate cancer Father   . Lymphoma Father   . Hypertension Father   . Hyperlipidemia Father   . Cancer Father   . Obesity Father   . Congestive Heart Failure Maternal Grandmother   . Lung cancer Maternal Grandfather     ROS: Review of Systems  Constitutional: Positive for  weight loss.  Endo/Heme/Allergies:       Negative for hypoglycemia    PHYSICAL EXAM: Blood pressure 129/81, pulse (!) 55, temperature 97.6 F (36.4 C), temperature source Oral, height _0  (1.6 m), weight 160 lb (72.6 kg), SpO2 98 %. Body mass index is 28.34 kg/m. Physical Exam Vitals reviewed.  Constitutional:      General: She is not in acute distress.    Appearance: Normal appearance. She is well-developed. She is obese.  Cardiovascular:     Rate and Rhythm: Normal rate.  Pulmonary:     Effort: Pulmonary effort is normal.  Musculoskeletal:        General: Normal range of motion.  Skin:    General: Skin is warm and dry.  Neurological:     Mental Status: She is alert and oriented to person, place, and time.  Psychiatric:        Mood and Affect: Mood normal.        Behavior: Behavior normal.     RECENT LABS AND TESTS: BMET    Component Value Date/Time   NA 139 06/04/2019 1050   NA 139 06/01/2019 1015   NA 139 04/29/2017 0944   K 3.9 06/04/2019 1050   K 3.9 04/29/2017 0944   CL 105 06/04/2019 1050   CO2 26 06/04/2019 1050   CO2 29 04/29/2017 0944   GLUCOSE 132 (H) 06/04/2019 1050   GLUCOSE 120 04/29/2017 0944   BUN 13 06/04/2019 1050   BUN 23 06/01/2019 1015   BUN 15.8 04/29/2017 0944   CREATININE 0.75 06/04/2019 1050   CREATININE 0.8 04/29/2017 0944   CALCIUM 9.0 06/04/2019 1050   CALCIUM 9.2 04/29/2017 0944   GFRNONAA >60 06/04/2019 1050   GFRAA >60 06/04/2019 1050   Lab Results  Component Value Date   HGBA1C 4.8 05/04/2019   HGBA1C 5.1 11/03/2018   HGBA1C 5.4 08/02/2018   HGBA1C 6.9 (H)  04/27/2018   HGBA1C 6.0 (H) 04/09/2016   Lab Results  Component Value Date   INSULIN 6.9 08/02/2018   INSULIN 7.0 04/27/2018   CBC    Component Value Date/Time   WBC 8.0 06/04/2019 1050   RBC 4.77 06/04/2019 1050   HGB 13.6 06/04/2019 1050   HGB 13.5 05/04/2019 1025   HGB 11.9 04/29/2017 0944   HCT 42.2 06/04/2019 1050   HCT 43.1 05/04/2019 1025   HCT  36.9 04/29/2017 0944   PLT 244 06/04/2019 1050   PLT 275 04/27/2018 0000   MCV 88.5 06/04/2019 1050   MCV 87 05/04/2019 1025   MCV 81.5 04/29/2017 0944   MCH 28.5 06/04/2019 1050   MCHC 32.2 06/04/2019 1050   RDW 15.4 06/04/2019 1050   RDW 18.0 (H) 05/04/2019 1025   RDW 14.2 04/29/2017 0944   LYMPHSABS 1.1 05/04/2019 1025   LYMPHSABS 1.7 04/29/2017 0944   MONOABS 0.6 04/29/2017 0944   EOSABS 0.1 05/04/2019 1025   BASOSABS 0.1 05/04/2019 1025   BASOSABS 0.0 04/29/2017 0944   Iron/TIBC/Ferritin/ %Sat    Component Value Date/Time   IRON 28 11/03/2018 1545   IRON 51 04/29/2017 0944   TIBC 335 11/03/2018 1545   TIBC 343 04/29/2017 0944   FERRITIN 34 11/03/2018 1545   FERRITIN 23 04/29/2017 0944   IRONPCTSAT 8 (LL) 11/03/2018 1545   IRONPCTSAT 15 (L) 04/29/2017 0944   IRONPCTSAT 12 (L) 05/29/2011 1436   Lipid Panel     Component Value Date/Time   CHOL 152 11/03/2018 1545   TRIG 109 11/03/2018 1545   HDL 61 11/03/2018 1545   CHOLHDL 2.7 04/09/2016 0155   VLDL 27 04/09/2016 0155   LDLCALC 69 11/03/2018 1545   Hepatic Function Panel     Component Value Date/Time   PROT 6.5 06/01/2019 1015   PROT 6.8 04/29/2017 0944   ALBUMIN 4.4 06/01/2019 1015   ALBUMIN 3.4 (L) 04/29/2017 0944   AST 30 06/01/2019 1015   AST 17 04/29/2017 0944   ALT 19 06/01/2019 1015   ALT 14 04/29/2017 0944   ALKPHOS 66 06/01/2019 1015   ALKPHOS 116 04/29/2017 0944   BILITOT 0.4 06/01/2019 1015   BILITOT 0.41 04/29/2017 0944   Lab Results  Component Value Date   TSH 2.100 05/04/2019     OBESITY BEHAVIORAL INTERVENTION VISIT DOCUMENTATION FOR INSURANCE (~15 minutes)    ASK: We discussed the diagnosis of obesity with [name] [name] today and [name] [name] agreed to give [place] permission to discuss obesity behavioral modification therapy today.  ASSESS: [name] [name] has the diagnosis of obesity and her BMI today is 28.35 [name] [name] is in the action stage of change   ADVISE: [name] [name] was educated on the  multiple health risks of obesity as well as the benefit of weight loss to improve her health. She was advised of the need for long term treatment and the importance of lifestyle modifications to improve her current health and to decrease her risk of future health problems.  AGREE: Multiple dietary modification options and treatment options were discussed and  [name] [name] agreed to follow the recommendations documented in the above note.  ARRANGE: [name] [name] was educated on the importance of frequent visits to treat obesity as outlined per CMS and USPSTF guidelines and agreed to schedule her next follow up appointment today.    I, [name] [name], am acting as transcriptionist for [name], FNP-C  I have reviewed the above documentation for accuracy and completeness, and I agree with the above.  -  [name], FNP-C.

## 2019-09-17 ENCOUNTER — Encounter (INDEPENDENT_AMBULATORY_CARE_PROVIDER_SITE_OTHER): Payer: Self-pay | Admitting: Family Medicine

## 2019-09-27 ENCOUNTER — Ambulatory Visit (INDEPENDENT_AMBULATORY_CARE_PROVIDER_SITE_OTHER): Payer: Medicare Other | Admitting: *Deleted

## 2019-09-27 DIAGNOSIS — I6340 Cerebral infarction due to embolism of unspecified cerebral artery: Secondary | ICD-10-CM | POA: Diagnosis not present

## 2019-09-28 ENCOUNTER — Encounter (INDEPENDENT_AMBULATORY_CARE_PROVIDER_SITE_OTHER): Payer: Self-pay | Admitting: Family Medicine

## 2019-09-28 ENCOUNTER — Ambulatory Visit (INDEPENDENT_AMBULATORY_CARE_PROVIDER_SITE_OTHER): Payer: Medicare Other | Admitting: Family Medicine

## 2019-09-28 ENCOUNTER — Other Ambulatory Visit: Payer: Self-pay

## 2019-09-28 VITALS — BP 163/84 | HR 63 | Temp 98.0°F | Ht 63.0 in | Wt 160.0 lb

## 2019-09-28 DIAGNOSIS — E119 Type 2 diabetes mellitus without complications: Secondary | ICD-10-CM | POA: Diagnosis not present

## 2019-09-28 DIAGNOSIS — I10 Essential (primary) hypertension: Secondary | ICD-10-CM | POA: Diagnosis not present

## 2019-09-28 DIAGNOSIS — E66811 Obesity, class 1: Secondary | ICD-10-CM

## 2019-09-28 DIAGNOSIS — Z6830 Body mass index (BMI) 30.0-30.9, adult: Secondary | ICD-10-CM | POA: Diagnosis not present

## 2019-09-28 DIAGNOSIS — E669 Obesity, unspecified: Secondary | ICD-10-CM | POA: Diagnosis not present

## 2019-09-28 LAB — CUP PACEART REMOTE DEVICE CHECK
Date Time Interrogation Session: 20210106143456
Implantable Pulse Generator Implant Date: 20170721

## 2019-09-28 MED ORDER — METFORMIN HCL 500 MG PO TABS: 500.0000 mg | ORAL_TABLET | Freq: Every day | ORAL | 0 refills | Status: DC

## 2019-09-28 MED ORDER — HYDROCHLOROTHIAZIDE 12.5 MG PO TABS: 12.5000 mg | ORAL_TABLET | Freq: Every day | ORAL | 0 refills | Status: DC

## 2019-10-03 NOTE — Progress Notes (Signed)
Chief Complaint:   OBESITY [name] [name] is here to discuss her progress with her obesity treatment plan along with follow-up of her obesity related diagnoses. [name] [name] is on the Category 2 Plan and states she is following her eating plan approximately 75% of the time. [name] [name] states she is doing cardio exercise and machines 60 minutes 5 times per week.  Today's visit was #: 39 Starting weight: 258 lbs Starting date: 04/27/2018 Today's weight: 160 lbs Today's date: 09/28/2019 Total lbs lost to date: 98 Total lbs lost since last in-office visit: 0  Interim History: [name] [name] has done very well with weight loss and she is nearly at her goal weight of 155 pounds. She is currently getting consultations for skin removal surgeries. She was somewhat off the plan over the holidays but she is now back on track. She does admit to drinking more wine than usual.  Subjective:   Essential hypertension [name] [name]'s blood pressure is high today, but it is usually well controlled. She currently takes 12.5 mg HCTZ for HTN management. She has no chest pain or shortness of breath. She reports occasional dizziness. BP Readings from Last 3 Encounters:  09/28/19 (!) 163/84  09/07/19 129/81  08/03/19 128/81    Type 2 diabetes mellitus without complication, without long-term current use of insulin ([place]) Diabetes is well controlled with metformin. Her last A1c was at 4.8 and her diabetes is essentially resolved. She has no polyphagia or hypoglycemia.  Assessment/Plan:   Essential hypertension [name] [name] is working on healthy weight loss and exercise to improve blood pressure control.  [name] [name] agrees to continue HCTZ 12.5 mg daily #90 with no refills. She will check her blood pressure at times of dizziness. She will increase her fluid intake.  Type 2 diabetes mellitus without complication, without long-term current use of insulin (HCC)   Intensive lifestyle modification including diet, exercise and weight loss were  discussed as the first line treatment for diabetes. [name] [name] agrees to continue metformin 500 mg daily with breakfast #90 with no refills. She will check CBGs with dizziness.  Obesity [name] [name] is currently in the action stage of change. As such, her goal is to continue with weight loss efforts. She has agreed to on the Category 2 Plan +300 calories.   We discussed the following exercise goals today: will continue current exercise regimen for weight loss and overall health benefits.  We discussed the following behavioral modification strategies today: increasing lean protein intake, decreasing simple carbohydrates, decreasing liquid calories and planning for success.  [name] SCARBROUGH has agreed to follow-up with our clinic in 2 weeks. She was informed of the importance of frequent follow-up visits to maximize her success with intensive lifestyle modifications for her multiple health conditions.   Objective:   Blood pressure (!) 163/84, pulse 63, temperature 98 F (36.7 C), temperature source Oral, height _0  (1.6 m), weight 160 lb (72.6 kg), SpO2 99 %. Body mass index is 28.34 kg/m.  General: Cooperative, alert, well developed, in no acute distress. HEENT: Conjunctivae and lids unremarkable. Neck: No thyromegaly.  Cardiovascular: Regular rhythm.  Lungs: Normal work of breathing. Extremities: No edema.  Neurologic: No focal deficits.   Lab Results  Component Value Date   CREATININE 0.75 06/04/2019   BUN 13 06/04/2019   NA 139 06/04/2019   K 3.9 06/04/2019   CL 105 06/04/2019   CO2 26 06/04/2019   Lab Results  Component Value Date   ALT 19 06/01/2019   AST 30 06/01/2019  ALKPHOS 66 06/01/2019   BILITOT 0.4 06/01/2019   Lab Results  Component Value Date   HGBA1C 4.8 05/04/2019   HGBA1C 5.1 11/03/2018   HGBA1C 5.4 08/02/2018   HGBA1C 6.9 (H) 04/27/2018   HGBA1C 6.0 (H) 04/09/2016   Lab Results  Component Value Date   INSULIN 6.9 08/02/2018   INSULIN 7.0 04/27/2018    Lab Results  Component Value Date   TSH 2.100 05/04/2019   Lab Results  Component Value Date   CHOL 152 11/03/2018   HDL 61 11/03/2018   LDLCALC 69 11/03/2018   TRIG 109 11/03/2018   CHOLHDL 2.7 04/09/2016   Lab Results  Component Value Date   WBC 8.0 06/04/2019   HGB 13.6 06/04/2019   HCT 42.2 06/04/2019   MCV 88.5 06/04/2019   PLT 244 06/04/2019   Lab Results  Component Value Date   IRON 28 11/03/2018   TIBC 335 11/03/2018   FERRITIN 34 11/03/2018    Ref. Range 05/04/2019 10:25  Vitamin D, 25-Hydroxy Latest Ref Range: 30.0 - 100.0 ng/mL 50.7    Obesity Behavioral Intervention Documentation for Insurance:   Approximately 15 minutes were spent on the discussion below.  ASK: We discussed the diagnosis of obesity with [name] [name] today and [name] [name] agreed to give [place] permission to discuss obesity behavioral modification therapy today.  ASSESS: [name] [name] has the diagnosis of obesity and her BMI today is 28.35. Kahlia is in the action stage of change.   ADVISE: [name] [name] was educated on the multiple health risks of obesity as well as the benefit of weight loss to improve her health. She was advised of the need for long term treatment and the importance of lifestyle modifications to improve her current health and to decrease her risk of future health problems.  AGREE: Multiple dietary modification options and treatment options were discussed and [name] [name] agreed to follow the recommendations documented in the above note.  ARRANGE: [name] [name] was educated on the importance of frequent visits to treat obesity as outlined per CMS and USPSTF guidelines and agreed to schedule her next follow up appointment today.  Attestation Statements:   Reviewed by clinician on day of visit: allergies, medications, problem list, medical history, surgical history, family history, social history, and previous encounter notes.  [name] [name], am acting as Location manager for [name] Schwab,  FNP-C.  I have reviewed the above documentation for accuracy and completeness, and I agree with the above. -  Georgianne Fick, FNP

## 2019-10-04 ENCOUNTER — Encounter (INDEPENDENT_AMBULATORY_CARE_PROVIDER_SITE_OTHER): Payer: Self-pay | Admitting: Family Medicine

## 2019-10-05 ENCOUNTER — Telehealth: Payer: Self-pay | Admitting: Plastic Surgery

## 2019-10-05 NOTE — Telephone Encounter (Signed)
lvm for pt to callback and schedule virtual visit with Dr. Marla Roe to discuss surgery options.

## 2019-10-12 ENCOUNTER — Ambulatory Visit (INDEPENDENT_AMBULATORY_CARE_PROVIDER_SITE_OTHER): Payer: Medicare Other | Admitting: Family Medicine

## 2019-10-12 ENCOUNTER — Other Ambulatory Visit: Payer: Self-pay

## 2019-10-12 ENCOUNTER — Encounter (INDEPENDENT_AMBULATORY_CARE_PROVIDER_SITE_OTHER): Payer: Self-pay | Admitting: Family Medicine

## 2019-10-12 VITALS — BP 153/76 | HR 60 | Temp 97.8°F | Ht 63.0 in | Wt 160.0 lb

## 2019-10-12 DIAGNOSIS — E119 Type 2 diabetes mellitus without complications: Secondary | ICD-10-CM | POA: Diagnosis not present

## 2019-10-12 DIAGNOSIS — E538 Deficiency of other specified B group vitamins: Secondary | ICD-10-CM | POA: Diagnosis not present

## 2019-10-12 DIAGNOSIS — E7849 Other hyperlipidemia: Secondary | ICD-10-CM | POA: Diagnosis not present

## 2019-10-12 DIAGNOSIS — I10 Essential (primary) hypertension: Secondary | ICD-10-CM

## 2019-10-12 DIAGNOSIS — L821 Other seborrheic keratosis: Secondary | ICD-10-CM | POA: Diagnosis not present

## 2019-10-12 DIAGNOSIS — Z6830 Body mass index (BMI) 30.0-30.9, adult: Secondary | ICD-10-CM

## 2019-10-12 DIAGNOSIS — E559 Vitamin D deficiency, unspecified: Secondary | ICD-10-CM | POA: Diagnosis not present

## 2019-10-12 DIAGNOSIS — E66811 Obesity, class 1: Secondary | ICD-10-CM

## 2019-10-12 DIAGNOSIS — D71 Functional disorders of polymorphonuclear neutrophils: Secondary | ICD-10-CM | POA: Diagnosis not present

## 2019-10-12 DIAGNOSIS — L570 Actinic keratosis: Secondary | ICD-10-CM | POA: Diagnosis not present

## 2019-10-12 DIAGNOSIS — E669 Obesity, unspecified: Secondary | ICD-10-CM

## 2019-10-12 DIAGNOSIS — D485 Neoplasm of uncertain behavior of skin: Secondary | ICD-10-CM | POA: Diagnosis not present

## 2019-10-12 MED ORDER — LOSARTAN POTASSIUM-HCTZ 50-12.5 MG PO TABS: 1.0000 | ORAL_TABLET | Freq: Every day | ORAL | 0 refills | Status: DC

## 2019-10-12 NOTE — Progress Notes (Signed)
Chief Complaint:   OBESITY BRYN SALINE is here to discuss her progress with her obesity treatment plan along with follow-up of her obesity related diagnoses. Bentli is on the Category 2 Plan + 300 calories and states she is following her eating plan approximately 80% of the time. Netasha states she is doing cardio/exercise machine 30-40 minutes 5 times per week.  Today's visit was #: 54 Starting weight: 258 lbs Starting date: 04/27/2018 Today's weight: 160 lbs Today's date: 10/12/2019 Total lbs lost to date: 98  Total lbs lost since last in-office visit: 0  Interim History: Noemy is coming to realize she may be at her goal weight (160 lbs). She has been struggling recently to make headway toward her goal of 150-155 lbs. We discussed that 160 is a reasonable goal for her. She is sticking to Category 2 mostly but sometimes eats the extra 300 allowed calories.  Subjective:   Essential hypertension. Blood pressure has been elevated the last two visits. No chest pain or shortness of breath. She has a history of a stroke.  She is on HCTZ 12.5 mg daily.  BP Readings from Last 3 Encounters:  10/12/19 (!) 153/76  09/28/19 (!) 163/84  09/07/19 129/81   Lab Results  Component Value Date   CREATININE 0.75 06/04/2019   CREATININE 0.82 06/01/2019   CREATININE 0.84 05/04/2019   Vitamin D deficiency. Dorothea is on OTC Vitamin D 5,000 daily. Last Vitamin D level was at goal (50.7 on 05/04/2019).   Other hyperlipidemia. Last LDL at goal at 69. She is on Crestor 20 mg.  Lab Results  Component Value Date   CHOL 152 11/03/2018   HDL 61 11/03/2018   LDLCALC 69 11/03/2018   TRIG 109 11/03/2018   CHOLHDL 2.7 04/09/2016   Lab Results  Component Value Date   ALT 19 06/01/2019   AST 30 06/01/2019   ALKPHOS 66 06/01/2019   BILITOT 0.4 06/01/2019   The ASCVD Risk score Mikey Bussing DC Jr., et al., 2013) failed to calculate for the following reasons:   The patient has a prior MI or stroke  diagnosis.  Type 2 diabetes mellitus without complication, without long-term current use of insulin ([place]).  DM is well-controlled on metformin. No polyphagia. She does admit to carb cravings.  Lab Results  Component Value Date   HGBA1C 4.8 05/04/2019   HGBA1C 5.1 11/03/2018   HGBA1C 5.4 08/02/2018   Lab Results  Component Value Date   LDLCALC 69 11/03/2018   CREATININE 0.75 06/04/2019   Lab Results  Component Value Date   INSULIN 6.9 08/02/2018   INSULIN 7.0 04/27/2018   B12 deficiency. Kimari is not on a supplement. She has a history of low B12. OTC supplementation was discontinued due to high B12 level (1393 on 05/04/19). However our plan is rich in B12.  Lab Results  Component Value Date   VITAMINB12 1,393 (H) 05/04/2019    Assessment/Plan:   Essential hypertension. Maya is working on healthy weight loss and exercise to improve blood pressure control. [name] will discontinue HCTZ. She was given a new prescription for losartan-hydrochlorothiazide (HYZAAR) 50-12.5 MG tablet daily #30 with 0 refills.  Vitamin D deficiency. Continue OTC vitamin D 5000 IU. Check vitamin D level today.  She was encouraged to take 1200 mg calcium daily.  Other hyperlipidemia.    Continue Crestor Lipid Panel With LDL/HDL Ratio ordered.  Type 2 diabetes mellitus without complication, without long-term current use of insulin ([place]).  Continue metformin  at current dose. Comprehensive metabolic panel, Hemoglobin A1c, Insulin, random ordered.  B12 deficiency.   Check B12 today.  Class 1 obesity with serious comorbidity and body mass index (BMI) of 30.0 to 30.9 in adult, unspecified obesity type - BMI greater than 30 at start of program.  Hanako is currently in the action stage of change. As such, her goal is to continue with weight loss efforts. She has agreed to the Category 2 Plan + 300 calories.  Exercise goals: Faylene will continue her current exercise regimen.  Behavioral  modification strategies: decreasing simple carbohydrates.  Otila has agreed to follow-up with our clinic in 4 weeks. She was informed of the importance of frequent follow-up visits to maximize her success with intensive lifestyle modifications for her multiple health conditions.   Alexandrea was informed we would discuss her lab results at her next visit unless there is a critical issue that needs to be addressed sooner. Dailin agreed to keep her next visit at the agreed upon time to discuss these results.  Objective:   Blood pressure (!) 153/76, pulse 60, temperature 97.8 F (36.6 C), temperature source Oral, height _0  (1.6 m), weight 160 lb (72.6 kg), SpO2 99 %. Body mass index is 28.34 kg/m.  General: Cooperative, alert, well developed, in no acute distress. HEENT: Conjunctivae and lids unremarkable. Cardiovascular: Regular rhythm.  Lungs: Normal work of breathing. Neurologic: No focal deficits.   Lab Results  Component Value Date   CREATININE 0.75 06/04/2019   BUN 13 06/04/2019   NA 139 06/04/2019   K 3.9 06/04/2019   CL 105 06/04/2019   CO2 26 06/04/2019   Lab Results  Component Value Date   ALT 19 06/01/2019   AST 30 06/01/2019   ALKPHOS 66 06/01/2019   BILITOT 0.4 06/01/2019   Lab Results  Component Value Date   HGBA1C 4.8 05/04/2019   HGBA1C 5.1 11/03/2018   HGBA1C 5.4 08/02/2018   HGBA1C 6.9 (H) 04/27/2018   HGBA1C 6.0 (H) 04/09/2016   Lab Results  Component Value Date   INSULIN 6.9 08/02/2018   INSULIN 7.0 04/27/2018   Lab Results  Component Value Date   TSH 2.100 05/04/2019   Lab Results  Component Value Date   CHOL 152 11/03/2018   HDL 61 11/03/2018   LDLCALC 69 11/03/2018   TRIG 109 11/03/2018   CHOLHDL 2.7 04/09/2016   Lab Results  Component Value Date   WBC 8.0 06/04/2019   HGB 13.6 06/04/2019   HCT 42.2 06/04/2019   MCV 88.5 06/04/2019   PLT 244 06/04/2019   Lab Results  Component Value Date   IRON 28 11/03/2018   TIBC 335  11/03/2018   FERRITIN 34 11/03/2018   Obesity Behavioral Intervention Documentation for Insurance:   Approximately 15 minutes were spent on the discussion below.  ASK: We discussed the diagnosis of obesity with [name] today and Arthi agreed to give [place] permission to discuss obesity behavioral modification therapy today.  ASSESS: Mija has the diagnosis of obesity and her BMI today is 28.3. Harper is in the action stage of change.   ADVISE: Beyonca was educated on the multiple health risks of obesity as well as the benefit of weight loss to improve her health. She was advised of the need for long term treatment and the importance of lifestyle modifications to improve her current health and to decrease her risk of future health problems.  AGREE: Multiple dietary modification options and treatment options were discussed and Tamaya agreed to  follow the recommendations documented in the above note.  ARRANGE: Inga was educated on the importance of frequent visits to treat obesity as outlined per CMS and USPSTF guidelines and agreed to schedule her next follow up appointment today.  Attestation Statements:   Reviewed by clinician on day of visit: allergies, medications, problem list, medical history, surgical history, family history, social history, and previous encounter notes.  IMichaelene Song, am acting as Location manager for [name], FNP   I have reviewed the above documentation for accuracy and completeness, and I agree with the above. -  Georgianne Fick, FNP

## 2019-10-13 LAB — COMPREHENSIVE METABOLIC PANEL
ALT: 19 IU/L (ref 0–32)
AST: 26 IU/L (ref 0–40)
Albumin/Globulin Ratio: 2 (ref 1.2–2.2)
Albumin: 4.3 g/dL (ref 3.8–4.8)
Alkaline Phosphatase: 69 IU/L (ref 39–117)
BUN/Creatinine Ratio: 19 (ref 12–28)
BUN: 15 mg/dL (ref 8–27)
Bilirubin Total: 0.3 mg/dL (ref 0.0–1.2)
CO2: 26 mmol/L (ref 20–29)
Calcium: 9.5 mg/dL (ref 8.7–10.3)
Chloride: 100 mmol/L (ref 96–106)
Creatinine, Ser: 0.8 mg/dL (ref 0.57–1.00)
GFR calc Af Amer: 88 mL/min/{1.73_m2} (ref >59)
GFR calc non Af Amer: 76 mL/min/{1.73_m2} (ref >59)
Globulin, Total: 2.2 g/dL (ref 1.5–4.5)
Glucose: 80 mg/dL (ref 65–99)
Potassium: 4.2 mmol/L (ref 3.5–5.2)
Sodium: 139 mmol/L (ref 134–144)
Total Protein: 6.5 g/dL (ref 6.0–8.5)

## 2019-10-13 LAB — VITAMIN D 25 HYDROXY (VIT D DEFICIENCY, FRACTURES): Vit D, 25-Hydroxy: 48.3 ng/mL (ref 30.0–100.0)

## 2019-10-13 LAB — LIPID PANEL WITH LDL/HDL RATIO
Cholesterol, Total: 157 mg/dL (ref 100–199)
HDL: 79 mg/dL (ref >39)
LDL Chol Calc (NIH): 65 mg/dL (ref 0–99)
LDL/HDL Ratio: 0.8 ratio (ref 0.0–3.2)
Triglycerides: 68 mg/dL (ref 0–149)
VLDL Cholesterol Cal: 13 mg/dL (ref 5–40)

## 2019-10-13 LAB — VITAMIN B12: Vitamin B-12: 638 pg/mL (ref 232–1245)

## 2019-10-13 LAB — HEMOGLOBIN A1C
Est. average glucose Bld gHb Est-mCnc: 85 mg/dL
Hgb A1c MFr Bld: 4.6 % — ABNORMAL LOW (ref 4.8–5.6)

## 2019-10-13 LAB — INSULIN, RANDOM: INSULIN: 3.5 u[IU]/mL (ref 2.6–24.9)

## 2019-10-20 ENCOUNTER — Other Ambulatory Visit: Payer: Self-pay

## 2019-10-20 ENCOUNTER — Telehealth (INDEPENDENT_AMBULATORY_CARE_PROVIDER_SITE_OTHER): Payer: Medicare Other | Admitting: Plastic Surgery

## 2019-10-20 DIAGNOSIS — Z719 Counseling, unspecified: Secondary | ICD-10-CM

## 2019-10-20 NOTE — Progress Notes (Signed)
The patient is a 69 year old female joining me for a phone discussion.  She is interested in cosmetic surgery.  She had the brachioplasty done already by Dr. Percell Miller.  She is interested in abdominoplasty and mastopexy.  Due to her comorbidities I am very concerned about limiting the length of her surgery.  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  She is on Xarelto as well.  Her risk is extremely high.  I have stated that I would be willing to do both of the surgeries but not together.  She asked for a quote.  Were happy to provide that with her.  I understand she may seek care where she can have both surgeries at the same time.  I completely understand and this is up to her judgment.  My main concern is for her overall safety and wellbeing.

## 2019-10-25 ENCOUNTER — Encounter (INDEPENDENT_AMBULATORY_CARE_PROVIDER_SITE_OTHER): Payer: Self-pay | Admitting: Family Medicine

## 2019-10-25 ENCOUNTER — Ambulatory Visit (INDEPENDENT_AMBULATORY_CARE_PROVIDER_SITE_OTHER): Payer: Medicare Other | Admitting: Family Medicine

## 2019-10-25 ENCOUNTER — Other Ambulatory Visit: Payer: Self-pay

## 2019-10-25 VITALS — BP 137/80 | HR 67 | Temp 97.8°F | Ht 63.0 in | Wt 155.0 lb

## 2019-10-25 DIAGNOSIS — Z20822 Contact with and (suspected) exposure to covid-19: Secondary | ICD-10-CM | POA: Diagnosis not present

## 2019-10-25 DIAGNOSIS — I10 Essential (primary) hypertension: Secondary | ICD-10-CM | POA: Diagnosis not present

## 2019-10-25 DIAGNOSIS — Z6830 Body mass index (BMI) 30.0-30.9, adult: Secondary | ICD-10-CM

## 2019-10-25 DIAGNOSIS — E669 Obesity, unspecified: Secondary | ICD-10-CM | POA: Diagnosis not present

## 2019-10-25 DIAGNOSIS — Z01818 Encounter for other preprocedural examination: Secondary | ICD-10-CM | POA: Diagnosis not present

## 2019-10-25 NOTE — Progress Notes (Signed)
Chief Complaint:   OBESITY [name] [name] is here to discuss her progress with her obesity treatment plan along with follow-up of her obesity related diagnoses. [name] [name] is on the Category 2 Plan and states she is following her eating plan approximately 75% of the time. [name] [name] states she is cardio exercise and machines 60 minutes 5 times per week.  Today's visit was #: 20 Starting weight: 258 lbs Starting date: 04/27/2018 Today's weight: 155 lbs Today's date: 10/25/2019 Total lbs lost to date: 103 Total lbs lost since last in-office visit: 5  Interim History: [name] [name] reports being off the plan until the past week or so. She is now back on track. Overall she has done remarkably well losing 103 lbs since she started at our clinic. She is having a facelift tomorrow and then she plans on abdominoplasty and breast lift.  Subjective:   Essential hypertension [name] [name]'s blood pressure is improved today. Losartan was added at the last visit. She is on Losartan-HCTZ.  BP Readings from Last 3 Encounters:  10/25/19 137/80  10/12/19 (!) 153/76  09/28/19 (!) 163/84   Lab Results  Component Value Date   CREATININE 0.80 10/12/2019   CREATININE 0.75 06/04/2019   CREATININE 0.82 06/01/2019   Assessment/Plan:   Essential hypertension [name] [name] is working on healthy weight loss and exercise to improve blood pressure control. [name] [name] will continue Losartan-HCTZ.   Class 1 obesity with serious comorbidity and body mass index (BMI) of 30.0 to 30.9 in adult, unspecified obesity type - BMI greater than 30 at start of program  [name] [name] is currently in the action stage of change. As such, her goal is to continue with weight loss efforts. She has agreed to the Category 2 Plan.   Exercise goals: [name] [name] will continue her current exercise regimen.  Behavioral modification strategies: decreasing simple carbohydrates and planning for success.  [name] [name] has agreed to follow-up with our clinic in 4 weeks. She was  informed of the importance of frequent follow-up visits to maximize her success with intensive lifestyle modifications for her multiple health conditions.   Objective:   Blood pressure 137/80, pulse 67, temperature 97.8 F (36.6 C), temperature source Oral, height _0  (1.6 m), weight 155 lb (70.3 kg), SpO2 100 %. Body mass index is 27.46 kg/m.  General: Cooperative, alert, well developed, in no acute distress. HEENT: Conjunctivae and lids unremarkable. Cardiovascular: Regular rhythm.  Lungs: Normal work of breathing. Neurologic: No focal deficits.   Lab Results  Component Value Date   CREATININE 0.80 10/12/2019   BUN 15 10/12/2019   NA 139 10/12/2019   K 4.2 10/12/2019   CL 100 10/12/2019   CO2 26 10/12/2019   Lab Results  Component Value Date   ALT 19 10/12/2019   AST 26 10/12/2019   ALKPHOS 69 10/12/2019   BILITOT 0.3 10/12/2019   Lab Results  Component Value Date   HGBA1C 4.6 (L) 10/12/2019   HGBA1C 4.8 05/04/2019   HGBA1C 5.1 11/03/2018   HGBA1C 5.4 08/02/2018   HGBA1C 6.9 (H) 04/27/2018   Lab Results  Component Value Date   INSULIN 3.5 10/12/2019   INSULIN 6.9 08/02/2018   INSULIN 7.0 04/27/2018   Lab Results  Component Value Date   TSH 2.100 05/04/2019   Lab Results  Component Value Date   CHOL 157 10/12/2019   HDL 79 10/12/2019   LDLCALC 65 10/12/2019   TRIG 68 10/12/2019   CHOLHDL 2.7 04/09/2016   Lab Results  Component Value Date  WBC 8.0 06/04/2019   HGB 13.6 06/04/2019   HCT 42.2 06/04/2019   MCV 88.5 06/04/2019   PLT 244 06/04/2019   Lab Results  Component Value Date   IRON 28 11/03/2018   TIBC 335 11/03/2018   FERRITIN 34 11/03/2018    Obesity Behavioral Intervention Documentation for Insurance:   Approximately 15 minutes were spent on the discussion below.  ASK: We discussed the diagnosis of obesity with [name] [name] today and [name] [name] agreed to give [place] permission to discuss obesity behavioral modification therapy  today.  ASSESS: Rether has the diagnosis of obesity and her BMI today is 27.46. Baylen is in the action stage of change.   ADVISE: [name] [name] was educated on the multiple health risks of obesity as well as the benefit of weight loss to improve her health. She was advised of the need for long term treatment and the importance of lifestyle modifications to improve her current health and to decrease her risk of future health problems.  AGREE: Multiple dietary modification options and treatment options were discussed and [name] [name] agreed to follow the recommendations documented in the above note.  ARRANGE: [name] [name] was educated on the importance of frequent visits to treat obesity as outlined per CMS and USPSTF guidelines and agreed to schedule her next follow up appointment today.  Attestation Statements:   Reviewed by clinician on day of visit: allergies, medications, problem list, medical history, surgical history, family history, social history, and previous encounter notes.  Corey Skains, am acting as Location manager for [name], FNP-C.  I have reviewed the above documentation for accuracy and completeness, and I agree with the above. -  [name] Nitta [employer], FNP-C

## 2019-10-26 ENCOUNTER — Encounter (INDEPENDENT_AMBULATORY_CARE_PROVIDER_SITE_OTHER): Payer: Self-pay | Admitting: Family Medicine

## 2019-10-30 ENCOUNTER — Ambulatory Visit (INDEPENDENT_AMBULATORY_CARE_PROVIDER_SITE_OTHER): Payer: Medicare Other | Admitting: *Deleted

## 2019-10-30 DIAGNOSIS — I6340 Cerebral infarction due to embolism of unspecified cerebral artery: Secondary | ICD-10-CM

## 2019-10-30 LAB — CUP PACEART REMOTE DEVICE CHECK
Date Time Interrogation Session: 20210207231435
Implantable Pulse Generator Implant Date: 20170721

## 2019-10-31 NOTE — Progress Notes (Signed)
ILR Remote

## 2019-11-06 ENCOUNTER — Telehealth: Payer: Self-pay | Admitting: Emergency Medicine

## 2019-11-06 NOTE — Telephone Encounter (Signed)
Patient notified LINQ at RRT. Patient wishes to leave her LINQ in due to the fact that she is having some plastic surgery done and will request the LINQ taken out at that time by surgeon. She will contact our office for extraction if surgeon declines to remove loop recorder. Instructed to unplug monitor and a return kit will be sent to return it .

## 2019-11-09 ENCOUNTER — Ambulatory Visit (INDEPENDENT_AMBULATORY_CARE_PROVIDER_SITE_OTHER): Payer: Medicare Other | Admitting: Family Medicine

## 2019-11-16 DIAGNOSIS — L738 Other specified follicular disorders: Secondary | ICD-10-CM | POA: Diagnosis not present

## 2019-11-16 DIAGNOSIS — L821 Other seborrheic keratosis: Secondary | ICD-10-CM | POA: Diagnosis not present

## 2019-11-23 ENCOUNTER — Other Ambulatory Visit: Payer: Self-pay

## 2019-11-23 ENCOUNTER — Ambulatory Visit (INDEPENDENT_AMBULATORY_CARE_PROVIDER_SITE_OTHER): Payer: Medicare Other | Admitting: Family Medicine

## 2019-11-23 ENCOUNTER — Encounter (INDEPENDENT_AMBULATORY_CARE_PROVIDER_SITE_OTHER): Payer: Self-pay | Admitting: Family Medicine

## 2019-11-23 VITALS — BP 121/74 | HR 64 | Temp 97.7°F | Ht 63.0 in | Wt 151.0 lb

## 2019-11-23 DIAGNOSIS — E669 Obesity, unspecified: Secondary | ICD-10-CM

## 2019-11-23 DIAGNOSIS — I10 Essential (primary) hypertension: Secondary | ICD-10-CM

## 2019-11-23 DIAGNOSIS — Z6830 Body mass index (BMI) 30.0-30.9, adult: Secondary | ICD-10-CM

## 2019-11-23 DIAGNOSIS — E119 Type 2 diabetes mellitus without complications: Secondary | ICD-10-CM | POA: Diagnosis not present

## 2019-11-23 MED ORDER — LOSARTAN POTASSIUM-HCTZ 50-12.5 MG PO TABS: 1.0000 | ORAL_TABLET | Freq: Every day | ORAL | 0 refills | Status: DC

## 2019-11-23 NOTE — Progress Notes (Signed)
Chief Complaint:   OBESITY [name] [name] is here to discuss her progress with her obesity treatment plan along with follow-up of her obesity related diagnoses. [name] [name] is on the Category 2 Plan and states she is following her eating plan approximately 75% of the time. [name] [name] states she is doing aerobics, cardio, and machine exercises for 60 minutes 5 times per week.  Today's visit was #: 75 Starting weight: 258 lbs Starting date: 04/27/2018 Today's weight: 151 lbs Today's date: 11/23/2019 Total lbs lost to date: 107 Total lbs lost since last in-office visit: 4  Interim History: [name] [name] reports being off the plan somewhat and gaining weight a few weeks ago. She believes this may have been water gain. She has since lost this weight.  Subjective:   1. Essential hypertension [name] [name]'s blood pressure is well controlled since addition of losartan recently. She is on losartan-HCTZ.  She denies chest pain or shortness of breath. BP Readings from Last 3 Encounters:  11/23/19 121/74  10/25/19 137/80  10/12/19 (!) 153/76     2. Type 2 diabetes mellitus without complication, without long-term current use of insulin ([facility]) [name] [name]'s diabetes is well controlled on metformin. It is essentially in remission at this point.She denies hypoglycemia. Lab Results  Component Value Date   HGBA1C 4.6 (L) 10/12/2019    Assessment/Plan:   1. Essential hypertension [name] [name] is working on healthy weight loss and exercise to improve blood pressure control. We will watch for signs of hypotension as she continues her lifestyle modifications. We will refill losartan-hydrochlorothiazide for 1 month.  - losartan-hydrochlorothiazide (HYZAAR) 50-12.5 MG tablet; Take 1 tablet by mouth daily.  Dispense: 30 tablet; Refill: 0  2. Type 2 diabetes mellitus without complication, without long-term current use of insulin ([facility]) Good blood sugar control is important to decrease the likelihood of diabetic complications such as  nephropathy, neuropathy, limb loss, blindness, coronary artery disease, and death. Intensive lifestyle modification including diet, exercise and weight loss are the first line of treatment for diabetes. [name] [name] agreed to continue metformin, and will continue to monitor.  3. Class 1 obesity with serious comorbidity and body mass index (BMI) of 30.0 to 30.9 in adult, unspecified obesity type [name] [name] is currently in the action stage of change. As such, her goal is to continue with weight loss efforts. She has agreed to the Category 2 Plan + 300 calories.   Exercise goals: [name] [name] is to continue her current exercise regimen as is.  Behavioral modification strategies: decreasing simple carbohydrates and planning for success.  [name] [name] has agreed to follow-up with our clinic in 2 weeks. She was informed of the importance of frequent follow-up visits to maximize her success with intensive lifestyle modifications for her multiple health conditions.   Objective:   Blood pressure 121/74, pulse 64, temperature 97.7 F (36.5 C), temperature source Oral, height _0  (1.6 m), weight 151 lb (68.5 kg), SpO2 98 %. Body mass index is 26.75 kg/m.  General: Cooperative, alert, well developed, in no acute distress. HEENT: Conjunctivae and lids unremarkable. Cardiovascular: Regular rhythm.  Lungs: Normal work of breathing. Neurologic: No focal deficits.   Lab Results  Component Value Date   CREATININE 0.80 10/12/2019   BUN 15 10/12/2019   NA 139 10/12/2019   K 4.2 10/12/2019   CL 100 10/12/2019   CO2 26 10/12/2019   Lab Results  Component Value Date   ALT 19 10/12/2019   AST 26 10/12/2019   ALKPHOS 69 10/12/2019   BILITOT 0.3 10/12/2019  Lab Results  Component Value Date   HGBA1C 4.6 (L) 10/12/2019   HGBA1C 4.8 05/04/2019   HGBA1C 5.1 11/03/2018   HGBA1C 5.4 08/02/2018   HGBA1C 6.9 (H) 04/27/2018   Lab Results  Component Value Date   INSULIN 3.5 10/12/2019   INSULIN 6.9 08/02/2018    INSULIN 7.0 04/27/2018   Lab Results  Component Value Date   TSH 2.100 05/04/2019   Lab Results  Component Value Date   CHOL 157 10/12/2019   HDL 79 10/12/2019   LDLCALC 65 10/12/2019   TRIG 68 10/12/2019   CHOLHDL 2.7 04/09/2016   Lab Results  Component Value Date   WBC 8.0 06/04/2019   HGB 13.6 06/04/2019   HCT 42.2 06/04/2019   MCV 88.5 06/04/2019   PLT 244 06/04/2019   Lab Results  Component Value Date   IRON 28 11/03/2018   TIBC 335 11/03/2018   FERRITIN 34 11/03/2018    Obesity Behavioral Intervention Documentation for Insurance:   Approximately 15 minutes were spent on the discussion below.  ASK: We discussed the diagnosis of obesity with [name] [name] today and Savvy agreed to give [place] permission to discuss obesity behavioral modification therapy today.  ASSESS: [name] [name] has the diagnosis of obesity and her BMI today is 26.76. [name] [name] is in the action stage of change.   ADVISE: [name] [name] was educated on the multiple health risks of obesity as well as the benefit of weight loss to improve her health. She was advised of the need for long term treatment and the importance of lifestyle modifications to improve her current health and to decrease her risk of future health problems.  AGREE: Multiple dietary modification options and treatment options were discussed and [name] [name] agreed to follow the recommendations documented in the above note.  ARRANGE: [name] [name] was educated on the importance of frequent visits to treat obesity as outlined per CMS and USPSTF guidelines and agreed to schedule her next follow up appointment today.  Attestation Statements:   Reviewed by clinician on day of visit: allergies, medications, problem list, medical history, surgical history, family history, social history, and previous encounter notes.   [name], am acting as Location manager for [name], FNP-C.  I have reviewed the above documentation for accuracy and completeness, and I  agree with the above. -  Georgianne Fick, FNP

## 2019-11-27 ENCOUNTER — Encounter (INDEPENDENT_AMBULATORY_CARE_PROVIDER_SITE_OTHER): Payer: Self-pay | Admitting: Family Medicine

## 2019-12-07 ENCOUNTER — Encounter (INDEPENDENT_AMBULATORY_CARE_PROVIDER_SITE_OTHER): Payer: Self-pay | Admitting: Family Medicine

## 2019-12-07 ENCOUNTER — Ambulatory Visit (INDEPENDENT_AMBULATORY_CARE_PROVIDER_SITE_OTHER): Payer: Medicare Other | Admitting: Family Medicine

## 2019-12-07 ENCOUNTER — Other Ambulatory Visit: Payer: Self-pay

## 2019-12-07 VITALS — BP 118/73 | HR 69 | Temp 97.9°F | Ht 63.0 in | Wt 155.0 lb

## 2019-12-07 DIAGNOSIS — I10 Essential (primary) hypertension: Secondary | ICD-10-CM | POA: Diagnosis not present

## 2019-12-07 DIAGNOSIS — Z6830 Body mass index (BMI) 30.0-30.9, adult: Secondary | ICD-10-CM | POA: Diagnosis not present

## 2019-12-07 DIAGNOSIS — E669 Obesity, unspecified: Secondary | ICD-10-CM

## 2019-12-07 DIAGNOSIS — F3289 Other specified depressive episodes: Secondary | ICD-10-CM | POA: Diagnosis not present

## 2019-12-07 NOTE — Progress Notes (Signed)
Chief Complaint:   OBESITY [name] [name] is here to discuss her progress with her obesity treatment plan along with follow-up of her obesity related diagnoses. [name] [name] is on the Category 2 Plan and states she is following her eating plan approximately 50% of the time. [name] [name] states she is doing cardio and the weight machine for 60 minutes 5 times per week.  Today's visit was #: 4 Starting weight: 258 lbs Starting date: 04/27/2018 Today's weight: 155 lbs Today's date: 12/07/2019 Total lbs lost to date: 103 lbs Total lbs lost since last in-office visit: 0  Interim History: [name] [name] has been on the plan sporadically over the past 2 weeks. She reports lacking the motivation to stick to the plan now that she is at goal weight.  Subjective:   1. Essential hypertension Blood pressure is well-controlled on losartan-HCTZ 50-12.5.  She has a history of stroke.  BP Readings from Last 3 Encounters:  12/07/19 118/73  11/23/19 121/74  10/25/19 137/80   2. Other depression, emotional eating Kaitland reports losing motivation to stick to the plan.  We discussed seeing Dr. Mallie Mussel and she may consider this.   Assessment/Plan:   1. Essential hypertension Tyshea agrees to continue losartan-HCTZ.  2. Other depression, emotional eating She will consider seeing Dr. Mallie Mussel in the future.  3. Class 1 obesity with serious comorbidity and body mass index (BMI) of 30.0 to 30.9 in adult, unspecified obesity type [name] [name] is currently in the action stage of change. As such, her goal is to continue with weight loss efforts. She has agreed to the Category 2 Plan.   Exercise goals: As is.  Behavioral modification strategies: decreasing simple carbohydrates.  [name] has agreed to follow-up with our clinic in 2 weeks. She was informed of the importance of frequent follow-up visits to maximize her success with intensive lifestyle modifications for her multiple health conditions.   Objective:   Blood pressure  118/73, pulse 69, temperature 97.9 F (36.6 C), temperature source Oral, height 5' 3" (1.6 m), weight 155 lb (70.3 kg), SpO2 99 %. Body mass index is 27.46 kg/m.  General: Cooperative, alert, well developed, in no acute distress. HEENT: Conjunctivae and lids unremarkable. Cardiovascular: Regular rhythm.  Lungs: Normal work of breathing. Neurologic: No focal deficits.   Lab Results  Component Value Date   CREATININE 0.80 10/12/2019   BUN 15 10/12/2019   NA 139 10/12/2019   K 4.2 10/12/2019   CL 100 10/12/2019   CO2 26 10/12/2019   Lab Results  Component Value Date   ALT 19 10/12/2019   AST 26 10/12/2019   ALKPHOS 69 10/12/2019   BILITOT 0.3 10/12/2019   Lab Results  Component Value Date   HGBA1C 4.6 (L) 10/12/2019   HGBA1C 4.8 05/04/2019   HGBA1C 5.1 11/03/2018   HGBA1C 5.4 08/02/2018   HGBA1C 6.9 (H) 04/27/2018   Lab Results  Component Value Date   INSULIN 3.5 10/12/2019   INSULIN 6.9 08/02/2018   INSULIN 7.0 04/27/2018   Lab Results  Component Value Date   TSH 2.100 05/04/2019   Lab Results  Component Value Date   CHOL 157 10/12/2019   HDL 79 10/12/2019   LDLCALC 65 10/12/2019   TRIG 68 10/12/2019   CHOLHDL 2.7 04/09/2016   Lab Results  Component Value Date   WBC 8.0 06/04/2019   HGB 13.6 06/04/2019   HCT 42.2 06/04/2019   MCV 88.5 06/04/2019   PLT 244 06/04/2019   Lab Results  Component Value Date  IRON 28 11/03/2018   TIBC 335 11/03/2018   FERRITIN 34 11/03/2018    Obesity Behavioral Intervention Documentation for Insurance:   Approximately 15 minutes were spent on the discussion below.  ASK: We discussed the diagnosis of obesity with [name] [name] today and [name] [name] agreed to give [place] permission to discuss obesity behavioral modification therapy today.  ASSESS: [name] has the diagnosis of obesity and her BMI today is 27.6. [name] is in the action stage of change.   ADVISE: [name] [name] was educated on the multiple health risks of obesity as well as  the benefit of weight loss to improve her health. She was advised of the need for long term treatment and the importance of lifestyle modifications to improve her current health and to decrease her risk of future health problems.  AGREE: Multiple dietary modification options and treatment options were discussed and [name] [name] agreed to follow the recommendations documented in the above note.  ARRANGE: [name] [name] was educated on the importance of frequent visits to treat obesity as outlined per CMS and USPSTF guidelines and agreed to schedule her next follow up appointment today.  Attestation Statements:   Reviewed by clinician on day of visit: allergies, medications, problem list, medical history, surgical history, family history, social history, and previous encounter notes.  I, Water quality scientist, CMA, am acting as Location manager for [name], FNP-C.  I have reviewed the above documentation for accuracy and completeness, and I agree with the above. -  Georgianne Fick, FNP

## 2019-12-18 NOTE — Progress Notes (Signed)
ICD-10-CM   1. Encounter for counseling  Z71.9       Patient ID: [name] [name], female    DOB: July 25, 1951, 69 y.o.   MRN: [id number]   History of Present Illness: [name] [name] is a 69 y.o.  female  with a history of significant weight loss .  She presents for preoperative evaluation for upcoming procedure, abdominoplasty, scheduled for 01/08/20 with Dr. Marla Roe.  Patient had bariatric surgery in 2012 and has since lost approximately 150 pounds and kept it off.  She has been involved in exercise and healthy eating.  Since then she is also had hip replacement surgery, spinal surgery, face lift, and brachioplasty. She is wanting to have a mastopexy in the near future.   PMH Significant for: DM, CVA, TIA, HLD, HTN, DVT, B12 deficiency following gastric bypass surgery. Taking Xarelto.  The patient has not had problems with anesthesia.   Past Medical History: Allergies: Allergies  Allergen Reactions  . Ambien [Zolpidem] Other (See Comments)    NIGHT TERRORS  . Atorvastatin Other (See Comments)    Muscle weakness Muscle Aches  . Codeine Nausea And Vomiting  . Penicillins Itching and Other (See Comments)    Made nose run uncontrollably Has patient had a PCN reaction causing immediate rash, facial/tongue/throat swelling, SOB or lightheadedness with hypotension: No Has patient had a PCN reaction causing severe rash involving mucus membranes or skin necrosis: No Has patient had a PCN reaction that required hospitalization No Has patient had a PCN reaction occurring within the last 10 years: No If all of the above answers are "NO", then may proceed with Cephalosporin use.  . Simvastatin Other (See Comments)    Muscle weakness  . Meclizine     Felt like her heart was pounding, "made me crazy", didn't help the dizziness  . Byetta 10 Mcg Pen [Exenatide] Nausea Only    Current Medications:  Current Outpatient Medications:  .  cholecalciferol (VITAMIN D3) 25 MCG (1000 UT)  tablet, Take 5,000 Units by mouth daily., Disp: , Rfl:  .  diazepam (VALIUM) 2 MG tablet, TAKE 1 TABLET BY MOUTH TWICE A DAY AS NEEDED FOR VERTIGO, Disp: , Rfl:  .  ferrous sulfate 325 (65 FE) MG tablet, Take 325 mg by mouth 2 (two) times daily with a meal., Disp: , Rfl:  .  gabapentin (NEURONTIN) 300 MG capsule, Take 2 capsules (600 mg total) by mouth at bedtime., Disp: 180 capsule, Rfl: 4 .  losartan-hydrochlorothiazide (HYZAAR) 50-12.5 MG tablet, Take 1 tablet by mouth daily., Disp: 30 tablet, Rfl: 0 .  magnesium oxide (MAG-OX) 400 MG tablet, Take 400 mg by mouth at bedtime. , Disp: , Rfl:  .  metFORMIN (GLUCOPHAGE) 500 MG tablet, Take 1 tablet (500 mg total) by mouth daily with breakfast., Disp: 90 tablet, Rfl: 0 .  Multiple Vitamins-Minerals (OCUVITE-LUTEIN PO), Take 1 tablet by mouth daily., Disp: , Rfl:  .  OVER THE COUNTER MEDICATION, P5P vitamin, Disp: , Rfl:  .  rosuvastatin (CRESTOR) 20 MG tablet, Take 1 tablet (20 mg total) by mouth daily. (Patient taking differently: Take 20 mg by mouth at bedtime. ), Disp: 30 tablet, Rfl: 0 .  XARELTO 20 MG TABS tablet, Take 20 mg by mouth daily., Disp: , Rfl:   Past Medical Problems: Past Medical History:  Diagnosis Date  . Anemia   . Arthritis    "knees, hips, possibly back" (04/08/2016)  . Atrial septal aneurysm   . B12 deficiency  a. following gastric bypass.  . Back pain   . Chronic lower back pain    spondylosis  . Cold feet   . Daily headache    "short ones for the last month or 2" (04/08/2016)  . Depression   . Diabetes ([name])   . Dry mouth   . DVT (deep venous thrombosis) ([place])   . Easy bruising   . Floaters in visual field   . GERD (gastroesophageal reflux disease)    takes Protonix daily "just to protect my stomach; not for reflux" (04/08/2016)  . Hay fever   . Heart murmur    "dx'd by 1 anesthesiologist years and years ago"  . History of loop recorder 03/2016  . Hyperlipidemia   . Hypertension   . Joint pain   . Leg  cramp   . Macular degeneration    "just watching"  . Neuropathy   . Nosebleed   . Palpitations   . Peripheral edema   . Peripheral neuropathy    not on any meds  . 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  [name] Pneumonia 12/2017  . Sleep apnea    no CPAP since weight loss after bariatric surgery '12 (04/08/2016)  . Spondylosis   . Stroke ([place]) 04/07/2016   a. 01/2016 and 03/2016. during admission had + LE DVT.  [name] Swelling of extremity   . TIA (transient ischemic attack)   . Trouble in sleeping   . Urinary incontinence   . Weakness     Past Surgical History: Past Surgical History:  Procedure Laterality Date  . CATARACT EXTRACTION W/ INTRAOCULAR LENS  IMPLANT, BILATERAL Bilateral ~ 2012  . COLONOSCOPY    . EP IMPLANTABLE DEVICE N/A 04/10/2016   Procedure: Loop Recorder Insertion;  Surgeon: Will Meredith Leeds, MD;  Location: [facility];  Service: Cardiovascular;  Laterality: N/A;  . ESOPHAGOGASTRODUODENOSCOPY     in the 70's  . FACIAL COSMETIC SURGERY    . JOINT REPLACEMENT    . 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 SINUS ENDO W/FUSION  02/28/2016   Procedure: ENDOSCOPIC SINUS SURGERY WITH NAVIGATION;  Surgeon: Ruby Cola, MD;  Location: [facility];  Service: ENT;;  . TEE WITHOUT CARDIOVERSION N/A 04/10/2016   Procedure: TRANSESOPHAGEAL ECHOCARDIOGRAM (TEE) POSSIBLE LOOP;  Surgeon: Sanda Klein, MD;  Location: [facility];  Service: Cardiovascular;  Laterality: N/A;  . TONSILLECTOMY  1957  . TOTAL HIP ARTHROPLASTY Right 2008  . TOTAL HIP REVISION Right 03/30/2016   Procedure: RIGHT TOTAL HIP REVISION;  Surgeon: Frederik Pear, MD;  Location: [facility];  Service: Orthopedics;  Laterality: Right;  . TOTAL KNEE ARTHROPLASTY Bilateral 2001  . VAGINAL HYSTERECTOMY     "left my ovaries"    Social History: Social History   Socioeconomic History  . Marital status: Single    Spouse name: Not on file  . Number of children: 0  . Years of education: college  . Highest  education level: Not on file  Occupational History  . Occupation: Retired  Tobacco Use  . Smoking status: Former Smoker    Packs/day: 1.00    Years: 40.00    Pack years: 40.00    Types: Cigarettes    Quit date: 08/2006    Years since quitting: 13.3  . Smokeless tobacco: Never Used  Substance and Sexual Activity  . Alcohol use: Yes    Alcohol/week: 2.0 standard drinks    Types: 2 Glasses of wine per week  Comment: 07/06/2019 maybe 2 glasses of wine/week; 04/08/2016 "5-6 drinks/year'  . Drug use: No    Types: Marijuana    Comment: 04/08/2016 "did some marijuana in college"  . Sexual activity: Never    Birth control/protection: Surgical  Other Topics Concern  . Not on file  Social History Narrative   Lives at home alone   Right-handed   Drinks 4 cups of coffee a day    Social Determinants of Health   Financial Resource Strain:   . Difficulty of Paying Living Expenses:   Food Insecurity:   . Worried About Charity fundraiser in the Last Year:   . Arboriculturist in the Last Year:   Transportation Needs:   . Film/video editor (Medical):   [name] Lack of Transportation (Non-Medical):   Physical Activity:   . Days of Exercise per Week:   . Minutes of Exercise per Session:   Stress:   . Feeling of Stress :   Social Connections:   . Frequency of Communication with Friends and Family:   . Frequency of Social Gatherings with Friends and Family:   . Attends Religious Services:   . Active Member of Clubs or Organizations:   . Attends Archivist Meetings:   [name] Marital Status:   Intimate Partner Violence:   . Fear of Current or Ex-Partner:   . Emotionally Abused:   [name] Physically Abused:   . Sexually Abused:     Family History: Family History  Problem Relation Age of Onset  . Neuropathy Mother   . Hypertension Mother   . Hyperlipidemia Mother   . Thyroid disease Mother   . Prostate cancer Father   . Lymphoma Father   . Hypertension Father   . Hyperlipidemia  Father   . Cancer Father   . Obesity Father   . Congestive Heart Failure Maternal Grandmother   . Lung cancer Maternal Grandfather     Review of Systems: ROS  Physical Exam: Vital Signs BP (!) 150/78 (BP Location: Right Arm, Patient Position: Sitting, Cuff Size: Normal)   Pulse 67   Temp (!) 96.6 F (35.9 C) (Temporal)   Ht _0  (1.6 m)   Wt 157 lb (71.2 kg)   SpO2 100%   BMI 27.81 kg/m  Physical Exam  Assessment/Plan:  Ms. Feimster scheduled for abdominoplasty with Dr. Marla Roe.  Risks, benefits, and alternatives of procedure discussed, questions answered and consent obtained.    Smoking Status: Non-smoker - quit Dec 2007 Cardiac/Medical/Pharmacy clearance request sent to Dr. Radene Ou. Will need to stop Xarelto prior to surgery  Caprini Score: 8 High; Risk Factors include: 69 yr-old female, hx DVT, BMI > 25, and length of planned surgery. Recommendation for mechanical and pharmacological prophylaxis during surgery. Encourage early ambulation.   Pictures obtained: 12/19/19  Post-op Rx sent to pharmacy: Norco, Zofran, Cipro  Patient was provided with general surgical risk consent prior to their appointment.  They had adequate time to read through the consent forms and we also discussed them in person together during this preop appointment.  All of their questions were answered to their content.  Recommended calling if they have any further questions.  Consent form to be scanned into patient's chart.  The risk that can be encountered for this procedure were discussed and include the following but not limited to these: asymmetry, fluid accumulation, firmness of the tissue, skin loss, decrease or no sensation, fat necrosis, bleeding, infection, healing delay.  Deep vein thrombosis, cardiac and  pulmonary complications are risks to any procedure.  There are risks of anesthesia, changes to skin sensation and injury to nerves or blood vessels.  The muscle can be temporarily or  permanently injured.  You may have an allergic reaction to tape, suture, glue, blood products which can result in skin discoloration, swelling, pain, skin lesions, poor healing.  Any of these can lead to the need for revisonal surgery or stage procedures.  Weight gain and weigh loss can also effect the long term appearance. The results are not guaranteed to last a lifetime.  Future surgery may be required.    The [place] was signed into law in 2016 which includes the topic of electronic health records.  This provides immediate access to information in MyChart.  This includes consultation notes, operative notes, office notes, lab results and pathology reports.  If you have any questions about what you read please let us know at your next visit or call us at the office.  We are right here with you.    Electronically signed by: Threasa Heads, PA-C 12/19/2019 9:35 AM

## 2019-12-19 ENCOUNTER — Ambulatory Visit (INDEPENDENT_AMBULATORY_CARE_PROVIDER_SITE_OTHER): Payer: Medicare Other | Admitting: Plastic Surgery

## 2019-12-19 ENCOUNTER — Encounter: Payer: Self-pay | Admitting: Plastic Surgery

## 2019-12-19 ENCOUNTER — Other Ambulatory Visit: Payer: Self-pay

## 2019-12-19 VITALS — BP 150/78 | HR 67 | Temp 96.6°F | Ht 63.0 in | Wt 157.0 lb

## 2019-12-19 DIAGNOSIS — Z719 Counseling, unspecified: Secondary | ICD-10-CM

## 2019-12-19 MED ORDER — HYDROCODONE-ACETAMINOPHEN 5-325 MG PO TABS: 1.0000 | ORAL_TABLET | Freq: Three times a day (TID) | ORAL | 0 refills | Status: AC | PRN

## 2019-12-19 MED ORDER — ONDANSETRON HCL 4 MG PO TABS: 4.0000 mg | ORAL_TABLET | Freq: Three times a day (TID) | ORAL | 0 refills | Status: DC | PRN

## 2019-12-19 MED ORDER — CIPROFLOXACIN HCL 500 MG PO TABS: 500.0000 mg | ORAL_TABLET | Freq: Two times a day (BID) | ORAL | 0 refills | Status: AC

## 2019-12-20 ENCOUNTER — Other Ambulatory Visit: Payer: Self-pay

## 2019-12-20 ENCOUNTER — Ambulatory Visit (INDEPENDENT_AMBULATORY_CARE_PROVIDER_SITE_OTHER): Payer: Medicare Other | Admitting: Family Medicine

## 2019-12-20 ENCOUNTER — Encounter (INDEPENDENT_AMBULATORY_CARE_PROVIDER_SITE_OTHER): Payer: Self-pay | Admitting: Family Medicine

## 2019-12-20 VITALS — BP 125/66 | HR 67 | Temp 97.6°F | Ht 63.0 in | Wt 153.0 lb

## 2019-12-20 DIAGNOSIS — I152 Hypertension secondary to endocrine disorders: Secondary | ICD-10-CM

## 2019-12-20 DIAGNOSIS — E669 Obesity, unspecified: Secondary | ICD-10-CM

## 2019-12-20 DIAGNOSIS — E1159 Type 2 diabetes mellitus with other circulatory complications: Secondary | ICD-10-CM

## 2019-12-20 DIAGNOSIS — E1169 Type 2 diabetes mellitus with other specified complication: Secondary | ICD-10-CM

## 2019-12-20 DIAGNOSIS — Z6830 Body mass index (BMI) 30.0-30.9, adult: Secondary | ICD-10-CM | POA: Diagnosis not present

## 2019-12-20 DIAGNOSIS — I10 Essential (primary) hypertension: Secondary | ICD-10-CM

## 2019-12-20 MED ORDER — LOSARTAN POTASSIUM 50 MG PO TABS: 50.0000 mg | ORAL_TABLET | Freq: Every day | ORAL | 0 refills | Status: DC

## 2019-12-20 MED ORDER — METFORMIN HCL 500 MG PO TABS: 500.0000 mg | ORAL_TABLET | Freq: Every day | ORAL | 0 refills | Status: DC

## 2019-12-20 MED ORDER — HYDROCHLOROTHIAZIDE 25 MG PO TABS: 25.0000 mg | ORAL_TABLET | Freq: Every day | ORAL | 0 refills | Status: DC

## 2019-12-20 NOTE — Progress Notes (Signed)
Chief Complaint:   OBESITY [name] [name] is here to discuss her progress with her obesity treatment plan along with follow-up of her obesity related diagnoses. [name] is on the Category 2 Plan and states she is following her eating plan approximately 85% of the time. [name] [name] states she is doing cardio and the weight machine for 30-60 minutes 5 times per week.  Today's visit was #: 60 Starting weight: 258 lbs Starting date: 04/27/2018 Today's weight: 153 lbs Today's date: 12/20/2019 Total lbs lost to date: 105  Total lbs lost since last in-office visit: 2  Interim History: [name] has done well on the plan over the past 2 weeks. She does occasionally have wine and goes over on extra calories.  Subjective:   1. Type 2 diabetes mellitus without complication, without long-term current use of insulin ([place]) [name]'s diabetes mellitus is well controlled on metformin.  2. Essential hypertension [name] notes swelling in abdominal tissue and ankles. Her blood pressure is well controlled.  Assessment/Plan:   1. Type 2 diabetes mellitus without complication, without long-term current use of insulin (H[facility]) Good blood sugar control is important to decrease the likelihood of diabetic complications such as nephropathy, neuropathy, limb loss, blindness, coronary artery disease, and death. Intensive lifestyle modification including diet, exercise and weight loss are the first line of treatment for diabetes. We will refill metformin for 90 days with no refills.  - metFORMIN (GLUCOPHAGE) 500 MG tablet; Take 1 tablet (500 mg total) by mouth daily with breakfast.  Dispense: 90 tablet; Refill: 0  2. Essential hypertension [name] is working on healthy weight loss and exercise to improve blood pressure control. We will watch for signs of hypotension as she continues her lifestyle modifications. Jet agreed to discontinue Hyzaar, and we will refill losartan and hydrochlorothiazide for 90 days with no refills.  -  losartan (COZAAR) 50 MG tablet; Take 1 tablet (50 mg total) by mouth daily.  Dispense: 90 tablet; Refill: 0 - hydrochlorothiazide (HYDRODIURIL) 25 MG tablet; Take 1 tablet (25 mg total) by mouth daily.  Dispense: 90 tablet; Refill: 0  3. Class 1 obesity with serious comorbidity and body mass index (BMI) of 30.0 to 30.9 in adult, unspecified obesity type [name] [name] is currently in the action stage of change. As such, her goal is to continue with weight loss efforts. She has agreed to the Category 2 Plan.   Exercise goals: As is.  Behavioral modification strategies: decreasing simple carbohydrates and planning for success.  [name] [name] has agreed to follow-up with our clinic in 4 weeks. She was informed of the importance of frequent follow-up visits to maximize her success with intensive lifestyle modifications for her multiple health conditions.   Objective:   Blood pressure 125/66, pulse 67, temperature 97.6 F (36.4 C), temperature source Oral, height _0  (1.6 m), weight 153 lb (69.4 kg), SpO2 98 %. Body mass index is 27.1 kg/m.  General: Cooperative, alert, well developed, in no acute distress. HEENT: Conjunctivae and lids unremarkable. Cardiovascular: Regular rhythm.  Lungs: Normal work of breathing. Neurologic: No focal deficits.   Lab Results  Component Value Date   CREATININE 0.80 10/12/2019   BUN 15 10/12/2019   NA 139 10/12/2019   K 4.2 10/12/2019   CL 100 10/12/2019   CO2 26 10/12/2019   Lab Results  Component Value Date   ALT 19 10/12/2019   AST 26 10/12/2019   ALKPHOS 69 10/12/2019   BILITOT 0.3 10/12/2019   Lab Results  Component Value Date  HGBA1C 4.6 (L) 10/12/2019   HGBA1C 4.8 05/04/2019   HGBA1C 5.1 11/03/2018   HGBA1C 5.4 08/02/2018   HGBA1C 6.9 (H) 04/27/2018   Lab Results  Component Value Date   INSULIN 3.5 10/12/2019   INSULIN 6.9 08/02/2018   INSULIN 7.0 04/27/2018   Lab Results  Component Value Date   TSH 2.100 05/04/2019   Lab Results    Component Value Date   CHOL 157 10/12/2019   HDL 79 10/12/2019   LDLCALC 65 10/12/2019   TRIG 68 10/12/2019   CHOLHDL 2.7 04/09/2016   Lab Results  Component Value Date   WBC 8.0 06/04/2019   HGB 13.6 06/04/2019   HCT 42.2 06/04/2019   MCV 88.5 06/04/2019   PLT 244 06/04/2019   Lab Results  Component Value Date   IRON 28 11/03/2018   TIBC 335 11/03/2018   FERRITIN 34 11/03/2018    Obesity Behavioral Intervention Documentation for Insurance:   Approximately 15 minutes were spent on the discussion below.  ASK: We discussed the diagnosis of obesity with [name] [name] today and [name] [name] agreed to give [place] permission to discuss obesity behavioral modification therapy today.  ASSESS: Maleeka has the diagnosis of obesity and her BMI today is 27.11. [name] [name] is in the action stage of change.   ADVISE: [name] [name] was educated on the multiple health risks of obesity as well as the benefit of weight loss to improve her health. She was advised of the need for long term treatment and the importance of lifestyle modifications to improve her current health and to decrease her risk of future health problems.  AGREE: Multiple dietary modification options and treatment options were discussed and [name] [name] agreed to follow the recommendations documented in the above note.  ARRANGE: [name] [name] was educated on the importance of frequent visits to treat obesity as outlined per CMS and USPSTF guidelines and agreed to schedule her next follow up appointment today.  Attestation Statements:   Reviewed by clinician on day of visit: allergies, medications, problem list, medical history, surgical history, family history, social history, and previous encounter notes.   [name], am acting as Location manager for [name], FNP-C.  I have reviewed the above documentation for accuracy and completeness, and I agree with the above. - Georgianne Fick, FNP

## 2019-12-21 ENCOUNTER — Telehealth: Payer: Self-pay | Admitting: *Deleted

## 2019-12-21 ENCOUNTER — Encounter: Payer: Self-pay | Admitting: Plastic Surgery

## 2019-12-21 NOTE — Telephone Encounter (Signed)
   Primary Cardiologist:No primary care provider on file. was seen by Melina Copa, PA in 2018 and by Dr. Curt Bears for loop recorder.    Has not been seen since 2018.    Chart reviewed as part of pre-operative protocol coverage. Because of [name] [name] past medical history and time since last visit, he/she will require a follow-up visit in order to better assess preoperative cardiovascular risk.  Pre-op covering staff: - Please schedule appointment and call patient to inform them. - Please contact requesting surgeon's office via preferred method (i.e, phone, fax) to inform them of need for appointment prior to surgery.  If applicable, this message will also be routed to pharmacy pool and/or primary cardiologist for input on holding anticoagulant/antiplatelet agent as requested below so that this information is available at time of patient's appointment.   Cecilie Kicks, NP  12/21/2019, 11:07 AM

## 2019-12-21 NOTE — Telephone Encounter (Signed)
Message has been sent to our scheduling team to reach to pt with a New Pt appt. Pt last seen 09/2016.

## 2019-12-21 NOTE — Progress Notes (Signed)
Surgical/medical clearance faxed to [facility]/cardiology- per Phoebe Sharps, PA Surgery scheduled for 01/08/20 Scanned into chart & to [facility]

## 2019-12-21 NOTE — Telephone Encounter (Signed)
[facility] Pre-operative Risk Assessment    Request for surgical clearance:  1. What type of surgery is being performed? ABDOMINOPLASTY   2. When is this surgery scheduled? 01/08/20   3. What type of clearance is required (medical clearance vs. Pharmacy clearance to hold med vs. Both)? BOTH  4. Are there any medications that need to be held prior to surgery and how long? [place]   5. Practice name and name of physician performing surgery? [facility]; DR. Royce Macadamia   6. What is your office phone number [phone]    7.   What is your office fax number [phone]  8.   Anesthesia type (None, local, MAC, general) ? GENERAL   [name] [name] 12/21/2019, 10:10 AM  _________________________________________________________________   (provider comments below)

## 2019-12-22 NOTE — Telephone Encounter (Signed)
Reached out to patient to schedule appointment for pre-op clearance. Patient advised that Dr. Zadie Rhine will be doing her clearance. She states that she will be out of town 4/6 - 4/15. I was going to offer her 4/9 but she will be gone. I also advised her that the clearance was sent to [place] more than likely because her loop recorder but she states that it doesn't work because she was told that the battery was dead. I did advise that even if I could schedule her for 4/16(which I did not have anything available) it did not mean that she would be cleared because the provider made need her to have additional test. She said well hopefully Dr. Zadie Rhine will clear. A referral was entered in the system that I will be closing since patient declined appt.

## 2019-12-22 NOTE — Telephone Encounter (Signed)
We have not seen pt since 2018  She has HTN but no CAD, she has had CVA. Pt tells [place] that Dr. Zadie Rhine will clear her.  Please call if we can be or further assistance.    Cecilie Kicks, NP-C    CV Preop Callback I will fax above but please let Dr. Marliss Czar office know verbally as well. Thanks.

## 2019-12-22 NOTE — Telephone Encounter (Signed)
Is the request for pre-op clearance not good enough for referral?  Sent by surgeon?

## 2019-12-22 NOTE — Telephone Encounter (Signed)
Avelina Laine E sent to Michae Kava, CMA  Good Morning Arbie Cookey,   She last saw Melina Copa for OV on 09/24/2016 and will need a new appointment with general cardiology for pre op clearance. I spoke with the patient and made her aware that the office will need a new referral in order to schedule her for pre op clearance.

## 2019-12-25 DIAGNOSIS — D352 Benign neoplasm of pituitary gland: Secondary | ICD-10-CM | POA: Diagnosis not present

## 2019-12-25 DIAGNOSIS — E7800 Pure hypercholesterolemia, unspecified: Secondary | ICD-10-CM | POA: Diagnosis not present

## 2019-12-25 DIAGNOSIS — Z9884 Bariatric surgery status: Secondary | ICD-10-CM | POA: Diagnosis not present

## 2019-12-25 DIAGNOSIS — Z8673 Personal history of transient ischemic attack (TIA), and cerebral infarction without residual deficits: Secondary | ICD-10-CM | POA: Diagnosis not present

## 2019-12-25 DIAGNOSIS — Z01818 Encounter for other preprocedural examination: Secondary | ICD-10-CM | POA: Diagnosis not present

## 2019-12-25 DIAGNOSIS — L987 Excessive and redundant skin and subcutaneous tissue: Secondary | ICD-10-CM | POA: Diagnosis not present

## 2019-12-25 DIAGNOSIS — E538 Deficiency of other specified B group vitamins: Secondary | ICD-10-CM | POA: Diagnosis not present

## 2019-12-25 DIAGNOSIS — I10 Essential (primary) hypertension: Secondary | ICD-10-CM | POA: Diagnosis not present

## 2019-12-25 DIAGNOSIS — G629 Polyneuropathy, unspecified: Secondary | ICD-10-CM | POA: Diagnosis not present

## 2019-12-25 DIAGNOSIS — Z86718 Personal history of other venous thrombosis and embolism: Secondary | ICD-10-CM | POA: Diagnosis not present

## 2019-12-25 DIAGNOSIS — Z7901 Long term (current) use of anticoagulants: Secondary | ICD-10-CM | POA: Diagnosis not present

## 2019-12-25 DIAGNOSIS — I253 Aneurysm of heart: Secondary | ICD-10-CM | POA: Diagnosis not present

## 2019-12-27 ENCOUNTER — Encounter: Payer: Self-pay | Admitting: *Deleted

## 2019-12-27 NOTE — Progress Notes (Signed)
Received surgery clearance request from Dr. Eusebio Friendly office. Ok per Dr. Jaynee Eagles. Letter written & faxed to Dr. Eusebio Friendly office @ [phone]. Received a receipt of confirmation.

## 2019-12-28 ENCOUNTER — Telehealth: Payer: Self-pay

## 2019-12-28 NOTE — Telephone Encounter (Signed)
Surgical clearance obtained from Dr. Kallie Edward his request pt is cleared for surgery on 01/08/20 He does approve for pt to stop Xarelto  For 5 days prior to surgery & can resume RX 5 days postop Clearance has been forwarded to [facility]- surgery scheduler & to Dr. Garner Gavel, PA Report also scanned into chart

## 2020-01-01 ENCOUNTER — Encounter: Payer: Self-pay | Admitting: Plastic Surgery

## 2020-01-02 ENCOUNTER — Telehealth: Payer: Self-pay | Admitting: Nurse Practitioner

## 2020-01-02 NOTE — Telephone Encounter (Signed)
Received call directly from Dr. Radene Ou for Dr. Acie Fredrickson. She is calling about her mutual primary care pt with a question about the patient's cardiology history. She received a surgical clearance request for this patient and states she is not comfortable signing off due to patient's hx of atrial septal aneurysm. She saw Dr. [name] Shiley name mentioned on the echo report and thought that he was patient's primary cardiologist. I advised that patient's most recent visit with [facility] was with Melina Copa, PA and Dr. Curt Bears is listed as cardiologist. I explained our system of having openings on DOD schedule daily and offered to schedule the patient for next available appointment. I scheduled patient to see Dr. Angelena Form on 4/14 for clearance. Dr. Radene Ou called back shortly after we hung up and advised that patient will be out of town until the evening of 4/14. We rescheduled patient's appointment to 4/15 with Dr. Curt Bears, who is DOD that day. Dr. Radene Ou thanked me for my help.

## 2020-01-03 ENCOUNTER — Ambulatory Visit: Payer: Medicare Other | Admitting: Cardiovascular Disease

## 2020-01-04 ENCOUNTER — Ambulatory Visit (INDEPENDENT_AMBULATORY_CARE_PROVIDER_SITE_OTHER): Payer: Medicare Other | Admitting: Cardiology

## 2020-01-04 ENCOUNTER — Other Ambulatory Visit: Payer: Self-pay

## 2020-01-04 ENCOUNTER — Telehealth: Payer: Self-pay | Admitting: Plastic Surgery

## 2020-01-04 ENCOUNTER — Telehealth: Payer: Self-pay | Admitting: Cardiology

## 2020-01-04 ENCOUNTER — Encounter: Payer: Self-pay | Admitting: Cardiology

## 2020-01-04 VITALS — BP 120/70 | HR 67 | Ht 63.0 in | Wt 164.0 lb

## 2020-01-04 DIAGNOSIS — I6340 Cerebral infarction due to embolism of unspecified cerebral artery: Secondary | ICD-10-CM

## 2020-01-04 DIAGNOSIS — Z01810 Encounter for preprocedural cardiovascular examination: Secondary | ICD-10-CM | POA: Diagnosis not present

## 2020-01-04 NOTE — Progress Notes (Signed)
Electrophysiology Office Note   Date:  01/04/2020   ID:  [name] [name], DOB 08-14-1951, MRN [id number]  PCP:  [name], MD  Cardiologist:   Primary Electrophysiologist:  Kiarra Kidd Meredith Leeds, MD    Chief Complaint: preop   History of Present Illness: [name] [name] is a 69 y.o. female who is being seen today for the evaluation of preop at the request of Rankins, Bill Salinas, MD. Presenting today for electrophysiology evaluation.  She has a history of TIA and is now status post Linq monitor.  She also has diabetes, hypertension, hyperlipidemia.  She has plans for surgery for 1921.  Surgery is planned abdominoplasty.  She presents for a preoperative clearance.  She had an echo in 2018 that showed a normal ejection fraction.  TEE in 2017 showed an atrial septal aneurysm, but no other abnormalities.  She also has grade 2 diastolic dysfunction.  Today, she denies symptoms of palpitations, chest pain, shortness of breath, orthopnea, PND, lower extremity edema, claudication, dizziness, presyncope, syncope, bleeding, or neurologic sequela. The patient is tolerating medications without difficulties.  She exercises for approximately an hour and a half a day.  She does 30 minutes of cardio and the rest is either weights with either upper or lower body.  She is lost 170 pounds, 107 this year.  She has no chest pain or shortness of breath.  She is able to do all of her activities without restriction.   Past Medical History:  Diagnosis Date  . Anemia   . Arthritis    "knees, hips, possibly back" (04/08/2016)  . Atrial septal aneurysm   . B12 deficiency    a. following gastric bypass.  . Back pain   . Chronic lower back pain    spondylosis  . Cold feet   . Daily headache    "short ones for the last month or 2" (04/08/2016)  . Depression   . Diabetes ([place])   . Dry mouth   . DVT (deep venous thrombosis) ([place])   . Easy bruising   . Floaters in visual field   . GERD  (gastroesophageal reflux disease)    takes Protonix daily "just to protect my stomach; not for reflux" (04/08/2016)  . Hay fever   . Heart murmur    "dx'd by 1 anesthesiologist years and years ago"  . History of loop recorder 03/2016  . Hyperlipidemia   . Hypertension   . Joint pain   . Leg cramp   . Macular degeneration    "just watching"  . Neuropathy   . Nosebleed   . Palpitations   . Peripheral edema   . Peripheral neuropathy    not on any meds  . 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  [name] Pneumonia 12/2017  . Sleep apnea    no CPAP since weight loss after bariatric surgery '12 (04/08/2016)  . Spondylosis   . Stroke ([place]) 04/07/2016   a. 01/2016 and 03/2016. during admission had + LE DVT.  [name] Swelling of extremity   . TIA (transient ischemic attack)   . Trouble in sleeping   . Urinary incontinence   . Weakness    Past Surgical History:  Procedure Laterality Date  . CATARACT EXTRACTION W/ INTRAOCULAR LENS  IMPLANT, BILATERAL Bilateral ~ 2012  . COLONOSCOPY    . EP IMPLANTABLE DEVICE N/A 04/10/2016   Procedure: Loop Recorder Insertion;  Surgeon: Annamary Buschman Meredith Leeds, MD;  Location: [facility];  Service: Cardiovascular;  Laterality: N/A;  . ESOPHAGOGASTRODUODENOSCOPY     in the 70's  . FACIAL COSMETIC SURGERY    . JOINT REPLACEMENT    . 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 SINUS ENDO W/FUSION  02/28/2016   Procedure: ENDOSCOPIC SINUS SURGERY WITH NAVIGATION;  Surgeon: Ruby Cola, MD;  Location: [facility];  Service: ENT;;  . TEE WITHOUT CARDIOVERSION N/A 04/10/2016   Procedure: TRANSESOPHAGEAL ECHOCARDIOGRAM (TEE) POSSIBLE LOOP;  Surgeon: Sanda Klein, MD;  Location: [facility];  Service: Cardiovascular;  Laterality: N/A;  . TONSILLECTOMY  1957  . TOTAL HIP ARTHROPLASTY Right 2008  . TOTAL HIP REVISION Right 03/30/2016   Procedure: RIGHT TOTAL HIP REVISION;  Surgeon: Frederik Pear, MD;  Location: [facility];  Service: Orthopedics;  Laterality: Right;  .  TOTAL KNEE ARTHROPLASTY Bilateral 2001  . VAGINAL HYSTERECTOMY     "left my ovaries"     Current Outpatient Medications  Medication Sig Dispense Refill  . cholecalciferol (VITAMIN D3) 25 MCG (1000 UT) tablet Take 5,000 Units by mouth daily.    . diazepam (VALIUM) 2 MG tablet TAKE 1 TABLET BY MOUTH TWICE A DAY AS NEEDED FOR VERTIGO    . ferrous sulfate 325 (65 FE) MG tablet Take 325 mg by mouth 2 (two) times daily with a meal.    . gabapentin (NEURONTIN) 300 MG capsule Take 2 capsules (600 mg total) by mouth at bedtime. 180 capsule 4  . hydrochlorothiazide (HYDRODIURIL) 25 MG tablet Take 1 tablet (25 mg total) by mouth daily. 90 tablet 0  . losartan (COZAAR) 50 MG tablet Take 1 tablet (50 mg total) by mouth daily. 90 tablet 0  . magnesium oxide (MAG-OX) 400 MG tablet Take 400 mg by mouth at bedtime.     . metFORMIN (GLUCOPHAGE) 500 MG tablet Take 1 tablet (500 mg total) by mouth daily with breakfast. 90 tablet 0  . Multiple Vitamins-Minerals (OCUVITE-LUTEIN PO) Take 1 tablet by mouth daily.    . ondansetron (ZOFRAN) 4 MG tablet Take 1 tablet (4 mg total) by mouth every 8 (eight) hours as needed for nausea or vomiting. 20 tablet 0  . OVER THE COUNTER MEDICATION P5P vitamin    . rosuvastatin (CRESTOR) 20 MG tablet Take 1 tablet (20 mg total) by mouth daily. (Patient taking differently: Take 20 mg by mouth at bedtime. ) 30 tablet 0  . XARELTO 20 MG TABS tablet Take 20 mg by mouth daily.     No current facility-administered medications for this visit.    Allergies:   Ambien [zolpidem], Atorvastatin, Codeine, Penicillins, Simvastatin, Meclizine, and Byetta 10 mcg pen [exenatide]   Social History:  The patient  reports that she quit smoking about 13 years ago. Her smoking use included cigarettes. She has a 40.00 pack-year smoking history. She has never used smokeless tobacco. She reports current alcohol use of about 2.0 standard drinks of alcohol per week. She reports that she does not use drugs.    Family History:  The patient's family history includes Cancer in her father; Congestive Heart Failure in her maternal grandmother; Hyperlipidemia in her father and mother; Hypertension in her father and mother; Lung cancer in her maternal grandfather; Lymphoma in her father; Neuropathy in her mother; Obesity in her father; Prostate cancer in her father; Thyroid disease in her mother.    ROS:  Please see the history of present illness.   Otherwise, review of systems is positive for none.   All other systems are reviewed and negative.  PHYSICAL EXAM: VS:  BP 120/70   Pulse 67   Ht 5' 3" (1.6 m)   Wt 164 lb (74.4 kg)   SpO2 96%   BMI 29.05 kg/m  , BMI Body mass index is 29.05 kg/m. GEN: Well nourished, well developed, in no acute distress  HEENT: normal  Neck: no JVD, carotid bruits, or masses Cardiac: RRR; 2 out of 6 systolic murmur at the base, no rubs, or gallops,no edema  Respiratory:  clear to auscultation bilaterally, normal work of breathing GI: soft, nontender, nondistended, + BS MS: no deformity or atrophy  Skin: warm and dry Neuro:  Strength and sensation are intact Psych: euthymic mood, full affect  EKG:  EKG is ordered today. Personal review of the ekg ordered shows sinus rhythm, rate 67   Recent Labs: 05/04/2019: Magnesium 2.1; TSH 2.100 06/04/2019: Hemoglobin 13.6; Platelets 244 10/12/2019: ALT 19; BUN 15; Creatinine, Ser 0.80; Potassium 4.2; Sodium 139    Lipid Panel     Component Value Date/Time   CHOL 157 10/12/2019 1424   TRIG 68 10/12/2019 1424   HDL 79 10/12/2019 1424   CHOLHDL 2.7 04/09/2016 0155   VLDL 27 04/09/2016 0155   LDLCALC 65 10/12/2019 1424     Wt Readings from Last 3 Encounters:  01/04/20 164 lb (74.4 kg)  12/20/19 153 lb (69.4 kg)  12/19/19 157 lb (71.2 kg)      Other studies Reviewed: Additional studies/ records that were reviewed today include: TTE 2018 Review of the above records today demonstrates:  - Left ventricle: The  cavity size was normal. Wall thickness was  increased in a pattern of mild LVH. Systolic function was normal.  The estimated ejection fraction was in the range of 55% to 60%.  Wall motion was normal; there were no regional wall motion  abnormalities. Features are consistent with a pseudonormal left  ventricular filling pattern, with concomitant abnormal relaxation  and increased filling pressure (grade 2 diastolic dysfunction).    ASSESSMENT AND PLAN:  1.  TIA: Status post Linq monitor.  No obvious atrial fibrillation.  We Hurley Sobel continue to monitor.  2.  Preoperative evaluation: Patient has no chest pain or shortness of breath.  She had an echo in 2018 that showed no major abnormalities.  TEE in 2017 showed an atrial septal aneurysm, but this should not affect her cardiac risk.  At this point, she is at low to intermediate risk for an intermediate risk procedure.  She does have a systolic murmur, but her echo in 2018 showed no valvular abnormalities.  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  No further cardiac testing is necessary.  Asked with her plastic surgeon  Current medicines are reviewed at length with the patient today.   The patient does not have concerns regarding her medicines.  The following changes were made today:  none  Labs/ tests ordered today include:  Orders Placed This Encounter  Procedures  . EKG 12-Lead     Disposition:   FU with Tava Peery as needed  Signed, Jacari Iannello Meredith Leeds, MD  01/04/2020 12:42 PM     [facility] [address] [place] [address] [phone] (office) [phone] (fax)

## 2020-01-04 NOTE — Telephone Encounter (Signed)
Dr. Radene Ou calling to speak with Dr. Curt Bears.

## 2020-01-04 NOTE — Telephone Encounter (Signed)
Thank you! I have not received any correspondence from [facility] stating that further information was needed as of yet, so if someone can please scan it to my outlook e-mail when it comes in, I will have it readily available if they call me and ask for it.   Thank you!

## 2020-01-04 NOTE — Telephone Encounter (Signed)
Patient called to say she just left her pcp office for her surgical clearance and wanted to let us know that we should be receiving the info from his office. Please call her if she doesn't need to show up on Monday if for some reason she doesn't receive info before then.

## 2020-01-05 ENCOUNTER — Encounter: Payer: Self-pay | Admitting: Plastic Surgery

## 2020-01-05 ENCOUNTER — Telehealth: Payer: Self-pay

## 2020-01-05 NOTE — Telephone Encounter (Signed)
Call to pt- I informed her that [facility] preop/anesthesia has attempted to call her for prior/preop assessment & health/medical passport completion- before her surgery on Mon. 01/08/20 Pt denies any phone calls received on her cell- but she states they may have been calling another family member in error She states she has completed her on-line assessment as well as left v/m with preop  While I had the pt on hold- I called anesthesia at [facility]- & they directed pt to call: [phone] (x [phone]) & that someone is waiting to answer her call & finalize her assessment  Pt verified that she stopped taking her Xarelto on thurs. 01/04/20- & she understands she can resume this RX 5 days postop- per her PCP-Dr. Rankin & Dr. Jaynee Eagles- Neurology  Pt will call [facility]/preop now I will forward this information to Dr. Annabell Sabal, PA & have a copy scanned into chart

## 2020-01-10 ENCOUNTER — Telehealth: Payer: Self-pay

## 2020-01-10 NOTE — Telephone Encounter (Signed)
Patient lvm stating she is having issues with her drain, and it appears to be leaking.

## 2020-01-10 NOTE — Telephone Encounter (Signed)
I tried to call patient at 10:34 AM, sent to voicemail. No VML. If she is having leaking around the drain at the insertion site just outside her skin, she should cover the area with gauze to collect any drainage. If the leaking is coming from where the drain tube is attached to the bulb, she may need to fasten the tube onto the bulb better. Without speaking to her, I am unsure of the exact situation. Will try to call again later today

## 2020-01-12 NOTE — Telephone Encounter (Signed)
Called patient, she is having leaking around the tube site. Advised her to use gauze collect drainage. She is have 26m in one bulb and 511m in the other. Reminded her appointment is 01/16/20 at 8:00

## 2020-01-16 ENCOUNTER — Other Ambulatory Visit: Payer: Self-pay

## 2020-01-16 ENCOUNTER — Ambulatory Visit (INDEPENDENT_AMBULATORY_CARE_PROVIDER_SITE_OTHER): Payer: Self-pay | Admitting: Plastic Surgery

## 2020-01-16 ENCOUNTER — Encounter: Payer: Self-pay | Admitting: Plastic Surgery

## 2020-01-16 VITALS — BP 134/80 | HR 84 | Temp 97.3°F | Ht 63.0 in | Wt 164.0 lb

## 2020-01-16 DIAGNOSIS — Z719 Counseling, unspecified: Secondary | ICD-10-CM

## 2020-01-16 NOTE — Progress Notes (Addendum)
   Subjective:    Patient ID: [name] [name], female    DOB: [birth year], 69 y.o.   MRN: [id number]  The patient is a 69 year old white female here for follow-up after her abdominoplasty.  Overall she is doing extremely well.  Her output has been appropriate for this timeframe.  There was a little bit of drainage around the drain that seems to have stopped.  There is a little bit of irritation at the incision site but no sign of hematoma or seroma.  The incision appears to be healing well.  She has been wearing the binder.  She has a little bit of swelling of her legs.  This is to be expected.     Review of Systems  Constitutional: Positive for activity change. Negative for appetite change.  HENT: Negative.   Eyes: Negative.   Respiratory: Negative.   Cardiovascular: Positive for leg swelling.  Genitourinary: Negative.   Hematological: Negative.        Objective:   Physical Exam Vitals and nursing note reviewed.  Constitutional:      Appearance: Normal appearance.  HENT:     Head: Normocephalic and atraumatic.  Cardiovascular:     Rate and Rhythm: Normal rate.     Pulses: Normal pulses.  Pulmonary:     Effort: Pulmonary effort is normal.  Abdominal:     General: There is no distension.     Tenderness: There is abdominal tenderness.  Neurological:     General: No focal deficit present.     Mental Status: She is alert and oriented to person, place, and time.  Psychiatric:        Mood and Affect: Mood normal.         Assessment & Plan:     ICD-10-CM   1. Encounter for counseling  Z71.9     The right drain was removed.  We will evaluate the left drain next week.  She can go into a spanks.  She can also shower.

## 2020-01-23 ENCOUNTER — Encounter: Payer: Self-pay | Admitting: Surgical

## 2020-01-23 ENCOUNTER — Ambulatory Visit (INDEPENDENT_AMBULATORY_CARE_PROVIDER_SITE_OTHER): Payer: Medicare Other | Admitting: Surgical

## 2020-01-23 ENCOUNTER — Other Ambulatory Visit: Payer: Self-pay

## 2020-01-23 VITALS — BP 145/87 | HR 85 | Temp 97.1°F | Ht 63.0 in | Wt 164.0 lb

## 2020-01-23 DIAGNOSIS — S31109A Unspecified open wound of abdominal wall, unspecified quadrant without penetration into peritoneal cavity, initial encounter: Secondary | ICD-10-CM

## 2020-01-23 DIAGNOSIS — Z719 Counseling, unspecified: Secondary | ICD-10-CM

## 2020-01-23 NOTE — Progress Notes (Signed)
Patient is a 69 year old female here for follow-up after abdominoplasty on 01/08/2020 with Dr. Marla Roe.   She reports that she has had additional swelling in her legs and pubic area.  She reports that it has slightly improved over the past few days.  She has been elevating her legs while at home.  She is not having any leg pain with ambulation, erythema, cramping.  She is currently taking Xarelto, she restarted this approximately 10 days ago.  She also reportds that she had a large amount of drainage from her midline panniculectomy incision, she has a dressing on it today which has serosanguineous fluid.    She reports some mild weakness, but is otherwise feeling well without any fevers, chills, nausea, vomiting, chest pain, shortness of breath, no shortness of breath at night or when lying flat.  Patient would like to have a prescription for Lasix due to swelling in her lower extremities and pubic area.  She previously discussed this with her PCP who advised her to discuss with [place].  Patient and I discussed this today and I informed her that I was not comfortable prescribing furosemide due to her multiple medical conditions and it would be best to discuss further with her PCP as they manage her chronic medical conditions.  She chronically takes HCTZ which has provided minimal help.  Chaperone present on exam On exam incisions are mostly clean dry and intact, she does have some breakdown at the midline junction between the vertical incision and the horizontal panniculectomy incision.  There is a little bit of fibrinous exudate within the wound bed.  The wound bed is approximately 4 x 0.3 x 0.1 cm.  There is some periwound irritation, but no purulence or fluctuance noted.  No foul odor noted.  Drainage is serosanguineous from wound.    She has some soft tissue swelling of her left abdomen, I am unable to palpate any fluid collections. She has bilateral lower extremity swelling with 2+ DP pulses, no  erythema. Left JP drain in place with approximately 10 cc of serosanguineous fluid in bulb.  Plan:  Recommend daily dressing changes with Xeroform to panniculectomy incisional wound followed by 4 x 4 gauze and ABD pads.  She has been applying a maxi pad which has been helpful for collecting drainage. Continue wearing abdominal binder/spanks for compression to prevent additional swelling.   JP drain on the left still in place to assist with drainage.  Patient to follow-up at the end of this week or beginning of next week for removal pending output.  Recommend discussing further with PCP in regards to bilateral lower extremity swelling.  No signs of acute DVT.  Pictures were obtained of the patient and placed in the chart with the patient's or guardian's permission.  Please call with any questions or concerns.

## 2020-01-25 ENCOUNTER — Ambulatory Visit (INDEPENDENT_AMBULATORY_CARE_PROVIDER_SITE_OTHER): Payer: Medicare Other | Admitting: Family Medicine

## 2020-01-26 ENCOUNTER — Ambulatory Visit (INDEPENDENT_AMBULATORY_CARE_PROVIDER_SITE_OTHER): Payer: Medicare Other | Admitting: Surgical

## 2020-01-26 ENCOUNTER — Encounter: Payer: Self-pay | Admitting: Surgical

## 2020-01-26 ENCOUNTER — Other Ambulatory Visit: Payer: Self-pay

## 2020-01-26 VITALS — BP 115/72 | HR 76 | Temp 96.4°F | Ht 63.0 in | Wt 164.0 lb

## 2020-01-26 DIAGNOSIS — S31109A Unspecified open wound of abdominal wall, unspecified quadrant without penetration into peritoneal cavity, initial encounter: Secondary | ICD-10-CM

## 2020-01-26 DIAGNOSIS — Z719 Counseling, unspecified: Secondary | ICD-10-CM

## 2020-01-26 NOTE — Progress Notes (Signed)
Patient is a 69 year old female here in follow-up after abdominoplasty on 01/08/2020 with Dr. Marla Roe.  Patient is doing well today.  She has been wearing compression socks and reports this has been very helpful for her bilateral lower extremity swelling.  She is not having any fever, chills, nausea, vomiting, shortness of breath, chest pain.   She continues to have some drainage from her midline abdominal wound, reports that most of the drainage is light in color and does not appear to be any bright red blood.  Her description is consistent with serosanguineous drainage.  She continues to have a left JP drain in place, output over the past 24 hours has been less than 10 cc.  She has been applying Xeroform daily to abdominal wounds.  Chaperone present on exam Well-developed well-nourished white female, no acute distress, unlabored breathing, symmetric rise and fall. On exam bilateral lower extremities with compression socks in place, 2+ DP pulses bilaterally.  No pedal edema noted. Mild swelling of upper thighs noted.  Skin is not tight or shiny.  No erythema.  On exam panniculectomy incision is well-healed along the right lateral and left lateral incisions.  She does have some breakdown at the junction of the T and horizontal incision.  There is some fibrinous exudate within the wound bed, it is not worse than when she was seen on 01/23/2020.  She has some periwound irritation, but no sign of infection.  No purulence.  No fluctuance noted.  No fluid collections noted.  She does have some soft tissue swelling of her mons pubis which is causing some distortion of her abdomen, but it is minimally tender to palpation.  No foul odor.   Plan:  Continue to wear compression socks, elevate legs. Left JP drain removed today. Continue to apply Xeroform daily to midline abdominal wounds, apply Vaseline daily to left JP drain insertion site opening until it has healed. Call with any questions or concerns, we  are both pleased that patient's lower extremity swelling has improved, there is no sign of any VTE.  Patient has a follow-up scheduled 01/30/2020, if she is doing well prior she can call and cancel.  I would like to see her back in 1 week minimum.

## 2020-01-30 ENCOUNTER — Telehealth: Payer: Self-pay

## 2020-01-30 ENCOUNTER — Other Ambulatory Visit: Payer: Self-pay

## 2020-01-30 ENCOUNTER — Encounter: Payer: Self-pay | Admitting: Surgical

## 2020-01-30 ENCOUNTER — Ambulatory Visit (INDEPENDENT_AMBULATORY_CARE_PROVIDER_SITE_OTHER): Payer: Medicare Other | Admitting: Surgical

## 2020-01-30 VITALS — BP 156/84 | HR 75 | Temp 97.3°F | Ht 63.0 in | Wt 165.0 lb

## 2020-01-30 DIAGNOSIS — S31109A Unspecified open wound of abdominal wall, unspecified quadrant without penetration into peritoneal cavity, initial encounter: Secondary | ICD-10-CM

## 2020-01-30 DIAGNOSIS — Z719 Counseling, unspecified: Secondary | ICD-10-CM

## 2020-01-30 NOTE — Telephone Encounter (Signed)
Faxed new order for supplies for dressing changes to [facility] Call to [facility] spoke to Tori re: pt's wound opening has increased & is draining and bleeding- so pt needs more dressing supplies to change dressing 2-3 times/day- or as needed if soiled/saturated Per [place], [place] Copy scanned into chart

## 2020-01-30 NOTE — Progress Notes (Signed)
Patient is a 69 year old female here for follow-up after abdominoplasty on 01/08/2020 with Dr. Marla Roe. She reports that overall she is feeling well, she is having a lot of drainage from her midline abdominal wound, it is serosanguineous in nature.  She continues to wear compression socks, compression garment over her abdomen. She is not having any fevers, chills, nausea, vomiting, shortness of breath, chest pain, dizziness, vision changes, constipation. She is having normal bowel movements and voiding normally.  She has been applying Xeroform daily to abdominal wounds, unfortunately the supplies she was sent were very small and she is nearly out of supplies at this time.  Chaperone present on exam Well-developed well-nourished white female, no acute distress, unlabored breathing, symmetric rise and fall of chest Bilateral lower extremities with compression socks in place, 2+ DP pulses noted, no pedal edema noted.   Panniculectomy incision is well-healed at the peripheral incisions, she does have some breakdown which is superficial at the T junction and just left medial to this area. The overall size of her wound breakdown is approximately 0.5 x 7 x 0.1 cm in size, fibrinous exudate noted within wound bed. There is no periincisional erythema, foul odor, fluctuance noted. I was able to express a little bit of a small hematoma and serosanguineous fluid. Soft tissue swelling of mons pubis has significantly improved, abdomen is more midline and symmetric today.  Plan: Recommend continuing to apply Xeroform daily to abdominal wounds followed by 4 x 4 gauze and an absorptive dressing. I do notice improvement in her horizontal incisional wounds, they are slowly healing with some fibrinous exudate noted at the wound bed, no sign of any infection. No sign of any large fluid collections noted. She did have a little bit of a hematoma, but this was expressed and I did not palpate any more collections.  Call  with any questions or concerns, follow-up scheduled for 1 week.  Pictures were obtained of the patient and placed in the chart with the patient's or guardian's permission.

## 2020-01-31 ENCOUNTER — Telehealth: Payer: Self-pay | Admitting: *Deleted

## 2020-01-31 NOTE — Telephone Encounter (Signed)
Fax order on (01/23/20) to [place] for supplies for the patient.  Confirmation received.  Copy scanned into the chart.  Received Order Status Notification on (01/23/20) from [facility].//AB/CMA

## 2020-02-05 NOTE — Progress Notes (Signed)
Patient is a 69 year old female here for follow-up after abdominoplasty on 01/08/2020 with Dr. Marla Roe.  Patient is overall doing better, in regards to her wound healing of her midline abdomen it is doing well.  She has had less drainage and the wound is smaller today.  She reports that she continues to notice swelling of her bilateral lower extremities.  On exam today she does not have any swelling of her distal lower extremities and it is difficult to determine if she has any swelling of her bilateral thighs, I do not notice any significant swelling.  She reports other than the swelling she is feeling well.  She is not having any fevers, chills, nausea, vomiting, chest pain, shortness of breath, leg cramping.  Patient is traveling to the beach next week.  Chaperone present on exam On exam majority of her incisions are healing well, she does have a wound at the midline that is approximately 0.4 x 5 x 0.1 cm in size.  There is fibrinous exudate noted within the wound bed.  There is no periincisional erythema.  No foul odor.  No purulent drainage.  Serosanguineous drainage noted on exam.  No fluid collections noted within abdomen or pubic area.  Minimal tenderness to palpation.  Recommend continuing to apply Xeroform gauze daily followed by 4 x 4 gauze, ABD pad.  Her wounds are improving and overall appear to be healing nicely.  If there is minimal improvement in the next 2 weeks will consider collagen or applying donated ACell.  There is no sign of any infection, seroma, hematoma.  I do not see any evidence of any VTE.  She does not have any leg cramping, erythema, significant swelling that I am able to visualize.  Recommend patient further discuss swelling of thighs with PCP as she is now 5 weeks postop.   Call with any questions or concerns.  Schedule follow-up for 2 weeks. Pictures were obtained of the patient and placed in the chart with the patient's or guardian's permission.

## 2020-02-06 ENCOUNTER — Encounter: Payer: Self-pay | Admitting: Surgical

## 2020-02-06 ENCOUNTER — Other Ambulatory Visit: Payer: Self-pay

## 2020-02-06 ENCOUNTER — Ambulatory Visit (INDEPENDENT_AMBULATORY_CARE_PROVIDER_SITE_OTHER): Payer: Self-pay | Admitting: Surgical

## 2020-02-06 VITALS — BP 137/83 | HR 73 | Temp 97.7°F | Ht 63.0 in | Wt 165.0 lb

## 2020-02-06 DIAGNOSIS — Z719 Counseling, unspecified: Secondary | ICD-10-CM

## 2020-02-06 DIAGNOSIS — S31109A Unspecified open wound of abdominal wall, unspecified quadrant without penetration into peritoneal cavity, initial encounter: Secondary | ICD-10-CM

## 2020-02-08 ENCOUNTER — Ambulatory Visit (INDEPENDENT_AMBULATORY_CARE_PROVIDER_SITE_OTHER): Payer: Medicare Other | Admitting: Family Medicine

## 2020-02-14 ENCOUNTER — Telehealth: Payer: Self-pay | Admitting: *Deleted

## 2020-02-14 NOTE — Telephone Encounter (Signed)
Received Physician Signature & Date Request via of fax from [facility] [place].  Requesting signature.  Given to provider to complete.  Request signed and faxed to [facility]. Confirmation received and copy scanned into the chart.//AB/CMA

## 2020-02-20 ENCOUNTER — Other Ambulatory Visit: Payer: Self-pay

## 2020-02-20 ENCOUNTER — Ambulatory Visit (INDEPENDENT_AMBULATORY_CARE_PROVIDER_SITE_OTHER): Payer: Medicare Other | Admitting: Plastic Surgery

## 2020-02-20 ENCOUNTER — Encounter: Payer: Self-pay | Admitting: Plastic Surgery

## 2020-02-20 VITALS — BP 137/81 | HR 70 | Temp 98.1°F | Ht 63.0 in | Wt 163.0 lb

## 2020-02-20 DIAGNOSIS — M793 Panniculitis, unspecified: Secondary | ICD-10-CM

## 2020-02-20 DIAGNOSIS — Z719 Counseling, unspecified: Secondary | ICD-10-CM

## 2020-02-20 NOTE — Progress Notes (Signed)
The patient is a 69 year old female here for follow-up after undergoing abdominoplasty.  Overall she is doing much much better.  The swelling in the lower abdomen and thigh area has improved.  She has a little bit of swelling in the suprapubic area.  There is 1 area in the middle that it has a little bit of drainage.  It is about 2 mm opening.  There is no sign of infection.  The there is no redness.  Recommend continuing with Vaseline to the area and keeping it covered.  She should wait to do any heavy exercise of the lower extremities for at least 1 more week.  I had like to see her back in 1 month.

## 2020-02-22 ENCOUNTER — Encounter (INDEPENDENT_AMBULATORY_CARE_PROVIDER_SITE_OTHER): Payer: Self-pay | Admitting: Family Medicine

## 2020-02-22 ENCOUNTER — Ambulatory Visit (INDEPENDENT_AMBULATORY_CARE_PROVIDER_SITE_OTHER): Payer: Medicare Other | Admitting: Family Medicine

## 2020-02-22 ENCOUNTER — Other Ambulatory Visit: Payer: Self-pay

## 2020-02-22 VITALS — BP 133/77 | HR 65 | Temp 97.6°F | Ht 63.0 in | Wt 155.0 lb

## 2020-02-22 DIAGNOSIS — I10 Essential (primary) hypertension: Secondary | ICD-10-CM | POA: Diagnosis not present

## 2020-02-22 DIAGNOSIS — E1169 Type 2 diabetes mellitus with other specified complication: Secondary | ICD-10-CM

## 2020-02-22 DIAGNOSIS — E669 Obesity, unspecified: Secondary | ICD-10-CM | POA: Diagnosis not present

## 2020-02-22 DIAGNOSIS — Z6830 Body mass index (BMI) 30.0-30.9, adult: Secondary | ICD-10-CM

## 2020-02-22 MED ORDER — METFORMIN HCL 500 MG PO TABS: 500.0000 mg | ORAL_TABLET | Freq: Every day | ORAL | 0 refills | Status: DC

## 2020-02-22 MED ORDER — LOSARTAN POTASSIUM 50 MG PO TABS: 50.0000 mg | ORAL_TABLET | Freq: Every day | ORAL | 0 refills | Status: DC

## 2020-02-22 MED ORDER — HYDROCHLOROTHIAZIDE 25 MG PO TABS: 25.0000 mg | ORAL_TABLET | Freq: Every day | ORAL | 0 refills | Status: DC

## 2020-02-22 NOTE — Progress Notes (Signed)
Chief Complaint:   OBESITY [name] [name] [name] is here to discuss her progress with her obesity treatment plan along with follow-up of her obesity related diagnoses. [name] [name] [name] is on the Category 2 Plan [name] states she is following her eating plan approximately 50% of the time. [name] [name] [name] states she is doing cardio [name] on the machine for 30-60 minutes 4 times per week.  Today's visit was #: 43 Starting weight: 258 lbs Starting date: 04/27/2018 Today's weight: 155 lbs Today's date: 02/22/2020 Total lbs lost to date: 103 Total lbs lost since last in-office visit: 0  Interim History: [name] [name] [name] is status post tummy tuck on January 08, 2020. Her last office visit was March 31st. She has had quite a bit of swelling in her legs which likely accounts fort some weight gain. . She is struggling to get back on track.  Subjective:   1. Essential hypertension [name] [name] [name]'s blood pressure is well controlled. Cardiovascular ROS: no chest pain or shortness of breath.  BP Readings from Last 3 Encounters:  02/22/20 133/77  02/20/20 137/81  02/06/20 137/83   Lab Results  Component Value Date   CREATININE 0.80 10/12/2019   CREATININE 0.75 06/04/2019   CREATININE 0.82 06/01/2019   2. Type 2 diabetes mellitus with other specified complication, without long-term current use of insulin (HCC) [name] [name] [name]'s diabetes mellitus is in remission. She is taking [name] [name] 500 mg QD. Last A1c was 4.6. She notes polyphagia at times.  Lab Results  Component Value Date   HGBA1C 4.6 (L) 10/12/2019   HGBA1C 4.8 05/04/2019   HGBA1C 5.1 11/03/2018   Lab Results  Component Value Date   LDLCALC 65 10/12/2019   CREATININE 0.80 10/12/2019   Lab Results  Component Value Date   INSULIN 3.5 10/12/2019   INSULIN 6.9 08/02/2018   INSULIN 7.0 04/27/2018   Assessment/Plan:   1. Essential hypertension [name] [name] [name] is working on healthy weight [name] [name] [name] [name] to improve blood pressure control. We will watch for signs of hypotension as she continues her  lifestyle modifications. We will refill hydrochlorothiazide [name] losartan for 90 days with no refills.  - hydrochlorothiazide (HYDRODIURIL) 25 MG tablet; Take 1 tablet (25 mg total) by mouth daily.  Dispense: 90 tablet; Refill: 0 - losartan (COZAAR) 50 MG tablet; Take 1 tablet (50 mg total) by mouth daily.  Dispense: 90 tablet; Refill: 0  2. Type 2 diabetes mellitus with other specified complication, without long-term current use of insulin (HCC) [name] [name] [name], [name] [name] [name], [name] [name] [name], [name] [name] [name], [name] [name] [name], [name] [name] [name], [name] [name] [name], [name] we will refill for 90 days with no refills.  - [name] [name] (GLUCOPHAGE) 500 MG tablet; Take 1 tablet (500 mg total) by mouth daily with breakfast.  Dispense: 90 tablet; Refill: 0  3. Class 1 obesity with serious comorbidity [name] body mass index (BMI) of 30.0 to 30.9 in adult, unspecified obesity type [name] [name] [name] is currently in the action stage of change. As such, her goal is to continue with weight [name] [name] efforts. She has agreed to the Category 2 Plan.   [name] goals: As is.  Behavioral modification strategies: increasing lean protein intake [name] decreasing simple carbohydrates.  [name] [name] [name] has agreed to follow-up with our clinic in 2 weeks. She was informed of the importance of frequent follow-up visits to maximize her success with intensive lifestyle modifications for her  multiple health conditions.   Objective:   Blood pressure 133/77, pulse 65, temperature 97.6 F (36.4 C), temperature source Oral, height 5' 3" (1.6 m), weight 155 lb (70.3 kg), SpO2 98 %. Body mass index is 27.46 kg/m.  General: Cooperative, alert, well developed, in no acute distress. HEENT: Conjunctivae [name] lids unremarkable. Cardiovascular: Regular  rhythm.  Lungs: Normal work of breathing. Neurologic: No focal deficits.   Lab Results  Component Value Date   CREATININE 0.80 10/12/2019   BUN 15 10/12/2019   NA 139 10/12/2019   K 4.2 10/12/2019   CL 100 10/12/2019   CO2 26 10/12/2019   Lab Results  Component Value Date   ALT 19 10/12/2019   AST 26 10/12/2019   ALKPHOS 69 10/12/2019   BILITOT 0.3 10/12/2019   Lab Results  Component Value Date   HGBA1C 4.6 (L) 10/12/2019   HGBA1C 4.8 05/04/2019   HGBA1C 5.1 11/03/2018   HGBA1C 5.4 08/02/2018   HGBA1C 6.9 (H) 04/27/2018   Lab Results  Component Value Date   INSULIN 3.5 10/12/2019   INSULIN 6.9 08/02/2018   INSULIN 7.0 04/27/2018   Lab Results  Component Value Date   TSH 2.100 05/04/2019   Lab Results  Component Value Date   CHOL 157 10/12/2019   HDL 79 10/12/2019   LDLCALC 65 10/12/2019   TRIG 68 10/12/2019   CHOLHDL 2.7 04/09/2016   Lab Results  Component Value Date   WBC 8.0 06/04/2019   HGB 13.6 06/04/2019   HCT 42.2 06/04/2019   MCV 88.5 06/04/2019   PLT 244 06/04/2019   Lab Results  Component Value Date   IRON 28 11/03/2018   TIBC 335 11/03/2018   FERRITIN 34 11/03/2018    Obesity Behavioral Intervention Documentation for Insurance:   Approximately 15 minutes were spent on the discussion below.  ASK: We discussed the diagnosis of obesity with [name] [name] [name] today [name] [name] [name] agreed to give [place] permission to discuss obesity behavioral modification therapy today.  ASSESS: [name] [name] has the diagnosis of obesity [name] her BMI today is 27.46. [name] [name] is in the action stage of change.   ADVISE: [name] [name] was educated on the multiple health risks of obesity as well as the benefit of weight [name] [name] to improve her health. She was advised of the need for long term treatment [name] the importance of lifestyle modifications to improve her current health [name] to decrease her risk of future health problems.  AGREE: Multiple dietary modification options [name] treatment options  were discussed [name] [name] [name] agreed to follow the recommendations documented in the above note.  ARRANGE: [name] [name] was educated on the importance of frequent visits to treat obesity as outlined per CMS [name] USPSTF guidelines [name] agreed to schedule her next follow up appointment today.  Attestation Statements:   Reviewed by clinician on day of visit: allergies, medications, problem list, medical history, surgical history, family history, social history, [name] previous encounter notes.   [name], am acting as Location manager for [name], FNP-C.  I have reviewed the above documentation for accuracy [name] completeness, [name] I agree with the above. - Georgianne Fick, FNP

## 2020-03-07 ENCOUNTER — Other Ambulatory Visit: Payer: Self-pay

## 2020-03-07 ENCOUNTER — Ambulatory Visit (INDEPENDENT_AMBULATORY_CARE_PROVIDER_SITE_OTHER): Payer: Medicare Other | Admitting: Family Medicine

## 2020-03-07 ENCOUNTER — Encounter (INDEPENDENT_AMBULATORY_CARE_PROVIDER_SITE_OTHER): Payer: Self-pay | Admitting: Family Medicine

## 2020-03-07 VITALS — BP 135/73 | HR 59 | Temp 97.8°F | Ht 63.0 in | Wt 152.0 lb

## 2020-03-07 DIAGNOSIS — F3289 Other specified depressive episodes: Secondary | ICD-10-CM

## 2020-03-07 DIAGNOSIS — E1159 Type 2 diabetes mellitus with other circulatory complications: Secondary | ICD-10-CM

## 2020-03-07 DIAGNOSIS — I152 Hypertension secondary to endocrine disorders: Secondary | ICD-10-CM

## 2020-03-07 DIAGNOSIS — I10 Essential (primary) hypertension: Secondary | ICD-10-CM

## 2020-03-07 DIAGNOSIS — E1169 Type 2 diabetes mellitus with other specified complication: Secondary | ICD-10-CM | POA: Diagnosis not present

## 2020-03-07 DIAGNOSIS — Z6830 Body mass index (BMI) 30.0-30.9, adult: Secondary | ICD-10-CM | POA: Diagnosis not present

## 2020-03-07 DIAGNOSIS — E669 Obesity, unspecified: Secondary | ICD-10-CM

## 2020-03-07 MED ORDER — HYDROCHLOROTHIAZIDE 25 MG PO TABS: 25.0000 mg | ORAL_TABLET | Freq: Every day | ORAL | 0 refills | Status: DC

## 2020-03-07 MED ORDER — LOSARTAN POTASSIUM 50 MG PO TABS: 50.0000 mg | ORAL_TABLET | Freq: Every day | ORAL | 0 refills | Status: DC

## 2020-03-07 MED ORDER — OZEMPIC (0.25 OR 0.5 MG/DOSE) 2 MG/1.5ML ~~LOC~~ SOPN: 0.2500 mg | PEN_INJECTOR | SUBCUTANEOUS | 0 refills | Status: DC

## 2020-03-07 NOTE — Progress Notes (Signed)
Office: [phone]  /  Fax: [phone]    Date: March 11, 2020   Appointment Start Time: 11:43am Duration: 32 minutes Provider: Glennie Isle, Psy.D. Type of Session: Intake for Individual Therapy  Location of Patient: Home Location of Provider: Provider's Home Type of Contact: Telepsychological Visit via MyChart Video Visit  Informed Consent: Prior to proceeding with today's appointment, two pieces of identifying information were obtained. In addition, [name] [name]'s physical location at the time of this appointment was obtained as well a phone number she could be reached at in the event of technical difficulties. [name] and this provider participated in today's telepsychological service.   The provider's role was explained to [employer]. The provider reviewed and discussed issues of confidentiality, privacy, and limits therein (e.g., reporting obligations). In addition to verbal informed consent, written informed consent for psychological services was obtained prior to the initial appointment. Since the clinic is not a 24/7 crisis center, mental health emergency resources were shared and this  provider explained MyChart, e-mail, voicemail, and/or other messaging systems should be utilized only for non-emergency reasons. This provider also explained that information obtained during appointments will be placed in [name] [name]'s medical record and relevant information will be shared with other providers at [facility] for coordination of care. Moreover, [name] [name] agreed information may be shared with other [facility] providers as needed for coordination of care. By signing the service agreement document, [name] [name] provided written consent for coordination of care. Prior to initiating telepsychological services, [name] [name] completed an informed consent document, which included the development of a safety plan (i.e., an emergency contact, nearest emergency room, and emergency resources)  in the event of an emergency/crisis. [name] [name] expressed understanding of the rationale of the safety plan. [name] [name] verbally acknowledged understanding she is ultimately responsible for understanding her insurance benefits for telepsychological and in-person services. This provider also reviewed confidentiality, as it relates to telepsychological services, as well as the rationale for telepsychological services (i.e., to reduce exposure risk to COVID-19). [name] [name]  acknowledged understanding that appointments cannot be recorded without both party consent and she is aware she is responsible for securing confidentiality on her end of the session. [name] [name] verbally consented to proceed.  Chief Complaint/HPI: [name] [name] was referred by [facility], FNP-C due to other depression, with emotional eating. Per the note for the visit with Jake Bathe, FNP-C on March 07, 2020, "[name] [name] notes some mild depression. She does struggle a great deal with emotional eating when stressed, bored, angry and/or discouraged. She has hx of gastric bypass in 2012 and lost from 295 to 213.  She did gain quite a bit of weight back after surgery but has done very well on our program losing a total of 106 lbs."   During today's appointment, [name] [name] was verbally administered a questionnaire assessing various behaviors related to emotional eating. [name] [name] endorsed the following: experience food cravings on a regular basis, eat certain foods when you are anxious, stressed, depressed, or your feelings are hurt, use food to help you cope with emotional situations, find food is comforting to you, overeat when you are angry or upset, overeat frequently when you are bored or lonely, overeat when you are angry at someone just to show them they cannot control you and eat as a reward. She shared she craves chips, crackers, and cookies. [name] [name] believes the onset of emotional eating was likely in childhood and described the current frequency of emotional eating as  "6 days a week." In addition, [name] [name] denied a  history of binge eating. [name] [name] denied a history of restricting food intake, purging and engagement in other compensatory strategies, and has never been diagnosed with an eating disorder. She also denied a history of treatment for emotional eating. Furthermore, [name] [name] reporting feeling anger with ongoing stressors with her mother, adding her mother is not supportive.   Mental Status Examination:  Appearance: well groomed and appropriate hygiene  Behavior: appropriate to circumstances Mood: euthymic Affect: mood congruent Speech: normal in rate, volume, and tone Eye Contact: appropriate Psychomotor Activity: appropriate Gait: unable to assess Thought Process: linear, logical, and goal directed  Thought Content/Perception: denies suicidal and homicidal ideation, plan, and intent and no hallucinations, delusions, bizarre thinking or behavior reported or observed Orientation: time, person, place, and purpose of appointment Memory/Concentration: memory, attention, language, and fund of knowledge intact  Insight/Judgment: good  Family & Psychosocial History: [name] [name] reported she is not in a relationship and she does not have any children. She indicated she is currently retired. Additionally, [name] [name] shared her highest level of education obtained is an associate's degree. Currently, [name] [name]'s social support system consists of her friend [name]) and [name]. Moreover, [name] [name] stated she resides alone.  Medical History:  Past Medical History:  Diagnosis Date  . Anemia   . Arthritis    "knees, hips, possibly back" (04/08/2016)  . Atrial septal aneurysm   . B12 deficiency    a. following gastric bypass.  . Back pain   . Chronic lower back pain    spondylosis  . Cold feet   . Daily headache    "short ones for the last month or 2" (04/08/2016)  . Depression   . Diabetes ([place])   . Dry mouth   . DVT (deep venous thrombosis) ([place])   . Easy bruising    . Floaters in visual field   . GERD (gastroesophageal reflux disease)    takes Protonix daily "just to protect my stomach; not for reflux" (04/08/2016)  . Hay fever   . Heart murmur    "dx'd by 1 anesthesiologist years and years ago"  . History of loop recorder 03/2016  . Hyperlipidemia   . Hypertension   . Joint pain   . Leg cramp   . Macular degeneration    "just watching"  . Neuropathy   . Nosebleed   . Palpitations   . Peripheral edema   . Peripheral neuropathy    not on any meds  . 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  [name] Pneumonia 12/2017  . Sleep apnea    no CPAP since weight loss after bariatric surgery '12 (04/08/2016)  . Spondylosis   . Stroke ([place]) 04/07/2016   a. 01/2016 and 03/2016. during admission had + LE DVT.  [name] Swelling of extremity   . TIA (transient ischemic attack)   . Trouble in sleeping   . Urinary incontinence   . Weakness    Past Surgical History:  Procedure Laterality Date  . CATARACT EXTRACTION W/ INTRAOCULAR LENS  IMPLANT, BILATERAL Bilateral ~ 2012  . COLONOSCOPY    . EP IMPLANTABLE DEVICE N/A 04/10/2016   Procedure: Loop Recorder Insertion;  Surgeon: Will Meredith Leeds, MD;  Location: [facility];  Service: Cardiovascular;  Laterality: N/A;  . ESOPHAGOGASTRODUODENOSCOPY     in the 70's  . FACIAL COSMETIC SURGERY    . JOINT REPLACEMENT    . 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 SINUS ENDO W/FUSION  02/28/2016   Procedure: ENDOSCOPIC SINUS  SURGERY WITH NAVIGATION;  Surgeon: Ruby Cola, MD;  Location: [facility];  Service: ENT;;  . TEE WITHOUT CARDIOVERSION N/A 04/10/2016   Procedure: TRANSESOPHAGEAL ECHOCARDIOGRAM (TEE) POSSIBLE LOOP;  Surgeon: Sanda Klein, MD;  Location: [facility];  Service: Cardiovascular;  Laterality: N/A;  . TONSILLECTOMY  1957  . TOTAL HIP ARTHROPLASTY Right 2008  . TOTAL HIP REVISION Right 03/30/2016   Procedure: RIGHT TOTAL HIP REVISION;  Surgeon: Frederik Pear, MD;  Location: [facility];  Service:  Orthopedics;  Laterality: Right;  . TOTAL KNEE ARTHROPLASTY Bilateral 2001  . VAGINAL HYSTERECTOMY     "left my ovaries"   Current Outpatient Medications on File Prior to Visit  Medication Sig Dispense Refill  . cholecalciferol (VITAMIN D3) 25 MCG (1000 UT) tablet Take 5,000 Units by mouth daily.    . diazepam (VALIUM) 2 MG tablet TAKE 1 TABLET BY MOUTH TWICE A DAY AS NEEDED FOR VERTIGO    . ferrous sulfate 325 (65 FE) MG tablet Take 325 mg by mouth 2 (two) times daily with a meal.    . gabapentin (NEURONTIN) 300 MG capsule Take 2 capsules (600 mg total) by mouth at bedtime. 180 capsule 4  . hydrochlorothiazide (HYDRODIURIL) 25 MG tablet Take 1 tablet (25 mg total) by mouth daily. 90 tablet 0  . losartan (COZAAR) 50 MG tablet Take 1 tablet (50 mg total) by mouth daily. 90 tablet 0  . magnesium oxide (MAG-OX) 400 MG tablet Take 400 mg by mouth at bedtime.     . Multiple Vitamins-Minerals (OCUVITE-LUTEIN PO) Take 1 tablet by mouth daily.    . rosuvastatin (CRESTOR) 20 MG tablet Take 1 tablet (20 mg total) by mouth daily. (Patient taking differently: Take 20 mg by mouth at bedtime. ) 30 tablet 0  . Semaglutide,0.25 or 0.5MG/DOS, (OZEMPIC, 0.25 OR 0.5 MG/DOSE,) 2 MG/1.5ML SOPN Inject 0.1875 mLs (0.25 mg total) into the skin once a week. 1 pen 0  . XARELTO 20 MG TABS tablet Take 20 mg by mouth daily.     No current facility-administered medications on file prior to visit.  Eilee denied a history of head injuries and loss of consciousness.   Mental Health History: Lynnetta reported a history of mental services around 1994 to address depression, noting she attended psychiatric services and group therapy. Tiani reported there is no history of hospitalizations for psychiatric concerns. Tiffinie denied a family history of mental health related concerns. Valborg reported there is no history of trauma including psychological, physical  and sexual abuse, as well as neglect.   Merve described her typical  mood lately as "angry/frustrated," which she believes is related to her weight loss journey and an issue with a surgery. Aside from concerns noted above and endorsed on the PHQ-9 and GAD-7, Memorie reported experiencing decreased motivation. Gene endorsed current alcohol use. She explained she consumes one standard drink approximately couple times a week. She denied current tobacco use. She denied illicit/recreational substance use. Regarding caffeine intake, Nathalya reported consuming 4-5 cups of coffee daily. Furthermore, Ellen indicated she is not experiencing the following: hallucinations and delusions, paranoia, symptoms of mania , social withdrawal, crying spells and panic attacks. She also denied current suicidal ideation, plan, and intent; history of and current homicidal ideation, plan, and intent; and history of and current engagement in self-harm.  Chermaine reported she experienced suicidal ideation around 1994, which was when she initiated mental health services. She indicated she "gave different plans thoughts," but denied a history of suicidal intent and attempts. She last  experienced suicidal ideation in 1995. The following protective factors were identified for Tiffine: enjoying weight loss and well being, desire to travel, and desire to enjoy life. If she were to become overwhelmed in the future, which is a sign that a crisis may occur, she identified the following coping skills she could engage in: contact friend, go shopping, watch television, and read. It was recommended the aforementioned be written down and developed into a coping card for future reference; she was observed writing. Psychoeducation regarding the importance of reaching out to a trusted individual and/or utilizing emergency resources if there is a change in emotional status and/or there is an inability to ensure safety was provided. Lucienne's confidence in reaching out to a trusted individual and/or utilizing emergency resources  should there be an intensification in emotional status and/or there is an inability to ensure safety was assessed on a scale of one to ten where one is not confident and ten is extremely confident. She reported her confidence is a 10. Additionally, Charlisha denied current access to firearms and/or weapons.   The following strengths were reported by [name] [name]: loyal, good daughter, and good friend. The following strengths were observed by this provider: ability to express thoughts and feelings during the therapeutic session, ability to establish and benefit from a therapeutic relationship, willingness to work toward established goal(s) with the clinic and ability to engage in reciprocal conversation.   Legal History: Kathlynn reported there is no history of legal involvement.   Structured Assessments Results: The Patient Health Questionnaire-9 (PHQ-9) is a self-report measure that assesses symptoms and severity of depression over the course of the last two weeks. Randee obtained a score of 6 suggesting mild depression. Anasia finds the endorsed symptoms to be not difficult at all. [0= Not at all; 1= Several days; 2= More than half the days; 3= Nearly every day] Little interest or pleasure in doing things 0  Feeling down, depressed, or hopeless 0  Trouble falling or staying asleep, or sleeping too much 3  Feeling tired or having little energy 0  Poor appetite or overeating 3  Feeling bad about yourself --- or that you are a failure or have let yourself or your family down 0  Trouble concentrating on things, such as reading the newspaper or watching television 0  Moving or speaking so slowly that other people could have noticed? Or the opposite --- being so fidgety or restless that you have been moving around a lot more than usual 0  Thoughts that you would be better off dead or hurting yourself in some way 0  PHQ-9 Score 6    The Generalized Anxiety Disorder-7 (GAD-7) is a brief self-report measure that  assesses symptoms of anxiety over the course of the last two weeks. Linette obtained a score of 3 suggesting minimal anxiety. Makinsey finds the endorsed symptoms to be somewhat difficult. [0= Not at all; 1= Several days; 2= Over half the days; 3= Nearly every day] Feeling nervous, anxious, on edge 0  Not being able to stop or control worrying 0  Worrying too much about different things 0  Trouble relaxing 0  Being so restless that it's hard to sit still 0  Becoming easily annoyed or irritable 3  Feeling afraid as if something awful might happen 0  GAD-7 Score 3   Interventions:  Conducted a chart review Focused on rapport building Verbally administered PHQ-9 and GAD-7 for symptom monitoring Verbally administered Food & Mood questionnaire to assess various behaviors related to  emotional eating Provided emphatic reflections and validation Collaborated with patient on a treatment goal  Psychoeducation provided regarding physical versus emotional hunger Conducted a risk assessment Developed a coping card  Provisional DSM-5 Diagnosis(es): 311 (F32.8) Other Specified Depressive Disorder, Emotional Eating Behaviors  Plan: Larayah appears able and willing to participate as evidenced by collaboration on a treatment goal, engagement in reciprocal conversation, and asking questions as needed for clarification. The next appointment will be scheduled in approximately two weeks, which will be via MyChart Video Visit. The following treatment goal was established: increase coping skills. This provider will regularly review the treatment plan and medical chart to keep informed of status changes. Blanche expressed understanding and agreement with the initial treatment plan of care. Judit will be sent a handout via e-mail to utilize between now and the next appointment to increase awareness of hunger patterns and subsequent eating. Tunisha provided verbal consent during today's appointment for this provider to send  the handout via e-mail.

## 2020-03-11 ENCOUNTER — Telehealth (INDEPENDENT_AMBULATORY_CARE_PROVIDER_SITE_OTHER): Payer: Medicare Other | Admitting: Psychology

## 2020-03-11 ENCOUNTER — Other Ambulatory Visit: Payer: Self-pay

## 2020-03-11 ENCOUNTER — Encounter (INDEPENDENT_AMBULATORY_CARE_PROVIDER_SITE_OTHER): Payer: Self-pay | Admitting: Family Medicine

## 2020-03-11 DIAGNOSIS — F3289 Other specified depressive episodes: Secondary | ICD-10-CM

## 2020-03-11 NOTE — Progress Notes (Signed)
Chief Complaint:   OBESITY [name] [name] is here to discuss her progress with her obesity treatment plan along with follow-up of her obesity related diagnoses. [name] [name] is on the Category 2 Plan and states she is following her eating plan approximately 75% of the time. [name] [name] states she is doing cardio for 30-60 minutes 5 times per week.  Today's visit was #: 56 Starting weight: 258 lbs Starting date: 04/27/2018 Today's weight: 152 lbs Today's date: 03/07/2020 Total lbs lost to date: 106 Total lbs lost since last in-office visit: 3  Interim History: [name] [name] is back on the plan mostly. She feels that her swelling is decreased. She is status post abdominoplasty and had sever leg swelling post op..  Subjective:   1. Essential hypertension [name] [name]'s blood pressure is well controlled on hydrochlorothiazide and losartan. Cardiovascular ROS: no chest pain or dyspnea on exertion.  BP Readings from Last 3 Encounters:  03/07/20 135/73  02/22/20 133/77  02/20/20 137/81   Lab Results  Component Value Date   CREATININE 0.80 10/12/2019   CREATININE 0.75 06/04/2019   CREATININE 0.82 06/01/2019   2. Type 2 diabetes mellitus without complication, without long-term current use of insulin ([place])  Her diabetes mellitus is well controlled. She denies hypoglycemia. Ajee notes polyphagia.  Lab Results  Component Value Date   HGBA1C 4.6 (L) 10/12/2019   HGBA1C 4.8 05/04/2019   HGBA1C 5.1 11/03/2018   Lab Results  Component Value Date   LDLCALC 65 10/12/2019   CREATININE 0.80 10/12/2019   Lab Results  Component Value Date   INSULIN 3.5 10/12/2019   INSULIN 6.9 08/02/2018   INSULIN 7.0 04/27/2018   3. Other depression, emotional eating [name] [name] notes some mild depression. She does struggle a great deal with emotional eating when stressed, bored, angry and/or discouraged. She has hx of gastric bypass in 2012 and lost from 295 to 213.  She did gain quite a bit of weight back after surgery but has  done very well on our program losing a total of 106 lbs.   Assessment/Plan:   1. Essential hypertension Karren is working on healthy weight loss and exercise to improve blood pressure control. We will watch for signs of hypotension as she continues her lifestyle modifications. We will refill hydrochlorothiazide and losartan for 90 days with no refills.  - hydrochlorothiazide (HYDRODIURIL) 25 MG tablet; Take 1 tablet (25 mg total) by mouth daily.  Dispense: 90 tablet; Refill: 0 - losartan (COZAAR) 50 MG tablet; Take 1 tablet (50 mg total) by mouth daily.  Dispense: 90 tablet; Refill: 0  2. Type 2 diabetes mellitus without complication, without long-term current use of insulin ([place])  Treonna agreed to start Ozempic 0.25 mg weekly with no refills. We will see if this helps with polyphagia.   - Semaglutide,0.25 or 0.5MG/DOS, (OZEMPIC, 0.25 OR 0.5 MG/DOSE,) 2 MG/1.5ML SOPN; Inject 0.1875 mLs (0.25 mg total) into the skin once a week.  Dispense: 1 pen; Refill: 0  3. Other depression, emotional eating Behavior modification techniques were discussed today to help [name] [name] deal with her emotional/non-hunger eating behaviors. We will refer to Dr. Mallie Mussel, our Bariatric Psychologist for evaluation. Orders and follow up as documented in patient record.   4. Class 1 obesity with serious comorbidity and body mass index (BMI) of 30.0 to 30.9 in adult, unspecified obesity type [name] [name] is currently in the action stage of change. As such, her goal is to continue with weight loss efforts. She has agreed to the Category 2 Plan.  Exercise goals: As is.  Behavioral modification strategies: decreasing simple carbohydrates.  [name] [name] has agreed to follow-up with our clinic in 2 weeks. She was informed of the importance of frequent follow-up visits to maximize her success with intensive lifestyle modifications for her multiple health conditions.   Objective:   Blood pressure 135/73, pulse (!) 59, temperature 97.8 F  (36.6 C), temperature source Oral, height 5' 3" (1.6 m), weight 152 lb (68.9 kg), SpO2 99 %. Body mass index is 26.93 kg/m.  General: Cooperative, alert, well developed, in no acute distress. HEENT: Conjunctivae and lids unremarkable. Cardiovascular: Regular rhythm.  Lungs: Normal work of breathing. Neurologic: No focal deficits.   Lab Results  Component Value Date   CREATININE 0.80 10/12/2019   BUN 15 10/12/2019   NA 139 10/12/2019   K 4.2 10/12/2019   CL 100 10/12/2019   CO2 26 10/12/2019   Lab Results  Component Value Date   ALT 19 10/12/2019   AST 26 10/12/2019   ALKPHOS 69 10/12/2019   BILITOT 0.3 10/12/2019   Lab Results  Component Value Date   HGBA1C 4.6 (L) 10/12/2019   HGBA1C 4.8 05/04/2019   HGBA1C 5.1 11/03/2018   HGBA1C 5.4 08/02/2018   HGBA1C 6.9 (H) 04/27/2018   Lab Results  Component Value Date   INSULIN 3.5 10/12/2019   INSULIN 6.9 08/02/2018   INSULIN 7.0 04/27/2018   Lab Results  Component Value Date   TSH 2.100 05/04/2019   Lab Results  Component Value Date   CHOL 157 10/12/2019   HDL 79 10/12/2019   LDLCALC 65 10/12/2019   TRIG 68 10/12/2019   CHOLHDL 2.7 04/09/2016   Lab Results  Component Value Date   WBC 8.0 06/04/2019   HGB 13.6 06/04/2019   HCT 42.2 06/04/2019   MCV 88.5 06/04/2019   PLT 244 06/04/2019   Lab Results  Component Value Date   IRON 28 11/03/2018   TIBC 335 11/03/2018   FERRITIN 34 11/03/2018    Obesity Behavioral Intervention Documentation for Insurance:   Approximately 15 minutes were spent on the discussion below.  ASK: We discussed the diagnosis of obesity with [name] today and Gabriel agreed to give [place] permission to discuss obesity behavioral modification therapy today.  ASSESS: Kaylynne has the diagnosis of obesity and her BMI today is 26.93. Devony is in the action stage of change.   ADVISE: Wilena was educated on the multiple health risks of obesity as well as the benefit of weight loss to improve  her health. She was advised of the need for long term treatment and the importance of lifestyle modifications to improve her current health and to decrease her risk of future health problems.  AGREE: Multiple dietary modification options and treatment options were discussed and Malayia agreed to follow the recommendations documented in the above note.  ARRANGE: Lorette was educated on the importance of frequent visits to treat obesity as outlined per CMS and USPSTF guidelines and agreed to schedule her next follow up appointment today.  Attestation Statements:   Reviewed by clinician on day of visit: allergies, medications, problem list, medical history, surgical history, family history, social history, and previous encounter notes.   [name], am acting as Location manager for [name], FNP-C.  I have reviewed the above documentation for accuracy and completeness, and I agree with the above. -  Georgianne Fick, FNP

## 2020-03-12 NOTE — Progress Notes (Signed)
  Office: [phone]  /  Fax: [phone]    Date: March 26, 2020   Appointment Start Time: 10:03am Duration: 26 minutes Provider: Glennie Isle, Psy.D. Type of Session: Individual Therapy  Location of Patient: Home Location of Provider: [facility] Type of Contact: Telepsychological Visit via MyChart Video Visit  Session Content: This provider called [name] [name] at 10:02am as she did not present for the telepsychological appointment. She explained she thought the appointment was via FaceTime, adding she can join. As such, today's appointment was initiated 3 minutes late. [name] [name] is a 69 y.o. female presenting via [place] Visit for a follow-up appointment to address the previously established treatment goal of increasing coping skills. Today's appointment was a telepsychological visit due to COVID-19. [name] [name] provided verbal consent for today's telepsychological appointment and she is aware she is responsible for securing confidentiality on her end of the session. Prior to proceeding with today's appointment, [name] [name]'s physical location at the time of this appointment was obtained as well a phone number she could be reached at in the event of technical difficulties. [name] [name] and this provider participated in today's telepsychological service.   This provider conducted a brief check-in. [name] [name] reported taking Ozempic appears to help for one day, but does not feel as helpful as days progress. She discussed ongoing frustration; however, discussed engaging in pleasurable activities. Regarding eating, [name] [name] discussed deviations on her birthday, adding deviations continued after. Psychoeducation regarding all or nothing thinking was provided. Moreover, psychoeducation regarding making better choices and engaging in portion control during celebrations was provided as she shared about an upcoming trip/wedding. More specifically, this provider discussed the following strategies: coming to  celebration meals hungry, but not starving; avoid filling up on appetizers; managing portion sizes; not completely depriving yourself; making the plate colorful (e.g., vegetables); pacing yourself (e.g., waiting 10 minutes before going back for seconds); taking advantage of the nutritious foods; and staying hydrated. [name] [name] was receptive to today's appointment as evidenced by openness to sharing, responsiveness to feedback, and willingness to implement discussed strategies .  Mental Status Examination:  Appearance: well groomed and appropriate hygiene  Behavior: appropriate to circumstances Mood: euthymic Affect: mood congruent Speech: normal in rate, volume, and tone Eye Contact: appropriate Psychomotor Activity: appropriate Gait: unable to assess Thought Process: linear, logical, and goal directed  Thought Content/Perception: no hallucinations, delusions, bizarre thinking or behavior reported or observed and no evidence of suicidal and homicidal ideation, plan, and intent Orientation: time, person, place, and purpose of appointment Memory/Concentration: memory, attention, language, and fund of knowledge intact  Insight/Judgment: fair  Interventions:  Conducted a brief chart review Provided empathic reflections and validation Employed supportive psychotherapy interventions to facilitate reduced distress and to improve coping skills with identified stressors Employed motivational interviewing skills to assess patient's willingness/desire to adhere to recommended medical treatments and assignments Psychoeducation provided regarding all-or-nothing thinking  DSM-5 Diagnosis(es): 311 (F32.8) Other Specified Depressive Disorder, Emotional Eating Behaviors  Treatment Goal & Progress: During the initial appointment with this provider, the following treatment goal was established: increase coping skills. Progress is limited, as [name] [name] has just begun treatment with this provider; however, she is  receptive to the interaction and interventions and rapport is being established.   Plan: The next appointment will be scheduled in two weeks, which will be in person.The next session will focus on working towards the established treatment goal.

## 2020-03-21 ENCOUNTER — Encounter (INDEPENDENT_AMBULATORY_CARE_PROVIDER_SITE_OTHER): Payer: Self-pay | Admitting: Family Medicine

## 2020-03-21 ENCOUNTER — Other Ambulatory Visit: Payer: Self-pay

## 2020-03-21 ENCOUNTER — Ambulatory Visit (INDEPENDENT_AMBULATORY_CARE_PROVIDER_SITE_OTHER): Payer: Medicare Other | Admitting: Family Medicine

## 2020-03-21 VITALS — BP 110/68 | HR 67 | Temp 97.7°F | Ht 63.0 in | Wt 152.0 lb

## 2020-03-21 DIAGNOSIS — E669 Obesity, unspecified: Secondary | ICD-10-CM | POA: Diagnosis not present

## 2020-03-21 DIAGNOSIS — F3289 Other specified depressive episodes: Secondary | ICD-10-CM | POA: Diagnosis not present

## 2020-03-21 DIAGNOSIS — E1169 Type 2 diabetes mellitus with other specified complication: Secondary | ICD-10-CM | POA: Diagnosis not present

## 2020-03-21 DIAGNOSIS — Z6830 Body mass index (BMI) 30.0-30.9, adult: Secondary | ICD-10-CM

## 2020-03-21 NOTE — Progress Notes (Deleted)
Patient is a 69 year old female here for follow-up after undergoing abdominoplasty on 01/08/2020 with Dr. Marla Roe.

## 2020-03-22 ENCOUNTER — Ambulatory Visit: Payer: Medicare Other | Admitting: Surgical

## 2020-03-26 ENCOUNTER — Other Ambulatory Visit: Payer: Self-pay

## 2020-03-26 ENCOUNTER — Telehealth (INDEPENDENT_AMBULATORY_CARE_PROVIDER_SITE_OTHER): Payer: Medicare Other | Admitting: Psychology

## 2020-03-26 DIAGNOSIS — F3289 Other specified depressive episodes: Secondary | ICD-10-CM

## 2020-03-26 NOTE — Progress Notes (Signed)
Office: [phone]  /  Fax: [phone]    Date: April 09, 2020   Time Seen: 4:30pm Duration: 33 minutes Provider: Glennie Isle, Psy.D. Type of Session: Individual Therapy  Type of Contact: Face-to-face  Informed Consent for In-Person Services During COVID-19: During today's appointment, information about the decision to initiate in-person services in light of the GNFAO-13 public health crisis was discussed. [name] [name] and this provider agreed to meet in person for some or all future appointments. If there is a resurgence of the pandemic or other health concerns arise, telepsychological services may be initiated and any related concerns will be discussed and an attempt to address them will be made. [name] verbally acknowledged understanding that if necessary, this provider may determine there is a need to initiate telepsychological services for everyone's well-being. [name] expressed understanding she may request to initiate telepsychological services, and that request will be respected as long as it is feasible and clinically appropriate. Regarding telepsychological services, [name] acknowledged she is ultimately responsible for understanding her insurance benefits as it relates to reimbursement of telepsychological services. Moreover, the risks for opting for in-person services was discussed. [name] verbally acknowledged understanding that by coming to the office, she is assuming the risk of exposure to the coronavirus or other public risk. To obtain in-person services, [name] verbally agreed to taking certain precautions set forth by [facility] to keep everyone safe from exposure, sickness, and possible death. This information was shared by front desk staff either at the time of scheduling and/or during the check-in process. Jaskiran expressed understanding that should she not adhere to these safeguards, it may result in starting/returning to a telepsychological service arrangement and/or the  exploration of other options for treatment. [name] acknowledged understanding that [facility] will follow the protocol set forth by [facility] should a patient present with a fever or other symptoms or disclose recent exposure, which will include rescheduling the appointment. Furthermore, [name] acknowledged understanding that precautions may change if additional local, state or federal orders or guidelines are published. To avoid handling of paper/writing instruments and increasing likelihood of touching, verbal consent was obtained by [name] [name] during today's appointment prior to proceeding. [name] provided verbal consent to proceed, and acknowledged understanding that by verbally consenting to proceed, she is agreeable to all information noted above.   Session Content: [name] [name] is a 69 y.o. female presenting for a follow-up appointment to address the previously established treatment goal of increasing coping skills. The session was initiated with a brief check-in. [name] [name] stated she feels Ozempic was not helping as much as she anticipated, adding the dose was increased. Psychoeducation regarding triggers for emotional eating was provided. [name] [name] was provided a handout, and encouraged to utilize the handout between now and the next appointment to increase awareness of triggers and frequency. [name] [name] agreed. This provider also discussed behavioral strategies for specific triggers, such as placing the utensil down when conversing to avoid mindless eating. Additionally, all or nothing thinking was reviewed. [name] [name] was receptive to today's appointment as evidenced by openness to sharing, responsiveness to feedback, and willingness to explore triggers for emotional eating.  Mental Status Examination:  Appearance: well groomed and appropriate hygiene  Behavior: appropriate to circumstances Mood: euthymic Affect: mood congruent Speech: normal in rate, volume, and tone Eye Contact:  appropriate Psychomotor Activity: appropriate Gait: normal Thought Process: linear, logical, and goal directed  Thought Content/Perception: no hallucinations, delusions, bizarre thinking or behavior reported or observed and no evidence of suicidal and homicidal ideation, plan, and intent Orientation:  time, person, place, and purpose of appointment Memory/Concentration: memory, attention, language, and fund of knowledge intact  Insight/Judgment: fair  Interventions:  Conducted a brief chart review Provided empathic reflections and validation Employed supportive psychotherapy interventions to facilitate reduced distress and to improve coping skills with identified stressors Psychoeducation provided regarding triggers for emotional eating  DSM-5 Diagnosis: 311 (F32.8) Other Specified Depressive Disorder, Emotional Eating Behaviors  Treatment Goal & Progress: During the initial appointment with this provider, the following treatment goal was established: increase coping skills. [name] [name] has demonstrated progress in her goal as evidenced by increased awareness of hunger patterns.   Plan: The next appointment will be scheduled in three weeks. The next session will focus on working towards the established treatment goal.

## 2020-03-27 ENCOUNTER — Encounter (INDEPENDENT_AMBULATORY_CARE_PROVIDER_SITE_OTHER): Payer: Self-pay | Admitting: Family Medicine

## 2020-03-27 NOTE — Progress Notes (Addendum)
Chief Complaint:   OBESITY [name] [name] is here to discuss her progress with her obesity treatment plan along with follow-up of her obesity related diagnoses. [name] [name] is on the Category 2 Plan and states she is following her eating plan approximately 75% of the time. [name] [name] states she is doing cardio, and strength training for 60 minutes 5 times per week.  Today's visit was #: 83 Starting weight: 258 lbs Starting date: 04/27/2018 Today's weight: 152 lbs Today's date: 03/21/2020 Total lbs lost to date: 106 Total lbs lost since last in-office visit: 0  Interim History: [name] [name] has been somewhat off the plan the last few weeks. Her goal weight is 145 lbs. She will be traveling for vacation next week.  Subjective:   1. Type 2 diabetes mellitus without complication, without long-term current use of insulin (HCC) [name] [name] was started on Ozempic at her last office visit. She notes it may possibly be helping with hunger. She denies nausea, though she did not tolerate [place] in past due to nausea.  Lab Results  Component Value Date   HGBA1C 4.6 (L) 10/12/2019   HGBA1C 4.8 05/04/2019   HGBA1C 5.1 11/03/2018   Lab Results  Component Value Date   LDLCALC 65 10/12/2019   CREATININE 0.80 10/12/2019   Lab Results  Component Value Date   INSULIN 3.5 10/12/2019   INSULIN 6.9 08/02/2018   INSULIN 7.0 04/27/2018   2. Other depression with emotional eating  She notes significant emotional eating. [name] [name] had her first visit with Dr. Mallie Mussel recently.  She notes overeating she is triggered by anger and frustration.   Assessment/Plan:   1. Type 2 diabetes mellitus without complication, without long-term current use of insulin (HCC) Well controlled. [name] [name] will continue Ozempic and we will recheck labs at her next office visit. Lab Results  Component Value Date   HGBA1C 4.6 (L) 10/12/2019   2. Other depression with emotional eating  Behavior modification techniques were discussed today to help  [name] [name] deal with her emotional/non-hunger eating behaviors. [name] [name] will continue to see Dr. Mallie Mussel. Orders and follow up as documented in patient record.   3. Class 1 obesity with serious comorbidity and body mass index (BMI) of 30.0 to 30.9 in adult, unspecified obesity type [name] [name] is currently in the action stage of change. As such, her goal is to continue with weight loss efforts. She has agreed to the Category 2 Plan + 300 calories.    We will check full panel of labs at her next office visit.  Exercise goals: As is.  Behavioral modification strategies: increasing lean protein intake and decreasing simple carbohydrates.  [name] [name] has agreed to follow-up with our clinic in 2 weeks. She was informed of the importance of frequent follow-up visits to maximize her success with intensive lifestyle modifications for her multiple health conditions.   Objective:   Blood pressure 110/68, pulse 67, temperature 97.7 F (36.5 C), temperature source Oral, height _0  (1.6 m), weight 152 lb (68.9 kg), SpO2 97 %. Body mass index is 26.93 kg/m.  General: Cooperative, alert, well developed, in no acute distress. HEENT: Conjunctivae and lids unremarkable. Cardiovascular: Regular rhythm.  Lungs: Normal work of breathing. Neurologic: No focal deficits.   Lab Results  Component Value Date   CREATININE 0.80 10/12/2019   BUN 15 10/12/2019   NA 139 10/12/2019   K 4.2 10/12/2019   CL 100 10/12/2019   CO2 26 10/12/2019   Lab Results  Component Value Date   ALT 19 10/12/2019  AST 26 10/12/2019   ALKPHOS 69 10/12/2019   BILITOT 0.3 10/12/2019   Lab Results  Component Value Date   HGBA1C 4.6 (L) 10/12/2019   HGBA1C 4.8 05/04/2019   HGBA1C 5.1 11/03/2018   HGBA1C 5.4 08/02/2018   HGBA1C 6.9 (H) 04/27/2018   Lab Results  Component Value Date   INSULIN 3.5 10/12/2019   INSULIN 6.9 08/02/2018   INSULIN 7.0 04/27/2018   Lab Results  Component Value Date   TSH 2.100 05/04/2019   Lab  Results  Component Value Date   CHOL 157 10/12/2019   HDL 79 10/12/2019   LDLCALC 65 10/12/2019   TRIG 68 10/12/2019   CHOLHDL 2.7 04/09/2016   Lab Results  Component Value Date   WBC 8.0 06/04/2019   HGB 13.6 06/04/2019   HCT 42.2 06/04/2019   MCV 88.5 06/04/2019   PLT 244 06/04/2019   Lab Results  Component Value Date   IRON 28 11/03/2018   TIBC 335 11/03/2018   FERRITIN 34 11/03/2018    Obesity Behavioral Intervention Documentation for Insurance:   Approximately 15 minutes were spent on the discussion below.  ASK: We discussed the diagnosis of obesity with [name] [name] today and [name] [name] agreed to give [place] permission to discuss obesity behavioral modification therapy today.  ASSESS: [name] [name] has the diagnosis of obesity and her BMI today is 26.93. [name] [name] is in the action stage of change.   ADVISE: [name] [name] was educated on the multiple health risks of obesity as well as the benefit of weight loss to improve her health. She was advised of the need for long term treatment and the importance of lifestyle modifications to improve her current health and to decrease her risk of future health problems.  AGREE: Multiple dietary modification options and treatment options were discussed and [name] [name] agreed to follow the recommendations documented in the above note.  ARRANGE: Lizza was educated on the importance of frequent visits to treat obesity as outlined per CMS and USPSTF guidelines and agreed to schedule her next follow up appointment today.  Attestation Statements:   Reviewed by clinician on day of visit: allergies, medications, problem list, medical history, surgical history, family history, social history, and previous encounter notes.   [name], am acting as Location manager for [name], FNP-C.  I have reviewed the above documentation for accuracy and completeness, and I agree with the above. -  Georgianne Fick, FNP

## 2020-04-01 ENCOUNTER — Telehealth: Payer: Self-pay

## 2020-04-01 NOTE — Telephone Encounter (Signed)
I told the pt I would order her a return kit

## 2020-04-02 ENCOUNTER — Other Ambulatory Visit: Payer: Self-pay | Admitting: Plastic Surgery

## 2020-04-02 DIAGNOSIS — Z1231 Encounter for screening mammogram for malignant neoplasm of breast: Secondary | ICD-10-CM

## 2020-04-02 NOTE — Telephone Encounter (Signed)
I ordered the return kit and called the pt to let her know.

## 2020-04-03 ENCOUNTER — Encounter: Payer: Self-pay | Admitting: *Deleted

## 2020-04-03 ENCOUNTER — Other Ambulatory Visit: Payer: Self-pay

## 2020-04-03 ENCOUNTER — Ambulatory Visit (INDEPENDENT_AMBULATORY_CARE_PROVIDER_SITE_OTHER): Payer: Medicare Other | Admitting: Surgical

## 2020-04-03 ENCOUNTER — Encounter: Payer: Self-pay | Admitting: Surgical

## 2020-04-03 VITALS — BP 126/75 | HR 69 | Temp 98.0°F

## 2020-04-03 DIAGNOSIS — M793 Panniculitis, unspecified: Secondary | ICD-10-CM

## 2020-04-03 NOTE — Progress Notes (Signed)
   Subjective:     Patient ID: [name] [name], female    DOB: [birth year], 69 y.o.   MRN: [id number]  Chief Complaint  Patient presents with  . Follow-up    HPI: The patient is a 69 y.o. female here for follow-up after abdominoplasty on 01/08/2020 with Dr. Marla Roe.  Patient's incision has completely healed. She feels as if the majority of her lower extremity swelling has improved with the exception of her feet.  Swelling of her lower abdomen has improved. No f/c/n/v.  Review of Systems  Constitutional: Negative.   Gastrointestinal: Negative for abdominal distention, abdominal pain, nausea and vomiting.  Skin: Negative.      Objective:   Vital Signs BP 126/75 (BP Location: Right Arm, Patient Position: Sitting, Cuff Size: Normal)   Pulse 69   Temp 98 F (36.7 C) (Oral)   SpO2 100%  Vital Signs and Nursing Note Reviewed Chaperone present Physical Exam Constitutional:      Appearance: Normal appearance.  HENT:     Head: Normocephalic and atraumatic.  Cardiovascular:     Rate and Rhythm: Normal rate.  Pulmonary:     Effort: Pulmonary effort is normal.  Abdominal:       Comments: No abdominal wounds noted, no swelling of lower abdomen noted, incision is C/C/I.  No erythema.  No drainage.  No tenderness palpation.  No fat necrosis noted.  Potentially some mild/minimal swelling of the suprapubic area/mons.  Skin:    General: Skin is warm and dry.  Neurological:     General: No focal deficit present.     Mental Status: She is alert and oriented to person, place, and time.  Psychiatric:        Mood and Affect: Mood normal.        Behavior: Behavior normal.     Assessment/Plan:     ICD-10-CM   1. Panniculitis  M79.3     Overall [name] [name] is doing well, wounds have entirely healed and there is no sign of any infection, seroma, hematoma.  There is some question whether she has a little bit of swelling of the mons/suprapubic area.  This is more likely adipose/excess  skin as I do not feel any specific fluid collection.   Discussed with patient she can continue to wear compression garment as needed.  Recommend calling with questions or concerns.  Follow-up as needed with a scheduled follow-up in 1 year.  Discussed with patient if she has any questions or concerns to please call and I can see her again sooner.  Pictures were obtained of the patient and placed in the chart with the patient's or guardian's permission.   Carola Rhine Ryott Rafferty, PA-C 04/03/2020, 2:30 PM

## 2020-04-04 ENCOUNTER — Other Ambulatory Visit (INDEPENDENT_AMBULATORY_CARE_PROVIDER_SITE_OTHER): Payer: Self-pay | Admitting: Family Medicine

## 2020-04-04 ENCOUNTER — Encounter (INDEPENDENT_AMBULATORY_CARE_PROVIDER_SITE_OTHER): Payer: Self-pay | Admitting: Family Medicine

## 2020-04-04 ENCOUNTER — Ambulatory Visit (INDEPENDENT_AMBULATORY_CARE_PROVIDER_SITE_OTHER): Payer: Medicare Other | Admitting: Family Medicine

## 2020-04-04 VITALS — BP 134/75 | HR 63 | Temp 97.6°F | Ht 63.0 in | Wt 156.0 lb

## 2020-04-04 DIAGNOSIS — Z6830 Body mass index (BMI) 30.0-30.9, adult: Secondary | ICD-10-CM | POA: Diagnosis not present

## 2020-04-04 DIAGNOSIS — E1169 Type 2 diabetes mellitus with other specified complication: Secondary | ICD-10-CM | POA: Diagnosis not present

## 2020-04-04 DIAGNOSIS — E785 Hyperlipidemia, unspecified: Secondary | ICD-10-CM | POA: Diagnosis not present

## 2020-04-04 DIAGNOSIS — E559 Vitamin D deficiency, unspecified: Secondary | ICD-10-CM | POA: Diagnosis not present

## 2020-04-04 DIAGNOSIS — E669 Obesity, unspecified: Secondary | ICD-10-CM

## 2020-04-04 DIAGNOSIS — D508 Other iron deficiency anemias: Secondary | ICD-10-CM

## 2020-04-04 DIAGNOSIS — E538 Deficiency of other specified B group vitamins: Secondary | ICD-10-CM | POA: Diagnosis not present

## 2020-04-04 MED ORDER — OZEMPIC (0.25 OR 0.5 MG/DOSE) 2 MG/1.5ML ~~LOC~~ SOPN: 0.5000 mg | PEN_INJECTOR | SUBCUTANEOUS | 0 refills | Status: DC

## 2020-04-04 NOTE — Telephone Encounter (Signed)
Patient picked up letter.

## 2020-04-05 LAB — CBC WITH DIFFERENTIAL/PLATELET
Basophils Absolute: 0 10*3/uL (ref 0.0–0.2)
Basos: 1 %
EOS (ABSOLUTE): 0.1 10*3/uL (ref 0.0–0.4)
Eos: 2 %
Hematocrit: 42.1 % (ref 34.0–46.6)
Hemoglobin: 13.9 g/dL (ref 11.1–15.9)
Immature Grans (Abs): 0 10*3/uL (ref 0.0–0.1)
Immature Granulocytes: 0 %
Lymphocytes Absolute: 1.1 10*3/uL (ref 0.7–3.1)
Lymphs: 23 %
MCH: 29.1 pg (ref 26.6–33.0)
MCHC: 33 g/dL (ref 31.5–35.7)
MCV: 88 fL (ref 79–97)
Monocytes Absolute: 0.4 10*3/uL (ref 0.1–0.9)
Monocytes: 9 %
Neutrophils Absolute: 3.1 10*3/uL (ref 1.4–7.0)
Neutrophils: 65 %
Platelets: 218 10*3/uL (ref 150–450)
RBC: 4.77 x10E6/uL (ref 3.77–5.28)
RDW: 13.1 % (ref 11.7–15.4)
WBC: 4.8 10*3/uL (ref 3.4–10.8)

## 2020-04-05 LAB — INSULIN, RANDOM: INSULIN: 4 u[IU]/mL (ref 2.6–24.9)

## 2020-04-05 LAB — COMPREHENSIVE METABOLIC PANEL
ALT: 21 IU/L (ref 0–32)
AST: 27 IU/L (ref 0–40)
Albumin/Globulin Ratio: 2.1 (ref 1.2–2.2)
Albumin: 4.4 g/dL (ref 3.8–4.8)
Alkaline Phosphatase: 73 IU/L (ref 48–121)
BUN/Creatinine Ratio: 18 (ref 12–28)
BUN: 14 mg/dL (ref 8–27)
Bilirubin Total: 0.3 mg/dL (ref 0.0–1.2)
CO2: 26 mmol/L (ref 20–29)
Calcium: 9.5 mg/dL (ref 8.7–10.3)
Chloride: 94 mmol/L — ABNORMAL LOW (ref 96–106)
Creatinine, Ser: 0.76 mg/dL (ref 0.57–1.00)
GFR calc Af Amer: 93 mL/min/{1.73_m2} (ref >59)
GFR calc non Af Amer: 80 mL/min/{1.73_m2} (ref >59)
Globulin, Total: 2.1 g/dL (ref 1.5–4.5)
Glucose: 73 mg/dL (ref 65–99)
Potassium: 4.7 mmol/L (ref 3.5–5.2)
Sodium: 135 mmol/L (ref 134–144)
Total Protein: 6.5 g/dL (ref 6.0–8.5)

## 2020-04-05 LAB — LIPID PANEL WITH LDL/HDL RATIO
Cholesterol, Total: 174 mg/dL (ref 100–199)
HDL: 85 mg/dL (ref >39)
LDL Chol Calc (NIH): 77 mg/dL (ref 0–99)
LDL/HDL Ratio: 0.9 ratio (ref 0.0–3.2)
Triglycerides: 63 mg/dL (ref 0–149)
VLDL Cholesterol Cal: 12 mg/dL (ref 5–40)

## 2020-04-05 LAB — HEMOGLOBIN A1C
Est. average glucose Bld gHb Est-mCnc: 91 mg/dL
Hgb A1c MFr Bld: 4.8 % (ref 4.8–5.6)

## 2020-04-05 LAB — ANEMIA PANEL
Ferritin: 75 ng/mL (ref 15–150)
Folate, Hemolysate: 281 ng/mL
Folate, RBC: 486 ng/mL — ABNORMAL LOW (ref >498)
Hematocrit: 57.8 % — ABNORMAL HIGH (ref 34.0–46.6)
Iron Saturation: 28 % (ref 15–55)
Iron: 79 ug/dL (ref 27–139)
Retic Ct Pct: 1.1 % (ref 0.6–2.6)
Total Iron Binding Capacity: 278 ug/dL (ref 250–450)
UIBC: 199 ug/dL (ref 118–369)
Vitamin B-12: 493 pg/mL (ref 232–1245)

## 2020-04-05 LAB — VITAMIN D 25 HYDROXY (VIT D DEFICIENCY, FRACTURES): Vit D, 25-Hydroxy: 40.3 ng/mL (ref 30.0–100.0)

## 2020-04-09 ENCOUNTER — Other Ambulatory Visit: Payer: Self-pay

## 2020-04-09 ENCOUNTER — Ambulatory Visit (INDEPENDENT_AMBULATORY_CARE_PROVIDER_SITE_OTHER): Payer: Medicare Other | Admitting: Psychology

## 2020-04-09 DIAGNOSIS — F3289 Other specified depressive episodes: Secondary | ICD-10-CM | POA: Diagnosis not present

## 2020-04-10 ENCOUNTER — Encounter (INDEPENDENT_AMBULATORY_CARE_PROVIDER_SITE_OTHER): Payer: Self-pay | Admitting: Family Medicine

## 2020-04-10 DIAGNOSIS — E1169 Type 2 diabetes mellitus with other specified complication: Secondary | ICD-10-CM | POA: Insufficient documentation

## 2020-04-10 DIAGNOSIS — E785 Hyperlipidemia, unspecified: Secondary | ICD-10-CM | POA: Insufficient documentation

## 2020-04-10 NOTE — Progress Notes (Signed)
Chief Complaint:   OBESITY Patsye is here to discuss her progress with her obesity treatment plan along with follow-up of her obesity related diagnoses. Filicia is on the Category 2 Plan + 300 calories and states she is following her eating plan approximately 50% of the time. Karielle states she is doing cardio, and weight machine for 60 minutes 3-4 times per week.  Today's visit was #: 65 Starting weight: 258 lbs Starting date: 04/27/2018 Today's weight: 156 lbs Today's date: 04/04/2020 Total lbs lost to date: 102 Total lbs lost since last in-office visit: 0  Interim History: Denai's goal weight is 150-155 lbs. She notes she has been off the plan to a great extent.  Subjective:   1. Type 2 diabetes mellitus with other specified complication, without long-term current use of insulin (HCC) Serayah notes polyphagia throughout the day. She is on Ozempic.  Lab Results  Component Value Date   HGBA1C 4.8 04/04/2020   HGBA1C 4.6 (L) 10/12/2019   HGBA1C 4.8 05/04/2019   Lab Results  Component Value Date   LDLCALC 77 04/04/2020   CREATININE 0.76 04/04/2020   Lab Results  Component Value Date   INSULIN 4.0 04/04/2020   INSULIN 3.5 10/12/2019   INSULIN 6.9 08/02/2018   INSULIN 7.0 04/27/2018   2. Hyperlipidemia associated with type 2 diabetes mellitus ([name]) Deari has hyperlipidemia and has been trying to improve her cholesterol levels with intensive lifestyle modification including a low saturated fat diet, exercise and weight loss. Last FLP was within normal limits. She is on Crestor 20 mg. She denies any chest pain, claudication or myalgias.  Lab Results  Component Value Date   ALT 21 04/04/2020   AST 27 04/04/2020   ALKPHOS 73 04/04/2020   BILITOT 0.3 04/04/2020   Lab Results  Component Value Date   CHOL 174 04/04/2020   HDL 85 04/04/2020   LDLCALC 77 04/04/2020   TRIG 63 04/04/2020   CHOLHDL 2.7 04/09/2016   3. Other iron deficiency anemia Rosy is on 325 mg of  iron twice daily. Last iron saturation was 8 on 11/03/2018.  4. Vitamin D deficiency Nykiah is on OTC Vit D3 5,000 IU daily. Last Vit D level was nearly at goal at 48.3. She denies fatigue.  Assessment/Plan:   1. Type 2 diabetes mellitus with other specified complication, without long-term current use of insulin (HCC) Good blood sugar control is important to decrease the likelihood of diabetic complications such as nephropathy, neuropathy, limb loss, blindness, coronary artery disease, and death. Intensive lifestyle modification including diet, exercise and weight loss are the first line of treatment for diabetes. Glady agreed to increase Ozempic to 0.5 mg and we will refill for 1 month. We will check labs today.  - Comprehensive metabolic panel - Hemoglobin A1c - Insulin, random - Microalbumin, urine  - Semaglutide,0.25 or 0.5MG/DOS, (OZEMPIC, 0.25 OR 0.5 MG/DOSE,) 2 MG/1.5ML SOPN; Inject 0.375 mLs (0.5 mg total) into the skin once a week.  Dispense: 1 pen; Refill: 0  2. Hyperlipidemia associated with type 2 diabetes mellitus ([place]) Cardiovascular risk and specific lipid/LDL goals reviewed. We discussed several lifestyle modifications today. Traci will continue to work on diet, exercise and weight loss efforts. We will check labs today.  - Lipid Panel With LDL/HDL Ratio  3. Other iron deficiency anemia We will check labs today. Quincee will continue to follow up as directed.   - CBC with Differential/Platelet - Anemia panel  4. Vitamin D deficiency Low Vitamin D level  contributes to fatigue and are associated with obesity, breast, and colon cancer. Tuesday agreed to continue taking prescription Vitamin D3 5,000 IU daily. She will follow-up for routine testing of Vitamin D, at least 2-3 times per year to avoid over-replacement. We will check labs today.  - VITAMIN D 25 Hydroxy (Vit-D Deficiency, Fractures)  5. Class 1 obesity with serious comorbidity and body mass index (BMI) of 30.0 to  30.9 in adult, unspecified obesity type Eulene is currently in the action stage of change. As such, her goal is to continue with weight loss efforts. She has agreed to the Category 2 Plan.   Exercise goals: As is.  Behavioral modification strategies: decreasing simple carbohydrates, increasing water intake and planning for success.  Hagen has agreed to follow-up with our clinic in 2 weeks. She was informed of the importance of frequent follow-up visits to maximize her success with intensive lifestyle modifications for her multiple health conditions.   Taronda was informed we would discuss her lab results at her next visit unless there is a critical issue that needs to be addressed sooner. Lilli agreed to keep her next visit at the agreed upon time to discuss these results.  Objective:   Blood pressure 134/75, pulse 63, temperature 97.6 F (36.4 C), temperature source Oral, height _0  (1.6 m), weight 156 lb (70.8 kg), SpO2 98 %. Body mass index is 27.63 kg/m.  General: Cooperative, alert, well developed, in no acute distress. HEENT: Conjunctivae and lids unremarkable. Cardiovascular: Regular rhythm.  Lungs: Normal work of breathing. Neurologic: No focal deficits.   Lab Results  Component Value Date   CREATININE 0.76 04/04/2020   BUN 14 04/04/2020   NA 135 04/04/2020   K 4.7 04/04/2020   CL 94 (L) 04/04/2020   CO2 26 04/04/2020   Lab Results  Component Value Date   ALT 21 04/04/2020   AST 27 04/04/2020   ALKPHOS 73 04/04/2020   BILITOT 0.3 04/04/2020   Lab Results  Component Value Date   HGBA1C 4.8 04/04/2020   HGBA1C 4.6 (L) 10/12/2019   HGBA1C 4.8 05/04/2019   HGBA1C 5.1 11/03/2018   HGBA1C 5.4 08/02/2018   Lab Results  Component Value Date   INSULIN 4.0 04/04/2020   INSULIN 3.5 10/12/2019   INSULIN 6.9 08/02/2018   INSULIN 7.0 04/27/2018   Lab Results  Component Value Date   TSH 2.100 05/04/2019   Lab Results  Component Value Date   CHOL 174 04/04/2020    HDL 85 04/04/2020   LDLCALC 77 04/04/2020   TRIG 63 04/04/2020   CHOLHDL 2.7 04/09/2016   Lab Results  Component Value Date   WBC 4.8 04/04/2020   HGB 13.9 04/04/2020   HCT 57.8 (H) 04/04/2020   MCV 88 04/04/2020   PLT 218 04/04/2020   Lab Results  Component Value Date   IRON 79 04/04/2020   TIBC 278 04/04/2020   FERRITIN 75 04/04/2020    Obesity Behavioral Intervention Documentation for Insurance:   Approximately 15 minutes were spent on the discussion below.  ASK: We discussed the diagnosis of obesity with [name] today and Syrina agreed to give [place] permission to discuss obesity behavioral modification therapy today.  ASSESS: Yexalen has the diagnosis of obesity and her BMI today is 27.64. Ashton is in the action stage of change.   ADVISE: Yaret was educated on the multiple health risks of obesity as well as the benefit of weight loss to improve her health. She was advised of the need for  long term treatment and the importance of lifestyle modifications to improve her current health and to decrease her risk of future health problems.  AGREE: Multiple dietary modification options and treatment options were discussed and Merrick agreed to follow the recommendations documented in the above note.  ARRANGE: Teila was educated on the importance of frequent visits to treat obesity as outlined per CMS and USPSTF guidelines and agreed to schedule her next follow up appointment today.  Attestation Statements:   Reviewed by clinician on day of visit: allergies, medications, problem list, medical history, surgical history, family history, social history, and previous encounter notes.   [name], am acting as Location manager for [name], FNP-C.  I have reviewed the above documentation for accuracy and completeness, and I agree with the above. -  Georgianne Fick, FNP

## 2020-04-12 ENCOUNTER — Telehealth: Payer: Self-pay

## 2020-04-12 ENCOUNTER — Telehealth: Payer: Self-pay | Admitting: Cardiology

## 2020-04-12 NOTE — Telephone Encounter (Signed)
I reordered the pt a return kit. She states this is the 3rd time being ordered one. I told her if she do not receive it this time she can bring it to the office and we will mail it back. The pt verbalized understanding.

## 2020-04-12 NOTE — Telephone Encounter (Signed)
Patient is advising that she has not received her return kit for the loop recorder. Connected call to [facility]

## 2020-04-16 NOTE — Progress Notes (Signed)
  Office: [phone]  /  Fax: [phone]    Date: April 30, 2020   Time Seen: 8:28am Duration: 32 minutes Provider: Glennie Isle, Psy.D. Type of Session: Individual Therapy  Type of Contact: Face-to-face  Session Content: [name] [name] is a 69 y.o. female presenting for a follow-up appointment to address the previously established treatment goal of increasing coping skills. The session was initiated with a brief check-in. [name] [name] shared, "Mentally I feel better." She acknowledged deviations in her eating habits, but noted, "I'm at my goal weight." However, she discussed feeling she needs to set a new goal weight for weight loss. This was further explored and it was reflected [name] [name] appears to have a fear of failing with her weight loss journey and worries about her ability to maintain. She agreed and was engaged in thought challenging and reframing. This provider also provided psychoeducation regarding the difference between emotional hunger versus emotional eating. [name] [name] was receptive to today's appointment as evidenced by openness to sharing and responsiveness to feedback.  Mental Status Examination:  Appearance: well groomed and appropriate hygiene  Behavior: appropriate to circumstances Mood: euthymic Affect: mood congruent Speech: normal in rate, volume, and tone Eye Contact: appropriate Psychomotor Activity: appropriate Gait: normal Thought Process: linear, logical, and goal directed  Thought Content/Perception: no hallucinations, delusions, bizarre thinking or behavior reported or observed and no evidence of suicidal and homicidal ideation, plan, and intent Orientation: time, person, place, and purpose of appointment Memory/Concentration: memory, attention, language, and fund of knowledge intact  Insight/Judgment: fair  Interventions:  Conducted a brief chart review Provided empathic reflections and validation Employed supportive psychotherapy interventions to facilitate reduced  distress and to improve coping skills with identified stressors Engaged patient in thought challenging/cognitive restructuring   DSM-5 Diagnosis: 311 (F32.8) Other Specified Depressive Disorder, Emotional Eating Behaviors  Treatment Goal & Progress: During the initial appointment with this provider, the following treatment goal was established: increase coping skills. [name] [name] has demonstrated progress in her goal as evidenced by increased awareness of hunger patterns and increased awareness of triggers for emotional eating. [name] [name] also continues to demonstrate willingness to engage in learned skill(s).  Plan: The next appointment will be scheduled in two weeks. The next session will focus on working towards the established treatment goal.

## 2020-04-18 ENCOUNTER — Encounter (INDEPENDENT_AMBULATORY_CARE_PROVIDER_SITE_OTHER): Payer: Self-pay | Admitting: Family Medicine

## 2020-04-18 ENCOUNTER — Other Ambulatory Visit: Payer: Self-pay

## 2020-04-18 ENCOUNTER — Ambulatory Visit (INDEPENDENT_AMBULATORY_CARE_PROVIDER_SITE_OTHER): Payer: Medicare Other | Admitting: Family Medicine

## 2020-04-18 VITALS — BP 136/75 | HR 62 | Temp 97.8°F | Ht 63.0 in | Wt 151.0 lb

## 2020-04-18 DIAGNOSIS — Z6830 Body mass index (BMI) 30.0-30.9, adult: Secondary | ICD-10-CM

## 2020-04-18 DIAGNOSIS — E559 Vitamin D deficiency, unspecified: Secondary | ICD-10-CM

## 2020-04-18 DIAGNOSIS — D508 Other iron deficiency anemias: Secondary | ICD-10-CM | POA: Diagnosis not present

## 2020-04-18 DIAGNOSIS — E669 Obesity, unspecified: Secondary | ICD-10-CM | POA: Diagnosis not present

## 2020-04-18 DIAGNOSIS — E1169 Type 2 diabetes mellitus with other specified complication: Secondary | ICD-10-CM | POA: Diagnosis not present

## 2020-04-18 MED ORDER — VITAMIN D3 250 MCG (10000 UT) PO CAPS: 10000.0000 [IU] | ORAL_CAPSULE | Freq: Every day | ORAL | 0 refills | Status: DC

## 2020-04-18 NOTE — Progress Notes (Signed)
Chief Complaint:   OBESITY [name] [name] is here to discuss her progress with her obesity treatment plan along with follow-up of her obesity related diagnoses. [name] [name] is on the Category 2 Plan and states she is following her eating plan approximately 70% of the time. [name] [name] states she is doing cardio and strengthening for 30 minutes 5 times per week.  Today's visit was #: 68 Starting weight: 258 lbs Starting date: 04/27/2018 Today's weight: 151 lbs Today's date: 04/18/2020 Total lbs lost to date: 107 Total lbs lost since last in-office visit: 5  Interim History: [name] [name] feels she has been off the plan quite a bit and notes weight loss today must be from loss of fluid. She would like to lose down to 145 lbs.  Subjective:   1. Type 2 diabetes mellitus with other specified complication, without long-term current use of insulin ([place]) [name] [name]'s diabetes mellitus is very well controlled on Ozempic.  Lab Results  Component Value Date   HGBA1C 4.8 04/04/2020   HGBA1C 4.6 (L) 10/12/2019   HGBA1C 4.8 05/04/2019   Lab Results  Component Value Date   LDLCALC 77 04/04/2020   CREATININE 0.76 04/04/2020   Lab Results  Component Value Date   INSULIN 4.0 04/04/2020   INSULIN 3.5 10/12/2019   INSULIN 6.9 08/02/2018   INSULIN 7.0 04/27/2018   2. Other iron deficiency anemia [name] [name] is on iron BID. Last folate was low, and hemoglobin was within normal limits.  CBC Latest Ref Rng & Units 04/04/2020 04/04/2020 06/04/2019  WBC 3.4 - 10.8 x10E3/uL - 4.8 8.0  Hemoglobin 11.1 - 15.9 g/dL - 13.9 13.6  Hematocrit 34.0 - 46.6 % 57.8(H) 42.1 42.2  Platelets 150 - 450 x10E3/uL - 218 244   Lab Results  Component Value Date   IRON 79 04/04/2020   TIBC 278 04/04/2020   FERRITIN 75 04/04/2020   Lab Results  Component Value Date   [id number] 04/04/2020   3. Vitamin D deficiency [name] [name]'s last Vit D level has declined to 40.3 from 48.3. She is on OTC Vit D 5,000 IU daily.  Assessment/Plan:  At  least 15 minutes were spent on discussing the following behavioral intervention visit. 1. Type 2 diabetes mellitus with other specified complication, without long-term current use of insulin (HCC) Good blood sugar control is important to decrease the likelihood of diabetic complications such as nephropathy, neuropathy, limb loss, blindness, coronary artery disease, and death. Intensive lifestyle modification including diet, exercise and weight loss are the first line of treatment for diabetes. [name] [name] will continue Ozempic and will follow up as directed.  2. Other iron deficiency anemia [name] [name] is to decrease iron to once daily, and add folic acid 793 mcg daily.   3. Vitamin D deficiency Low Vitamin D level contributes to fatigue and are associated with obesity, breast, and colon cancer. [name] [name] agreed to increase OTC Vitamin D to 5,000 IU BID, and will follow-up for routine testing of Vitamin D, at least 2-3 times per year to avoid over-replacement.  4. Class 1 obesity with serious comorbidity and body mass index (BMI) of 30.0 to 30.9 in adult, unspecified obesity type [name] [name] is currently in the action stage of change. As such, her goal is to continue with weight loss efforts. She has agreed to the Category 2 Plan.   Exercise goals: As is.  Behavioral modification strategies: decreasing simple carbohydrates.  [name] [name] has agreed to follow-up with our clinic in 2 weeks. She was informed of the importance of frequent follow-up  visits to maximize her success with intensive lifestyle modifications for her multiple health conditions.   Objective:   Blood pressure (!) 136/75, pulse 62, temperature 97.8 F (36.6 C), temperature source Oral, height _0  (1.6 m), weight 151 lb (68.5 kg), SpO2 98 %. Body mass index is 26.75 kg/m.  General: Cooperative, alert, well developed, in no acute distress. HEENT: Conjunctivae and lids unremarkable. Cardiovascular: Regular rhythm.  Lungs: Normal work of  breathing. Neurologic: No focal deficits.   Lab Results  Component Value Date   CREATININE 0.76 04/04/2020   BUN 14 04/04/2020   NA 135 04/04/2020   K 4.7 04/04/2020   CL 94 (L) 04/04/2020   CO2 26 04/04/2020   Lab Results  Component Value Date   ALT 21 04/04/2020   AST 27 04/04/2020   ALKPHOS 73 04/04/2020   BILITOT 0.3 04/04/2020   Lab Results  Component Value Date   HGBA1C 4.8 04/04/2020   HGBA1C 4.6 (L) 10/12/2019   HGBA1C 4.8 05/04/2019   HGBA1C 5.1 11/03/2018   HGBA1C 5.4 08/02/2018   Lab Results  Component Value Date   INSULIN 4.0 04/04/2020   INSULIN 3.5 10/12/2019   INSULIN 6.9 08/02/2018   INSULIN 7.0 04/27/2018   Lab Results  Component Value Date   TSH 2.100 05/04/2019   Lab Results  Component Value Date   CHOL 174 04/04/2020   HDL 85 04/04/2020   LDLCALC 77 04/04/2020   TRIG 63 04/04/2020   CHOLHDL 2.7 04/09/2016   Lab Results  Component Value Date   WBC 4.8 04/04/2020   HGB 13.9 04/04/2020   HCT 57.8 (H) 04/04/2020   MCV 88 04/04/2020   PLT 218 04/04/2020   Lab Results  Component Value Date   IRON 79 04/04/2020   TIBC 278 04/04/2020   FERRITIN 75 04/04/2020   Attestation Statements:   Reviewed by clinician on day of visit: allergies, medications, problem list, medical history, surgical history, family history, social history, and previous encounter notes.  Time spent on visit including pre-visit chart review and post-visit care and charting was 31 minutes.    [name], am acting as Location manager for [name], FNP-C.  I have reviewed the above documentation for accuracy and completeness, and I agree with the above. -  Georgianne Fick, FNP

## 2020-04-30 ENCOUNTER — Other Ambulatory Visit: Payer: Self-pay

## 2020-04-30 ENCOUNTER — Ambulatory Visit (INDEPENDENT_AMBULATORY_CARE_PROVIDER_SITE_OTHER): Payer: Medicare Other | Admitting: Psychology

## 2020-04-30 DIAGNOSIS — F3289 Other specified depressive episodes: Secondary | ICD-10-CM | POA: Diagnosis not present

## 2020-05-09 ENCOUNTER — Other Ambulatory Visit: Payer: Self-pay

## 2020-05-09 ENCOUNTER — Ambulatory Visit (INDEPENDENT_AMBULATORY_CARE_PROVIDER_SITE_OTHER): Payer: Medicare Other | Admitting: Family Medicine

## 2020-05-09 ENCOUNTER — Encounter (INDEPENDENT_AMBULATORY_CARE_PROVIDER_SITE_OTHER): Payer: Self-pay | Admitting: Family Medicine

## 2020-05-09 VITALS — BP 118/68 | HR 62 | Temp 97.8°F | Ht 63.0 in | Wt 153.0 lb

## 2020-05-09 DIAGNOSIS — E1169 Type 2 diabetes mellitus with other specified complication: Secondary | ICD-10-CM

## 2020-05-09 DIAGNOSIS — Z6830 Body mass index (BMI) 30.0-30.9, adult: Secondary | ICD-10-CM

## 2020-05-09 DIAGNOSIS — E669 Obesity, unspecified: Secondary | ICD-10-CM | POA: Diagnosis not present

## 2020-05-09 NOTE — Progress Notes (Signed)
Chief Complaint:   OBESITY [name] [name] is here to discuss her progress with her obesity treatment plan along with follow-up of her obesity related diagnoses. [name] [name] is on the Category 2 Plan and states she is following her eating plan approximately 70% of the time. [name] [name] states she is doing cardio and strengthening for 30-60 minutes 5 times per week.  Today's visit was #: 77 Starting weight: 258 lbs Starting date: 04/27/2018 Today's weight: 153 lbs Today's date: 05/09/2020 Total lbs lost to date: 105 Total lbs lost since last in-office visit: 0  Interim History: [name] [name] notes overdoing carbohydrates over the past few weeks. Her weight goal is 145 lbs, revised from 150-155 lbs at her request.  Subjective:   1. Type 2 diabetes mellitus with other specified complication, without long-term current use of insulin (HCC) [name] [name] stopped the Ozempic secondary to constipation. She did not feel it was helping with her appetite. She was on metformin before Ozempic.  Lab Results  Component Value Date   HGBA1C 4.8 04/04/2020   HGBA1C 4.6 (L) 10/12/2019   HGBA1C 4.8 05/04/2019   Lab Results  Component Value Date   LDLCALC 77 04/04/2020   CREATININE 0.76 04/04/2020   Lab Results  Component Value Date   INSULIN 4.0 04/04/2020   INSULIN 3.5 10/12/2019   INSULIN 6.9 08/02/2018   INSULIN 7.0 04/27/2018   Assessment/Plan:   1. Type 2 diabetes mellitus with other specified complication, without long-term current use of insulin ([name] [name]) Discontinue Ozempic, and she will try to diet control at this time. We will not restart metformin.  2. Class 1 obesity with serious comorbidity and body mass index (BMI) of 30.0 to 30.9 in adult, unspecified obesity type [name] [name] is currently in the action stage of change. As such, her goal is to continue with weight loss efforts. She has agreed to the Category 2 Plan.   Exercise goals: As is.  Behavioral modification strategies: decreasing simple  carbohydrates.  [name] [name] has agreed to follow-up with our clinic in 2 weeks. She was informed of the importance of frequent follow-up visits to maximize her success with intensive lifestyle modifications for her multiple health conditions.   Objective:   Blood pressure 118/68, pulse 62, temperature 97.8 F (36.6 C), temperature source Oral, height _0  (1.6 m), weight 153 lb (69.4 kg), SpO2 98 %. Body mass index is 27.1 kg/m.  General: Cooperative, alert, well developed, in no acute distress. HEENT: Conjunctivae and lids unremarkable. Cardiovascular: Regular rhythm.  Lungs: Normal work of breathing. Neurologic: No focal deficits.   Lab Results  Component Value Date   CREATININE 0.76 04/04/2020   BUN 14 04/04/2020   NA 135 04/04/2020   K 4.7 04/04/2020   CL 94 (L) 04/04/2020   CO2 26 04/04/2020   Lab Results  Component Value Date   ALT 21 04/04/2020   AST 27 04/04/2020   ALKPHOS 73 04/04/2020   BILITOT 0.3 04/04/2020   Lab Results  Component Value Date   HGBA1C 4.8 04/04/2020   HGBA1C 4.6 (L) 10/12/2019   HGBA1C 4.8 05/04/2019   HGBA1C 5.1 11/03/2018   HGBA1C 5.4 08/02/2018   Lab Results  Component Value Date   INSULIN 4.0 04/04/2020   INSULIN 3.5 10/12/2019   INSULIN 6.9 08/02/2018   INSULIN 7.0 04/27/2018   Lab Results  Component Value Date   TSH 2.100 05/04/2019   Lab Results  Component Value Date   CHOL 174 04/04/2020   HDL 85 04/04/2020   LDLCALC 77  04/04/2020   TRIG 63 04/04/2020   CHOLHDL 2.7 04/09/2016   Lab Results  Component Value Date   WBC 4.8 04/04/2020   HGB 13.9 04/04/2020   HCT 57.8 (H) 04/04/2020   MCV 88 04/04/2020   PLT 218 04/04/2020   Lab Results  Component Value Date   IRON 79 04/04/2020   TIBC 278 04/04/2020   FERRITIN 75 04/04/2020    Obesity Behavioral Intervention Documentation for Insurance:   Approximately 15 minutes were spent on the discussion below.  ASK: We discussed the diagnosis of obesity with [name] [name]  today and [name] [name] agreed to give [place] permission to discuss obesity behavioral modification therapy today.  ASSESS: [name] [name] has the diagnosis of obesity and her BMI today is 27.11. [name] [name] is in the action stage of change.   ADVISE: [name] [name] was educated on the multiple health risks of obesity as well as the benefit of weight loss to improve her health. She was advised of the need for long term treatment and the importance of lifestyle modifications to improve her current health and to decrease her risk of future health problems.  AGREE: Multiple dietary modification options and treatment options were discussed and [name] [name] agreed to follow the recommendations documented in the above note.  ARRANGE: [name] [name] was educated on the importance of frequent visits to treat obesity as outlined per CMS and USPSTF guidelines and agreed to schedule her next follow up appointment today.  Attestation Statements:   Reviewed by clinician on day of visit: allergies, medications, problem list, medical history, surgical history, family history, social history, and previous encounter notes.   [name], am acting as Location manager for [name], FNP-C.  I have reviewed the above documentation for accuracy and completeness, and I agree with the above. -  Georgianne Fick, FNP

## 2020-05-14 ENCOUNTER — Ambulatory Visit (INDEPENDENT_AMBULATORY_CARE_PROVIDER_SITE_OTHER): Payer: Self-pay | Admitting: Psychology

## 2020-05-15 NOTE — Progress Notes (Unsigned)
Office: [phone]  /  Fax: [phone]    Date: May 29, 2020   Appointment Start Time: *** Duration: *** minutes Provider: Glennie Isle, Psy.D. Type of Session: Individual Therapy  Location of Patient: {gbptloc:23249} Location of Provider: Provider's Home Type of Contact: Telepsychological Visit via MyChart Video Visit  Session Content: [name] [name] is a 69 y.o. female presenting for a follow-up appointment to address the previously established treatment goal of increasing coping skills. Today's appointment was a telepsychological visit due to COVID-19. [name] provided verbal consent for today's telepsychological appointment and she is aware she is responsible for securing confidentiality on her end of the session. Prior to proceeding with today's appointment, [name] location at the time of this appointment was obtained as well a phone number she could be reached at in the event of technical difficulties. Seraya and this provider participated in today's telepsychological service.   This provider conducted a brief check-in and verbally administered the PHQ-9 and GAD-7. *** [name] was receptive to today's appointment as evidenced by openness to sharing, responsiveness to feedback, and {gbreceptiveness:23401}.  Mental Status Examination:  Appearance: {Appearance:22431} Behavior: {Behavior:22445} Mood: {gbmood:21757} Affect: {Affect:22436} Speech: {Speech:22432} Eye Contact: {Eye Contact:22433} Psychomotor Activity: {Motor Activity:22434} Gait: {gbgait:23404} Thought Process: {thought process:22448}  Thought Content/Perception: {disturbances:22451} Orientation: {Orientation:22437} Memory/Concentration: {gbcognition:22449} Insight/Judgment: {Insight:22446}  Structured Assessments Results: The Patient Health Questionnaire-9 (PHQ-9) is a self-report measure that assesses symptoms and severity of depression over the course of the last two weeks. Rane obtained a score of ***  suggesting {GBPHQ9SEVERITY:21752}. [name] finds the endorsed symptoms to be {gbphq9difficulty:21754}. [0= Not at all; 1= Several days; 2= More than half the days; 3= Nearly every day] Little interest or pleasure in doing things ***  Feeling down, depressed, or hopeless ***  Trouble falling or staying asleep, or sleeping too much ***  Feeling tired or having little energy ***  Poor appetite or overeating ***  Feeling bad about yourself --- or that you are a failure or have let yourself or your family down ***  Trouble concentrating on things, such as reading the newspaper or watching television ***  Moving or speaking so slowly that other people could have noticed? Or the opposite --- being so fidgety or restless that you have been moving around a lot more than usual ***  Thoughts that you would be better off dead or hurting yourself in some way ***  PHQ-9 Score ***    The Generalized Anxiety Disorder-7 (GAD-7) is a brief self-report measure that assesses symptoms of anxiety over the course of the last two weeks. [name] obtained a score of *** suggesting {gbgad7severity:21753}. [name] finds the endorsed symptoms to be {gbphq9difficulty:21754}. [0= Not at all; 1= Several days; 2= Over half the days; 3= Nearly every day] Feeling nervous, anxious, on edge ***  Not being able to stop or control worrying ***  Worrying too much about different things ***  Trouble relaxing ***  Being so restless that it's hard to sit still ***  Becoming easily annoyed or irritable ***  Feeling afraid as if something awful might happen ***  GAD-7 Score ***   Interventions:  {Interventions for Progress Notes:23405}  DSM-5 Diagnosis(es): 311 (F32.8) Other Specified Depressive Disorder, Emotional Eating Behaviors  Treatment Goal & Progress: During the initial appointment with this provider, the following treatment goal was established: increase coping skills. [name] has demonstrated progress in her goal as evidenced  by {gbtxprogress:22839}. [name] also {gbtxprogress2:22951}.  Plan: The next appointment will be scheduled in {gbweeks:21758}, which will be {  gbtxmodality:23402}. The next session will focus on {Plan for Next Appointment:23400}.

## 2020-05-23 ENCOUNTER — Ambulatory Visit (INDEPENDENT_AMBULATORY_CARE_PROVIDER_SITE_OTHER): Payer: Medicare Other | Admitting: Family Medicine

## 2020-05-23 ENCOUNTER — Encounter (INDEPENDENT_AMBULATORY_CARE_PROVIDER_SITE_OTHER): Payer: Self-pay | Admitting: Family Medicine

## 2020-05-23 ENCOUNTER — Other Ambulatory Visit: Payer: Self-pay

## 2020-05-23 VITALS — BP 115/76 | HR 66 | Temp 97.8°F | Ht 63.0 in | Wt 149.0 lb

## 2020-05-23 DIAGNOSIS — Z6830 Body mass index (BMI) 30.0-30.9, adult: Secondary | ICD-10-CM | POA: Diagnosis not present

## 2020-05-23 DIAGNOSIS — I10 Essential (primary) hypertension: Secondary | ICD-10-CM | POA: Diagnosis not present

## 2020-05-23 DIAGNOSIS — E669 Obesity, unspecified: Secondary | ICD-10-CM | POA: Diagnosis not present

## 2020-05-23 NOTE — Progress Notes (Signed)
Chief Complaint:   OBESITY [name] [name] is here to discuss her progress with her obesity treatment plan along with follow-up of her obesity related diagnoses. [name] [name] is on the Category 2 Plan and states she is following her eating plan approximately 80% of the time. [name] [name] states she is doing cardio and strengthening for 60 minutes 5 times per week.  Today's visit was #: 66 Starting weight: 258 lbs Starting date: 04/27/2018 Today's weight: 149 lbs Today's date: 05/23/2020 Total lbs lost to date: 109  Total lbs lost since last in-office visit: 4  Interim History: [name] [name] is down 4 lbs today. She has stuck to the plan better over the past few weeks. Her goal weight is 145 lbs and she is 149 lbs today. She has lost a total of 109 lbs on our program.  Subjective:   1. Essential hypertension [name] [name]'s blood pressure is well controlled on losartan. Cardiovascular ROS: no chest pain or dyspnea on exertion.  BP Readings from Last 3 Encounters:  05/23/20 115/76  05/09/20 118/68  04/18/20 (!) 136/75   Lab Results  Component Value Date   CREATININE 0.76 04/04/2020   CREATININE 0.80 10/12/2019   CREATININE 0.75 06/04/2019   Assessment/Plan:   1. Essential hypertension [name] [name] will continue losartan, and will continue working on healthy weight loss and exercise to improve blood pressure control. We will watch for signs of hypotension as she continues her lifestyle modifications.  2. Class 1 obesity with serious comorbidity and body mass index (BMI) of 30.0 to 30.9 in adult, unspecified obesity type [name] [name] is currently in the action stage of change. As such, her goal is to continue with weight loss efforts. She has agreed to the Category 2 Plan + 300 calories.   Exercise goals: As is.  Behavioral modification strategies: decreasing simple carbohydrates.  [name] [name] has agreed to follow-up with our clinic in 3 weeks.  Objective:   Blood pressure 115/76, pulse 66, temperature 97.8 F (36.6  C), height _0  (1.6 m), weight 149 lb (67.6 kg), SpO2 99 %. Body mass index is 26.39 kg/m.  General: Cooperative, alert, well developed, in no acute distress. HEENT: Conjunctivae and lids unremarkable. Cardiovascular: Regular rhythm.  Lungs: Normal work of breathing. Neurologic: No focal deficits.   Lab Results  Component Value Date   CREATININE 0.76 04/04/2020   BUN 14 04/04/2020   NA 135 04/04/2020   K 4.7 04/04/2020   CL 94 (L) 04/04/2020   CO2 26 04/04/2020   Lab Results  Component Value Date   ALT 21 04/04/2020   AST 27 04/04/2020   ALKPHOS 73 04/04/2020   BILITOT 0.3 04/04/2020   Lab Results  Component Value Date   HGBA1C 4.8 04/04/2020   HGBA1C 4.6 (L) 10/12/2019   HGBA1C 4.8 05/04/2019   HGBA1C 5.1 11/03/2018   HGBA1C 5.4 08/02/2018   Lab Results  Component Value Date   INSULIN 4.0 04/04/2020   INSULIN 3.5 10/12/2019   INSULIN 6.9 08/02/2018   INSULIN 7.0 04/27/2018   Lab Results  Component Value Date   TSH 2.100 05/04/2019   Lab Results  Component Value Date   CHOL 174 04/04/2020   HDL 85 04/04/2020   LDLCALC 77 04/04/2020   TRIG 63 04/04/2020   CHOLHDL 2.7 04/09/2016   Lab Results  Component Value Date   WBC 4.8 04/04/2020   HGB 13.9 04/04/2020   HCT 57.8 (H) 04/04/2020   MCV 88 04/04/2020   PLT 218 04/04/2020   Lab Results  Component Value Date   IRON 79 04/04/2020   TIBC 278 04/04/2020   FERRITIN 75 04/04/2020    Obesity Behavioral Intervention Documentation for Insurance:   Approximately 15 minutes were spent on the discussion below.  ASK: We discussed the diagnosis of obesity with [name] [name] today and [name] [name] agreed to give [place] permission to discuss obesity behavioral modification therapy today.  ASSESS: [name] [name] has the diagnosis of obesity and her BMI today is 26.4. [name] [name] is in the action stage of change.   ADVISE: [name] [name] was educated on the multiple health risks of obesity as well as the benefit of weight loss to improve her  health. She was advised of the need for long term treatment and the importance of lifestyle modifications to improve her current health and to decrease her risk of future health problems.  AGREE: Multiple dietary modification options and treatment options were discussed and [name] [name] agreed to follow the recommendations documented in the above note.  ARRANGE: [name] [name] was educated on the importance of frequent visits to treat obesity as outlined per CMS and USPSTF guidelines and agreed to schedule her next follow up appointment today.  Attestation Statements:   Reviewed by clinician on day of visit: allergies, medications, problem list, medical history, surgical history, family history, social history, and previous encounter notes.   [name], am acting as Location manager for [name], FNP-C.  I have reviewed the above documentation for accuracy and completeness, and I agree with the above. -  Georgianne Fick, FNP

## 2020-05-28 NOTE — Progress Notes (Signed)
  Office: [phone]  /  Fax: [phone]    Date: June 10, 2020   Appointment Start Time: 9:57am Duration: 24 minutes Provider: Glennie Isle, Psy.D. Type of Session: Individual Therapy  Location of Patient: Home Location of Provider: Provider's Home Type of Contact: Telepsychological Visit via MyChart Video Visit   Session Content: [name] [name] is a 69 y.o. female presenting for a follow-up appointment to address the previously established treatment goal of increasing coping skills. Today's appointment was a telepsychological visit due to COVID-19. [name] [name] provided verbal consent for today's telepsychological appointment and she is aware she is responsible for securing confidentiality on her end of the session. Prior to proceeding with today's appointment, [name] [name]'s physical location at the time of this appointment was obtained as well a phone number she could be reached at in the event of technical difficulties. [name] [name] and this provider participated in today's telepsychological service.   This provider conducted a brief check-in. [name] [name] reported she feels she has "done better," noting she continues to lose weight. She discussed "feeling better" physically and emotionally when she follows her meal plan; however, she acknowledged she deviates from her meal plan when on vacation. As such, this provider and [name] [name] discussed the dieting mentality, as well as all or nothing thinking. Additionally, psychoeducation regarding mindfulness was provided. A handout was provided to [name] [name] with further information regarding mindfulness, including exercises. This provider also explained the benefit of mindfulness as it relates to emotional eating. Mistina was encouraged to engage in the provided exercises between now and the next appointment with this provider. [name] [name] agreed. During today's appointment, [name] [name] was led through a mindfulness exercise involving her senses. [name] [name] provided verbal consent during today's  appointment for this provider to send a handout about mindfulness via e-mail. Furthermore, termination planning was discussed. [name] [name] was receptive to a follow-up appointment in 3-4 weeks and an additional follow-up/termination appointment in 3-4 weeks after that. [name] [name] was receptive to today's appointment as evidenced by openness to sharing, responsiveness to feedback, and willingness to engage in mindfulness exercises to assist with coping.  Mental Status Examination:  Appearance: well groomed and appropriate hygiene  Behavior: appropriate to circumstances Mood: euthymic Affect: mood congruent Speech: normal in rate, volume, and tone Eye Contact: appropriate Psychomotor Activity: appropriate Gait: unable to assess Thought Process: linear, logical, and goal directed  Thought Content/Perception: no hallucinations, delusions, bizarre thinking or behavior reported or observed and no evidence of suicidal and homicidal ideation, plan, and intent Orientation: time, person, place, and purpose of appointment Memory/Concentration: memory, attention, language, and fund of knowledge intact  Insight/Judgment: good  Interventions:  Conducted a brief chart review Provided empathic reflections and validation Employed supportive psychotherapy interventions to facilitate reduced distress and to improve coping skills with identified stressors Psychoeducation provided regarding all-or-nothing thinking Psychoeducation provided regarding mindfulness Engaged patient in mindfulness exercise(s) Employed acceptance and commitment interventions to emphasize mindfulness and acceptance without struggle Discussed termination planning  DSM-5 Diagnosis(es): 311 (F32.8) Other Specified Depressive Disorder, Emotional Eating Behaviors  Treatment Goal & Progress: During the initial appointment with this provider, the following treatment goal was established: increase coping skills. [name] [name] has demonstrated progress in  her goal as evidenced by increased awareness of hunger patterns and increased awareness of triggers for emotional eating. [name] [name] also continues to demonstrate willingness to engage in learned skill(s).  Plan: The next appointment will be scheduled in three weeks, which will be via MyChart Video Visit. The next session will focus on working towards the established treatment goal.

## 2020-05-29 ENCOUNTER — Telehealth (INDEPENDENT_AMBULATORY_CARE_PROVIDER_SITE_OTHER): Payer: Medicare Other | Admitting: Psychology

## 2020-06-06 ENCOUNTER — Ambulatory Visit (INDEPENDENT_AMBULATORY_CARE_PROVIDER_SITE_OTHER): Payer: Medicare Other | Admitting: Family Medicine

## 2020-06-10 ENCOUNTER — Telehealth (INDEPENDENT_AMBULATORY_CARE_PROVIDER_SITE_OTHER): Payer: Medicare Other | Admitting: Psychology

## 2020-06-10 DIAGNOSIS — F3289 Other specified depressive episodes: Secondary | ICD-10-CM | POA: Diagnosis not present

## 2020-06-11 DIAGNOSIS — Z23 Encounter for immunization: Secondary | ICD-10-CM | POA: Diagnosis not present

## 2020-06-13 ENCOUNTER — Encounter (INDEPENDENT_AMBULATORY_CARE_PROVIDER_SITE_OTHER): Payer: Self-pay | Admitting: Family Medicine

## 2020-06-13 ENCOUNTER — Other Ambulatory Visit: Payer: Self-pay

## 2020-06-13 ENCOUNTER — Ambulatory Visit (INDEPENDENT_AMBULATORY_CARE_PROVIDER_SITE_OTHER): Payer: Medicare Other | Admitting: Family Medicine

## 2020-06-13 VITALS — BP 143/74 | HR 63 | Temp 97.8°F | Ht 63.0 in | Wt 154.0 lb

## 2020-06-13 DIAGNOSIS — I10 Essential (primary) hypertension: Secondary | ICD-10-CM

## 2020-06-13 DIAGNOSIS — E119 Type 2 diabetes mellitus without complications: Secondary | ICD-10-CM | POA: Diagnosis not present

## 2020-06-13 DIAGNOSIS — E669 Obesity, unspecified: Secondary | ICD-10-CM

## 2020-06-13 DIAGNOSIS — Z6830 Body mass index (BMI) 30.0-30.9, adult: Secondary | ICD-10-CM | POA: Diagnosis not present

## 2020-06-14 NOTE — Progress Notes (Signed)
Chief Complaint:   OBESITY [name] [name] is here to discuss her progress with her obesity treatment plan along with follow-up of her obesity related diagnoses. [name] [name] is on the Category 2 Plan + 300 calories and states she is following her eating plan approximately 50% of the time. [name] [name] states she is doing cardio and strengthening for 30-60 minutes 4 times per week.  Today's visit was #: 21 Starting weight: 258 lbs Starting date: 04/27/2018 Today's weight: 154 lbs Today's date: 06/13/2020 Total lbs lost to date: 104 Total lbs lost since last in-office visit: 0  Interim History: [name] [name] recently returned from a trip to [place]. She notes that she indulged in some high calorie foods. She is up 5 lbs today. Her goal is 145 lbs.  Subjective:   1. Type 2 diabetes mellitus in remission ([place]) [name] [name]'s last A1c was 4.8. She is on metformin. She notes hunger and cravings.  Lab Results  Component Value Date   HGBA1C 4.8 04/04/2020   HGBA1C 4.6 (L) 10/12/2019   HGBA1C 4.8 05/04/2019   Lab Results  Component Value Date   LDLCALC 77 04/04/2020   CREATININE 0.76 04/04/2020   Lab Results  Component Value Date   INSULIN 4.0 04/04/2020   INSULIN 3.5 10/12/2019   INSULIN 6.9 08/02/2018   INSULIN 7.0 04/27/2018   2. Essential hypertension [name] [name]'s blood pressure is elevated today, but was within normal limits at her last 2 office visits. Cardiovascular ROS: no chest pain or dyspnea on exertion.  BP Readings from Last 3 Encounters:  06/13/20 (!) 143/74  05/23/20 115/76  05/09/20 118/68   Lab Results  Component Value Date   CREATININE 0.76 04/04/2020   CREATININE 0.80 10/12/2019   CREATININE 0.75 06/04/2019   Assessment/Plan:   1. Type 2 diabetes mellitus in remission [facility])  [name] [name] will continue her meal plan, and will reduce simple carbohydrates.  2. Essential hypertension  [name] [name] will continue hydrochlorothiazide and losartan.  3. Class 1 obesity with serious comorbidity  and body mass index (BMI) of 30.0 to 30.9 in adult, unspecified obesity type [name] [name] is currently in the action stage of change. As such, her goal is to continue with weight loss efforts. She has agreed to the Category 2 Plan.   Exercise goals: As is.  Behavioral modification strategies: decreasing simple carbohydrates and planning for success.  [name] [name] has agreed to follow-up with our clinic in 2 weeks.  Objective:   Blood pressure (!) 143/74, pulse 63, temperature 97.8 F (36.6 C), temperature source Oral, height 5' 3" (1.6 m), weight 154 lb (69.9 kg), SpO2 100 %. Body mass index is 27.28 kg/m.  General: Cooperative, alert, well developed, in no acute distress. HEENT: Conjunctivae and lids unremarkable. Cardiovascular: Regular rhythm.  Lungs: Normal work of breathing. Neurologic: No focal deficits.   Lab Results  Component Value Date   CREATININE 0.76 04/04/2020   BUN 14 04/04/2020   NA 135 04/04/2020   K 4.7 04/04/2020   CL 94 (L) 04/04/2020   CO2 26 04/04/2020   Lab Results  Component Value Date   ALT 21 04/04/2020   AST 27 04/04/2020   ALKPHOS 73 04/04/2020   BILITOT 0.3 04/04/2020   Lab Results  Component Value Date   HGBA1C 4.8 04/04/2020   HGBA1C 4.6 (L) 10/12/2019   HGBA1C 4.8 05/04/2019   HGBA1C 5.1 11/03/2018   HGBA1C 5.4 08/02/2018   Lab Results  Component Value Date   INSULIN 4.0 04/04/2020   INSULIN 3.5 10/12/2019  INSULIN 6.9 08/02/2018   INSULIN 7.0 04/27/2018   Lab Results  Component Value Date   TSH 2.100 05/04/2019   Lab Results  Component Value Date   CHOL 174 04/04/2020   HDL 85 04/04/2020   LDLCALC 77 04/04/2020   TRIG 63 04/04/2020   CHOLHDL 2.7 04/09/2016   Lab Results  Component Value Date   WBC 4.8 04/04/2020   HGB 13.9 04/04/2020   HCT 57.8 (H) 04/04/2020   MCV 88 04/04/2020   PLT 218 04/04/2020   Lab Results  Component Value Date   IRON 79 04/04/2020   TIBC 278 04/04/2020   FERRITIN 75 04/04/2020    Obesity  Behavioral Intervention:   Approximately 15 minutes were spent on the discussion below.  ASK: We discussed the diagnosis of obesity with [name] [name] today and [name] [name] agreed to give [place] permission to discuss obesity behavioral modification therapy today.  ASSESS: [name] [name] has the diagnosis of obesity and her BMI today is 27.29. [name] [name] is in the action stage of change.   ADVISE: [name] [name] was educated on the multiple health risks of obesity as well as the benefit of weight loss to improve her health. She was advised of the need for long term treatment and the importance of lifestyle modifications to improve her current health and to decrease her risk of future health problems.  AGREE: Multiple dietary modification options and treatment options were discussed and [name] [name] agreed to follow the recommendations documented in the above note.  ARRANGE: [name] [name] was educated on the importance of frequent visits to treat obesity as outlined per CMS and USPSTF guidelines and agreed to schedule her next follow up appointment today.  Attestation Statements:   Reviewed by clinician on day of visit: allergies, medications, problem list, medical history, surgical history, family history, social history, and previous encounter notes.   [name], am acting as Location manager for [name], FNP-C.  I have reviewed the above documentation for accuracy and completeness, and I agree with the above. -  Georgianne Fick, FNP

## 2020-06-17 ENCOUNTER — Encounter (INDEPENDENT_AMBULATORY_CARE_PROVIDER_SITE_OTHER): Payer: Self-pay | Admitting: Family Medicine

## 2020-06-17 NOTE — Progress Notes (Signed)
Entered in error

## 2020-06-20 IMAGING — MG MM DIGITAL SCREENING BILAT W/ TOMO W/ CAD
6 of 12 series · 6 of 36 positions shown · non-contrast
Comparison: Previous exam(s).

CLINICAL DATA: Screening.

EXAM:
DIGITAL SCREENING BILATERAL MAMMOGRAM WITH TOMO AND CAD

[R MLO synth-2D]
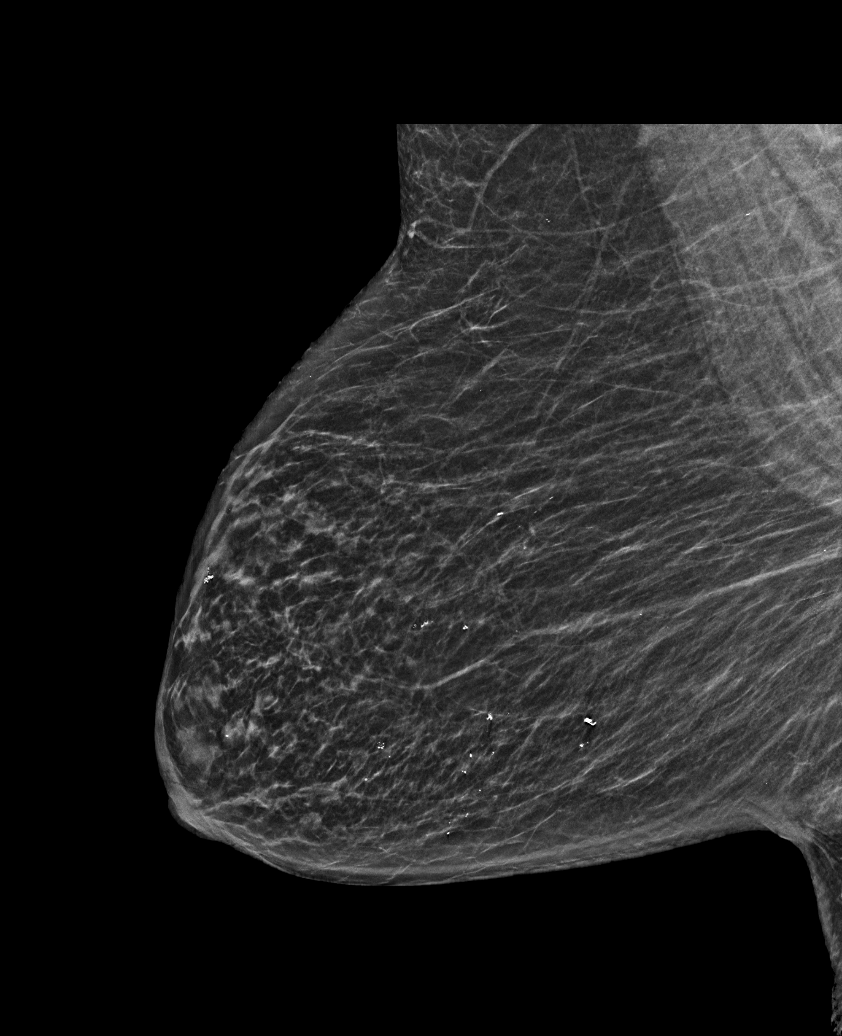

[R CC synth-2D (1 of 2)]
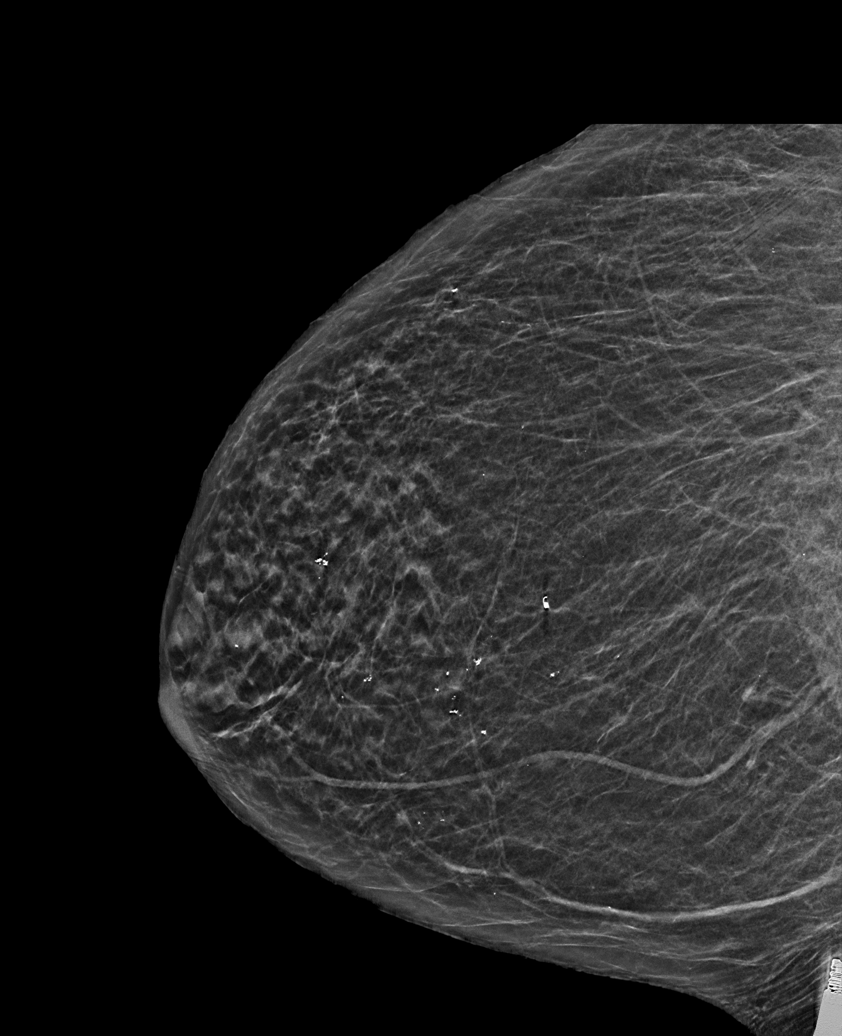

[L XCCL synth-2D]
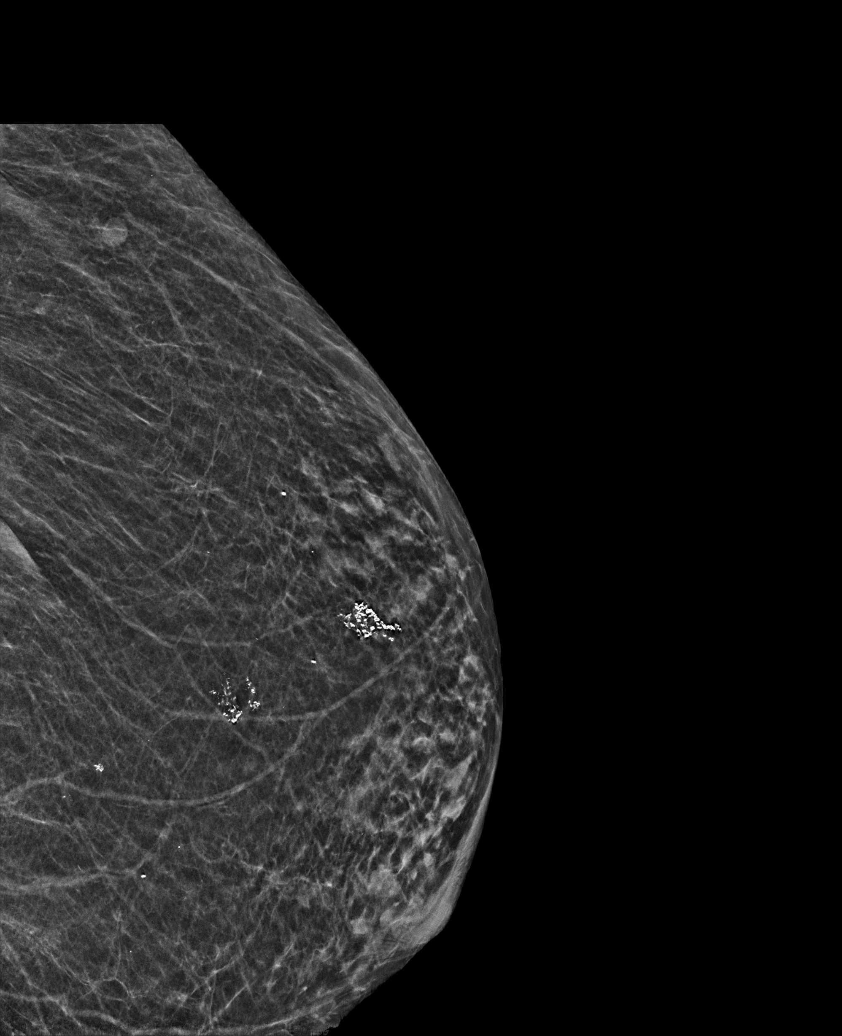

[R CC synth-2D (2 of 2)]
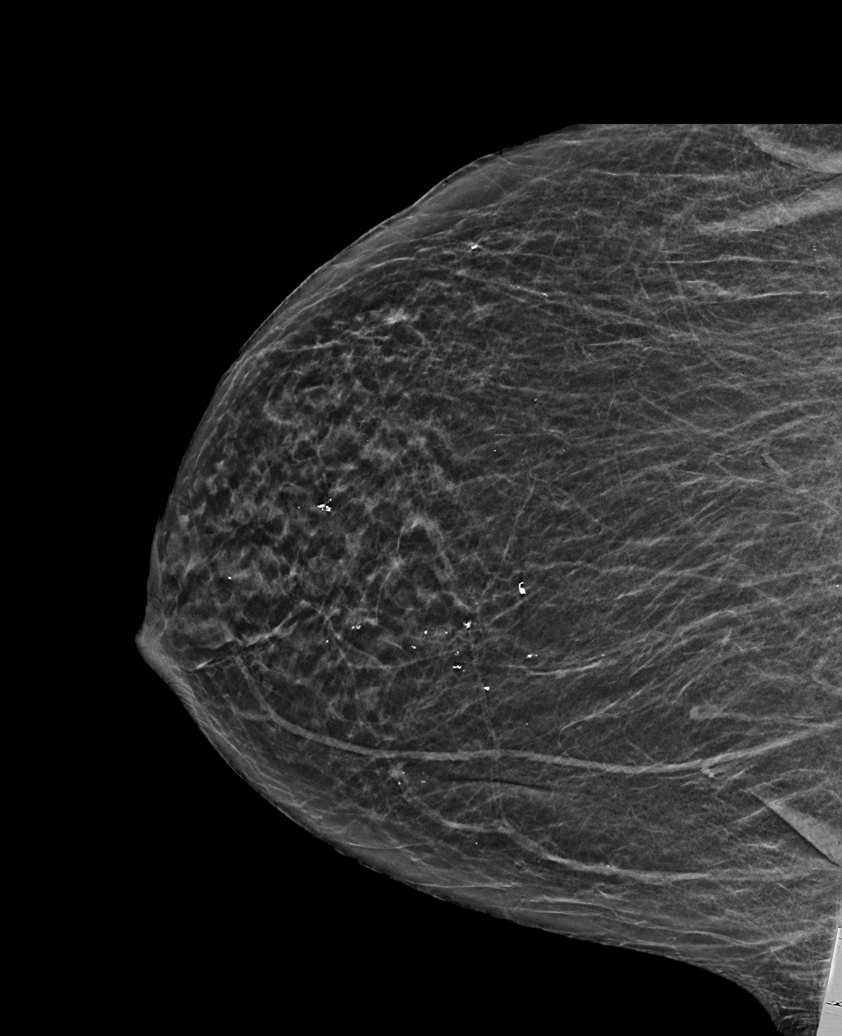

[L MLO synth-2D]
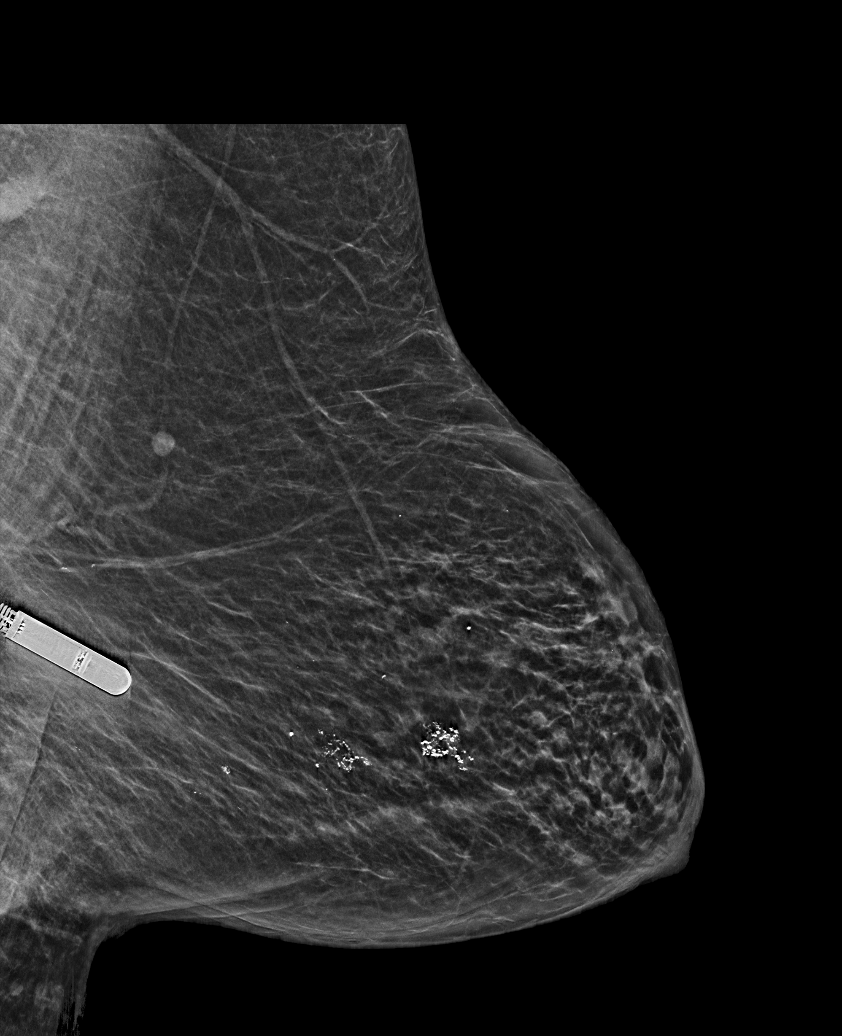

[L CC synth-2D]
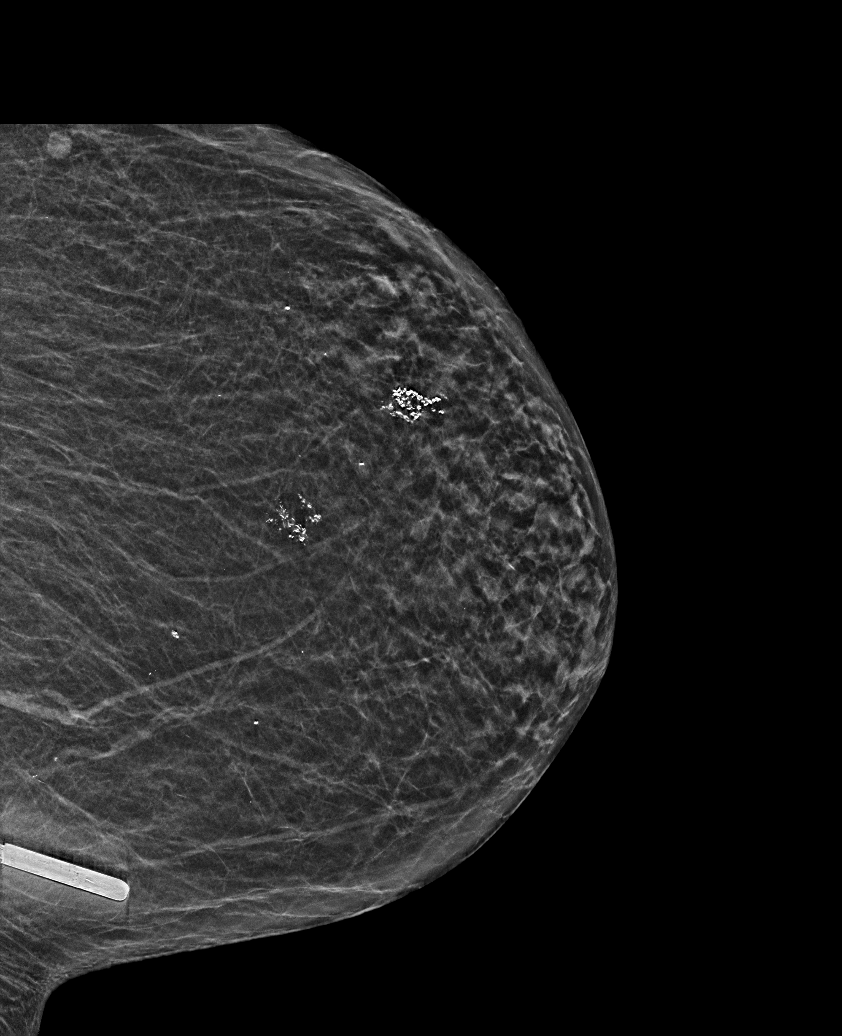

[6 of 36 positions shown; findings below may reference images not displayed]

ACR Breast Density Category b: There are scattered areas of
fibroglandular density.
FINDINGS: There are no findings suspicious for malignancy. Images were
processed with CAD.
IMPRESSION: No mammographic evidence of malignancy. A result letter of this
screening mammogram will be mailed directly to the patient.

RECOMMENDATION:
Screening mammogram in one year. (Code:[id number])

BI-RADS CATEGORY  1: Negative.

## 2020-06-21 DIAGNOSIS — Z23 Encounter for immunization: Secondary | ICD-10-CM | POA: Diagnosis not present

## 2020-06-26 ENCOUNTER — Other Ambulatory Visit: Payer: Self-pay

## 2020-06-26 ENCOUNTER — Ambulatory Visit
Admission: RE | Admit: 2020-06-26 | Discharge: 2020-06-26 | Disposition: A | Discharge status: Home / Self Care | Payer: Medicare Other | Source: Ambulatory Visit | Attending: Plastic Surgery | Admitting: Plastic Surgery

## 2020-06-26 DIAGNOSIS — Z1231 Encounter for screening mammogram for malignant neoplasm of breast: Secondary | ICD-10-CM

## 2020-06-27 ENCOUNTER — Ambulatory Visit (INDEPENDENT_AMBULATORY_CARE_PROVIDER_SITE_OTHER): Payer: Medicare Other | Admitting: Family Medicine

## 2020-06-27 ENCOUNTER — Other Ambulatory Visit: Payer: Self-pay

## 2020-06-27 ENCOUNTER — Encounter (INDEPENDENT_AMBULATORY_CARE_PROVIDER_SITE_OTHER): Payer: Self-pay | Admitting: Family Medicine

## 2020-06-27 VITALS — BP 118/71 | HR 58 | Temp 97.7°F | Ht 63.0 in | Wt 152.0 lb

## 2020-06-27 DIAGNOSIS — Z6830 Body mass index (BMI) 30.0-30.9, adult: Secondary | ICD-10-CM | POA: Diagnosis not present

## 2020-06-27 DIAGNOSIS — K9089 Other intestinal malabsorption: Secondary | ICD-10-CM

## 2020-06-27 DIAGNOSIS — E538 Deficiency of other specified B group vitamins: Secondary | ICD-10-CM | POA: Diagnosis not present

## 2020-06-27 DIAGNOSIS — D509 Iron deficiency anemia, unspecified: Secondary | ICD-10-CM

## 2020-06-27 DIAGNOSIS — E559 Vitamin D deficiency, unspecified: Secondary | ICD-10-CM

## 2020-06-27 DIAGNOSIS — E119 Type 2 diabetes mellitus without complications: Secondary | ICD-10-CM

## 2020-06-27 DIAGNOSIS — E669 Obesity, unspecified: Secondary | ICD-10-CM | POA: Diagnosis not present

## 2020-06-27 DIAGNOSIS — K909 Intestinal malabsorption, unspecified: Secondary | ICD-10-CM | POA: Diagnosis not present

## 2020-06-27 MED ORDER — METFORMIN HCL 500 MG PO TABS: 500.0000 mg | ORAL_TABLET | Freq: Two times a day (BID) | ORAL | 0 refills | Status: DC

## 2020-06-27 NOTE — Progress Notes (Signed)
Chief Complaint:   OBESITY [name] [name] [name] here to discuss her progress with her obesity treatment plan along with follow-up of her obesity related diagnoses. [name] [name] [name] on the Category 2 Plan and states she [name] following her eating plan approximately 70-75% of the time. [name] [name] states she [name] doing cardio and strength for 60 minutes 5 times per week.  Today's visit was #: 54 Starting weight: 258 lbs Starting date: 04/27/2018 Today's weight: 152 lbs Today's date: 06/27/2020 Total lbs lost to date: 106 Total lbs lost since last in-office visit: 2  Interim History: Ndia has done well adhering to the plan, but she admits to some "cheating" at times. She always does well with getting back on plan after indulging.   Subjective:   1. Type 2 diabetes mellitus in remission ([name] [name]) [name] [name]'s last A1c was 4.9 and her DM [name] in remission.  We are restarting metformin to help with her appetite.  Lab Results  Component Value Date   HGBA1C 4.8 04/04/2020   HGBA1C 4.6 (L) 10/12/2019   HGBA1C 4.8 05/04/2019   Lab Results  Component Value Date   LDLCALC 77 04/04/2020   CREATININE 0.76 04/04/2020   Lab Results  Component Value Date   INSULIN 4.0 04/04/2020   INSULIN 3.5 10/12/2019   INSULIN 6.9 08/02/2018   INSULIN 7.0 04/27/2018   2. 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 She [name] not on supplementation.  Lab Results  Component Value Date   VITAMINB12 493 04/04/2020   3. Folate deficiency [name] [name] [name] on 600 mcg of folic acid daily. Last folate level was low.   4. 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 High weight was 295 lbs. She lost to 213 lbs but then began regaining. She was 258 lbs when she started at our clinic in August of 2019.   5. Iron deficiency anemia [name] [name] [name] on 325 mg of iron daily. She denies fatigue. She has a history of gastric bypass.  CBC Latest Ref Rng & Units 04/04/2020  04/04/2020 06/04/2019  WBC 3.4 - 10.8 x10E3/uL - 4.8 8.0  Hemoglobin 11.1 - 15.9 g/dL - 13.9 13.6  Hematocrit 34.0 - 46.6 % 57.8(H) 42.1 42.2  Platelets 150 - 450 x10E3/uL - 218 244   Lab Results  Component Value Date   IRON 79 04/04/2020   TIBC 278 04/04/2020   FERRITIN 75 04/04/2020   Lab Results  Component Value Date   [id number] 943 04/04/2020   6. Vitamin D deficiency [name] [name] [name] on Vit D 5,000 IU BID. Last Vit D level was low 40.3. She denies fatigue.  Assessment/Plan:   1. Type 2 diabetes mellitus in remission ([name] [name]) Good blood sugar control [name] important to decrease the likelihood of diabetic complications such as nephropathy, neuropathy, limb loss, blindness, coronary artery disease, and death. Intensive lifestyle modification including diet, exercise and weight loss are the first line of treatment for diabetes. [name] [name] [name] to start metformin 500 mg BID with meals, with a 90 day supply, with no refills.  - metFORMIN (GLUCOPHAGE) 500 MG tablet; Take 1 tablet (500 mg total) by mouth 2 (two) times daily with a meal.  Dispense: 180 tablet; Refill: 0  2. B12 deficiency - Vitamin B12  3. Folate deficiency We will check labs today.  - Folate  4. Intestinal malabsorption, unspecified We will check labs today.  - Comprehensive metabolic panel - Vitamin B1 - Vitamin B6  5.  Iron deficiency anemia We will check labs today. - Iron and TIBC - Ferritin - CBC with Differential/Platelet  6. Vitamin D deficiency Low Vitamin D level contributes to fatigue and are associated with obesity, breast, and colon cancer. We will check labs today. [name] [name] [name] to continue taking OTC Vitamin D 5,000 IU BID. She will follow-up for routine testing of Vitamin D, at least 2-3 times per year to avoid over-replacement.  - VITAMIN D 25 Hydroxy (Vit-D Deficiency, Fractures)  7. Class 1 obesity with serious comorbidity and body mass index (BMI) of 30.0 to 30.9 in adult, unspecified obesity  type [name] [name] [name] currently in the action stage of change. As such, her goal [name] to continue with weight loss efforts. She has [name] to the Category 2 Plan.   Exercise goals: As [name].  Behavioral modification strategies: increasing lean protein intake and decreasing simple carbohydrates.  [name] [name] has [name] to follow-up with our clinic in 2 weeks.  [name] [name] was informed we would discuss her lab results at her next visit unless there [name] a critical issue that needs to be addressed sooner. [name] [name] to keep her next visit at the [name] upon time to discuss these results.  Objective:   Blood pressure 118/71, pulse (!) 58, temperature 97.7 F (36.5 C), height _0  (1.6 m), weight 152 lb (68.9 kg), SpO2 100 %. Body mass index [name] 26.93 kg/m.  General: Cooperative, alert, well developed, in no acute distress. HEENT: Conjunctivae and lids unremarkable. Cardiovascular: Regular rhythm.  Lungs: Normal work of breathing. Neurologic: No focal deficits.   Lab Results  Component Value Date   CREATININE 0.76 04/04/2020   BUN 14 04/04/2020   NA 135 04/04/2020   K 4.7 04/04/2020   CL 94 (L) 04/04/2020   CO2 26 04/04/2020   Lab Results  Component Value Date   ALT 21 04/04/2020   AST 27 04/04/2020   ALKPHOS 73 04/04/2020   BILITOT 0.3 04/04/2020   Lab Results  Component Value Date   HGBA1C 4.8 04/04/2020   HGBA1C 4.6 (L) 10/12/2019   HGBA1C 4.8 05/04/2019   HGBA1C 5.1 11/03/2018   HGBA1C 5.4 08/02/2018   Lab Results  Component Value Date   INSULIN 4.0 04/04/2020   INSULIN 3.5 10/12/2019   INSULIN 6.9 08/02/2018   INSULIN 7.0 04/27/2018   Lab Results  Component Value Date   TSH 2.100 05/04/2019   Lab Results  Component Value Date   CHOL 174 04/04/2020   HDL 85 04/04/2020   LDLCALC 77 04/04/2020   TRIG 63 04/04/2020   CHOLHDL 2.7 04/09/2016   Lab Results  Component Value Date   WBC 4.8 04/04/2020   HGB 13.9 04/04/2020   HCT 57.8 (H) 04/04/2020   MCV 88 04/04/2020   PLT  218 04/04/2020   Lab Results  Component Value Date   IRON 79 04/04/2020   TIBC 278 04/04/2020   FERRITIN 75 04/04/2020    Obesity Behavioral Intervention:   Approximately 15 minutes were spent on the discussion below.  ASK: We discussed the diagnosis of obesity with [name] [name] today and [name] [name] [name] to give [place] permission to discuss obesity behavioral modification therapy today.  ASSESS: [name] [name] has the diagnosis of obesity and her BMI today [name] 26.93. [name] [name] in the action stage of change.   ADVISE: [name] [name] was educated on the multiple health risks of obesity as well as the benefit of weight loss to improve her health. She was advised of the need for long term treatment and  the importance of lifestyle modifications to improve her current health and to decrease her risk of future health problems.  AGREE: Multiple dietary modification options and treatment options were discussed and [name] [name] [name] to follow the recommendations documented in the above note.  ARRANGE: [name] [name] was educated on the importance of frequent visits to treat obesity as outlined per CMS and USPSTF guidelines and [name] to schedule her next follow up appointment today.  Attestation Statements:   Reviewed by clinician on day of visit: allergies, medications, problem list, medical history, surgical history, family history, social history, and previous encounter notes.   [name], am acting as Location manager for [name], FNP-C.  I have reviewed the above documentation for accuracy and completeness, and I agree with the above. -  Georgianne Fick, FNP

## 2020-07-01 ENCOUNTER — Telehealth (INDEPENDENT_AMBULATORY_CARE_PROVIDER_SITE_OTHER): Payer: Self-pay | Admitting: Psychology

## 2020-07-01 ENCOUNTER — Encounter (INDEPENDENT_AMBULATORY_CARE_PROVIDER_SITE_OTHER): Payer: Medicare Other | Admitting: Psychology

## 2020-07-01 ENCOUNTER — Encounter (INDEPENDENT_AMBULATORY_CARE_PROVIDER_SITE_OTHER): Payer: Self-pay

## 2020-07-01 NOTE — Telephone Encounter (Signed)
  Office: [phone]  /  Fax: [phone]  Date of Encounter: July 01, 2020  Time of Encounter: 9:30am Duration of Encounter: ~4 minutes Provider: Glennie Isle, PsyD  CONTENT: [name] [name] presented on time for today's follow-up appointment via [place] Visit. She explained she was waiting for an important call that she would have to take whenever it came in. As such, the option of rescheduling was discussed. [name] [name] reported she is "doing well;" therefore, was receptive to rescheduling today's appointment to avoid interruptions. No evidence or endorsement of safety concerns.   PLAN: [name] [name] is scheduled for an appointment on July 09, 2020 at 9:30 via [place] Visit.

## 2020-07-01 NOTE — Progress Notes (Signed)
Office: [phone]  /  Fax: [phone]    Date: July 09, 2020   Appointment Start Time: 9:34am Duration: 28 minutes Provider: Glennie Isle, Psy.D. Type of Session: Individual Therapy  Location of Patient: [place] home in [place], [place] Location of Provider: Provider's Home Type of Contact: Telepsychological Visit via MyChart Video Visit  Session Content: [name] [name] is a 69 y.o. female presenting for a follow-up appointment to address the previously established treatment goal of increasing coping skills. Today's appointment was a telepsychological visit due to COVID-19. [name] [name] provided verbal consent for today's telepsychological appointment and she is aware she is responsible for securing confidentiality on her end of the session. Prior to proceeding with today's appointment, [name] [name]'s physical location at the time of this appointment was obtained as well a phone number she could be reached at in the event of technical difficulties. [name] [name] and this provider participated in today's telepsychological service.   This provider conducted a brief check-in. [name] [name] stated an improvement with emotional well-being. She also described feeling "pretty happy" with where she is with her weight loss. She discussed learned skills have helped with the aforementioned. Positive reinforcement was provided. Session focused further on mindfulness to assist with coping. She discussed engaging in shared exercises, noting mindfulness has helped reduce emotional eating. [name] [name] was led through a mindfulness exercise (A Taste of Mindfulness) and her experience was processed. [name] [name] provided verbal consent during today's appointment for this provider to send the handout for today's exercise via e-mail. This provider also discussed the utilization of YouTube for mindfulness exercises (e.g., exercises by Merri Ray). Notably, this provider discussed her upcoming maternity leave toward the end of November. Providencia  acknowledged understanding given the uncertain nature of the circumstances, this provider may be out of the office sooner. This provider and [name] [name] discussed referral options and verbal consent was provided for this provider to send  referral options via e-mail. All questions/concerns were addressed. [name] [name] denied any concerns. [name] [name] was receptive to today's appointment as evidenced by openness to sharing, responsiveness to feedback, and willingness to continue engaging in mindfulness exercises.  Mental Status Examination:  Appearance: well groomed and appropriate hygiene  Behavior: appropriate to circumstances Mood: euthymic Affect: mood congruent Speech: normal in rate, volume, and tone Eye Contact: appropriate Psychomotor Activity: appropriate Gait: unable to assess Thought Process: linear, logical, and goal directed  Thought Content/Perception: no hallucinations, delusions, bizarre thinking or behavior reported or observed and no evidence of suicidal and homicidal ideation, plan, and intent Orientation: time, person, place, and purpose of appointment Memory/Concentration: memory, attention, language, and fund of knowledge intact  Insight/Judgment: good  Interventions:  Conducted a brief chart review Provided empathic reflections and validation Reviewed content from the previous session Provided positive reinforcement Employed supportive psychotherapy interventions to facilitate reduced distress and to improve coping skills with identified stressors Engaged patient in mindfulness exercise(s) Employed acceptance and commitment interventions to emphasize mindfulness and acceptance without struggle  DSM-5 Diagnosis(es): 311 (F32.8) Other Specified Depressive Disorder, Emotional Eating Behaviors  Treatment Goal & Progress: During the initial appointment with this provider, the following treatment goal was established: increase coping skills. [name] [name] has demonstrated progress in her goal as  evidenced by increased awareness of hunger patterns, increased awareness of triggers for emotional eating and reduction in emotional eating. [name] [name] also continues to demonstrate willingness to engage in learned skill(s).  Plan: The next appointment will be scheduled in 3-4 weeks, which will be via MyChart Video Visit. The next session will focus on working towards  the established treatment goal.

## 2020-07-05 LAB — CBC WITH DIFFERENTIAL/PLATELET
Basophils Absolute: 0.1 10*3/uL (ref 0.0–0.2)
Basos: 1 %
EOS (ABSOLUTE): 0.1 10*3/uL (ref 0.0–0.4)
Eos: 3 %
Hematocrit: 41.1 % (ref 34.0–46.6)
Hemoglobin: 14.3 g/dL (ref 11.1–15.9)
Immature Grans (Abs): 0 10*3/uL (ref 0.0–0.1)
Immature Granulocytes: 0 %
Lymphocytes Absolute: 1 10*3/uL (ref 0.7–3.1)
Lymphs: 23 %
MCH: 31.7 pg (ref 26.6–33.0)
MCHC: 34.8 g/dL (ref 31.5–35.7)
MCV: 91 fL (ref 79–97)
Monocytes Absolute: 0.4 10*3/uL (ref 0.1–0.9)
Monocytes: 9 %
Neutrophils Absolute: 2.8 10*3/uL (ref 1.4–7.0)
Neutrophils: 64 %
Platelets: 235 10*3/uL (ref 150–450)
RBC: 4.51 x10E6/uL (ref 3.77–5.28)
RDW: 12.6 % (ref 11.7–15.4)
WBC: 4.4 10*3/uL (ref 3.4–10.8)

## 2020-07-05 LAB — COMPREHENSIVE METABOLIC PANEL
ALT: 23 IU/L (ref 0–32)
AST: 22 IU/L (ref 0–40)
Albumin/Globulin Ratio: 2 (ref 1.2–2.2)
Albumin: 4.4 g/dL (ref 3.8–4.8)
Alkaline Phosphatase: 74 IU/L (ref 44–121)
BUN/Creatinine Ratio: 21 (ref 12–28)
BUN: 15 mg/dL (ref 8–27)
Bilirubin Total: 0.4 mg/dL (ref 0.0–1.2)
CO2: 24 mmol/L (ref 20–29)
Calcium: 9.5 mg/dL (ref 8.7–10.3)
Chloride: 92 mmol/L — ABNORMAL LOW (ref 96–106)
Creatinine, Ser: 0.73 mg/dL (ref 0.57–1.00)
GFR calc Af Amer: 97 mL/min/{1.73_m2} (ref >59)
GFR calc non Af Amer: 84 mL/min/{1.73_m2} (ref >59)
Globulin, Total: 2.2 g/dL (ref 1.5–4.5)
Glucose: 90 mg/dL (ref 65–99)
Potassium: 4.3 mmol/L (ref 3.5–5.2)
Sodium: 132 mmol/L — ABNORMAL LOW (ref 134–144)
Total Protein: 6.6 g/dL (ref 6.0–8.5)

## 2020-07-05 LAB — VITAMIN D 25 HYDROXY (VIT D DEFICIENCY, FRACTURES): Vit D, 25-Hydroxy: 44.6 ng/mL (ref 30.0–100.0)

## 2020-07-05 LAB — VITAMIN B12: Vitamin B-12: 668 pg/mL (ref 232–1245)

## 2020-07-05 LAB — IRON AND TIBC
Iron Saturation: 18 % (ref 15–55)
Iron: 51 ug/dL (ref 27–139)
Total Iron Binding Capacity: 284 ug/dL (ref 250–450)
UIBC: 233 ug/dL (ref 118–369)

## 2020-07-05 LAB — FOLATE: Folate: >20 ng/mL (ref >3.0)

## 2020-07-05 LAB — VITAMIN B6: Vitamin B6: 4.4 ug/L (ref 2.0–32.8)

## 2020-07-05 LAB — FERRITIN: Ferritin: 100 ng/mL (ref 15–150)

## 2020-07-05 LAB — VITAMIN B1: Thiamine: 137.1 nmol/L (ref 66.5–200.0)

## 2020-07-09 ENCOUNTER — Telehealth (INDEPENDENT_AMBULATORY_CARE_PROVIDER_SITE_OTHER): Payer: Medicare Other | Admitting: Psychology

## 2020-07-09 DIAGNOSIS — F3289 Other specified depressive episodes: Secondary | ICD-10-CM

## 2020-07-11 ENCOUNTER — Ambulatory Visit (INDEPENDENT_AMBULATORY_CARE_PROVIDER_SITE_OTHER): Payer: Medicare Other | Admitting: Family Medicine

## 2020-07-12 DIAGNOSIS — Z01818 Encounter for other preprocedural examination: Secondary | ICD-10-CM | POA: Diagnosis not present

## 2020-07-16 NOTE — Progress Notes (Signed)
  Office: [phone]  /  Fax: [phone]    Date: July 30, 2020   Appointment Start Time: 9:27am Duration: 21 minutes Provider: Glennie Isle, Psy.D. Type of Session: Individual Therapy  Location of Patient: Home Location of Provider: Provider's Home Type of Contact: Telepsychological Visit via MyChart Video Visit  Session Content: [name] [name] is a 69 y.o. female presenting for a follow-up appointment to address the previously established treatment goal of increasing coping skills. Today's appointment was a telepsychological visit due to COVID-19. [name] provided verbal consent for today's telepsychological appointment and she is aware she is responsible for securing confidentiality on her end of the session. Prior to proceeding with today's appointment, [name]'s physical location at the time of this appointment was obtained as well a phone number she could be reached at in the event of technical difficulties. Johnette and this provider participated in today's telepsychological service.   This provider conducted a brief check-in. [name] shared she had surgery resulting in frequent snacking secondary to the boredom. She expressed desire to engage in physical activity, but she has not been cleared yet. Session focused further on mindfulness to assist with coping, especially with urges to snack during her recovery. [name] acknowledged she is not engaging in mindfulness exercises regularly, but shared exercises she has engaged in since the last appointment with this provider and discussed a goal to practice more regularly. Clydean was led through a mindfulness exercise (urge surfing) and her experience was processed. [name] provided verbal consent during today's appointment for this provider to send a handout for today's exercise via e-mail. Overall, [name] was receptive to today's appointment as evidenced by openness to sharing, responsiveness to feedback, and willingness to continue engaging in learned  skills.  Mental Status Examination:  Appearance: well groomed and appropriate hygiene  Behavior: appropriate to circumstances Mood: euthymic Affect: mood congruent Speech: normal in rate, volume, and tone Eye Contact: appropriate Psychomotor Activity: appropriate Gait: unable to assess Thought Process: linear, logical, and goal directed  Thought Content/Perception: no hallucinations, delusions, bizarre thinking or behavior reported or observed and no evidence of suicidal and homicidal ideation, plan, and intent Orientation: time, person, place, and purpose of appointment Memory/Concentration: memory, attention, language, and fund of knowledge intact  Insight/Judgment: good  Interventions:  Conducted a brief chart review Provided empathic reflections and validation Employed supportive psychotherapy interventions to facilitate reduced distress and to improve coping skills with identified stressors Engaged patient in mindfulness exercise(s) Employed acceptance and commitment interventions to emphasize mindfulness and acceptance without struggle Reviewed learned skills  DSM-5 Diagnosis(es): 311 (F32.8) Other Specified Depressive Disorder, Emotional Eating Behaviors  Treatment Goal & Progress: During the initial appointment with this provider, the following treatment goal was established: increase coping skills. [name] demonstrated progress in her goal as evidenced by increased awareness of hunger patterns and increased awareness of triggers for emotional eating. [name] also continues to demonstrate willingness to engage in learned skill(s).  Plan: Today was [name]'s last appointment with this provider. She acknowledged understanding that she may request a follow-up appointment with this provider in the future (following this provider's maternity leave as previously discussed) as long as she is still established with the clinic and expressed willingness to reach out to shared referrals if  needed. No further follow-up planned by this provider.

## 2020-07-30 ENCOUNTER — Telehealth (INDEPENDENT_AMBULATORY_CARE_PROVIDER_SITE_OTHER): Payer: Medicare Other | Admitting: Psychology

## 2020-07-30 DIAGNOSIS — F3289 Other specified depressive episodes: Secondary | ICD-10-CM | POA: Diagnosis not present

## 2020-08-06 DIAGNOSIS — Z9884 Bariatric surgery status: Secondary | ICD-10-CM | POA: Diagnosis not present

## 2020-08-06 DIAGNOSIS — Z1152 Encounter for screening for COVID-19: Secondary | ICD-10-CM | POA: Diagnosis not present

## 2020-08-06 DIAGNOSIS — Z7901 Long term (current) use of anticoagulants: Secondary | ICD-10-CM | POA: Diagnosis not present

## 2020-08-06 DIAGNOSIS — Z01810 Encounter for preprocedural cardiovascular examination: Secondary | ICD-10-CM | POA: Diagnosis not present

## 2020-08-06 DIAGNOSIS — Z8639 Personal history of other endocrine, nutritional and metabolic disease: Secondary | ICD-10-CM | POA: Diagnosis not present

## 2020-08-06 DIAGNOSIS — E538 Deficiency of other specified B group vitamins: Secondary | ICD-10-CM | POA: Diagnosis not present

## 2020-08-06 DIAGNOSIS — I253 Aneurysm of heart: Secondary | ICD-10-CM | POA: Diagnosis not present

## 2020-08-06 DIAGNOSIS — Z01818 Encounter for other preprocedural examination: Secondary | ICD-10-CM | POA: Diagnosis not present

## 2020-08-06 DIAGNOSIS — G629 Polyneuropathy, unspecified: Secondary | ICD-10-CM | POA: Diagnosis not present

## 2020-08-06 DIAGNOSIS — E7800 Pure hypercholesterolemia, unspecified: Secondary | ICD-10-CM | POA: Diagnosis not present

## 2020-08-06 DIAGNOSIS — I10 Essential (primary) hypertension: Secondary | ICD-10-CM | POA: Diagnosis not present

## 2020-08-06 DIAGNOSIS — Z8673 Personal history of transient ischemic attack (TIA), and cerebral infarction without residual deficits: Secondary | ICD-10-CM | POA: Diagnosis not present

## 2020-08-08 ENCOUNTER — Other Ambulatory Visit: Payer: Self-pay

## 2020-08-08 ENCOUNTER — Encounter (INDEPENDENT_AMBULATORY_CARE_PROVIDER_SITE_OTHER): Payer: Self-pay | Admitting: Family Medicine

## 2020-08-08 ENCOUNTER — Ambulatory Visit (INDEPENDENT_AMBULATORY_CARE_PROVIDER_SITE_OTHER): Payer: Medicare Other | Admitting: Family Medicine

## 2020-08-08 VITALS — BP 136/74 | HR 62 | Temp 98.7°F | Ht 63.0 in | Wt 153.0 lb

## 2020-08-08 DIAGNOSIS — E1159 Type 2 diabetes mellitus with other circulatory complications: Secondary | ICD-10-CM

## 2020-08-08 DIAGNOSIS — E119 Type 2 diabetes mellitus without complications: Secondary | ICD-10-CM | POA: Diagnosis not present

## 2020-08-08 DIAGNOSIS — E669 Obesity, unspecified: Secondary | ICD-10-CM

## 2020-08-08 DIAGNOSIS — Z6830 Body mass index (BMI) 30.0-30.9, adult: Secondary | ICD-10-CM | POA: Diagnosis not present

## 2020-08-08 DIAGNOSIS — I152 Hypertension secondary to endocrine disorders: Secondary | ICD-10-CM

## 2020-08-08 MED ORDER — METFORMIN HCL 500 MG PO TABS: 500.0000 mg | ORAL_TABLET | Freq: Three times a day (TID) | ORAL | 0 refills | Status: DC

## 2020-08-08 MED ORDER — HYDROCHLOROTHIAZIDE 25 MG PO TABS: 25.0000 mg | ORAL_TABLET | Freq: Every day | ORAL | 0 refills | Status: DC

## 2020-08-12 ENCOUNTER — Encounter (INDEPENDENT_AMBULATORY_CARE_PROVIDER_SITE_OTHER): Payer: Self-pay | Admitting: Family Medicine

## 2020-08-12 ENCOUNTER — Telehealth: Payer: Self-pay | Admitting: Neurology

## 2020-08-12 ENCOUNTER — Encounter: Payer: Self-pay | Admitting: *Deleted

## 2020-08-12 NOTE — Telephone Encounter (Signed)
Yes please write it but include the following: Neurology cannot provide "clearance" as the benefits have to be balanced with the risks and this should be discussed between the patient and surgeon. There is a risk of stroke or other sequelae while off of the Xarelto but as long as patient understands the risks and has had discussion with surgeon then she may go off of the Xarelto for 5 days with the understanding that there is always risks when doing this. thanks

## 2020-08-12 NOTE — Telephone Encounter (Signed)
Letter signed by Dr Jaynee Eagles. I called [facility] and LVM asking for call back to confirm fax number. When they call back, please make sure they confirm [name] [name] as a mutual patient and confirm the fax number for Belinda Aboody (?). The number the pt gave [place] is [phone].

## 2020-08-12 NOTE — Telephone Encounter (Signed)
Letter written and is pending AA, MD signature.

## 2020-08-12 NOTE — Telephone Encounter (Signed)
Pt called, need to fax surgical clearance for surgery on December 14 for thigh and butt lift. Fax no: [phone], attn: Belinda Aboody.

## 2020-08-12 NOTE — Progress Notes (Signed)
Chief Complaint:   OBESITY [name] [name] is here to discuss her progress with her obesity treatment plan along with follow-up of her obesity related diagnoses. [name] [name] is on the Category 2 Plan and states she is following her eating plan approximately 30% of the time. [name] [name] states she is doing 0 minutes 0 times per week.  Today's visit was #: 51 Starting weight: 258 lbs Starting date: 04/27/2018 Today's weight: 153 lbs Today's date: 08/08/2020 Total lbs lost to date: 105 Total lbs lost since last in-office visit: 0  Interim History: [name] [name] is status post mastopexy on 07/06/2020, and she is healing well. She will have leg lift on 09/03/2020. She still notes swelling in her legs after mastopexy. She notes eh ahs been off the plan quite a bit but she is only up 1 lb today.   Subjective:   1. Type 2 diabetes mellitus in remission ([place]) [name] [name]'s last A1c was 4.8. She struggles with hunger, so we are using metformin for this.  Lab Results  Component Value Date   HGBA1C 4.8 04/04/2020   HGBA1C 4.6 (L) 10/12/2019   HGBA1C 4.8 05/04/2019   Lab Results  Component Value Date   LDLCALC 77 04/04/2020   CREATININE 0.73 06/27/2020   Lab Results  Component Value Date   INSULIN 4.0 04/04/2020   INSULIN 3.5 10/12/2019   INSULIN 6.9 08/02/2018   INSULIN 7.0 04/27/2018   2. Hypertension associated with type 2 diabetes mellitus ([place]) [name] [name]'s hypertension is well controlled on losartan and hydrochlorothiazide.   BP Readings from Last 3 Encounters:  08/08/20 136/74  06/27/20 118/71  06/13/20 (!) 143/74   Lab Results  Component Value Date   CREATININE 0.73 06/27/2020   CREATININE 0.76 04/04/2020   CREATININE 0.80 10/12/2019   Assessment/Plan:   1. Type 2 diabetes mellitus in remission [facility])  [name] [name] agreed to increase metformin to 500 mg TID, and we will refill for 90 days.   - metFORMIN (GLUCOPHAGE) 500 MG tablet; Take 1 tablet (500 mg total) by mouth with breakfast, with lunch, and  with evening meal.  Dispense: 270 tablet; Refill: 0  2. Hypertension associated with type 2 diabetes mellitus ([place]) [name] [name] is working on healthy weight loss and exercise to improve blood pressure control. We will watch for signs of hypotension as she continues her lifestyle modifications. We will refill hydrochlorothiazide for 90 days with no refill.  - hydrochlorothiazide (HYDRODIURIL) 25 MG tablet; Take 1 tablet (25 mg total) by mouth daily.  Dispense: 90 tablet; Refill: 0  3. Class 1 obesity with serious comorbidity and body mass index (BMI) of 30.0 to 30.9 in adult, unspecified obesity type [name] [name] is currently in the action stage of change. As such, her goal is to continue with weight loss efforts. She has agreed to the Category 2 Plan.   Exercise goals: No exercise has been prescribed at this time, as she is recuperating from her surgery.  Behavioral modification strategies: decreasing simple carbohydrates and planning for success.  [name] [name] has agreed to follow-up with our clinic in 3 weeks.  Objective:   Blood pressure 136/74, pulse 62, temperature 98.7 F (37.1 C), height _0  (1.6 m), weight 153 lb (69.4 kg), SpO2 100 %. Body mass index is 27.1 kg/m.  General: Cooperative, alert, well developed, in no acute distress. HEENT: Conjunctivae and lids unremarkable. Cardiovascular: Regular rhythm.  Lungs: Normal work of breathing. Neurologic: No focal deficits.   Lab Results  Component Value Date   CREATININE 0.73 06/27/2020  BUN 15 06/27/2020   NA 132 (L) 06/27/2020   K 4.3 06/27/2020   CL 92 (L) 06/27/2020   CO2 24 06/27/2020   Lab Results  Component Value Date   ALT 23 06/27/2020   AST 22 06/27/2020   ALKPHOS 74 06/27/2020   BILITOT 0.4 06/27/2020   Lab Results  Component Value Date   HGBA1C 4.8 04/04/2020   HGBA1C 4.6 (L) 10/12/2019   HGBA1C 4.8 05/04/2019   HGBA1C 5.1 11/03/2018   HGBA1C 5.4 08/02/2018   Lab Results  Component Value Date   INSULIN 4.0  04/04/2020   INSULIN 3.5 10/12/2019   INSULIN 6.9 08/02/2018   INSULIN 7.0 04/27/2018   Lab Results  Component Value Date   TSH 2.100 05/04/2019   Lab Results  Component Value Date   CHOL 174 04/04/2020   HDL 85 04/04/2020   LDLCALC 77 04/04/2020   TRIG 63 04/04/2020   CHOLHDL 2.7 04/09/2016   Lab Results  Component Value Date   WBC 4.4 06/27/2020   HGB 14.3 06/27/2020   HCT 41.1 06/27/2020   MCV 91 06/27/2020   PLT 235 06/27/2020   Lab Results  Component Value Date   IRON 51 06/27/2020   TIBC 284 06/27/2020   FERRITIN 100 06/27/2020    Obesity Behavioral Intervention:   Approximately 15 minutes were spent on the discussion below.  ASK: We discussed the diagnosis of obesity with [name] [name] today and [name] [name] agreed to give [place] permission to discuss obesity behavioral modification therapy today.  ASSESS: Shellene has the diagnosis of obesity and her BMI today is 27.11. Anisha is in the action stage of change.   ADVISE: Kanyia was educated on the multiple health risks of obesity as well as the benefit of weight loss to improve her health. She was advised of the need for long term treatment and the importance of lifestyle modifications to improve her current health and to decrease her risk of future health problems.  AGREE: Multiple dietary modification options and treatment options were discussed and Kortnie agreed to follow the recommendations documented in the above note.  ARRANGE: Jniyah was educated on the importance of frequent visits to treat obesity as outlined per CMS and USPSTF guidelines and agreed to schedule her next follow up appointment today.  Attestation Statements:   Reviewed by clinician on day of visit: allergies, medications, problem list, medical history, surgical history, family history, social history, and previous encounter notes.   [name], am acting as Location manager for [name], FNP-C.  I have reviewed the above documentation  for accuracy and completeness, and I agree with the above. -  Georgianne Fick, FNP

## 2020-08-13 NOTE — Telephone Encounter (Signed)
The pt has clarified this surgeon is different. I spoke with a staff member at [facility] in [place]. They ask for clearance and clarify that the previous letters are not needed, only a letter for this current request. I spoke with Dr Jaynee Eagles and the final letter was written. Pending MD signature then will fax to [phone] (number confirmed with staff at [facility]).

## 2020-08-13 NOTE — Telephone Encounter (Signed)
Signed clearance letter faxed to [facility] @ [phone]. Received a receipt of confirmation.

## 2020-08-28 ENCOUNTER — Other Ambulatory Visit: Payer: Self-pay

## 2020-08-28 ENCOUNTER — Ambulatory Visit (INDEPENDENT_AMBULATORY_CARE_PROVIDER_SITE_OTHER): Payer: Medicare Other | Admitting: Family Medicine

## 2020-08-28 ENCOUNTER — Encounter (INDEPENDENT_AMBULATORY_CARE_PROVIDER_SITE_OTHER): Payer: Self-pay | Admitting: Family Medicine

## 2020-08-28 VITALS — BP 143/75 | HR 64 | Temp 97.4°F | Ht 63.0 in | Wt 153.0 lb

## 2020-08-28 DIAGNOSIS — I152 Hypertension secondary to endocrine disorders: Secondary | ICD-10-CM | POA: Diagnosis not present

## 2020-08-28 DIAGNOSIS — E669 Obesity, unspecified: Secondary | ICD-10-CM

## 2020-08-28 DIAGNOSIS — Z6830 Body mass index (BMI) 30.0-30.9, adult: Secondary | ICD-10-CM

## 2020-08-28 DIAGNOSIS — E1169 Type 2 diabetes mellitus with other specified complication: Secondary | ICD-10-CM

## 2020-08-28 DIAGNOSIS — E1159 Type 2 diabetes mellitus with other circulatory complications: Secondary | ICD-10-CM | POA: Diagnosis not present

## 2020-08-28 MED ORDER — LOSARTAN POTASSIUM 100 MG PO TABS: 100.0000 mg | ORAL_TABLET | Freq: Every day | ORAL | 0 refills | Status: DC

## 2020-08-28 MED ORDER — HYDROCHLOROTHIAZIDE 25 MG PO TABS: 25.0000 mg | ORAL_TABLET | Freq: Every day | ORAL | 0 refills | Status: DC

## 2020-08-29 ENCOUNTER — Other Ambulatory Visit (INDEPENDENT_AMBULATORY_CARE_PROVIDER_SITE_OTHER): Payer: Self-pay | Admitting: Family Medicine

## 2020-08-29 DIAGNOSIS — E1159 Type 2 diabetes mellitus with other circulatory complications: Secondary | ICD-10-CM

## 2020-08-29 NOTE — Telephone Encounter (Signed)
This patient was last seen by Jake Bathe, FNP, and currently has an upcoming appt scheduled on 09/26/20 with her.  Pt was also seen yesterday (08/28/20) by Arrie Aran.

## 2020-08-29 NOTE — Progress Notes (Signed)
Chief Complaint:   OBESITY [name] [name] is here to discuss her progress with her obesity treatment plan along with follow-up of her obesity related diagnoses. [name] [name] is on the Category 2 Plan and states she is following her eating plan approximately 50% of the time. [name] [name] states she is doing cardio and strengthening for 45-60 minutes 4 times per week.  Today's visit was #: 41 Starting weight: 258 lbs Starting date: 04/27/2018 Today's weight: 153 lbs  Today's date: 08/28/2020 Total lbs lost to date: 105 Total lbs lost since last in-office visit: 0  Interim History: [name] [name] has cancelled her leg lift for 09/01/2020 due to scheduling difficulties. She has maintained her weight. Her goal weight is 145 -150 but she would like to reach 145 lbs. She does tend to eat off plan at times on purpose but then is able to redirect and get back to plan adherence.   Subjective:   1. Type 2 diabetes mellitus with other specified complication, without long-term current use of insulin (HCC) [name] [name] is on metformin TID for polyphagia, but she does not notice that it helps with appetite. Last A1c was 4.8.  Lab Results  Component Value Date   HGBA1C 4.8 04/04/2020   HGBA1C 4.6 (L) 10/12/2019   HGBA1C 4.8 05/04/2019   Lab Results  Component Value Date   LDLCALC 77 04/04/2020   CREATININE 0.73 06/27/2020   Lab Results  Component Value Date   INSULIN 4.0 04/04/2020   INSULIN 3.5 10/12/2019   INSULIN 6.9 08/02/2018   INSULIN 7.0 04/27/2018   2. Hypertension associated with type 2 diabetes mellitus ([place]) [name] [name]'s blood pressure is elevated again today. She notes her blood pressure has been elevated at other doctor's visits.  BP Readings from Last 3 Encounters:  08/28/20 (!) 143/75  08/08/20 136/74  06/27/20 118/71   Lab Results  Component Value Date   CREATININE 0.73 06/27/2020   CREATININE 0.76 04/04/2020   CREATININE 0.80 10/12/2019   Assessment/Plan:   1. Type 2 diabetes mellitus with other  specified complication, without long-term current use of insulin (HCC) [name] [name] will continues metformin, and she will continue to follow up as directed.  2. Hypertension associated with type 2 diabetes mellitus ([place]) [name] [name] is working on healthy weight loss and exercise to improve blood pressure control. We will watch for signs of hypotension as she continues her lifestyle modifications. [name] [name] agreed to increase losartan to 100 mg q daily and we will refill for 90 days with no refills. We will refill hydrochlorothiazide for 90 days with no refills.  - hydrochlorothiazide (HYDRODIURIL) 25 MG tablet; Take 1 tablet (25 mg total) by mouth daily.  Dispense: 90 tablet; Refill: 0 - losartan (COZAAR) 100 MG tablet; Take 1 tablet (100 mg total) by mouth daily.  Dispense: 90 tablet; Refill: 0  3. Class 1 obesity with serious comorbidity and body mass index (BMI) of 30.0 to 30.9 in adult, unspecified obesity type [name] [name] is currently in the action stage of change. As such, her goal is to continue with weight loss efforts. She has agreed to the Category 2 Plan.   Exercise goals: As is.  Behavioral modification strategies: decreasing simple carbohydrates.  [name] [name] has agreed to follow-up with our clinic in 4 weeks.   Objective:   Blood pressure (!) 143/75, pulse 64, temperature (!) 97.4 F (36.3 C), height _0  (1.6 m), weight 153 lb (69.4 kg), SpO2 99 %. Body mass index is 27.1 kg/m.  General: Cooperative, alert, well developed, in no  acute distress. HEENT: Conjunctivae and lids unremarkable. Cardiovascular: Regular rhythm.  Lungs: Normal work of breathing. Neurologic: No focal deficits.   Lab Results  Component Value Date   CREATININE 0.73 06/27/2020   BUN 15 06/27/2020   NA 132 (L) 06/27/2020   K 4.3 06/27/2020   CL 92 (L) 06/27/2020   CO2 24 06/27/2020   Lab Results  Component Value Date   ALT 23 06/27/2020   AST 22 06/27/2020   ALKPHOS 74 06/27/2020   BILITOT 0.4 06/27/2020    Lab Results  Component Value Date   HGBA1C 4.8 04/04/2020   HGBA1C 4.6 (L) 10/12/2019   HGBA1C 4.8 05/04/2019   HGBA1C 5.1 11/03/2018   HGBA1C 5.4 08/02/2018   Lab Results  Component Value Date   INSULIN 4.0 04/04/2020   INSULIN 3.5 10/12/2019   INSULIN 6.9 08/02/2018   INSULIN 7.0 04/27/2018   Lab Results  Component Value Date   TSH 2.100 05/04/2019   Lab Results  Component Value Date   CHOL 174 04/04/2020   HDL 85 04/04/2020   LDLCALC 77 04/04/2020   TRIG 63 04/04/2020   CHOLHDL 2.7 04/09/2016   Lab Results  Component Value Date   WBC 4.4 06/27/2020   HGB 14.3 06/27/2020   HCT 41.1 06/27/2020   MCV 91 06/27/2020   PLT 235 06/27/2020   Lab Results  Component Value Date   IRON 51 06/27/2020   TIBC 284 06/27/2020   FERRITIN 100 06/27/2020    Obesity Behavioral Intervention:   Approximately 15 minutes were spent on the discussion below.  ASK: We discussed the diagnosis of obesity with [name] today and [name] [name] agreed to give [place] permission to discuss obesity behavioral modification therapy today.  ASSESS: [name] [name] has the diagnosis of obesity and her BMI today is 27.11. [name] [name] is in the action stage of change.   ADVISE: [name] [name] was educated on the multiple health risks of obesity as well as the benefit of weight loss to improve her health. She was advised of the need for long term treatment and the importance of lifestyle modifications to improve her current health and to decrease her risk of future health problems.  AGREE: Multiple dietary modification options and treatment options were discussed and [name] [name] agreed to follow the recommendations documented in the above note.  ARRANGE: [name] [name] was educated on the importance of frequent visits to treat obesity as outlined per CMS and USPSTF guidelines and agreed to schedule her next follow up appointment today.  Attestation Statements:   Reviewed by clinician on day of visit: allergies, medications, problem  list, medical history, surgical history, family history, social history, and previous encounter notes.   [name], am acting as Location manager for [name], FNP-C.  I have reviewed the above documentation for accuracy and completeness, and I agree with the above. -  Georgianne Fick, FNP

## 2020-09-02 ENCOUNTER — Encounter (INDEPENDENT_AMBULATORY_CARE_PROVIDER_SITE_OTHER): Payer: Self-pay | Admitting: Family Medicine

## 2020-09-22 DIAGNOSIS — Z20828 Contact with and (suspected) exposure to other viral communicable diseases: Secondary | ICD-10-CM | POA: Diagnosis not present

## 2020-09-22 DIAGNOSIS — U071 COVID-19: Secondary | ICD-10-CM | POA: Diagnosis not present

## 2020-09-22 DIAGNOSIS — R059 Cough, unspecified: Secondary | ICD-10-CM | POA: Diagnosis not present

## 2020-09-24 DIAGNOSIS — E7800 Pure hypercholesterolemia, unspecified: Secondary | ICD-10-CM | POA: Diagnosis not present

## 2020-09-24 DIAGNOSIS — I10 Essential (primary) hypertension: Secondary | ICD-10-CM | POA: Diagnosis not present

## 2020-09-24 DIAGNOSIS — Z8673 Personal history of transient ischemic attack (TIA), and cerebral infarction without residual deficits: Secondary | ICD-10-CM | POA: Diagnosis not present

## 2020-09-24 DIAGNOSIS — U071 COVID-19: Secondary | ICD-10-CM | POA: Diagnosis not present

## 2020-09-24 DIAGNOSIS — G629 Polyneuropathy, unspecified: Secondary | ICD-10-CM | POA: Diagnosis not present

## 2020-09-26 ENCOUNTER — Ambulatory Visit (INDEPENDENT_AMBULATORY_CARE_PROVIDER_SITE_OTHER): Payer: Medicare Other | Admitting: Family Medicine

## 2020-10-08 ENCOUNTER — Encounter (INDEPENDENT_AMBULATORY_CARE_PROVIDER_SITE_OTHER): Payer: Self-pay

## 2020-10-10 ENCOUNTER — Other Ambulatory Visit: Payer: Self-pay

## 2020-10-10 ENCOUNTER — Telehealth (INDEPENDENT_AMBULATORY_CARE_PROVIDER_SITE_OTHER): Payer: Medicare Other | Admitting: Family Medicine

## 2020-10-10 DIAGNOSIS — Z6830 Body mass index (BMI) 30.0-30.9, adult: Secondary | ICD-10-CM

## 2020-10-10 DIAGNOSIS — E1159 Type 2 diabetes mellitus with other circulatory complications: Secondary | ICD-10-CM | POA: Diagnosis not present

## 2020-10-10 DIAGNOSIS — F3289 Other specified depressive episodes: Secondary | ICD-10-CM | POA: Diagnosis not present

## 2020-10-10 DIAGNOSIS — E669 Obesity, unspecified: Secondary | ICD-10-CM | POA: Diagnosis not present

## 2020-10-10 DIAGNOSIS — I152 Hypertension secondary to endocrine disorders: Secondary | ICD-10-CM

## 2020-10-10 DIAGNOSIS — E119 Type 2 diabetes mellitus without complications: Secondary | ICD-10-CM

## 2020-10-10 MED ORDER — LOSARTAN POTASSIUM 100 MG PO TABS: 100.0000 mg | ORAL_TABLET | Freq: Every day | ORAL | 0 refills | Status: DC

## 2020-10-10 MED ORDER — BUPROPION HCL ER (SR) 150 MG PO TB12: 150.0000 mg | ORAL_TABLET | Freq: Every day | ORAL | 0 refills | Status: DC

## 2020-10-10 MED ORDER — METFORMIN HCL 500 MG PO TABS: 500.0000 mg | ORAL_TABLET | Freq: Three times a day (TID) | ORAL | 0 refills | Status: DC

## 2020-10-14 NOTE — Progress Notes (Signed)
TeleHealth Visit:  Due to the COVID-19 pandemic, this visit was completed with telemedicine (audio/video) technology to reduce patient and provider exposure as well as to preserve personal protective equipment.   [name] [name] has verbally consented to this TeleHealth visit. The patient is located at home, the provider is located at the [facility] office. The participants in this visit include the listed provider and patien. The visit was conducted today via MyChart Video.   Chief Complaint: OBESITY [name] [name] is here to discuss her progress with her obesity treatment plan along with follow-up of her obesity related diagnoses. [name] [name] is on the Category 2 Plan and states she is following her eating plan approximately 25% of the time. [name] [name] states she is not exercising.  Today's visit was #: 108 Starting weight: 258 lbs Starting date: 04/27/2018  Interim History: [name] [name] reports home weight is 163. 2 lbs reflecting a weigh gain since last office visit (weigned 153 lbs on 08/28/2020). She notes being off plan quite a bit-[name] [name] reports eating too many carbs and not enough protein. Her wt goal is 145 lbs. [name] [name] had a bout with COVID recently. She has been exercising less. She plans to get back on plan tomorrow. [name] [name] is taking an all exclusive trip to [name] on February 5th.   Subjective:   1.Type 2 diabetes mellitus in remission ([place])   [name] [name]'s diabetes is very well controlled. Last A1c was 4.8. [name] [name] is on metformin TID for appetite.  Lab Results  Component Value Date   HGBA1C 4.8 04/04/2020   HGBA1C 4.6 (L) 10/12/2019   HGBA1C 4.8 05/04/2019   Lab Results  Component Value Date   LDLCALC 77 04/04/2020   CREATININE 0.73 06/27/2020   Lab Results  Component Value Date   INSULIN 4.0 04/04/2020   INSULIN 3.5 10/12/2019   INSULIN 6.9 08/02/2018   INSULIN 7.0 04/27/2018   2. Hypertension associated with type 2 diabetes mellitus ([place]) [name] [name]'s home blood pressure reading was  116/74 today. Hypertension well controlled on losartan 100 and HCTZ 25 mg.   BP Readings from Last 3 Encounters:  08/28/20 (!) 143/75  08/08/20 136/74  06/27/20 118/71   Lab Results  Component Value Date   CREATININE 0.73 06/27/2020   CREATININE 0.76 04/04/2020   CREATININE 0.80 10/12/2019   3. Other depression   [name] [name] notes she feels she eats to fill "fill a hole" in herself. She struggles with carb cravings. She has seen Dr Mallie Mussel in the past (6-8 times) which [name] feels was beneficila for her. She completed therapy with her and has names provided by Dr. Mallie Mussel should she decide to continue therapy.   Assessment/Plan:   1. Type 2 diabetes mellitus in remission ([name] [name])  We will refill metformin 500 mg TID #270 no refill.  - metFORMIN (GLUCOPHAGE) 500 MG tablet; Take 1 tablet (500 mg total) by mouth with breakfast, with lunch, and with evening meal.  Dispense: 270 tablet; Refill: 0  2. Hypertension associated with type 2 diabetes mellitus ([name] [name]) We will refill losartan 100 mg daily #90 no refill.  - losartan (COZAAR) 100 MG tablet; Take 1 tablet (100 mg total) by mouth daily.  Dispense: 90 tablet; Refill: 0  3. Other depression Behavior modification techniques were discussed today to help [name] [name] deal with her emotional/non-hunger eating behaviors.[name] [name] will start a new bupropion prescription 150 mg daily #90 no refill. She has no history of glaucoma or seizures.  - buPROPion (WELLBUTRIN SR) 150 MG 12 hr tablet; Take 1 tablet (150  mg total) by mouth daily with breakfast.  Dispense: 30 tablet; Refill: 0  4. Class 1 obesity with serious comorbidity and body mass index (BMI) of 30.0 to 30.9 in adult, unspecified obesity type  [name] [name] is currently in the action stage of change. As such, her goal is to continue with weight loss efforts. She has agreed to the Category 2 Plan.   Exercise goals: As is.  Behavioral modification strategies: increasing lean protein intake, decreasing  simple carbohydrates and avoiding temptations.  [name] has agreed to follow-up with our clinic in 4-5 weeks after her trip to [name].  Objective:   VITALS: Per patient if applicable, see vitals. GENERAL: Alert and in no acute distress. CARDIOPULMONARY: No increased WOB. Speaking in clear sentences.  PSYCH: Pleasant and cooperative. Speech normal rate and rhythm. Affect is appropriate. Insight and judgement are appropriate. Attention is focused, linear, and appropriate.  NEURO: Oriented as arrived to appointment on time with no prompting.   Lab Results  Component Value Date   CREATININE 0.73 06/27/2020   BUN 15 06/27/2020   NA 132 (L) 06/27/2020   K 4.3 06/27/2020   CL 92 (L) 06/27/2020   CO2 24 06/27/2020   Lab Results  Component Value Date   ALT 23 06/27/2020   AST 22 06/27/2020   ALKPHOS 74 06/27/2020   BILITOT 0.4 06/27/2020   Lab Results  Component Value Date   HGBA1C 4.8 04/04/2020   HGBA1C 4.6 (L) 10/12/2019   HGBA1C 4.8 05/04/2019   HGBA1C 5.1 11/03/2018   HGBA1C 5.4 08/02/2018   Lab Results  Component Value Date   INSULIN 4.0 04/04/2020   INSULIN 3.5 10/12/2019   INSULIN 6.9 08/02/2018   INSULIN 7.0 04/27/2018   Lab Results  Component Value Date   TSH 2.100 05/04/2019   Lab Results  Component Value Date   CHOL 174 04/04/2020   HDL 85 04/04/2020   LDLCALC 77 04/04/2020   TRIG 63 04/04/2020   CHOLHDL 2.7 04/09/2016   Lab Results  Component Value Date   WBC 4.4 06/27/2020   HGB 14.3 06/27/2020   HCT 41.1 06/27/2020   MCV 91 06/27/2020   PLT 235 06/27/2020   Lab Results  Component Value Date   IRON 51 06/27/2020   TIBC 284 06/27/2020   FERRITIN 100 06/27/2020    Attestation Statements:   Reviewed by clinician on day of visit: allergies, medications, problem list, medical history, surgical history, family history, social history, and previous encounter notes.   Tula Nakayama, am acting as Location manager for Georgianne Fick,  FNP.  I have reviewed the above documentation for accuracy and completeness, and I agree with the above. - Georgianne Fick, FNP

## 2020-10-15 ENCOUNTER — Encounter (INDEPENDENT_AMBULATORY_CARE_PROVIDER_SITE_OTHER): Payer: Self-pay | Admitting: Family Medicine

## 2020-10-22 DIAGNOSIS — D352 Benign neoplasm of pituitary gland: Secondary | ICD-10-CM | POA: Diagnosis not present

## 2020-10-22 DIAGNOSIS — C751 Malignant neoplasm of pituitary gland: Secondary | ICD-10-CM | POA: Diagnosis not present

## 2020-11-04 ENCOUNTER — Other Ambulatory Visit (INDEPENDENT_AMBULATORY_CARE_PROVIDER_SITE_OTHER): Payer: Self-pay | Admitting: Family Medicine

## 2020-11-04 DIAGNOSIS — F3289 Other specified depressive episodes: Secondary | ICD-10-CM

## 2020-11-04 NOTE — Telephone Encounter (Signed)
Last seen by Jake Bathe, FNP

## 2020-11-04 NOTE — Telephone Encounter (Signed)
Refill request

## 2020-11-14 ENCOUNTER — Other Ambulatory Visit: Payer: Self-pay

## 2020-11-14 ENCOUNTER — Ambulatory Visit (INDEPENDENT_AMBULATORY_CARE_PROVIDER_SITE_OTHER): Payer: Medicare Other | Admitting: Family Medicine

## 2020-11-14 ENCOUNTER — Encounter (INDEPENDENT_AMBULATORY_CARE_PROVIDER_SITE_OTHER): Payer: Self-pay | Admitting: Family Medicine

## 2020-11-14 VITALS — BP 136/74 | HR 66 | Temp 97.6°F | Ht 63.0 in | Wt 157.0 lb

## 2020-11-14 DIAGNOSIS — E669 Obesity, unspecified: Secondary | ICD-10-CM

## 2020-11-14 DIAGNOSIS — Z6830 Body mass index (BMI) 30.0-30.9, adult: Secondary | ICD-10-CM

## 2020-11-14 DIAGNOSIS — F3289 Other specified depressive episodes: Secondary | ICD-10-CM

## 2020-11-14 DIAGNOSIS — I152 Hypertension secondary to endocrine disorders: Secondary | ICD-10-CM

## 2020-11-14 DIAGNOSIS — E1159 Type 2 diabetes mellitus with other circulatory complications: Secondary | ICD-10-CM | POA: Diagnosis not present

## 2020-11-18 ENCOUNTER — Encounter (INDEPENDENT_AMBULATORY_CARE_PROVIDER_SITE_OTHER): Payer: Self-pay | Admitting: Family Medicine

## 2020-11-18 DIAGNOSIS — N6310 Unspecified lump in the right breast, unspecified quadrant: Secondary | ICD-10-CM | POA: Diagnosis not present

## 2020-11-18 NOTE — Progress Notes (Signed)
Chief Complaint:   OBESITY [name] [name] is here to discuss her progress with her obesity treatment plan along with follow-up of her obesity related diagnoses. Sup[place] is on the Category 2 Plan and states she is following her eating plan approximately 85% of the time. [name] [name] states she is doing cardio for 30 minutes 5-6 times per week.  Today's visit was #: 3 Starting weight: 258 lbs Starting date: 04/27/2018 Today's weight: 157 lbs Today's date: 11/14/2020 Total lbs lost to date: 101 lbs Total lbs lost since last in-office visit: (+4)  Interim History: [name] [name] went on a recent all inclusive trip to [name].  She is up 4 pounds today.  Her goal is 145 pounds.  Weight is 157 pounds today.  She has been back on plan for the past week and a half.  Subjective:   1. Hypertension associated with type 2 diabetes mellitus ([name] [name]) Well controlled.  On Losartan 100 mg and HCTZ 25 mg.  BP Readings from Last 3 Encounters:  11/14/20 136/74  08/28/20 (!) 143/75  08/08/20 136/74   2. Other depression with emotional eating She notes constipation with bupropion.  She does not feel it is helping with cravings.  Assessment/Plan:   1. Hypertension associated with type 2 diabetes mellitus (HCC) Continue losartan and HCTZ.  2. Other depression with emotional eating Discontinue bupropion.  3. Class 1 obesity with serious comorbidity and body mass index (BMI) of 30.0 to 30.9 in adult, unspecified obesity type  [name] [name] is currently in the action stage of change. As such, her goal is to continue with weight loss efforts. She has agreed to the Category 2 Plan.   Exercise goals: As is.  Behavioral modification strategies: decreasing simple carbohydrates.  [name] [name] has agreed to follow-up with our clinic in 4 weeks, fasting.  Objective:   Blood pressure 136/74, pulse 66, temperature 97.6 F (36.4 C), height _0  (1.6 m), weight 157 lb (71.2 kg), SpO2 100 %. Body mass index is 27.81 kg/m.  General:  Cooperative, alert, well developed, in no acute distress. HEENT: Conjunctivae and lids unremarkable. Cardiovascular: Regular rhythm.  Lungs: Normal work of breathing. Neurologic: No focal deficits.   Lab Results  Component Value Date   CREATININE 0.73 06/27/2020   BUN 15 06/27/2020   NA 132 (L) 06/27/2020   K 4.3 06/27/2020   CL 92 (L) 06/27/2020   CO2 24 06/27/2020   Lab Results  Component Value Date   ALT 23 06/27/2020   AST 22 06/27/2020   ALKPHOS 74 06/27/2020   BILITOT 0.4 06/27/2020   Lab Results  Component Value Date   HGBA1C 4.8 04/04/2020   HGBA1C 4.6 (L) 10/12/2019   HGBA1C 4.8 05/04/2019   HGBA1C 5.1 11/03/2018   HGBA1C 5.4 08/02/2018   Lab Results  Component Value Date   INSULIN 4.0 04/04/2020   INSULIN 3.5 10/12/2019   INSULIN 6.9 08/02/2018   INSULIN 7.0 04/27/2018   Lab Results  Component Value Date   TSH 2.100 05/04/2019   Lab Results  Component Value Date   CHOL 174 04/04/2020   HDL 85 04/04/2020   LDLCALC 77 04/04/2020   TRIG 63 04/04/2020   CHOLHDL 2.7 04/09/2016   Lab Results  Component Value Date   WBC 4.4 06/27/2020   HGB 14.3 06/27/2020   HCT 41.1 06/27/2020   MCV 91 06/27/2020   PLT 235 06/27/2020   Lab Results  Component Value Date   IRON 51 06/27/2020   TIBC 284 06/27/2020  FERRITIN 100 06/27/2020   Obesity Behavioral Intervention:   Approximately 15 minutes were spent on the discussion below.  ASK: We discussed the diagnosis of obesity with [name] [name] today and [name] [name] agreed to give [place] permission to discuss obesity behavioral modification therapy today.  ASSESS: [name] [name] has the diagnosis of obesity and her BMI today is 27.9. [name] [name] is in the action stage of change.   ADVISE: [name] [name] was educated on the multiple health risks of obesity as well as the benefit of weight loss to improve her health. She was advised of the need for long term treatment and the importance of lifestyle modifications to improve her current health  and to decrease her risk of future health problems.  AGREE: Multiple dietary modification options and treatment options were discussed and [name] [name] agreed to follow the recommendations documented in the above note.  ARRANGE: [name] [name] was educated on the importance of frequent visits to treat obesity as outlined per CMS and USPSTF guidelines and agreed to schedule her next follow up appointment today.  Attestation Statements:   Reviewed by clinician on day of visit: allergies, medications, problem list, medical history, surgical history, family history, social history, and previous encounter notes.  I, Water quality scientist, CMA, am acting as Location manager for [name], [place].  I have reviewed the above documentation for accuracy and completeness, and I agree with the above. -  Georgianne Fick, FNP

## 2020-11-19 DIAGNOSIS — L570 Actinic keratosis: Secondary | ICD-10-CM | POA: Diagnosis not present

## 2020-11-19 DIAGNOSIS — L821 Other seborrheic keratosis: Secondary | ICD-10-CM | POA: Diagnosis not present

## 2020-11-19 DIAGNOSIS — L720 Epidermal cyst: Secondary | ICD-10-CM | POA: Diagnosis not present

## 2020-11-20 DIAGNOSIS — D352 Benign neoplasm of pituitary gland: Secondary | ICD-10-CM | POA: Diagnosis not present

## 2020-12-12 ENCOUNTER — Other Ambulatory Visit: Payer: Self-pay

## 2020-12-12 ENCOUNTER — Ambulatory Visit (INDEPENDENT_AMBULATORY_CARE_PROVIDER_SITE_OTHER): Payer: Medicare Other | Admitting: Family Medicine

## 2020-12-12 ENCOUNTER — Encounter (INDEPENDENT_AMBULATORY_CARE_PROVIDER_SITE_OTHER): Payer: Self-pay | Admitting: Family Medicine

## 2020-12-12 VITALS — BP 147/81 | HR 66 | Temp 97.5°F | Ht 63.0 in | Wt 156.0 lb

## 2020-12-12 DIAGNOSIS — E538 Deficiency of other specified B group vitamins: Secondary | ICD-10-CM | POA: Diagnosis not present

## 2020-12-12 DIAGNOSIS — E559 Vitamin D deficiency, unspecified: Secondary | ICD-10-CM | POA: Diagnosis not present

## 2020-12-12 DIAGNOSIS — D509 Iron deficiency anemia, unspecified: Secondary | ICD-10-CM | POA: Diagnosis not present

## 2020-12-12 DIAGNOSIS — Z6841 Body Mass Index (BMI) 40.0 and over, adult: Secondary | ICD-10-CM | POA: Diagnosis not present

## 2020-12-12 DIAGNOSIS — E1169 Type 2 diabetes mellitus with other specified complication: Secondary | ICD-10-CM | POA: Diagnosis not present

## 2020-12-12 DIAGNOSIS — I152 Hypertension secondary to endocrine disorders: Secondary | ICD-10-CM | POA: Diagnosis not present

## 2020-12-12 DIAGNOSIS — E1159 Type 2 diabetes mellitus with other circulatory complications: Secondary | ICD-10-CM | POA: Diagnosis not present

## 2020-12-12 DIAGNOSIS — E7849 Other hyperlipidemia: Secondary | ICD-10-CM | POA: Diagnosis not present

## 2020-12-12 MED ORDER — LOSARTAN POTASSIUM 100 MG PO TABS: 100.0000 mg | ORAL_TABLET | Freq: Every day | ORAL | 0 refills | Status: DC

## 2020-12-13 LAB — COMPREHENSIVE METABOLIC PANEL
ALT: 11 IU/L (ref 0–32)
AST: 24 IU/L (ref 0–40)
Albumin/Globulin Ratio: 1.8 (ref 1.2–2.2)
Albumin: 4.1 g/dL (ref 3.8–4.8)
Alkaline Phosphatase: 70 IU/L (ref 44–121)
BUN/Creatinine Ratio: 16 (ref 12–28)
BUN: 11 mg/dL (ref 8–27)
Bilirubin Total: 0.5 mg/dL (ref 0.0–1.2)
CO2: 24 mmol/L (ref 20–29)
Calcium: 9.2 mg/dL (ref 8.7–10.3)
Chloride: 95 mmol/L — ABNORMAL LOW (ref 96–106)
Creatinine, Ser: 0.67 mg/dL (ref 0.57–1.00)
Globulin, Total: 2.3 g/dL (ref 1.5–4.5)
Glucose: 76 mg/dL (ref 65–99)
Potassium: 4.4 mmol/L (ref 3.5–5.2)
Sodium: 133 mmol/L — ABNORMAL LOW (ref 134–144)
Total Protein: 6.4 g/dL (ref 6.0–8.5)
eGFR: 95 mL/min/{1.73_m2} (ref >59)

## 2020-12-13 LAB — CBC WITH DIFFERENTIAL/PLATELET
Basophils Absolute: 0.1 10*3/uL (ref 0.0–0.2)
Basos: 1 %
EOS (ABSOLUTE): 0.1 10*3/uL (ref 0.0–0.4)
Eos: 2 %
Hematocrit: 39.7 % (ref 34.0–46.6)
Hemoglobin: 13.3 g/dL (ref 11.1–15.9)
Immature Grans (Abs): 0 10*3/uL (ref 0.0–0.1)
Immature Granulocytes: 0 %
Lymphocytes Absolute: 1.1 10*3/uL (ref 0.7–3.1)
Lymphs: 21 %
MCH: 30 pg (ref 26.6–33.0)
MCHC: 33.5 g/dL (ref 31.5–35.7)
MCV: 90 fL (ref 79–97)
Monocytes Absolute: 0.4 10*3/uL (ref 0.1–0.9)
Monocytes: 8 %
Neutrophils Absolute: 3.4 10*3/uL (ref 1.4–7.0)
Neutrophils: 68 %
Platelets: 227 10*3/uL (ref 150–450)
RBC: 4.43 x10E6/uL (ref 3.77–5.28)
RDW: 12.6 % (ref 11.7–15.4)
WBC: 5 10*3/uL (ref 3.4–10.8)

## 2020-12-13 LAB — IRON AND TIBC
Iron Saturation: 18 % (ref 15–55)
Iron: 52 ug/dL (ref 27–139)
Total Iron Binding Capacity: 290 ug/dL (ref 250–450)
UIBC: 238 ug/dL (ref 118–369)

## 2020-12-13 LAB — LIPID PANEL WITH LDL/HDL RATIO
Cholesterol, Total: 150 mg/dL (ref 100–199)
HDL: 90 mg/dL (ref >39)
LDL Chol Calc (NIH): 48 mg/dL (ref 0–99)
LDL/HDL Ratio: 0.5 ratio (ref 0.0–3.2)
Triglycerides: 58 mg/dL (ref 0–149)
VLDL Cholesterol Cal: 12 mg/dL (ref 5–40)

## 2020-12-13 LAB — VITAMIN D 25 HYDROXY (VIT D DEFICIENCY, FRACTURES): Vit D, 25-Hydroxy: 45.3 ng/mL (ref 30.0–100.0)

## 2020-12-13 LAB — FERRITIN: Ferritin: 83 ng/mL (ref 15–150)

## 2020-12-13 LAB — VITAMIN B12: Vitamin B-12: 414 pg/mL (ref 232–1245)

## 2020-12-13 LAB — HEMOGLOBIN A1C
Est. average glucose Bld gHb Est-mCnc: 88 mg/dL
Hgb A1c MFr Bld: 4.7 % — ABNORMAL LOW (ref 4.8–5.6)

## 2020-12-13 LAB — FOLATE: Folate: 11.7 ng/mL (ref >3.0)

## 2020-12-13 LAB — INSULIN, RANDOM: INSULIN: 1.5 u[IU]/mL — ABNORMAL LOW (ref 2.6–24.9)

## 2020-12-16 NOTE — Progress Notes (Signed)
Chief Complaint:   OBESITY [name] [name] is here to discuss her progress with her obesity treatment plan along with follow-up of her obesity related diagnoses. [name] [name] is on the Category 2 Plan and states she is following her eating plan approximately 50% of the time. [name] [name] states she is doing cardio/strengthening for 60 minutes 5 times per week.  Today's visit was #: 86 Starting weight: 258 lbs Starting date: 04/27/2018 Today's weight: 156 lbs Today's date: 12/12/2020 Total lbs lost to date: 102 lbs Total lbs lost since last in-office visit: +1  Interim History: [name] [name] notes being off plan considerably.  Since Christmas she has decided to consider herself at her goal weight of <160 pounds.  She admits to having too many snacks.  Subjective:   1. Iron deficiency anemia History of gastric bypass.  On 325 mg iron twice daily.  Last Hgb within normal limits (14.3).  Last ferritin within normal limits (100).  CBC Latest Ref Rng & Units 12/12/2020 06/27/2020 04/04/2020  WBC 3.4 - 10.8 x10E3/uL 5.0 4.4 -  Hemoglobin 11.1 - 15.9 g/dL 13.3 14.3 -  Hematocrit 34.0 - 46.6 % 39.7 41.1 57.8(H)  Platelets 150 - 450 x10E3/uL 227 235 -   Lab Results  Component Value Date   IRON 52 12/12/2020   TIBC 290 12/12/2020   FERRITIN 83 12/12/2020   Lab Results  Component Value Date   VITAMINB12 414 12/12/2020   2. Vitamin D deficiency Last vitamin D slightly low at 44.6.  On 10,000 IU vitamin D OTC daily.  3. Type 2 diabetes mellitus with other specified complication, without long-term current use of insulin (HCC) In remission.  On metformin for appetite.  Last A1c 4.8.  Lab Results  Component Value Date   HGBA1C 4.7 (L) 12/12/2020   HGBA1C 4.8 04/04/2020   HGBA1C 4.6 (L) 10/12/2019   Lab Results  Component Value Date   LDLCALC 48 12/12/2020   CREATININE 0.67 12/12/2020   Lab Results  Component Value Date   INSULIN 1.5 (L) 12/12/2020   INSULIN 4.0 04/04/2020   INSULIN 3.5 10/12/2019    INSULIN 6.9 08/02/2018   INSULIN 7.0 04/27/2018   4. Other hyperlipidemia Last LDL at goal at 77.  HDL and TG within normal limits.  On Crestor 20 mg.  Has history of CVA.  Lab Results  Component Value Date   ALT 11 12/12/2020   AST 24 12/12/2020   ALKPHOS 70 12/12/2020   BILITOT 0.5 12/12/2020   Lab Results  Component Value Date   CHOL 150 12/12/2020   HDL 90 12/12/2020   LDLCALC 48 12/12/2020   TRIG 58 12/12/2020   CHOLHDL 2.7 04/09/2016   5. Hypertension associated with type 2 diabetes mellitus ([place]) Blood pressure elevated today at 147/81.  Mostly well controlled.  BP Readings from Last 3 Encounters:  12/12/20 (!) 147/81  11/14/20 136/74  08/28/20 (!) 143/75   6. B12 deficiency History of B12 deficiency.  Last B12 normal.  Lab Results  Component Value Date   VITAMINB12 414 12/12/2020   7. Folate deficiency Folate low in the past.  On OTC folate.  Assessment/Plan:   1. Iron deficiency anemia Check anemia panel and CBC today.  Continue iron 325 mg twice daily.  - Vitamin B12 - Folate - Iron and TIBC - Ferritin - CBC with Differential/Platelet  2. Vitamin D deficiency Check vitamin D today.  Continue OTC vitamin D 10,000 IU daily.  - VITAMIN D 25 Hydroxy (Vit-D Deficiency, Fractures)  3. Type 2 diabetes mellitus with other specified complication, without long-term current use of insulin (HCC) Check A1c, fasting insulin, and glucose today.  Continue metformin three times daily.  - Comprehensive metabolic panel - Hemoglobin A1c - Insulin, random  4. Other hyperlipidemia Check FLP today. - Lipid Panel With LDL/HDL Ratio  5. Hypertension associated with type 2 diabetes mellitus (HCC) Refill losartan 100 mg daily, as per below.  - Refill losartan (COZAAR) 100 MG tablet; Take 1 tablet (100 mg total) by mouth daily.  Dispense: 90 tablet; Refill: 0  6. B12 deficiency Add OTC vitamin B12 daily.  Check B12 level today.  - Vitamin B12  7. Folate  deficiency Check folate level today.  Continue OTC folate.  - Folate  8. Overweight: BMI 25  [name] [name] is currently in the action stage of change. As such, her goal is to continue with weight loss efforts. She has agreed to the Category 2 Plan.   Exercise goals: As is.  Behavioral modification strategies: decreasing simple carbohydrates.  Work on with maintenance.    [name] [name] has agreed to follow-up with our clinic in 6 weeks, per patient request. [name] [name] was informed we would discuss her lab results at her next visit unless there is a critical issue that needs to be addressed sooner. [name] [name] agreed to keep her next visit at the agreed upon time to discuss these results.  Objective:   Blood pressure (!) 147/81, pulse 66, temperature (!) 97.5 F (36.4 C), height _0  (1.6 m), weight 156 lb (70.8 kg), SpO2 99 %. Body mass index is 27.63 kg/m.  General: Cooperative, alert, well developed, in no acute distress. HEENT: Conjunctivae and lids unremarkable. Cardiovascular: Regular rhythm.  Lungs: Normal work of breathing. Neurologic: No focal deficits.   Lab Results  Component Value Date   CREATININE 0.67 12/12/2020   BUN 11 12/12/2020   NA 133 (L) 12/12/2020   K 4.4 12/12/2020   CL 95 (L) 12/12/2020   CO2 24 12/12/2020   Lab Results  Component Value Date   ALT 11 12/12/2020   AST 24 12/12/2020   ALKPHOS 70 12/12/2020   BILITOT 0.5 12/12/2020   Lab Results  Component Value Date   HGBA1C 4.7 (L) 12/12/2020   HGBA1C 4.8 04/04/2020   HGBA1C 4.6 (L) 10/12/2019   HGBA1C 4.8 05/04/2019   HGBA1C 5.1 11/03/2018   Lab Results  Component Value Date   INSULIN 1.5 (L) 12/12/2020   INSULIN 4.0 04/04/2020   INSULIN 3.5 10/12/2019   INSULIN 6.9 08/02/2018   INSULIN 7.0 04/27/2018   Lab Results  Component Value Date   TSH 2.100 05/04/2019   Lab Results  Component Value Date   CHOL 150 12/12/2020   HDL 90 12/12/2020   LDLCALC 48 12/12/2020   TRIG 58 12/12/2020   CHOLHDL 2.7  04/09/2016   Lab Results  Component Value Date   WBC 5.0 12/12/2020   HGB 13.3 12/12/2020   HCT 39.7 12/12/2020   MCV 90 12/12/2020   PLT 227 12/12/2020   Lab Results  Component Value Date   IRON 52 12/12/2020   TIBC 290 12/12/2020   FERRITIN 83 12/12/2020   Obesity Behavioral Intervention:   Approximately 15 minutes were spent on the discussion below.  ASK: We discussed the diagnosis of obesity with [name] [name] today and [name] [name] agreed to give [place] permission to discuss obesity behavioral modification therapy today.  ASSESS: [name] [name] has the diagnosis of obesity and her BMI today is 27.7. [name] [name] is in the  action stage of change.   ADVISE: [name] [name] was educated on the multiple health risks of obesity as well as the benefit of weight loss to improve her health. She was advised of the need for long term treatment and the importance of lifestyle modifications to improve her current health and to decrease her risk of future health problems.  AGREE: Multiple dietary modification options and treatment options were discussed and [name] [name] agreed to follow the recommendations documented in the above note.  ARRANGE: [name] [name] was educated on the importance of frequent visits to treat obesity as outlined per CMS and USPSTF guidelines and agreed to schedule her next follow up appointment today.  Attestation Statements:   Reviewed by clinician on day of visit: allergies, medications, problem list, medical history, surgical history, family history, social history, and previous encounter notes.  I, Water quality scientist, CMA, am acting as Location manager for [name], [place].  I have reviewed the above documentation for accuracy and completeness, and I agree with the above. -  Georgianne Fick, FNP

## 2020-12-17 ENCOUNTER — Encounter (INDEPENDENT_AMBULATORY_CARE_PROVIDER_SITE_OTHER): Payer: Self-pay | Admitting: Family Medicine

## 2020-12-31 DIAGNOSIS — Z20822 Contact with and (suspected) exposure to covid-19: Secondary | ICD-10-CM | POA: Diagnosis not present

## 2021-01-02 ENCOUNTER — Ambulatory Visit (INDEPENDENT_AMBULATORY_CARE_PROVIDER_SITE_OTHER): Payer: Medicare Other | Admitting: Family Medicine

## 2021-01-02 ENCOUNTER — Ambulatory Visit (INDEPENDENT_AMBULATORY_CARE_PROVIDER_SITE_OTHER): Payer: Medicare Other | Admitting: Podiatry

## 2021-01-02 ENCOUNTER — Other Ambulatory Visit: Payer: Self-pay

## 2021-01-02 ENCOUNTER — Ambulatory Visit: Payer: Medicare Other

## 2021-01-02 ENCOUNTER — Encounter (INDEPENDENT_AMBULATORY_CARE_PROVIDER_SITE_OTHER): Payer: Self-pay | Admitting: Family Medicine

## 2021-01-02 ENCOUNTER — Encounter: Payer: Self-pay | Admitting: Podiatry

## 2021-01-02 VITALS — BP 114/68 | HR 69 | Temp 97.9°F | Ht 63.0 in | Wt 155.0 lb

## 2021-01-02 DIAGNOSIS — I2510 Atherosclerotic heart disease of native coronary artery without angina pectoris: Secondary | ICD-10-CM | POA: Insufficient documentation

## 2021-01-02 DIAGNOSIS — E119 Type 2 diabetes mellitus without complications: Secondary | ICD-10-CM

## 2021-01-02 DIAGNOSIS — H409 Unspecified glaucoma: Secondary | ICD-10-CM | POA: Insufficient documentation

## 2021-01-02 DIAGNOSIS — I10 Essential (primary) hypertension: Secondary | ICD-10-CM

## 2021-01-02 DIAGNOSIS — E1169 Type 2 diabetes mellitus with other specified complication: Secondary | ICD-10-CM | POA: Diagnosis not present

## 2021-01-02 DIAGNOSIS — Z8673 Personal history of transient ischemic attack (TIA), and cerebral infarction without residual deficits: Secondary | ICD-10-CM | POA: Insufficient documentation

## 2021-01-02 DIAGNOSIS — Z6841 Body Mass Index (BMI) 40.0 and over, adult: Secondary | ICD-10-CM

## 2021-01-02 DIAGNOSIS — G4733 Obstructive sleep apnea (adult) (pediatric): Secondary | ICD-10-CM | POA: Insufficient documentation

## 2021-01-02 DIAGNOSIS — Z7901 Long term (current) use of anticoagulants: Secondary | ICD-10-CM | POA: Insufficient documentation

## 2021-01-02 DIAGNOSIS — Z8639 Personal history of other endocrine, nutritional and metabolic disease: Secondary | ICD-10-CM | POA: Insufficient documentation

## 2021-01-02 DIAGNOSIS — D689 Coagulation defect, unspecified: Secondary | ICD-10-CM | POA: Diagnosis not present

## 2021-01-02 DIAGNOSIS — E1159 Type 2 diabetes mellitus with other circulatory complications: Secondary | ICD-10-CM

## 2021-01-02 DIAGNOSIS — M1990 Unspecified osteoarthritis, unspecified site: Secondary | ICD-10-CM | POA: Insufficient documentation

## 2021-01-02 DIAGNOSIS — M79671 Pain in right foot: Secondary | ICD-10-CM

## 2021-01-02 DIAGNOSIS — Q828 Other specified congenital malformations of skin: Secondary | ICD-10-CM | POA: Diagnosis not present

## 2021-01-02 DIAGNOSIS — E2839 Other primary ovarian failure: Secondary | ICD-10-CM | POA: Insufficient documentation

## 2021-01-02 DIAGNOSIS — Z9884 Bariatric surgery status: Secondary | ICD-10-CM | POA: Insufficient documentation

## 2021-01-02 MED ORDER — METFORMIN HCL 500 MG PO TABS: 500.0000 mg | ORAL_TABLET | Freq: Three times a day (TID) | ORAL | 0 refills | Status: DC

## 2021-01-02 MED ORDER — HYDROCHLOROTHIAZIDE 25 MG PO TABS: 25.0000 mg | ORAL_TABLET | Freq: Every day | ORAL | 0 refills | Status: DC

## 2021-01-05 NOTE — Progress Notes (Signed)
Subjective:   Patient ID: [name] [name], female   DOB: 70 y.o.   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

## 2021-01-07 ENCOUNTER — Encounter (INDEPENDENT_AMBULATORY_CARE_PROVIDER_SITE_OTHER): Payer: Self-pay | Admitting: Family Medicine

## 2021-01-07 NOTE — Progress Notes (Signed)
Chief Complaint:   OBESITY Vashon is here to discuss her progress with her obesity treatment plan along with follow-up of her obesity related diagnoses. Kenyanna is on the Category 2 Plan and states she is following her eating plan approximately 70% of the time. 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  Today's visit was #: 105 Starting weight: 258 lbs Starting date: 0/03/2018 Today's weight: 155 lbs Today's date: 01/02/2021 Total lbs lost to date: 103 lbs Total lbs lost since last in-office visit: 1 lb  Interim History: Malashia is at her goal weight of <160 lbs. Her meals are on plan but she tends to overdo snack calories. She does go off plan when she socializes. She is leaving for a cruise to [place] and [place] tomorrow.  Subjective:   1. Essential hypertension [name]'s blood pressure is well-controlled on Hydrochlorothiazide 25 mg and Losartan 100 mg.  BP Readings from Last 3 Encounters:  01/02/21 114/68  12/12/20 (!) 147/81  11/14/20 136/74   2. Type 2 diabetes mellitus with other specified complication, without long-term current use of insulin (HCC) Kalis's last A1C was 5.7. Currently on Metformin 500 mg for Polyphagia.  Lab Results  Component Value Date   HGBA1C 4.7 (L) 12/12/2020   HGBA1C 4.8 04/04/2020   HGBA1C 4.6 (L) 10/12/2019   Lab Results  Component Value Date   LDLCALC 48 12/12/2020   CREATININE 0.67 12/12/2020   Lab Results  Component Value Date   INSULIN 1.5 (L) 12/12/2020   INSULIN 4.0 04/04/2020   INSULIN 3.5 10/12/2019   INSULIN 6.9 08/02/2018   INSULIN 7.0 04/27/2018   Assessment/Plan:   1. Essential hypertension We will refill Hydrochlorothiazide 25 mg for 90 days, 0 refills.  - hydrochlorothiazide (HYDRODIURIL) 25 MG tablet; Take 1 tablet (25 mg total) by mouth daily.  Dispense: 90 tablet; Refill: 0  2. Type 2 diabetes mellitus with other specified complication, without long-term current use  of insulin (HCC) We will refill Metformin 500 mg three times daily for 90 days, 0 refills.   - metFORMIN (GLUCOPHAGE) 500 MG tablet; Take 1 tablet (500 mg total) by mouth with breakfast, with lunch, and with evening meal.  Dispense: 270 tablet; Refill: 0  3. Obesity, current BMI 27.6 Carrianne is currently in the action stage of change. As such, her goal is to continue with weight loss efforts. She has agreed to the Category 2 Plan.   Exercise goals: As is.  Behavioral modification strategies: decreasing simple carbohydrates.  Agnes has agreed to follow-up with our clinic in 3 weeks.   Objective:   Blood pressure 114/68, pulse 69, temperature 97.9 F (36.6 C), height 5' 3" (1.6 m), weight 155 lb (70.3 kg), SpO2 95 %. Body mass index is 27.46 kg/m.  General: Cooperative, alert, well developed, in no acute distress. HEENT: Conjunctivae and lids unremarkable. Cardiovascular: Regular rhythm.  Lungs: Normal work of breathing. Neurologic: No focal deficits.   Lab Results  Component Value Date   CREATININE 0.67 12/12/2020   BUN 11 12/12/2020   NA 133 (L) 12/12/2020   K 4.4 12/12/2020   CL 95 (L) 12/12/2020   CO2 24 12/12/2020   Lab Results  Component Value Date   ALT 11 12/12/2020   AST 24 12/12/2020   ALKPHOS 70 12/12/2020   BILITOT 0.5 12/12/2020   Lab Results  Component Value Date   HGBA1C 4.7 (L) 12/12/2020   HGBA1C 4.8 04/04/2020   HGBA1C 4.6 (L)  10/12/2019   HGBA1C 4.8 05/04/2019   HGBA1C 5.1 11/03/2018   Lab Results  Component Value Date   INSULIN 1.5 (L) 12/12/2020   INSULIN 4.0 04/04/2020   INSULIN 3.5 10/12/2019   INSULIN 6.9 08/02/2018   INSULIN 7.0 04/27/2018   Lab Results  Component Value Date   TSH 2.100 05/04/2019   Lab Results  Component Value Date   CHOL 150 12/12/2020   HDL 90 12/12/2020   LDLCALC 48 12/12/2020   TRIG 58 12/12/2020   CHOLHDL 2.7 04/09/2016   Lab Results  Component Value Date   WBC 5.0 12/12/2020   HGB 13.3 12/12/2020    HCT 39.7 12/12/2020   MCV 90 12/12/2020   PLT 227 12/12/2020   Lab Results  Component Value Date   IRON 52 12/12/2020   TIBC 290 12/12/2020   FERRITIN 83 12/12/2020   Obesity Behavioral Intervention:   Approximately 15 minutes were spent on the discussion below.  ASK: We discussed the diagnosis of obesity with [name] today and Reeva agreed to give [place] permission to discuss obesity behavioral modification therapy today.  ASSESS: Ndia has the diagnosis of obesity and her BMI today is 27.6. Geeta is in the action stage of change.   ADVISE: Ghalia was educated on the multiple health risks of obesity as well as the benefit of weight loss to improve her health. She was advised of the need for long term treatment and the importance of lifestyle modifications to improve her current health and to decrease her risk of future health problems.  AGREE: Multiple dietary modification options and treatment options were discussed and Etheline agreed to follow the recommendations documented in the above note.  ARRANGE: Lucie was educated on the importance of frequent visits to treat obesity as outlined per CMS and USPSTF guidelines and agreed to schedule her next follow up appointment today.  Attestation Statements:   Reviewed by clinician on day of visit: allergies, medications, problem list, medical history, surgical history, family history, social history, and previous encounter notes.  Leodis Binet Friedenbach, CMA, am acting as Location manager for [name], [place].  I have reviewed the above documentation for accuracy and completeness, and I agree with the above. -  Georgianne Fick, FNP

## 2021-01-13 ENCOUNTER — Telehealth: Payer: Self-pay

## 2021-01-13 NOTE — Telephone Encounter (Signed)
Not sure what happened with the other faxes but we do have a clearance form here for right breast biopsy and excise excess skin lateral chest bilateral.  Plastic surgery is asking if from a neurology perspective would it be okay for patient to be off Xarelto 5 days prior to surgery and remain off Xarelto for 5 days after surgery.  I called Kim back and LVM advising the clearance form would be addressed tomorrow when Dr. Jaynee Eagles is back in the office.

## 2021-01-13 NOTE — Telephone Encounter (Signed)
Kim from Dr. Crissie Reese' office left a voicemail asking if we have received a surgical clearance that she has faxed four times. Please call Maudie Mercury back at [phone] to confirm receipt and status of this clearance.

## 2021-01-14 NOTE — Telephone Encounter (Signed)
Surgical clearance form signed by Dr. Jaynee Eagles and faxed back to [facility] plastic surgery. Received a receipt of confirmation.

## 2021-01-21 DIAGNOSIS — G5602 Carpal tunnel syndrome, left upper limb: Secondary | ICD-10-CM | POA: Diagnosis not present

## 2021-01-21 DIAGNOSIS — G5601 Carpal tunnel syndrome, right upper limb: Secondary | ICD-10-CM | POA: Diagnosis not present

## 2021-01-23 ENCOUNTER — Ambulatory Visit (INDEPENDENT_AMBULATORY_CARE_PROVIDER_SITE_OTHER): Payer: Medicare Other | Admitting: Family Medicine

## 2021-01-23 ENCOUNTER — Other Ambulatory Visit: Payer: Self-pay

## 2021-01-23 ENCOUNTER — Encounter (INDEPENDENT_AMBULATORY_CARE_PROVIDER_SITE_OTHER): Payer: Self-pay | Admitting: Family Medicine

## 2021-01-23 VITALS — BP 125/76 | HR 64 | Temp 97.7°F | Ht 63.0 in | Wt 157.0 lb

## 2021-01-23 DIAGNOSIS — Z6841 Body Mass Index (BMI) 40.0 and over, adult: Secondary | ICD-10-CM

## 2021-01-23 DIAGNOSIS — I152 Hypertension secondary to endocrine disorders: Secondary | ICD-10-CM

## 2021-01-23 DIAGNOSIS — E1159 Type 2 diabetes mellitus with other circulatory complications: Secondary | ICD-10-CM | POA: Diagnosis not present

## 2021-01-27 NOTE — Progress Notes (Signed)
Chief Complaint:   OBESITY [name] [name] is here to discuss her progress with her obesity treatment plan along with follow-up of her obesity related diagnoses. [name] [name] is on the Category 2 Plan and states she is following her eating plan approximately 50% of the time. [name] [name] states she is doing cardio and weight training 30 minutes 5-6 times per week.  Today's visit was #: 45 Starting weight: 258 lbs Starting date: 04/27/2018 Today's weight: 157 lbs Today's date: 01/23/2021 Total lbs lost to date: 101 lbs Total lbs lost since last in-office visit: 0  Interim History: [name] [name] is having skin reduction surgery in axillary area 01/28/2021. She recently went on a [place] cruise and is up 2 lbs today. She is back on category 2 now to get the 2 lbs off.  Subjective:   1. Hypertension associated with type 2 diabetes mellitus ([place]) [name] [name] BP is well controlled. She is on losartan and HCTZ.  BP Readings from Last 3 Encounters:  01/23/21 125/76  01/02/21 114/68  12/12/20 (!) 147/81    Assessment/Plan:   1. Hypertension associated with type 2 diabetes mellitus ([place])  Continue all meds.  2. Overweight, current BMI 27.82  [name] [name] is currently in the action stage of change. As such, her goal is to continue with weight loss efforts. She has agreed to the Category 2 Plan.   Exercise goals: As is  Behavioral modification strategies: decreasing simple carbohydrates.  [name] [name] has agreed to follow-up with our clinic in 4 weeks.   Objective:   Blood pressure 125/76, pulse 64, temperature 97.7 F (36.5 C), height 5' 3" (1.6 m), weight 157 lb (71.2 kg), SpO2 100 %. Body mass index is 27.81 kg/m.  General: Cooperative, alert, well developed, in no acute distress. HEENT: Conjunctivae and lids unremarkable. Cardiovascular: Regular rhythm.  Lungs: Normal work of breathing. Neurologic: No focal deficits.   Lab Results  Component Value Date   CREATININE 0.67 12/12/2020   BUN 11 12/12/2020   NA  133 (L) 12/12/2020   K 4.4 12/12/2020   CL 95 (L) 12/12/2020   CO2 24 12/12/2020   Lab Results  Component Value Date   ALT 11 12/12/2020   AST 24 12/12/2020   ALKPHOS 70 12/12/2020   BILITOT 0.5 12/12/2020   Lab Results  Component Value Date   HGBA1C 4.7 (L) 12/12/2020   HGBA1C 4.8 04/04/2020   HGBA1C 4.6 (L) 10/12/2019   HGBA1C 4.8 05/04/2019   HGBA1C 5.1 11/03/2018   Lab Results  Component Value Date   INSULIN 1.5 (L) 12/12/2020   INSULIN 4.0 04/04/2020   INSULIN 3.5 10/12/2019   INSULIN 6.9 08/02/2018   INSULIN 7.0 04/27/2018   Lab Results  Component Value Date   TSH 2.100 05/04/2019   Lab Results  Component Value Date   CHOL 150 12/12/2020   HDL 90 12/12/2020   LDLCALC 48 12/12/2020   TRIG 58 12/12/2020   CHOLHDL 2.7 04/09/2016   Lab Results  Component Value Date   WBC 5.0 12/12/2020   HGB 13.3 12/12/2020   HCT 39.7 12/12/2020   MCV 90 12/12/2020   PLT 227 12/12/2020   Lab Results  Component Value Date   IRON 52 12/12/2020   TIBC 290 12/12/2020   FERRITIN 83 12/12/2020    Obesity Behavioral Intervention:   Approximately 15 minutes were spent on the discussion below.  ASK: We discussed the diagnosis of obesity with [name] [name] today and [name] [name] agreed to give [place] permission to discuss obesity behavioral modification therapy  today.  ASSESS: Diahann has the diagnosis of obesity and her BMI today is 27.9. Charisa is in the action stage of change.   ADVISE: [name] [name] was educated on the multiple health risks of obesity as well as the benefit of weight loss to improve her health. She was advised of the need for long term treatment and the importance of lifestyle modifications to improve her current health and to decrease her risk of future health problems.  AGREE: Multiple dietary modification options and treatment options were discussed and [name] [name] agreed to follow the recommendations documented in the above note.  ARRANGE: [name] [name] was educated on the  importance of frequent visits to treat obesity as outlined per CMS and USPSTF guidelines and agreed to schedule her next follow up appointment today.  Attestation Statements:   Reviewed by clinician on day of visit: allergies, medications, problem list, medical history, surgical history, family history, social history, and previous encounter notes.  Coral Ceo, CMA, am acting as Location manager for [name], [place].  I have reviewed the above documentation for accuracy and completeness, and I agree with the above. - Georgianne Fick, FNP

## 2021-01-28 ENCOUNTER — Encounter (INDEPENDENT_AMBULATORY_CARE_PROVIDER_SITE_OTHER): Payer: Self-pay | Admitting: Family Medicine

## 2021-01-28 ENCOUNTER — Other Ambulatory Visit: Payer: Self-pay | Admitting: Plastic Surgery

## 2021-01-28 DIAGNOSIS — N6310 Unspecified lump in the right breast, unspecified quadrant: Secondary | ICD-10-CM | POA: Diagnosis not present

## 2021-01-28 DIAGNOSIS — Z411 Encounter for cosmetic surgery: Secondary | ICD-10-CM | POA: Diagnosis not present

## 2021-01-28 DIAGNOSIS — N641 Fat necrosis of breast: Secondary | ICD-10-CM | POA: Diagnosis not present

## 2021-02-03 ENCOUNTER — Encounter (INDEPENDENT_AMBULATORY_CARE_PROVIDER_SITE_OTHER): Payer: Self-pay

## 2021-02-05 ENCOUNTER — Encounter: Payer: Self-pay | Admitting: Neurology

## 2021-02-05 ENCOUNTER — Ambulatory Visit (INDEPENDENT_AMBULATORY_CARE_PROVIDER_SITE_OTHER): Payer: Medicare Other | Admitting: Neurology

## 2021-02-05 VITALS — BP 144/82 | HR 76 | Ht 63.0 in | Wt 179.0 lb

## 2021-02-05 DIAGNOSIS — E1142 Type 2 diabetes mellitus with diabetic polyneuropathy: Secondary | ICD-10-CM

## 2021-02-05 DIAGNOSIS — M4807 Spinal stenosis, lumbosacral region: Secondary | ICD-10-CM | POA: Diagnosis not present

## 2021-02-05 DIAGNOSIS — R2689 Other abnormalities of gait and mobility: Secondary | ICD-10-CM

## 2021-02-05 DIAGNOSIS — R200 Anesthesia of skin: Secondary | ICD-10-CM

## 2021-02-05 DIAGNOSIS — M48061 Spinal stenosis, lumbar region without neurogenic claudication: Secondary | ICD-10-CM

## 2021-02-05 DIAGNOSIS — R269 Unspecified abnormalities of gait and mobility: Secondary | ICD-10-CM | POA: Diagnosis not present

## 2021-02-05 MED ORDER — QUTENZA (4 PATCH) 8 % EX KIT: 4.0000 | PACK | Freq: Once | CUTANEOUS | 3 refills | Status: AC

## 2021-02-05 NOTE — Progress Notes (Addendum)
[name]    Provider:  Dr Jaynee Eagles Referring Provider: [name], MD Primary Care Physician:  [name], MD   CC: Stroke, Cavernous sinus mass s/p surgery, dizziness, peripheral neuropathy  Interval history 02/05/2021: She has peripheral neuropathy. Diabetes and B12 thought to contribute to her neuropathy ongoing for 20+ years. Hgba1c now better with weight loss4.7, B12 414 and has been in a good range the last year, we did genetic testing, we checked b1, b6, b12, tsh, ana/reflex, IFE/Spep, heavy metals, RF celiac Abs, hep c, rpr, sjogren's, sed rate, it is more numb, she has a burning electrical pain on the ventral medial surface, genetic panel invitae was negative 80 genes plus amyloid. 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 MRI of the lumbar spine in the past showed some mild stenosis and multilevel spondylosis,, she is having lower back pain, may repeat the MRI of the lumbar spine for worsening spinal stenosis.   Patient complains of symptoms per HPI as well as the following symptoms:intentional weight loss . Pertinent negatives and positives per HPI. All others negative    Interval history 07/09/2019: Can come off of xarelto for surgery. 3 years without afib. If we are asked happy to write her a note. She had a massage Wednesday and Thursday she had an epicode of vertigo, she turned over in bed and had a second episode. She felt dizzy for a few days and then again had imbalance and fell with dizziness. She had vestibular therapy and was better. Happy to give her valium as needed. She recently had epley maneuver teaching again and feels better.   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  In the past we discussed smoking which can cause cerebrovascular  atherosclerosis.  We recommended the healthy weight and wellness center.  No signs or symptoms of sleep apnea, and had a negative sleep eval in the past.  She also had a cavernous sinus mass status post removal. She has had a loop recorder placed.  She is on Gabapentin for neuropathy, Xarelto for stroke prevention. Takes Vitamin B supplements. She had bariatric surgery, resolved OSA. Loop hasn;t shown any afib or aflutter. Continues the Xarelto. She goes yearly for MRi to evaluate for previous sinus mass. Gabapentin helps. She continues to take B12 daily. HgbA1c 6.2 up a bit. She follows for cholesterol. She is taking Calcium and vitamin D daily now since showing Osteopenia. Weight stable.   Interval history 01/18/2017:  Patient is here for follow up. 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 Feet are burning a little more. 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  Recommend the Healthy Weight and [place] to address her obesity and multiple joint pain. She does not snore and is not tired during the day, had a borderline sleep evaluation in the past will monitor for OSA, The burning in her feet is worsening.  More noticeable at night. She is on Lipoic acid daily.   Interval history 07/20/2016: She has had neuropathy for years.She has had neuropathy for over 15 years or longer. It started in the feet. Numbness, burning in the feet, they feel cold to the touvh but she feels them burning, she has pain in the feet. She tried Lyrica possible in  a neuropathy drug trial and she believe she had side effects. Worse at night in bed. Cramping in the feet.   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 She does not know how long she had B12 deficiency. Her last B12 was normal 843. She takes oral supplements. Also HgbA1c 6.0. TSH 2.070. She also has a history of diabetes which improved after bariatric surgery  but still elevated 6.0. HgbA1c was as high as 7.7 in the past.   Interval history 06/09/2016: Since being seen patient has had another stroke. She was admitted 04/10/2016. She had newly identified clot by LE by doppler but TCD bubble study was negative. She was discharged on Xarelto. Loop recorder was placed. She presented to the ED with slurred speech. Most symptoms have resolved. She cannot walk very well, her legs and feet are very numb. She has numbness in the feet and legs. She has imbalance. And she has had a history of B12 deficiency. She still has the mouth numbness and right hand sensory changes from previous stroke but the aphasia is resolved.   Ct Head Wo Contrast 04/08/2016 1. 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 2. New focal low-density in the left posterior frontal periventricular white matter, potentially a subacute small vessel infarct. Extensive chronic small vessel ischemic changes in the periventricular white matter are otherwise stable. No evidence of acute cortical stroke or hemorrhage.   Mr Brain Wo Contrast 04/08/2016 1. Confirmation of acute nonhemorrhagic white matter infarct involving the left corona radiata and posterior centrum semi of ally. 2. Otherwise stable severe periventricular and diffuse subcortical T2 hyperintensities, advanced for age. This represents the sequela of chronic microvascular ischemia. 3. 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   Ct Angio Head & Neck W Or Wo Contrast 04/09/2016 1. Positive for arterial abnormalities associated with the acute white matter ischemia: Severe stenosis at the origin of a left MCA middle sylvian division M2 branch. Moderate irregularity and stenosis in anterior division left M2/ M3 branches. No left MCA M1 stenosis or occlusion. 2. Otherwise carotid bifurcation  and ICA siphon atherosclerosis without hemodynamically significant stenosis. 3. Negative posterior circulation. 4. Stable CT appearance of the brain since yesterday. No associated hemorrhage or mass effect. 5. Abnormal CT appearance of the sella turcica and left cavernous sinus corresponding to the chronic invasive pituitary adenoma. 6. No acute findings in the neck.   LE venous dopplers Right lower extremity is negativefor deep vein thrombosis. The left lower extremity is positivefor deep vein thrombosis involving a small segment of the left mid femoral vein.There is no evidence of Baker's cyst bilaterally  TEE Normal TEE. No cardioembolic source.  TCD bubble study - negative for PFO  FFM:[id number] A Jarrardis a 70 y.o.femalehere as a follow-up from the hospital after a stroke. She still has numbness in the right fingers and moth due to the left thalamic stroke. We discussed her strokes, she is due for Cardiac Monitoring and then possibly Loop. Will discuss with our Vascular doctors. Also discussed at length the invasive pituitary tumor shown, I reviewed imaging with patient and pointed out findingd, discussed next steps including MRI w/wo with pituitary protocol as well as referral to [place].She was not on an aspirin when this happened.   Reviewed notes, labs and imaging from outside physicians, which showed:  IMPRESSION: 1. Acute lacunar infarct in  the left thalamus. 2. Possible subacute lacunar infarct in the right splenium of the corpus callosum. 3. Moderate chronic small vessel ischemic disease. 4. Mass involving the sella, left cavernous sinus, and left sphenoid sinus, suspicious for invasive pituitary macroadenoma. Further evaluation with nonemergent pituitary protocol MRI (without and with contrast) is recommended. NEUROSURGERY REVIEWED AND FEELS THIS IS A MASS OF THE SPHENOID SINUS THAT HAS INVADED THE CAVERNOUS SINUS,NOT PITUITARY TUMOR 5. No major intracranial arterial occlusion  or significant proximal posterior circulation stenosis. 6. Severe proximal left M2 MCA stenoses.  Review of Systems: Patient complains of symptoms per HPI as well as the following symptoms: No CP, no SOB, no fever, no chills. Pertinent negatives per HPI. All others negative.   Social History   Socioeconomic History  . Marital status: Single    Spouse name: Not on file  . Number of children: 0  . Years of education: college  . Highest education level: Not on file  Occupational History  . Occupation: Retired  Tobacco Use  . Smoking status: Former Smoker    Packs/day: 1.00    Years: 40.00    Pack years: 40.00    Types: Cigarettes    Quit date: 08/2006    Years since quitting: 14.4  . Smokeless tobacco: Never Used  Vaping Use  . Vaping Use: Never used  Substance and Sexual Activity  . Alcohol use: Yes    Alcohol/week: 2.0 standard drinks    Types: 2 Glasses of wine per week    Comment: 07/06/2019 maybe 2 glasses of wine/week; 04/08/2016 "5-6 drinks/year'  . Drug use: No    Types: Marijuana    Comment: 04/08/2016 "did some marijuana in college"  . Sexual activity: Never    Birth control/protection: Surgical  Other Topics Concern  . Not on file  Social History Narrative   Lives at home alone   Right-handed   Drinks 4 cups of coffee a day    Social Determinants of Health   Financial Resource Strain: Not on file  Food Insecurity: Not on file  Transportation Needs: Not on file  Physical Activity: Not on file  Stress: Not on file  Social Connections: Not on file  Intimate Partner Violence: Not on file    Family History  Problem Relation Age of Onset  . Neuropathy Mother   . Hypertension Mother   . Hyperlipidemia Mother   . Thyroid disease Mother   . Prostate cancer Father   . Lymphoma Father   . Hypertension Father   . Hyperlipidemia Father   . Cancer Father   . Obesity Father   . Congestive Heart Failure Maternal Grandmother   . Lung cancer Maternal  Grandfather     Past Medical History:  Diagnosis Date  . Anemia   . Arthritis    "knees, hips, possibly back" (04/08/2016)  . Atrial septal aneurysm   . B12 deficiency    a. following gastric bypass.  . Back pain   . Chronic lower back pain    spondylosis  . Cold feet   . Daily headache    "short ones for the last month or 2" (04/08/2016)  . Depression   . Diabetes ([place])   . Dry mouth   . DVT (deep venous thrombosis) ([place])   . Easy bruising   . Floaters in visual field   . GERD (gastroesophageal reflux disease)    takes Protonix daily "just to protect my stomach; not for reflux" (04/08/2016)  . Hay fever   .  Heart murmur    "dx'd by 1 anesthesiologist years and years ago"  . History of loop recorder 03/2016  . Hyperlipidemia   . Hypertension   . Joint pain   . Leg cramp   . Macular degeneration    "just watching"  . Neuropathy   . Nosebleed   . Palpitations   . Peripheral edema   . Peripheral neuropathy    not on any meds  . Pituitary mass ([place])    a. s/p endoscopic surgery 02/2016.  [name] Pneumonia 12/2017  . Sleep apnea    no CPAP since weight loss after bariatric surgery '12 (04/08/2016)  . Spondylosis   . Stroke ([place]) 04/07/2016   a. 01/2016 and 03/2016. during admission had + LE DVT.  [name] Swelling of extremity   . TIA (transient ischemic attack)   . Trouble in sleeping   . Urinary incontinence   . Weakness     Past Surgical History:  Procedure Laterality Date  . ABDOMINOPLASTY    . BRACHIOPLASTY     and more tissue removed during a second surgery  . CATARACT EXTRACTION W/ INTRAOCULAR LENS  IMPLANT, BILATERAL Bilateral ~ 2012  . COLONOSCOPY    . EP IMPLANTABLE DEVICE N/A 04/10/2016   Procedure: Loop Recorder Insertion;  Surgeon: Will Meredith Leeds, MD;  Location: [facility];  Service: Cardiovascular;  Laterality: N/A;  . ESOPHAGOGASTRODUODENOSCOPY     in the 70's  . FACIAL COSMETIC SURGERY    . JOINT REPLACEMENT    . MASTOPEXY    . ROUX-EN-Y  GASTRIC BYPASS  11/24/10  . SINUS ENDO W/FUSION  02/28/2016   Procedure: ENDOSCOPIC SINUS SURGERY WITH NAVIGATION;  Surgeon: Ruby Cola, MD;  Location: [facility];  Service: ENT;;  . TEE WITHOUT CARDIOVERSION N/A 04/10/2016   Procedure: TRANSESOPHAGEAL ECHOCARDIOGRAM (TEE) POSSIBLE LOOP;  Surgeon: Sanda Klein, MD;  Location: [facility];  Service: Cardiovascular;  Laterality: N/A;  . TONSILLECTOMY  1957  . TOTAL HIP ARTHROPLASTY Right 2008  . TOTAL HIP REVISION Right 03/30/2016   Procedure: RIGHT TOTAL HIP REVISION;  Surgeon: Frederik Pear, MD;  Location: [facility];  Service: Orthopedics;  Laterality: Right;  . TOTAL KNEE ARTHROPLASTY Bilateral 2001  . VAGINAL HYSTERECTOMY     "left my ovaries"    Current Outpatient Medications  Medication Sig Dispense Refill  . Capsaicin-Cleansing Gel (QUTENZA, 4 PATCH,) 8 % KIT Apply 4 patches topically once for 1 dose. 1 kit 3  . Cholecalciferol (VITAMIN D3) 250 MCG (10000 UT) capsule Take 1 capsule (10,000 Units total) by mouth daily. 30 capsule 0  . diazepam (VALIUM) 2 MG tablet TAKE 1 TABLET BY MOUTH TWICE A DAY AS NEEDED FOR VERTIGO    . ferrous sulfate 325 (65 FE) MG tablet Take 325 mg by mouth 2 (two) times daily with a meal.    . folic acid (FOLVITE) 440 MCG tablet Take 400 mcg by mouth daily.    [name] gabapentin (NEURONTIN) 300 MG capsule Take 2 capsules (600 mg total) by mouth at bedtime. 180 capsule 4  . hydrochlorothiazide (HYDRODIURIL) 25 MG tablet Take 1 tablet (25 mg total) by mouth daily. 90 tablet 0  . losartan (COZAAR) 100 MG tablet Take 1 tablet (100 mg total) by mouth daily. 90 tablet 0  . magnesium oxide (MAG-OX) 400 MG tablet Take 400 mg by mouth at bedtime.     . metFORMIN (GLUCOPHAGE) 500 MG tablet Take 1 tablet (500 mg total) by mouth with breakfast, with lunch, and with  evening meal. 270 tablet 0  . Multiple Vitamins-Minerals (OCUVITE-LUTEIN PO) Take 1 tablet by mouth daily.    Alveda Reasons 20 MG TABS tablet Take 20 mg by mouth daily.     No  current facility-administered medications for this visit.    Allergies as of 02/05/2021 - Review Complete 02/05/2021  Allergen Reaction Noted  . Ambien [zolpidem] Other (See Comments) 03/27/2016  . Atorvastatin Other (See Comments) 02/24/2016  . Codeine Nausea And Vomiting 05/29/2011  . Penicillins Itching and Other (See Comments) 05/29/2011  . Simvastatin Other (See Comments) 03/27/2016  . Meclizine  07/06/2019  . Meclizine hcl  11/18/2020  . Other Other (See Comments) 11/18/2020  . Byetta 10 mcg pen [exenatide] Nausea Only 03/27/2016    Vitals: BP (!) 144/82 (BP Location: Right Arm, Patient Position: Sitting)   Pulse 76   Ht _0  (1.6 m)   Wt 179 lb (81.2 kg)   BMI 31.71 kg/m  Last Weight:  Wt Readings from Last 1 Encounters:  02/05/21 179 lb (81.2 kg)   Last Height:   Ht Readings from Last 1 Encounters:  02/05/21 _1  (1.6 m)   Exam: NAD, pleasant                  Speech:    Speech is normal; fluent and spontaneous with normal comprehension.  Cognition:    The patient is oriented to person, place, and time;     recent and remote memory intact;     language fluent;    Cranial Nerves:    The pupils are equal, round, and reactive to light.Trigeminal sensation is intact and the muscles of mastication are normal. The face is symmetric. The palate elevates in the midline. Hearing intact. Voice is normal. Shoulder shrug is normal. The tongue has normal motion without fasciculations.   Coordination:  No dysmetria  Motor Observation:    No asymmetry, no atrophy, and no involuntary movements noted. Tone:    Normal muscle tone.     Strength:    Strength is V/V in the upper and lower limbs.      Sensation: decreased pin prick and temp to the knees, absent vibration to the ankles, intact proprioception. Numbness right knee.   Absent AJs.      Assessment and Plan:Ms.[name] Devincentis Jarrardis a 70 y.o.femalewith history of HTN, HLD,TIA/stroke, diet controlled DM type  2, peripheral neuropathy,s/p gastric bypass. . L corona radiata/posterior centrum semiovale infarct, etiology not clear. She had left thalamic and right splenium punctate infarcts in 01/2016, suspicious for cardioembolic infarcts. Pt does have severe L M2 inferior division stenosis and recent hypotension, but the distribution of M2 inferior does not fit into these infarcts. TEE normal and loop placed. Found to have DVT likely due to recent hip surgery but TCD bubble study no PFO.   Recommend [place] Restorer for her neuropathy: "Consider the [facility] RestorerT stimulator as a potential alternative to traditional treatment to neuropathic pain. The [place] RestorerT is a small electrical stimulator that provides impulses through silver conductive garments that can increase blood flow and oxygenation in patients with neuropathy. Patients can wear the comfortable, stretchable silver conductive garments (sock, glove) on their extremities, which is far more effective than typical small 2" or 3" round self-adhesive electrodes and difficult to administer. Patients have the option to administer Program 1, which is a sixty minute treatment performed 2-3 times daily, or Program 2, which is a nighttime treatment which helps for patients with burning, prickling pain that sometimes  occurs at night."  - She has peripheral neuropathy. Diabetes and B12 thought to contribute to her neuropathy ongoing for 20+ years. Hgba1c now 4.7, B12 414 and has been in a good range the last year, we did genetic testing, we checked b1, b6, b12, tsh, ana/reflex, IFE/Spep, heavy metals, RF celiac Abs, hep c, rpr, sjogren's, sed rate, it is more numb, she has a burning electrical pain on the ventral medial surface, genetic panel invitae was negative 80 genes plus amyloid. 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 MRI of the lumbar spine in the past showed some mild stenosis and multilevel  spondylosis,, she is having lower back pain, may repeat the MRI of the lumbar spine for worsening spinal stenosis.  - Tried gabapentin, been to a chiropractor and paid out of pocket for treatments $6000, lyrica, tylenol, topical creams/lidocaine/compounded cream), elavil, etodolac, alpha lipoic acid, tramadol.   - numbness in the hands, been to the hand center, likely CTS.  - Can come off of xarelto for surgery. 3+ years without afib. If we are asked happy to write her a note.   - Vertigo. She had vestibular therapy and was better. Happy to give her valium as needed. She recently had epley maneuver teaching again and feels better.   - Genetic testing for polyneuroathy, worsening, extensive testing unremarkable  - continue Xarelto for stroke prevention, for life  PRIOR  - Emg/ncs of the left arm and left leg showed length-dependent axonal predominantly sensory neuropathy. Risk factors include b12 deficiency and diabetes. Will test for other causes of neuropathy. neurontin at night.Alpha-lipoic acid daily. Discussed side effects as per patient instructions.  - Loop recorder placed 7/21 no afib or aflutter for close to 2 years - TCD bubble negative for PF - LDL, HgbA1c controlled - BP goal 130-150 due to left M2 severe stenosis - Continue statin  - HgbA1c 6.2, goal < 7.0 - Hx OSA, no CPAP since bariatric surgery in 2012, she denies any signs or symptoms no fatigue, not nodding off, nor morning headaches, no excessive fatigue, no known snoring. (she lost weight 336 was her max weight now 255) - Recommend the healthy weight and wellness center: sent referral  Orders Placed This Encounter  Procedures  . MR LUMBAR SPINE WO CONTRAST   - continue Xarelto - Continue neurontin for neuropathy  Cc; Dr. Tawny Asal, MD  [facility] [address] [place], [place] [address]  Phone [phone] Fax [phone] A total of over 30 minutes was  spent face-to-face with this patient. Over half this time was spent on counseling patient on the  1. Diabetic polyneuropathy associated with type 2 diabetes mellitus ([place])   2. Spinal stenosis of lumbosacral region   3. Spinal stenosis of lumbar region without neurogenic claudication   4. Leg numbness   5. Imbalance   6. Gait abnormality    diagnosis and different diagnostic and therapeutic options, counseling and coordination of care, risks ans benefits of management, compliance, or risk factor reduction and education.

## 2021-02-05 NOTE — Patient Instructions (Signed)
Capsaicin topical patch What is this medicine? CAPSAICIN (cap SAY sin) is a pain reliever. It is used to treat postherpetic neuralgia (PHN) or diabetic neuropathy pain. This medicine may be used for other purposes; ask your health care provider or pharmacist if you have questions. COMMON BRAND NAME(S): Qutenza What should I tell my health care provider before I take this medicine? They need to know if you have any of these conditions:  broken or irritated skin  high blood pressure  history of heart attack or stroke  an unusual or allergic reaction to capsaicin, hot peppers, other medicines, foods, dyes, or preservatives  pregnant or trying to get pregnant  breast-feeding How should I use this medicine? This medicine is for external use only. It is applied by a health care professional in a hospital or clinic setting. Talk to your pediatrician regarding the use of this medicine in children. Special care may be needed. Overdosage: If you think you have taken too much of this medicine contact a poison control center or emergency room at once. NOTE: This medicine is only for you. Do not share this medicine with others. What if I miss a dose? This does not apply. What may interact with this medicine? Interactions are not expected. Do not use any other skin products on the affected area without asking your doctor or health care professional. This list may not describe all possible interactions. Give your health care provider a list of all the medicines, herbs, non-prescription drugs, or dietary supplements you use. Also tell them if you smoke, drink alcohol, or use illegal drugs. Some items may interact with your medicine. What should I watch for while using this medicine? Your condition will be monitored carefully while you are receiving this medicine. Your blood pressure may go up during the procedure. Do not touch the drug patch during treatment. This medicine causes red, burning skin.  You may need pain medicine for during and after the procedure. This medicine can make you more sensitive to heat for a few days after treatment. Be careful in hot showers or baths. Keep out of the sun. Exercise may make the treated skin feel hotter. Tell your doctor or healthcare professional if your symptoms do not start to get better or if they get worse. What side effects may I notice from receiving this medicine? Side effects that you should report to your doctor or health care professional as soon as possible:  allergic reactions like skin rash, itching or hives, swelling of the face, lips, or tongue  burning pain, redness that does not go away  changes in blood pressure  cough or trouble breathing  eye irritation  skin sores or thinning Side effects that usually do not require medical attention (report to your doctor or health care professional if they continue or are bothersome):  bruising  dry skin  nausea  unusual body smell This list may not describe all possible side effects. Call your doctor for medical advice about side effects. You may report side effects to FDA at 1-800-FDA-1088. Where should I keep my medicine? This drug is given in a hospital or clinic and will not be stored at home. NOTE: This sheet is a summary. It may not cover all possible information. If you have questions about this medicine, talk to your doctor, pharmacist, or health care provider.  2021 Elsevier/Gold Standard (2019-04-20 01:06:16)

## 2021-02-06 ENCOUNTER — Telehealth: Payer: Self-pay | Admitting: *Deleted

## 2021-02-06 ENCOUNTER — Telehealth: Payer: Self-pay | Admitting: Neurology

## 2021-02-06 NOTE — Telephone Encounter (Signed)
MRI Lumbar Spine wo contrast sent to [facility]. They will call patient to schedule. [employer] Medicare A & B/[employer].

## 2021-02-06 NOTE — Telephone Encounter (Signed)
Submitted Enrollment for Qutenza. Awating benefit investigation for coverage.

## 2021-02-11 NOTE — Addendum Note (Signed)
Addended by: Sarina Ill B on: 02/11/2021 01:34 PM   Modules accepted: Orders

## 2021-02-11 NOTE — Telephone Encounter (Signed)
Received call needed medicare placed on portal.  Done.  Also faxed to them [phone] notes, insurance for assist [place].

## 2021-02-11 NOTE — Telephone Encounter (Signed)
Prescription for [facility] restorer in addition to office note and demographics faxed to [facility]. Received a receipt of confirmation.

## 2021-02-16 ENCOUNTER — Ambulatory Visit
Admission: RE | Admit: 2021-02-16 | Discharge: 2021-02-16 | Disposition: A | Discharge status: Home / Self Care | Payer: Medicare Other | Source: Ambulatory Visit | Attending: Neurology | Admitting: Neurology

## 2021-02-16 ENCOUNTER — Other Ambulatory Visit: Payer: Self-pay

## 2021-02-16 DIAGNOSIS — M48061 Spinal stenosis, lumbar region without neurogenic claudication: Secondary | ICD-10-CM | POA: Diagnosis not present

## 2021-02-16 DIAGNOSIS — R200 Anesthesia of skin: Secondary | ICD-10-CM

## 2021-02-16 DIAGNOSIS — R2689 Other abnormalities of gait and mobility: Secondary | ICD-10-CM

## 2021-02-16 DIAGNOSIS — R269 Unspecified abnormalities of gait and mobility: Secondary | ICD-10-CM

## 2021-02-16 DIAGNOSIS — M4807 Spinal stenosis, lumbosacral region: Secondary | ICD-10-CM

## 2021-02-17 ENCOUNTER — Telehealth: Payer: Self-pay | Admitting: Neurology

## 2021-02-17 ENCOUNTER — Other Ambulatory Visit: Payer: Self-pay | Admitting: Neurology

## 2021-02-17 DIAGNOSIS — C649 Malignant neoplasm of unspecified kidney, except renal pelvis: Secondary | ICD-10-CM

## 2021-02-17 DIAGNOSIS — N2889 Other specified disorders of kidney and ureter: Secondary | ICD-10-CM

## 2021-02-17 NOTE — Telephone Encounter (Signed)
[name] [name] please call patient and forward results to her primary care(please verify it is Dr. Radene Ou), Her MRi of the lumbar spine showed arthritic/degenerative changes which can cause low back pain but nothing serious/significant. However I am concerned about the mass that was seen in the kidneys. I can order the CT of the abdomen and pelvis if she is ok with that, and she has to follow up with Dr. Radene Ou. Based on the CT Scan results we may refer her elsewhere as well.   IMPRESSION: 1. Incompletely visualized 7.5 cm heterogeneous right renal mass concerning for renal cell carcinoma. Adjacent retrocaval lymphadenopathy measuring up to 13 mm. Recommend further evaluation with a CT of the abdomen and pelvis with intravenous contrast. 2. Diffuse lumbar spine spondylosis as described above.

## 2021-02-17 NOTE — Telephone Encounter (Signed)
I have already emailed with patient, placed CT Scan orders and referral to [facility], no need to call fyi

## 2021-02-18 ENCOUNTER — Other Ambulatory Visit: Payer: Self-pay | Admitting: Neurology

## 2021-02-18 ENCOUNTER — Telehealth: Payer: Self-pay | Admitting: *Deleted

## 2021-02-18 DIAGNOSIS — N2889 Other specified disorders of kidney and ureter: Secondary | ICD-10-CM

## 2021-02-18 NOTE — Telephone Encounter (Signed)
Received call from [facility] bringing attention to MRI lumbar spine results. I advised Dr Jaynee Eagles has seen them and reached out to patient and also ordered further imaging. She verbalized understanding and appreciation.

## 2021-02-19 ENCOUNTER — Telehealth: Payer: Self-pay

## 2021-02-19 NOTE — Telephone Encounter (Signed)
Referral sent to [facility]

## 2021-02-20 ENCOUNTER — Ambulatory Visit (INDEPENDENT_AMBULATORY_CARE_PROVIDER_SITE_OTHER): Payer: Medicare Other | Admitting: Family Medicine

## 2021-02-20 ENCOUNTER — Telehealth: Payer: Self-pay | Admitting: *Deleted

## 2021-02-20 DIAGNOSIS — E871 Hypo-osmolality and hyponatremia: Secondary | ICD-10-CM | POA: Diagnosis not present

## 2021-02-20 NOTE — Telephone Encounter (Signed)
Submitted to [place] 02-11-21.

## 2021-02-24 ENCOUNTER — Other Ambulatory Visit: Payer: Self-pay | Admitting: Urology

## 2021-02-24 DIAGNOSIS — D4101 Neoplasm of uncertain behavior of right kidney: Secondary | ICD-10-CM

## 2021-02-24 DIAGNOSIS — R590 Localized enlarged lymph nodes: Secondary | ICD-10-CM | POA: Diagnosis not present

## 2021-02-24 NOTE — Telephone Encounter (Signed)
I called [name] [name] back at the number provided and LVM on her confidential VM asking for call back to discuss the scan for the patient.

## 2021-02-24 NOTE — Telephone Encounter (Signed)
[facility]ist Sharee Pimple) called, Dr. Gloriann Loan want to see if there can be a variation in the scan that was ordered. Can you order something a little different so she does not have to do bunches of different scans. Would like a call from the nurse.   Contact info: [phone] Ext [phone]

## 2021-02-24 NOTE — Telephone Encounter (Signed)
Sharee Pimple returned my call.  She stated that Dr. Gloriann Loan saw the patient today and he would like to order CT abdomen pelvis with and without contrast as well as a CT chest for rule out purposes.  They understand that Dr Jaynee Eagles had previously ordered the CT abdomen pelvis to further assess the renal mass.  Sharee Pimple will contact [facility] to see if they can complete all of the testing and avoid having to do repeat testing.  I let her know I would send an update to Dr. Jaynee Eagles.  She also stated she would call us back if we need to do anything different.

## 2021-02-26 ENCOUNTER — Ambulatory Visit
Admission: RE | Admit: 2021-02-26 | Discharge: 2021-02-26 | Disposition: A | Discharge status: Home / Self Care | Payer: Medicare Other | Source: Ambulatory Visit | Attending: Urology | Admitting: Urology

## 2021-02-26 ENCOUNTER — Inpatient Hospital Stay: Admission: RE | Admit: 2021-02-26 | Payer: Federal, State, Local not specified - PPO | Source: Ambulatory Visit

## 2021-02-26 DIAGNOSIS — N2889 Other specified disorders of kidney and ureter: Secondary | ICD-10-CM | POA: Diagnosis not present

## 2021-02-26 DIAGNOSIS — D4101 Neoplasm of uncertain behavior of right kidney: Secondary | ICD-10-CM

## 2021-02-26 DIAGNOSIS — K8020 Calculus of gallbladder without cholecystitis without obstruction: Secondary | ICD-10-CM | POA: Diagnosis not present

## 2021-02-26 DIAGNOSIS — R918 Other nonspecific abnormal finding of lung field: Secondary | ICD-10-CM | POA: Diagnosis not present

## 2021-02-26 MED ORDER — IOPAMIDOL (ISOVUE-300) INJECTION 61%
100.0000 mL | Freq: Once | INTRAVENOUS | Status: AC | PRN
  Administered 2021-02-26: 100 mL via INTRAVENOUS

## 2021-03-03 ENCOUNTER — Telehealth: Payer: Self-pay | Admitting: Neurology

## 2021-03-03 NOTE — Telephone Encounter (Signed)
Pt called, Medicare ask that schedule 30-day for F/u PDC restore. Would like a call from the nurse to discuss a sooner appt than 06/25/21.

## 2021-03-03 NOTE — Telephone Encounter (Signed)
Spoke with patient and scheduled her for Monday July 18th at 8:30 AM.  I also canceled the October appt w/ Dr Jaynee Eagles since pt is being seen sooner. Pt was very appreciative.

## 2021-03-04 NOTE — Telephone Encounter (Signed)
Called and LMVM f/u qutenza next steps.  [phone].

## 2021-03-06 ENCOUNTER — Ambulatory Visit (INDEPENDENT_AMBULATORY_CARE_PROVIDER_SITE_OTHER): Payer: Medicare Other | Admitting: Family Medicine

## 2021-03-11 ENCOUNTER — Encounter (INDEPENDENT_AMBULATORY_CARE_PROVIDER_SITE_OTHER): Payer: Self-pay | Admitting: Family Medicine

## 2021-03-11 ENCOUNTER — Other Ambulatory Visit: Payer: Self-pay

## 2021-03-11 ENCOUNTER — Ambulatory Visit (INDEPENDENT_AMBULATORY_CARE_PROVIDER_SITE_OTHER): Payer: Medicare Other | Admitting: Family Medicine

## 2021-03-11 VITALS — BP 134/76 | HR 74 | Temp 97.6°F | Ht 63.0 in | Wt 160.0 lb

## 2021-03-11 DIAGNOSIS — Z6841 Body Mass Index (BMI) 40.0 and over, adult: Secondary | ICD-10-CM

## 2021-03-11 DIAGNOSIS — E1159 Type 2 diabetes mellitus with other circulatory complications: Secondary | ICD-10-CM

## 2021-03-11 DIAGNOSIS — I152 Hypertension secondary to endocrine disorders: Secondary | ICD-10-CM

## 2021-03-11 NOTE — Progress Notes (Signed)
Chief Complaint:   OBESITY [name] [name] is here to discuss her progress with her obesity treatment plan along with follow-up of her obesity related diagnoses. [name] [name] is on the Category 2 Plan and states she is following her eating plan approximately 25% of the time. See states she is on the treadmill for 40 minutes 3 times per week.  Today's visit was #: 62 Starting weight: 258 lbs Starting date: 04/27/2018 Today's weight: 160 lbs Today's date: 03/11/2021 Total lbs lost to date: 2 Total lbs lost since last in-office visit: 0  Interim History: [name] [name] recently found out that she likely has kidney cancer. She had an MRI to assess etiology of peripheral neuropathy and a mass was seen. She has a follow up scheduled with Urology cancer specialist. She is doing fairly well dealing with this.  Subjective:   1. Hypertension associated with type 2 diabetes mellitus ([place]) [name]'s blood pressure is well controlled. She is on losartan and hydrochlorothiazide. She has had recent low sodium levels.  BP Readings from Last 3 Encounters:  03/11/21 134/76  02/05/21 (!) 144/82  01/23/21 125/76   Lab Results  Component Value Date   CREATININE 0.67 12/12/2020   CREATININE 0.73 06/27/2020   CREATININE 0.76 04/04/2020   Assessment/Plan:   1. Hypertension associated with type 2 diabetes mellitus ([place]) [name] will continue losartan and hydrochlorothiazide as is.   2. Overweight, current BMI 28.35 [name] is currently in the action stage of change. As such, her goal is to continue with weight loss efforts. She has agreed to the Category 2 Plan.   Exercise goals: As is.  Behavioral modification strategies: decreasing simple carbohydrates and decreasing liquid calories.  [name] has agreed to follow-up with our clinic in 3 weeks.  Objective:   Blood pressure 134/76, pulse 74, temperature 97.6 F (36.4 C), temperature source Oral, height _0  (1.6 m), weight 160 lb (72.6 kg), SpO2 99 %. Body mass  index is 28.34 kg/m.  General: Cooperative, alert, well developed, in no acute distress. HEENT: Conjunctivae and lids unremarkable. Cardiovascular: Regular rhythm.  Lungs: Normal work of breathing. Neurologic: No focal deficits.   Lab Results  Component Value Date   CREATININE 0.67 12/12/2020   BUN 11 12/12/2020   NA 133 (L) 12/12/2020   K 4.4 12/12/2020   CL 95 (L) 12/12/2020   CO2 24 12/12/2020   Lab Results  Component Value Date   ALT 11 12/12/2020   AST 24 12/12/2020   ALKPHOS 70 12/12/2020   BILITOT 0.5 12/12/2020   Lab Results  Component Value Date   HGBA1C 4.7 (L) 12/12/2020   HGBA1C 4.8 04/04/2020   HGBA1C 4.6 (L) 10/12/2019   HGBA1C 4.8 05/04/2019   HGBA1C 5.1 11/03/2018   Lab Results  Component Value Date   INSULIN 1.5 (L) 12/12/2020   INSULIN 4.0 04/04/2020   INSULIN 3.5 10/12/2019   INSULIN 6.9 08/02/2018   INSULIN 7.0 04/27/2018   Lab Results  Component Value Date   TSH 2.100 05/04/2019   Lab Results  Component Value Date   CHOL 150 12/12/2020   HDL 90 12/12/2020   LDLCALC 48 12/12/2020   TRIG 58 12/12/2020   CHOLHDL 2.7 04/09/2016   Lab Results  Component Value Date   WBC 5.0 12/12/2020   HGB 13.3 12/12/2020   HCT 39.7 12/12/2020   MCV 90 12/12/2020   PLT 227 12/12/2020   Lab Results  Component Value Date   IRON 52 12/12/2020   TIBC 290 12/12/2020  FERRITIN 83 12/12/2020    Obesity Behavioral Intervention:   Approximately 15 minutes were spent on the discussion below.  ASK: We discussed the diagnosis of obesity with [name] today and Jarita agreed to give [place] permission to discuss obesity behavioral modification therapy today.  ASSESS: [name] has the diagnosis of obesity and her BMI today is 28.35. [name] is in the action stage of change.   ADVISE: [name] was educated on the multiple health risks of obesity as well as the benefit of weight loss to improve her health. She was advised of the need for long term treatment and the  importance of lifestyle modifications to improve her current health and to decrease her risk of future health problems.  AGREE: Multiple dietary modification options and treatment options were discussed and [name] agreed to follow the recommendations documented in the above note.  ARRANGE: [name] was educated on the importance of frequent visits to treat obesity as outlined per CMS and USPSTF guidelines and agreed to schedule her next follow up appointment today.  Attestation Statements:   Reviewed by clinician on day of visit: allergies, medications, problem list, medical history, surgical history, family history, social history, and previous encounter notes.   [name], am acting as Location manager for [name], FNP-C.  I have reviewed the above documentation for accuracy and completeness, and I agree with the above. -  Georgianne Fick, FNP

## 2021-03-12 ENCOUNTER — Encounter (INDEPENDENT_AMBULATORY_CARE_PROVIDER_SITE_OTHER): Payer: Self-pay | Admitting: Family Medicine

## 2021-03-13 DIAGNOSIS — D4101 Neoplasm of uncertain behavior of right kidney: Secondary | ICD-10-CM | POA: Diagnosis not present

## 2021-03-13 DIAGNOSIS — Z8673 Personal history of transient ischemic attack (TIA), and cerebral infarction without residual deficits: Secondary | ICD-10-CM | POA: Diagnosis not present

## 2021-03-13 DIAGNOSIS — Z87891 Personal history of nicotine dependence: Secondary | ICD-10-CM | POA: Diagnosis not present

## 2021-03-13 DIAGNOSIS — Z7901 Long term (current) use of anticoagulants: Secondary | ICD-10-CM | POA: Diagnosis not present

## 2021-03-13 DIAGNOSIS — R590 Localized enlarged lymph nodes: Secondary | ICD-10-CM | POA: Diagnosis not present

## 2021-03-19 DIAGNOSIS — H353131 Nonexudative age-related macular degeneration, bilateral, early dry stage: Secondary | ICD-10-CM | POA: Diagnosis not present

## 2021-03-19 DIAGNOSIS — E119 Type 2 diabetes mellitus without complications: Secondary | ICD-10-CM | POA: Diagnosis not present

## 2021-03-19 DIAGNOSIS — H35371 Puckering of macula, right eye: Secondary | ICD-10-CM | POA: Diagnosis not present

## 2021-03-19 DIAGNOSIS — H43813 Vitreous degeneration, bilateral: Secondary | ICD-10-CM | POA: Diagnosis not present

## 2021-03-19 DIAGNOSIS — I10 Essential (primary) hypertension: Secondary | ICD-10-CM | POA: Diagnosis not present

## 2021-03-20 DIAGNOSIS — Z01818 Encounter for other preprocedural examination: Secondary | ICD-10-CM | POA: Diagnosis not present

## 2021-03-20 DIAGNOSIS — Z7984 Long term (current) use of oral hypoglycemic drugs: Secondary | ICD-10-CM | POA: Diagnosis not present

## 2021-03-20 DIAGNOSIS — E1142 Type 2 diabetes mellitus with diabetic polyneuropathy: Secondary | ICD-10-CM | POA: Diagnosis not present

## 2021-03-20 DIAGNOSIS — Z7901 Long term (current) use of anticoagulants: Secondary | ICD-10-CM | POA: Diagnosis not present

## 2021-03-20 DIAGNOSIS — D4101 Neoplasm of uncertain behavior of right kidney: Secondary | ICD-10-CM | POA: Diagnosis not present

## 2021-03-20 DIAGNOSIS — D49511 Neoplasm of unspecified behavior of right kidney: Secondary | ICD-10-CM | POA: Diagnosis not present

## 2021-03-20 DIAGNOSIS — I10 Essential (primary) hypertension: Secondary | ICD-10-CM | POA: Diagnosis not present

## 2021-03-20 DIAGNOSIS — Z87891 Personal history of nicotine dependence: Secondary | ICD-10-CM | POA: Diagnosis not present

## 2021-03-20 DIAGNOSIS — Z86718 Personal history of other venous thrombosis and embolism: Secondary | ICD-10-CM | POA: Diagnosis not present

## 2021-03-20 DIAGNOSIS — Z01812 Encounter for preprocedural laboratory examination: Secondary | ICD-10-CM | POA: Diagnosis not present

## 2021-03-20 DIAGNOSIS — Z01810 Encounter for preprocedural cardiovascular examination: Secondary | ICD-10-CM | POA: Diagnosis not present

## 2021-03-21 HISTORY — PX: NEPHRECTOMY: SHX65

## 2021-03-25 DIAGNOSIS — Z7901 Long term (current) use of anticoagulants: Secondary | ICD-10-CM | POA: Diagnosis not present

## 2021-03-25 DIAGNOSIS — Z96653 Presence of artificial knee joint, bilateral: Secondary | ICD-10-CM | POA: Diagnosis present

## 2021-03-25 DIAGNOSIS — Z86718 Personal history of other venous thrombosis and embolism: Secondary | ICD-10-CM | POA: Diagnosis not present

## 2021-03-25 DIAGNOSIS — Z8673 Personal history of transient ischemic attack (TIA), and cerebral infarction without residual deficits: Secondary | ICD-10-CM | POA: Diagnosis not present

## 2021-03-25 DIAGNOSIS — Z90710 Acquired absence of both cervix and uterus: Secondary | ICD-10-CM | POA: Diagnosis not present

## 2021-03-25 DIAGNOSIS — Z87891 Personal history of nicotine dependence: Secondary | ICD-10-CM | POA: Diagnosis not present

## 2021-03-25 DIAGNOSIS — E1142 Type 2 diabetes mellitus with diabetic polyneuropathy: Secondary | ICD-10-CM | POA: Diagnosis present

## 2021-03-25 DIAGNOSIS — R209 Unspecified disturbances of skin sensation: Secondary | ICD-10-CM | POA: Diagnosis not present

## 2021-03-25 DIAGNOSIS — I10 Essential (primary) hypertension: Secondary | ICD-10-CM | POA: Diagnosis present

## 2021-03-25 DIAGNOSIS — Z9884 Bariatric surgery status: Secondary | ICD-10-CM | POA: Diagnosis not present

## 2021-03-25 DIAGNOSIS — C641 Malignant neoplasm of right kidney, except renal pelvis: Secondary | ICD-10-CM | POA: Diagnosis present

## 2021-03-25 DIAGNOSIS — Z96641 Presence of right artificial hip joint: Secondary | ICD-10-CM | POA: Diagnosis present

## 2021-03-25 DIAGNOSIS — D4101 Neoplasm of uncertain behavior of right kidney: Secondary | ICD-10-CM | POA: Diagnosis not present

## 2021-03-25 DIAGNOSIS — N2889 Other specified disorders of kidney and ureter: Secondary | ICD-10-CM | POA: Diagnosis not present

## 2021-03-26 DIAGNOSIS — R209 Unspecified disturbances of skin sensation: Secondary | ICD-10-CM | POA: Diagnosis not present

## 2021-03-26 DIAGNOSIS — N2889 Other specified disorders of kidney and ureter: Secondary | ICD-10-CM | POA: Diagnosis not present

## 2021-03-31 ENCOUNTER — Other Ambulatory Visit (INDEPENDENT_AMBULATORY_CARE_PROVIDER_SITE_OTHER): Payer: Self-pay | Admitting: Family Medicine

## 2021-03-31 DIAGNOSIS — E1169 Type 2 diabetes mellitus with other specified complication: Secondary | ICD-10-CM

## 2021-03-31 NOTE — Telephone Encounter (Signed)
Pt last seen by Jake Bathe, FNP.

## 2021-04-03 ENCOUNTER — Ambulatory Visit (INDEPENDENT_AMBULATORY_CARE_PROVIDER_SITE_OTHER): Payer: Medicare Other | Admitting: Family Medicine

## 2021-04-03 NOTE — Telephone Encounter (Signed)
[employer] 04/03/2021 02:24 PM Insurance has received the Prior Authorization request for the drug Qutenza 8 % Topical System on 03/31/2021. It is currently under review. Pending Case ID: [id number].

## 2021-04-03 NOTE — Telephone Encounter (Signed)
Called [employer] spoke to [name] [name], will be contacting [employer] for PA decision (has not heard back).  Case [id number].

## 2021-04-04 ENCOUNTER — Ambulatory Visit: Payer: Medicare Other | Admitting: Plastic Surgery

## 2021-04-07 ENCOUNTER — Other Ambulatory Visit: Payer: Self-pay

## 2021-04-07 ENCOUNTER — Encounter: Payer: Self-pay | Admitting: Neurology

## 2021-04-07 ENCOUNTER — Ambulatory Visit (INDEPENDENT_AMBULATORY_CARE_PROVIDER_SITE_OTHER): Payer: Medicare Other | Admitting: Neurology

## 2021-04-07 VITALS — BP 132/81 | HR 73 | Ht 63.0 in | Wt 179.0 lb

## 2021-04-07 DIAGNOSIS — G609 Hereditary and idiopathic neuropathy, unspecified: Secondary | ICD-10-CM

## 2021-04-07 DIAGNOSIS — E1142 Type 2 diabetes mellitus with diabetic polyneuropathy: Secondary | ICD-10-CM | POA: Diagnosis not present

## 2021-04-07 DIAGNOSIS — N2889 Other specified disorders of kidney and ureter: Secondary | ICD-10-CM | POA: Diagnosis not present

## 2021-04-07 NOTE — Progress Notes (Signed)
[name]    Provider:  Dr Jaynee Eagles Referring Provider: [name], MD Primary Care Physician:  [name], MD   CC:  Stroke, Cavernous sinus mass s/p surgery, dizziness, peripheral neuropathy. Her risk factors are being overweight and prior smoking.   Interval history 04/07/2021: She had nephrectomy 2 weeks ago, also removed 2 lymph nodes, she is doing great, she even went to the gym, pathology has not returned yet. Surgeon says everything was removed, she may have to have scans but she may not have to have chemo or radiation.   She has her [facility] restorer, she feels much better with her neuropathy, at least >70% improvement in pain, increased quality of life, increased ability to exercise/mobility, she is doing great, she is using it every day, no side effects, 100% compliance and doing excellent in all regards.   Patient complains of symptoms per HPI as well as the following symptoms: s/p surgery doing well . Pertinent negatives and positives per HPI. All others negative   Interval history 02/05/2021: She has peripheral neuropathy. Diabetes and B12 thought to contribute to her neuropathy ongoing for 20+ years. Hgba1c now better with weight loss4.7, B12 414 and has been in a good range the last year, we did genetic testing, we checked b1, b6, b12, tsh, ana/reflex, IFE/Spep, heavy metals, RF celiac Abs, hep c, rpr, sjogren's, sed rate, it is more numb, she has a burning electrical pain on the ventral medial surface, genetic panel invitae was negative 80 genes plus amyloid. 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 MRI of the lumbar spine in the past showed some mild stenosis and multilevel spondylosis,, she is having lower back pain, may repeat the MRI of the lumbar spine for worsening spinal stenosis.   Patient complains of symptoms per HPI as well as the following symptoms:intentional  weight loss . Pertinent negatives and positives per HPI. All others negative    Interval history 07/09/2019: Can come off of xarelto for surgery. 3 years without afib. If we are asked happy to write her a note. She had a massage Wednesday and Thursday she had an epicode of vertigo, she turned over in bed and had a second episode. She felt dizzy for a few days and then again had imbalance and fell with dizziness. She had vestibular therapy and was better. Happy to give her valium as needed. She recently had epley maneuver teaching again and feels better.   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  In the past we discussed smoking which can cause cerebrovascular atherosclerosis.  We recommended the healthy weight and wellness center.  No signs or symptoms of sleep apnea, and had a negative sleep eval in the past.  She also had a cavernous sinus mass status post removal. She has had a loop recorder placed.  She is on Gabapentin for neuropathy, Xarelto for stroke prevention. Takes Vitamin B supplements. She had bariatric surgery, resolved OSA. Loop hasn;t shown any afib or aflutter. Continues the Xarelto. She goes yearly for MRi to evaluate for previous sinus mass. Gabapentin helps. She continues to take B12 daily. HgbA1c 6.2 up a bit. She follows for cholesterol. She is taking Calcium and vitamin D daily now since showing Osteopenia. Weight stable.   Interval history 01/18/2017:  Patient is here for follow up. 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  Feet are burning a little more. 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  Recommend the Healthy Weight and [place] to address her obesity and multiple joint pain. She does not snore and is not tired during the day, had a borderline sleep  evaluation in the past will monitor for OSA, The burning in her feet is worsening.  More noticeable at night. She is on Lipoic acid daily.    Interval history 07/20/2016: She has had neuropathy for years.She has had neuropathy for over 15 years or longer. It started in the feet. Numbness, burning in the feet, they feel cold to the touvh but she feels them burning, she has pain in the feet. She tried Lyrica possible in a neuropathy drug trial and she believe she had side effects. Worse at night in bed. Cramping in the feet.    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 She does not know how long she had B12 deficiency. Her last B12 was normal 843. She takes oral supplements. Also HgbA1c 6.0. TSH 2.070. She also has a history of diabetes which improved after bariatric surgery but still elevated 6.0. HgbA1c was as high as 7.7 in the past.    Interval history 06/09/2016: Since being seen patient has had another stroke. She was admitted 04/10/2016. She had newly identified clot by LE by doppler but TCD bubble study was negative. She was discharged on Xarelto. Loop recorder was placed. She presented to the ED with slurred speech. Most symptoms have resolved. She cannot walk very well, her legs and feet are very numb. She has numbness in the feet and legs. She has imbalance. And she has had a history of B12 deficiency. She still has the mouth numbness and right hand sensory changes from previous stroke but the aphasia is resolved.    Ct Head Wo Contrast 04/08/2016  1. 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 2. New focal low-density in the left posterior frontal periventricular white matter, potentially a subacute small vessel infarct. Extensive chronic small vessel ischemic changes in the periventricular white matter are otherwise stable. No evidence of acute cortical stroke or hemorrhage.    Mr Brain Wo  Contrast 04/08/2016  1. Confirmation of acute nonhemorrhagic white matter infarct involving the left corona radiata and posterior centrum semi of ally. 2. Otherwise stable severe periventricular and diffuse subcortical T2 hyperintensities, advanced for age. This represents the sequela of chronic microvascular ischemia. 3. 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    Ct Angio Head & Neck W Or Wo Contrast 04/09/2016  1. Positive for arterial abnormalities associated with the acute white matter ischemia: Severe stenosis at the origin of a left MCA middle sylvian division M2 branch. Moderate irregularity and stenosis in anterior division left M2/ M3 branches. No left MCA M1 stenosis or occlusion. 2. Otherwise carotid bifurcation and ICA siphon atherosclerosis without hemodynamically significant stenosis. 3. Negative posterior circulation. 4. Stable CT appearance of the brain since yesterday. No associated hemorrhage or mass effect. 5. Abnormal CT appearance of the sella turcica and left cavernous sinus corresponding to the chronic invasive pituitary adenoma. 6. No acute findings in the neck.    LE venous dopplers Right lower extremity is negative for deep vein thrombosis. The left  lower extremity is positive for deep vein thrombosis involving a small segment of the left mid femoral vein. There is no evidence of Baker's cyst bilaterally   TEE Normal TEE. No cardioembolic source.   TCD bubble study - negative for PFO   HPI:  AVALON COPPINGER is a 70 y.o. female here as a follow-up from the hospital after a stroke. She still has numbness in the right fingers and moth due to the left thalamic stroke. We discussed her strokes, she is due for Cardiac Monitoring and then possibly Loop. Will discuss with our Vascular doctors. Also discussed at length the invasive pituitary tumor shown, I reviewed imaging with patient and pointed  out findingd, discussed next steps including MRI w/wo with pituitary protocol as well as referral to [place].She was not on an aspirin when this happened.    Reviewed notes, labs and imaging from outside physicians, which showed:   IMPRESSION: 1. Acute lacunar infarct in the left thalamus. 2. Possible subacute lacunar infarct in the right splenium of the corpus callosum. 3. Moderate chronic small vessel ischemic disease. 4. Mass involving the sella, left cavernous sinus, and left sphenoid sinus, suspicious for invasive pituitary macroadenoma. Further evaluation with nonemergent pituitary protocol MRI (without and with contrast) is recommended. NEUROSURGERY REVIEWED AND FEELS THIS IS A MASS OF THE SPHENOID SINUS THAT HAS INVADED THE CAVERNOUS SINUS,NOT PITUITARY TUMOR 5. No major intracranial arterial occlusion or significant proximal posterior circulation stenosis. 6. Severe proximal left M2 MCA stenoses.   Review of Systems: Patient complains of symptoms per HPI as well as the following symptoms: No CP, no SOB, no fever, no chills. Pertinent negatives per HPI. All others negative.   Social History   Socioeconomic History   Marital status: Single    Spouse name: Not on file   Number of children: 0   Years of education: college   Highest education level: Not on file  Occupational History   Occupation: Retired  Tobacco Use   Smoking status: Former    Packs/day: 1.00    Years: 40.00    Pack years: 40.00    Types: Cigarettes    Quit date: 08/2006    Years since quitting: 14.6   Smokeless tobacco: Never  Vaping Use   Vaping Use: Never used  Substance and Sexual Activity   Alcohol use: Yes    Alcohol/week: 2.0 standard drinks    Types: 2 Glasses of wine per week    Comment: 07/06/2019 maybe 2 glasses of wine/week; 04/08/2016 "5-6 drinks/year'   Drug use: No    Types: Marijuana    Comment: 04/08/2016 "did some marijuana in college"   Sexual activity: Never    Birth  control/protection: Surgical  Other Topics Concern   Not on file  Social History Narrative   Lives at home alone   Right-handed   Drinks 4 cups of coffee a day    Social Determinants of Health   Financial Resource Strain: Not on file  Food Insecurity: Not on file  Transportation Needs: Not on file  Physical Activity: Not on file  Stress: Not on file  Social Connections: Not on file  Intimate Partner Violence: Not on file    Family History  Problem Relation Age of Onset   Neuropathy Mother    Hypertension Mother    Hyperlipidemia Mother    Thyroid disease Mother    Prostate cancer Father    Lymphoma Father    Hypertension Father    Hyperlipidemia Father  Cancer Father    Obesity Father    Congestive Heart Failure Maternal Grandmother    Lung cancer Maternal Grandfather     Past Medical History:  Diagnosis Date   Anemia    Arthritis    "knees, hips, possibly back" (04/08/2016)   Atrial septal aneurysm    B12 deficiency    a. following gastric bypass.   Back pain    Chronic lower back pain    spondylosis   Cold feet    Daily headache    "short ones for the last month or 2" (04/08/2016)   Depression    Diabetes (HCC)    Dry mouth    DVT (deep venous thrombosis) (HCC)    Easy bruising    Floaters in visual field    GERD (gastroesophageal reflux disease)    takes Protonix daily "just to protect my stomach; not for reflux" (04/08/2016)   Hay fever    Heart murmur    "dx'd by 1 anesthesiologist years and years ago"   History of loop recorder 03/2016   Hyperlipidemia    Hypertension    Joint pain    Leg cramp    Macular degeneration    "just watching"   Neuropathy    Nosebleed    Palpitations    Peripheral edema    Peripheral neuropathy    not on any meds   Pituitary mass ([place])    a. s/p endoscopic surgery 02/2016.   Pneumonia 12/2017   Sleep apnea    no CPAP since weight loss after bariatric surgery '12 (04/08/2016)   Spondylosis    Stroke ([place])  04/07/2016   a. 01/2016 and 03/2016. during admission had + LE DVT.   Swelling of extremity    TIA (transient ischemic attack)    Trouble in sleeping    Urinary incontinence    Weakness     Past Surgical History:  Procedure Laterality Date   ABDOMINOPLASTY     BRACHIOPLASTY     and more tissue removed during a second surgery   CATARACT EXTRACTION W/ INTRAOCULAR LENS  IMPLANT, BILATERAL Bilateral ~ 2012   COLONOSCOPY     EP IMPLANTABLE DEVICE N/A 04/10/2016   Procedure: Loop Recorder Insertion;  Surgeon: Will Meredith Leeds, MD;  Location: [place] [facility];  Service: Cardiovascular;  Laterality: N/A;   ESOPHAGOGASTRODUODENOSCOPY     in the 70's   FACIAL COSMETIC SURGERY     JOINT REPLACEMENT     MASTOPEXY     ROUX-EN-Y GASTRIC BYPASS  11/24/10   SINUS ENDO W/FUSION  02/28/2016   Procedure: ENDOSCOPIC SINUS SURGERY WITH NAVIGATION;  Surgeon: Ruby Cola, MD;  Location: [place];  Service: ENT;;   TEE WITHOUT CARDIOVERSION N/A 04/10/2016   Procedure: TRANSESOPHAGEAL ECHOCARDIOGRAM (TEE) POSSIBLE LOOP;  Surgeon: Sanda Klein, MD;  Location: [place];  Service: Cardiovascular;  Laterality: N/A;   [place] Right 2008   TOTAL HIP REVISION Right 03/30/2016   Procedure: RIGHT TOTAL HIP REVISION;  Surgeon: Frederik Pear, MD;  Location: [place];  Service: Orthopedics;  Laterality: Right;   TOTAL KNEE ARTHROPLASTY Bilateral 2001   VAGINAL HYSTERECTOMY     "left my ovaries"    Current Outpatient Medications  Medication Sig Dispense Refill   Cholecalciferol (VITAMIN D3) 250 MCG (10000 UT) capsule Take 1 capsule (10,000 Units total) by mouth daily. 30 capsule 0   diazepam (VALIUM) 2 MG tablet TAKE 1 TABLET BY MOUTH TWICE A DAY AS  NEEDED FOR VERTIGO     ferrous sulfate 325 (65 FE) MG tablet Take 325 mg by mouth 2 (two) times daily with a meal.     folic acid (FOLVITE) 409 MCG tablet Take 400 mcg by mouth daily.     gabapentin (NEURONTIN) 300 MG capsule Take 2  capsules (600 mg total) by mouth at bedtime. 180 capsule 4   hydrochlorothiazide (HYDRODIURIL) 25 MG tablet Take 1 tablet (25 mg total) by mouth daily. 90 tablet 0   losartan (COZAAR) 100 MG tablet Take 1 tablet (100 mg total) by mouth daily. 90 tablet 0   magnesium oxide (MAG-OX) 400 MG tablet Take 400 mg by mouth at bedtime.      metFORMIN (GLUCOPHAGE) 500 MG tablet Take 1 tablet (500 mg total) by mouth with breakfast, with lunch, and with evening meal. 270 tablet 0   Multiple Vitamins-Minerals (OCUVITE-LUTEIN PO) Take 1 tablet by mouth daily.     XARELTO 20 MG TABS tablet Take 20 mg by mouth daily.     No current facility-administered medications for this visit.    Allergies as of 04/07/2021 - Review Complete 03/12/2021  Allergen Reaction Noted   Ambien [zolpidem] Other (See Comments) 03/27/2016   Atorvastatin Other (See Comments) 02/24/2016   Codeine Nausea And Vomiting 05/29/2011   Penicillins Itching and Other (See Comments) 05/29/2011   Simvastatin Other (See Comments) 03/27/2016   Meclizine  07/06/2019   Meclizine hcl  11/18/2020   Other Other (See Comments) 11/18/2020   Byetta 10 mcg pen [exenatide] Nausea Only 03/27/2016    Vitals: BP 132/81   Pulse 73   Ht _0  (1.6 m)   Wt 179 lb (81.2 kg)   BMI 31.71 kg/m  Last Weight:  Wt Readings from Last 1 Encounters:  04/07/21 179 lb (81.2 kg)   Last Height:   Ht Readings from Last 1 Encounters:  04/07/21 _1  (1.6 m)  Exam: NAD, pleasant                  Speech:    Speech is normal; fluent and spontaneous with normal comprehension.  Cognition:    The patient is oriented to person, place, and time;     recent and remote memory intact;     language fluent;    Cranial Nerves:    The pupils are equal, round, and reactive to light.Trigeminal sensation is intact and the muscles of mastication are normal. The face is symmetric. The palate elevates in the midline. Hearing intact. Voice is normal. Shoulder shrug is normal.  The tongue has normal motion without fasciculations.   Coordination:  No dysmetria  Motor Observation:    No asymmetry, no atrophy, and no involuntary movements noted. Tone:    Normal muscle tone.     Strength:    Strength is V/V in the upper and lower limbs.         Sensation(improved): decreased pin prick and temp to the mid calves, absent vibration to the ankles, intact proprioception. Numbness right knee. Decreased hypesthesias and dyesthesias since using [place] restorer  Absent AJs. stable      Assessment and Plan: Ms. [name] [name] is a 70 y.o. female with history of HTN, HLD,TIA/stroke, diet controlled DM type 2, peripheral neuropathy,s/p gastric bypass. .  L corona radiata/posterior centrum semiovale infarct, etiology not clear. She had left thalamic and right splenium punctate infarcts in 01/2016, suspicious for cardioembolic infarcts. Pt does have severe L M2 inferior division stenosis and recent hypotension,  but the distribution of M2 inferior does not fit into these infarcts. TEE normal and loop placed. Found to have DVT likely due to recent hip surgery but TCD bubble study no PFO.     She has her [facility] restorer, she feels much better with her neuropathy, at least >70% improvement in pain, increased quality of life, increased ability to exercise/mobility, she is doing great, she is using it every day, no side effects, 100% compliance and doing excellent in all regards.   Recommend [place] Restorer for her neuropathy: "Consider the [facility] RestorerT stimulator as a potential alternative to traditional treatment to neuropathic pain. The [place] RestorerT is a small electrical stimulator that provides impulses through silver conductive garments that can increase blood flow and oxygenation in patients with neuropathy. Patients can wear the comfortable, stretchable silver conductive garments (sock, glove) on their extremities, which is far more effective than typical small 2" or 3" round  self-adhesive electrodes and difficult to administer. Patients have the option to administer Program 1, which is a sixty minute treatment performed 2-3 times daily, or Program 2, which is a nighttime treatment which helps for patients with burning, prickling pain that sometimes occurs at night."  - She has peripheral neuropathy. Diabetes and B12 thought to contribute to her neuropathy ongoing for 20+ years. Hgba1c now 4.7, B12 414 and has been in a good range the last year, we did genetic testing, we checked b1, b6, b12, tsh, ana/reflex, IFE/Spep, heavy metals, RF celiac Abs, hep c, rpr, sjogren's, sed rate, it is more numb, she has a burning electrical pain on the ventral medial surface, genetic panel invitae was negative 80 genes plus amyloid. 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 MRI of the lumbar spine in the past showed some mild stenosis and multilevel spondylosis,, she is having lower back pain, may repeat the MRI of the lumbar spine for worsening spinal stenosis.  - Tried gabapentin, been to a chiropractor and paid out of pocket for treatments $6000, lyrica, tylenol, topical creams/lidocaine/compounded cream), elavil, etodolac, alpha lipoic acid, tramadol.   - numbness in the hands, been to the hand center, likely CTS.  - Can come off of xarelto for surgery. 3+ years without afib. If we are asked happy to write her a note.   - Vertigo. She had vestibular therapy and was better. Happy to give her valium as needed. She recently had epley maneuver teaching again and feels better.   - Genetic testing for polyneuroathy, worsening, extensive testing unremarkable  - continue Xarelto for stroke prevention, for life   PRIOR Assessment and plan previous appointments :   - Emg/ncs of the left arm and left leg showed length-dependent axonal predominantly sensory neuropathy. Risk factors include b12 deficiency and diabetes. Will  test for other causes of neuropathy. neurontin at night.Alpha-lipoic acid daily. Discussed side effects as per patient instructions.   - Loop recorder placed 7/21 no afib or aflutter for close to 2 years - TCD bubble negative for PF -  LDL, HgbA1c controlled -  BP goal 130-150 due to left M2 severe stenosis - Continue statin   - HgbA1c 6.2, goal < 7.0 - Hx OSA, no CPAP since bariatric surgery in 2012, she denies any signs or symptoms no fatigue, not nodding off, nor morning headaches, no excessive fatigue, no known snoring. (she lost weight 336 was her max weight now 255) - Recommend the healthy weight and wellness center: sent referral  No orders  of the defined types were placed in this encounter.  - continue Xarelto - Continue neurontin for neuropathy  Cc; Dr. Tawny Asal, MD  [facility] [address] [place], [place] [address]  Phone [phone] Fax [phone]  I spent 20 minutes of face-to-face and non-face-to-face time with patient on the  1. Diabetic polyneuropathy associated with type 2 diabetes mellitus ([place])   2. Idiopathic peripheral neuropathy   3. Renal mass    diagnosis.  This included previsit chart review, lab review, study review, order entry, electronic health record documentation, patient education on the different diagnostic and therapeutic options, counseling and coordination of care, risks and benefits of management, compliance, or risk factor reduction

## 2021-04-10 DIAGNOSIS — C641 Malignant neoplasm of right kidney, except renal pelvis: Secondary | ICD-10-CM | POA: Diagnosis not present

## 2021-04-17 ENCOUNTER — Ambulatory Visit: Payer: Medicare Other | Admitting: Neurology

## 2021-04-30 ENCOUNTER — Ambulatory Visit: Payer: Medicare Other | Admitting: Neurology

## 2021-05-02 DIAGNOSIS — M9903 Segmental and somatic dysfunction of lumbar region: Secondary | ICD-10-CM | POA: Diagnosis not present

## 2021-05-02 DIAGNOSIS — M9904 Segmental and somatic dysfunction of sacral region: Secondary | ICD-10-CM | POA: Diagnosis not present

## 2021-05-02 DIAGNOSIS — M7918 Myalgia, other site: Secondary | ICD-10-CM | POA: Diagnosis not present

## 2021-05-02 DIAGNOSIS — M25552 Pain in left hip: Secondary | ICD-10-CM | POA: Diagnosis not present

## 2021-05-02 DIAGNOSIS — M545 Low back pain, unspecified: Secondary | ICD-10-CM | POA: Diagnosis not present

## 2021-05-02 DIAGNOSIS — M9905 Segmental and somatic dysfunction of pelvic region: Secondary | ICD-10-CM | POA: Diagnosis not present

## 2021-05-07 DIAGNOSIS — M9904 Segmental and somatic dysfunction of sacral region: Secondary | ICD-10-CM | POA: Diagnosis not present

## 2021-05-07 DIAGNOSIS — M9905 Segmental and somatic dysfunction of pelvic region: Secondary | ICD-10-CM | POA: Diagnosis not present

## 2021-05-07 DIAGNOSIS — M9903 Segmental and somatic dysfunction of lumbar region: Secondary | ICD-10-CM | POA: Diagnosis not present

## 2021-05-07 DIAGNOSIS — M25552 Pain in left hip: Secondary | ICD-10-CM | POA: Diagnosis not present

## 2021-05-07 DIAGNOSIS — M7918 Myalgia, other site: Secondary | ICD-10-CM | POA: Diagnosis not present

## 2021-05-07 DIAGNOSIS — M545 Low back pain, unspecified: Secondary | ICD-10-CM | POA: Diagnosis not present

## 2021-05-08 ENCOUNTER — Other Ambulatory Visit (INDEPENDENT_AMBULATORY_CARE_PROVIDER_SITE_OTHER): Payer: Self-pay | Admitting: Family Medicine

## 2021-05-08 ENCOUNTER — Other Ambulatory Visit: Payer: Self-pay | Admitting: Neurology

## 2021-05-08 DIAGNOSIS — M1612 Unilateral primary osteoarthritis, left hip: Secondary | ICD-10-CM | POA: Diagnosis not present

## 2021-05-08 DIAGNOSIS — I10 Essential (primary) hypertension: Secondary | ICD-10-CM

## 2021-05-08 MED ORDER — XARELTO 20 MG PO TABS: 20.0000 mg | ORAL_TABLET | Freq: Every day | ORAL | 3 refills | Status: DC

## 2021-05-08 NOTE — Telephone Encounter (Signed)
We received a fax on May 07, 2021 from [facility] [employer] clinical call center that stated the prior authorization request form has been received for Qutenza.  The fax also stated not to resubmit the completed PA form as duplicate submissions will delay processing times.  All prior authorization requests received via fax are responded to be a notification fax within 5 business days.  Will update when we have a determination from [employer] employee program.

## 2021-05-08 NOTE — Telephone Encounter (Signed)
Last OV with [facility]

## 2021-05-12 ENCOUNTER — Other Ambulatory Visit: Payer: Self-pay | Admitting: Neurology

## 2021-05-12 MED ORDER — CAPSAICIN 0.075 % EX CREA: 1.0000 "application " | TOPICAL_CREAM | Freq: Three times a day (TID) | CUTANEOUS | 3 refills | Status: DC

## 2021-05-12 MED ORDER — LIDOCAINE-PRILOCAINE 2.5-2.5 % EX CREA: 1.0000 "application " | TOPICAL_CREAM | CUTANEOUS | 3 refills | Status: DC | PRN

## 2021-05-12 NOTE — Telephone Encounter (Signed)
Received a fax from the [facility] clinical call center. Qutenza has been denied due to the following reason: The use of this medication without an adequate response, intolerance, or contraindication to a topical lidocaine or another topical capsaicin product does not establish medical necessity for this drug.  Medical necessity is determined by adherence to the generally accepted standards of medical practice in [place], is clinically appropriate, in terms of type, frequency, extent, site, duration, and considered effective for the patient's illness, injury, disease or symptoms.  For more information, please refer to the [employer] and [facility] service benefit plan pressure.  Details regarding medical necessity are listed in section 10.  If appeal is desired, fax within 6 months to [phone].

## 2021-05-13 DIAGNOSIS — M1612 Unilateral primary osteoarthritis, left hip: Secondary | ICD-10-CM | POA: Diagnosis not present

## 2021-05-14 DIAGNOSIS — M545 Low back pain, unspecified: Secondary | ICD-10-CM | POA: Diagnosis not present

## 2021-05-14 DIAGNOSIS — M7918 Myalgia, other site: Secondary | ICD-10-CM | POA: Diagnosis not present

## 2021-05-14 DIAGNOSIS — M9905 Segmental and somatic dysfunction of pelvic region: Secondary | ICD-10-CM | POA: Diagnosis not present

## 2021-05-14 DIAGNOSIS — M9904 Segmental and somatic dysfunction of sacral region: Secondary | ICD-10-CM | POA: Diagnosis not present

## 2021-05-14 DIAGNOSIS — M25552 Pain in left hip: Secondary | ICD-10-CM | POA: Diagnosis not present

## 2021-05-14 DIAGNOSIS — M9903 Segmental and somatic dysfunction of lumbar region: Secondary | ICD-10-CM | POA: Diagnosis not present

## 2021-05-21 ENCOUNTER — Other Ambulatory Visit: Payer: Self-pay | Admitting: Family Medicine

## 2021-05-21 DIAGNOSIS — M545 Low back pain, unspecified: Secondary | ICD-10-CM | POA: Diagnosis not present

## 2021-05-21 DIAGNOSIS — M7918 Myalgia, other site: Secondary | ICD-10-CM | POA: Diagnosis not present

## 2021-05-21 DIAGNOSIS — M9903 Segmental and somatic dysfunction of lumbar region: Secondary | ICD-10-CM | POA: Diagnosis not present

## 2021-05-21 DIAGNOSIS — M9904 Segmental and somatic dysfunction of sacral region: Secondary | ICD-10-CM | POA: Diagnosis not present

## 2021-05-21 DIAGNOSIS — M9905 Segmental and somatic dysfunction of pelvic region: Secondary | ICD-10-CM | POA: Diagnosis not present

## 2021-05-21 DIAGNOSIS — M25552 Pain in left hip: Secondary | ICD-10-CM | POA: Diagnosis not present

## 2021-05-21 DIAGNOSIS — Z1231 Encounter for screening mammogram for malignant neoplasm of breast: Secondary | ICD-10-CM

## 2021-05-27 ENCOUNTER — Ambulatory Visit (INDEPENDENT_AMBULATORY_CARE_PROVIDER_SITE_OTHER): Payer: Medicare Other | Admitting: Family Medicine

## 2021-05-27 ENCOUNTER — Encounter (INDEPENDENT_AMBULATORY_CARE_PROVIDER_SITE_OTHER): Payer: Self-pay | Admitting: Family Medicine

## 2021-05-27 ENCOUNTER — Other Ambulatory Visit: Payer: Self-pay

## 2021-05-27 VITALS — BP 154/88 | HR 66 | Temp 97.8°F | Ht 63.0 in | Wt 159.0 lb

## 2021-05-27 DIAGNOSIS — D509 Iron deficiency anemia, unspecified: Secondary | ICD-10-CM | POA: Diagnosis not present

## 2021-05-27 DIAGNOSIS — M7918 Myalgia, other site: Secondary | ICD-10-CM | POA: Diagnosis not present

## 2021-05-27 DIAGNOSIS — E1169 Type 2 diabetes mellitus with other specified complication: Secondary | ICD-10-CM | POA: Diagnosis not present

## 2021-05-27 DIAGNOSIS — M25552 Pain in left hip: Secondary | ICD-10-CM | POA: Diagnosis not present

## 2021-05-27 DIAGNOSIS — E785 Hyperlipidemia, unspecified: Secondary | ICD-10-CM

## 2021-05-27 DIAGNOSIS — M545 Low back pain, unspecified: Secondary | ICD-10-CM | POA: Diagnosis not present

## 2021-05-27 DIAGNOSIS — E1159 Type 2 diabetes mellitus with other circulatory complications: Secondary | ICD-10-CM | POA: Diagnosis not present

## 2021-05-27 DIAGNOSIS — E559 Vitamin D deficiency, unspecified: Secondary | ICD-10-CM | POA: Diagnosis not present

## 2021-05-27 DIAGNOSIS — Z6841 Body Mass Index (BMI) 40.0 and over, adult: Secondary | ICD-10-CM | POA: Diagnosis not present

## 2021-05-27 DIAGNOSIS — I152 Hypertension secondary to endocrine disorders: Secondary | ICD-10-CM | POA: Diagnosis not present

## 2021-05-27 DIAGNOSIS — Z0000 Encounter for general adult medical examination without abnormal findings: Secondary | ICD-10-CM

## 2021-05-27 DIAGNOSIS — M9904 Segmental and somatic dysfunction of sacral region: Secondary | ICD-10-CM | POA: Diagnosis not present

## 2021-05-27 DIAGNOSIS — M9905 Segmental and somatic dysfunction of pelvic region: Secondary | ICD-10-CM | POA: Diagnosis not present

## 2021-05-27 DIAGNOSIS — M9903 Segmental and somatic dysfunction of lumbar region: Secondary | ICD-10-CM | POA: Diagnosis not present

## 2021-05-27 DIAGNOSIS — E538 Deficiency of other specified B group vitamins: Secondary | ICD-10-CM

## 2021-05-27 MED ORDER — LOSARTAN POTASSIUM-HCTZ 100-25 MG PO TABS: 1.0000 | ORAL_TABLET | Freq: Every day | ORAL | 0 refills | Status: DC

## 2021-05-28 ENCOUNTER — Encounter (INDEPENDENT_AMBULATORY_CARE_PROVIDER_SITE_OTHER): Payer: Self-pay | Admitting: Family Medicine

## 2021-05-28 NOTE — Progress Notes (Signed)
Chief Complaint:   OBESITY [name] [name] is here to discuss her progress with her obesity treatment plan along with follow-up of her obesity related diagnoses. [name] [name] is on the Category 2 Plan and states she is following her eating plan approximately 10% of the time. [name] [name] states she is gym and cardio 30 minutes 5 times per week.  Today's visit was #: 39 Starting weight: 258 lbs Starting date: 04/27/2018 Today's weight: 159 lbs Today's date: 05/27/2021 Total lbs lost to date: 99 lbs Total lbs lost since last in-office visit: 1 lb  Interim History: [name] [name] is happy with 1 lb loss. She has not been here since June 21st. She was treated for kidney cancer ( nephrectomy only). She is doing a great job with exercise but notes she has been off her meal plan quite a bit.  Subjective:   1. Hypertension associated with type 2 diabetes mellitus ([name] [name]) [name] [name]'s blood pressure is elevated today but generally well controlled. She is on HCTZ 25 mg and Losartan 100 mg.  BP Readings from Last 3 Encounters:  05/27/21 (!) 154/88  04/07/21 132/81  03/11/21 134/76    2. Hyperlipidemia associated with type 2 diabetes mellitus ([place]) [name] [name] stopped Crestor 2 months ago. Her last LDL was 48. Her HDL and Triglycerides were within normal limits.  Lab Results  Component Value Date   CHOL 150 12/12/2020   HDL 90 12/12/2020   LDLCALC 48 12/12/2020   TRIG 58 12/12/2020   CHOLHDL 2.7 04/09/2016   Lab Results  Component Value Date   ALT 11 12/12/2020   AST 24 12/12/2020   ALKPHOS 70 12/12/2020   BILITOT 0.5 12/12/2020   The ASCVD Risk score Mikey Bussing DC Jr., et al., 2013) failed to calculate for the following reasons:   The patient has a prior MI or stroke diagnosis   3. Iron deficiency anemia, unspecified iron deficiency anemia type [name] [name] stopped taking oral iron iron 1 month ago.  She has a history of gastric bypass.  CBC Latest Ref Rng & Units 12/12/2020 06/27/2020 04/04/2020  WBC 3.4 - 10.8 x10E3/uL  5.0 4.4 -  Hemoglobin 11.1 - 15.9 g/dL 13.3 14.3 -  Hematocrit 34.0 - 46.6 % 39.7 41.1 57.8(H)  Platelets 150 - 450 x10E3/uL 227 235 -   Lab Results  Component Value Date   IRON 52 12/12/2020   TIBC 290 12/12/2020   FERRITIN 83 12/12/2020   Lab Results  Component Value Date   VITAMINB12 414 12/12/2020     4. Type 2 diabetes mellitus with other specified complication, without long-term current use of insulin (HCC) [name] [name] is on Metformin. Her diabetes mellitus is well controlled.  Lab Results  Component Value Date   HGBA1C 4.7 (L) 12/12/2020   HGBA1C 4.8 04/04/2020   HGBA1C 4.6 (L) 10/12/2019   Lab Results  Component Value Date   LDLCALC 48 12/12/2020   CREATININE 0.67 12/12/2020   Lab Results  Component Value Date   INSULIN 1.5 (L) 12/12/2020   INSULIN 4.0 04/04/2020   INSULIN 3.5 10/12/2019   INSULIN 6.9 08/02/2018   INSULIN 7.0 04/27/2018    5. Vitamin D deficiency [name] [name] is on OTC Vitamin D 10,000 IU daily. Her last Vitamin D was slightly low at 45.3.  Lab Results  Component Value Date   VD25OH 45.3 12/12/2020   VD25OH 44.6 06/27/2020   VD25OH 40.3 04/04/2020    6. B12 deficiency [name] [name] has a history of low B12. She is not on currently on oral supplementation.  Lab Results  Component Value Date   [id number] 269 12/12/2020     Assessment/Plan:   1. Hypertension associated with type 2 diabetes mellitus ([place]) [name] [name] will discontinue Losartan and HCTZ separately. [name] [name] agreed to start Losartan 100 mg/25 mg HCTZ combo pill for a 90 day supply with no refills.  BPs elevated recently. May need to add amlodipine.   - losartan-hydrochlorothiazide (HYZAAR) 100-25 MG tablet; Take 1 tablet by mouth daily.  Dispense: 90 tablet; Refill: 0  2. Hyperlipidemia associated with type 2 diabetes mellitus ([place])  We will check FLP today. ed in medical record.  - Lipid Panel With LDL/HDL Ratio  3. Iron deficiency anemia, unspecified iron deficiency anemia type We  will check CBC and anemia panel today.  - Folate - Iron and TIBC - Ferritin - CBC with Differential/Platelet  4. Type 2 diabetes mellitus with other specified complication, without long-term current use of insulin (HCC) Continue metformin. We will check labs today.   - Comprehensive metabolic panel - Hemoglobin A1c  5. Vitamin D deficiency  [name] [name] agrees to continue to take OTC Vitamin D 10,000 IU daily and she will follow-up for routine testing of Vitamin D, at least 2-3 times per year to avoid over-replacement. We will check Vitamin D on next office visit.  - VITAMIN D 25 Hydroxy (Vit-D Deficiency, Fractures)  6. B12 deficiency The diagnosis was reviewed with the patient. Counseling provided today, see below. We will check B12 and Folate. We will continue to monitor.   - Vitamin B12 - Folate  7. Overweight, current BMI 28.17 [name] [name] is currently in the action stage of change. As such, her goal is to continue with weight loss efforts. She has agreed to the Category 2 Plan.   We will get labs on next office visit.  Exercise goals:  As is.  Behavioral modification strategies: increasing lean protein intake and decreasing simple carbohydrates.  [name] [name] has agreed to follow-up with our clinic in 1 week (fasting).  Objective:   Blood pressure (!) 154/88, pulse 66, temperature 97.8 F (36.6 C), height _0  (1.6 m), weight 159 lb (72.1 kg), SpO2 97 %. Body mass index is 28.17 kg/m.  General: Cooperative, alert, well developed, in no acute distress. HEENT: Conjunctivae and lids unremarkable. Cardiovascular: Regular rhythm.  Lungs: Normal work of breathing. Neurologic: No focal deficits.   Lab Results  Component Value Date   CREATININE 0.67 12/12/2020   BUN 11 12/12/2020   NA 133 (L) 12/12/2020   K 4.4 12/12/2020   CL 95 (L) 12/12/2020   CO2 24 12/12/2020   Lab Results  Component Value Date   ALT 11 12/12/2020   AST 24 12/12/2020   ALKPHOS 70 12/12/2020   BILITOT  0.5 12/12/2020   Lab Results  Component Value Date   HGBA1C 4.7 (L) 12/12/2020   HGBA1C 4.8 04/04/2020   HGBA1C 4.6 (L) 10/12/2019   HGBA1C 4.8 05/04/2019   HGBA1C 5.1 11/03/2018   Lab Results  Component Value Date   INSULIN 1.5 (L) 12/12/2020   INSULIN 4.0 04/04/2020   INSULIN 3.5 10/12/2019   INSULIN 6.9 08/02/2018   INSULIN 7.0 04/27/2018   Lab Results  Component Value Date   TSH 2.100 05/04/2019   Lab Results  Component Value Date   CHOL 150 12/12/2020   HDL 90 12/12/2020   LDLCALC 48 12/12/2020   TRIG 58 12/12/2020   CHOLHDL 2.7 04/09/2016   Lab Results  Component Value Date   VD25OH 45.3 12/12/2020  VD25OH 44.6 06/27/2020   VD25OH 40.3 04/04/2020   Lab Results  Component Value Date   WBC 5.0 12/12/2020   HGB 13.3 12/12/2020   HCT 39.7 12/12/2020   MCV 90 12/12/2020   PLT 227 12/12/2020   Lab Results  Component Value Date   IRON 52 12/12/2020   TIBC 290 12/12/2020   FERRITIN 83 12/12/2020   Attestation Statements:   Reviewed by clinician on day of visit: allergies, medications, problem list, medical history, surgical history, family history, social history, and previous encounter notes.   I, Lizbeth Bark, RMA, am acting as Location manager for [name], [place].   I have reviewed the above documentation for accuracy and completeness, and I agree with the above. -  Georgianne Fick, FNP

## 2021-06-03 DIAGNOSIS — M1612 Unilateral primary osteoarthritis, left hip: Secondary | ICD-10-CM | POA: Diagnosis not present

## 2021-06-04 ENCOUNTER — Ambulatory Visit (INDEPENDENT_AMBULATORY_CARE_PROVIDER_SITE_OTHER): Payer: Medicare Other | Admitting: Family Medicine

## 2021-06-04 ENCOUNTER — Other Ambulatory Visit: Payer: Self-pay

## 2021-06-04 ENCOUNTER — Encounter (INDEPENDENT_AMBULATORY_CARE_PROVIDER_SITE_OTHER): Payer: Self-pay | Admitting: Family Medicine

## 2021-06-04 VITALS — BP 130/78 | HR 71 | Temp 97.6°F | Ht 63.0 in | Wt 164.0 lb

## 2021-06-04 DIAGNOSIS — K5909 Other constipation: Secondary | ICD-10-CM | POA: Diagnosis not present

## 2021-06-04 DIAGNOSIS — Z6841 Body Mass Index (BMI) 40.0 and over, adult: Secondary | ICD-10-CM

## 2021-06-04 DIAGNOSIS — E1169 Type 2 diabetes mellitus with other specified complication: Secondary | ICD-10-CM

## 2021-06-04 DIAGNOSIS — Z23 Encounter for immunization: Secondary | ICD-10-CM | POA: Diagnosis not present

## 2021-06-04 MED ORDER — METFORMIN HCL 500 MG PO TABS: 500.0000 mg | ORAL_TABLET | Freq: Three times a day (TID) | ORAL | 0 refills | Status: DC

## 2021-06-04 NOTE — Progress Notes (Signed)
Chief Complaint:   OBESITY Ahnyla is here to discuss her progress with her obesity treatment plan along with follow-up of her obesity related diagnoses. Simona is on the Category 2 Plan and states she is following her eating plan approximately 30% of the time. Taite states she is doing cardio and weights for  30 minutes 5-6 times per week.  Today's visit was #: 47 Starting weight: 258 lbs Starting date: 04/27/2018 Today's weight: 164 lbs Today's date:06/04/2021 Total lbs lost to date: 94 lbs Total lbs lost since last in-office visit: +5  Interim History: Rogene has been deviating from plan significantly since last office visit. She says she has only had a few days on plan. She is on plan at breakfast but snacks between  meals quite a bit.  Subjective:   1. Other constipation Sophiana notes recent issues with constipation. Her water intake has been low. She has used Metamucil without good results.  2. Type 2 diabetes mellitus with other specified complication, without long-term current use of insulin ([place]) Jaslyne's diabetes mellitus is well controlled. She is on Metformin 3 times daily for appetite control.   Lab Results  Component Value Date   HGBA1C 4.7 (L) 12/12/2020   HGBA1C 4.8 04/04/2020   HGBA1C 4.6 (L) 10/12/2019   Lab Results  Component Value Date   LDLCALC 48 12/12/2020   CREATININE 0.67 12/12/2020   Lab Results  Component Value Date   INSULIN 1.5 (L) 12/12/2020   INSULIN 4.0 04/04/2020   INSULIN 3.5 10/12/2019   INSULIN 6.9 08/02/2018   INSULIN 7.0 04/27/2018    Assessment/Plan:   1. Other constipation Rickayla will try Miralax PRN. May take daily. Increase water intake.   2. Type 2 diabetes mellitus with other specified complication, without long-term current use of insulin (HCC) Rheanna will continue Metformin.  - metFORMIN (GLUCOPHAGE) 500 MG tablet; Take 1 tablet (500 mg total) by mouth with breakfast, with lunch, and with evening meal.  Dispense:  270 tablet; Refill: 0  3. Overweight, current BMI 29.06 Merilyn is currently in the action stage of change. As such, her goal is to continue with weight loss efforts. She has agreed to the Category 2 Plan.   Exercise goals:  As is.  Behavioral modification strategies: increasing lean protein intake and decreasing simple carbohydrates.  Fiana has agreed to follow-up with our clinic in 4 weeks.  Objective:   Blood pressure 130/78, pulse 71, temperature 97.6 F (36.4 C), height 5' 3" (1.6 m), weight 164 lb (74.4 kg), SpO2 97 %. Body mass index is 29.05 kg/m.  General: Cooperative, alert, well developed, in no acute distress. HEENT: Conjunctivae and lids unremarkable. Cardiovascular: Regular rhythm.  Lungs: Normal work of breathing. Neurologic: No focal deficits.   Lab Results  Component Value Date   CREATININE 0.67 12/12/2020   BUN 11 12/12/2020   NA 133 (L) 12/12/2020   K 4.4 12/12/2020   CL 95 (L) 12/12/2020   CO2 24 12/12/2020   Lab Results  Component Value Date   ALT 11 12/12/2020   AST 24 12/12/2020   ALKPHOS 70 12/12/2020   BILITOT 0.5 12/12/2020   Lab Results  Component Value Date   HGBA1C 4.7 (L) 12/12/2020   HGBA1C 4.8 04/04/2020   HGBA1C 4.6 (L) 10/12/2019   HGBA1C 4.8 05/04/2019   HGBA1C 5.1 11/03/2018   Lab Results  Component Value Date   INSULIN 1.5 (L) 12/12/2020   INSULIN 4.0 04/04/2020   INSULIN 3.5 10/12/2019  INSULIN 6.9 08/02/2018   INSULIN 7.0 04/27/2018   Lab Results  Component Value Date   TSH 2.100 05/04/2019   Lab Results  Component Value Date   CHOL 150 12/12/2020   HDL 90 12/12/2020   LDLCALC 48 12/12/2020   TRIG 58 12/12/2020   CHOLHDL 2.7 04/09/2016   Lab Results  Component Value Date   VD25OH 45.3 12/12/2020   VD25OH 44.6 06/27/2020   VD25OH 40.3 04/04/2020   Lab Results  Component Value Date   WBC 5.0 12/12/2020   HGB 13.3 12/12/2020   HCT 39.7 12/12/2020   MCV 90 12/12/2020   PLT 227 12/12/2020   Lab Results   Component Value Date   IRON 52 12/12/2020   TIBC 290 12/12/2020   FERRITIN 83 12/12/2020    Obesity Behavioral Intervention:   Approximately 15 minutes were spent on the discussion below.  ASK: We discussed the diagnosis of obesity with [name] today and Skyah agreed to give [place] permission to discuss obesity behavioral modification therapy today.  ASSESS: Dyllan has the diagnosis of obesity and her BMI today is 29.1. Ellorie is in the action stage of change.   ADVISE: Allahna was educated on the multiple health risks of obesity as well as the benefit of weight loss to improve her health. She was advised of the need for long term treatment and the importance of lifestyle modifications to improve her current health and to decrease her risk of future health problems.  AGREE: Multiple dietary modification options and treatment options were discussed and Afnan agreed to follow the recommendations documented in the above note.  ARRANGE: Elena was educated on the importance of frequent visits to treat obesity as outlined per CMS and USPSTF guidelines and agreed to schedule her next follow up appointment today.  Attestation Statements:   Reviewed by clinician on day of visit: allergies, medications, problem list, medical history, surgical history, family history, social history, and previous encounter notes.   I, Lizbeth Bark, RMA, am acting as Location manager for [name], [place].   I have reviewed the above documentation for accuracy and completeness, and I agree with the above. -  Georgianne Fick, FNP

## 2021-06-11 DIAGNOSIS — E1169 Type 2 diabetes mellitus with other specified complication: Secondary | ICD-10-CM | POA: Diagnosis not present

## 2021-06-11 DIAGNOSIS — E559 Vitamin D deficiency, unspecified: Secondary | ICD-10-CM | POA: Diagnosis not present

## 2021-06-11 DIAGNOSIS — Z0000 Encounter for general adult medical examination without abnormal findings: Secondary | ICD-10-CM | POA: Diagnosis not present

## 2021-06-11 DIAGNOSIS — E538 Deficiency of other specified B group vitamins: Secondary | ICD-10-CM | POA: Diagnosis not present

## 2021-06-11 DIAGNOSIS — D509 Iron deficiency anemia, unspecified: Secondary | ICD-10-CM | POA: Diagnosis not present

## 2021-06-11 DIAGNOSIS — E785 Hyperlipidemia, unspecified: Secondary | ICD-10-CM | POA: Diagnosis not present

## 2021-06-12 LAB — CBC WITH DIFFERENTIAL/PLATELET
Basophils Absolute: 0.1 10*3/uL (ref 0.0–0.2)
Basos: 1 %
EOS (ABSOLUTE): 0.1 10*3/uL (ref 0.0–0.4)
Eos: 3 %
Hematocrit: 35.6 % (ref 34.0–46.6)
Hemoglobin: 11.6 g/dL (ref 11.1–15.9)
Immature Grans (Abs): 0 10*3/uL (ref 0.0–0.1)
Immature Granulocytes: 0 %
Lymphocytes Absolute: 1 10*3/uL (ref 0.7–3.1)
Lymphs: 19 %
MCH: 29 pg (ref 26.6–33.0)
MCHC: 32.6 g/dL (ref 31.5–35.7)
MCV: 89 fL (ref 79–97)
Monocytes Absolute: 0.5 10*3/uL (ref 0.1–0.9)
Monocytes: 10 %
Neutrophils Absolute: 3.6 10*3/uL (ref 1.4–7.0)
Neutrophils: 67 %
Platelets: 261 10*3/uL (ref 150–450)
RBC: 4 x10E6/uL (ref 3.77–5.28)
RDW: 13.1 % (ref 11.7–15.4)
WBC: 5.4 10*3/uL (ref 3.4–10.8)

## 2021-06-12 LAB — COMPREHENSIVE METABOLIC PANEL
ALT: 16 IU/L (ref 0–32)
AST: 20 IU/L (ref 0–40)
Albumin/Globulin Ratio: 2.2 (ref 1.2–2.2)
Albumin: 4.2 g/dL (ref 3.8–4.8)
Alkaline Phosphatase: 65 IU/L (ref 44–121)
BUN/Creatinine Ratio: 17 (ref 12–28)
BUN: 16 mg/dL (ref 8–27)
Bilirubin Total: 0.3 mg/dL (ref 0.0–1.2)
CO2: 24 mmol/L (ref 20–29)
Calcium: 9.3 mg/dL (ref 8.7–10.3)
Chloride: 92 mmol/L — ABNORMAL LOW (ref 96–106)
Creatinine, Ser: 0.94 mg/dL (ref 0.57–1.00)
Globulin, Total: 1.9 g/dL (ref 1.5–4.5)
Glucose: 72 mg/dL (ref 65–99)
Potassium: 5.1 mmol/L (ref 3.5–5.2)
Sodium: 131 mmol/L — ABNORMAL LOW (ref 134–144)
Total Protein: 6.1 g/dL (ref 6.0–8.5)
eGFR: 65 mL/min/{1.73_m2} (ref >59)

## 2021-06-12 LAB — HEMOGLOBIN A1C
Est. average glucose Bld gHb Est-mCnc: 91 mg/dL
Hgb A1c MFr Bld: 4.8 % (ref 4.8–5.6)

## 2021-06-12 LAB — FERRITIN: Ferritin: 53 ng/mL (ref 15–150)

## 2021-06-12 LAB — T4, FREE: Free T4: 1.2 ng/dL (ref 0.82–1.77)

## 2021-06-12 LAB — LIPID PANEL WITH LDL/HDL RATIO
Cholesterol, Total: 197 mg/dL (ref 100–199)
HDL: 93 mg/dL (ref >39)
LDL Chol Calc (NIH): 90 mg/dL (ref 0–99)
LDL/HDL Ratio: 1 ratio (ref 0.0–3.2)
Triglycerides: 77 mg/dL (ref 0–149)
VLDL Cholesterol Cal: 14 mg/dL (ref 5–40)

## 2021-06-12 LAB — IRON AND TIBC
Iron Saturation: 23 % (ref 15–55)
Iron: 68 ug/dL (ref 27–139)
Total Iron Binding Capacity: 299 ug/dL (ref 250–450)
UIBC: 231 ug/dL (ref 118–369)

## 2021-06-12 LAB — FOLATE: Folate: 6.6 ng/mL (ref >3.0)

## 2021-06-12 LAB — T3: T3, Total: 65 ng/dL — ABNORMAL LOW (ref 71–180)

## 2021-06-12 LAB — VITAMIN B12: Vitamin B-12: 465 pg/mL (ref 232–1245)

## 2021-06-12 LAB — TSH: TSH: 1.93 u[IU]/mL (ref 0.450–4.500)

## 2021-06-12 LAB — VITAMIN D 25 HYDROXY (VIT D DEFICIENCY, FRACTURES): Vit D, 25-Hydroxy: 51.4 ng/mL (ref 30.0–100.0)

## 2021-06-17 DIAGNOSIS — Z96651 Presence of right artificial knee joint: Secondary | ICD-10-CM | POA: Diagnosis not present

## 2021-06-17 DIAGNOSIS — Z471 Aftercare following joint replacement surgery: Secondary | ICD-10-CM | POA: Diagnosis not present

## 2021-06-17 DIAGNOSIS — Z96652 Presence of left artificial knee joint: Secondary | ICD-10-CM | POA: Diagnosis not present

## 2021-06-17 NOTE — Telephone Encounter (Signed)
Resubmitted for qutenza on this pt, after trying capsacin and lidocaine with no relief.

## 2021-06-23 ENCOUNTER — Encounter (INDEPENDENT_AMBULATORY_CARE_PROVIDER_SITE_OTHER): Payer: Self-pay

## 2021-06-24 DIAGNOSIS — M7918 Myalgia, other site: Secondary | ICD-10-CM | POA: Diagnosis not present

## 2021-06-24 DIAGNOSIS — M9904 Segmental and somatic dysfunction of sacral region: Secondary | ICD-10-CM | POA: Diagnosis not present

## 2021-06-24 DIAGNOSIS — M9903 Segmental and somatic dysfunction of lumbar region: Secondary | ICD-10-CM | POA: Diagnosis not present

## 2021-06-24 DIAGNOSIS — M9905 Segmental and somatic dysfunction of pelvic region: Secondary | ICD-10-CM | POA: Diagnosis not present

## 2021-06-24 DIAGNOSIS — M25552 Pain in left hip: Secondary | ICD-10-CM | POA: Diagnosis not present

## 2021-06-24 DIAGNOSIS — M545 Low back pain, unspecified: Secondary | ICD-10-CM | POA: Diagnosis not present

## 2021-06-25 ENCOUNTER — Ambulatory Visit: Payer: Medicare Other | Admitting: Neurology

## 2021-06-27 ENCOUNTER — Other Ambulatory Visit: Payer: Self-pay

## 2021-06-27 ENCOUNTER — Ambulatory Visit
Admission: RE | Admit: 2021-06-27 | Discharge: 2021-06-27 | Disposition: A | Discharge status: Home / Self Care | Payer: Medicare Other | Source: Ambulatory Visit | Attending: Family Medicine | Admitting: Family Medicine

## 2021-06-27 DIAGNOSIS — Z1231 Encounter for screening mammogram for malignant neoplasm of breast: Secondary | ICD-10-CM

## 2021-07-02 ENCOUNTER — Ambulatory Visit (INDEPENDENT_AMBULATORY_CARE_PROVIDER_SITE_OTHER): Payer: Medicare Other | Admitting: Family Medicine

## 2021-07-03 DIAGNOSIS — Z86718 Personal history of other venous thrombosis and embolism: Secondary | ICD-10-CM | POA: Diagnosis not present

## 2021-07-03 DIAGNOSIS — I10 Essential (primary) hypertension: Secondary | ICD-10-CM | POA: Diagnosis not present

## 2021-07-03 DIAGNOSIS — D352 Benign neoplasm of pituitary gland: Secondary | ICD-10-CM | POA: Diagnosis not present

## 2021-07-03 DIAGNOSIS — E785 Hyperlipidemia, unspecified: Secondary | ICD-10-CM | POA: Diagnosis not present

## 2021-07-03 DIAGNOSIS — E1142 Type 2 diabetes mellitus with diabetic polyneuropathy: Secondary | ICD-10-CM | POA: Diagnosis not present

## 2021-07-03 DIAGNOSIS — Z0000 Encounter for general adult medical examination without abnormal findings: Secondary | ICD-10-CM | POA: Diagnosis not present

## 2021-07-03 DIAGNOSIS — E1169 Type 2 diabetes mellitus with other specified complication: Secondary | ICD-10-CM | POA: Diagnosis not present

## 2021-07-03 DIAGNOSIS — C641 Malignant neoplasm of right kidney, except renal pelvis: Secondary | ICD-10-CM | POA: Diagnosis not present

## 2021-07-03 DIAGNOSIS — Z8673 Personal history of transient ischemic attack (TIA), and cerebral infarction without residual deficits: Secondary | ICD-10-CM | POA: Diagnosis not present

## 2021-07-07 DIAGNOSIS — M9903 Segmental and somatic dysfunction of lumbar region: Secondary | ICD-10-CM | POA: Diagnosis not present

## 2021-07-07 DIAGNOSIS — M9905 Segmental and somatic dysfunction of pelvic region: Secondary | ICD-10-CM | POA: Diagnosis not present

## 2021-07-07 DIAGNOSIS — M25552 Pain in left hip: Secondary | ICD-10-CM | POA: Diagnosis not present

## 2021-07-07 DIAGNOSIS — M7918 Myalgia, other site: Secondary | ICD-10-CM | POA: Diagnosis not present

## 2021-07-07 DIAGNOSIS — M9904 Segmental and somatic dysfunction of sacral region: Secondary | ICD-10-CM | POA: Diagnosis not present

## 2021-07-07 DIAGNOSIS — M545 Low back pain, unspecified: Secondary | ICD-10-CM | POA: Diagnosis not present

## 2021-07-10 DIAGNOSIS — C641 Malignant neoplasm of right kidney, except renal pelvis: Secondary | ICD-10-CM | POA: Diagnosis not present

## 2021-07-10 DIAGNOSIS — Z905 Acquired absence of kidney: Secondary | ICD-10-CM | POA: Diagnosis not present

## 2021-07-15 ENCOUNTER — Telehealth: Payer: Self-pay | Admitting: Neurology

## 2021-07-15 NOTE — Telephone Encounter (Signed)
Called [facility] re: pt coverage, where we are in the process.  Case [id number].  The agent is to call me back.

## 2021-07-15 NOTE — Telephone Encounter (Signed)
[facility]) called, requesting clinical notes to be able to move forward with PA for [facility].

## 2021-07-15 NOTE — Telephone Encounter (Signed)
Fax confirmation received for [facility] PA (need clinical notes for PA).  Last ofv note and 05/2021 phone/email message. To rebecca  [phone].

## 2021-07-18 DIAGNOSIS — B9689 Other specified bacterial agents as the cause of diseases classified elsewhere: Secondary | ICD-10-CM | POA: Diagnosis not present

## 2021-07-18 DIAGNOSIS — J208 Acute bronchitis due to other specified organisms: Secondary | ICD-10-CM | POA: Diagnosis not present

## 2021-07-22 ENCOUNTER — Ambulatory Visit (INDEPENDENT_AMBULATORY_CARE_PROVIDER_SITE_OTHER): Payer: Medicare Other | Admitting: Family Medicine

## 2021-07-23 ENCOUNTER — Telehealth: Payer: Self-pay

## 2021-07-23 ENCOUNTER — Encounter: Payer: Self-pay | Admitting: Gastroenterology

## 2021-07-23 ENCOUNTER — Ambulatory Visit (INDEPENDENT_AMBULATORY_CARE_PROVIDER_SITE_OTHER): Payer: Medicare Other | Admitting: Gastroenterology

## 2021-07-23 VITALS — BP 132/82 | HR 75 | Ht 63.0 in | Wt 169.0 lb

## 2021-07-23 DIAGNOSIS — Z1212 Encounter for screening for malignant neoplasm of rectum: Secondary | ICD-10-CM | POA: Diagnosis not present

## 2021-07-23 DIAGNOSIS — Z9229 Personal history of other drug therapy: Secondary | ICD-10-CM | POA: Diagnosis not present

## 2021-07-23 DIAGNOSIS — Z1211 Encounter for screening for malignant neoplasm of colon: Secondary | ICD-10-CM | POA: Diagnosis not present

## 2021-07-23 MED ORDER — SUTAB 1479-225-188 MG PO TABS: 1.0000 | ORAL_TABLET | Freq: Once | ORAL | 0 refills | Status: AC

## 2021-07-23 NOTE — Progress Notes (Signed)
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  The patient had a normal colonoscopy in 2012.  She has no known family history of colon cancer.  She has occasional constipation, but denies any other chronic GI symptoms, to include abdominal pain, diarrhea or blood in the stool.  She was started on Xarelto in July 2017 after a DVT while hospitalized.  For her constipation, she takes Metamucil on occasion, he will take Dulcolax or MiraLAX when it is particularly bad.  She had been having multiple TIAs prior to this, and she stopped having the TIAs while on Xarelto, so it was decided that she would stay on the Xarelto indefinitely for this reason.  The patient was recently diagnosed with localized renal cell carcinoma and underwent a right nephrectomy in July of this year.  The surgery was uncomplicated, and she does not require any further treatment. The patient denies any symptoms such as chest pain/pressure, shortness of breath, palpitations, lightheadedness, cough/wheezing, presyncope/syncope.         Past Medical History:  Diagnosis Date   Anemia    Arthritis    "knees, hips, possibly back" (04/08/2016)   Atrial septal aneurysm    B12 deficiency    a. following gastric bypass.   Back pain    Chronic lower back pain    spondylosis   Cold feet    Daily headache    "short ones for the last month or 2" (04/08/2016)   Depression    Diabetes (HCC)    Dry mouth    DVT (deep venous thrombosis) (HCC)    Easy bruising    Floaters in visual field    GERD (gastroesophageal reflux disease)    takes Protonix daily "just to protect my stomach; not for reflux" (04/08/2016)   Hay fever    Heart murmur    "dx'd by 1 anesthesiologist years and years ago"   History of loop recorder 03/2016   Hyperlipidemia    Hypertension    Joint pain    Leg cramp    Macular degeneration    "just  watching"   Neuropathy    Nosebleed    Palpitations    Peripheral edema    Peripheral neuropathy    not on any meds   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 s/p endoscopic surgery 02/2016.   Pneumonia 12/2017   Sleep apnea    no CPAP since weight loss after bariatric surgery '12 (04/08/2016)   Spondylosis    Stroke ([place]) 04/07/2016   a. 01/2016 and 03/2016. during admission had + LE DVT.   Swelling of extremity    TIA (transient ischemic attack)    Trouble in sleeping    Urinary incontinence    Weakness      Past Surgical History:  Procedure Laterality Date   ABDOMINOPLASTY     BRACHIOPLASTY     and more tissue removed during a second surgery   CATARACT EXTRACTION W/ INTRAOCULAR LENS  IMPLANT, BILATERAL Bilateral ~ 2012   COLONOSCOPY     EP IMPLANTABLE DEVICE N/A 04/10/2016   Procedure: Loop Recorder Insertion;  Surgeon: Will Meredith Leeds, MD;  Location: [place] [facility];  Service: Cardiovascular;  Laterality: N/A;   ESOPHAGOGASTRODUODENOSCOPY     in the 70's   FACIAL COSMETIC SURGERY     JOINT REPLACEMENT     MASTOPEXY  ROUX-EN-Y GASTRIC BYPASS  11/24/10   SINUS ENDO W/FUSION  02/28/2016   Procedure: ENDOSCOPIC SINUS SURGERY WITH NAVIGATION;  Surgeon: Ruby Cola, MD;  Location: [place];  Service: ENT;;   TEE WITHOUT CARDIOVERSION N/A 04/10/2016   Procedure: TRANSESOPHAGEAL ECHOCARDIOGRAM (TEE) POSSIBLE LOOP;  Surgeon: Sanda Klein, MD;  Location: [place];  Service: Cardiovascular;  Laterality: N/A;   [place] Right 2008   TOTAL HIP REVISION Right 03/30/2016   Procedure: RIGHT TOTAL HIP REVISION;  Surgeon: Frederik Pear, MD;  Location: [place];  Service: Orthopedics;  Laterality: Right;   TOTAL KNEE ARTHROPLASTY Bilateral 2001   VAGINAL HYSTERECTOMY     "left my ovaries"   Family History  Problem Relation Age of Onset   Neuropathy Mother    Hypertension Mother    Hyperlipidemia Mother    Thyroid disease Mother    Prostate cancer Father     Lymphoma Father    Hypertension Father    Hyperlipidemia Father    Cancer Father    Obesity Father    Congestive Heart Failure Maternal Grandmother    Lung cancer Maternal Grandfather    Social History   Tobacco Use   Smoking status: Former    Packs/day: 1.00    Years: 40.00    Pack years: 40.00    Types: Cigarettes    Quit date: 08/2006    Years since quitting: 14.9   Smokeless tobacco: Never  Vaping Use   Vaping Use: Never used  Substance Use Topics   Alcohol use: Yes    Alcohol/week: 2.0 standard drinks    Types: 2 Glasses of wine per week    Comment: 07/06/2019 maybe 2 glasses of wine/week; 04/08/2016 "5-6 drinks/year'   Drug use: No    Types: Marijuana    Comment: 04/08/2016 "did some marijuana in college"   Current Outpatient Medications  Medication Sig Dispense Refill   capsicum (ZOSTRIX) 0.075 % topical cream Apply 1 application topically 3 (three) times daily. 5 g 3   Cholecalciferol (VITAMIN D3) 250 MCG (10000 UT) capsule Take 1 capsule (10,000 Units total) by mouth daily. 30 capsule 0   diazepam (VALIUM) 2 MG tablet TAKE 1 TABLET BY MOUTH TWICE A DAY AS NEEDED FOR VERTIGO     gabapentin (NEURONTIN) 300 MG capsule Take 2 capsules (600 mg total) by mouth at bedtime. 180 capsule 4   lidocaine-prilocaine (EMLA) cream Apply 1 application topically as needed. 30 g 3   losartan-hydrochlorothiazide (HYZAAR) 100-25 MG tablet Take 1 tablet by mouth daily. 90 tablet 0   magnesium oxide (MAG-OX) 400 MG tablet Take 400 mg by mouth at bedtime.      metFORMIN (GLUCOPHAGE) 500 MG tablet Take 1 tablet (500 mg total) by mouth with breakfast, with lunch, and with evening meal. 270 tablet 0   Multiple Vitamins-Minerals (OCUVITE-LUTEIN PO) Take 1 tablet by mouth daily.     XARELTO 20 MG TABS tablet Take 1 tablet (20 mg total) by mouth daily with supper. 90 tablet 3   No current facility-administered medications for this visit.   Allergies  Allergen Reactions   Ambien [Zolpidem]  Other (See Comments)    NIGHT TERRORS   Atorvastatin Other (See Comments)    Muscle weakness Muscle Aches   Codeine Nausea And Vomiting   Penicillins Itching and Other (See Comments)    Made nose run uncontrollably Has patient had a PCN reaction causing immediate rash, facial/tongue/throat swelling, SOB or lightheadedness with hypotension: No Has  patient had a PCN reaction causing severe rash involving mucus membranes or skin necrosis: No Has patient had a PCN reaction that required hospitalization No Has patient had a PCN reaction occurring within the last 10 years: No If all of the above answers are "NO", then may proceed with Cephalosporin use.   Simvastatin Other (See Comments)    Muscle weakness   Meclizine     Felt like her heart was pounding, "made me crazy", didn't help the dizziness   Meclizine Hcl     Other reaction(s): jittery, heart racing   Other Other (See Comments)   Byetta 10 Mcg Pen [Exenatide] Nausea Only     Review of Systems: All systems reviewed and negative except where noted in HPI.    MM 3D SCREEN BREAST BILATERAL  Result Date: 07/01/2021 CLINICAL DATA:  Screening. EXAM: DIGITAL SCREENING BILATERAL MAMMOGRAM WITH TOMOSYNTHESIS AND CAD TECHNIQUE: Bilateral screening digital craniocaudal and mediolateral oblique mammograms were obtained. Bilateral screening digital breast tomosynthesis was performed. The images were evaluated with computer-aided detection. COMPARISON:  Previous exam(s). ACR Breast Density Category c: The breast tissue is heterogeneously dense, which may obscure small masses. FINDINGS: Interval bilateral postoperative changes of breast lift. A cardiac device overlies the inner central left breast at posterior depth. There are no findings suspicious for malignancy. IMPRESSION: No mammographic evidence of malignancy. A result letter of this screening mammogram will be mailed directly to the patient. RECOMMENDATION: Screening mammogram in one year.  (Code:SM-B-01Y) BI-RADS CATEGORY  2: Benign. Electronically Signed   By: Ileana Roup M.D.   On: 07/01/2021 17:28    Physical Exam: Ht _0  (1.6 m)   Wt 169 lb (76.7 kg)   BMI 29.94 kg/m  Constitutional: Pleasant,well-developed, Caucasian female in no acute distress. HEENT: Normocephalic and atraumatic. Conjunctivae are normal. No scleral icterus.  Mallampati 2 Neck supple.  Cardiovascular: Normal rate, regular rhythm.  Systolic ejection murmur at right upper sternal border (3/6) Pulmonary/chest: Effort normal and breath sounds normal. No wheezing, rales or rhonchi. Abdominal: Soft, nondistended, nontender. Bowel sounds active throughout. There are no masses palpable. No hepatomegaly. Extremities: no edema Lymphadenopathy: No cervical adenopathy noted. Neurological: Alert and oriented to person place and time. Skin: Skin is warm and dry. No rashes noted. Psychiatric: Normal mood and affect. Behavior is normal.  CBC    Component Value Date/Time   WBC 5.4 06/11/2021 0813   WBC 8.0 06/04/2019 1050   RBC 4.00 06/11/2021 0813   RBC 4.77 06/04/2019 1050   HGB 11.6 06/11/2021 0813   HGB 11.9 04/29/2017 0944   HCT 35.6 06/11/2021 0813   HCT 36.9 04/29/2017 0944   PLT 261 06/11/2021 0813   MCV 89 06/11/2021 0813   MCV 81.5 04/29/2017 0944   MCH 29.0 06/11/2021 0813   MCH 28.5 06/04/2019 1050   MCHC 32.6 06/11/2021 0813   MCHC 32.2 06/04/2019 1050   RDW 13.1 06/11/2021 0813   RDW 14.2 04/29/2017 0944   LYMPHSABS 1.0 06/11/2021 0813   LYMPHSABS 1.7 04/29/2017 0944   MONOABS 0.6 04/29/2017 0944   EOSABS 0.1 06/11/2021 0813   BASOSABS 0.1 06/11/2021 0813   BASOSABS 0.0 04/29/2017 0944    CMP     Component Value Date/Time   NA 131 (L) 06/11/2021 0813   NA 139 04/29/2017 0944   K 5.1 06/11/2021 0813   K 3.9 04/29/2017 0944   CL 92 (L) 06/11/2021 0813   CO2 24 06/11/2021 0813   CO2 29 04/29/2017 0944   GLUCOSE 72  06/11/2021 0813   GLUCOSE 132 (H) 06/04/2019 1050   GLUCOSE  120 04/29/2017 0944   BUN 16 06/11/2021 0813   BUN 15.8 04/29/2017 0944   CREATININE 0.94 06/11/2021 0813   CREATININE 0.8 04/29/2017 0944   CALCIUM 9.3 06/11/2021 0813   CALCIUM 9.2 04/29/2017 0944   PROT 6.1 06/11/2021 0813   PROT 6.8 04/29/2017 0944   ALBUMIN 4.2 06/11/2021 0813   ALBUMIN 3.4 (L) 04/29/2017 0944   AST 20 06/11/2021 0813   AST 17 04/29/2017 0944   ALT 16 06/11/2021 0813   ALT 14 04/29/2017 0944   ALKPHOS 65 06/11/2021 0813   ALKPHOS 116 04/29/2017 0944   BILITOT 0.3 06/11/2021 0813   BILITOT 0.41 04/29/2017 0944   GFRNONAA 84 06/27/2020 0841   GFRAA 97 06/27/2020 0841     ASSESSMENT AND PLAN: 70 year old female with history of provoked DVT and 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  She has occasional constipation, but no other chronic GI symptoms.  The patient does not have a strong indication for Xarelto, but has continued on it because it has stopped her recurrent TIA symptoms.  She stopped the Xarelto for her recent nephrectomy without problem.  We will plan to hold her Xarelto for 2 days prior to her colonoscopy.  For her constipation, recommended she just take Metamucil on a daily basis and continue to use Dulcolax on a as needed basis.  Colon cancer screening - Scheduled for routine screening colonoscopy - Will hold Xarelto 2 days prior to endoscopic procedures - will instruct when and how to resume after procedure. Benefits and risks of procedure explained including risks of bleeding, perforation, infection, missed lesions, reactions to medications and possible need for hospitalization and surgery for complications. Additional rare but real risk of stroke or other vascular clotting events off Xarelto also explained and need to seek urgent help if any signs of these problems occur. Will communicate by phone or EMR with patient's  prescribing provider to confirm that holding Xarelto is reasonable in this case.   The details,  risks (including bleeding, perforation, infection, missed lesions, medication reactions and possible hospitalization or surgery if complications occur), benefits, and alternatives to colonoscopy with possible biopsy and possible polypectomy were discussed with the patient and she consents to proceed.   Aayat Hajjar E. Candis Schatz, MD [facility]   CC: Rankins, Bill Salinas, MD

## 2021-07-23 NOTE — Patient Instructions (Signed)
If you are age 70 or older, your body mass index should be between 23-30. Your Body mass index is 29.94 kg/m. If this is out of the aforementioned range listed, please consider follow up with your Primary Care Provider.  If you are age 80 or younger, your body mass index should be between 19-25. Your Body mass index is 29.94 kg/m. If this is out of the aformentioned range listed, please consider follow up with your Primary Care Provider.   You have been scheduled for a colonoscopy. Please follow written instructions given to you at your visit today.  Please pick up your prep supplies at the pharmacy within the next 1-3 days. If you use inhalers (even only as needed), please bring them with you on the day of your procedure.  The [facility] providers would like to encourage you to use [facility] to communicate with providers for non-urgent requests or questions.  Due to long hold times on the telephone, sending your provider a message by [facility] may be a faster and more efficient way to get a response.  Please allow 48 business hours for a   Due to recent changes in healthcare laws, you may see the results of your imaging and laboratory studies on MyChart before your provider has had a chance to review them.  We understand that in some cases there may be results that are confusing or concerning to you. Not all laboratory results come back in the same time frame and the provider may be waiting for multiple results in order to interpret others.  Please give [place] 48 hours in order for your provider to thoroughly review all the results before contacting the office for clarification of your results.   It was a pleasure to see you today!  Thank you for trusting me with your gastrointestinal care!    Scott E. Candis Schatz, MD

## 2021-07-23 NOTE — Telephone Encounter (Signed)
   [name] [name] 1951-05-25 [id number]  Dear Dr.Ahern:  We have scheduled the above named patient for a(n) Colonoscopy procedure. Our records show that (s)he is on anticoagulation therapy.  Please advise as to whether the patient may come off their therapy of Xarelto 2 days prior to their procedure which is scheduled for 09/05/21.  Please route your response to [facility] or fax response to [phone].  Sincerely,    [facility]

## 2021-07-24 ENCOUNTER — Ambulatory Visit (INDEPENDENT_AMBULATORY_CARE_PROVIDER_SITE_OTHER): Payer: Medicare Other | Admitting: Family Medicine

## 2021-07-24 DIAGNOSIS — M7918 Myalgia, other site: Secondary | ICD-10-CM | POA: Diagnosis not present

## 2021-07-24 DIAGNOSIS — M9903 Segmental and somatic dysfunction of lumbar region: Secondary | ICD-10-CM | POA: Diagnosis not present

## 2021-07-24 DIAGNOSIS — M9904 Segmental and somatic dysfunction of sacral region: Secondary | ICD-10-CM | POA: Diagnosis not present

## 2021-07-24 DIAGNOSIS — M25552 Pain in left hip: Secondary | ICD-10-CM | POA: Diagnosis not present

## 2021-07-24 DIAGNOSIS — M9905 Segmental and somatic dysfunction of pelvic region: Secondary | ICD-10-CM | POA: Diagnosis not present

## 2021-07-24 DIAGNOSIS — M545 Low back pain, unspecified: Secondary | ICD-10-CM | POA: Diagnosis not present

## 2021-07-24 NOTE — Telephone Encounter (Signed)
Called patient and informed her that is okay to hold Xarelto 2 days prior to her procedure. Patient stated understanding and had no questions at the end of call.

## 2021-07-24 NOTE — Telephone Encounter (Signed)
Received [employer] FEP [place] form for Qutenza.  Completed and fax confirmation received [phone].  Determination 3-5 days.

## 2021-07-28 NOTE — Telephone Encounter (Signed)
Received approval for Qutenza patch 06-24-21 thru 10-22-2021 [employer] FEP. [id number].

## 2021-08-04 DIAGNOSIS — M25562 Pain in left knee: Secondary | ICD-10-CM | POA: Diagnosis not present

## 2021-08-04 DIAGNOSIS — M25561 Pain in right knee: Secondary | ICD-10-CM | POA: Diagnosis not present

## 2021-08-04 DIAGNOSIS — Z4789 Encounter for other orthopedic aftercare: Secondary | ICD-10-CM | POA: Diagnosis not present

## 2021-08-04 DIAGNOSIS — E1142 Type 2 diabetes mellitus with diabetic polyneuropathy: Secondary | ICD-10-CM | POA: Diagnosis not present

## 2021-08-04 DIAGNOSIS — M25551 Pain in right hip: Secondary | ICD-10-CM | POA: Diagnosis not present

## 2021-08-05 DIAGNOSIS — M9905 Segmental and somatic dysfunction of pelvic region: Secondary | ICD-10-CM | POA: Diagnosis not present

## 2021-08-05 DIAGNOSIS — E1140 Type 2 diabetes mellitus with diabetic neuropathy, unspecified: Secondary | ICD-10-CM | POA: Diagnosis not present

## 2021-08-05 DIAGNOSIS — M7918 Myalgia, other site: Secondary | ICD-10-CM | POA: Diagnosis not present

## 2021-08-05 DIAGNOSIS — M9903 Segmental and somatic dysfunction of lumbar region: Secondary | ICD-10-CM | POA: Diagnosis not present

## 2021-08-05 DIAGNOSIS — Z9181 History of falling: Secondary | ICD-10-CM | POA: Diagnosis not present

## 2021-08-05 DIAGNOSIS — M545 Low back pain, unspecified: Secondary | ICD-10-CM | POA: Diagnosis not present

## 2021-08-05 DIAGNOSIS — M9904 Segmental and somatic dysfunction of sacral region: Secondary | ICD-10-CM | POA: Diagnosis not present

## 2021-08-05 DIAGNOSIS — M25552 Pain in left hip: Secondary | ICD-10-CM | POA: Diagnosis not present

## 2021-08-07 ENCOUNTER — Other Ambulatory Visit (INDEPENDENT_AMBULATORY_CARE_PROVIDER_SITE_OTHER): Payer: Self-pay | Admitting: Family Medicine

## 2021-08-07 DIAGNOSIS — E1159 Type 2 diabetes mellitus with other circulatory complications: Secondary | ICD-10-CM

## 2021-08-11 NOTE — Telephone Encounter (Signed)
Dawn

## 2021-08-12 DIAGNOSIS — Z96641 Presence of right artificial hip joint: Secondary | ICD-10-CM | POA: Diagnosis not present

## 2021-08-12 DIAGNOSIS — R531 Weakness: Secondary | ICD-10-CM | POA: Diagnosis not present

## 2021-08-12 DIAGNOSIS — E1140 Type 2 diabetes mellitus with diabetic neuropathy, unspecified: Secondary | ICD-10-CM | POA: Diagnosis not present

## 2021-08-12 DIAGNOSIS — Z471 Aftercare following joint replacement surgery: Secondary | ICD-10-CM | POA: Diagnosis not present

## 2021-08-12 DIAGNOSIS — Z96653 Presence of artificial knee joint, bilateral: Secondary | ICD-10-CM | POA: Diagnosis not present

## 2021-08-13 DIAGNOSIS — M545 Low back pain, unspecified: Secondary | ICD-10-CM | POA: Diagnosis not present

## 2021-08-13 DIAGNOSIS — M9905 Segmental and somatic dysfunction of pelvic region: Secondary | ICD-10-CM | POA: Diagnosis not present

## 2021-08-13 DIAGNOSIS — M25552 Pain in left hip: Secondary | ICD-10-CM | POA: Diagnosis not present

## 2021-08-13 DIAGNOSIS — M9903 Segmental and somatic dysfunction of lumbar region: Secondary | ICD-10-CM | POA: Diagnosis not present

## 2021-08-13 DIAGNOSIS — M7918 Myalgia, other site: Secondary | ICD-10-CM | POA: Diagnosis not present

## 2021-08-13 DIAGNOSIS — M9904 Segmental and somatic dysfunction of sacral region: Secondary | ICD-10-CM | POA: Diagnosis not present

## 2021-08-20 DIAGNOSIS — Z471 Aftercare following joint replacement surgery: Secondary | ICD-10-CM | POA: Diagnosis not present

## 2021-08-20 DIAGNOSIS — Z96641 Presence of right artificial hip joint: Secondary | ICD-10-CM | POA: Diagnosis not present

## 2021-08-20 DIAGNOSIS — Z96653 Presence of artificial knee joint, bilateral: Secondary | ICD-10-CM | POA: Diagnosis not present

## 2021-08-20 DIAGNOSIS — R531 Weakness: Secondary | ICD-10-CM | POA: Diagnosis not present

## 2021-08-20 DIAGNOSIS — E1140 Type 2 diabetes mellitus with diabetic neuropathy, unspecified: Secondary | ICD-10-CM | POA: Diagnosis not present

## 2021-08-22 DIAGNOSIS — M9903 Segmental and somatic dysfunction of lumbar region: Secondary | ICD-10-CM | POA: Diagnosis not present

## 2021-08-22 DIAGNOSIS — M9904 Segmental and somatic dysfunction of sacral region: Secondary | ICD-10-CM | POA: Diagnosis not present

## 2021-08-22 DIAGNOSIS — M25552 Pain in left hip: Secondary | ICD-10-CM | POA: Diagnosis not present

## 2021-08-22 DIAGNOSIS — M545 Low back pain, unspecified: Secondary | ICD-10-CM | POA: Diagnosis not present

## 2021-08-22 DIAGNOSIS — M7918 Myalgia, other site: Secondary | ICD-10-CM | POA: Diagnosis not present

## 2021-08-22 DIAGNOSIS — M9905 Segmental and somatic dysfunction of pelvic region: Secondary | ICD-10-CM | POA: Diagnosis not present

## 2021-08-25 DIAGNOSIS — Z96653 Presence of artificial knee joint, bilateral: Secondary | ICD-10-CM | POA: Diagnosis not present

## 2021-08-25 DIAGNOSIS — E1142 Type 2 diabetes mellitus with diabetic polyneuropathy: Secondary | ICD-10-CM | POA: Diagnosis not present

## 2021-08-25 DIAGNOSIS — Z96641 Presence of right artificial hip joint: Secondary | ICD-10-CM | POA: Diagnosis not present

## 2021-08-25 DIAGNOSIS — R2689 Other abnormalities of gait and mobility: Secondary | ICD-10-CM | POA: Diagnosis not present

## 2021-08-25 DIAGNOSIS — G4700 Insomnia, unspecified: Secondary | ICD-10-CM | POA: Diagnosis not present

## 2021-08-25 DIAGNOSIS — M6281 Muscle weakness (generalized): Secondary | ICD-10-CM | POA: Diagnosis not present

## 2021-08-27 DIAGNOSIS — G4700 Insomnia, unspecified: Secondary | ICD-10-CM | POA: Diagnosis not present

## 2021-08-27 DIAGNOSIS — M6281 Muscle weakness (generalized): Secondary | ICD-10-CM | POA: Diagnosis not present

## 2021-08-27 DIAGNOSIS — Z96653 Presence of artificial knee joint, bilateral: Secondary | ICD-10-CM | POA: Diagnosis not present

## 2021-08-27 DIAGNOSIS — R2689 Other abnormalities of gait and mobility: Secondary | ICD-10-CM | POA: Diagnosis not present

## 2021-08-27 DIAGNOSIS — E1142 Type 2 diabetes mellitus with diabetic polyneuropathy: Secondary | ICD-10-CM | POA: Diagnosis not present

## 2021-08-27 DIAGNOSIS — Z96641 Presence of right artificial hip joint: Secondary | ICD-10-CM | POA: Diagnosis not present

## 2021-08-28 DIAGNOSIS — M545 Low back pain, unspecified: Secondary | ICD-10-CM | POA: Diagnosis not present

## 2021-08-28 DIAGNOSIS — M9905 Segmental and somatic dysfunction of pelvic region: Secondary | ICD-10-CM | POA: Diagnosis not present

## 2021-08-28 DIAGNOSIS — M7918 Myalgia, other site: Secondary | ICD-10-CM | POA: Diagnosis not present

## 2021-08-28 DIAGNOSIS — M9903 Segmental and somatic dysfunction of lumbar region: Secondary | ICD-10-CM | POA: Diagnosis not present

## 2021-08-28 DIAGNOSIS — M25552 Pain in left hip: Secondary | ICD-10-CM | POA: Diagnosis not present

## 2021-08-28 DIAGNOSIS — M9904 Segmental and somatic dysfunction of sacral region: Secondary | ICD-10-CM | POA: Diagnosis not present

## 2021-09-01 DIAGNOSIS — G4700 Insomnia, unspecified: Secondary | ICD-10-CM | POA: Diagnosis not present

## 2021-09-01 DIAGNOSIS — E1142 Type 2 diabetes mellitus with diabetic polyneuropathy: Secondary | ICD-10-CM | POA: Diagnosis not present

## 2021-09-01 DIAGNOSIS — Z96653 Presence of artificial knee joint, bilateral: Secondary | ICD-10-CM | POA: Diagnosis not present

## 2021-09-01 DIAGNOSIS — M6281 Muscle weakness (generalized): Secondary | ICD-10-CM | POA: Diagnosis not present

## 2021-09-01 DIAGNOSIS — Z96641 Presence of right artificial hip joint: Secondary | ICD-10-CM | POA: Diagnosis not present

## 2021-09-01 DIAGNOSIS — R2689 Other abnormalities of gait and mobility: Secondary | ICD-10-CM | POA: Diagnosis not present

## 2021-09-03 DIAGNOSIS — G4700 Insomnia, unspecified: Secondary | ICD-10-CM | POA: Diagnosis not present

## 2021-09-03 DIAGNOSIS — Z96641 Presence of right artificial hip joint: Secondary | ICD-10-CM | POA: Diagnosis not present

## 2021-09-03 DIAGNOSIS — Z96653 Presence of artificial knee joint, bilateral: Secondary | ICD-10-CM | POA: Diagnosis not present

## 2021-09-03 DIAGNOSIS — M6281 Muscle weakness (generalized): Secondary | ICD-10-CM | POA: Diagnosis not present

## 2021-09-03 DIAGNOSIS — E1142 Type 2 diabetes mellitus with diabetic polyneuropathy: Secondary | ICD-10-CM | POA: Diagnosis not present

## 2021-09-03 DIAGNOSIS — R2689 Other abnormalities of gait and mobility: Secondary | ICD-10-CM | POA: Diagnosis not present

## 2021-09-05 ENCOUNTER — Other Ambulatory Visit: Payer: Self-pay

## 2021-09-05 ENCOUNTER — Encounter: Payer: Self-pay | Admitting: Gastroenterology

## 2021-09-05 ENCOUNTER — Ambulatory Visit (AMBULATORY_SURGERY_CENTER): Payer: Medicare Other | Admitting: Gastroenterology

## 2021-09-05 VITALS — BP 157/76 | HR 69 | Temp 97.8°F | Resp 13 | Ht 63.0 in | Wt 169.0 lb

## 2021-09-05 DIAGNOSIS — Z1212 Encounter for screening for malignant neoplasm of rectum: Secondary | ICD-10-CM | POA: Diagnosis not present

## 2021-09-05 DIAGNOSIS — D123 Benign neoplasm of transverse colon: Secondary | ICD-10-CM

## 2021-09-05 DIAGNOSIS — Z1211 Encounter for screening for malignant neoplasm of colon: Secondary | ICD-10-CM

## 2021-09-05 MED ORDER — SODIUM CHLORIDE 0.9 % IV SOLN: 500.0000 mL | Freq: Once | INTRAVENOUS | Status: DC

## 2021-09-05 NOTE — Progress Notes (Signed)
[facility] History and Physical   Primary Care Physician:  [name], MD   Reason for Procedure:   Colon cancer screening  Plan:    Screening colonoscopy     HPI: [name] [name] is a 70 y.o. female undergoing average risk screening colonoscopy.  She has no family history of colon cancer and has chronic constipation, but otherwise no chronic GI symptoms.  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   Past Medical History:  Diagnosis Date   Anemia    Arthritis    "knees, hips, possibly back" (04/08/2016)   Atrial septal aneurysm    B12 deficiency    a. following gastric bypass.   Back pain    Cancer of kidney (HCC)    Cataract    Chronic lower back pain    spondylosis   Cold feet    Daily headache    "short ones for the last month or 2" (04/08/2016)   Depression    Diabetes (HCC)    Dry mouth    DVT (deep venous thrombosis) (HCC)    Easy bruising    Floaters in visual field    GERD (gastroesophageal reflux disease)    takes Protonix daily "just to protect my stomach; not for reflux" (04/08/2016)   Hay fever    Heart murmur    "dx'd by 1 anesthesiologist years and years ago"   History of loop recorder 03/2016   Hyperlipidemia    Hypertension    Joint pain    Leg cramp    Macular degeneration    "just watching"   Neuropathy    Nosebleed    Palpitations    Peripheral edema    Peripheral neuropathy    not on any meds   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 s/p endoscopic surgery 02/2016.   Pneumonia 12/2017   Sleep apnea    no CPAP since weight loss after bariatric surgery '12 (04/08/2016)   Spondylosis    Stroke ([place]) 04/07/2016   a. 01/2016 and 03/2016. during admission had + LE DVT.   Swelling of extremity    TIA (transient ischemic attack)    Trouble in sleeping    Urinary incontinence    Weakness     Past Surgical History:  Procedure Laterality Date   ABDOMINOPLASTY     BRACHIOPLASTY     and more  tissue removed during a second surgery   CATARACT EXTRACTION W/ INTRAOCULAR LENS  IMPLANT, BILATERAL Bilateral ~ 2012   COLONOSCOPY     EP IMPLANTABLE DEVICE N/A 04/10/2016   Procedure: Loop Recorder Insertion;  Surgeon: Will Meredith Leeds, MD;  Location: [facility];  Service: Cardiovascular;  Laterality: N/A;   ESOPHAGOGASTRODUODENOSCOPY     in the 70's   FACIAL COSMETIC SURGERY     JOINT REPLACEMENT     MASTOPEXY     NEPHRECTOMY  03/2021   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   SINUS ENDO W/FUSION  02/28/2016   Procedure: ENDOSCOPIC SINUS SURGERY WITH NAVIGATION;  Surgeon: Ruby Cola, MD;  Location: [place];  Service: ENT;;   TEE WITHOUT CARDIOVERSION N/A 04/10/2016   Procedure: TRANSESOPHAGEAL ECHOCARDIOGRAM (TEE) POSSIBLE LOOP;  Surgeon: Sanda Klein, MD;  Location: [place];  Service: Cardiovascular;  Laterality: N/A;   [place] Right 2008   TOTAL HIP REVISION Right 03/30/2016   Procedure: RIGHT TOTAL HIP REVISION;  Surgeon: Frederik Pear, MD;  Location:  [place] [facility];  Service: Orthopedics;  Laterality: Right;   TOTAL KNEE ARTHROPLASTY Bilateral 2001   VAGINAL HYSTERECTOMY     "left my ovaries"    Prior to Admission medications   Medication Sig Start Date End Date Taking? Authorizing Provider  Cholecalciferol (VITAMIN D3) 250 MCG (10000 UT) capsule Take 1 capsule (10,000 Units total) by mouth daily. 04/18/20  Yes Whitmire, Joneen Boers, FNP  gabapentin (NEURONTIN) 300 MG capsule Take by mouth. 07/20/21  Yes [provider]  hydrochlorothiazide (HYDRODIURIL) 25 MG tablet Take by mouth. 07/03/21  Yes [provider]  losartan (COZAAR) 100 MG tablet Take 100 mg by mouth daily. 07/03/21  Yes [provider]  magnesium oxide (MAG-OX) 400 MG tablet Take 400 mg by mouth at bedtime.    Yes [provider]  Multiple Vitamins-Minerals (OCUVITE-LUTEIN PO) Take 1 tablet by mouth daily.   Yes [provider]   rosuvastatin (CRESTOR) 20 MG tablet Take 20 mg by mouth at bedtime. 07/03/21  Yes [provider]  capsicum (ZOSTRIX) 0.075 % topical cream Apply 1 application topically 3 (three) times daily. 05/12/21   Melvenia Beam, MD  diazepam (VALIUM) 2 MG tablet TAKE 1 TABLET BY MOUTH TWICE A DAY AS NEEDED FOR VERTIGO 06/06/19   [provider]  lidocaine-prilocaine (EMLA) cream Apply 1 application topically as needed. Patient not taking: Reported on 09/05/2021 05/12/21   Melvenia Beam, MD  predniSONE (STERAPRED UNI-PAK 21 TAB) 5 MG (21) TBPK tablet Take 5 mg by mouth daily.    [provider]  XARELTO 20 MG TABS tablet Take 1 tablet (20 mg total) by mouth daily with supper. 05/08/21   Melvenia Beam, MD    Current Outpatient Medications  Medication Sig Dispense Refill   Cholecalciferol (VITAMIN D3) 250 MCG (10000 UT) capsule Take 1 capsule (10,000 Units total) by mouth daily. 30 capsule 0   gabapentin (NEURONTIN) 300 MG capsule Take by mouth.     hydrochlorothiazide (HYDRODIURIL) 25 MG tablet Take by mouth.     losartan (COZAAR) 100 MG tablet Take 100 mg by mouth daily.     magnesium oxide (MAG-OX) 400 MG tablet Take 400 mg by mouth at bedtime.      Multiple Vitamins-Minerals (OCUVITE-LUTEIN PO) Take 1 tablet by mouth daily.     rosuvastatin (CRESTOR) 20 MG tablet Take 20 mg by mouth at bedtime.     capsicum (ZOSTRIX) 0.075 % topical cream Apply 1 application topically 3 (three) times daily. 5 g 3   diazepam (VALIUM) 2 MG tablet TAKE 1 TABLET BY MOUTH TWICE A DAY AS NEEDED FOR VERTIGO     lidocaine-prilocaine (EMLA) cream Apply 1 application topically as needed. (Patient not taking: Reported on 09/05/2021) 30 g 3   predniSONE (STERAPRED UNI-PAK 21 TAB) 5 MG (21) TBPK tablet Take 5 mg by mouth daily.     XARELTO 20 MG TABS tablet Take 1 tablet (20 mg total) by mouth daily with supper. 90 tablet 3   Current Facility-Administered Medications  Medication Dose Route  Frequency Provider Last Rate Last Admin   0.9 %  sodium chloride infusion  500 mL Intravenous Once Daryel November, MD        Allergies as of 09/05/2021 - Review Complete 09/05/2021  Allergen Reaction Noted   Ambien [zolpidem] Other (See Comments) 03/27/2016   Atorvastatin Other (See Comments) 02/24/2016   Codeine Nausea And Vomiting 05/29/2011   Penicillins Itching and Other (See Comments) 05/29/2011   Simvastatin Other (See  Comments) 03/27/2016   Meclizine  07/06/2019   Meclizine hcl  11/18/2020   Other Other (See Comments) 11/18/2020   Byetta 10 mcg pen [exenatide] Nausea Only 03/27/2016    Family History  Problem Relation Age of Onset   Neuropathy Mother    Hypertension Mother    Hyperlipidemia Mother    Thyroid disease Mother    Prostate cancer Father    Lymphoma Father    Hypertension Father    Hyperlipidemia Father    Cancer Father    Obesity Father    Brain cancer Father    Congestive Heart Failure Maternal Grandmother    Lung cancer Maternal Grandfather    Stomach cancer Neg Hx    Colon cancer Neg Hx    Esophageal cancer Neg Hx    Pancreatic cancer Neg Hx     Social History   Socioeconomic History   Marital status: Single    Spouse name: Not on file   Number of children: 0   Years of education: college   Highest education level: Not on file  Occupational History   Occupation: Retired  Tobacco Use   Smoking status: Former    Packs/day: 1.00    Years: 40.00    Pack years: 40.00    Types: Cigarettes    Quit date: 08/2006    Years since quitting: 15.0   Smokeless tobacco: Never  Vaping Use   Vaping Use: Never used  Substance and Sexual Activity   Alcohol use: Yes    Alcohol/week: 2.0 standard drinks    Types: 2 Glasses of wine per week    Comment: 07/06/2019 maybe 2 glasses of wine/week; 04/08/2016 "5-6 drinks/year'   Drug use: No    Types: Marijuana    Comment: 04/08/2016 "did some marijuana in college"   Sexual activity: Never    Birth  control/protection: Surgical  Other Topics Concern   Not on file  Social History Narrative   Lives at home alone   Right-handed   Drinks 4 cups of coffee a day    Social Determinants of Health   Financial Resource Strain: Not on file  Food Insecurity: Not on file  Transportation Needs: Not on file  Physical Activity: Not on file  Stress: Not on file  Social Connections: Not on file  Intimate Partner Violence: Not on file    Review of Systems:  All other review of systems negative except as mentioned in the HPI.  Physical Exam: Vital signs BP (!) 159/86    Pulse 71    Temp 97.8 F (36.6 C)    Ht 5' 3" (1.6 m)    Wt 169 lb (76.7 kg)    SpO2 100%    BMI 29.94 kg/m   General:   Alert,  Well-developed, well-nourished, pleasant and cooperative in NAD Airway:  Mallampati 1 Lungs:  Clear throughout to auscultation.   Heart:  Regular rate and rhythm; no murmurs, clicks, rubs,  or gallops. Abdomen:  Soft, nontender and nondistended. Normal bowel sounds.   Neuro/Psych:  Normal mood and affect. A and O x 3   Najah Liverman E. Candis Schatz, MD [facility]

## 2021-09-05 NOTE — Op Note (Signed)
[place] Patient Name: [name] [name] Procedure Date: 09/05/2021 8:19 AM MRN: [id number] Endoscopist: Nicki Reaper E. Candis Schatz , MD Age: 70 Referring MD:  Date of Birth: 11-13-1950 Gender: Female Account #: [id number] Procedure:                Colonoscopy Indications:              Screening for colorectal malignant neoplasm Medicines:                Monitored Anesthesia Care Procedure:                Pre-Anesthesia Assessment:                           - Prior to the procedure, a History and Physical                            was performed, and patient medications and                            allergies were reviewed. The patient's tolerance of                            previous anesthesia was also reviewed. The risks                            and benefits of the procedure and the sedation                            options and risks were discussed with the patient.                            All questions were answered, and informed consent                            was obtained. Prior Anticoagulants: The patient has                            taken Xarelto (rivaroxaban), last dose was 2 days                            prior to procedure. ASA Grade Assessment: III - A                            patient with severe systemic disease. After                            reviewing the risks and benefits, the patient was                            deemed in satisfactory condition to undergo the                            procedure.  After obtaining informed consent, the colonoscope                            was passed under direct vision. Throughout the                            procedure, the patient's blood pressure, pulse, and                            oxygen saturations were monitored continuously. The                            CF [id number] #[id number] was introduced through the anus                            and advanced to the the cecum, identified  by                            appendiceal orifice and ileocecal valve. The                            colonoscopy was somewhat difficult due to a                            redundant colon and significant looping. Successful                            completion of the procedure was aided by using                            manual pressure. The patient tolerated the                            procedure well. The quality of the bowel                            preparation was adequate. The ileocecal valve,                            appendiceal orifice, and rectum were photographed. Scope In: 8:35:38 AM Scope Out: 9:01:29 AM Scope Withdrawal Time: 0 hours 19 minutes 7 seconds  Total Procedure Duration: 0 hours 25 minutes 51 seconds  Findings:                 The perianal and digital rectal examinations were                            normal. Pertinent negatives include normal                            sphincter tone and no palpable rectal lesions.                           A 4 mm polyp was found in the transverse colon.  The                            polyp was sessile. The polyp was removed with a                            cold snare. Resection and retrieval were complete.                            Estimated blood loss was minimal.                           There was a medium-sized lipoma, in the transverse                            colon.                           The exam was otherwise normal throughout the                            examined colon.                           The retroflexed view of the distal rectum and anal                            verge was normal and showed no anal or rectal                            abnormalities. Complications:            No immediate complications. Estimated Blood Loss:     Estimated blood loss was minimal. Impression:               - One 4 mm polyp in the transverse colon, removed                            with a cold snare. Resected and  retrieved.                           - Medium-sized lipoma in the transverse colon.                           - The distal rectum and anal verge are normal on                            retroflexion view. Recommendation:           - Patient has a contact number available for                            emergencies. The signs and symptoms of potential                            delayed complications were discussed with the  patient. Return to normal activities tomorrow.                            Written discharge instructions were provided to the                            patient.                           - Resume previous diet.                           - Continue present medications.                           - Resume Xarelto (rivaroxaban) at prior dose                            tomorrow.                           - Await pathology results.                           - Repeat colonoscopy (date not yet determined) for                            surveillance based on pathology results. Elaina Cara E. Candis Schatz, MD 09/05/2021 9:07:59 AM This report has been signed electronically.

## 2021-09-05 NOTE — Patient Instructions (Signed)
Discharge instructions given. Handout on polyps. Resume Xarelto at prior dose tomorrow. YOU HAD AN ENDOSCOPIC PROCEDURE TODAY AT [facility]:   Refer to the procedure report that was given to you for any specific questions about what was found during the examination.  If the procedure report does not answer your questions, please call your gastroenterologist to clarify.  If you requested that your care partner not be given the details of your procedure findings, then the procedure report has been included in a sealed envelope for you to review at your convenience later.  YOU SHOULD EXPECT: Some feelings of bloating in the abdomen. Passage of more gas than usual.  Walking can help get rid of the air that was put into your GI tract during the procedure and reduce the bloating. If you had a lower endoscopy (such as a colonoscopy or flexible sigmoidoscopy) you may notice spotting of blood in your stool or on the toilet paper. If you underwent a bowel prep for your procedure, you may not have a normal bowel movement for a few days.  Please Note:  You might notice some irritation and congestion in your nose or some drainage.  This is from the oxygen used during your procedure.  There is no need for concern and it should clear up in a day or so.  SYMPTOMS TO REPORT IMMEDIATELY:  Following lower endoscopy (colonoscopy or flexible sigmoidoscopy):  Excessive amounts of blood in the stool  Significant tenderness or worsening of abdominal pains  Swelling of the abdomen that is new, acute  Fever of 100F or higher   For urgent or emergent issues, a gastroenterologist can be reached at any hour by calling [phone]. Do not use MyChart messaging for urgent concerns.    DIET:  We do recommend a small meal at first, but then you may proceed to your regular diet.  Drink plenty of fluids but you should avoid alcoholic beverages for 24 hours.  ACTIVITY:  You should plan to take it easy for  the rest of today and you should NOT DRIVE or use heavy machinery until tomorrow (because of the sedation medicines used during the test).    FOLLOW UP: Our staff will call the number listed on your records 48-72 hours following your procedure to check on you and address any questions or concerns that you may have regarding the information given to you following your procedure. If we do not reach you, we will leave a message.  We will attempt to reach you two times.  During this call, we will ask if you have developed any symptoms of COVID 19. If you develop any symptoms (ie: fever, flu-like symptoms, shortness of breath, cough etc.) before then, please call [phone].  If you test positive for Covid 19 in the 2 weeks post procedure, please call and report this information to [place].    If any biopsies were taken you will be contacted by phone or by letter within the next 1-3 weeks.  Please call us at [phone] if you have not heard about the biopsies in 3 weeks.    SIGNATURES/CONFIDENTIALITY: You and/or your care partner have signed paperwork which will be entered into your electronic medical record.  These signatures attest to the fact that that the information above on your After Visit Summary has been reviewed and is understood.  Full responsibility of the confidentiality of this discharge information lies with you and/or your care-partner.

## 2021-09-05 NOTE — Progress Notes (Signed)
[facility] History and Physical   Primary Care Physician:  [name], MD   Reason for Procedure:   Colon cancer screening  Plan:    Screening colonoscopy     HPI: [name] [name] is a 70 y.o. female undergoing average risk screening colonoscopy.  She has no family history of colon cancer and no chronic GI symptoms other than occasional constipation.  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  She had a colonoscopy in 2012 which was unremarkable per patient.   Past Medical History:  Diagnosis Date   Anemia    Arthritis    "knees, hips, possibly back" (04/08/2016)   Atrial septal aneurysm    B12 deficiency    a. following gastric bypass.   Back pain    Cancer of kidney (HCC)    Cataract    Chronic lower back pain    spondylosis   Cold feet    Daily headache    "short ones for the last month or 2" (04/08/2016)   Depression    Diabetes (HCC)    Dry mouth    DVT (deep venous thrombosis) (HCC)    Easy bruising    Floaters in visual field    GERD (gastroesophageal reflux disease)    takes Protonix daily "just to protect my stomach; not for reflux" (04/08/2016)   Hay fever    Heart murmur    "dx'd by 1 anesthesiologist years and years ago"   History of loop recorder 03/2016   Hyperlipidemia    Hypertension    Joint pain    Leg cramp    Macular degeneration    "just watching"   Neuropathy    Nosebleed    Palpitations    Peripheral edema    Peripheral neuropathy    not on any meds   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   Pneumonia 12/2017   Sleep apnea    no CPAP since weight loss after bariatric surgery '12 (04/08/2016)   Spondylosis    Stroke ([place]) 04/07/2016   a. 01/2016 and 03/2016. during admission had + LE DVT.   Swelling of extremity    TIA (transient ischemic attack)    Trouble in sleeping    Urinary incontinence    Weakness     Past Surgical History:  Procedure Laterality  Date   ABDOMINOPLASTY     BRACHIOPLASTY     and more tissue removed during a second surgery   CATARACT EXTRACTION W/ INTRAOCULAR LENS  IMPLANT, BILATERAL Bilateral ~ 2012   COLONOSCOPY     EP IMPLANTABLE DEVICE N/A 04/10/2016   Procedure: Loop Recorder Insertion;  Surgeon: Will Meredith Leeds, MD;  Location: [facility];  Service: Cardiovascular;  Laterality: N/A;   ESOPHAGOGASTRODUODENOSCOPY     in the 70's   FACIAL COSMETIC SURGERY     JOINT REPLACEMENT     MASTOPEXY     NEPHRECTOMY  03/2021   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   SINUS ENDO W/FUSION  02/28/2016   Procedure: ENDOSCOPIC SINUS SURGERY WITH NAVIGATION;  Surgeon: Ruby Cola, MD;  Location: [facility];  Service: ENT;;   TEE WITHOUT CARDIOVERSION N/A 04/10/2016   Procedure: TRANSESOPHAGEAL ECHOCARDIOGRAM (TEE) POSSIBLE LOOP;  Surgeon: Sanda Klein, MD;  Location: [facility];  Service: Cardiovascular;  Laterality: N/A;   [place] Right 2008   TOTAL HIP REVISION Right 03/30/2016   Procedure:  RIGHT TOTAL HIP REVISION;  Surgeon: Frederik Pear, MD;  Location: [facility];  Service: Orthopedics;  Laterality: Right;   TOTAL KNEE ARTHROPLASTY Bilateral 2001   VAGINAL HYSTERECTOMY     "left my ovaries"    Prior to Admission medications   Medication Sig Start Date End Date Taking? Authorizing Provider  Cholecalciferol (VITAMIN D3) 250 MCG (10000 UT) capsule Take 1 capsule (10,000 Units total) by mouth daily. 04/18/20  Yes Whitmire, Joneen Boers, FNP  gabapentin (NEURONTIN) 300 MG capsule Take by mouth. 07/20/21  Yes [provider]  hydrochlorothiazide (HYDRODIURIL) 25 MG tablet Take by mouth. 07/03/21  Yes [provider]  losartan (COZAAR) 100 MG tablet Take 100 mg by mouth daily. 07/03/21  Yes [provider]  magnesium oxide (MAG-OX) 400 MG tablet Take 400 mg by mouth at bedtime.    Yes [provider]  Multiple Vitamins-Minerals (OCUVITE-LUTEIN PO) Take 1  tablet by mouth daily.   Yes [provider]  rosuvastatin (CRESTOR) 20 MG tablet Take 20 mg by mouth at bedtime. 07/03/21  Yes [provider]  capsicum (ZOSTRIX) 0.075 % topical cream Apply 1 application topically 3 (three) times daily. 05/12/21   Melvenia Beam, MD  diazepam (VALIUM) 2 MG tablet TAKE 1 TABLET BY MOUTH TWICE A DAY AS NEEDED FOR VERTIGO 06/06/19   [provider]  lidocaine-prilocaine (EMLA) cream Apply 1 application topically as needed. Patient not taking: Reported on 09/05/2021 05/12/21   Melvenia Beam, MD  predniSONE (STERAPRED UNI-PAK 21 TAB) 5 MG (21) TBPK tablet Take 5 mg by mouth daily.    [provider]  XARELTO 20 MG TABS tablet Take 1 tablet (20 mg total) by mouth daily with supper. 05/08/21   Melvenia Beam, MD    Current Outpatient Medications  Medication Sig Dispense Refill   Cholecalciferol (VITAMIN D3) 250 MCG (10000 UT) capsule Take 1 capsule (10,000 Units total) by mouth daily. 30 capsule 0   gabapentin (NEURONTIN) 300 MG capsule Take by mouth.     hydrochlorothiazide (HYDRODIURIL) 25 MG tablet Take by mouth.     losartan (COZAAR) 100 MG tablet Take 100 mg by mouth daily.     magnesium oxide (MAG-OX) 400 MG tablet Take 400 mg by mouth at bedtime.      Multiple Vitamins-Minerals (OCUVITE-LUTEIN PO) Take 1 tablet by mouth daily.     rosuvastatin (CRESTOR) 20 MG tablet Take 20 mg by mouth at bedtime.     capsicum (ZOSTRIX) 0.075 % topical cream Apply 1 application topically 3 (three) times daily. 5 g 3   diazepam (VALIUM) 2 MG tablet TAKE 1 TABLET BY MOUTH TWICE A DAY AS NEEDED FOR VERTIGO     lidocaine-prilocaine (EMLA) cream Apply 1 application topically as needed. (Patient not taking: Reported on 09/05/2021) 30 g 3   predniSONE (STERAPRED UNI-PAK 21 TAB) 5 MG (21) TBPK tablet Take 5 mg by mouth daily.     XARELTO 20 MG TABS tablet Take 1 tablet (20 mg total) by mouth daily with supper. 90 tablet 3   Current  Facility-Administered Medications  Medication Dose Route Frequency Provider Last Rate Last Admin   0.9 %  sodium chloride infusion  500 mL Intravenous Once Daryel November, MD        Allergies as of 09/05/2021 - Review Complete 09/05/2021  Allergen Reaction Noted   Ambien [zolpidem] Other (See Comments) 03/27/2016   Atorvastatin Other (See Comments) 02/24/2016   Codeine Nausea And Vomiting 05/29/2011   Penicillins  Itching and Other (See Comments) 05/29/2011   Simvastatin Other (See Comments) 03/27/2016   Meclizine  07/06/2019   Meclizine hcl  11/18/2020   Other Other (See Comments) 11/18/2020   Byetta 10 mcg pen [exenatide] Nausea Only 03/27/2016    Family History  Problem Relation Age of Onset   Neuropathy Mother    Hypertension Mother    Hyperlipidemia Mother    Thyroid disease Mother    Prostate cancer Father    Lymphoma Father    Hypertension Father    Hyperlipidemia Father    Cancer Father    Obesity Father    Brain cancer Father    Congestive Heart Failure Maternal Grandmother    Lung cancer Maternal Grandfather    Stomach cancer Neg Hx    Colon cancer Neg Hx    Esophageal cancer Neg Hx    Pancreatic cancer Neg Hx     Social History   Socioeconomic History   Marital status: Single    Spouse name: Not on file   Number of children: 0   Years of education: college   Highest education level: Not on file  Occupational History   Occupation: Retired  Tobacco Use   Smoking status: Former    Packs/day: 1.00    Years: 40.00    Pack years: 40.00    Types: Cigarettes    Quit date: 08/2006    Years since quitting: 15.0   Smokeless tobacco: Never  Vaping Use   Vaping Use: Never used  Substance and Sexual Activity   Alcohol use: Yes    Alcohol/week: 2.0 standard drinks    Types: 2 Glasses of wine per week    Comment: 07/06/2019 maybe 2 glasses of wine/week; 04/08/2016 "5-6 drinks/year'   Drug use: No    Types: Marijuana    Comment: 04/08/2016 "did some  marijuana in college"   Sexual activity: Never    Birth control/protection: Surgical  Other Topics Concern   Not on file  Social History Narrative   Lives at home alone   Right-handed   Drinks 4 cups of coffee a day    Social Determinants of Health   Financial Resource Strain: Not on file  Food Insecurity: Not on file  Transportation Needs: Not on file  Physical Activity: Not on file  Stress: Not on file  Social Connections: Not on file  Intimate Partner Violence: Not on file    Review of Systems:  All other review of systems negative except as mentioned in the HPI.  Physical Exam: Vital signs BP (!) 159/86    Pulse 71    Temp 97.8 F (36.6 C)    Ht _0  (1.6 m)    Wt 169 lb (76.7 kg)    SpO2 100%    BMI 29.94 kg/m   General:   Alert,  Well-developed, well-nourished, pleasant and cooperative in NAD Airway:  Mallampati 2 Lungs:  Clear throughout to auscultation.   Heart:  Regular rate and rhythm; no murmurs, clicks, rubs,  or gallops. Abdomen:  Soft, nontender and nondistended. Normal bowel sounds.   Neuro/Psych:  Normal mood and affect. A and O x 3   Heike Pounds E. Candis Schatz, MD [facility]

## 2021-09-05 NOTE — Progress Notes (Signed)
VSS, transported to PACU

## 2021-09-05 NOTE — Progress Notes (Signed)
Called to room to assist during endoscopic procedure.  Patient ID and intended procedure confirmed with present staff. Received instructions for my participation in the procedure from the performing physician.

## 2021-09-08 DIAGNOSIS — Z96653 Presence of artificial knee joint, bilateral: Secondary | ICD-10-CM | POA: Diagnosis not present

## 2021-09-08 DIAGNOSIS — E1142 Type 2 diabetes mellitus with diabetic polyneuropathy: Secondary | ICD-10-CM | POA: Diagnosis not present

## 2021-09-08 DIAGNOSIS — M6281 Muscle weakness (generalized): Secondary | ICD-10-CM | POA: Diagnosis not present

## 2021-09-08 DIAGNOSIS — Z96641 Presence of right artificial hip joint: Secondary | ICD-10-CM | POA: Diagnosis not present

## 2021-09-08 DIAGNOSIS — R2689 Other abnormalities of gait and mobility: Secondary | ICD-10-CM | POA: Diagnosis not present

## 2021-09-08 DIAGNOSIS — G4700 Insomnia, unspecified: Secondary | ICD-10-CM | POA: Diagnosis not present

## 2021-09-09 ENCOUNTER — Telehealth: Payer: Self-pay | Admitting: *Deleted

## 2021-09-09 NOTE — Telephone Encounter (Signed)
Follow up Call-  Call back number 09/05/2021  Post procedure Call Back phone  # [phone]  Permission to leave phone message Yes  Some recent data might be hidden     Patient questions:  Do you have a fever, pain , or abdominal swelling? No. Pain Score  0 *  Have you tolerated food without any problems? Yes.    Have you been able to return to your normal activities? Yes.    Do you have any questions about your discharge instructions: Diet   No. Medications  No. Follow up visit  No.  Do you have questions or concerns about your Care? No.  Actions: * If pain score is 4 or above: No action needed, pain <4.

## 2021-09-09 NOTE — Telephone Encounter (Signed)
Follow up Call-  Call back number 09/05/2021  Post procedure Call Back phone  # [phone]  Permission to leave phone message Yes  Some recent data might be hidden   [facility]

## 2021-09-17 DIAGNOSIS — M25552 Pain in left hip: Secondary | ICD-10-CM | POA: Diagnosis not present

## 2021-09-17 DIAGNOSIS — M7918 Myalgia, other site: Secondary | ICD-10-CM | POA: Diagnosis not present

## 2021-09-17 DIAGNOSIS — M9904 Segmental and somatic dysfunction of sacral region: Secondary | ICD-10-CM | POA: Diagnosis not present

## 2021-09-17 DIAGNOSIS — M9905 Segmental and somatic dysfunction of pelvic region: Secondary | ICD-10-CM | POA: Diagnosis not present

## 2021-09-17 DIAGNOSIS — M545 Low back pain, unspecified: Secondary | ICD-10-CM | POA: Diagnosis not present

## 2021-09-17 DIAGNOSIS — M9903 Segmental and somatic dysfunction of lumbar region: Secondary | ICD-10-CM | POA: Diagnosis not present

## 2021-09-18 NOTE — Progress Notes (Signed)
[name] [name],  The polyp which I removed during your recent procedure was proven to be completely benign but is considered a "pre-cancerous" polyp that MAY have grown into cancer if it had not been removed.  Studies shows that at least 20% of women over age 70 and 30% of men over age 68 have pre-cancerous polyps.  Based on current nationally recognized surveillance guidelines, I recommend that you have a repeat colonoscopy in 7 years.   If you develop any new rectal bleeding, abdominal pain or significant bowel habit changes, please contact me before then.  Ryen Rhames E. Candis Schatz, MD [facility]

## 2021-09-24 DIAGNOSIS — Z9181 History of falling: Secondary | ICD-10-CM | POA: Diagnosis not present

## 2021-09-24 DIAGNOSIS — E1142 Type 2 diabetes mellitus with diabetic polyneuropathy: Secondary | ICD-10-CM | POA: Diagnosis not present

## 2021-09-24 DIAGNOSIS — R42 Dizziness and giddiness: Secondary | ICD-10-CM | POA: Diagnosis not present

## 2021-09-29 DIAGNOSIS — M9903 Segmental and somatic dysfunction of lumbar region: Secondary | ICD-10-CM | POA: Diagnosis not present

## 2021-09-29 DIAGNOSIS — M7918 Myalgia, other site: Secondary | ICD-10-CM | POA: Diagnosis not present

## 2021-09-29 DIAGNOSIS — M25552 Pain in left hip: Secondary | ICD-10-CM | POA: Diagnosis not present

## 2021-09-29 DIAGNOSIS — M9904 Segmental and somatic dysfunction of sacral region: Secondary | ICD-10-CM | POA: Diagnosis not present

## 2021-09-29 DIAGNOSIS — M545 Low back pain, unspecified: Secondary | ICD-10-CM | POA: Diagnosis not present

## 2021-09-29 DIAGNOSIS — M9905 Segmental and somatic dysfunction of pelvic region: Secondary | ICD-10-CM | POA: Diagnosis not present

## 2021-10-01 DIAGNOSIS — Z9181 History of falling: Secondary | ICD-10-CM | POA: Diagnosis not present

## 2021-10-06 ENCOUNTER — Telehealth: Payer: Self-pay | Admitting: Neurology

## 2021-10-06 ENCOUNTER — Telehealth (INDEPENDENT_AMBULATORY_CARE_PROVIDER_SITE_OTHER): Payer: Medicare Other | Admitting: Neurology

## 2021-10-06 DIAGNOSIS — G609 Hereditary and idiopathic neuropathy, unspecified: Secondary | ICD-10-CM | POA: Diagnosis not present

## 2021-10-06 DIAGNOSIS — I6340 Cerebral infarction due to embolism of unspecified cerebral artery: Secondary | ICD-10-CM | POA: Diagnosis not present

## 2021-10-06 DIAGNOSIS — E1142 Type 2 diabetes mellitus with diabetic polyneuropathy: Secondary | ICD-10-CM

## 2021-10-06 NOTE — Telephone Encounter (Signed)
Please make one hour appointment in 6 months with Dr Jaynee Eagles

## 2021-10-06 NOTE — Progress Notes (Signed)
[name] [name]    Provider:  Dr Jaynee Eagles Referring Provider: [name], MD Primary Care Physician:  [name], MD   CC:  Stroke, Cavernous sinus mass s/p surgery, dizziness, peripheral neuropathy. Her risk factors are being overweight and prior smoking.   Virtual Visit via Video Note  I connected with [name] [name] on 10/06/21 at  8:00 AM EST by a video enabled telemedicine application and verified that I am speaking with the correct person using two identifiers.  Location: Patient: home Provider: office   I discussed the limitations of evaluation and management by telemedicine and the availability of in person appointments. The patient expressed understanding and agreed to proceed.   Follow Up Instructions:    I discussed the assessment and treatment plan with the patient. The patient was provided an opportunity to ask questions and all were answered. The patient agreed with the plan and demonstrated an understanding of the instructions.   The patient was advised to call back or seek an in-person evaluation if the symptoms worsen or if the condition fails to improve as anticipated.  I provided over 40 minutes of non-face-to-face time during this encounter.  Patient complains of symptoms per HPI as well as the following symptoms: weight gain . Pertinent negatives and positives per HPI. All others negative    [name] Beam, MD   10/06/2021: She is having aphasia, discussed repeat MRI brain but at this time appears mild and non toxc, other causes of aphasia or word finding difficulties include, we discussed speech therapy. We discussed some skin removal, she had abdominoplasty with [name] [name]. She had facelift in [place] run Dr. Jackquline [name]. We spoke about mounjaro, [facility] and talk to them. She had qutenza but it was $1500, she couldn't do the [place]. On top of the right botox. I am in the process of checking to  and see if qutenza has a copay assistance program. We spoke about other options like botox and I spoke to my team about that as well.    Interval history 04/07/2021: She had nephrectomy 2 weeks ago, also removed 2 lymph nodes, she is doing great, she even went to the gym, pathology has not returned yet. Surgeon says everything was removed, she may have to have scans but she may not have to have chemo or radiation.   She has her [facility] restorer, she feels much better with her neuropathy, at least >70% improvement in pain, increased quality of life, increased ability to exercise/mobility, she is doing great, she is using it every day, no side effects, 100% compliance and doing excellent in all regards.   Patient complains of symptoms per HPI as well as the following symptoms: s/p surgery doing well . Pertinent negatives and positives per HPI. All others negative   Interval history 02/05/2021: She has peripheral neuropathy. Diabetes and B12 thought to contribute to her neuropathy ongoing for 20+ years. Hgba1c now better with weight loss4.7, B12 414 and has been in a good range the last year, we did genetic testing, we checked b1, b6, b12, tsh, ana/reflex, IFE/Spep, heavy metals, RF celiac Abs, hep c, rpr, sjogren's, sed rate, it is more numb, she has a burning electrical pain on the ventral medial surface, genetic panel invitae was negative 80 genes plus amyloid. 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 MRI of the lumbar spine in the past showed some mild stenosis and multilevel spondylosis,,  she is having lower back pain, may repeat the MRI of the lumbar spine for worsening spinal stenosis.   Patient complains of symptoms per HPI as well as the following symptoms:intentional weight loss . Pertinent negatives and positives per HPI. All others negative    Interval history 07/09/2019: Can come off of xarelto for surgery. 3 years without  afib. If we are asked happy to write her a note. She had a massage Wednesday and Thursday she had an epicode of vertigo, she turned over in bed and had a second episode. She felt dizzy for a few days and then again had imbalance and fell with dizziness. She had vestibular therapy and was better. Happy to give her valium as needed. She recently had epley maneuver teaching again and feels better.   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  In the past we discussed smoking which can cause cerebrovascular atherosclerosis.  We recommended the healthy weight and wellness center.  No signs or symptoms of sleep apnea, and had a negative sleep eval in the past.  She also had a cavernous sinus mass status post removal. She has had a loop recorder placed.  She is on Gabapentin for neuropathy, Xarelto for stroke prevention. Takes Vitamin B supplements. She had bariatric surgery, resolved OSA. Loop hasn;t shown any afib or aflutter. Continues the Xarelto. She goes yearly for MRi to evaluate for previous sinus mass. Gabapentin helps. She continues to take B12 daily. HgbA1c 6.2 up a bit. She follows for cholesterol. She is taking Calcium and vitamin D daily now since showing Osteopenia. Weight stable.   Interval history 01/18/2017:  Patient is here for follow up. 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 Feet are burning a little more. 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  Recommend the Healthy Weight and [place] to address her obesity and multiple joint pain. She does not snore and is not tired during the day, had a borderline sleep evaluation in the past will monitor for OSA, The burning in her feet is worsening.  More noticeable at night. She is on Lipoic acid daily.    Interval history  07/20/2016: She has had neuropathy for years.She has had neuropathy for over 15 years or longer. It started in the feet. Numbness, burning in the feet, they feel cold to the touvh but she feels them burning, she has pain in the feet. She tried Lyrica possible in a neuropathy drug trial and she believe she had side effects. Worse at night in bed. Cramping in the feet.    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 She does not know how long she had B12 deficiency. Her last B12 was normal 843. She takes oral supplements. Also HgbA1c 6.0. TSH 2.070. She also has a history of diabetes which improved after bariatric surgery but still elevated 6.0. HgbA1c was as high as 7.7 in the past.    Interval history 06/09/2016: Since being seen patient has had another stroke. She was admitted 04/10/2016. She had newly identified clot by LE by doppler but TCD bubble study was negative. She was discharged on Xarelto. Loop recorder was placed. She presented to the ED with slurred speech. Most symptoms have resolved. She cannot walk very well, her legs and feet  are very numb. She has numbness in the feet and legs. She has imbalance. And she has had a history of B12 deficiency. She still has the mouth numbness and right hand sensory changes from previous stroke but the aphasia is resolved.    Ct Head Wo Contrast 04/08/2016  1. 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 2. New focal low-density in the left posterior frontal periventricular white matter, potentially a subacute small vessel infarct. Extensive chronic small vessel ischemic changes in the periventricular white matter are otherwise stable. No evidence of acute cortical stroke or hemorrhage.    Mr Brain Wo Contrast 04/08/2016  1. Confirmation of acute nonhemorrhagic white matter infarct involving the left corona radiata and posterior centrum semi of ally. 2. Otherwise stable severe  periventricular and diffuse subcortical T2 hyperintensities, advanced for age. This represents the sequela of chronic microvascular ischemia. 3. 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    Ct Angio Head & Neck W Or Wo Contrast 04/09/2016  1. Positive for arterial abnormalities associated with the acute white matter ischemia: Severe stenosis at the origin of a left MCA middle sylvian division M2 branch. Moderate irregularity and stenosis in anterior division left M2/ M3 branches. No left MCA M1 stenosis or occlusion. 2. Otherwise carotid bifurcation and ICA siphon atherosclerosis without hemodynamically significant stenosis. 3. Negative posterior circulation. 4. Stable CT appearance of the brain since yesterday. No associated hemorrhage or mass effect. 5. Abnormal CT appearance of the sella turcica and left cavernous sinus corresponding to the chronic invasive pituitary adenoma. 6. No acute findings in the neck.    LE venous dopplers Right lower extremity is negative for deep vein thrombosis. The left lower extremity is positive for deep vein thrombosis involving a small segment of the left mid femoral vein. There is no evidence of Baker's cyst bilaterally   TEE Normal TEE. No cardioembolic source.   TCD bubble study - negative for PFO   HPI:  [name] [name] is a 71 y.o. female here as a follow-up from the hospital after a stroke. She still has numbness in the right fingers and moth due to the left thalamic stroke. We discussed her strokes, she is due for Cardiac Monitoring and then possibly Loop. Will discuss with our Vascular doctors. Also discussed at length the invasive pituitary tumor shown, I reviewed imaging with patient and pointed out findingd, discussed next steps including MRI w/wo with pituitary protocol as well as referral to [name] [name].She was not on an aspirin when this happened.    Reviewed notes, labs and  imaging from outside physicians, which showed:   IMPRESSION: 1. Acute lacunar infarct in the left thalamus. 2. Possible subacute lacunar infarct in the right splenium of the corpus callosum. 3. Moderate chronic small vessel ischemic disease. 4. Mass involving the sella, left cavernous sinus, and left sphenoid sinus, suspicious for invasive pituitary macroadenoma. Further evaluation with nonemergent pituitary protocol MRI (without and with contrast) is recommended. NEUROSURGERY REVIEWED AND FEELS THIS IS A MASS OF THE SPHENOID SINUS THAT HAS INVADED THE CAVERNOUS SINUS,NOT PITUITARY TUMOR 5. No major intracranial arterial occlusion or significant proximal posterior circulation stenosis. 6. Severe proximal left M2 MCA stenoses.   Review of Systems: Patient complains of symptoms per HPI as well as the following symptoms: neuropathy . Pertinent negatives and positives per HPI. All others negative    Social History  Socioeconomic History   Marital status: Single    Spouse name: Not on file   Number of children: 0   Years of education: college   Highest education level: Not on file  Occupational History   Occupation: Retired  Tobacco Use   Smoking status: Former    Packs/day: 1.00    Years: 40.00    Pack years: 40.00    Types: Cigarettes    Quit date: 08/2006    Years since quitting: 15.1   Smokeless tobacco: Never  Vaping Use   Vaping Use: Never used  Substance and Sexual Activity   Alcohol use: Yes    Alcohol/week: 2.0 standard drinks    Types: 2 Glasses of wine per week    Comment: 07/06/2019 maybe 2 glasses of wine/week; 04/08/2016 "5-6 drinks/year'   Drug use: No    Types: Marijuana    Comment: 04/08/2016 "did some marijuana in college"   Sexual activity: Never    Birth control/protection: Surgical  Other Topics Concern   Not on file  Social History Narrative   Lives at home alone   Right-handed   Drinks 4 cups of coffee a day    Social Determinants of Health    Financial Resource Strain: Not on file  Food Insecurity: Not on file  Transportation Needs: Not on file  Physical Activity: Not on file  Stress: Not on file  Social Connections: Not on file  Intimate Partner Violence: Not on file    Family History  Problem Relation Age of Onset   Neuropathy Mother    Hypertension Mother    Hyperlipidemia Mother    Thyroid disease Mother    Prostate cancer Father    Lymphoma Father    Hypertension Father    Hyperlipidemia Father    Cancer Father    Obesity Father    Brain cancer Father    Congestive Heart Failure Maternal Grandmother    Lung cancer Maternal Grandfather    Stomach cancer Neg Hx    Colon cancer Neg Hx    Esophageal cancer Neg Hx    Pancreatic cancer Neg Hx     Past Medical History:  Diagnosis Date   Anemia    Arthritis    "knees, hips, possibly back" (04/08/2016)   Atrial septal aneurysm    B12 deficiency    a. following gastric bypass.   Back pain    Cancer of kidney (HCC)    Cataract    Chronic lower back pain    spondylosis   Cold feet    Daily headache    "short ones for the last month or 2" (04/08/2016)   Depression    Diabetes (HCC)    Dry mouth    DVT (deep venous thrombosis) (HCC)    Easy bruising    Floaters in visual field    GERD (gastroesophageal reflux disease)    takes Protonix daily "just to protect my stomach; not for reflux" (04/08/2016)   Hay fever    Heart murmur    "dx'd by 1 anesthesiologist years and years ago"   History of loop recorder 03/2016   Hyperlipidemia    Hypertension    Joint pain    Leg cramp    Macular degeneration    "just watching"   Neuropathy    Nosebleed    Palpitations    Peripheral edema    Peripheral neuropathy    not on any meds   Pituitary mass ([place])    a. s/p endoscopic  surgery 02/2016.   Pneumonia 12/2017   Sleep apnea    no CPAP since weight loss after bariatric surgery '12 (04/08/2016)   Spondylosis    Stroke ([place]) 04/07/2016   a. 01/2016 and  03/2016. during admission had + LE DVT.   Swelling of extremity    TIA (transient ischemic attack)    Trouble in sleeping    Urinary incontinence    Weakness     Past Surgical History:  Procedure Laterality Date   ABDOMINOPLASTY     BRACHIOPLASTY     and more tissue removed during a second surgery   CATARACT EXTRACTION W/ INTRAOCULAR LENS  IMPLANT, BILATERAL Bilateral ~ 2012   COLONOSCOPY     EP IMPLANTABLE DEVICE N/A 04/10/2016   Procedure: Loop Recorder Insertion;  Surgeon: Will Meredith Leeds, MD;  Location: [facility];  Service: Cardiovascular;  Laterality: N/A;   ESOPHAGOGASTRODUODENOSCOPY     in the 70's   FACIAL COSMETIC SURGERY     JOINT REPLACEMENT     MASTOPEXY     NEPHRECTOMY  03/2021   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   SINUS ENDO W/FUSION  02/28/2016   Procedure: ENDOSCOPIC SINUS SURGERY WITH NAVIGATION;  Surgeon: Ruby Cola, MD;  Location: [facility];  Service: ENT;;   TEE WITHOUT CARDIOVERSION N/A 04/10/2016   Procedure: TRANSESOPHAGEAL ECHOCARDIOGRAM (TEE) POSSIBLE LOOP;  Surgeon: Sanda Klein, MD;  Location: [facility];  Service: Cardiovascular;  Laterality: N/A;   [name] Right 2008   TOTAL HIP REVISION Right 03/30/2016   Procedure: RIGHT TOTAL HIP REVISION;  Surgeon: Frederik Pear, MD;  Location: [facility];  Service: Orthopedics;  Laterality: Right;   TOTAL KNEE ARTHROPLASTY Bilateral 2001   VAGINAL HYSTERECTOMY     "left my ovaries"    Current Outpatient Medications  Medication Sig Dispense Refill   capsicum (ZOSTRIX) 0.075 % topical cream Apply 1 application topically 3 (three) times daily. 5 g 3   Cholecalciferol (VITAMIN D3) 250 MCG (10000 UT) capsule Take 1 capsule (10,000 Units total) by mouth daily. 30 capsule 0   diazepam (VALIUM) 2 MG tablet TAKE 1 TABLET BY MOUTH TWICE A DAY AS NEEDED FOR VERTIGO     gabapentin (NEURONTIN) 300 MG capsule Take by mouth.     hydrochlorothiazide (HYDRODIURIL) 25 MG tablet  Take by mouth.     lidocaine-prilocaine (EMLA) cream Apply 1 application topically as needed. (Patient not taking: Reported on 09/05/2021) 30 g 3   losartan (COZAAR) 100 MG tablet Take 100 mg by mouth daily.     magnesium oxide (MAG-OX) 400 MG tablet Take 400 mg by mouth at bedtime.      Multiple Vitamins-Minerals (OCUVITE-LUTEIN PO) Take 1 tablet by mouth daily.     predniSONE (STERAPRED UNI-PAK 21 TAB) 5 MG (21) TBPK tablet Take 5 mg by mouth daily.     rosuvastatin (CRESTOR) 20 MG tablet Take 20 mg by mouth at bedtime.     XARELTO 20 MG TABS tablet Take 1 tablet (20 mg total) by mouth daily with supper. 90 tablet 3   No current facility-administered medications for this visit.    Allergies as of 10/06/2021 - Review Complete 09/05/2021  Allergen Reaction Noted   Ambien [zolpidem] Other (See Comments) 03/27/2016   Atorvastatin Other (See Comments) 02/24/2016   Codeine Nausea And Vomiting 05/29/2011   Penicillins Itching and Other (See Comments) 05/29/2011   Simvastatin Other (See Comments) 03/27/2016   Meclizine  07/06/2019   Meclizine  hcl  11/18/2020   Other Other (See Comments) 11/18/2020   Byetta 10 mcg pen [exenatide] Nausea Only 03/27/2016     Physical exam: Exam: Gen: NAD, conversant      CV: attempted, Could not perform over Web Video. Denies palpitations or chest pain or SOB. VS: Breathing at a normal rate. Weight appears within normal limits. Not febrile. Eyes: Conjunctivae clear without exudates or hemorrhage  Neuro: Detailed Neurologic Exam  Speech:    Speech is normal; fluent and spontaneous with normal comprehension.  Cognition:    The patient is oriented to person, place, and time;     recent and remote memory intact;     language fluent;     normal attention, concentration,     fund of knowledge Cranial Nerves:    The pupils are equal, round, and reactive to light. Cannot perform fundoscopic exam. Visual fields are full to finger confrontation. Extraocular  movements are intact.  The face is symmetric with normal sensation. The palate elevates in the midline. Hearing intact. Voice is normal. Shoulder shrug is normal. The tongue has normal motion without fasciculations.   Coordination:    Normal finger to nose  Gait:    Normal native gait  Motor Observation:   no involuntary movements noted. Tone:    Appears normal  Posture:    Posture is normal. normal erect    Strength:    Strength is anti-gravity and symmetric in the upper and lower limbs.      Sensation: intact to LT     Reflex Exam:  LAST Sensation(improved): decreased pin prick and temp to the mid calves, absent vibration to the ankles, intact proprioception. Numbness right knee. Decreased hypesthesias and dyesthesias since using [facility] restorer but still with significant pain  Absent AJs. Stable (last exam, video today)      Assessment and Plan: Ms. [name] [name] is a 71 y.o. female with history of HTN, HLD,TIA/stroke, diet controlled DM type 2, peripheral neuropathy,s/p gastric bypass. .  L corona radiata/posterior centrum semiovale infarct, etiology not clear. She had left thalamic and right splenium punctate infarcts in 01/2016, suspicious for cardioembolic infarcts. Pt does have severe L M2 inferior division stenosis and recent hypotension, but the distribution of M2 inferior does not fit into these infarcts. TEE normal and loop placed. Found to have DVT likely due to recent hip surgery but TCD bubble study no PFO.    She still has pain, qutenza 1500, tried multitude of medications, topicals (lidocaine, capsaicin, compounded) will see if qutenza has a copay program and will see about trying botox  - Tried gabapentin, been to a chiropractor and paid out of pocket for treatments $6000, lyrica, tylenol, topical creams/lidocaine/compounded cream), elavil, etodolac, alpha lipoic acid, tramadol, tylnenol, alpha lipoic acid, elavil,asa, etodolac, mag, robaxin, prednisone,tramadol.  Could try cymbalta next   - She has peripheral neuropathy. Diabetes and B12 thought to contribute to her neuropathy ongoing for 20+ years. Hgba1c now 4.7, B12 414 and has been in a good range the last year, we did genetic testing, we checked b1, b6, b12, tsh, ana/reflex, IFE/Spep, heavy metals, RF celiac Abs, hep c, rpr, sjogren's, sed rate, it is more numb, she has a burning electrical pain on the ventral medial surface, genetic panel invitae was negative 80 genes plus amyloid. 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 MRI of the lumbar spine in the past showed some mild stenosis and multilevel spondylosis,, she is having lower  back pain, may repeat the MRI of the lumbar spine for worsening spinal stenosis.  - Tried gabapentin, been to a chiropractor and paid out of pocket for treatments $6000, lyrica, tylenol, topical creams/lidocaine/compounded cream), elavil, etodolac, alpha lipoic acid, tramadol.   - numbness in the hands, been to the hand center, likely CTS.  - Can come off of xarelto for surgery. 3+ years without afib. If we are asked happy to write her a note.   - Vertigo. She had vestibular therapy and was better. Happy to give her valium as needed. She recently had epley maneuver teaching again and feels better.   - Genetic testing for polyneuroathy, worsening, extensive testing unremarkable  - continue Xarelto for stroke prevention, for life   PRIOR Assessment and plan previous appointments :   - Emg/ncs of the left arm and left leg showed length-dependent axonal predominantly sensory neuropathy. Risk factors include b12 deficiency and diabetes. Will test for other causes of neuropathy. neurontin at night.Alpha-lipoic acid daily. Discussed side effects as per patient instructions.   - Loop recorder placed 7/21 no afib or aflutter for close to 2 years - TCD bubble negative for PF -  LDL, HgbA1c controlled -  BP  goal 130-150 due to left M2 severe stenosis - Continue statin   - HgbA1c 6.2, goal < 7.0 - Hx OSA, no CPAP since bariatric surgery in 2012, she denies any signs or symptoms no fatigue, not nodding off, nor morning headaches, no excessive fatigue, no known snoring. (she lost weight 336 was her max weight now 255) - Recommend the healthy weight and wellness center: sent referral  No orders of the defined types were placed in this encounter.   - continue Xarelto - Continue neurontin for neuropathy  Cc; Dr. Tawny Asal, MD  [name] [address] [place], [place] [address]  Phone [phone] Fax [phone]

## 2021-10-06 NOTE — Telephone Encounter (Signed)
Melvenia Beam, MD  P [facility] Calls; Jaci Carrel, can we get her approved for neuropathic pain so I can do botox on her feet? And what would he copay be (let her know) thanks!

## 2021-10-07 ENCOUNTER — Ambulatory Visit (INDEPENDENT_AMBULATORY_CARE_PROVIDER_SITE_OTHER): Payer: Medicare Other | Admitting: Family Medicine

## 2021-10-07 ENCOUNTER — Encounter (INDEPENDENT_AMBULATORY_CARE_PROVIDER_SITE_OTHER): Payer: Self-pay | Admitting: Family Medicine

## 2021-10-07 ENCOUNTER — Other Ambulatory Visit: Payer: Self-pay

## 2021-10-07 VITALS — BP 150/76 | HR 78 | Temp 97.7°F | Ht 63.0 in | Wt 184.0 lb

## 2021-10-07 DIAGNOSIS — E559 Vitamin D deficiency, unspecified: Secondary | ICD-10-CM | POA: Diagnosis not present

## 2021-10-07 DIAGNOSIS — R011 Cardiac murmur, unspecified: Secondary | ICD-10-CM | POA: Diagnosis not present

## 2021-10-07 DIAGNOSIS — I10 Essential (primary) hypertension: Secondary | ICD-10-CM | POA: Diagnosis not present

## 2021-10-07 DIAGNOSIS — E1169 Type 2 diabetes mellitus with other specified complication: Secondary | ICD-10-CM

## 2021-10-07 DIAGNOSIS — E538 Deficiency of other specified B group vitamins: Secondary | ICD-10-CM | POA: Diagnosis not present

## 2021-10-07 DIAGNOSIS — E785 Hyperlipidemia, unspecified: Secondary | ICD-10-CM | POA: Diagnosis not present

## 2021-10-07 DIAGNOSIS — C641 Malignant neoplasm of right kidney, except renal pelvis: Secondary | ICD-10-CM | POA: Diagnosis not present

## 2021-10-07 DIAGNOSIS — Z6832 Body mass index (BMI) 32.0-32.9, adult: Secondary | ICD-10-CM | POA: Diagnosis not present

## 2021-10-07 MED ORDER — TIRZEPATIDE 2.5 MG/0.5ML ~~LOC~~ SOAJ: 2.5000 mg | SUBCUTANEOUS | 0 refills | Status: DC

## 2021-10-07 NOTE — Progress Notes (Signed)
Chief Complaint:   OBESITY [name] [name] is here to discuss her progress with her obesity treatment plan along with follow-up of her obesity related diagnoses. Marcelene is on the Category 2 Plan and states she is following her eating plan approximately 0% of the time. Alessandria states she is doing cardio and strength for 60 minutes 5-6 times per week.  Today's visit was #: 51 Starting weight: 258 lbs Starting date: 04/27/2018 Today's weight: 184 lbs Today's date: 10/07/2021 Total lbs lost to date: 74 lbs Total lbs lost since last in-office visit: gained 20 lbs  Interim History:  [name] [name] has not had an office visit since 06-04-2021. She has been totally off plan. She is ready to get back on track. Her highest weight was 248 lbs and lowest weight was 149 lbs. She is consistent with exercise but notes she is doing less strength training. She drinks alcohol most days in the evenings-cocktails.    Subjective:   1. Type 2 diabetes mellitus with other specified complication, without long-term current use of insulin (HCC) Shantera's last A1C was very low (4.8). Her Type 2 diabetes mellitus is mostly in remission. She did restart Metformin today. Her A1C was done at primary care physician today.   Lab Results  Component Value Date   HGBA1C 4.8 06/11/2021   HGBA1C 4.7 (L) 12/12/2020   HGBA1C 4.8 04/04/2020   Lab Results  Component Value Date   LDLCALC 90 06/11/2021   CREATININE 0.94 06/11/2021   Lab Results  Component Value Date   INSULIN 1.5 (L) 12/12/2020   INSULIN 4.0 04/04/2020   INSULIN 3.5 10/12/2019   INSULIN 6.9 08/02/2018   INSULIN 7.0 04/27/2018    2. Vitamin D deficiency [name] [name]'s last Vitamin D was 51.4. She is taking 10,000 IU daily of Vitamin D.  Lab Results  Component Value Date   VD25OH 51.4 06/11/2021   VD25OH 45.3 12/12/2020   VD25OH 44.6 06/27/2020    Assessment/Plan:   1. Type 2 diabetes mellitus with other specified complication, without long-term current use of  insulin (HCC) New Rx- Mounjaro 2.5 mg weekly. Her drug coverage is not through Medicare so hopefully coupon will work.  - tirzepatide [facility]) 2.5 MG/0.5ML Pen; Inject 2.5 mg into the skin once a week.  Dispense: 2 mL; Refill: 0  2. Vitamin D deficiency [name] [name] had labs at primary care physician's today including Vitamin D. She will follow-up for routine testing of Vitamin D, at least 2-3 times per year to avoid over-replacement.  3. Obesity: Current BMI 32.6 [name] [name] is currently in the action stage of change. As such, her goal is to continue with weight loss efforts. She has agreed to the Category 2 Plan.   Exercise goals:  As is.  Behavioral modification strategies: increasing lean protein intake, decreasing simple carbohydrates, and decreasing liquid calories.  Shann has agreed to follow-up with our clinic in 3 weeks.  Objective:   Blood pressure (!) 150/76, pulse 78, temperature 97.7 F (36.5 C), height 5' 3" (1.6 m), weight 184 lb (83.5 kg), SpO2 99 %. Body mass index is 32.59 kg/m.  General: Cooperative, alert, well developed, in no acute distress. HEENT: Conjunctivae and lids unremarkable. Cardiovascular: Regular rhythm.  Lungs: Normal work of breathing. Neurologic: No focal deficits.   Lab Results  Component Value Date   CREATININE 0.94 06/11/2021   BUN 16 06/11/2021   NA 131 (L) 06/11/2021   K 5.1 06/11/2021   CL 92 (L) 06/11/2021   CO2 24 06/11/2021  Lab Results  Component Value Date   ALT 16 06/11/2021   AST 20 06/11/2021   ALKPHOS 65 06/11/2021   BILITOT 0.3 06/11/2021   Lab Results  Component Value Date   HGBA1C 4.8 06/11/2021   HGBA1C 4.7 (L) 12/12/2020   HGBA1C 4.8 04/04/2020   HGBA1C 4.6 (L) 10/12/2019   HGBA1C 4.8 05/04/2019   Lab Results  Component Value Date   INSULIN 1.5 (L) 12/12/2020   INSULIN 4.0 04/04/2020   INSULIN 3.5 10/12/2019   INSULIN 6.9 08/02/2018   INSULIN 7.0 04/27/2018   Lab Results  Component Value Date   TSH 1.930  06/11/2021   Lab Results  Component Value Date   CHOL 197 06/11/2021   HDL 93 06/11/2021   LDLCALC 90 06/11/2021   TRIG 77 06/11/2021   CHOLHDL 2.7 04/09/2016   Lab Results  Component Value Date   VD25OH 51.4 06/11/2021   VD25OH 45.3 12/12/2020   VD25OH 44.6 06/27/2020   Lab Results  Component Value Date   WBC 5.4 06/11/2021   HGB 11.6 06/11/2021   HCT 35.6 06/11/2021   MCV 89 06/11/2021   PLT 261 06/11/2021   Lab Results  Component Value Date   IRON 68 06/11/2021   TIBC 299 06/11/2021   FERRITIN 53 06/11/2021   Attestation Statements:   Reviewed by clinician on day of visit: allergies, medications, problem list, medical history, surgical history, family history, social history, and previous encounter notes.  I, Lizbeth Bark, RMA, am acting as Location manager for [name], [place].  I have reviewed the above documentation for accuracy and completeness, and I agree with the above. -  Georgianne Fick, FNP

## 2021-10-08 ENCOUNTER — Telehealth (INDEPENDENT_AMBULATORY_CARE_PROVIDER_SITE_OTHER): Payer: Self-pay

## 2021-10-08 ENCOUNTER — Encounter (INDEPENDENT_AMBULATORY_CARE_PROVIDER_SITE_OTHER): Payer: Self-pay | Admitting: Family Medicine

## 2021-10-08 NOTE — Telephone Encounter (Addendum)
This letter is to inform you that we did not approve your recent Prior Approval request for Mounjaro (tirzepatide). How we based our decision The indicated use of this medication, as provided does not meet the [employer] and [employer] Plans criteria due to the following reason: the use of this medication for the treatment of type 2 diabetes mellitus without a hemoglobin A1c greater than 7.0 percent, or without providing a hemoglobin A1c level does not establish medical necessity for this drug. Medical necessity is determined by adherence to generally accepted standards of medical practice in the [place], is clinically appropriate, in terms of type, frequency, extent, site, duration and considered effective for the patient's illness, injury, disease, or its symptoms. For more information, please refer to the [employer] and [employer] brochure (RI 71-005 or [place] 71-017). Details regarding medical necessity are listed in section 10.

## 2021-10-09 ENCOUNTER — Telehealth: Payer: Self-pay | Admitting: *Deleted

## 2021-10-09 DIAGNOSIS — M6281 Muscle weakness (generalized): Secondary | ICD-10-CM | POA: Diagnosis not present

## 2021-10-09 DIAGNOSIS — E1142 Type 2 diabetes mellitus with diabetic polyneuropathy: Secondary | ICD-10-CM | POA: Diagnosis not present

## 2021-10-09 DIAGNOSIS — G4700 Insomnia, unspecified: Secondary | ICD-10-CM | POA: Diagnosis not present

## 2021-10-09 DIAGNOSIS — Z96653 Presence of artificial knee joint, bilateral: Secondary | ICD-10-CM | POA: Diagnosis not present

## 2021-10-09 DIAGNOSIS — Z96641 Presence of right artificial hip joint: Secondary | ICD-10-CM | POA: Diagnosis not present

## 2021-10-09 DIAGNOSIS — R42 Dizziness and giddiness: Secondary | ICD-10-CM | POA: Diagnosis not present

## 2021-10-09 NOTE — Telephone Encounter (Signed)
-----  Message from Melvenia Beam, MD sent at 10/06/2021  1:01 PM EST ----- Patient's qutenza was $1500. Can pod 4 check and see if they have a patient assistance program?  Taylos, can we get her approved for neuropathic pain so I can do botox on her feet? Same as the other women I have done? And what would he copay be (et he know) thanks!

## 2021-10-09 NOTE — Telephone Encounter (Signed)
I sent pt a mychart message that I spoke to [place] with qutenza and no PAP.  Copay assistance, she is medicare and is not eligible (even if her secondary [place] FEP is not Veterinary surgeon at [place].   Thayer Headings has just spoken to her/someone about this.

## 2021-10-09 NOTE — Telephone Encounter (Signed)
I called FEP [employer], spoke with Gwyndolyn Saxon (REF# [id number]). He said CPT codes [place], [phone], and [phone] were covered based on medical necessity, no reauthorization is required. Sent patient Mychart message to get her scheduled.

## 2021-10-09 NOTE — Telephone Encounter (Signed)
I called and spoke to [place] at [place].  Pt has a $1500 copay for the [place].  I asked if they had Pt assistance (PAP) for pts.  She referred me to copay savings card.  Pt has Medicare insurance/ BCBS FEP  Caryl Pina will call me back.

## 2021-10-14 ENCOUNTER — Ambulatory Visit (INDEPENDENT_AMBULATORY_CARE_PROVIDER_SITE_OTHER): Payer: Medicare Other | Admitting: Neurology

## 2021-10-14 DIAGNOSIS — G6289 Other specified polyneuropathies: Secondary | ICD-10-CM | POA: Diagnosis not present

## 2021-10-14 DIAGNOSIS — I6340 Cerebral infarction due to embolism of unspecified cerebral artery: Secondary | ICD-10-CM

## 2021-10-14 NOTE — Progress Notes (Signed)
Botox- 200 units x 1 vial Lot: [id number] Expiration: 05/2024 NDC: 2575-0518-33  Bacteriostatic 0.9% Sodium Chloride- 44m total Lot: [id number] Expiration: 04/22/2023 NDC: [address]-42 Dx: EJ03.12B/B

## 2021-10-14 NOTE — Progress Notes (Signed)
11/19/2020: first injections   History: Refractory idiopathic peripheral diabetic polyneuropathy. Risk factors includes many years of prediabetes and diabetes . Hx of diabetic(per-diabetic) neuropathy.    Procedure: All procedures documented were medically necessary, reasonable and appropriate based on the patient's history, medical diagnosis and physician opinion. Verbal informed consent was obtained from the patient, patient was informed of potential risk of procedure, including bruising, bleeding, hematoma formation, infection, muscle weakness, muscle pain, numbness, transient hypertension, transient hyperglycemia and transient insomnia among others. All areas injected were topically clean with isopropyl rubbing alcohol. Nonsterile nonlatex gloves were worn during the procedure.    Injections were given bilaterally to the plantar surface of the feet.  The feet were properly sterilized with evenly spaced sites bilaterally injected each with 10 units of botox using a 1 ml 30-gauge needle.  200 units of Botox were used and none was wasted.   Dx: G62.89 SAMPLES TODAY (In the future if we continue: 64642 CPT code and [phone] for additional limb)  I spent 30 minutes of face-to-face and non-face-to-face time with patient on the  1. Disease related peripheral neuropathy    diagnosis.  This included previsit chart review, lab review, study review, order entry, electronic health record documentation, patient education on the different diagnostic and therapeutic options, counseling and coordination of care, risks and benefits of management, compliance, or risk factor reduction

## 2021-10-15 DIAGNOSIS — E1142 Type 2 diabetes mellitus with diabetic polyneuropathy: Secondary | ICD-10-CM | POA: Diagnosis not present

## 2021-10-15 DIAGNOSIS — M6281 Muscle weakness (generalized): Secondary | ICD-10-CM | POA: Diagnosis not present

## 2021-10-15 DIAGNOSIS — Z96653 Presence of artificial knee joint, bilateral: Secondary | ICD-10-CM | POA: Diagnosis not present

## 2021-10-15 DIAGNOSIS — G4700 Insomnia, unspecified: Secondary | ICD-10-CM | POA: Diagnosis not present

## 2021-10-15 DIAGNOSIS — R42 Dizziness and giddiness: Secondary | ICD-10-CM | POA: Diagnosis not present

## 2021-10-15 DIAGNOSIS — Z96641 Presence of right artificial hip joint: Secondary | ICD-10-CM | POA: Diagnosis not present

## 2021-10-16 DIAGNOSIS — C641 Malignant neoplasm of right kidney, except renal pelvis: Secondary | ICD-10-CM | POA: Diagnosis not present

## 2021-10-16 DIAGNOSIS — Z905 Acquired absence of kidney: Secondary | ICD-10-CM | POA: Diagnosis not present

## 2021-10-16 DIAGNOSIS — R918 Other nonspecific abnormal finding of lung field: Secondary | ICD-10-CM | POA: Diagnosis not present

## 2021-10-20 DIAGNOSIS — M6281 Muscle weakness (generalized): Secondary | ICD-10-CM | POA: Diagnosis not present

## 2021-10-20 DIAGNOSIS — H353131 Nonexudative age-related macular degeneration, bilateral, early dry stage: Secondary | ICD-10-CM | POA: Diagnosis not present

## 2021-10-20 DIAGNOSIS — Z96653 Presence of artificial knee joint, bilateral: Secondary | ICD-10-CM | POA: Diagnosis not present

## 2021-10-20 DIAGNOSIS — Z96641 Presence of right artificial hip joint: Secondary | ICD-10-CM | POA: Diagnosis not present

## 2021-10-20 DIAGNOSIS — E1142 Type 2 diabetes mellitus with diabetic polyneuropathy: Secondary | ICD-10-CM | POA: Diagnosis not present

## 2021-10-20 DIAGNOSIS — G4700 Insomnia, unspecified: Secondary | ICD-10-CM | POA: Diagnosis not present

## 2021-10-20 DIAGNOSIS — E119 Type 2 diabetes mellitus without complications: Secondary | ICD-10-CM | POA: Diagnosis not present

## 2021-10-20 DIAGNOSIS — H00025 Hordeolum internum left lower eyelid: Secondary | ICD-10-CM | POA: Diagnosis not present

## 2021-10-20 DIAGNOSIS — R42 Dizziness and giddiness: Secondary | ICD-10-CM | POA: Diagnosis not present

## 2021-10-20 DIAGNOSIS — H35371 Puckering of macula, right eye: Secondary | ICD-10-CM | POA: Diagnosis not present

## 2021-10-21 DIAGNOSIS — E785 Hyperlipidemia, unspecified: Secondary | ICD-10-CM | POA: Diagnosis not present

## 2021-10-21 DIAGNOSIS — I359 Nonrheumatic aortic valve disorder, unspecified: Secondary | ICD-10-CM | POA: Diagnosis not present

## 2021-10-21 DIAGNOSIS — R011 Cardiac murmur, unspecified: Secondary | ICD-10-CM | POA: Diagnosis not present

## 2021-10-21 DIAGNOSIS — G459 Transient cerebral ischemic attack, unspecified: Secondary | ICD-10-CM | POA: Diagnosis not present

## 2021-10-21 DIAGNOSIS — I10 Essential (primary) hypertension: Secondary | ICD-10-CM | POA: Diagnosis not present

## 2021-10-21 DIAGNOSIS — I253 Aneurysm of heart: Secondary | ICD-10-CM | POA: Diagnosis not present

## 2021-10-23 ENCOUNTER — Encounter (INDEPENDENT_AMBULATORY_CARE_PROVIDER_SITE_OTHER): Payer: Self-pay | Admitting: Family Medicine

## 2021-10-23 ENCOUNTER — Ambulatory Visit (INDEPENDENT_AMBULATORY_CARE_PROVIDER_SITE_OTHER): Payer: Medicare Other | Admitting: Family Medicine

## 2021-10-23 ENCOUNTER — Other Ambulatory Visit: Payer: Self-pay

## 2021-10-23 VITALS — BP 130/73 | HR 74 | Temp 97.6°F | Ht 63.0 in | Wt 176.0 lb

## 2021-10-23 DIAGNOSIS — E1169 Type 2 diabetes mellitus with other specified complication: Secondary | ICD-10-CM | POA: Diagnosis not present

## 2021-10-23 DIAGNOSIS — Z6831 Body mass index (BMI) 31.0-31.9, adult: Secondary | ICD-10-CM

## 2021-10-23 DIAGNOSIS — E669 Obesity, unspecified: Secondary | ICD-10-CM | POA: Diagnosis not present

## 2021-10-23 DIAGNOSIS — E1159 Type 2 diabetes mellitus with other circulatory complications: Secondary | ICD-10-CM | POA: Diagnosis not present

## 2021-10-23 DIAGNOSIS — Z6841 Body Mass Index (BMI) 40.0 and over, adult: Secondary | ICD-10-CM

## 2021-10-23 DIAGNOSIS — I152 Hypertension secondary to endocrine disorders: Secondary | ICD-10-CM

## 2021-10-23 MED ORDER — OZEMPIC (0.25 OR 0.5 MG/DOSE) 2 MG/1.5ML ~~LOC~~ SOPN: 0.5000 mg | PEN_INJECTOR | SUBCUTANEOUS | 0 refills | Status: DC

## 2021-10-23 NOTE — Progress Notes (Signed)
Chief Complaint:   OBESITY [name] [name] is here to discuss her progress with her obesity treatment plan along with follow-up of her obesity related diagnoses. [name] [name] is on the Category 2 Plan and states she is following her eating plan approximately 65% of the time. [name] [name] states she is doing cardio and strength for 60 minutes 5-6 times per week.  Today's visit was #: 87 Starting weight: 258 lbs Starting date: 04/27/2018 Today's weight: 176 lbs Today's date: 10/23/2021 Total lbs lost to date: 82 lbs Total lbs lost since last in-office visit: 8 lbs  Interim History: [name] [name] has gotten back to the meal plan and done very well - down 8 lbs since last office visit. She is consistent with exercise as always.  Her insurance did not cover Mounjaro.   Subjective:   1. Type 2 diabetes mellitus with other specified complication, without long-term current use of insulin ([name] [name]) [name] [name] notes some polyphagia. Her Type 2 diabetes mellitus is well controlled.   Lab Results  Component Value Date   HGBA1C 4.8 06/11/2021   HGBA1C 4.7 (L) 12/12/2020   HGBA1C 4.8 04/04/2020   Lab Results  Component Value Date   LDLCALC 90 06/11/2021   CREATININE 0.94 06/11/2021   Lab Results  Component Value Date   INSULIN 1.5 (L) 12/12/2020   INSULIN 4.0 04/04/2020   INSULIN 3.5 10/12/2019   INSULIN 6.9 08/02/2018   INSULIN 7.0 04/27/2018    2. Hypertension associated with type 2 diabetes mellitus ([name] [name]) [name] [name]'s blood pressure is well controlled. She is on HCTZ and Losartan currently.   BP Readings from Last 3 Encounters:  10/23/21 130/73  10/07/21 (!) 150/76  09/05/21 (!) 157/76    Assessment/Plan:   1. Type 2 diabetes mellitus with other specified complication, without long-term current use of insulin (HCC) [name] [name] agrees to start Ozempic 0.50 mg weekly. We will check A1C today.   - Hemoglobin A1c - Semaglutide,0.25 or 0.5MG/DOS, (OZEMPIC, 0.25 OR 0.5 MG/DOSE,) 2 MG/1.5ML SOPN; Inject 0.5 mg into the  skin once a week.  Dispense: 1.5 mL; Refill: 0  2. Hypertension associated with type 2 diabetes mellitus ([name] [name]) [name] [name] will continue taking her medications.   3. Obesity: Current BMI 31.18 [name] [name] is currently in the action stage of change. As such, her goal is to continue with weight loss efforts. She has agreed to the Category 2 Plan.   Exercise goals: All adults should avoid inactivity. Some physical activity is better than none, and adults who participate in any amount of physical activity gain some health benefits.  She asked about the possibility of being prescribed phentermine but I advised her she is not a good candidate for this.  Behavioral modification strategies: increasing lean protein intake and decreasing liquid calories.  [name] [name] has agreed to follow-up with our clinic in 2 weeks.  Objective:   Blood pressure 130/73, pulse 74, temperature 97.6 F (36.4 C), height 5' 3" (1.6 m), weight 176 lb (79.8 kg), SpO2 99 %. Body mass index is 31.18 kg/m.  General: Cooperative, alert, well developed, in no acute distress. HEENT: Conjunctivae and lids unremarkable. Cardiovascular: Regular rhythm.  Lungs: Normal work of breathing. Neurologic: No focal deficits.   Lab Results  Component Value Date   CREATININE 0.94 06/11/2021   BUN 16 06/11/2021   NA 131 (L) 06/11/2021   K 5.1 06/11/2021   CL 92 (L) 06/11/2021   CO2 24 06/11/2021   Lab Results  Component Value Date   ALT 16 06/11/2021  AST 20 06/11/2021   ALKPHOS 65 06/11/2021   BILITOT 0.3 06/11/2021   Lab Results  Component Value Date   HGBA1C 4.8 06/11/2021   HGBA1C 4.7 (L) 12/12/2020   HGBA1C 4.8 04/04/2020   HGBA1C 4.6 (L) 10/12/2019   HGBA1C 4.8 05/04/2019   Lab Results  Component Value Date   INSULIN 1.5 (L) 12/12/2020   INSULIN 4.0 04/04/2020   INSULIN 3.5 10/12/2019   INSULIN 6.9 08/02/2018   INSULIN 7.0 04/27/2018   Lab Results  Component Value Date   TSH 1.930 06/11/2021   Lab Results   Component Value Date   CHOL 197 06/11/2021   HDL 93 06/11/2021   LDLCALC 90 06/11/2021   TRIG 77 06/11/2021   CHOLHDL 2.7 04/09/2016   Lab Results  Component Value Date   VD25OH 51.4 06/11/2021   VD25OH 45.3 12/12/2020   VD25OH 44.6 06/27/2020   Lab Results  Component Value Date   WBC 5.4 06/11/2021   HGB 11.6 06/11/2021   HCT 35.6 06/11/2021   MCV 89 06/11/2021   PLT 261 06/11/2021   Lab Results  Component Value Date   IRON 68 06/11/2021   TIBC 299 06/11/2021   FERRITIN 53 06/11/2021   Attestation Statements:   Reviewed by clinician on day of visit: allergies, medications, problem list, medical history, surgical history, family history, social history, and previous encounter notes.  I, Lizbeth Bark, RMA, am acting as Location manager for [name], [place] at [place].  I have reviewed the above documentation for accuracy and completeness, and I agree with the above. -  Georgianne Fick, FNP

## 2021-10-24 LAB — HEMOGLOBIN A1C
Est. average glucose Bld gHb Est-mCnc: 103 mg/dL
Hgb A1c MFr Bld: 5.2 % (ref 4.8–5.6)

## 2021-10-27 DIAGNOSIS — M9903 Segmental and somatic dysfunction of lumbar region: Secondary | ICD-10-CM | POA: Diagnosis not present

## 2021-10-27 DIAGNOSIS — M7918 Myalgia, other site: Secondary | ICD-10-CM | POA: Diagnosis not present

## 2021-10-27 DIAGNOSIS — M9905 Segmental and somatic dysfunction of pelvic region: Secondary | ICD-10-CM | POA: Diagnosis not present

## 2021-10-27 DIAGNOSIS — M25552 Pain in left hip: Secondary | ICD-10-CM | POA: Diagnosis not present

## 2021-10-27 DIAGNOSIS — M9904 Segmental and somatic dysfunction of sacral region: Secondary | ICD-10-CM | POA: Diagnosis not present

## 2021-10-27 DIAGNOSIS — M545 Low back pain, unspecified: Secondary | ICD-10-CM | POA: Diagnosis not present

## 2021-11-03 DIAGNOSIS — H00025 Hordeolum internum left lower eyelid: Secondary | ICD-10-CM | POA: Diagnosis not present

## 2021-11-06 ENCOUNTER — Other Ambulatory Visit: Payer: Self-pay

## 2021-11-06 ENCOUNTER — Encounter (INDEPENDENT_AMBULATORY_CARE_PROVIDER_SITE_OTHER): Payer: Self-pay | Admitting: Family Medicine

## 2021-11-06 ENCOUNTER — Ambulatory Visit (INDEPENDENT_AMBULATORY_CARE_PROVIDER_SITE_OTHER): Payer: Medicare Other | Admitting: Family Medicine

## 2021-11-06 VITALS — BP 123/71 | HR 70 | Temp 97.3°F | Ht 63.0 in | Wt 179.0 lb

## 2021-11-06 DIAGNOSIS — E669 Obesity, unspecified: Secondary | ICD-10-CM | POA: Diagnosis not present

## 2021-11-06 DIAGNOSIS — E1169 Type 2 diabetes mellitus with other specified complication: Secondary | ICD-10-CM

## 2021-11-06 DIAGNOSIS — F5089 Other specified eating disorder: Secondary | ICD-10-CM

## 2021-11-06 DIAGNOSIS — Z6831 Body mass index (BMI) 31.0-31.9, adult: Secondary | ICD-10-CM

## 2021-11-06 DIAGNOSIS — F509 Eating disorder, unspecified: Secondary | ICD-10-CM | POA: Insufficient documentation

## 2021-11-06 DIAGNOSIS — K5903 Drug induced constipation: Secondary | ICD-10-CM | POA: Diagnosis not present

## 2021-11-06 MED ORDER — BUPROPION HCL ER (SR) 150 MG PO TB12: 150.0000 mg | ORAL_TABLET | Freq: Every day | ORAL | 0 refills | Status: DC

## 2021-11-06 MED ORDER — OZEMPIC (1 MG/DOSE) 4 MG/3ML ~~LOC~~ SOPN: 1.0000 mg | PEN_INJECTOR | SUBCUTANEOUS | 0 refills | Status: DC

## 2021-11-06 NOTE — Progress Notes (Signed)
Chief Complaint:   OBESITY [name] [name] is here to discuss her progress with her obesity treatment plan along with follow-up of her obesity related diagnoses. [name] [name] is on the Category 2 Plan and states she is following her eating plan approximately 30% of the time. [name] [name] states she is doing cardio and weights for 60 minutes 5-6 times per week.  Today's visit was #: 3 Starting weight: 258 lbs Starting date: 04/27/2018 Today's weight: 179 lbs Today's date: 11/06/2021 Total lbs lost to date: 79 lbs Total lbs lost since last in-office visit: 0  Interim History: [name] [name] has had 2 doses of Ozempic and does not notice any appetite suppression.  She feels that her biggest issue is emotional eating. She is finding hard to find the motivation to adhere to plan.   Subjective:   1. Drug-induced constipation [name] [name] notes constipation since starting Ozempic. She is taking Miralax daily with some relief.   2. Type 2 diabetes mellitus with other specified complication, without long-term current use of insulin ([name] [name]) [name] [name] has titrated up to 0.5 mg weekly of Ozempic. She notes no appetite suppression.   Lab Results  Component Value Date   HGBA1C 5.2 10/23/2021   HGBA1C 4.8 06/11/2021   HGBA1C 4.7 (L) 12/12/2020   Lab Results  Component Value Date   LDLCALC 90 06/11/2021   CREATININE 0.94 06/11/2021   Lab Results  Component Value Date   INSULIN 1.5 (L) 12/12/2020   INSULIN 4.0 04/04/2020   INSULIN 3.5 10/12/2019   INSULIN 6.9 08/02/2018   INSULIN 7.0 04/27/2018    3. Emotional eating [name] [name] reports quite a bit of emotional eating. She says she in unhappy but she says that she has felt this way in the past and it always passes. She denies suicidal ideas.   Assessment/Plan:   1. Drug-induced constipation [name] [name] may do Miralax 2 times a day. She was informed that a decrease in bowel movement frequency is normal while losing weight, but stools should not be hard or painful.   2. Type 2  diabetes mellitus with other specified complication, without long-term current use of insulin ([name] [name]) [name] [name] agrees to increase dose of Ozempic 1 mg weekly.  - Semaglutide, 1 MG/DOSE, (OZEMPIC, 1 MG/DOSE,) 4 MG/3ML SOPN; Inject 1 mg into the skin once a week.  Dispense: 3 mL; Refill: 0  3. Emotional eating I encouraged [name] [name] to begin counseling.  She is considering this.  [name] [name] agrees to start new prescription bupropion 150 mg every morning with no refills.  - buPROPion (WELLBUTRIN SR) 150 MG 12 hr tablet; Take 1 tablet (150 mg total) by mouth daily with breakfast.  Dispense: 30 tablet; Refill: 0  4. Obesity: Current BMI 31.72 [name] [name] is currently in the action stage of change. As such, her goal is to continue with weight loss efforts. She has agreed to the Category 2 Plan.   Exercise goals:  As is.  Behavioral modification strategies: decreasing simple carbohydrates.  [name] [name] has agreed to follow-up with our clinic in 4 weeks (virtual) .   Objective:   Blood pressure 123/71, pulse 70, temperature (!) 97.3 F (36.3 C), height _0  (1.6 m), weight 179 lb (81.2 kg), SpO2 93 %. Body mass index is 31.71 kg/m.  General: Cooperative, alert, well developed, in no acute distress. HEENT: Conjunctivae and lids unremarkable. Cardiovascular: Regular rhythm.  Lungs: Normal work of breathing. Neurologic: No focal deficits.   Lab Results  Component Value Date   CREATININE 0.94 06/11/2021   BUN 16  06/11/2021   NA 131 (L) 06/11/2021   K 5.1 06/11/2021   CL 92 (L) 06/11/2021   CO2 24 06/11/2021   Lab Results  Component Value Date   ALT 16 06/11/2021   AST 20 06/11/2021   ALKPHOS 65 06/11/2021   BILITOT 0.3 06/11/2021   Lab Results  Component Value Date   HGBA1C 5.2 10/23/2021   HGBA1C 4.8 06/11/2021   HGBA1C 4.7 (L) 12/12/2020   HGBA1C 4.8 04/04/2020   HGBA1C 4.6 (L) 10/12/2019   Lab Results  Component Value Date   INSULIN 1.5 (L) 12/12/2020   INSULIN 4.0 04/04/2020   INSULIN  3.5 10/12/2019   INSULIN 6.9 08/02/2018   INSULIN 7.0 04/27/2018   Lab Results  Component Value Date   TSH 1.930 06/11/2021   Lab Results  Component Value Date   CHOL 197 06/11/2021   HDL 93 06/11/2021   LDLCALC 90 06/11/2021   TRIG 77 06/11/2021   CHOLHDL 2.7 04/09/2016   Lab Results  Component Value Date   VD25OH 51.4 06/11/2021   VD25OH 45.3 12/12/2020   VD25OH 44.6 06/27/2020   Lab Results  Component Value Date   WBC 5.4 06/11/2021   HGB 11.6 06/11/2021   HCT 35.6 06/11/2021   MCV 89 06/11/2021   PLT 261 06/11/2021   Lab Results  Component Value Date   IRON 68 06/11/2021   TIBC 299 06/11/2021   FERRITIN 53 06/11/2021   Attestation Statements:   Reviewed by clinician on day of visit: allergies, medications, problem list, medical history, surgical history, family history, social history, and previous encounter notes.  I, Lizbeth Bark, RMA, am acting as Location manager for [name], [place].  I have reviewed the above documentation for accuracy and completeness, and I agree with the above. -  Georgianne Fick, FNP

## 2021-11-13 DIAGNOSIS — E785 Hyperlipidemia, unspecified: Secondary | ICD-10-CM | POA: Diagnosis not present

## 2021-11-13 DIAGNOSIS — G459 Transient cerebral ischemic attack, unspecified: Secondary | ICD-10-CM | POA: Diagnosis not present

## 2021-11-26 DIAGNOSIS — H00022 Hordeolum internum right lower eyelid: Secondary | ICD-10-CM | POA: Diagnosis not present

## 2021-11-26 DIAGNOSIS — H00025 Hordeolum internum left lower eyelid: Secondary | ICD-10-CM | POA: Diagnosis not present

## 2021-11-26 DIAGNOSIS — H02889 Meibomian gland dysfunction of unspecified eye, unspecified eyelid: Secondary | ICD-10-CM | POA: Diagnosis not present

## 2021-11-27 DIAGNOSIS — M1612 Unilateral primary osteoarthritis, left hip: Secondary | ICD-10-CM | POA: Diagnosis not present

## 2021-11-29 ENCOUNTER — Other Ambulatory Visit (INDEPENDENT_AMBULATORY_CARE_PROVIDER_SITE_OTHER): Payer: Self-pay | Admitting: Family Medicine

## 2021-11-29 DIAGNOSIS — F5089 Other specified eating disorder: Secondary | ICD-10-CM

## 2021-12-01 DIAGNOSIS — M7918 Myalgia, other site: Secondary | ICD-10-CM | POA: Diagnosis not present

## 2021-12-01 DIAGNOSIS — M9903 Segmental and somatic dysfunction of lumbar region: Secondary | ICD-10-CM | POA: Diagnosis not present

## 2021-12-01 DIAGNOSIS — M9904 Segmental and somatic dysfunction of sacral region: Secondary | ICD-10-CM | POA: Diagnosis not present

## 2021-12-01 DIAGNOSIS — M25552 Pain in left hip: Secondary | ICD-10-CM | POA: Diagnosis not present

## 2021-12-01 DIAGNOSIS — M9905 Segmental and somatic dysfunction of pelvic region: Secondary | ICD-10-CM | POA: Diagnosis not present

## 2021-12-01 DIAGNOSIS — M545 Low back pain, unspecified: Secondary | ICD-10-CM | POA: Diagnosis not present

## 2021-12-01 NOTE — Telephone Encounter (Signed)
Medication request.

## 2021-12-01 NOTE — Progress Notes (Unsigned)
TeleHealth Visit:  Due to the COVID-19 pandemic, this visit was completed with telemedicine (audio/video) technology to reduce patient and provider exposure as well as to preserve personal protective equipment.   [name] [name] has verbally consented to this TeleHealth visit. The patient is located at home, the provider is located at home. The participants in this visit include the listed provider and patient. The visit was conducted today via MyChart video.  OBESITY [name] [name] is here to discuss her progress with her obesity treatment plan along with follow-up of her obesity related diagnoses.   Today's date: 12/01/2021 Today's visit was # 54 Starting weight: 258 lbs Starting date: 05/07/2018 Total weight loss: *** lbs Weight at last in office visit: 179 lbs Today's reported weight: *** lbs No weight reported. Weight change since last visit: ***  Today's visit was #: 35 Starting weight: 258 lbs Starting date: 04/27/2018 Today's weight: 179 lbs Today's date: 11/06/2021 Total lbs lost to date: 79 lbs Total lbs lost since last in-office visit: 0  Interim History: ***  Nutrition Plan: the Category 2 Plan.  Hunger is {EWCONTROLASSESSMENT:24261}. Cravings are {EWCONTROLASSESSMENT:24261}.  Current exercise: {exercise types:16438} Assessment/Plan:  *** Obesity: Current BMI *** [name] [name] {CHL AMB IS/IS NOT:210130109} currently in the action stage of change. As such, her goal is to {MWMwtloss#1:210800005}. She has agreed to {MWMwtlossportion/plan2:23431}.   Exercise goals: {MWM EXERCISE RECS:23473} Continue exercise: {exercise types:16438}  Behavioral modification strategies: {MWMwtlossdietstrategies3:23432}.  [name] [name] has agreed to follow-up with our clinic in {NUMBER 1-10:22536} weeks.   No orders of the defined types were placed in this encounter.   There are no discontinued medications.   No orders of the defined types were placed in this encounter.     Objective:   VITALS: Per  patient if applicable, see vitals. GENERAL: Alert and in no acute distress. CARDIOPULMONARY: No increased WOB. Speaking in clear sentences.  PSYCH: Pleasant and cooperative. Speech normal rate and rhythm. Affect is appropriate. Insight and judgement are appropriate. Attention is focused, linear, and appropriate.  NEURO: Oriented as arrived to appointment on time with no prompting.   Lab Results  Component Value Date   CREATININE 0.94 06/11/2021   BUN 16 06/11/2021   NA 131 (L) 06/11/2021   K 5.1 06/11/2021   CL 92 (L) 06/11/2021   CO2 24 06/11/2021   Lab Results  Component Value Date   ALT 16 06/11/2021   AST 20 06/11/2021   ALKPHOS 65 06/11/2021   BILITOT 0.3 06/11/2021   Lab Results  Component Value Date   HGBA1C 5.2 10/23/2021   HGBA1C 4.8 06/11/2021   HGBA1C 4.7 (L) 12/12/2020   HGBA1C 4.8 04/04/2020   HGBA1C 4.6 (L) 10/12/2019   Lab Results  Component Value Date   INSULIN 1.5 (L) 12/12/2020   INSULIN 4.0 04/04/2020   INSULIN 3.5 10/12/2019   INSULIN 6.9 08/02/2018   INSULIN 7.0 04/27/2018   Lab Results  Component Value Date   TSH 1.930 06/11/2021   Lab Results  Component Value Date   CHOL 197 06/11/2021   HDL 93 06/11/2021   LDLCALC 90 06/11/2021   TRIG 77 06/11/2021   CHOLHDL 2.7 04/09/2016   Lab Results  Component Value Date   WBC 5.4 06/11/2021   HGB 11.6 06/11/2021   HCT 35.6 06/11/2021   MCV 89 06/11/2021   PLT 261 06/11/2021   Lab Results  Component Value Date   IRON 68 06/11/2021   TIBC 299 06/11/2021   FERRITIN 53 06/11/2021   Lab Results  Component Value  Date   VD25OH 51.4 06/11/2021   VD25OH 45.3 12/12/2020   VD25OH 44.6 06/27/2020    Attestation Statements:   Reviewed by clinician on day of visit: allergies, medications, problem list, medical history, surgical history, family history, social history, and previous encounter notes.  ***(delete if time-based billing not used)Time spent on visit including pre-visit chart review  and post-visit charting and care was *** minutes.

## 2021-12-02 ENCOUNTER — Encounter (INDEPENDENT_AMBULATORY_CARE_PROVIDER_SITE_OTHER): Payer: Self-pay | Admitting: Family Medicine

## 2021-12-02 ENCOUNTER — Telehealth (INDEPENDENT_AMBULATORY_CARE_PROVIDER_SITE_OTHER): Payer: Medicare Other | Admitting: Family Medicine

## 2021-12-02 DIAGNOSIS — E1169 Type 2 diabetes mellitus with other specified complication: Secondary | ICD-10-CM | POA: Diagnosis not present

## 2021-12-02 DIAGNOSIS — F5089 Other specified eating disorder: Secondary | ICD-10-CM

## 2021-12-02 DIAGNOSIS — E669 Obesity, unspecified: Secondary | ICD-10-CM

## 2021-12-02 DIAGNOSIS — Z6832 Body mass index (BMI) 32.0-32.9, adult: Secondary | ICD-10-CM

## 2021-12-02 MED ORDER — BUPROPION HCL ER (SR) 150 MG PO TB12: 150.0000 mg | ORAL_TABLET | Freq: Two times a day (BID) | ORAL | 0 refills | Status: DC

## 2021-12-04 DIAGNOSIS — R011 Cardiac murmur, unspecified: Secondary | ICD-10-CM | POA: Diagnosis not present

## 2021-12-04 DIAGNOSIS — I359 Nonrheumatic aortic valve disorder, unspecified: Secondary | ICD-10-CM | POA: Diagnosis not present

## 2021-12-09 DIAGNOSIS — M1612 Unilateral primary osteoarthritis, left hip: Secondary | ICD-10-CM | POA: Diagnosis not present

## 2021-12-16 NOTE — Progress Notes (Signed)
?TeleHealth Visit:  ?Due to the COVID-19 pandemic, this visit was completed with telemedicine (audio/video) technology to reduce patient and provider exposure as well as to preserve personal protective equipment.  ? ?[name] [name] has verbally consented to this TeleHealth visit. The patient is located at home, the provider is located at home. The participants in this visit include the listed provider and patient. The visit was conducted today via MyChart video. ? ?OBESITY ?[name] [name] is here to discuss her progress with her obesity treatment plan along with follow-up of her obesity related diagnoses.  ? ?Today's visit was # 58 ?Starting weight: 258 lbs ?Starting date: 05/07/2018 ?Total weight loss: 81  lbs ?Weight at last virtual visit: 185 lbs ?Today's reported weight: 177 lbs  ?Weight change since last visit: - 8 ?Weight goal is 165 lbs. ? ?Nutrition Plan: the Category 2 Plan.  ?Hunger is moderately controlled. Cravings are moderately controlled.  ?Current exercise: cardiovascular workout on exercise equipment and weightlifting. 5-7 days per week. ? ?Interim History: [name] [name] has done well over the past few weeks and feels she has gotten her "mojo" back as far as adhering to the meal plan.  She is getting an adequate protein and has moderated carbohydrate intake. ?Ozempic seems to be helping with appetite but is causing severe constipation. ? ?Assessment/Plan:  ?1. Type II Diabetes ?HgbA1c is not at goal. ?Any episodes of hypoglycemia? no ?Medication(s): Has titrated up to 0.75 mg of Ozempic. Has nausea and constipation. ? ?Lab Results  ?Component Value Date  ? HGBA1C 5.2 10/23/2021  ? HGBA1C 4.8 06/11/2021  ? HGBA1C 4.7 (L) 12/12/2020  ? ?Lab Results  ?Component Value Date  ? [place] 90 06/11/2021  ? CREATININE 0.94 06/11/2021  ? ? ?Plan: ?Continue Ozempic at 0.75 mg weekly until nausea subsides. May titrate to 1.0 mg.  ? ?2. Drug-induced constipation ?She has severe constipation due to Ozempic despite using MiraLAX  daily.  Water intake is somewhat low at 40 ounces per day. ? ?Plan: ?New prescription for Linzess 72 mcg daily. ?Increase water intake. ?May take MiraLAX twice daily until she receives Linzess. ? ?3. Hypertension associated with diabetes ?Hypertension ?Hypertension reasonably well controlled.  ?Medication(s): HCTZ 25 mg daily and losartan 100 mg daily. ?Home blood pressure reading was 147/79 today.  She says she seems to get high readings at home and better readings at office visits. ? ?BP Readings from Last 3 Encounters:  ?11/06/21 123/71  ?10/23/21 130/73  ?10/07/21 (!) 150/76  ? ?Lab Results  ?Component Value Date  ? CREATININE 0.94 06/11/2021  ? CREATININE 0.67 12/12/2020  ? CREATININE 0.73 06/27/2020  ? ? ?Plan: ?Current treatment plan is effective, no change in therapy.. ? ?4. Obesity: Current BMI 31.33 ?[name] [name] is currently in the action stage of change. As such, her goal is to continue with weight loss efforts. She has agreed to the Category 2 Plan.  ? ?Exercise goals: Continue current exercise regimen. ? ?Behavioral modification strategies: increasing water intake. ? ?[name] [name] has agreed to follow-up with our clinic in 2 weeks.  ? ?No orders of the defined types were placed in this encounter. ? ? ?There are no discontinued medications.  ? ?No orders of the defined types were placed in this encounter. ?   ? ?Objective:  ? ?VITALS: Per patient if applicable, see vitals. ?GENERAL: Alert and in no acute distress. ?CARDIOPULMONARY: No increased WOB. Speaking in clear sentences.  ?PSYCH: Pleasant and cooperative. Speech normal rate and rhythm. Affect is appropriate. Insight and judgement are appropriate. Attention is  focused, linear, and appropriate.  ?NEURO: Oriented as arrived to appointment on time with no prompting.  ? ?Lab Results  ?Component Value Date  ? CREATININE 0.94 06/11/2021  ? BUN 16 06/11/2021  ? NA 131 (L) 06/11/2021  ? K 5.1 06/11/2021  ? CL 92 (L) 06/11/2021  ? CO2 24 06/11/2021  ? ?Lab Results   ?Component Value Date  ? ALT 16 06/11/2021  ? AST 20 06/11/2021  ? ALKPHOS 65 06/11/2021  ? BILITOT 0.3 06/11/2021  ? ?Lab Results  ?Component Value Date  ? HGBA1C 5.2 10/23/2021  ? HGBA1C 4.8 06/11/2021  ? HGBA1C 4.7 (L) 12/12/2020  ? HGBA1C 4.8 04/04/2020  ? HGBA1C 4.6 (L) 10/12/2019  ? ?Lab Results  ?Component Value Date  ? INSULIN 1.5 (L) 12/12/2020  ? INSULIN 4.0 04/04/2020  ? INSULIN 3.5 10/12/2019  ? INSULIN 6.9 08/02/2018  ? INSULIN 7.0 04/27/2018  ? ?Lab Results  ?Component Value Date  ? TSH 1.930 06/11/2021  ? ?Lab Results  ?Component Value Date  ? CHOL 197 06/11/2021  ? HDL 93 06/11/2021  ? [place] 90 06/11/2021  ? TRIG 77 06/11/2021  ? CHOLHDL 2.7 04/09/2016  ? ?Lab Results  ?Component Value Date  ? WBC 5.4 06/11/2021  ? HGB 11.6 06/11/2021  ? HCT 35.6 06/11/2021  ? MCV 89 06/11/2021  ? PLT 261 06/11/2021  ? ?Lab Results  ?Component Value Date  ? IRON 68 06/11/2021  ? TIBC 299 06/11/2021  ? FERRITIN 53 06/11/2021  ? ?Lab Results  ?Component Value Date  ? VD25OH 51.4 06/11/2021  ? VD25OH 45.3 12/12/2020  ? VD25OH 44.6 06/27/2020  ? ? ?Attestation Statements:  ? ?Reviewed by clinician on day of visit: allergies, medications, problem list, medical history, surgical history, family history, social history, and previous encounter notes. ? ? ?

## 2021-12-17 ENCOUNTER — Telehealth (INDEPENDENT_AMBULATORY_CARE_PROVIDER_SITE_OTHER): Payer: Medicare Other | Admitting: Family Medicine

## 2021-12-17 ENCOUNTER — Encounter (INDEPENDENT_AMBULATORY_CARE_PROVIDER_SITE_OTHER): Payer: Self-pay | Admitting: Family Medicine

## 2021-12-17 DIAGNOSIS — E669 Obesity, unspecified: Secondary | ICD-10-CM | POA: Diagnosis not present

## 2021-12-17 DIAGNOSIS — E1169 Type 2 diabetes mellitus with other specified complication: Secondary | ICD-10-CM

## 2021-12-17 DIAGNOSIS — I152 Hypertension secondary to endocrine disorders: Secondary | ICD-10-CM | POA: Diagnosis not present

## 2021-12-17 DIAGNOSIS — Z6831 Body mass index (BMI) 31.0-31.9, adult: Secondary | ICD-10-CM | POA: Diagnosis not present

## 2021-12-17 DIAGNOSIS — E1159 Type 2 diabetes mellitus with other circulatory complications: Secondary | ICD-10-CM

## 2021-12-17 DIAGNOSIS — K5903 Drug induced constipation: Secondary | ICD-10-CM

## 2021-12-17 DIAGNOSIS — Z7985 Long-term (current) use of injectable non-insulin antidiabetic drugs: Secondary | ICD-10-CM | POA: Diagnosis not present

## 2021-12-17 MED ORDER — LINACLOTIDE 72 MCG PO CAPS: 72.0000 ug | ORAL_CAPSULE | Freq: Every day | ORAL | 0 refills | Status: DC

## 2021-12-18 ENCOUNTER — Other Ambulatory Visit (INDEPENDENT_AMBULATORY_CARE_PROVIDER_SITE_OTHER): Payer: Self-pay | Admitting: Family Medicine

## 2021-12-18 ENCOUNTER — Telehealth (INDEPENDENT_AMBULATORY_CARE_PROVIDER_SITE_OTHER): Payer: Self-pay | Admitting: Family Medicine

## 2021-12-18 ENCOUNTER — Encounter (INDEPENDENT_AMBULATORY_CARE_PROVIDER_SITE_OTHER): Payer: Self-pay

## 2021-12-18 DIAGNOSIS — K5903 Drug induced constipation: Secondary | ICD-10-CM

## 2021-12-18 DIAGNOSIS — M160 Bilateral primary osteoarthritis of hip: Secondary | ICD-10-CM | POA: Diagnosis not present

## 2021-12-18 DIAGNOSIS — M9906 Segmental and somatic dysfunction of lower extremity: Secondary | ICD-10-CM | POA: Diagnosis not present

## 2021-12-18 DIAGNOSIS — M9904 Segmental and somatic dysfunction of sacral region: Secondary | ICD-10-CM | POA: Diagnosis not present

## 2021-12-18 DIAGNOSIS — M9905 Segmental and somatic dysfunction of pelvic region: Secondary | ICD-10-CM | POA: Diagnosis not present

## 2021-12-18 DIAGNOSIS — M25552 Pain in left hip: Secondary | ICD-10-CM | POA: Diagnosis not present

## 2021-12-18 DIAGNOSIS — R531 Weakness: Secondary | ICD-10-CM | POA: Diagnosis not present

## 2021-12-18 MED ORDER — LUBIPROSTONE 8 MCG PO CAPS
8.0000 ug | ORAL_CAPSULE | Freq: Two times a day (BID) | ORAL | 0 refills | Status: DC
Start: 2021-12-18 — End: 2022-01-19

## 2021-12-18 NOTE — Telephone Encounter (Addendum)
Prior authorization denied for Linzess. Per insurance:  the use of this medication for treatment of constipation without it being chronic idiopathic constipation (CIC) or irritable  bowel syndrome with constipation (IBS-C) does not establish medical necessity for this drug. Patient sent denial message via mychart.  ?

## 2021-12-22 DIAGNOSIS — M9903 Segmental and somatic dysfunction of lumbar region: Secondary | ICD-10-CM | POA: Diagnosis not present

## 2021-12-22 DIAGNOSIS — M545 Low back pain, unspecified: Secondary | ICD-10-CM | POA: Diagnosis not present

## 2021-12-22 DIAGNOSIS — M7918 Myalgia, other site: Secondary | ICD-10-CM | POA: Diagnosis not present

## 2021-12-22 DIAGNOSIS — M9905 Segmental and somatic dysfunction of pelvic region: Secondary | ICD-10-CM | POA: Diagnosis not present

## 2021-12-22 DIAGNOSIS — M25552 Pain in left hip: Secondary | ICD-10-CM | POA: Diagnosis not present

## 2021-12-22 DIAGNOSIS — M9904 Segmental and somatic dysfunction of sacral region: Secondary | ICD-10-CM | POA: Diagnosis not present

## 2021-12-29 NOTE — Progress Notes (Signed)
?TeleHealth Visit:  ?Due to the COVID-19 pandemic, this visit was completed with telemedicine (audio/video) technology to reduce patient and provider exposure as well as to preserve personal protective equipment.  ? ?[name] [name] has verbally consented to this TeleHealth visit. The patient is located at home, the provider is located at home. The participants in this visit include the listed provider and patient. The visit was conducted today via MyChart video. ? ?OBESITY ?[name] [name] is here to discuss her progress with her obesity treatment plan along with follow-up of her obesity related diagnoses.  ? ?Today's visit was # 17 ?Starting weight: 258 lbs ?Starting date: 05/07/2018 ?Total weight loss: 79 ?Today's reported weight: 179 lbs. Was 177 lbs last week. ?Weight change since last visit: +2 ? ? ?Nutrition Plan: the Category 2 Plan.  ?Hunger is moderately controlled. Cravings are moderately controlled.  ?Current exercise: Cardiovascular workout on exercise equipment and weightlifting for 45 minutes. 5 days per week. ? ?Interim History: [name] [name] reports that she is snacking less and is noticing a small amount of appetite suppression with the Ozempic. ?She has been eating out some and still exceeding snack calorie allotment. ? ?She is scheduled for left hip replacement on May 19.  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 ? ?Assessment/Plan:  ?1. Type II Diabetes ?HgbA1c is at goal. ?CBGs: Not checking ?Any episodes of hypoglycemia? no ?Medication(s): She took her first injection of Ozempic 1 mg yesterday.  Dose was increased at last office visit.  She has some constipation but is managing this pretty well with MiraLAX. ? ?Lab Results  ?Component Value Date  ? HGBA1C 5.2 10/23/2021  ? HGBA1C 4.8 06/11/2021  ? HGBA1C 4.7 (L) 12/12/2020  ? ?Lab Results  ?Component Value Date  ? [place] 90 06/11/2021  ? CREATININE 0.94 06/11/2021  ? ? ?Plan: ?Continue Ozempic 1 mg weekly ? ?2. Hypertension ?Hypertension well  controlled.  Home blood pressure was 127/68 today and pulse was 76. ?Medication(s): Losartan 100 mg daily, HCTZ 25 mg daily. ? ?BP Readings from Last 3 Encounters:  ?12/30/21 127/68  ?11/06/21 123/71  ?10/23/21 130/73  ? ?Lab Results  ?Component Value Date  ? CREATININE 0.94 06/11/2021  ? CREATININE 0.67 12/12/2020  ? CREATININE 0.73 06/27/2020  ? ? ?Plan: ?Current treatment plan is effective, no change in therapy.[name] ?Continue both meds at same dose. ?Refill losartan. ? ?3. Emotional Eating ?Has not started 2nd dose of bupropion yet.  She is noticing a better, more stable mood since starting the bupropion.  She has not necessarily noticed a decrease in food cravings.  Denies side effects. ? ?Plan: ?She will begin taking bupropion 2 times per day. ? ?4. Drug induced constipation ?Has been dealing with constipation since starting Ozempic.  We try to get coverage for both Amitiza and Linzess but her insurance would not cover these.  However she is managing the constipation pretty well with MiraLAX currently. ? ?Plan: ?Continue MiraLAX.  May take up to twice daily. ? ?5. Obesity: Current BMI 31.72 ?[name] [name] is currently in the action stage of change. As such, her goal is to continue with weight loss efforts.  ?She has agreed to the Category 2 Plan.  ? ?Exercise goals:  Continue current exercise regimen. ? ?Behavioral modification strategies: decreasing simple carbohydrates. ? ?[name] [name] has agreed to follow-up with our clinic in 3 weeks.  ? ?No orders of the defined types were placed in this encounter. ? ? ?Medications Discontinued During This Encounter  ?Medication Reason  ? losartan (  COZAAR) 100 MG tablet Reorder  ?  ? ?Meds ordered this encounter  ?Medications  ? losartan (COZAAR) 100 MG tablet  ?  Sig: Take 1 tablet (100 mg total) by mouth daily.  ?  Dispense:  90 tablet  ?  Refill:  0  ?  Order Specific Question:   Supervising Provider  ?  Answer:   Dennard Nip D [[id number]]  ?   ? ?Objective:  ? ?VITALS: Per patient if  applicable, see vitals. ?GENERAL: Alert and in no acute distress. ?CARDIOPULMONARY: No increased WOB. Speaking in clear sentences.  ?PSYCH: Pleasant and cooperative. Speech normal rate and rhythm. Affect is appropriate. Insight and judgement are appropriate. Attention is focused, linear, and appropriate.  ?NEURO: Oriented as arrived to appointment on time with no prompting.  ? ?Lab Results  ?Component Value Date  ? CREATININE 0.94 06/11/2021  ? BUN 16 06/11/2021  ? NA 131 (L) 06/11/2021  ? K 5.1 06/11/2021  ? CL 92 (L) 06/11/2021  ? CO2 24 06/11/2021  ? ?Lab Results  ?Component Value Date  ? ALT 16 06/11/2021  ? AST 20 06/11/2021  ? ALKPHOS 65 06/11/2021  ? BILITOT 0.3 06/11/2021  ? ?Lab Results  ?Component Value Date  ? HGBA1C 5.2 10/23/2021  ? HGBA1C 4.8 06/11/2021  ? HGBA1C 4.7 (L) 12/12/2020  ? HGBA1C 4.8 04/04/2020  ? HGBA1C 4.6 (L) 10/12/2019  ? ?Lab Results  ?Component Value Date  ? INSULIN 1.5 (L) 12/12/2020  ? INSULIN 4.0 04/04/2020  ? INSULIN 3.5 10/12/2019  ? INSULIN 6.9 08/02/2018  ? INSULIN 7.0 04/27/2018  ? ?Lab Results  ?Component Value Date  ? TSH 1.930 06/11/2021  ? ?Lab Results  ?Component Value Date  ? CHOL 197 06/11/2021  ? HDL 93 06/11/2021  ? [place] 90 06/11/2021  ? TRIG 77 06/11/2021  ? CHOLHDL 2.7 04/09/2016  ? ?Lab Results  ?Component Value Date  ? WBC 5.4 06/11/2021  ? HGB 11.6 06/11/2021  ? HCT 35.6 06/11/2021  ? MCV 89 06/11/2021  ? PLT 261 06/11/2021  ? ?Lab Results  ?Component Value Date  ? IRON 68 06/11/2021  ? TIBC 299 06/11/2021  ? FERRITIN 53 06/11/2021  ? ?Lab Results  ?Component Value Date  ? VD25OH 51.4 06/11/2021  ? VD25OH 45.3 12/12/2020  ? VD25OH 44.6 06/27/2020  ? ? ?Attestation Statements:  ? ?Reviewed by clinician on day of visit: allergies, medications, problem list, medical history, surgical history, family history, social history, and previous encounter notes. ? ? ? ?

## 2021-12-30 ENCOUNTER — Encounter (INDEPENDENT_AMBULATORY_CARE_PROVIDER_SITE_OTHER): Payer: Self-pay | Admitting: Family Medicine

## 2021-12-30 ENCOUNTER — Telehealth (INDEPENDENT_AMBULATORY_CARE_PROVIDER_SITE_OTHER): Payer: Medicare Other | Admitting: Family Medicine

## 2021-12-30 VITALS — BP 127/68 | HR 76 | Ht 63.0 in | Wt 179.0 lb

## 2021-12-30 DIAGNOSIS — E669 Obesity, unspecified: Secondary | ICD-10-CM

## 2021-12-30 DIAGNOSIS — Z6831 Body mass index (BMI) 31.0-31.9, adult: Secondary | ICD-10-CM | POA: Diagnosis not present

## 2021-12-30 DIAGNOSIS — K5903 Drug induced constipation: Secondary | ICD-10-CM

## 2021-12-30 DIAGNOSIS — F5089 Other specified eating disorder: Secondary | ICD-10-CM | POA: Diagnosis not present

## 2021-12-30 DIAGNOSIS — I152 Hypertension secondary to endocrine disorders: Secondary | ICD-10-CM | POA: Diagnosis not present

## 2021-12-30 DIAGNOSIS — M79652 Pain in left thigh: Secondary | ICD-10-CM | POA: Diagnosis not present

## 2021-12-30 DIAGNOSIS — E1159 Type 2 diabetes mellitus with other circulatory complications: Secondary | ICD-10-CM

## 2021-12-30 DIAGNOSIS — M9904 Segmental and somatic dysfunction of sacral region: Secondary | ICD-10-CM | POA: Diagnosis not present

## 2021-12-30 DIAGNOSIS — M9903 Segmental and somatic dysfunction of lumbar region: Secondary | ICD-10-CM | POA: Diagnosis not present

## 2021-12-30 DIAGNOSIS — E1169 Type 2 diabetes mellitus with other specified complication: Secondary | ICD-10-CM

## 2021-12-30 DIAGNOSIS — M9906 Segmental and somatic dysfunction of lower extremity: Secondary | ICD-10-CM | POA: Diagnosis not present

## 2021-12-30 MED ORDER — LOSARTAN POTASSIUM 100 MG PO TABS: 100.0000 mg | ORAL_TABLET | Freq: Every day | ORAL | 0 refills | Status: DC

## 2022-01-12 ENCOUNTER — Other Ambulatory Visit (INDEPENDENT_AMBULATORY_CARE_PROVIDER_SITE_OTHER): Payer: Self-pay | Admitting: Family Medicine

## 2022-01-12 DIAGNOSIS — E785 Hyperlipidemia, unspecified: Secondary | ICD-10-CM | POA: Diagnosis not present

## 2022-01-12 DIAGNOSIS — E1169 Type 2 diabetes mellitus with other specified complication: Secondary | ICD-10-CM

## 2022-01-12 DIAGNOSIS — R011 Cardiac murmur, unspecified: Secondary | ICD-10-CM | POA: Diagnosis not present

## 2022-01-12 DIAGNOSIS — I10 Essential (primary) hypertension: Secondary | ICD-10-CM | POA: Diagnosis not present

## 2022-01-12 MED ORDER — SEMAGLUTIDE (2 MG/DOSE) 8 MG/3ML ~~LOC~~ SOPN
2.0000 mg | PEN_INJECTOR | SUBCUTANEOUS | 0 refills | Status: DC
Start: 2022-01-12 — End: 2022-03-18

## 2022-01-13 ENCOUNTER — Ambulatory Visit (INDEPENDENT_AMBULATORY_CARE_PROVIDER_SITE_OTHER): Payer: Medicare Other | Admitting: Neurology

## 2022-01-13 DIAGNOSIS — M25552 Pain in left hip: Secondary | ICD-10-CM | POA: Diagnosis not present

## 2022-01-13 DIAGNOSIS — M9905 Segmental and somatic dysfunction of pelvic region: Secondary | ICD-10-CM | POA: Diagnosis not present

## 2022-01-13 DIAGNOSIS — M62471 Contracture of muscle, right ankle and foot: Secondary | ICD-10-CM

## 2022-01-13 DIAGNOSIS — M9904 Segmental and somatic dysfunction of sacral region: Secondary | ICD-10-CM | POA: Diagnosis not present

## 2022-01-13 DIAGNOSIS — M9906 Segmental and somatic dysfunction of lower extremity: Secondary | ICD-10-CM | POA: Diagnosis not present

## 2022-01-13 DIAGNOSIS — G6289 Other specified polyneuropathies: Secondary | ICD-10-CM | POA: Diagnosis not present

## 2022-01-13 NOTE — Progress Notes (Addendum)
Botox consent signed  Botox- 200 units x 1 vial Lot: [id number] Expiration: 08/2024 NDC: 8677-3736-68  Bacteriostatic 0.9% Sodium Chloride- 59m total Lot: [id number]: 10/22/2022 NDC: [address]-37Dx: MD57.897B/B [id number] code and [id number] additional limb

## 2022-01-13 NOTE — Progress Notes (Signed)
01/13/2022: improved 50% we will see what happens with this injection ?11/19/2020: first injections ?  ?History: Refractory idiopathic peripheral diabetic polyneuropathy. Risk factors includes many years of prediabetes and diabetes . Hx of diabetic(per-diabetic) neuropathy.  ?  ?Procedure: All procedures documented were medically necessary, reasonable and appropriate based on the patient's history, medical diagnosis and physician opinion. Verbal informed consent was obtained from the patient, patient was informed of potential risk of procedure, including bruising, bleeding, hematoma formation, infection, muscle weakness, muscle pain, numbness, transient hypertension, transient hyperglycemia and transient insomnia among others. All areas injected were topically clean with isopropyl rubbing alcohol. Nonsterile nonlatex gloves were worn during the procedure. ?  ? Injections were given unilateral to the left plantar surface of the foot medial surface ask her to point out the area of pain.  The feet were properly sterilized with evenly spaced sites bilaterally injected each with 10 units of botox using a 1 ml 30-gauge needle.  200 units of Botox were used and none was wasted. ?  ?Dx: G62.89 ?SAMPLES TODAY ?(In the future if we continue: [phone] CPT code and no additional limb today but [id number] for additional limb) ? ?

## 2022-01-15 ENCOUNTER — Telehealth: Payer: Self-pay | Admitting: *Deleted

## 2022-01-15 NOTE — Progress Notes (Signed)
?TeleHealth Visit:  ?This visit was completed with telemedicine (audio/video) technology. ?[name] [name] has verbally consented to this TeleHealth visit. The patient is located at home, the provider is located at home. The participants in this visit include the listed provider and patient. The visit was conducted today via MyChart video. ? ?OBESITY ?[name] [name] is here to discuss her progress with her obesity treatment plan along with follow-up of her obesity related diagnoses.  ? ?Today's visit was # 75 ?Starting weight: 258 lbs ?Starting date: 05/07/2018 ?Total weight loss: 83 lbs  ?Today's reported weight: 175.1 lbs  ?Weight change since last visit: -4 ? ?Nutrition Plan: the Category 2 Plan 65%. ?Hunger is moderately controlled. Cravings are moderately controlled.  ?Current exercise: Cardiovascular workout on exercise equipment for 30 minutes and weightlifting for 20-30 minutes. 5 days per week. ? ?Interim History: [name] [name] is doing very well with weight loss and is down 4 pounds since her last virtual visit.  She hit a low weight of 149 pounds in September 2021 but gradually regained some weight after that. 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 ? ?She will be having another revision of her hip on May 19. ?Assessment/Plan:  ?1. Type II Diabetes ?HgbA1c is at goal. ?CBGs:Not checking ?Any episodes of hypoglycemia? no ?Medication(s): Ozempic 1 mg weekly.  She does have some issues with constipation which she is able to manage with MiraLAX. ? ?Lab Results  ?Component Value Date  ? HGBA1C 5.2 10/23/2021  ? HGBA1C 4.8 06/11/2021  ? HGBA1C 4.7 (L) 12/12/2020  ? ?Lab Results  ?Component Value Date  ? [place] 90 06/11/2021  ? CREATININE 0.94 06/11/2021  ? ? ?Plan: ?At last refill I sent the 2 mg dose pen of Ozempic.  I advised her to increase dose to 1.5 mg (57 clicks) next week and then 2 mg the week after that. ? ?2. Other depression with  emotional eating ?Medication: Bupropion 150 mg twice daily.   ?She is very happy with her her mood stability since starting to take bupropion.  She also feels like this helps control her food cravings.  When she takes her second dose at suppertime she has trouble sleeping. ? ?Plan: ?Continue bupropion 150 mg twice daily.  She will set an alarm to remind her to take the second dose in early afternoon. ? ? ? ?3. Hypertension ?Hypertension well controlled.  Home blood pressure reading today was 122/71. ?Medication(s): Losartan/HCTZ 100/25 mg daily ? ? ?BP Readings from Last 3 Encounters:  ?12/30/21 127/68  ?11/06/21 123/71  ?10/23/21 130/73  ? ?Lab Results  ?Component Value Date  ? CREATININE 0.94 06/11/2021  ? CREATININE 0.67 12/12/2020  ? CREATININE 0.73 06/27/2020  ? ? ?Plan: ?Current treatment plan is effective, no change in therapy.[name] ?Continue losartan/HCTZ 100/25 mg daily. ? ? ?4. Obesity: Current BMI 31.02 ?[name] [name] is currently in the action stage of change. As such, her goal is to continue with weight loss efforts.  ?She has agreed to the Category 2 Plan.  ? ?Exercise goals:  Continue current exercise regimen. ? ?Behavioral modification strategies: decreasing simple carbohydrates. ? ?[name] has agreed to follow-up with our clinic in 2 weeks.  ? ?No orders of the defined types were placed in this encounter. ? ? ?Medications Discontinued During This Encounter  ?Medication Reason  ? losartan (COZAAR) 100 MG tablet Change in therapy  ? lubiprostone (AMITIZA) 8 MCG capsule Cost of medication  ? hydrochlorothiazide (HYDRODIURIL) 25  MG tablet Dose change  ?  ? ?No orders of the defined types were placed in this encounter. ?   ? ?Objective:  ? ?VITALS: Per patient if applicable, see vitals. ?GENERAL: Alert and in no acute distress. ?CARDIOPULMONARY: No increased WOB. Speaking in clear sentences.  ?PSYCH: Pleasant and cooperative. Speech normal rate and rhythm. Affect is appropriate. Insight and judgement are  appropriate. Attention is focused, linear, and appropriate.  ?NEURO: Oriented as arrived to appointment on time with no prompting.  ? ?Lab Results  ?Component Value Date  ? CREATININE 0.94 06/11/2021  ? BUN 16 06/11/2021  ? NA 131 (L) 06/11/2021  ? K 5.1 06/11/2021  ? CL 92 (L) 06/11/2021  ? CO2 24 06/11/2021  ? ?Lab Results  ?Component Value Date  ? ALT 16 06/11/2021  ? AST 20 06/11/2021  ? ALKPHOS 65 06/11/2021  ? BILITOT 0.3 06/11/2021  ? ?Lab Results  ?Component Value Date  ? HGBA1C 5.2 10/23/2021  ? HGBA1C 4.8 06/11/2021  ? HGBA1C 4.7 (L) 12/12/2020  ? HGBA1C 4.8 04/04/2020  ? HGBA1C 4.6 (L) 10/12/2019  ? ?Lab Results  ?Component Value Date  ? INSULIN 1.5 (L) 12/12/2020  ? INSULIN 4.0 04/04/2020  ? INSULIN 3.5 10/12/2019  ? INSULIN 6.9 08/02/2018  ? INSULIN 7.0 04/27/2018  ? ?Lab Results  ?Component Value Date  ? TSH 1.930 06/11/2021  ? ?Lab Results  ?Component Value Date  ? CHOL 197 06/11/2021  ? HDL 93 06/11/2021  ? [place] 90 06/11/2021  ? TRIG 77 06/11/2021  ? CHOLHDL 2.7 04/09/2016  ? ?Lab Results  ?Component Value Date  ? WBC 5.4 06/11/2021  ? HGB 11.6 06/11/2021  ? HCT 35.6 06/11/2021  ? MCV 89 06/11/2021  ? PLT 261 06/11/2021  ? ?Lab Results  ?Component Value Date  ? IRON 68 06/11/2021  ? TIBC 299 06/11/2021  ? FERRITIN 53 06/11/2021  ? ?Lab Results  ?Component Value Date  ? VD25OH 51.4 06/11/2021  ? VD25OH 45.3 12/12/2020  ? VD25OH 44.6 06/27/2020  ? ? ?Attestation Statements:  ? ?Reviewed by clinician on day of visit: allergies, medications, problem list, medical history, surgical history, family history, social history, and previous encounter notes. ? ?Time spent on visit including pre-visit chart review and post-visit charting and care was 31 minutes.  ? ? ?

## 2022-01-15 NOTE — Telephone Encounter (Signed)
Received clearance request form from [facility] requesting neurology clearance for left anterior hip arthroplasty under spinal anesthesia. Dr Jaynee Eagles signed form indicating patient is at low risk and is cleared from a neurologic perspective. Also noted on form that patient is on Xarelto as FYI to the Psychologist, sport and exercise. Form has been faxed back to [facility] along with last office note from 10/06/21. Received a receipt of confirmation. ? ?

## 2022-01-16 ENCOUNTER — Encounter: Payer: Self-pay | Admitting: Neurology

## 2022-01-19 ENCOUNTER — Encounter (INDEPENDENT_AMBULATORY_CARE_PROVIDER_SITE_OTHER): Payer: Self-pay | Admitting: Family Medicine

## 2022-01-19 ENCOUNTER — Telehealth (INDEPENDENT_AMBULATORY_CARE_PROVIDER_SITE_OTHER): Payer: Medicare Other | Admitting: Family Medicine

## 2022-01-19 VITALS — Ht 63.0 in | Wt 175.1 lb

## 2022-01-19 DIAGNOSIS — E1159 Type 2 diabetes mellitus with other circulatory complications: Secondary | ICD-10-CM | POA: Diagnosis not present

## 2022-01-19 DIAGNOSIS — E669 Obesity, unspecified: Secondary | ICD-10-CM | POA: Diagnosis not present

## 2022-01-19 DIAGNOSIS — Z6831 Body mass index (BMI) 31.0-31.9, adult: Secondary | ICD-10-CM | POA: Diagnosis not present

## 2022-01-19 DIAGNOSIS — F3289 Other specified depressive episodes: Secondary | ICD-10-CM

## 2022-01-19 DIAGNOSIS — E1169 Type 2 diabetes mellitus with other specified complication: Secondary | ICD-10-CM

## 2022-01-19 DIAGNOSIS — Z7985 Long-term (current) use of injectable non-insulin antidiabetic drugs: Secondary | ICD-10-CM | POA: Diagnosis not present

## 2022-01-19 DIAGNOSIS — I152 Hypertension secondary to endocrine disorders: Secondary | ICD-10-CM

## 2022-01-22 DIAGNOSIS — Z79899 Other long term (current) drug therapy: Secondary | ICD-10-CM | POA: Diagnosis not present

## 2022-01-22 DIAGNOSIS — Z01818 Encounter for other preprocedural examination: Secondary | ICD-10-CM | POA: Diagnosis not present

## 2022-01-22 DIAGNOSIS — E785 Hyperlipidemia, unspecified: Secondary | ICD-10-CM | POA: Diagnosis not present

## 2022-01-22 DIAGNOSIS — N1831 Chronic kidney disease, stage 3a: Secondary | ICD-10-CM | POA: Diagnosis not present

## 2022-01-22 DIAGNOSIS — E1169 Type 2 diabetes mellitus with other specified complication: Secondary | ICD-10-CM | POA: Diagnosis not present

## 2022-01-29 DIAGNOSIS — Z85528 Personal history of other malignant neoplasm of kidney: Secondary | ICD-10-CM | POA: Diagnosis not present

## 2022-01-29 DIAGNOSIS — M25552 Pain in left hip: Secondary | ICD-10-CM | POA: Diagnosis not present

## 2022-01-29 DIAGNOSIS — Z08 Encounter for follow-up examination after completed treatment for malignant neoplasm: Secondary | ICD-10-CM | POA: Diagnosis not present

## 2022-01-29 DIAGNOSIS — Z905 Acquired absence of kidney: Secondary | ICD-10-CM | POA: Diagnosis not present

## 2022-01-29 DIAGNOSIS — Z87891 Personal history of nicotine dependence: Secondary | ICD-10-CM | POA: Diagnosis not present

## 2022-01-29 DIAGNOSIS — M1612 Unilateral primary osteoarthritis, left hip: Secondary | ICD-10-CM | POA: Diagnosis not present

## 2022-01-29 DIAGNOSIS — C641 Malignant neoplasm of right kidney, except renal pelvis: Secondary | ICD-10-CM | POA: Diagnosis not present

## 2022-02-03 DIAGNOSIS — M9904 Segmental and somatic dysfunction of sacral region: Secondary | ICD-10-CM | POA: Diagnosis not present

## 2022-02-03 DIAGNOSIS — M9905 Segmental and somatic dysfunction of pelvic region: Secondary | ICD-10-CM | POA: Diagnosis not present

## 2022-02-03 DIAGNOSIS — M25552 Pain in left hip: Secondary | ICD-10-CM | POA: Diagnosis not present

## 2022-02-03 DIAGNOSIS — M1612 Unilateral primary osteoarthritis, left hip: Secondary | ICD-10-CM | POA: Diagnosis not present

## 2022-02-03 DIAGNOSIS — M9906 Segmental and somatic dysfunction of lower extremity: Secondary | ICD-10-CM | POA: Diagnosis not present

## 2022-02-03 NOTE — Progress Notes (Signed)
?TeleHealth Visit:  ?This visit was completed with telemedicine (audio/video) technology. ?[name] [name] has verbally consented to this TeleHealth visit. The patient is located at home, the provider is located at home. The participants in this visit include the listed provider and patient. The visit was conducted today via MyChart video. ? ?OBESITY ?[name] [name] is here to discuss her progress with her obesity treatment plan along with follow-up of her obesity related diagnoses.  ? ?Today's visit was # 68 ?Starting weight: 258 lbs ?Starting date: 05/07/2018 ?Total weight loss: 82 lbs  ?Reported weight last virtual visit: 175 lbs ?Today's reported weight: 176 lbs  ?Weight change since last visit: gained 1 lb.  ?Home BP today 121/69.  Pulse 79. ? ?Nutrition Plan: the Category 2 Plan- 50% of time. ?Hunger is moderately controlled. Cravings are moderately controlled.  ?Current exercise: riding exercise bike and arm machines at gym 5 days per week ? ?Interim History: She has not adhered well to plan in the past few weeks.   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  She has been unable to do the strength training she usually does at the gym due to her hip issues.  She is looking forward to getting back to her usual exercise routine which she feels will also help her get on track with her eating. ?Her goal is to get her BMI less than 30 again (169 pounds). ?She feels the Ozempic has helped reduce her urge to snack. ? ?Assessment/Plan:  ?1. Type II Diabetes ?HgbA1c is at goal.  A1c done by PCP on May 4 was 4.8. ?CBGs:Not checking ?Episodes of hypoglycemia? no ?Medication(s): Ozempic 2 mg weekly.  She feels this reduces her snacking. ? ?Lab Results  ?Component Value Date  ? HGBA1C 5.2 10/23/2021  ? HGBA1C 4.8 06/11/2021  ? HGBA1C 4.7 (L) 12/12/2020  ? ?Lab Results  ?Component Value Date  ? [place] 90 06/11/2021  ? CREATININE 0.94 06/11/2021  ? ? ?Plan: ?Continue Ozempic 2 mg weekly. ? ?2. Eating  disorder/emotional eating ?[name] [name] has had issues with stress/emotional eating.  Recently she feels she is snacking more than she should. ?Currently this is moderately controlled. Overall mood is stable.  She feels bupropion has been quite beneficial for her mood.  Denies suicidal/homicidal ideation. ?Medication(s): Bupropion 150 mg twice daily.  The evening dose causes insomnia if she takes it too late. ? ?Plan: ?Increase dose of bupropion.  Take 200 mg in the morning and 150 mg in the afternoon for a few days.  Increase to 200 mg twice daily if she is tolerating well. ?Refill: ?buPROPion (WELLBUTRIN SR) 200 MG 12 hr tablet  ? Sig: Take 1 tablet (200 mg total) by mouth 2 (two) times daily.  ? Dispense:  60 tablet  ? Refill:  0  ? ? ? ?3. Obesity: Current BMI 31.18 ?[name] [name] is currently in the action stage of change. As such, her goal is to continue with weight loss efforts.  ?She has agreed to the Category 2 Plan.  ? ?Exercise goals: Continue current regimen until surgery on May 19.  After that progress activity as tolerated. ? ?Behavioral modification strategies: decreasing simple carbohydrates. ? ?[name] [name] has agreed to follow-up with our clinic in 4 weeks.  ? ?No orders of the defined types were placed in this encounter. ? ? ?Medications Discontinued During This Encounter  ?Medication Reason  ? buPROPion (WELLBUTRIN SR) 150 MG 12 hr tablet Dose change  ? Cholecalciferol (VITAMIN D3) 250 MCG (10000 UT) capsule  Patient Preference  ?  ? ?Meds ordered this encounter  ?Medications  ? buPROPion (WELLBUTRIN SR) 200 MG 12 hr tablet  ?  Sig: Take 1 tablet (200 mg total) by mouth 2 (two) times daily.  ?  Dispense:  60 tablet  ?  Refill:  0  ?  Order Specific Question:   Supervising Provider  ?  Answer:   Dennard Nip D [[id number]]  ?   ? ?Objective:  ? ?VITALS: Per patient if applicable, see vitals. ?GENERAL: Alert and in no acute distress. ?CARDIOPULMONARY: No increased WOB. Speaking in clear sentences.  ?PSYCH: Pleasant and  cooperative. Speech normal rate and rhythm. Affect is appropriate. Insight and judgement are appropriate. Attention is focused, linear, and appropriate.  ?NEURO: Oriented as arrived to appointment on time with no prompting.  ? ?Lab Results  ?Component Value Date  ? CREATININE 0.94 06/11/2021  ? BUN 16 06/11/2021  ? NA 131 (L) 06/11/2021  ? K 5.1 06/11/2021  ? CL 92 (L) 06/11/2021  ? CO2 24 06/11/2021  ? ?Lab Results  ?Component Value Date  ? ALT 16 06/11/2021  ? AST 20 06/11/2021  ? ALKPHOS 65 06/11/2021  ? BILITOT 0.3 06/11/2021  ? ?Lab Results  ?Component Value Date  ? HGBA1C 5.2 10/23/2021  ? HGBA1C 4.8 06/11/2021  ? HGBA1C 4.7 (L) 12/12/2020  ? HGBA1C 4.8 04/04/2020  ? HGBA1C 4.6 (L) 10/12/2019  ? ?Lab Results  ?Component Value Date  ? INSULIN 1.5 (L) 12/12/2020  ? INSULIN 4.0 04/04/2020  ? INSULIN 3.5 10/12/2019  ? INSULIN 6.9 08/02/2018  ? INSULIN 7.0 04/27/2018  ? ?Lab Results  ?Component Value Date  ? TSH 1.930 06/11/2021  ? ?Lab Results  ?Component Value Date  ? CHOL 197 06/11/2021  ? HDL 93 06/11/2021  ? [place] 90 06/11/2021  ? TRIG 77 06/11/2021  ? CHOLHDL 2.7 04/09/2016  ? ?Lab Results  ?Component Value Date  ? WBC 5.4 06/11/2021  ? HGB 11.6 06/11/2021  ? HCT 35.6 06/11/2021  ? MCV 89 06/11/2021  ? PLT 261 06/11/2021  ? ?Lab Results  ?Component Value Date  ? IRON 68 06/11/2021  ? TIBC 299 06/11/2021  ? FERRITIN 53 06/11/2021  ? ?Lab Results  ?Component Value Date  ? VD25OH 51.4 06/11/2021  ? VD25OH 45.3 12/12/2020  ? VD25OH 44.6 06/27/2020  ? ? ?Attestation Statements:  ? ?Reviewed by clinician on day of visit: allergies, medications, problem list, medical history, surgical history, family history, social history, and previous encounter notes. ? ?

## 2022-02-04 ENCOUNTER — Encounter (INDEPENDENT_AMBULATORY_CARE_PROVIDER_SITE_OTHER): Payer: Self-pay | Admitting: Family Medicine

## 2022-02-04 ENCOUNTER — Telehealth (INDEPENDENT_AMBULATORY_CARE_PROVIDER_SITE_OTHER): Payer: Medicare Other | Admitting: Family Medicine

## 2022-02-04 VITALS — Ht 63.0 in | Wt 176.0 lb

## 2022-02-04 DIAGNOSIS — E669 Obesity, unspecified: Secondary | ICD-10-CM | POA: Diagnosis not present

## 2022-02-04 DIAGNOSIS — F3289 Other specified depressive episodes: Secondary | ICD-10-CM

## 2022-02-04 DIAGNOSIS — Z7985 Long-term (current) use of injectable non-insulin antidiabetic drugs: Secondary | ICD-10-CM

## 2022-02-04 DIAGNOSIS — Z6831 Body mass index (BMI) 31.0-31.9, adult: Secondary | ICD-10-CM

## 2022-02-04 DIAGNOSIS — E1169 Type 2 diabetes mellitus with other specified complication: Secondary | ICD-10-CM | POA: Diagnosis not present

## 2022-02-04 MED ORDER — BUPROPION HCL ER (SR) 200 MG PO TB12: 200.0000 mg | ORAL_TABLET | Freq: Two times a day (BID) | ORAL | 0 refills | Status: DC

## 2022-02-06 DIAGNOSIS — Z96642 Presence of left artificial hip joint: Secondary | ICD-10-CM | POA: Diagnosis not present

## 2022-02-06 DIAGNOSIS — M1612 Unilateral primary osteoarthritis, left hip: Secondary | ICD-10-CM | POA: Diagnosis not present

## 2022-02-09 DIAGNOSIS — R531 Weakness: Secondary | ICD-10-CM | POA: Diagnosis not present

## 2022-02-09 DIAGNOSIS — Z96642 Presence of left artificial hip joint: Secondary | ICD-10-CM | POA: Diagnosis not present

## 2022-02-09 DIAGNOSIS — M25652 Stiffness of left hip, not elsewhere classified: Secondary | ICD-10-CM | POA: Diagnosis not present

## 2022-02-09 DIAGNOSIS — R262 Difficulty in walking, not elsewhere classified: Secondary | ICD-10-CM | POA: Diagnosis not present

## 2022-02-10 DIAGNOSIS — R262 Difficulty in walking, not elsewhere classified: Secondary | ICD-10-CM | POA: Diagnosis not present

## 2022-02-10 DIAGNOSIS — Z96642 Presence of left artificial hip joint: Secondary | ICD-10-CM | POA: Diagnosis not present

## 2022-02-10 DIAGNOSIS — R531 Weakness: Secondary | ICD-10-CM | POA: Diagnosis not present

## 2022-02-10 DIAGNOSIS — M25652 Stiffness of left hip, not elsewhere classified: Secondary | ICD-10-CM | POA: Diagnosis not present

## 2022-02-17 DIAGNOSIS — Z471 Aftercare following joint replacement surgery: Secondary | ICD-10-CM | POA: Diagnosis not present

## 2022-02-17 DIAGNOSIS — Z96642 Presence of left artificial hip joint: Secondary | ICD-10-CM | POA: Diagnosis not present

## 2022-02-18 DIAGNOSIS — R262 Difficulty in walking, not elsewhere classified: Secondary | ICD-10-CM | POA: Diagnosis not present

## 2022-02-18 DIAGNOSIS — R531 Weakness: Secondary | ICD-10-CM | POA: Diagnosis not present

## 2022-02-18 DIAGNOSIS — Z96642 Presence of left artificial hip joint: Secondary | ICD-10-CM | POA: Diagnosis not present

## 2022-02-18 DIAGNOSIS — M25652 Stiffness of left hip, not elsewhere classified: Secondary | ICD-10-CM | POA: Diagnosis not present

## 2022-02-19 DIAGNOSIS — M9906 Segmental and somatic dysfunction of lower extremity: Secondary | ICD-10-CM | POA: Diagnosis not present

## 2022-02-19 DIAGNOSIS — M79605 Pain in left leg: Secondary | ICD-10-CM | POA: Diagnosis not present

## 2022-02-19 DIAGNOSIS — M25552 Pain in left hip: Secondary | ICD-10-CM | POA: Diagnosis not present

## 2022-02-19 DIAGNOSIS — M9905 Segmental and somatic dysfunction of pelvic region: Secondary | ICD-10-CM | POA: Diagnosis not present

## 2022-02-23 ENCOUNTER — Other Ambulatory Visit: Payer: Self-pay | Admitting: Neurology

## 2022-02-23 ENCOUNTER — Encounter: Payer: Self-pay | Admitting: Neurology

## 2022-02-23 DIAGNOSIS — G4700 Insomnia, unspecified: Secondary | ICD-10-CM | POA: Diagnosis not present

## 2022-02-23 MED ORDER — NONFORMULARY OR COMPOUNDED ITEM: 5 refills | Status: DC

## 2022-02-23 NOTE — Addendum Note (Signed)
Addended by: Gildardo Griffes on: 02/23/2022 01:50 PM   Modules accepted: Orders

## 2022-02-23 NOTE — Telephone Encounter (Signed)
[employer] order form completed, signed, and faxed to [employer]. Received a receipt of confirmation. Cream added to [facility].   Compound Cream 7A (allow alternate if needed for insurance): Amantadine 8%, Baclofen 2%, Gabapentin 8%, Amitriptyline 4%, Bupivacaine 2%, Clonidine 0.2%, Nifedipine 2%.   Instructions: Apply 1-2 grams to the affected area 3-4 times daily.

## 2022-02-23 NOTE — Telephone Encounter (Signed)
[employer] patient info sheet mailed to pt. Order form for compounded neuropathy cream ready for MD review and signature.

## 2022-02-24 DIAGNOSIS — M25652 Stiffness of left hip, not elsewhere classified: Secondary | ICD-10-CM | POA: Diagnosis not present

## 2022-02-24 DIAGNOSIS — R531 Weakness: Secondary | ICD-10-CM | POA: Diagnosis not present

## 2022-02-24 DIAGNOSIS — Z96642 Presence of left artificial hip joint: Secondary | ICD-10-CM | POA: Diagnosis not present

## 2022-02-24 DIAGNOSIS — R262 Difficulty in walking, not elsewhere classified: Secondary | ICD-10-CM | POA: Diagnosis not present

## 2022-02-26 DIAGNOSIS — Z96642 Presence of left artificial hip joint: Secondary | ICD-10-CM | POA: Diagnosis not present

## 2022-02-26 DIAGNOSIS — R531 Weakness: Secondary | ICD-10-CM | POA: Diagnosis not present

## 2022-02-26 DIAGNOSIS — R262 Difficulty in walking, not elsewhere classified: Secondary | ICD-10-CM | POA: Diagnosis not present

## 2022-02-26 DIAGNOSIS — M25652 Stiffness of left hip, not elsewhere classified: Secondary | ICD-10-CM | POA: Diagnosis not present

## 2022-02-27 ENCOUNTER — Other Ambulatory Visit (INDEPENDENT_AMBULATORY_CARE_PROVIDER_SITE_OTHER): Payer: Self-pay | Admitting: Family Medicine

## 2022-02-27 DIAGNOSIS — F3289 Other specified depressive episodes: Secondary | ICD-10-CM

## 2022-03-02 ENCOUNTER — Telehealth (INDEPENDENT_AMBULATORY_CARE_PROVIDER_SITE_OTHER): Payer: Medicare Other | Admitting: Family Medicine

## 2022-03-03 DIAGNOSIS — R262 Difficulty in walking, not elsewhere classified: Secondary | ICD-10-CM | POA: Diagnosis not present

## 2022-03-03 DIAGNOSIS — R531 Weakness: Secondary | ICD-10-CM | POA: Diagnosis not present

## 2022-03-03 DIAGNOSIS — M25652 Stiffness of left hip, not elsewhere classified: Secondary | ICD-10-CM | POA: Diagnosis not present

## 2022-03-03 DIAGNOSIS — Z96642 Presence of left artificial hip joint: Secondary | ICD-10-CM | POA: Diagnosis not present

## 2022-03-05 DIAGNOSIS — M9907 Segmental and somatic dysfunction of upper extremity: Secondary | ICD-10-CM | POA: Diagnosis not present

## 2022-03-05 DIAGNOSIS — M9903 Segmental and somatic dysfunction of lumbar region: Secondary | ICD-10-CM | POA: Diagnosis not present

## 2022-03-05 DIAGNOSIS — M25531 Pain in right wrist: Secondary | ICD-10-CM | POA: Diagnosis not present

## 2022-03-05 DIAGNOSIS — Z96642 Presence of left artificial hip joint: Secondary | ICD-10-CM | POA: Diagnosis not present

## 2022-03-05 DIAGNOSIS — R262 Difficulty in walking, not elsewhere classified: Secondary | ICD-10-CM | POA: Diagnosis not present

## 2022-03-05 DIAGNOSIS — M25652 Stiffness of left hip, not elsewhere classified: Secondary | ICD-10-CM | POA: Diagnosis not present

## 2022-03-05 DIAGNOSIS — M9905 Segmental and somatic dysfunction of pelvic region: Secondary | ICD-10-CM | POA: Diagnosis not present

## 2022-03-05 DIAGNOSIS — M9904 Segmental and somatic dysfunction of sacral region: Secondary | ICD-10-CM | POA: Diagnosis not present

## 2022-03-05 DIAGNOSIS — M545 Low back pain, unspecified: Secondary | ICD-10-CM | POA: Diagnosis not present

## 2022-03-05 DIAGNOSIS — R531 Weakness: Secondary | ICD-10-CM | POA: Diagnosis not present

## 2022-03-17 NOTE — Progress Notes (Signed)
TeleHealth Visit:  This visit was completed with telemedicine (audio/video) technology. [name] [name] has verbally consented to this TeleHealth visit. The patient is located at home, the provider is located at home. The participants in this visit include the listed provider and patient. The visit was conducted today via MyChart video.  OBESITY [name] [name] is here to discuss her progress with her obesity treatment plan along with follow-up of her obesity related diagnoses.   Today's visit was # 62 Starting weight: 258 lbs Starting date: 05/07/2018 Reported weight last virtual visit: 176 lbs Today's reported weight: 169.7 lbs  Total weight loss: 89 lbs  Weight change since last visit: 7 lbs Home BP today 129/72.  Pulse 83  Nutrition Plan: the Category 2 Plan - 20% adherence Hunger is poorly controlled. Cravings are moderately controlled.  Current exercise: gym (30 cardio and 30-45 minutes weights) 5 days per week.  Interim History:  She had left total hip replacement on May 19.  She is doing well and back to her exercise routine. [name] [name] has made it to her initial weight goal today-169 pounds (29 BMI).  Her new goal is 150-155 pounds.  She is very surprised that she has lost weight because she has been off plan-too many simple carbs and too little protein. She feels like she is hungry all of the time. However I feel that the Ozempic is helping her with portion size.  Assessment/Plan:  1. Type II Diabetes HgbA1c is at goal.  Last A1c was 5.2.  She generally has lab work at her PCP office. CBGs: Not checking Episodes of hypoglycemia? no Medication(s): Ozempic 2 mg weekly.  Tolerating well.  Lab Results  Component Value Date   HGBA1C 5.2 10/23/2021   HGBA1C 4.8 06/11/2021   HGBA1C 4.7 (L) 12/12/2020   Lab Results  Component Value Date   LDLCALC 90 06/11/2021   CREATININE 0.94 06/11/2021    Plan: Refill Ozempic 2 mg weekly.  2. Hypertension associated with T2DM Hypertension  well controlled.  Medication(s): losartan/HCTZ Denies chest pain and SOB.  BP Readings from Last 3 Encounters:  12/30/21 127/68  11/06/21 123/71  10/23/21 130/73   Lab Results  Component Value Date   CREATININE 0.94 06/11/2021   CREATININE 0.67 12/12/2020   CREATININE 0.73 06/27/2020    Plan: Refill losartan/HCTZ 100/25 mg daily.   3. Eating disorder/emotional eating [name] has had issues with stress/emotional eating. Currently this is moderately controlled. Overall mood is stable. Denies suicidal/homicidal ideation. Medication(s): Bupropion 200 mg twice daily.  She would like to reduce the dose back down to 150 mg daily  Plan: Refill bupropion 150 mg every morning with breakfast.  4. Obesity: Current BMI 29.9 [name] [name] is currently in the action stage of change. As such, her goal is to continue with weight loss efforts.  She has agreed to the Category 2 Plan.   Exercise goals: as is  Behavioral modification strategies: increasing lean protein intake and decreasing simple carbohydrates.  [name] [name] has agreed to follow-up with our clinic in 3 weeks.   No orders of the defined types were placed in this encounter.   Medications Discontinued During This Encounter  Medication Reason   buPROPion (WELLBUTRIN SR) 200 MG 12 hr tablet Dose change   Semaglutide, 2 MG/DOSE, 8 MG/3ML SOPN Reorder   losartan-hydrochlorothiazide (HYZAAR) 100-25 MG tablet Reorder     Meds ordered this encounter  Medications   losartan-hydrochlorothiazide (HYZAAR) 100-25 MG tablet    Sig: Take 1 tablet by mouth daily.    Dispense:  90 tablet    Refill:  0    Order Specific Question:   Supervising Provider    Answer:   Georgia Lopes [[id number]]   Semaglutide, 2 MG/DOSE, 8 MG/3ML SOPN    Sig: Inject 2 mg as directed once a week.    Dispense:  9 mL    Refill:  0    Order Specific Question:   Supervising Provider    Answer:   Georgia Lopes [[id number]]   buPROPion (WELLBUTRIN SR) 150 MG 12 hr tablet     Sig: Take 1 tablet (150 mg total) by mouth daily with breakfast.    Dispense:  90 tablet    Refill:  0    Order Specific Question:   Supervising Provider    Answer:   Georgia Lopes [[id number]]      Objective:   VITALS: Per patient if applicable, see vitals. GENERAL: Alert and in no acute distress. CARDIOPULMONARY: No increased WOB. Speaking in clear sentences.  PSYCH: Pleasant and cooperative. Speech normal rate and rhythm. Affect is appropriate. Insight and judgement are appropriate. Attention is focused, linear, and appropriate.  NEURO: Oriented as arrived to appointment on time with no prompting.   Lab Results  Component Value Date   CREATININE 0.94 06/11/2021   BUN 16 06/11/2021   NA 131 (L) 06/11/2021   K 5.1 06/11/2021   CL 92 (L) 06/11/2021   CO2 24 06/11/2021   Lab Results  Component Value Date   ALT 16 06/11/2021   AST 20 06/11/2021   ALKPHOS 65 06/11/2021   BILITOT 0.3 06/11/2021   Lab Results  Component Value Date   HGBA1C 5.2 10/23/2021   HGBA1C 4.8 06/11/2021   HGBA1C 4.7 (L) 12/12/2020   HGBA1C 4.8 04/04/2020   HGBA1C 4.6 (L) 10/12/2019   Lab Results  Component Value Date   INSULIN 1.5 (L) 12/12/2020   INSULIN 4.0 04/04/2020   INSULIN 3.5 10/12/2019   INSULIN 6.9 08/02/2018   INSULIN 7.0 04/27/2018   Lab Results  Component Value Date   TSH 1.930 06/11/2021   Lab Results  Component Value Date   CHOL 197 06/11/2021   HDL 93 06/11/2021   LDLCALC 90 06/11/2021   TRIG 77 06/11/2021   CHOLHDL 2.7 04/09/2016   Lab Results  Component Value Date   WBC 5.4 06/11/2021   HGB 11.6 06/11/2021   HCT 35.6 06/11/2021   MCV 89 06/11/2021   PLT 261 06/11/2021   Lab Results  Component Value Date   IRON 68 06/11/2021   TIBC 299 06/11/2021   FERRITIN 53 06/11/2021   Lab Results  Component Value Date   VD25OH 51.4 06/11/2021   VD25OH 45.3 12/12/2020   VD25OH 44.6 06/27/2020    Attestation Statements:   Reviewed by clinician on day of visit:  allergies, medications, problem list, medical history, surgical history, family history, social history, and previous encounter notes.

## 2022-03-18 ENCOUNTER — Encounter (INDEPENDENT_AMBULATORY_CARE_PROVIDER_SITE_OTHER): Payer: Self-pay | Admitting: Family Medicine

## 2022-03-18 ENCOUNTER — Telehealth (INDEPENDENT_AMBULATORY_CARE_PROVIDER_SITE_OTHER): Payer: Medicare Other | Admitting: Family Medicine

## 2022-03-18 ENCOUNTER — Other Ambulatory Visit (INDEPENDENT_AMBULATORY_CARE_PROVIDER_SITE_OTHER): Payer: Self-pay | Admitting: Family Medicine

## 2022-03-18 VITALS — Ht 63.0 in | Wt 169.0 lb

## 2022-03-18 DIAGNOSIS — Z6829 Body mass index (BMI) 29.0-29.9, adult: Secondary | ICD-10-CM

## 2022-03-18 DIAGNOSIS — F3289 Other specified depressive episodes: Secondary | ICD-10-CM

## 2022-03-18 DIAGNOSIS — I152 Hypertension secondary to endocrine disorders: Secondary | ICD-10-CM

## 2022-03-18 DIAGNOSIS — Z6841 Body Mass Index (BMI) 40.0 and over, adult: Secondary | ICD-10-CM

## 2022-03-18 DIAGNOSIS — F5089 Other specified eating disorder: Secondary | ICD-10-CM

## 2022-03-18 DIAGNOSIS — E1169 Type 2 diabetes mellitus with other specified complication: Secondary | ICD-10-CM

## 2022-03-18 DIAGNOSIS — E1159 Type 2 diabetes mellitus with other circulatory complications: Secondary | ICD-10-CM | POA: Diagnosis not present

## 2022-03-18 DIAGNOSIS — E669 Obesity, unspecified: Secondary | ICD-10-CM

## 2022-03-18 DIAGNOSIS — Z7985 Long-term (current) use of injectable non-insulin antidiabetic drugs: Secondary | ICD-10-CM | POA: Diagnosis not present

## 2022-03-18 MED ORDER — BUPROPION HCL ER (SR) 150 MG PO TB12: 150.0000 mg | ORAL_TABLET | Freq: Every day | ORAL | 0 refills | Status: DC

## 2022-03-18 MED ORDER — LOSARTAN POTASSIUM-HCTZ 100-25 MG PO TABS: 1.0000 | ORAL_TABLET | Freq: Every day | ORAL | 0 refills | Status: DC

## 2022-03-18 MED ORDER — SEMAGLUTIDE (2 MG/DOSE) 8 MG/3ML ~~LOC~~ SOPN: 2.0000 mg | PEN_INJECTOR | SUBCUTANEOUS | 0 refills | Status: DC

## 2022-03-18 NOTE — Addendum Note (Signed)
Addended by: Georgianne Fick on: 03/18/2022 05:53 PM   Modules accepted: Orders

## 2022-03-19 DIAGNOSIS — Z129 Encounter for screening for malignant neoplasm, site unspecified: Secondary | ICD-10-CM | POA: Diagnosis not present

## 2022-03-19 DIAGNOSIS — D485 Neoplasm of uncertain behavior of skin: Secondary | ICD-10-CM | POA: Diagnosis not present

## 2022-03-19 DIAGNOSIS — L820 Inflamed seborrheic keratosis: Secondary | ICD-10-CM | POA: Diagnosis not present

## 2022-03-26 ENCOUNTER — Telehealth: Payer: Self-pay | Admitting: Neurology

## 2022-03-26 NOTE — Telephone Encounter (Signed)
Rescheduled 7/17 appointment with pt over the phone - MD out

## 2022-03-27 ENCOUNTER — Other Ambulatory Visit (INDEPENDENT_AMBULATORY_CARE_PROVIDER_SITE_OTHER): Payer: Self-pay | Admitting: Family Medicine

## 2022-03-27 DIAGNOSIS — E1159 Type 2 diabetes mellitus with other circulatory complications: Secondary | ICD-10-CM

## 2022-03-30 DIAGNOSIS — M654 Radial styloid tenosynovitis [de Quervain]: Secondary | ICD-10-CM | POA: Diagnosis not present

## 2022-03-30 DIAGNOSIS — M25531 Pain in right wrist: Secondary | ICD-10-CM | POA: Diagnosis not present

## 2022-04-05 ENCOUNTER — Other Ambulatory Visit: Payer: Self-pay | Admitting: Neurology

## 2022-04-06 ENCOUNTER — Ambulatory Visit: Payer: Medicare Other | Admitting: Neurology

## 2022-04-07 ENCOUNTER — Ambulatory Visit (INDEPENDENT_AMBULATORY_CARE_PROVIDER_SITE_OTHER): Payer: Medicare Other | Admitting: Neurology

## 2022-04-07 DIAGNOSIS — G6289 Other specified polyneuropathies: Secondary | ICD-10-CM | POA: Diagnosis not present

## 2022-04-07 DIAGNOSIS — M62471 Contracture of muscle, right ankle and foot: Secondary | ICD-10-CM

## 2022-04-07 MED ORDER — LIDOCAINE-PRILOCAINE 2.5-2.5 % EX CREA: 1.0000 | TOPICAL_CREAM | CUTANEOUS | 11 refills | Status: DC | PRN

## 2022-04-07 NOTE — Progress Notes (Signed)
Botox- 200 units x 1 vial Lot: [id number] Expiration: 10/2024 NDC: 3403-5248-18  Bacteriostatic 0.9% Sodium Chloride- 22m total Lot: [id number]: 04/22/2023 NDC: [address]-95 Dx: GQ72.25B/B

## 2022-04-07 NOTE — Progress Notes (Signed)
TeleHealth Visit:  This visit was completed with telemedicine (audio/video) technology. [name] [name] has verbally consented to this TeleHealth visit. The patient is located at home, the provider is located at home. The participants in this visit include the listed provider and patient. The visit was conducted today via MyChart video.  OBESITY [name] [name] is here to discuss her progress with her obesity treatment plan along with follow-up of her obesity related diagnoses.   Today's visit was # 21 Starting weight: 258 lbs Starting date: 05/07/2018 Reported weight last virtual visit: 169.7 lbs Today's reported weight: 168.9 lbs  Total weight loss: 90 lbs  Weight change since last visit: -0.8 lbs Home BP today 111/69  Pulse 81  Nutrition Plan: the Category 2 Plan.  Hunger is moderately controlled. Cravings are moderately controlled.  Current exercise: gym (30 cardio/bike/elliptical and 30-45 minutes weights) 5 days per week.  Interim History: [name] [name] has done well and is down about a pound since her last office visit.  Her original weight goal was 150-155 but she thinks she may revise this to a higher number. She reports that she is hungry most of the time and is unsure that Ozempic 2 mg helps much.  However I feel the Ozempic has played a part in helping her lose 16 pounds since January. She is following category 2 only at breakfast.  She was not aware she could have starchy vegetables such as beans, peas, corn on the category 2 plan.  Assessment/Plan:  1. Type II Diabetes HgbA1c is at goal. Last A1c was 5.2. CBGs: Not checking Episodes of hypoglycemia: no Medication(s): Ozempic 2 mg weekly.  Lab Results  Component Value Date   HGBA1C 5.2 10/23/2021   HGBA1C 4.8 06/11/2021   HGBA1C 4.7 (L) 12/12/2020   Lab Results  Component Value Date   LDLCALC 90 06/11/2021   CREATININE 0.94 06/11/2021    Plan: Refill Ozempic 2 mg weekly.  2. Eating disorder/emotional eating [name] [name] has had  issues with stress/emotional eating. Currently this is moderately controlled. Overall mood is stable.  Medication(s): Bupropion 150 mg daily.  However she would like to increase this to 200 twice daily as she has been on in the past.  Plan: Increase dose of bupropion. Refill bupropion 200 mg twice daily.   3. Obesity: Current BMI 29.7 [name] [name] is currently in the action stage of change. As such, her goal is to continue with weight loss efforts.  She has agreed to practicing portion control and making smarter food choices, such as increasing vegetables and decreasing simple carbohydrates.   Copy of category 2 plan sent via MyChart.  Exercise goals: as is  Behavioral modification strategies: decreasing simple carbohydrates, decreasing liquid calories, and meal planning and cooking strategies.  [name] [name] has agreed to follow-up with our clinic in 4 weeks.   No orders of the defined types were placed in this encounter.   Medications Discontinued During This Encounter  Medication Reason   buPROPion (WELLBUTRIN SR) 150 MG 12 hr tablet Dose change     Meds ordered this encounter  Medications   buPROPion (WELLBUTRIN SR) 200 MG 12 hr tablet    Sig: Take 1 tablet (200 mg total) by mouth 2 (two) times daily.    Dispense:  180 tablet    Refill:  0    Order Specific Question:   Supervising Provider    Answer:   Dennard Nip D [[id number]]      Objective:   VITALS: Per patient if applicable, see vitals. GENERAL: Alert and  in no acute distress. CARDIOPULMONARY: No increased WOB. Speaking in clear sentences.  PSYCH: Pleasant and cooperative. Speech normal rate and rhythm. Affect is appropriate. Insight and judgement are appropriate. Attention is focused, linear, and appropriate.  NEURO: Oriented as arrived to appointment on time with no prompting.   Lab Results  Component Value Date   CREATININE 0.94 06/11/2021   BUN 16 06/11/2021   NA 131 (L) 06/11/2021   K 5.1 06/11/2021   CL 92 (L)  06/11/2021   CO2 24 06/11/2021   Lab Results  Component Value Date   ALT 16 06/11/2021   AST 20 06/11/2021   ALKPHOS 65 06/11/2021   BILITOT 0.3 06/11/2021   Lab Results  Component Value Date   HGBA1C 5.2 10/23/2021   HGBA1C 4.8 06/11/2021   HGBA1C 4.7 (L) 12/12/2020   HGBA1C 4.8 04/04/2020   HGBA1C 4.6 (L) 10/12/2019   Lab Results  Component Value Date   INSULIN 1.5 (L) 12/12/2020   INSULIN 4.0 04/04/2020   INSULIN 3.5 10/12/2019   INSULIN 6.9 08/02/2018   INSULIN 7.0 04/27/2018   Lab Results  Component Value Date   TSH 1.930 06/11/2021   Lab Results  Component Value Date   CHOL 197 06/11/2021   HDL 93 06/11/2021   LDLCALC 90 06/11/2021   TRIG 77 06/11/2021   CHOLHDL 2.7 04/09/2016   Lab Results  Component Value Date   WBC 5.4 06/11/2021   HGB 11.6 06/11/2021   HCT 35.6 06/11/2021   MCV 89 06/11/2021   PLT 261 06/11/2021   Lab Results  Component Value Date   IRON 68 06/11/2021   TIBC 299 06/11/2021   FERRITIN 53 06/11/2021   Lab Results  Component Value Date   VD25OH 51.4 06/11/2021   VD25OH 45.3 12/12/2020   VD25OH 44.6 06/27/2020    Attestation Statements:   Reviewed by clinician on day of visit: allergies, medications, problem list, medical history, surgical history, family history, social history, and previous encounter notes.

## 2022-04-07 NOTE — Progress Notes (Signed)
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 Hx of diabetic(per-diabetic) neuropathy.    Procedure: All procedures documented were medically necessary, reasonable and appropriate based on the patient's history, medical diagnosis and physician opinion. Verbal informed consent was obtained from the patient, patient was informed of potential risk of procedure, including bruising, bleeding, hematoma formation, infection, muscle weakness, muscle pain, numbness, transient hypertension, transient hyperglycemia and transient insomnia among others. All areas injected were topically clean with isopropyl rubbing alcohol. Nonsterile nonlatex gloves were worn during the procedure.    Injections were given unilateral to the right plantar surface of the foot medial surface ask her to point out the area of pain and the extensor digitorum brevis muscle and Tibials posterior. 100 units of Botox were used and none was wasted.   Dx: M62.471  (In the future if we continue: [phone] CPT code and no additional limb today but [phone] for additional limb)

## 2022-04-08 ENCOUNTER — Encounter (INDEPENDENT_AMBULATORY_CARE_PROVIDER_SITE_OTHER): Payer: Self-pay | Admitting: Family Medicine

## 2022-04-08 ENCOUNTER — Telehealth (INDEPENDENT_AMBULATORY_CARE_PROVIDER_SITE_OTHER): Payer: Medicare Other | Admitting: Family Medicine

## 2022-04-08 VITALS — Ht 63.0 in | Wt 168.0 lb

## 2022-04-08 DIAGNOSIS — F3289 Other specified depressive episodes: Secondary | ICD-10-CM | POA: Diagnosis not present

## 2022-04-08 DIAGNOSIS — Z6841 Body Mass Index (BMI) 40.0 and over, adult: Secondary | ICD-10-CM

## 2022-04-08 DIAGNOSIS — E1169 Type 2 diabetes mellitus with other specified complication: Secondary | ICD-10-CM | POA: Diagnosis not present

## 2022-04-08 DIAGNOSIS — E669 Obesity, unspecified: Secondary | ICD-10-CM

## 2022-04-08 DIAGNOSIS — Z6829 Body mass index (BMI) 29.0-29.9, adult: Secondary | ICD-10-CM | POA: Diagnosis not present

## 2022-04-08 DIAGNOSIS — Z7985 Long-term (current) use of injectable non-insulin antidiabetic drugs: Secondary | ICD-10-CM | POA: Diagnosis not present

## 2022-04-08 MED ORDER — BUPROPION HCL ER (SR) 200 MG PO TB12: 200.0000 mg | ORAL_TABLET | Freq: Two times a day (BID) | ORAL | 0 refills | Status: DC

## 2022-04-13 DIAGNOSIS — M9907 Segmental and somatic dysfunction of upper extremity: Secondary | ICD-10-CM | POA: Diagnosis not present

## 2022-04-13 DIAGNOSIS — M62471 Contracture of muscle, right ankle and foot: Secondary | ICD-10-CM | POA: Insufficient documentation

## 2022-04-13 DIAGNOSIS — M25531 Pain in right wrist: Secondary | ICD-10-CM | POA: Diagnosis not present

## 2022-04-13 DIAGNOSIS — G6289 Other specified polyneuropathies: Secondary | ICD-10-CM | POA: Insufficient documentation

## 2022-04-15 MED ORDER — ONABOTULINUMTOXINA 200 UNITS IJ SOLR: 200.0000 [IU] | Freq: Once | INTRAMUSCULAR | Status: DC

## 2022-04-15 NOTE — Addendum Note (Signed)
Addended by: Gildardo Griffes on: 04/15/2022 02:36 PM   Modules accepted: Orders

## 2022-04-21 NOTE — Progress Notes (Signed)
TeleHealth Visit:  This visit was completed with telemedicine (audio/video) technology. [name] [name] has verbally consented to this TeleHealth visit. The patient is located at home, the provider is located at home. The participants in this visit include the listed provider and patient. The visit was conducted today via MyChart video.  OBESITY [name] [name] is here to discuss her progress with her obesity treatment plan along with follow-up of her obesity related diagnoses.   Today's visit was # 68 Starting weight: 258 lbs Starting date: 05/07/2018 Reported weight last virtual visit: 168.9 lbs Today's reported weight: 170.3 lbs  Total weight loss: 88 lbs  Weight change since last visit: +1.4  lbs Home BP today 118/73  Pulse 76  Nutrition Plan: practicing portion control and making smarter food choices, such as increasing vegetables and decreasing simple carbohydrates.  Hunger is moderately controlled. Cravings are moderately controlled.  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  Her mother had a recent spine fracture and [name] [name] is having a great deal of stress caring for her.  She is up about a pound and a half and she feels this is due to stress. She has been unable to have regular meals at times and protein intake has been decreased. Exercise regimen is consistent however. He has a trip coming up to [place] in mid September and she hopes she will be able to go-it depends on her mother's health.  Discussed goal weight and decided that a realistic goal for her would be 165 to 170 pounds.  Assessment/Plan:  1. Other depression/emotional eating [name] [name] has had issues with stress/emotional eating.  She feels the recent increase in bupropion dose to 200 mg twice daily has helped with cravings.  She feels the bupropion is quite beneficial for mood  stabilization. Currently this is moderately controlled. Overall mood is stable. Denies suicidal/homicidal ideation. Medication(s): Bupropion 200 mg twice daily.  Plan: Continue bupropion 200 mg twice daily.  2. Obesity: Current BMI 30.12 [name] [name] is currently in the action stage of change. As such, her goal is to continue with weight loss efforts.  She has agreed to practicing portion control and making smarter food choices, such as increasing vegetables and decreasing simple carbohydrates.   Exercise goals:  as is  Behavioral modification strategies: increasing lean protein intake and decreasing simple carbohydrates.  [name] [name] has agreed to follow-up with our clinic in 2 weeks.   No orders of the defined types were placed in this encounter.   There are no discontinued medications.   No orders of the defined types were placed in this encounter.     Objective:   VITALS: Per patient if applicable, see vitals. GENERAL: Alert and in no acute distress. CARDIOPULMONARY: No increased WOB. Speaking in clear sentences.  PSYCH: Pleasant and cooperative. Speech normal rate and rhythm. Affect is appropriate. Insight and judgement are appropriate. Attention is focused, linear, and appropriate.  NEURO: Oriented as arrived to appointment on time with no prompting.   Lab Results  Component Value Date   CREATININE 0.94 06/11/2021   BUN 16 06/11/2021   NA 131 (L) 06/11/2021   K 5.1 06/11/2021   CL 92 (L) 06/11/2021   CO2 24 06/11/2021   Lab Results  Component Value Date   ALT 16 06/11/2021   AST 20 06/11/2021   ALKPHOS 65 06/11/2021   BILITOT 0.3 06/11/2021   Lab Results  Component Value Date  HGBA1C 5.2 10/23/2021   HGBA1C 4.8 06/11/2021   HGBA1C 4.7 (L) 12/12/2020   HGBA1C 4.8 04/04/2020   HGBA1C 4.6 (L) 10/12/2019   Lab Results  Component Value Date   INSULIN 1.5 (L) 12/12/2020   INSULIN 4.0 04/04/2020   INSULIN 3.5 10/12/2019   INSULIN 6.9 08/02/2018   INSULIN 7.0  04/27/2018   Lab Results  Component Value Date   TSH 1.930 06/11/2021   Lab Results  Component Value Date   CHOL 197 06/11/2021   HDL 93 06/11/2021   LDLCALC 90 06/11/2021   TRIG 77 06/11/2021   CHOLHDL 2.7 04/09/2016   Lab Results  Component Value Date   WBC 5.4 06/11/2021   HGB 11.6 06/11/2021   HCT 35.6 06/11/2021   MCV 89 06/11/2021   PLT 261 06/11/2021   Lab Results  Component Value Date   IRON 68 06/11/2021   TIBC 299 06/11/2021   FERRITIN 53 06/11/2021   Lab Results  Component Value Date   VD25OH 51.4 06/11/2021   VD25OH 45.3 12/12/2020   VD25OH 44.6 06/27/2020    Attestation Statements:   Reviewed by clinician on day of visit: allergies, medications, problem list, medical history, surgical history, family history, social history, and previous encounter notes.  Time spent on visit including the items listed below was 31 minutes.  -preparing to see the patient (e.g., review of tests, history, previous notes) -obtaining and/or reviewing separately obtained history -counseling and educating the patient/family/caregiver -documenting clinical information in the electronic or other health record

## 2022-04-22 ENCOUNTER — Telehealth (INDEPENDENT_AMBULATORY_CARE_PROVIDER_SITE_OTHER): Payer: Medicare Other | Admitting: Family Medicine

## 2022-04-22 ENCOUNTER — Encounter (INDEPENDENT_AMBULATORY_CARE_PROVIDER_SITE_OTHER): Payer: Self-pay | Admitting: Family Medicine

## 2022-04-22 VITALS — Ht 63.0 in | Wt 170.0 lb

## 2022-04-22 DIAGNOSIS — F3289 Other specified depressive episodes: Secondary | ICD-10-CM

## 2022-04-22 DIAGNOSIS — E669 Obesity, unspecified: Secondary | ICD-10-CM

## 2022-04-22 DIAGNOSIS — Z6830 Body mass index (BMI) 30.0-30.9, adult: Secondary | ICD-10-CM

## 2022-04-23 DIAGNOSIS — H353131 Nonexudative age-related macular degeneration, bilateral, early dry stage: Secondary | ICD-10-CM | POA: Diagnosis not present

## 2022-04-23 DIAGNOSIS — I781 Nevus, non-neoplastic: Secondary | ICD-10-CM | POA: Diagnosis not present

## 2022-04-23 DIAGNOSIS — I10 Essential (primary) hypertension: Secondary | ICD-10-CM | POA: Diagnosis not present

## 2022-04-23 DIAGNOSIS — H43813 Vitreous degeneration, bilateral: Secondary | ICD-10-CM | POA: Diagnosis not present

## 2022-04-23 DIAGNOSIS — H35373 Puckering of macula, bilateral: Secondary | ICD-10-CM | POA: Diagnosis not present

## 2022-04-23 DIAGNOSIS — E119 Type 2 diabetes mellitus without complications: Secondary | ICD-10-CM | POA: Diagnosis not present

## 2022-04-23 DIAGNOSIS — D485 Neoplasm of uncertain behavior of skin: Secondary | ICD-10-CM | POA: Diagnosis not present

## 2022-04-24 ENCOUNTER — Encounter: Payer: Self-pay | Admitting: Neurology

## 2022-04-24 ENCOUNTER — Ambulatory Visit (INDEPENDENT_AMBULATORY_CARE_PROVIDER_SITE_OTHER): Payer: Medicare Other | Admitting: Neurology

## 2022-04-24 VITALS — BP 145/85 | HR 77 | Ht 63.0 in | Wt 178.0 lb

## 2022-04-24 DIAGNOSIS — R5383 Other fatigue: Secondary | ICD-10-CM

## 2022-04-24 DIAGNOSIS — I679 Cerebrovascular disease, unspecified: Secondary | ICD-10-CM | POA: Diagnosis not present

## 2022-04-24 DIAGNOSIS — R251 Tremor, unspecified: Secondary | ICD-10-CM | POA: Diagnosis not present

## 2022-04-24 DIAGNOSIS — R4189 Other symptoms and signs involving cognitive functions and awareness: Secondary | ICD-10-CM | POA: Diagnosis not present

## 2022-04-24 DIAGNOSIS — R9082 White matter disease, unspecified: Secondary | ICD-10-CM

## 2022-04-24 DIAGNOSIS — I639 Cerebral infarction, unspecified: Secondary | ICD-10-CM

## 2022-04-24 DIAGNOSIS — R2689 Other abnormalities of gait and mobility: Secondary | ICD-10-CM | POA: Diagnosis not present

## 2022-04-24 DIAGNOSIS — R4701 Aphasia: Secondary | ICD-10-CM | POA: Diagnosis not present

## 2022-04-24 MED ORDER — ALPRAZOLAM 2 MG PO TABS: 2.0000 mg | ORAL_TABLET | Freq: Three times a day (TID) | ORAL | 1 refills | Status: DC | PRN

## 2022-04-24 NOTE — Patient Instructions (Addendum)
Formal memory testing Dr. Sima Matas MRI of the brain w/wo contrast with MS protocol Pulse oximetry overnight for hypoxemia  Xanax temporarily  Alprazolam Tablets What is this medication? ALPRAZOLAM (al PRAY zoe lam) treats anxiety. It works by Child psychotherapist system calm down. It belongs to a group of medications called benzodiazepines. This medicine may be used for other purposes; ask your health care provider or pharmacist if you have questions. COMMON BRAND NAME(S): Xanax What should I tell my care team before I take this medication? They need to know if you have any of these conditions: Kidney disease Liver disease Lung disease, such as asthma or breathing problems Mental health condition Seizures Substance use disorder Suicidal thoughts, plans, or attempt by you or a family member An unusual or allergic reaction to alprazolam, other medications, foods, dyes, or preservatives Pregnant or trying to get pregnant Breastfeeding How should I use this medication? Take this medication by mouth. Take it as directed on the prescription label. Do not take it more often than directed. Keep taking it unless your care team tells you to stop. A special MedGuide will be given to you by the pharmacist with each prescription and refill. Be sure to read this information carefully each time. Talk to your care team about the use of this medication in children. Special care may be needed. People 65 years and older may have a stronger reaction and need a smaller dose. Overdosage: If you think you have taken too much of this medicine contact a poison control center or emergency room at once. NOTE: This medicine is only for you. Do not share this medicine with others. What if I miss a dose? If you miss a dose, take it as soon as you can. If it is almost time for your next dose, take only that dose. Do not take double or extra doses. What may interact with this medication? Do not take this  medication with any of the following: Adagrasib Certain antivirals for HIV or hepatitis Certain medications for fungal infections, such as ketoconazole, itraconazole, or posaconazole Clarithromycin Grapefruit juice Opioid medications for cough Sodium oxybate This medication may also interact with the following: Alcohol Antihistamines for allergy, cough and cold Certain medications for anxiety or sleep Certain medications for depression, such as amitriptyline, fluoxetine, fluvoxamine, nefazodone, sertraline Certain medications for seizures, such as carbamazepine, phenobarbital, phenytoin, primidone Cimetidine Digoxin Erythromycin Estrogen or progestin hormones General anesthetics, such as halothane, isoflurane, methoxyflurane, propofol Medications that relax muscles Opioid medications for pain Phenothiazines, such as chlorpromazine, mesoridazine, prochlorperazine, thioridazine This list may not describe all possible interactions. Give your health care provider a list of all the medicines, herbs, non-prescription drugs, or dietary supplements you use. Also tell them if you smoke, drink alcohol, or use illegal drugs. Some items may interact with your medicine. What should I watch for while using this medication? Visit your care team for regular checks on your progress. Tell your care team if your symptoms do not start to get better or if they get worse. Do not stop taking except on your care team's advice. You may develop a severe reaction. Your care team will tell you how much medication to take. Long term use of this medication may cause your brain and body to depend on it. This can happen even when used as directed by your care team. You and your care team will work together to determine how long you will need to take this medication. This medication may affect your coordination, reaction  time, or judgment. Do not drive or operate machinery until you know how this medication affects you.  Sit up or stand slowly to reduce the risk of dizzy or fainting spells. Drinking alcohol with this medication can increase the risk of these side effects. If you are taking another medication that also causes drowsiness, you may have more side effects. Give your care team a list of all medications you use. Your care team will tell you how much medication to take. Do not take more medication than directed. Get emergency help right away if you have problems breathing or unusual sleepiness. If you or your family notice any changes in your behavior, such as new or worsening depression, thoughts of harming yourself, anxiety, other unusual or disturbing thoughts, or memory loss, call your care team right away. Talk to your care team if you wish to become pregnant or think you might be pregnant. This medication can cause serious birth defects. Talk to your care team before breastfeeding. Changes to your treatment plan may be needed. What side effects may I notice from receiving this medication? Side effects that you should report to your care team as soon as possible: Allergic reactions--skin rash, itching, hives, swelling of the face, lips, tongue, or throat CNS depression--slow or shallow breathing, shortness of breath, feeling faint, dizziness, confusion, trouble staying awake Thoughts of suicide or self-harm, worsening mood, feelings of depression Side effects that usually do not require medical attention (report to your care team if they continue or are bothersome): Change in sex drive or performance Dizziness Drowsiness Nausea This list may not describe all possible side effects. Call your doctor for medical advice about side effects. You may report side effects to FDA at 1-800-FDA-1088. Where should I keep my medication? Keep out of the reach of children and pets. This medication can be abused. Keep it in a safe place to protect it from theft. Do not share it with anyone. It is only for you. Selling  or giving away this medication is dangerous and against the law. Store at room temperature between 20 and 25 degrees C (68 and 77 degrees F). Get rid of any unused medication after the expiration date. This medication may cause harm and death if it is taken by other adults, children, or pets. It is important to get rid of the medication as soon as you no longer need it, or it is expired. You can do this in two ways: Take the medication to a medication take-back program. Check with your pharmacy or law enforcement to find a location. If you cannot return the medication, check the label or package insert to see if the medication should be thrown out in the garbage or flushed down the toilet. If you are not sure, ask your care team. If it is safe to put it in the trash, take the medication out of the container. Mix the medication with cat litter, dirt, coffee grounds, or other unwanted substance. Seal the mixture in a bag or container. Put it in the trash. NOTE: This sheet is a summary. It may not cover all possible information. If you have questions about this medicine, talk to your doctor, pharmacist, or health care provider.  2023 [employer]/Gold Standard (2004-10-07 00:00:00)

## 2022-04-24 NOTE — Progress Notes (Signed)
[name]    Provider:  Dr Jaynee Eagles Referring Provider: [name], MD Primary Care Physician:  [name], PA-C   CC:  Stroke, Cavernous sinus mass s/p surgery, dizziness, peripheral neuropathy. Her risk factors are being overweight and prior smoking.   04/24/2022: She is not doing great, her mother is having cognitive problems, mom is on hydrocodone. Patient also feels she has cognitive problems, developed a tremor, has imbalance, botox is helping, she is having imbalance, having cognitive declines, short term memory loss, problems getting words out. Will wait for rodenbough for forma neuropsych testing, she has a lot of white matter changes of the brain, need to follow up for strokes or demyelinating disease. MRI of the brain w/wo contrast add MS protocol. She feels she can't get words out, say different words all the time, extremely frequent, forgets words, progressively getting worse. Wakes frequently, had mild sleep apnea on a test years ago, we will order pulsox overnight  Patient complains of symptoms per HPI as well as the following symptoms: cognitive decline . Pertinent negatives and positives per HPI. All others negative    10/06/2021: She is having aphasia, discussed repeat MRI brain but at this time appears mild and non toxc, other causes of aphasia or word finding difficulties include, we discussed speech therapy. We discussed some skin removal, she had abdominoplasty with [name]. She had facelift in [place] run Dr. [name]. We spoke about mounjaro, she wil [facility] and talk to them. She had qutenza but it was $1500, she couldn't do the [place]. On top of the right botox. I am in the process of checking to and see if qutenza has a copay assistance program. We spoke about other options like botox and I spoke to my team about that as well.    Interval history 04/07/2021: She had nephrectomy 2 weeks ago, also removed 2 lymph  nodes, she is doing great, she even went to the gym, pathology has not returned yet. Surgeon says everything was removed, she may have to have scans but she may not have to have chemo or radiation.   She has her [facility] restorer, she feels much better with her neuropathy, at least >70% improvement in pain, increased quality of life, increased ability to exercise/mobility, she is doing great, she is using it every day, no side effects, 100% compliance and doing excellent in all regards.   Patient complains of symptoms per HPI as well as the following symptoms: s/p surgery doing well . Pertinent negatives and positives per HPI. All others negative   Interval history 02/05/2021: She has peripheral neuropathy. Diabetes and B12 thought to contribute to her neuropathy ongoing for 20+ years. Hgba1c now better with weight loss4.7, B12 414 and has been in a good range the last year, we did genetic testing, we checked b1, b6, b12, tsh, ana/reflex, IFE/Spep, heavy metals, RF celiac Abs, hep c, rpr, sjogren's, sed rate, it is more numb, she has a burning electrical pain on the ventral medial surface, genetic panel invitae was negative 80 genes plus amyloid. 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 MRI of the lumbar spine in the past showed some mild stenosis and multilevel spondylosis,, she is having lower back pain, may repeat the MRI of the lumbar spine for worsening spinal stenosis.   Patient complains of symptoms per HPI as well as the following symptoms:intentional weight loss . Pertinent negatives and positives per HPI.  All others negative    Interval history 07/09/2019: Can come off of xarelto for surgery. 3 years without afib. If we are asked happy to write her a note. She had a massage Wednesday and Thursday she had an epicode of vertigo, she turned over in bed and had a second episode. She felt dizzy for a few days and then again had  imbalance and fell with dizziness. She had vestibular therapy and was better. Happy to give her valium as needed. She recently had epley maneuver teaching again and feels better.   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  In the past we discussed smoking which can cause cerebrovascular atherosclerosis.  We recommended the healthy weight and wellness center.  No signs or symptoms of sleep apnea, and had a negative sleep eval in the past.  She also had a cavernous sinus mass status post removal. She has had a loop recorder placed.  She is on Gabapentin for neuropathy, Xarelto for stroke prevention. Takes Vitamin B supplements. She had bariatric surgery, resolved OSA. Loop hasn;t shown any afib or aflutter. Continues the Xarelto. She goes yearly for MRi to evaluate for previous sinus mass. Gabapentin helps. She continues to take B12 daily. HgbA1c 6.2 up a bit. She follows for cholesterol. She is taking Calcium and vitamin D daily now since showing Osteopenia. Weight stable.   Interval history 01/18/2017:  Patient is here for follow up. 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 Feet are burning a little more. 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  Recommend the Healthy Weight and [place] to address her obesity and multiple joint pain. She does not snore and is not tired during the day, had a borderline sleep evaluation in the past will monitor for OSA, The burning in her feet is worsening.  More noticeable at night. She is on Lipoic acid daily.    Interval history 07/20/2016: She has had neuropathy for years.She has had neuropathy for over 15 years or longer. It started in the feet. Numbness, burning in the feet, they feel cold to the touvh but she feels them burning, she has pain in the  feet. She tried Lyrica possible in a neuropathy drug trial and she believe she had side effects. Worse at night in bed. Cramping in the feet.    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 She does not know how long she had B12 deficiency. Her last B12 was normal 843. She takes oral supplements. Also HgbA1c 6.0. TSH 2.070. She also has a history of diabetes which improved after bariatric surgery but still elevated 6.0. HgbA1c was as high as 7.7 in the past.    Interval history 06/09/2016: Since being seen patient has had another stroke. She was admitted 04/10/2016. She had newly identified clot by LE by doppler but TCD bubble study was negative. She was discharged on Xarelto. Loop recorder was placed. She presented to the ED with slurred speech. Most symptoms have resolved. She cannot walk very well, her legs and feet are very numb. She has numbness in the feet and legs. She has imbalance. And she has had a history of B12 deficiency. She still has the mouth numbness and right hand sensory changes from previous stroke but the aphasia is  resolved.    Ct Head Wo Contrast 04/08/2016  1. 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 2. New focal low-density in the left posterior frontal periventricular white matter, potentially a subacute small vessel infarct. Extensive chronic small vessel ischemic changes in the periventricular white matter are otherwise stable. No evidence of acute cortical stroke or hemorrhage.    Mr Brain Wo Contrast 04/08/2016  1. Confirmation of acute nonhemorrhagic white matter infarct involving the left corona radiata and posterior centrum semi of ally. 2. Otherwise stable severe periventricular and diffuse subcortical T2 hyperintensities, advanced for age. This represents the sequela of chronic microvascular ischemia. 3. Postoperative changes of the left ethmoid sinus with residual pituitary adenoma  including invasion into the left cavernous sinus.    Ct Angio Head & Neck W Or Wo Contrast 04/09/2016  1. Positive for arterial abnormalities associated with the acute white matter ischemia: Severe stenosis at the origin of a left MCA middle sylvian division M2 branch. Moderate irregularity and stenosis in anterior division left M2/ M3 branches. No left MCA M1 stenosis or occlusion. 2. Otherwise carotid bifurcation and ICA siphon atherosclerosis without hemodynamically significant stenosis. 3. Negative posterior circulation. 4. Stable CT appearance of the brain since yesterday. No associated hemorrhage or mass effect. 5. Abnormal CT appearance of the sella turcica and left cavernous sinus corresponding to the chronic invasive pituitary adenoma. 6. No acute findings in the neck.    LE venous dopplers Right lower extremity is negative for deep vein thrombosis. The left lower extremity is positive for deep vein thrombosis involving a small segment of the left mid femoral vein. There is no evidence of Baker's cyst bilaterally   TEE Normal TEE. No cardioembolic source.   TCD bubble study - negative for PFO   HPI:  [name] [name] is a 71 y.o. female here as a follow-up from the hospital after a stroke. She still has numbness in the right fingers and moth due to the left thalamic stroke. We discussed her strokes, she is due for Cardiac Monitoring and then possibly Loop. Will discuss with our Vascular doctors. Also discussed at length the invasive pituitary tumor shown, I reviewed imaging with patient and pointed out findingd, discussed next steps including MRI w/wo with pituitary protocol as well as referral to [place].She was not on an aspirin when this happened.    Reviewed notes, labs and imaging from outside physicians, which showed:   IMPRESSION: 1. Acute lacunar infarct in the left thalamus. 2. Possible subacute lacunar infarct in the right splenium of the corpus callosum. 3. Moderate chronic small  vessel ischemic disease. 4. Mass involving the sella, left cavernous sinus, and left sphenoid sinus, suspicious for invasive pituitary macroadenoma. Further evaluation with nonemergent pituitary protocol MRI (without and with contrast) is recommended. NEUROSURGERY REVIEWED AND FEELS THIS IS A MASS OF THE SPHENOID SINUS THAT HAS INVADED THE CAVERNOUS SINUS,NOT PITUITARY TUMOR 5. No major intracranial arterial occlusion or significant proximal posterior circulation stenosis. 6. Severe proximal left M2 MCA stenoses.   Review of Systems: Patient complains of symptoms per HPI as well as the following symptoms: neuropathy . Pertinent negatives and positives per HPI. All others negative    Social History   Socioeconomic History   Marital status: Single    Spouse name: Not on file   Number of children: 0   Years of education: college   Highest education level: Not on file  Occupational History   Occupation:  Retired  Tobacco Use   Smoking status: Former    Packs/day: 1.00    Years: 40.00    Total pack years: 40.00    Types: Cigarettes    Quit date: 08/2006    Years since quitting: 15.6   Smokeless tobacco: Never  Vaping Use   Vaping Use: Never used  Substance and Sexual Activity   Alcohol use: Yes    Alcohol/week: 2.0 standard drinks of alcohol    Types: 2 Glasses of wine per week    Comment: 07/06/2019 maybe 2 glasses of wine/week; 04/08/2016 "5-6 drinks/year'   Drug use: No    Types: Marijuana    Comment: 04/08/2016 "did some marijuana in college"   Sexual activity: Never    Birth control/protection: Surgical  Other Topics Concern   Not on file  Social History Narrative   Lives at home alone   Right-handed   Drinks 4 cups of coffee a day    Social Determinants of Health   Financial Resource Strain: Not on file  Food Insecurity: Not on file  Transportation Needs: Not on file  Physical Activity: Not on file  Stress: Not on file  Social Connections: Not on file  Intimate  Partner Violence: Not on file    Family History  Problem Relation Age of Onset   Neuropathy Mother    Hypertension Mother    Hyperlipidemia Mother    Thyroid disease Mother    Prostate cancer Father    Lymphoma Father    Hypertension Father    Hyperlipidemia Father    Cancer Father    Obesity Father    Brain cancer Father    Congestive Heart Failure Maternal Grandmother    Lung cancer Maternal Grandfather    Stomach cancer Neg Hx    Colon cancer Neg Hx    Esophageal cancer Neg Hx    Pancreatic cancer Neg Hx     Past Medical History:  Diagnosis Date   Anemia    Arthritis    "knees, hips, possibly back" (04/08/2016)   Atrial septal aneurysm    B12 deficiency    a. following gastric bypass.   Back pain    Cancer of kidney (HCC)    Cataract    Chronic lower back pain    spondylosis   Cold feet    Daily headache    "short ones for the last month or 2" (04/08/2016)   Depression    Diabetes (HCC)    Dry mouth    DVT (deep venous thrombosis) (HCC)    Easy bruising    Floaters in visual field    GERD (gastroesophageal reflux disease)    takes Protonix daily "just to protect my stomach; not for reflux" (04/08/2016)   Hay fever    Heart murmur    "dx'd by 1 anesthesiologist years and years ago"   History of loop recorder 03/2016   Hyperlipidemia    Hypertension    Joint pain    Leg cramp    Macular degeneration    "just watching"   Neuropathy    Nosebleed    Palpitations    Peripheral edema    Peripheral neuropathy    not on any meds   Pituitary mass ([place])    a. s/p endoscopic surgery 02/2016.   Pneumonia 12/2017   Sleep apnea    no CPAP since weight loss after bariatric surgery '12 (04/08/2016)   Spondylosis    Stroke ([place]) 04/07/2016   a. 01/2016 and 03/2016.  during admission had + LE DVT.   Swelling of extremity    TIA (transient ischemic attack)    Trouble in sleeping    Urinary incontinence    Weakness     Past Surgical History:  Procedure Laterality  Date   ABDOMINOPLASTY     BRACHIOPLASTY     and more tissue removed during a second surgery   CATARACT EXTRACTION W/ INTRAOCULAR LENS  IMPLANT, BILATERAL Bilateral ~ 2012   COLONOSCOPY     EP IMPLANTABLE DEVICE N/A 04/10/2016   Procedure: Loop Recorder Insertion;  Surgeon: Will Meredith Leeds, MD;  Location: [facility];  Service: Cardiovascular;  Laterality: N/A;   ESOPHAGOGASTRODUODENOSCOPY     in the 70's   FACIAL COSMETIC SURGERY     JOINT REPLACEMENT     MASTOPEXY     NEPHRECTOMY  03/2021   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   SINUS ENDO W/FUSION  02/28/2016   Procedure: ENDOSCOPIC SINUS SURGERY WITH NAVIGATION;  Surgeon: Ruby Cola, MD;  Location: [facility];  Service: ENT;;   TEE WITHOUT CARDIOVERSION N/A 04/10/2016   Procedure: TRANSESOPHAGEAL ECHOCARDIOGRAM (TEE) POSSIBLE LOOP;  Surgeon: Sanda Klein, MD;  Location: [facility];  Service: Cardiovascular;  Laterality: N/A;   [place] Right 2008   TOTAL HIP REVISION Right 03/30/2016   Procedure: RIGHT TOTAL HIP REVISION;  Surgeon: Frederik Pear, MD;  Location: [facility];  Service: Orthopedics;  Laterality: Right;   TOTAL KNEE ARTHROPLASTY Bilateral 2001   VAGINAL HYSTERECTOMY     "left my ovaries"    Current Outpatient Medications  Medication Sig Dispense Refill   alprazolam (XANAX) 2 MG tablet Take 1 tablet (2 mg total) by mouth 3 (three) times daily as needed for sleep. 90 tablet 1   buPROPion (WELLBUTRIN SR) 200 MG 12 hr tablet Take 1 tablet (200 mg total) by mouth 2 (two) times daily. 180 tablet 0   capsicum (ZOSTRIX) 0.075 % topical cream Apply 1 application topically 3 (three) times daily. 5 g 3   gabapentin (NEURONTIN) 300 MG capsule Take by mouth.     lidocaine-prilocaine (EMLA) cream Apply 1 Application topically as needed. 30 g 11   losartan-hydrochlorothiazide (HYZAAR) 100-25 MG tablet Take 1 tablet by mouth daily. 90 tablet 0   magnesium oxide (MAG-OX) 400 MG tablet Take  400 mg by mouth at bedtime.      Multiple Vitamins-Minerals (OCUVITE-LUTEIN PO) Take 1 tablet by mouth daily.     NONFORMULARY OR COMPOUNDED ITEM Compound Cream 7A (allow alternate if needed for insurance): Amantadine 8%, Baclofen 2%, Gabapentin 8%, Amitriptyline 4%, Bupivacaine 2%, Clonidine 0.2%, Nifedipine 2%.   Instructions: Apply 1-2 grams to the affected area 3-4 times daily. 1 each 5   rosuvastatin (CRESTOR) 20 MG tablet Take 20 mg by mouth at bedtime.     Semaglutide, 2 MG/DOSE, 8 MG/3ML SOPN Inject 2 mg as directed once a week. 9 mL 0   XARELTO 20 MG TABS tablet Take 1 tablet (20 mg total) by mouth daily with supper. 90 tablet 3   Current Facility-Administered Medications  Medication Dose Route Frequency Provider Last Rate Last Admin   botulinum toxin Type A (BOTOX) injection 200 Units  200 Units Intramuscular Once Melvenia Beam, MD        Allergies as of 04/24/2022 - Review Complete 04/24/2022  Allergen Reaction Noted   Ambien [zolpidem] Other (See Comments) 03/27/2016   Atorvastatin Other (See Comments) 02/24/2016   Codeine Nausea And  Vomiting 05/29/2011   Penicillins Itching and Other (See Comments) 05/29/2011   Simvastatin Other (See Comments) 03/27/2016   Meclizine  07/06/2019   Meclizine hcl  11/18/2020   Other Other (See Comments) 11/18/2020   Byetta 10 mcg pen [exenatide] Nausea Only 03/27/2016     Exam: NAD, pleasant                  Speech:    Speech is normal; fluent and spontaneous with normal comprehension.  Cognition:    The patient is oriented to person, place, and time;     recent and remote memory intact;     language fluent;    Cranial Nerves:    The pupils are equal, round, and reactive to light.Trigeminal sensation is intact and the muscles of mastication are normal. The face is symmetric. The palate elevates in the midline. Hearing intact. Voice is normal. Shoulder shrug is normal. The tongue has normal motion without fasciculations.    Coordination:  No dysmetria  Motor Observation:    No asymmetry, no atrophy, and no involuntary movements noted. Tone:    Normal muscle tone.     Strength:    Strength is V/V in the upper and lower limbs.      Sensation: intact to LT       Reflex Exam:  Sensation(stable): decreased pin prick and temp to the mid calves, absent vibration to the ankles, intact proprioception. Numbness right knee. Decreased hypesthesias and dyesthesias since using [facility] restorer but still with significant pain  Absent AJs. stable      Assessment and Plan: Ms. PIERRA SKORA is a 71 y.o. Complicated patient with history of HTN, HLD,TIA/stroke, diet controlled DM type 2, peripheral neuropathy,s/p gastric bypass. .  L corona radiata/posterior centrum semiovale infarct, etiology not clear. She had left thalamic and right splenium punctate infarcts in 01/2016, suspicious for cardioembolic infarcts. Pt does have severe L M2 inferior division stenosis and recent hypotension, but the distribution of M2 inferior does not fit into these infarcts. TEE normal and loop placed. Found to have DVT likely due to recent hip surgery but TCD bubble study no PFO.  Has white matter changes on MRI brain, Cavernous sinus mass s/p surgery, dizziness, peripheral neuropathy. Many vascular risk factors factors including obesity, overweight and prior smoking, prior stroke, HTN,HLD, DM2.  04/24/2022: Patient feels she has cognitive problems, developed a tremor, has imbalance, she is having imbalance, having cognitive declines, short term memory loss, problems getting words out. Will wait for rodenbough for forma neuropsych testing, she has a lot of white matter changes of the brain, need to follow up for strokes or demyelinating disease. MRI of the brain w/wo contrast add MS protocol. She feels she can't get words out, say different words all the time, extremely frequent, forgets words, progressively getting worse. Wakes frequently, had  mild sleep apnea on a test years ago, we will order pulsox overnight  She still has pain, qutenza 1500, tried multitude of medications, topicals (lidocaine, capsaicin, compounded) will see if qutenza has a copay program and will see about trying botox: Qutenza was too expensive, botox approved and she is getting >> 50% relief in pain  - Tried gabapentin, been to a chiropractor and paid out of pocket for treatments $6000, lyrica, tylenol, topical creams/lidocaine/compounded cream), elavil, etodolac, alpha lipoic acid, tramadol, tylnenol, alpha lipoic acid, elavil,asa, etodolac, mag, robaxin, prednisone,tramadol. Could try cymbalta next   - She has peripheral neuropathy. Diabetes and B12 thought to contribute to  her neuropathy ongoing for 20+ years. we did genetic testing, we checked b1, b6, b12, tsh, ana/reflex, IFE/Spep, heavy metals, RF celiac Abs, hep c, rpr, sjogren's, sed rate, it is more numb, she has a burning electrical pain on the ventral medial surface, genetic panel invitae was negative 80 genes plus amyloid. 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 MRI of the lumbar spine in the past showed some mild stenosis and multilevel spondylosis,, she is having lower back pain, may repeat the MRI of the lumbar spine for worsening spinal stenosis.l.   - numbness in the hands, been to the hand center, likely CTS.  - Can come off of xarelto for surgery. 3+ years without afib. If we are asked happy to write her a note.   - Vertigo. She had vestibular therapy and was better. Happy to give her valium as needed. She recently had epley maneuver teaching again and feels better.   - continue Xarelto for stroke prevention, for life   PRIOR Assessment and plan previous appointments :   - Emg/ncs of the left arm and left leg showed length-dependent axonal predominantly sensory neuropathy. Risk factors include b12 deficiency and diabetes.  Will test for other causes of neuropathy. neurontin at night.Alpha-lipoic acid daily. Discussed side effects as per patient instructions.   - Loop recorder placed 7/21 no afib or aflutter for close to 2 years - TCD bubble negative for PF -  LDL, HgbA1c controlled -  BP goal 130-150 due to left M2 severe stenosis - Continue statin   - HgbA1c 6.2, goal < 7.0 - Hx OSA, no CPAP since bariatric surgery in 2012, she denies any signs or symptoms no fatigue, not nodding off, nor morning headaches, no excessive fatigue, no known snoring. (she lost weight 336 was her max weight now 255) - Recommend the healthy weight and wellness center: sent referral  Orders Placed This Encounter  Procedures   MR BRAIN W [place] Metabolic Panel   Ambulatory referral to Neuropsychology    - continue Xarelto - Continue neurontin and botox for neuropathy  Cc; Dr. Tawny Asal, MD  [facility] [address] [place], [place] [address]  Phone [phone] Fax [phone]  I spent over 40 minutes of face-to-face and non-face-to-face time with patient on the  1. White matter abnormality on MRI of brain   2. Cognitive decline   3. Imbalance   4. Aphasia   5. Cerebrovascular disease   6. Tremor   7. Ischemic stroke ([place])   8. Other fatigue    diagnosis.  This included previsit chart review, lab review, study review, order entry, electronic health record documentation, patient education on the different diagnostic and therapeutic options, counseling and coordination of care, risks and benefits of management, compliance, or risk factor reduction

## 2022-04-25 LAB — BASIC METABOLIC PANEL
BUN/Creatinine Ratio: 16 (ref 12–28)
BUN: 17 mg/dL (ref 8–27)
CO2: 24 mmol/L (ref 20–29)
Calcium: 9 mg/dL (ref 8.7–10.3)
Chloride: 93 mmol/L — ABNORMAL LOW (ref 96–106)
Creatinine, Ser: 1.04 mg/dL — ABNORMAL HIGH (ref 0.57–1.00)
Glucose: 74 mg/dL (ref 70–99)
Potassium: 4.5 mmol/L (ref 3.5–5.2)
Sodium: 130 mmol/L — ABNORMAL LOW (ref 134–144)
eGFR: 57 mL/min/{1.73_m2} — ABNORMAL LOW (ref >59)

## 2022-04-27 ENCOUNTER — Encounter: Payer: Self-pay | Admitting: *Deleted

## 2022-04-27 ENCOUNTER — Telehealth: Payer: Self-pay | Admitting: Neurology

## 2022-04-27 ENCOUNTER — Other Ambulatory Visit: Payer: Self-pay | Admitting: *Deleted

## 2022-04-27 DIAGNOSIS — R0902 Hypoxemia: Secondary | ICD-10-CM

## 2022-04-27 NOTE — Progress Notes (Signed)
No problem, I signed it for you

## 2022-04-27 NOTE — Telephone Encounter (Signed)
Referral sent to [facility] [id number].

## 2022-04-27 NOTE — Progress Notes (Signed)
Order faxed to [facility]. Received a receipt of confirmation. Updated patient via mychart.

## 2022-04-28 ENCOUNTER — Telehealth: Payer: Self-pay | Admitting: Neurology

## 2022-04-28 NOTE — Telephone Encounter (Signed)
medicare/BCBS federal NPR sent to [facility]

## 2022-04-29 ENCOUNTER — Encounter: Payer: Self-pay | Admitting: Psychology

## 2022-04-29 ENCOUNTER — Encounter (INDEPENDENT_AMBULATORY_CARE_PROVIDER_SITE_OTHER): Payer: Self-pay

## 2022-04-30 DIAGNOSIS — C641 Malignant neoplasm of right kidney, except renal pelvis: Secondary | ICD-10-CM | POA: Diagnosis not present

## 2022-04-30 DIAGNOSIS — Z85528 Personal history of other malignant neoplasm of kidney: Secondary | ICD-10-CM | POA: Diagnosis not present

## 2022-05-05 DIAGNOSIS — Z8673 Personal history of transient ischemic attack (TIA), and cerebral infarction without residual deficits: Secondary | ICD-10-CM | POA: Diagnosis not present

## 2022-05-05 DIAGNOSIS — I10 Essential (primary) hypertension: Secondary | ICD-10-CM | POA: Diagnosis not present

## 2022-05-05 DIAGNOSIS — E785 Hyperlipidemia, unspecified: Secondary | ICD-10-CM | POA: Diagnosis not present

## 2022-05-05 DIAGNOSIS — I350 Nonrheumatic aortic (valve) stenosis: Secondary | ICD-10-CM | POA: Diagnosis not present

## 2022-05-05 NOTE — Progress Notes (Signed)
TeleHealth Visit:  This visit was completed with telemedicine (audio/video) technology. [name] [name] has verbally consented to this TeleHealth visit. The patient is located at home, the provider is located at home. The participants in this visit include the listed provider and patient. The visit was conducted today via MyChart video.  OBESITY [name] [name] is here to discuss her progress with her obesity treatment plan along with follow-up of her obesity related diagnoses.   Today's visit was # 69 Starting weight: 258 lbs Starting date: 05/07/2018 Reported weight last virtual visit: 170.3 lbs Today's reported weight: 173.5 lbs  Total weight loss: 85 lbs  Weight change since last visit: + 3.2 lbs Home BP today 106/63  Pulse 73 Weight goal: 165-170 lbs  Nutrition Plan: practicing portion control and making smarter food choices, such as increasing vegetables and decreasing simple carbohydrates.  Hunger is moderately controlled. Cravings are poorly controlled.  Current exercise: gym (30 cardio/bike/elliptical and 30-45 minutes weights) 5 days per week  Interim History: [name] [name] has had a stressful few weeks because her mother has been in a rehab facility post hospital discharge.  She is off of her normal eating pattern.   She has been consistent with exercise over the past week.  Assessment/Plan:  1. Type II Diabetes HgbA1c is at goal. Last A1c was 4.8 on 01/22/2022 with [facility]. CBGs: Not checking Episodes of hypoglycemia: no Medication(s): Ozempic 2 mg weekly. Appetite fairly well controlled.  Lab Results  Component Value Date   HGBA1C 5.2 10/23/2021   HGBA1C 4.8 06/11/2021   HGBA1C 4.7 (L) 12/12/2020   Lab Results  Component Value Date   LDLCALC 90 06/11/2021   CREATININE 1.04 (H) 04/24/2022    Plan: Continue Ozempic 2 mg weekly.   2.  Other depression/emotional eating [name] [name] has had issues with stress/emotional eating.  This has been exacerbated recently by her mother  stay in the hospital and then rehab. Currently this is poorly controlled. Overall mood is stable. Denies suicidal/homicidal ideation. Medication(s):) 200 mg twice daily  Plan: Continue bupropion 200 mg twice daily.  3. Obesity: Current BMI 30.6 [name] [name] is currently in the action stage of change. As such, her goal is to continue with weight loss efforts.  She has agreed to practicing portion control and making smarter food choices, such as increasing vegetables and decreasing simple carbohydrates.   Exercise goals: as is  Behavioral modification strategies: increasing lean protein intake and decreasing simple carbohydrates.  [name] has agreed to follow-up with our clinic in 2 weeks.   No orders of the defined types were placed in this encounter.   There are no discontinued medications.   No orders of the defined types were placed in this encounter.     Objective:   VITALS: Per patient if applicable, see vitals. GENERAL: Alert and in no acute distress. CARDIOPULMONARY: No increased WOB. Speaking in clear sentences.  PSYCH: Pleasant and cooperative. Speech normal rate and rhythm. Affect is appropriate. Insight and judgement are appropriate. Attention is focused, linear, and appropriate.  NEURO: Oriented as arrived to appointment on time with no prompting.   Lab Results  Component Value Date   CREATININE 1.04 (H) 04/24/2022   BUN 17 04/24/2022   NA 130 (L) 04/24/2022   K 4.5 04/24/2022   CL 93 (L) 04/24/2022   CO2 24 04/24/2022   Lab Results  Component Value Date   ALT 16 06/11/2021   AST 20 06/11/2021   ALKPHOS 65 06/11/2021   BILITOT 0.3 06/11/2021   Lab Results  Component Value Date   HGBA1C 5.2 10/23/2021   HGBA1C 4.8 06/11/2021   HGBA1C 4.7 (L) 12/12/2020   HGBA1C 4.8 04/04/2020   HGBA1C 4.6 (L) 10/12/2019   Lab Results  Component Value Date   INSULIN 1.5 (L) 12/12/2020   INSULIN 4.0 04/04/2020   INSULIN 3.5 10/12/2019   INSULIN 6.9 08/02/2018   INSULIN  7.0 04/27/2018   Lab Results  Component Value Date   TSH 1.930 06/11/2021   Lab Results  Component Value Date   CHOL 197 06/11/2021   HDL 93 06/11/2021   LDLCALC 90 06/11/2021   TRIG 77 06/11/2021   CHOLHDL 2.7 04/09/2016   Lab Results  Component Value Date   WBC 5.4 06/11/2021   HGB 11.6 06/11/2021   HCT 35.6 06/11/2021   MCV 89 06/11/2021   PLT 261 06/11/2021   Lab Results  Component Value Date   IRON 68 06/11/2021   TIBC 299 06/11/2021   FERRITIN 53 06/11/2021   Lab Results  Component Value Date   VD25OH 51.4 06/11/2021   VD25OH 45.3 12/12/2020   VD25OH 44.6 06/27/2020    Attestation Statements:   Reviewed by clinician on day of visit: allergies, medications, problem list, medical history, surgical history, family history, social history, and previous encounter notes.  Time spent on visit including the items listed below was 35 minutes.  -preparing to see the patient (e.g., review of tests, history, previous notes) -obtaining and/or reviewing separately obtained history -counseling and educating the patient/family/caregiver -documenting clinical information in the electronic or other health record

## 2022-05-06 ENCOUNTER — Encounter (INDEPENDENT_AMBULATORY_CARE_PROVIDER_SITE_OTHER): Payer: Self-pay | Admitting: Family Medicine

## 2022-05-06 ENCOUNTER — Telehealth (INDEPENDENT_AMBULATORY_CARE_PROVIDER_SITE_OTHER): Payer: Medicare Other | Admitting: Family Medicine

## 2022-05-06 VITALS — Ht 63.0 in | Wt 173.0 lb

## 2022-05-06 DIAGNOSIS — E1169 Type 2 diabetes mellitus with other specified complication: Secondary | ICD-10-CM

## 2022-05-06 DIAGNOSIS — Z7985 Long-term (current) use of injectable non-insulin antidiabetic drugs: Secondary | ICD-10-CM

## 2022-05-06 DIAGNOSIS — E669 Obesity, unspecified: Secondary | ICD-10-CM

## 2022-05-06 DIAGNOSIS — Z6830 Body mass index (BMI) 30.0-30.9, adult: Secondary | ICD-10-CM

## 2022-05-06 DIAGNOSIS — F3289 Other specified depressive episodes: Secondary | ICD-10-CM

## 2022-05-09 ENCOUNTER — Ambulatory Visit
Admission: RE | Admit: 2022-05-09 | Discharge: 2022-05-09 | Disposition: A | Discharge status: Home / Self Care | Payer: Medicare Other | Source: Ambulatory Visit | Attending: Neurology | Admitting: Neurology

## 2022-05-09 DIAGNOSIS — R9082 White matter disease, unspecified: Secondary | ICD-10-CM | POA: Diagnosis not present

## 2022-05-09 DIAGNOSIS — R2689 Other abnormalities of gait and mobility: Secondary | ICD-10-CM

## 2022-05-09 DIAGNOSIS — I639 Cerebral infarction, unspecified: Secondary | ICD-10-CM

## 2022-05-09 DIAGNOSIS — R4189 Other symptoms and signs involving cognitive functions and awareness: Secondary | ICD-10-CM

## 2022-05-09 DIAGNOSIS — I679 Cerebrovascular disease, unspecified: Secondary | ICD-10-CM

## 2022-05-09 DIAGNOSIS — R4701 Aphasia: Secondary | ICD-10-CM

## 2022-05-09 DIAGNOSIS — R251 Tremor, unspecified: Secondary | ICD-10-CM

## 2022-05-09 MED ORDER — GADOBENATE DIMEGLUMINE 529 MG/ML IV SOLN
15.0000 mL | Freq: Once | INTRAVENOUS | Status: AC | PRN
  Administered 2022-05-09: 15 mL via INTRAVENOUS

## 2022-05-12 DIAGNOSIS — I2723 Pulmonary hypertension due to lung diseases and hypoxia: Secondary | ICD-10-CM | POA: Diagnosis not present

## 2022-05-12 NOTE — Telephone Encounter (Signed)
OVERNIGHT PULSE OX REPORT received.  To Dr. Jaynee Eagles to review.

## 2022-05-19 NOTE — Progress Notes (Unsigned)
TeleHealth Visit:  This visit was completed with telemedicine (audio/video) technology. [name] [name] has verbally consented to this TeleHealth visit. The patient is located at home, the provider is located at home. The participants in this visit include the listed provider and patient. The visit was conducted today via MyChart video.  OBESITY [name] [name] is here to discuss her progress with her obesity treatment plan along with follow-up of her obesity related diagnoses.   Today's visit was # 43 Starting weight: 258 lbs Starting date: 05/07/2018 Reported weight last virtual visit: 173.5 lbs Today's reported weight: 172.1 lbs  Total weight loss: 86 lbs  Weight change since last visit: -1.4 lbs Home BP today 120/65  Pulse 78  Nutrition Plan: practicing portion control and making smarter food choices, such as increasing vegetables and decreasing simple carbohydrates.  Hunger is well controlled. Current exercise: gym (30 cardio/bike/elliptical and 30-45 minutes weights) 4-5 days per week  Interim History: [name] reports making poor food choices.  Based on food recall, protein intake is inadequate.  She has lost almost 1.5 pounds since last visit which I believe is largely due to taking Ozempic and bupropion. She has had a great deal of stress over the past few months because her mother was in rehab.  Mother is now home and doing well. She is taking a trip to [place] on September 18. Exercise routine is consistent and adequate.  Assessment/Plan:  1. Type II Diabetes HgbA1c is at goal. Last A1c was 5.2 Episodes of hypoglycemia: no Medication(s): Ozempic 2 mg weekly.  Has some constipation which she is managing with MiraLAX.  Lab Results  Component Value Date   HGBA1C 5.2 10/23/2021   HGBA1C 4.8 06/11/2021   HGBA1C 4.7 (L) 12/12/2020   Lab Results  Component Value Date   LDLCALC 90 06/11/2021   CREATININE 1.04 (H) 04/24/2022    Plan: Continue Ozempic 2 mg weekly.  2. Other  depression/emotional eating [name] has had issues with stress/emotional eating. Currently this is well controlled. Overall mood is stable.  Medication(s): Bupropion 200 mg twice daily.  Tolerating well.  Plan: Continue bupropion 200 mg twice daily.  3. Obesity: Current BMI 30.4 [name] is currently in the action stage of change. As such, her goal is to continue with weight loss efforts.  She has agreed to practicing portion control and making smarter food choices, such as increasing vegetables and decreasing simple carbohydrates.   Exercise goals: as is. Offered some suggestions for foods to increase protein intake.  Behavioral modification strategies: increasing lean protein intake and decreasing simple carbohydrates.  Addilyne has agreed to follow-up with our clinic in 2 weeks.   No orders of the defined types were placed in this encounter.   There are no discontinued medications.   No orders of the defined types were placed in this encounter.     Objective:   VITALS: Per patient if applicable, see vitals. GENERAL: Alert and in no acute distress. CARDIOPULMONARY: No increased WOB. Speaking in clear sentences.  PSYCH: Pleasant and cooperative. Speech normal rate and rhythm. Affect is appropriate. Insight and judgement are appropriate. Attention is focused, linear, and appropriate.  NEURO: Oriented as arrived to appointment on time with no prompting.   Lab Results  Component Value Date   CREATININE 1.04 (H) 04/24/2022   BUN 17 04/24/2022   NA 130 (L) 04/24/2022   K 4.5 04/24/2022   CL 93 (L) 04/24/2022   CO2 24 04/24/2022   Lab Results  Component Value Date   ALT 16  06/11/2021   AST 20 06/11/2021   ALKPHOS 65 06/11/2021   BILITOT 0.3 06/11/2021   Lab Results  Component Value Date   HGBA1C 5.2 10/23/2021   HGBA1C 4.8 06/11/2021   HGBA1C 4.7 (L) 12/12/2020   HGBA1C 4.8 04/04/2020   HGBA1C 4.6 (L) 10/12/2019   Lab Results  Component Value Date   INSULIN 1.5 (L)  12/12/2020   INSULIN 4.0 04/04/2020   INSULIN 3.5 10/12/2019   INSULIN 6.9 08/02/2018   INSULIN 7.0 04/27/2018   Lab Results  Component Value Date   TSH 1.930 06/11/2021   Lab Results  Component Value Date   CHOL 197 06/11/2021   HDL 93 06/11/2021   LDLCALC 90 06/11/2021   TRIG 77 06/11/2021   CHOLHDL 2.7 04/09/2016   Lab Results  Component Value Date   WBC 5.4 06/11/2021   HGB 11.6 06/11/2021   HCT 35.6 06/11/2021   MCV 89 06/11/2021   PLT 261 06/11/2021   Lab Results  Component Value Date   IRON 68 06/11/2021   TIBC 299 06/11/2021   FERRITIN 53 06/11/2021   Lab Results  Component Value Date   VD25OH 51.4 06/11/2021   VD25OH 45.3 12/12/2020   VD25OH 44.6 06/27/2020    Attestation Statements:   Reviewed by clinician on day of visit: allergies, medications, problem list, medical history, surgical history, family history, social history, and previous encounter notes.  Time spent on visit including the items listed below was 32 minutes.  -preparing to see the patient (e.g., review of tests, history, previous notes) -obtaining and/or reviewing separately obtained history -counseling and educating the patient/family/caregiver -documenting clinical information in the electronic or other health record

## 2022-05-20 ENCOUNTER — Telehealth (INDEPENDENT_AMBULATORY_CARE_PROVIDER_SITE_OTHER): Payer: Medicare Other | Admitting: Family Medicine

## 2022-05-20 ENCOUNTER — Encounter (INDEPENDENT_AMBULATORY_CARE_PROVIDER_SITE_OTHER): Payer: Self-pay | Admitting: Family Medicine

## 2022-05-20 VITALS — BP 120/65 | HR 78 | Ht 63.0 in | Wt 172.0 lb

## 2022-05-20 DIAGNOSIS — E1169 Type 2 diabetes mellitus with other specified complication: Secondary | ICD-10-CM

## 2022-05-20 DIAGNOSIS — Z6830 Body mass index (BMI) 30.0-30.9, adult: Secondary | ICD-10-CM

## 2022-05-20 DIAGNOSIS — E669 Obesity, unspecified: Secondary | ICD-10-CM

## 2022-05-20 DIAGNOSIS — F3289 Other specified depressive episodes: Secondary | ICD-10-CM

## 2022-05-21 DIAGNOSIS — Z124 Encounter for screening for malignant neoplasm of cervix: Secondary | ICD-10-CM | POA: Diagnosis not present

## 2022-05-21 DIAGNOSIS — N898 Other specified noninflammatory disorders of vagina: Secondary | ICD-10-CM | POA: Diagnosis not present

## 2022-05-28 ENCOUNTER — Other Ambulatory Visit: Payer: Self-pay | Admitting: Neurology

## 2022-05-28 ENCOUNTER — Ambulatory Visit (INDEPENDENT_AMBULATORY_CARE_PROVIDER_SITE_OTHER): Payer: Medicare Other | Admitting: Podiatry

## 2022-05-28 DIAGNOSIS — L84 Corns and callosities: Secondary | ICD-10-CM

## 2022-05-28 DIAGNOSIS — Q828 Other specified congenital malformations of skin: Secondary | ICD-10-CM

## 2022-05-28 DIAGNOSIS — Z7184 Encounter for health counseling related to travel: Secondary | ICD-10-CM | POA: Diagnosis not present

## 2022-05-28 NOTE — Progress Notes (Signed)
  Subjective:  Patient ID: [name] [name], female    DOB: [birth year],  MRN: [id number]  Chief Complaint  Patient presents with   Callouses    Bilateral callus     71 y.o. female presents with the above complaint. History confirmed with patient.  Patient presents today for painful callus present primarily at the plantar aspect of the right fifth metatarsal head.  She has had this callus for months to years.  It is slowly regrown.  She is going on a upcoming trip to [place] and wanted to get the callus pared down before she goes on the trip for comfort.  Objective:  Physical Exam: warm, good capillary refill, nail exam normal nails without lesions, no trophic changes or ulcerative lesions. DP pulses palpable, PT pulses palpable, and protective sensation intact Left Foot: tenderness of the 5th metatarsal head mild to moderate a little hyperkeratotic tissue underlying the fifth metatarsal head. Right Foot: tenderness of the 5th metatarsal head mild to moderate buildup of hyperkeratotic tissue underlying the fifth metatarsal head.  Mild callus formation at the medial aspect of the hallux IPJ.  No images are attached to the encounter.  Assessment:  No diagnosis found.   Plan:  Patient was evaluated and treated and all questions answered.  Hyperkeratotic lesion/callus of foot right and left.  3 separate lesions debrided All symptomatic hyperkeratoses were safely debrided with a sterile #15 blade to patient's level of comfort without incident. We discussed preventative and palliative care of these lesions including supportive and accommodative shoegear, padding, prefabricated and custom molded accommodative orthoses, use of a pumice stone and lotions/creams daily.   No follow-ups on file.

## 2022-06-02 NOTE — Progress Notes (Signed)
TeleHealth Visit:  This visit was completed with telemedicine (audio/video) technology. [name] [name] has verbally consented to this TeleHealth visit. The patient is located at home, the provider is located at home. The participants in this visit include the listed provider and patient. The visit was conducted today via MyChart video.  [name] is here to discuss her progress with her obesity treatment plan along with follow-up of her obesity related diagnoses.   Today's visit was # 70  Starting weight: 258 lbs Starting date: 05/07/2018 Weight reported at last virtual office visit: 172.1 lbs on 05/20/22 Today's reported weight: 172.4 lbs No weight reported. Total weight loss: 86 Weight change since last visit: 0 Today's blood pressure is 117/59, pulse is 72.  Her goal weight is 169 pounds-29 BMI.  Nutrition Plan: practicing portion control and making smarter food choices, such as increasing vegetables and decreasing simple carbohydrates.   Current exercise: gym (30 cardio/bike/elliptical and 30-45 minutes weights) 4-5 days per week   Interim History: She reports appetite being satisfied and cravings fairly well controlled with bupropion.  She says she has not really focused on adhering well to plan. She is consistent with exercise. She is going to [place] next week for 7 nights.  She doubts she will gain weight on the trip due to the extra walking and the fact that she does not like all of the foods available there. Assessment/Plan:  1. Type II Diabetes HgbA1c is at goal. Last A1c was 5.2 on 2/223. Medication(s): Ozempic 2 mg weekly.  Denies side effects. Appetite controlled.  Lab Results  Component Value Date   HGBA1C 5.2 10/23/2021   HGBA1C 4.8 06/11/2021   HGBA1C 4.7 (L) 12/12/2020   Lab Results  Component Value Date   LDLCALC 90 06/11/2021   CREATININE 1.04 (H) 04/24/2022    Plan: Continue Ozempic 2 mg weekly.  2. Other depression/emotional eating Mood  stable.  She feels the bupropion has helped tremendously with her overall mood.  Cravings are improved. On bupropion 200 mg twice daily.   Plan: Continue bupropion 200 mg twice daily.   3. Obesity: Current BMI 30.4 [name] [name] is currently in the action stage of change. As such, her goal is to continue with weight loss efforts.  She has agreed to practicing portion control and making smarter food choices, such as increasing vegetables and decreasing simple carbohydrates.   Exercise goals: as is  Behavioral modification strategies: increasing lean protein intake, decreasing simple carbohydrates, and decreasing alcohol intake.  [name] [name] has agreed to follow-up with our clinic in 4 weeks.   No orders of the defined types were placed in this encounter.   There are no discontinued medications.   No orders of the defined types were placed in this encounter.     Objective:   VITALS: Per patient if applicable, see vitals. GENERAL: Alert and in no acute distress. CARDIOPULMONARY: No increased WOB. Speaking in clear sentences.  PSYCH: Pleasant and cooperative. Speech normal rate and rhythm. Affect is appropriate. Insight and judgement are appropriate. Attention is focused, linear, and appropriate.  NEURO: Oriented as arrived to appointment on time with no prompting.   Lab Results  Component Value Date   CREATININE 1.04 (H) 04/24/2022   BUN 17 04/24/2022   NA 130 (L) 04/24/2022   K 4.5 04/24/2022   CL 93 (L) 04/24/2022   CO2 24 04/24/2022   Lab Results  Component Value Date   ALT 16 06/11/2021   AST 20 06/11/2021   ALKPHOS 65 06/11/2021  BILITOT 0.3 06/11/2021   Lab Results  Component Value Date   HGBA1C 5.2 10/23/2021   HGBA1C 4.8 06/11/2021   HGBA1C 4.7 (L) 12/12/2020   HGBA1C 4.8 04/04/2020   HGBA1C 4.6 (L) 10/12/2019   Lab Results  Component Value Date   INSULIN 1.5 (L) 12/12/2020   INSULIN 4.0 04/04/2020   INSULIN 3.5 10/12/2019   INSULIN 6.9 08/02/2018   INSULIN 7.0  04/27/2018   Lab Results  Component Value Date   TSH 1.930 06/11/2021   Lab Results  Component Value Date   CHOL 197 06/11/2021   HDL 93 06/11/2021   LDLCALC 90 06/11/2021   TRIG 77 06/11/2021   CHOLHDL 2.7 04/09/2016   Lab Results  Component Value Date   WBC 5.4 06/11/2021   HGB 11.6 06/11/2021   HCT 35.6 06/11/2021   MCV 89 06/11/2021   PLT 261 06/11/2021   Lab Results  Component Value Date   IRON 68 06/11/2021   TIBC 299 06/11/2021   FERRITIN 53 06/11/2021   Lab Results  Component Value Date   VD25OH 51.4 06/11/2021   VD25OH 45.3 12/12/2020   VD25OH 44.6 06/27/2020    Attestation Statements:   Reviewed by clinician on day of visit: allergies, medications, problem list, medical history, surgical history, family history, social history, and previous encounter notes.  Time spent on visit including the items listed below was 32 minutes.  -preparing to see the patient (e.g., review of tests, history, previous notes) -obtaining and/or reviewing separately obtained history -counseling and educating the patient/family/caregiver -documenting clinical information in the electronic or other health record

## 2022-06-03 ENCOUNTER — Telehealth (INDEPENDENT_AMBULATORY_CARE_PROVIDER_SITE_OTHER): Payer: Medicare Other | Admitting: Family Medicine

## 2022-06-03 ENCOUNTER — Encounter (INDEPENDENT_AMBULATORY_CARE_PROVIDER_SITE_OTHER): Payer: Self-pay | Admitting: Family Medicine

## 2022-06-03 VITALS — BP 115/59 | HR 72 | Ht 63.0 in | Wt 172.0 lb

## 2022-06-03 DIAGNOSIS — Z6830 Body mass index (BMI) 30.0-30.9, adult: Secondary | ICD-10-CM

## 2022-06-03 DIAGNOSIS — Z7985 Long-term (current) use of injectable non-insulin antidiabetic drugs: Secondary | ICD-10-CM

## 2022-06-03 DIAGNOSIS — E1169 Type 2 diabetes mellitus with other specified complication: Secondary | ICD-10-CM

## 2022-06-03 DIAGNOSIS — F3289 Other specified depressive episodes: Secondary | ICD-10-CM | POA: Diagnosis not present

## 2022-06-03 DIAGNOSIS — E669 Obesity, unspecified: Secondary | ICD-10-CM

## 2022-06-23 DIAGNOSIS — E785 Hyperlipidemia, unspecified: Secondary | ICD-10-CM | POA: Diagnosis not present

## 2022-06-23 DIAGNOSIS — I350 Nonrheumatic aortic (valve) stenosis: Secondary | ICD-10-CM | POA: Diagnosis not present

## 2022-06-23 DIAGNOSIS — M25552 Pain in left hip: Secondary | ICD-10-CM | POA: Diagnosis not present

## 2022-06-23 DIAGNOSIS — Z96642 Presence of left artificial hip joint: Secondary | ICD-10-CM | POA: Diagnosis not present

## 2022-06-23 DIAGNOSIS — M25562 Pain in left knee: Secondary | ICD-10-CM | POA: Diagnosis not present

## 2022-06-23 DIAGNOSIS — Z96652 Presence of left artificial knee joint: Secondary | ICD-10-CM | POA: Diagnosis not present

## 2022-06-23 DIAGNOSIS — R0600 Dyspnea, unspecified: Secondary | ICD-10-CM | POA: Diagnosis not present

## 2022-06-23 DIAGNOSIS — R002 Palpitations: Secondary | ICD-10-CM | POA: Diagnosis not present

## 2022-06-23 DIAGNOSIS — I10 Essential (primary) hypertension: Secondary | ICD-10-CM | POA: Diagnosis not present

## 2022-06-26 DIAGNOSIS — Z23 Encounter for immunization: Secondary | ICD-10-CM | POA: Diagnosis not present

## 2022-06-30 ENCOUNTER — Ambulatory Visit: Payer: Medicare Other | Admitting: Neurology

## 2022-06-30 ENCOUNTER — Ambulatory Visit (INDEPENDENT_AMBULATORY_CARE_PROVIDER_SITE_OTHER): Payer: Medicare Other | Admitting: Neurology

## 2022-06-30 DIAGNOSIS — M62472 Contracture of muscle, left ankle and foot: Secondary | ICD-10-CM | POA: Diagnosis not present

## 2022-06-30 MED ORDER — ONABOTULINUMTOXINA 100 UNITS IJ SOLR: 100.0000 [IU] | Freq: Once | INTRAMUSCULAR | Status: DC

## 2022-06-30 NOTE — Progress Notes (Addendum)
TeleHealth Visit:  This visit was completed with telemedicine (audio/video) technology. [name] [name] has verbally consented to this TeleHealth visit. The patient is located at home, the provider is located at home. The participants in this visit include the listed provider and patient. The visit was conducted today via MyChart video.  OBESITY [name] [name] is here to discuss her progress with her obesity treatment plan along with follow-up of her obesity related diagnoses.    Today's visit was # 27  Starting weight: 258 lbs Starting date: 05/07/2018 Weight reported at last virtual office visit: 172.4 lbs on 06/03/22 Today's reported weight: 175.4 lbs  Total weight loss: 83 Weight change since last visit: +3  Today's blood pressure is 111/59, pulse is 80  Her goal weight is 169 pounds-29 BMI.  Nutrition Plan: practicing portion control and making smarter food choices, such as increasing vegetables and decreasing simple carbohydrates.   Current exercise: gym (30 cardio/bike/elliptical and 30-45 minutes weights) 4-5 days per week  Interim History: She got back from her [place] vacation a few weeks ago but has not gotten back to her normal eating habits.  Still struggling with eating too many carbohydrates and not enough protein. She is consistent with exercise despite having recent issue of some unusual dyspnea when walking short distances.  She has no issues when exercising.  She has upcoming Holter monitor and nuclear stress test to evaluate.  Assessment/Plan:  1. Other depression/emotional eating [name] [name] has had issues with stress/emotional eating. Currently this is well controlled. Overall mood is stable. Denies suicidal/homicidal ideation. Medication(s): Bupropion 200 mg twice daily  Plan: Refill bupropion 200 mg twice daily  2. Type II Diabetes with unspecified complications without long-term use of insulin HgbA1c is at goal. Last A1c was 5.2 on 10/23/2021. Medication(s): Ozempic 2 mg  weekly.  Does provide some appetite suppression.  Lab Results  Component Value Date   HGBA1C 5.2 10/23/2021   HGBA1C 4.8 06/11/2021   HGBA1C 4.7 (L) 12/12/2020   Lab Results  Component Value Date   LDLCALC 90 06/11/2021   CREATININE 1.04 (H) 04/24/2022    Plan: Refill Ozempic 2 mg weekly.   3. Obesity: Current BMI 31.08 [name] [name] is currently in the action stage of change. As such, her goal is to continue with weight loss efforts.  She has agreed to practicing portion control and making smarter food choices, such as increasing vegetables and decreasing simple carbohydrates.   Exercise goals: as is  Behavioral modification strategies: increasing lean protein intake, decreasing simple carbohydrates, and planning for success.  [name] [name] has agreed to follow-up with our clinic in 2 weeks.   No orders of the defined types were placed in this encounter.   Medications Discontinued During This Encounter  Medication Reason   Semaglutide, 2 MG/DOSE, 8 MG/3ML SOPN Reorder   buPROPion (WELLBUTRIN SR) 200 MG 12 hr tablet Reorder     Meds ordered this encounter  Medications   buPROPion (WELLBUTRIN SR) 200 MG 12 hr tablet    Sig: Take 1 tablet (200 mg total) by mouth 2 (two) times daily.    Dispense:  180 tablet    Refill:  0    Order Specific Question:   Supervising Provider    Answer:   Dell Ponto [[id number]]   Semaglutide, 2 MG/DOSE, 8 MG/3ML SOPN    Sig: Inject 2 mg as directed once a week.    Dispense:  9 mL    Refill:  0    Order Specific Question:   Supervising Provider  Answer:   Dell Ponto [[id number]]      Objective:   VITALS: Per patient if applicable, see vitals. GENERAL: Alert and in no acute distress. CARDIOPULMONARY: No increased WOB. Speaking in clear sentences.  PSYCH: Pleasant and cooperative. Speech normal rate and rhythm. Affect is appropriate. Insight and judgement are appropriate. Attention is focused, linear, and appropriate.  NEURO: Oriented as arrived to  appointment on time with no prompting.   Lab Results  Component Value Date   CREATININE 1.04 (H) 04/24/2022   BUN 17 04/24/2022   NA 130 (L) 04/24/2022   K 4.5 04/24/2022   CL 93 (L) 04/24/2022   CO2 24 04/24/2022   Lab Results  Component Value Date   ALT 16 06/11/2021   AST 20 06/11/2021   ALKPHOS 65 06/11/2021   BILITOT 0.3 06/11/2021   Lab Results  Component Value Date   HGBA1C 5.2 10/23/2021   HGBA1C 4.8 06/11/2021   HGBA1C 4.7 (L) 12/12/2020   HGBA1C 4.8 04/04/2020   HGBA1C 4.6 (L) 10/12/2019   Lab Results  Component Value Date   INSULIN 1.5 (L) 12/12/2020   INSULIN 4.0 04/04/2020   INSULIN 3.5 10/12/2019   INSULIN 6.9 08/02/2018   INSULIN 7.0 04/27/2018   Lab Results  Component Value Date   TSH 1.930 06/11/2021   Lab Results  Component Value Date   CHOL 197 06/11/2021   HDL 93 06/11/2021   LDLCALC 90 06/11/2021   TRIG 77 06/11/2021   CHOLHDL 2.7 04/09/2016   Lab Results  Component Value Date   WBC 5.4 06/11/2021   HGB 11.6 06/11/2021   HCT 35.6 06/11/2021   MCV 89 06/11/2021   PLT 261 06/11/2021   Lab Results  Component Value Date   IRON 68 06/11/2021   TIBC 299 06/11/2021   FERRITIN 53 06/11/2021   Lab Results  Component Value Date   VD25OH 51.4 06/11/2021   VD25OH 45.3 12/12/2020   VD25OH 44.6 06/27/2020    Attestation Statements:   Reviewed by clinician on day of visit: allergies, medications, problem list, medical history, surgical history, family history, social history, and previous encounter notes.

## 2022-06-30 NOTE — Progress Notes (Signed)
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 Hx of diabetic(per-diabetic) neuropathy.    Procedure: All procedures documented were medically necessary, reasonable and appropriate based on the patient's history, medical diagnosis and physician opinion. Verbal informed consent was obtained from the patient, patient was informed of potential risk of procedure, including bruising, bleeding, hematoma formation, infection, muscle weakness, muscle pain, numbness, transient hypertension, transient hyperglycemia and transient insomnia among others. All areas injected were topically clean with isopropyl rubbing alcohol. Nonsterile nonlatex gloves were worn during the procedure.    Injections were given unilateral to the left plantar surface of the foot medial surface ask her to point out the area of pain and the extensor digitorum brevis muscle and Tibials posterior. 100 units of Botox were used and none was wasted.   Dx: Y37.096  (In the future if we continue: [phone] CPT code and no additional limb today but [phone] for additional limb)

## 2022-06-30 NOTE — Progress Notes (Signed)
Botox- 100 units x 1 vials Lot: [id number] Expiration: 06/2024 NDC: 0221-7981-02  Bacteriostatic 0.9% Sodium Chloride- 68m total Lot: [id number] Expiration: 04/22/2023 NDC: [address]-17 Dx: MB30.104B/B

## 2022-07-01 ENCOUNTER — Other Ambulatory Visit (INDEPENDENT_AMBULATORY_CARE_PROVIDER_SITE_OTHER): Payer: Self-pay | Admitting: Family Medicine

## 2022-07-01 ENCOUNTER — Telehealth (INDEPENDENT_AMBULATORY_CARE_PROVIDER_SITE_OTHER): Payer: Self-pay | Admitting: Family Medicine

## 2022-07-01 ENCOUNTER — Encounter (INDEPENDENT_AMBULATORY_CARE_PROVIDER_SITE_OTHER): Payer: Self-pay | Admitting: Family Medicine

## 2022-07-01 ENCOUNTER — Telehealth (INDEPENDENT_AMBULATORY_CARE_PROVIDER_SITE_OTHER): Payer: Medicare Other | Admitting: Family Medicine

## 2022-07-01 VITALS — Ht 63.0 in | Wt 175.4 lb

## 2022-07-01 DIAGNOSIS — E1169 Type 2 diabetes mellitus with other specified complication: Secondary | ICD-10-CM | POA: Diagnosis not present

## 2022-07-01 DIAGNOSIS — F3289 Other specified depressive episodes: Secondary | ICD-10-CM

## 2022-07-01 DIAGNOSIS — E669 Obesity, unspecified: Secondary | ICD-10-CM | POA: Diagnosis not present

## 2022-07-01 DIAGNOSIS — Z6831 Body mass index (BMI) 31.0-31.9, adult: Secondary | ICD-10-CM | POA: Diagnosis not present

## 2022-07-01 DIAGNOSIS — Z7985 Long-term (current) use of injectable non-insulin antidiabetic drugs: Secondary | ICD-10-CM

## 2022-07-01 MED ORDER — BUPROPION HCL ER (SR) 200 MG PO TB12
200.0000 mg | ORAL_TABLET | Freq: Two times a day (BID) | ORAL | 0 refills | Status: DC
Start: 2022-07-01 — End: 2022-11-19

## 2022-07-01 MED ORDER — SEMAGLUTIDE (2 MG/DOSE) 8 MG/3ML ~~LOC~~ SOPN: 2.0000 mg | PEN_INJECTOR | SUBCUTANEOUS | 0 refills | Status: DC

## 2022-07-01 NOTE — Telephone Encounter (Signed)
[facility] called and stated that Ozempic 2 mg is out of stock. They do have the 1 mg in stock. Please call pharmacy to make adjustments for patient. The [facility] is [phone] ref# [id number]. Her prescription will be placed on hold until they hear from the Dr.

## 2022-07-05 ENCOUNTER — Other Ambulatory Visit (INDEPENDENT_AMBULATORY_CARE_PROVIDER_SITE_OTHER): Payer: Self-pay | Admitting: Family Medicine

## 2022-07-05 DIAGNOSIS — E1169 Type 2 diabetes mellitus with other specified complication: Secondary | ICD-10-CM

## 2022-07-05 MED ORDER — SEMAGLUTIDE (1 MG/DOSE) 4 MG/3ML ~~LOC~~ SOPN: 1.0000 mg | PEN_INJECTOR | SUBCUTANEOUS | 0 refills | Status: DC

## 2022-07-09 NOTE — Progress Notes (Signed)
TeleHealth Visit:  This visit was completed with telemedicine (audio/video) technology. [name] [name] has verbally consented to this TeleHealth visit. The patient is located at home, the provider is located at home. The participants in this visit include the listed provider and patient. The visit was conducted today via MyChart video.  OBESITY [name] [name] is here to discuss her progress with her obesity treatment plan along with follow-up of her obesity related diagnoses.    Today's visit was # 65  Starting weight: 258 lbs Starting date: 05/07/2018 Weight reported at last virtual office visit: 175.4 lbs on 07/01/22 Today's reported weight: 176 lbs  Total weight loss: 82 Weight change since last visit: +1 BP: 120/69 Pulse: 75  Her goal weight is 169 pounds-29 BMI.  Nutrition Plan: practicing portion control and making smarter food choices, such as increasing vegetables and decreasing simple carbohydrates.   Current exercise: gym (30 cardio/bike/elliptical and 30-40 minutes weights) 4-5 days per week  Interim History: She has been unable to get motivated to eat properly recently.  Ozempic is on backorder and appetite has increased tremendously.  She reports she is eating too many carbs and not eating enough protein.  Drinking too much Diet Coke and not enough water.  Consistent with exercise as always.  Assessment/Plan:  1. Hypertension associated with T2DM Hypertension well controlled.  Medication(s): lisinopril/HCTZ 100/25 mg,  BP Readings from Last 3 Encounters:  06/03/22 (!) 115/59  05/20/22 120/65  04/24/22 (!) 145/85   Lab Results  Component Value Date   CREATININE 1.04 (H) 04/24/2022   CREATININE 0.94 06/11/2021   CREATININE 0.67 12/12/2020   Plan: Continue lisinopril/HCTZ 100/25 mg weekly.   2. Type II Diabetes HgbA1c is at goal. Last A1c was 5.2 Medication(s): Ozempic 2 mg-has been off of this due to availability.  Pharmacy thinks they may get it in this  week. Appetite increased.  Lab Results  Component Value Date   HGBA1C 5.2 10/23/2021   HGBA1C 4.8 06/11/2021   HGBA1C 4.7 (L) 12/12/2020   Lab Results  Component Value Date   LDLCALC 90 06/11/2021   CREATININE 1.04 (H) 04/24/2022    Plan: Resume Ozempic when available.  3. Obesity: Current BMI 31.18 [name] [name] is currently in the action stage of change. As such, her goal is to continue with weight loss efforts.  She has agreed to practicing portion control and making smarter food choices, such as increasing vegetables and decreasing simple carbohydrates.   Exercise goals: as is  Behavioral modification strategies: increasing lean protein intake, decreasing simple carbohydrates, and planning for success.  [name] [name] has agreed to follow-up with our clinic in 4 weeks.   No orders of the defined types were placed in this encounter.   There are no discontinued medications.   No orders of the defined types were placed in this encounter.     Objective:   VITALS: Per patient if applicable, see vitals. GENERAL: Alert and in no acute distress. CARDIOPULMONARY: No increased WOB. Speaking in clear sentences.  PSYCH: Pleasant and cooperative. Speech normal rate and rhythm. Affect is appropriate. Insight and judgement are appropriate. Attention is focused, linear, and appropriate.  NEURO: Oriented as arrived to appointment on time with no prompting.   Lab Results  Component Value Date   CREATININE 1.04 (H) 04/24/2022   BUN 17 04/24/2022   NA 130 (L) 04/24/2022   K 4.5 04/24/2022   CL 93 (L) 04/24/2022   CO2 24 04/24/2022   Lab Results  Component Value Date   ALT 16  06/11/2021   AST 20 06/11/2021   ALKPHOS 65 06/11/2021   BILITOT 0.3 06/11/2021   Lab Results  Component Value Date   HGBA1C 5.2 10/23/2021   HGBA1C 4.8 06/11/2021   HGBA1C 4.7 (L) 12/12/2020   HGBA1C 4.8 04/04/2020   HGBA1C 4.6 (L) 10/12/2019   Lab Results  Component Value Date   INSULIN 1.5 (L) 12/12/2020    INSULIN 4.0 04/04/2020   INSULIN 3.5 10/12/2019   INSULIN 6.9 08/02/2018   INSULIN 7.0 04/27/2018   Lab Results  Component Value Date   TSH 1.930 06/11/2021   Lab Results  Component Value Date   CHOL 197 06/11/2021   HDL 93 06/11/2021   LDLCALC 90 06/11/2021   TRIG 77 06/11/2021   CHOLHDL 2.7 04/09/2016   Lab Results  Component Value Date   WBC 5.4 06/11/2021   HGB 11.6 06/11/2021   HCT 35.6 06/11/2021   MCV 89 06/11/2021   PLT 261 06/11/2021   Lab Results  Component Value Date   IRON 68 06/11/2021   TIBC 299 06/11/2021   FERRITIN 53 06/11/2021   Lab Results  Component Value Date   VD25OH 51.4 06/11/2021   VD25OH 45.3 12/12/2020   VD25OH 44.6 06/27/2020    Attestation Statements:   Reviewed by clinician on day of visit: allergies, medications, problem list, medical history, surgical history, family history, social history, and previous encounter notes.  Time spent on visit including the items listed below was 30 minutes.  -preparing to see the patient (e.g., review of tests, history, previous notes) -obtaining and/or reviewing separately obtained history -counseling and educating the patient/family/caregiver -documenting clinical information in the electronic or other health record

## 2022-07-10 DIAGNOSIS — R0600 Dyspnea, unspecified: Secondary | ICD-10-CM | POA: Diagnosis not present

## 2022-07-13 ENCOUNTER — Encounter (INDEPENDENT_AMBULATORY_CARE_PROVIDER_SITE_OTHER): Payer: Self-pay | Admitting: Family Medicine

## 2022-07-13 ENCOUNTER — Telehealth (INDEPENDENT_AMBULATORY_CARE_PROVIDER_SITE_OTHER): Payer: Medicare Other | Admitting: Family Medicine

## 2022-07-13 VITALS — Ht 63.0 in | Wt 176.0 lb

## 2022-07-13 DIAGNOSIS — E669 Obesity, unspecified: Secondary | ICD-10-CM

## 2022-07-13 DIAGNOSIS — E1169 Type 2 diabetes mellitus with other specified complication: Secondary | ICD-10-CM | POA: Diagnosis not present

## 2022-07-13 DIAGNOSIS — Z6831 Body mass index (BMI) 31.0-31.9, adult: Secondary | ICD-10-CM

## 2022-07-13 DIAGNOSIS — R0600 Dyspnea, unspecified: Secondary | ICD-10-CM | POA: Diagnosis not present

## 2022-07-13 DIAGNOSIS — I152 Hypertension secondary to endocrine disorders: Secondary | ICD-10-CM

## 2022-07-13 DIAGNOSIS — Z7985 Long-term (current) use of injectable non-insulin antidiabetic drugs: Secondary | ICD-10-CM

## 2022-07-13 DIAGNOSIS — R002 Palpitations: Secondary | ICD-10-CM | POA: Diagnosis not present

## 2022-07-13 DIAGNOSIS — I10 Essential (primary) hypertension: Secondary | ICD-10-CM | POA: Diagnosis not present

## 2022-07-13 DIAGNOSIS — Z8673 Personal history of transient ischemic attack (TIA), and cerebral infarction without residual deficits: Secondary | ICD-10-CM | POA: Diagnosis not present

## 2022-07-13 DIAGNOSIS — I350 Nonrheumatic aortic (valve) stenosis: Secondary | ICD-10-CM | POA: Diagnosis not present

## 2022-07-13 DIAGNOSIS — E1159 Type 2 diabetes mellitus with other circulatory complications: Secondary | ICD-10-CM

## 2022-07-14 ENCOUNTER — Telehealth (INDEPENDENT_AMBULATORY_CARE_PROVIDER_SITE_OTHER): Payer: Medicare Other | Admitting: Family Medicine

## 2022-07-14 ENCOUNTER — Other Ambulatory Visit (HOSPITAL_BASED_OUTPATIENT_CLINIC_OR_DEPARTMENT_OTHER): Payer: Self-pay

## 2022-07-14 ENCOUNTER — Other Ambulatory Visit (INDEPENDENT_AMBULATORY_CARE_PROVIDER_SITE_OTHER): Payer: Self-pay | Admitting: Family Medicine

## 2022-07-14 DIAGNOSIS — E1142 Type 2 diabetes mellitus with diabetic polyneuropathy: Secondary | ICD-10-CM | POA: Diagnosis not present

## 2022-07-14 DIAGNOSIS — E2839 Other primary ovarian failure: Secondary | ICD-10-CM | POA: Diagnosis not present

## 2022-07-14 DIAGNOSIS — B370 Candidal stomatitis: Secondary | ICD-10-CM | POA: Diagnosis not present

## 2022-07-14 DIAGNOSIS — E1169 Type 2 diabetes mellitus with other specified complication: Secondary | ICD-10-CM

## 2022-07-14 DIAGNOSIS — Z0000 Encounter for general adult medical examination without abnormal findings: Secondary | ICD-10-CM | POA: Diagnosis not present

## 2022-07-14 DIAGNOSIS — D352 Benign neoplasm of pituitary gland: Secondary | ICD-10-CM | POA: Diagnosis not present

## 2022-07-14 DIAGNOSIS — C641 Malignant neoplasm of right kidney, except renal pelvis: Secondary | ICD-10-CM | POA: Diagnosis not present

## 2022-07-14 DIAGNOSIS — E785 Hyperlipidemia, unspecified: Secondary | ICD-10-CM | POA: Diagnosis not present

## 2022-07-14 DIAGNOSIS — Z86718 Personal history of other venous thrombosis and embolism: Secondary | ICD-10-CM | POA: Diagnosis not present

## 2022-07-14 DIAGNOSIS — E538 Deficiency of other specified B group vitamins: Secondary | ICD-10-CM | POA: Diagnosis not present

## 2022-07-14 DIAGNOSIS — I10 Essential (primary) hypertension: Secondary | ICD-10-CM | POA: Diagnosis not present

## 2022-07-14 DIAGNOSIS — R413 Other amnesia: Secondary | ICD-10-CM | POA: Diagnosis not present

## 2022-07-14 MED ORDER — SEMAGLUTIDE (2 MG/DOSE) 8 MG/3ML ~~LOC~~ SOPN
2.0000 mg | PEN_INJECTOR | SUBCUTANEOUS | 0 refills | Status: DC
  Filled 2022-07-14: qty 3, 28d supply, fill #0

## 2022-07-20 DIAGNOSIS — R92323 Mammographic fibroglandular density, bilateral breasts: Secondary | ICD-10-CM | POA: Diagnosis not present

## 2022-07-20 DIAGNOSIS — Z1231 Encounter for screening mammogram for malignant neoplasm of breast: Secondary | ICD-10-CM | POA: Diagnosis not present

## 2022-07-20 DIAGNOSIS — M8589 Other specified disorders of bone density and structure, multiple sites: Secondary | ICD-10-CM | POA: Diagnosis not present

## 2022-08-11 DIAGNOSIS — R7989 Other specified abnormal findings of blood chemistry: Secondary | ICD-10-CM | POA: Diagnosis not present

## 2022-08-12 ENCOUNTER — Telehealth (INDEPENDENT_AMBULATORY_CARE_PROVIDER_SITE_OTHER): Payer: Medicare Other | Admitting: Family Medicine

## 2022-08-12 DIAGNOSIS — Z8673 Personal history of transient ischemic attack (TIA), and cerebral infarction without residual deficits: Secondary | ICD-10-CM | POA: Diagnosis not present

## 2022-08-12 DIAGNOSIS — R002 Palpitations: Secondary | ICD-10-CM | POA: Diagnosis not present

## 2022-09-03 DIAGNOSIS — Z87891 Personal history of nicotine dependence: Secondary | ICD-10-CM | POA: Diagnosis not present

## 2022-09-03 DIAGNOSIS — R918 Other nonspecific abnormal finding of lung field: Secondary | ICD-10-CM | POA: Diagnosis not present

## 2022-09-03 DIAGNOSIS — D3001 Benign neoplasm of right kidney: Secondary | ICD-10-CM | POA: Diagnosis not present

## 2022-09-03 DIAGNOSIS — Z08 Encounter for follow-up examination after completed treatment for malignant neoplasm: Secondary | ICD-10-CM | POA: Diagnosis not present

## 2022-09-03 DIAGNOSIS — Z85528 Personal history of other malignant neoplasm of kidney: Secondary | ICD-10-CM | POA: Diagnosis not present

## 2022-09-03 DIAGNOSIS — Z905 Acquired absence of kidney: Secondary | ICD-10-CM | POA: Diagnosis not present

## 2022-09-28 ENCOUNTER — Telehealth: Payer: Self-pay | Admitting: *Deleted

## 2022-09-28 NOTE — Telephone Encounter (Signed)
Patient needs auth confirmed. She has [employer] FEP w/ medicare Rx. Please make sure she is good to go for her appt. It is tomorrow 09/29/22. New card just got scanned in today.  Chemical Denervation of limbs and Trunk Muscles   Botox [id number] Units: 200  30076 and 22633   Diagnosis ICD CODE: M62.472

## 2022-09-29 ENCOUNTER — Ambulatory Visit (INDEPENDENT_AMBULATORY_CARE_PROVIDER_SITE_OTHER): Payer: Medicare Other | Admitting: Neurology

## 2022-09-29 DIAGNOSIS — M62472 Contracture of muscle, left ankle and foot: Secondary | ICD-10-CM | POA: Diagnosis not present

## 2022-09-29 DIAGNOSIS — R200 Anesthesia of skin: Secondary | ICD-10-CM

## 2022-09-29 DIAGNOSIS — R202 Paresthesia of skin: Secondary | ICD-10-CM

## 2022-09-29 MED ORDER — XARELTO 20 MG PO TABS: ORAL_TABLET | ORAL | 4 refills | Status: DC

## 2022-09-29 MED ORDER — ALPRAZOLAM 2 MG PO TABS: 2.0000 mg | ORAL_TABLET | Freq: Three times a day (TID) | ORAL | 1 refills | Status: DC | PRN

## 2022-09-29 MED ORDER — GABAPENTIN 300 MG PO CAPS: 600.0000 mg | ORAL_CAPSULE | Freq: Three times a day (TID) | ORAL | 3 refills | Status: DC

## 2022-09-29 MED ORDER — ONABOTULINUMTOXINA 200 UNITS IJ SOLR
200.0000 [IU] | Freq: Once | INTRAMUSCULAR | Status: AC
  Administered 2022-09-29: 200 [IU] via INTRAMUSCULAR

## 2022-09-29 NOTE — Telephone Encounter (Signed)
I called FEP [employer], spoke with [place] D (REF# [id number]). She said CPT codes [phone], [phone], and [phone] are covered under medical benefit, no authorization is required since patient has primary medicare. I also spoke with [employer] (REF# [id number]) and confirmed the Botox can continue to be billed under medical benefit as buy and bill. They waive pt's co-pay and coinsurance since patient has primary medicare.

## 2022-09-29 NOTE — Progress Notes (Unsigned)
Botox consent signed  Botox- 200 units x 1 vial Lot: [id number] Expiration: 01/2025 NDC: 4854-6270-35  Bacteriostatic 0.9% Sodium Chloride- 60m total Lot: [id number]: 05/23/2023 NDC: [address]-96 Dx: MV89.381B/B

## 2022-09-29 NOTE — Progress Notes (Unsigned)
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 Hx of diabetic(per-diabetic) neuropathy.    Procedure: All procedures documented were medically necessary, reasonable and appropriate based on the patient's history, medical diagnosis and physician opinion. Verbal informed consent was obtained from the patient, patient was informed of potential risk of procedure, including bruising, bleeding, hematoma formation, infection, muscle weakness, muscle pain, numbness, transient hypertension, transient hyperglycemia and transient insomnia among others. All areas injected were topically clean with isopropyl rubbing alcohol. Nonsterile nonlatex gloves were worn during the procedure.    Injections were given unilateral to the left plantar surface of the foot medial surface ask her to point out the area of pain and the extensor digitorum brevis muscle and Tibials posterior. 100 units of Botox were used and none was wasted.   Dx: H46.047  (In the future if we continue: [phone] CPT code and no additional limb today but [phone] for additional limb)   EMG/NCS UE *** bilateral uppers looking for ulnar neuopathy right > left and CTS

## 2022-10-26 DIAGNOSIS — I493 Ventricular premature depolarization: Secondary | ICD-10-CM | POA: Diagnosis not present

## 2022-10-26 DIAGNOSIS — I4729 Other ventricular tachycardia: Secondary | ICD-10-CM | POA: Diagnosis not present

## 2022-10-26 DIAGNOSIS — I253 Aneurysm of heart: Secondary | ICD-10-CM | POA: Diagnosis not present

## 2022-10-26 DIAGNOSIS — Z1330 Encounter for screening examination for mental health and behavioral disorders, unspecified: Secondary | ICD-10-CM | POA: Diagnosis not present

## 2022-10-26 DIAGNOSIS — E785 Hyperlipidemia, unspecified: Secondary | ICD-10-CM | POA: Diagnosis not present

## 2022-10-26 DIAGNOSIS — I10 Essential (primary) hypertension: Secondary | ICD-10-CM | POA: Diagnosis not present

## 2022-10-26 DIAGNOSIS — Z8673 Personal history of transient ischemic attack (TIA), and cerebral infarction without residual deficits: Secondary | ICD-10-CM | POA: Diagnosis not present

## 2022-10-26 DIAGNOSIS — I350 Nonrheumatic aortic (valve) stenosis: Secondary | ICD-10-CM | POA: Diagnosis not present

## 2022-10-26 DIAGNOSIS — I491 Atrial premature depolarization: Secondary | ICD-10-CM | POA: Diagnosis not present

## 2022-10-28 ENCOUNTER — Encounter: Payer: Self-pay | Admitting: Podiatry

## 2022-10-28 ENCOUNTER — Ambulatory Visit (INDEPENDENT_AMBULATORY_CARE_PROVIDER_SITE_OTHER): Payer: Medicare Other | Admitting: Podiatry

## 2022-10-28 DIAGNOSIS — D689 Coagulation defect, unspecified: Secondary | ICD-10-CM | POA: Diagnosis not present

## 2022-10-28 DIAGNOSIS — L84 Corns and callosities: Secondary | ICD-10-CM | POA: Diagnosis not present

## 2022-10-29 ENCOUNTER — Ambulatory Visit (INDEPENDENT_AMBULATORY_CARE_PROVIDER_SITE_OTHER): Payer: Medicare Other | Admitting: Neurology

## 2022-10-29 VITALS — BP 165/89 | HR 74 | Ht 63.0 in | Wt 178.0 lb

## 2022-10-29 DIAGNOSIS — G959 Disease of spinal cord, unspecified: Secondary | ICD-10-CM | POA: Diagnosis not present

## 2022-10-29 DIAGNOSIS — M5412 Radiculopathy, cervical region: Secondary | ICD-10-CM

## 2022-10-29 DIAGNOSIS — R29898 Other symptoms and signs involving the musculoskeletal system: Secondary | ICD-10-CM | POA: Diagnosis not present

## 2022-10-29 DIAGNOSIS — R202 Paresthesia of skin: Secondary | ICD-10-CM

## 2022-10-29 DIAGNOSIS — R200 Anesthesia of skin: Secondary | ICD-10-CM | POA: Diagnosis not present

## 2022-10-29 DIAGNOSIS — G5601 Carpal tunnel syndrome, right upper limb: Secondary | ICD-10-CM

## 2022-10-29 NOTE — Progress Notes (Signed)
Full Name: [name] [name] Gender: Female MRN #: [id number] Date of Birth: 1951-08-29    Visit Date: 10/29/2022 12:21 Age: 72 Years Examining Physician: Dr. Sarina Ill Referring Physician: Dr. Sarina Ill Height: 5 feet 3 inch  History: patient with arm weakness bilaterally and paresthesias in her entire body. She has a hx of CTS on the right and wears a brace. No etiology for her 4-limb weakness and paresthesias found.  Summary: The left median motor nerve was within normal limits.  The right median motor nerve showed delayed distal onset latency (4.7 ms, normal less than 4.4).  The left ulnar ADM motor nerve was within normal limits.  The right ulnar ADM motor nerve was with within normal limits.  The right radial sensory nerve was within normal limits. the left median orthodromic sensory nerve was within normal limits.  The right median orthodromic sensory nerve showed decreased amplitude (3 V, normal greater than 10).  The left ulnar orthodromic sensory nerve was within normal limits.  The right ulnar orthodromic sensory nerve was within normal limits. The right median/ulnar (palm) comparison nerve showed prolonged distal peak latency (Median Palm, 3.0 ms, N<2.2) and abnormal peak latency difference (Median Palm-Ulnar Palm, 1.0 ms, N<0.4) with a relative median delay.  All remaining nerves (as indicated in the following tables) were within normal limits.  The right cervical paraspinal muscles showed polyphasic motor units and diminished motor unit recruitment.All remaining muscles (as indicated in the following tables) were within normal limits.      Conclusion:  There is moderately severe right Carpal Tunnel Syndrome.  Chronic denervation of the cervical paraspinals could be indicative of cervical radiculopathy or underlying myelopathy given patient's widespread paresthesias and reported 4-limb weakness. Recommend MRI Cervical Spine.      ------------------------------- Sarina Ill, M.D.  [facility] [address] [place], [place] [address] Tel: [phone] Fax: [phone]  Verbal informed consent was obtained from the patient, patient was informed of potential risk of procedure, including bruising, bleeding, hematoma formation, infection, muscle weakness, muscle pain, numbness, among others.        [place]    Nerve / Sites Muscle Latency Ref. Amplitude Ref. Rel Amp Segments Distance Velocity Ref. Area    ms ms mV mV %  cm m/s m/s mVms  L Median - APB     Wrist APB 3.9 ?4.4 10.1 ?4.0 100 Wrist - APB 7   28.0     Upper arm APB 8.3  7.5  74.5 Upper arm - Wrist 23 52 ?49 23.1  R Median - APB     Wrist APB 4.7 ?4.4 5.6 ?4.0 100 Wrist - APB 7   13.2     Upper arm APB 9.4  8.9  159 Upper arm - Wrist 24 51 ?49 22.1  L Ulnar - ADM     Wrist ADM 3.0 ?3.3 8.7 ?6.0 100 Wrist - ADM 7   26.6     B.Elbow ADM 4.6  8.6  99.1 B.Elbow - Wrist 8 49 ?49 25.0     A.Elbow ADM 8.3  8.2  94.6 A.Elbow - B.Elbow 17 47 ?49 23.0  R Ulnar - ADM     Wrist ADM 3.1 ?3.3 10.1 ?6.0 100 Wrist - ADM 7   26.7     B.Elbow ADM 5.5  9.4  92.9 B.Elbow - Wrist 12 50 ?49 24.2     A.Elbow ADM 8.2  8.3  88.1 A.Elbow - B.Elbow 14  53 ?49 22.0             SNC    Nerve / Sites Rec. Site Peak Lat Ref.  Amp Ref. Segments Distance Peak Diff Ref.    ms ms V V  cm ms ms  R Radial - Anatomical snuff box (Forearm)     Forearm Wrist 2.4 ?2.9 15 ?15 Forearm - Wrist 10    L Median, Ulnar - Transcarpal comparison     Median Palm Wrist 2.4 ?2.2 44 ?35 Median Palm - Wrist 8       Ulnar Palm Wrist 2.1 ?2.2 12 ?12 Ulnar Palm - Wrist 8          Median Palm - Ulnar Palm  0.3 ?0.4  R Median, Ulnar - Transcarpal comparison     Median Palm Wrist 3.0 ?2.2 50 ?35 Median Palm - Wrist 8       Ulnar Palm Wrist 2.0 ?2.2 12 ?12 Ulnar Palm - Wrist 8          Median Palm - Ulnar Palm  1.0 ?0.4  L Median - Orthodromic (Dig II, Mid palm)     Dig II Wrist 3.4 ?3.4 11 ?10 Dig II - Wrist 13    R  Median - Orthodromic (Dig II, Mid palm)     Dig II Wrist 2.2 ?3.4 3 ?10 Dig II - Wrist 13    L Ulnar - Orthodromic, (Dig V, Mid palm)     Dig V Wrist 3.0 ?3.1 5 ?5 Dig V - Wrist 11    R Ulnar - Orthodromic, (Dig V, Mid palm)     Dig V Wrist 2.1 ?3.1 7 ?5 Dig V - Wrist 81                     F  Wave    Nerve F Lat Ref.   ms ms  L Ulnar - ADM 28.4 ?32.0  R Ulnar - ADM 28.0 ?32.0         EMG Summary Table    Spontaneous MUAP Recruitment  Muscle IA Fib PSW Fasc Other Amp Dur. Poly Pattern  R. Cervical paraspinals (low) Normal None None None _______ Normal Normal 2+ Reduced  R. Deltoid Normal None None None _______ Normal Normal Normal Normal  R. Triceps brachii Normal None None None _______ Normal Normal Normal Normal  R. First dorsal interosseous Normal None None None _______ Normal Normal Normal Normal  R. Opponens pollicis Normal None None None _______ Normal Normal Normal Normal  R. Pronator teres Normal None None None _______ Normal Normal Normal Normal  R. Extensor indicis proprius Normal None None None _______ Normal Normal Normal Normal

## 2022-10-29 NOTE — Progress Notes (Signed)
Subjective:   Patient ID: [name] [name], female   DOB: 72 y.o.   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

## 2022-11-02 DIAGNOSIS — I10 Essential (primary) hypertension: Secondary | ICD-10-CM | POA: Diagnosis not present

## 2022-11-02 DIAGNOSIS — H35373 Puckering of macula, bilateral: Secondary | ICD-10-CM | POA: Diagnosis not present

## 2022-11-02 DIAGNOSIS — H353131 Nonexudative age-related macular degeneration, bilateral, early dry stage: Secondary | ICD-10-CM | POA: Diagnosis not present

## 2022-11-02 DIAGNOSIS — H43813 Vitreous degeneration, bilateral: Secondary | ICD-10-CM | POA: Diagnosis not present

## 2022-11-02 DIAGNOSIS — E119 Type 2 diabetes mellitus without complications: Secondary | ICD-10-CM | POA: Diagnosis not present

## 2022-11-03 DIAGNOSIS — G5601 Carpal tunnel syndrome, right upper limb: Secondary | ICD-10-CM | POA: Insufficient documentation

## 2022-11-03 NOTE — Progress Notes (Signed)
History: patient with arm weakness bilaterally and paresthesias in her entire body. She has a hx of CTS on the right and wears a brace. No etiology for her 4-limb weakness and paresthesias found. She reports hand and arm and leg chronic numbness, tinging, weakness. I reviewed results with patient. She would like to continue bracing her right wrist and declines referral to surgery, discussed there can be permanent nerve/muscle damage if not corrected. Also discussed the chronic neurogenic changes seen in the cervical paraspinal muscles, considering her widespread paresthesias and weakness need to check for myelopathy or central disorder given no peripheral cause found. Discussed with patient and answered all questions.      Conclusion:  There is moderately severe right Carpal Tunnel Syndrome.  Chronic denervation of the cervical paraspinals could be indicative of cervical radiculopathy or underlying myelopathy given patient's widespread paresthesias and reported 4-limb weakness. Recommend MRI Cervical Spine.   Orders Placed This Encounter  Procedures   MR CERVICAL SPINE WO CONTRAST   I spent 20 minutes of face-to-face and non-face-to-face time with patient on the  1. Right carpal tunnel syndrome   2. Numbness and tingling of hand   3. Cervical radiculopathy   4. Cervical myelopathy with cervical radiculopathy (HCC)   5. Bilateral numbness and tingling of arms and legs   6. Bilateral arm weakness   7. Bilateral leg weakness    diagnosis.  This included previsit chart review, lab review, study review, order entry, electronic health record documentation, patient education on the different diagnostic and therapeutic options, counseling and coordination of care, risks and benefits of management, compliance, or risk factor reduction. This does not include time spent on emg/ncs

## 2022-11-03 NOTE — Procedures (Signed)
Full Name: [name] [name] Gender: Female MRN #: [id number] Date of Birth: November 30, 1950    Visit Date: 10/29/2022 12:21 Age: 72 Years Examining Physician: Dr. Sarina Ill Referring Physician: Dr. Sarina Ill Height: 5 feet 3 inch  History: patient with arm weakness bilaterally and paresthesias in her entire body. She has a hx of CTS on the right and wears a brace. No etiology for her 4-limb weakness and paresthesias found.  Summary: The left median motor nerve was within normal limits.  The right median motor nerve showed delayed distal onset latency (4.7 ms, normal less than 4.4).  The left ulnar ADM motor nerve was within normal limits.  The right ulnar ADM motor nerve was with within normal limits.  The right radial sensory nerve was within normal limits. the left median orthodromic sensory nerve was within normal limits.  The right median orthodromic sensory nerve showed decreased amplitude (3 V, normal greater than 10).  The left ulnar orthodromic sensory nerve was within normal limits.  The right ulnar orthodromic sensory nerve was within normal limits. The right median/ulnar (palm) comparison nerve showed prolonged distal peak latency (Median Palm, 3.0 ms, N<2.2) and abnormal peak latency difference (Median Palm-Ulnar Palm, 1.0 ms, N<0.4) with a relative median delay.  All remaining nerves (as indicated in the following tables) were within normal limits.  The right cervical paraspinal muscles showed polyphasic motor units and diminished motor unit recruitment.All remaining muscles (as indicated in the following tables) were within normal limits.      Conclusion:  There is moderately severe right Carpal Tunnel Syndrome.  Chronic denervation of the cervical paraspinals could be indicative of cervical radiculopathy or underlying myelopathy given patient's widespread paresthesias and reported 4-limb weakness. Recommend MRI Cervical Spine.      ------------------------------- Sarina Ill, M.D.  [facility] [address], [place], [place] [address] Tel: [phone] Fax: [phone]  Verbal informed consent was obtained from the patient, patient was informed of potential risk of procedure, including bruising, bleeding, hematoma formation, infection, muscle weakness, muscle pain, numbness, among others.        [place]    Nerve / Sites Muscle Latency Ref. Amplitude Ref. Rel Amp Segments Distance Velocity Ref. Area    ms ms mV mV %  cm m/s m/s mVms  L Median - APB     Wrist APB 3.9 ?4.4 10.1 ?4.0 100 Wrist - APB 7   28.0     Upper arm APB 8.3  7.5  74.5 Upper arm - Wrist 23 52 ?49 23.1  R Median - APB     Wrist APB 4.7 ?4.4 5.6 ?4.0 100 Wrist - APB 7   13.2     Upper arm APB 9.4  8.9  159 Upper arm - Wrist 24 51 ?49 22.1  L Ulnar - ADM     Wrist ADM 3.0 ?3.3 8.7 ?6.0 100 Wrist - ADM 7   26.6     B.Elbow ADM 4.6  8.6  99.1 B.Elbow - Wrist 8 49 ?49 25.0     A.Elbow ADM 8.3  8.2  94.6 A.Elbow - B.Elbow 17 47 ?49 23.0  R Ulnar - ADM     Wrist ADM 3.1 ?3.3 10.1 ?6.0 100 Wrist - ADM 7   26.7     B.Elbow ADM 5.5  9.4  92.9 B.Elbow - Wrist 12 50 ?49 24.2     A.Elbow ADM 8.2  8.3  88.1 A.Elbow - B.Elbow 14  53 ?49 22.0             SNC    Nerve / Sites Rec. Site Peak Lat Ref.  Amp Ref. Segments Distance Peak Diff Ref.    ms ms V V  cm ms ms  R Radial - Anatomical snuff box (Forearm)     Forearm Wrist 2.4 ?2.9 15 ?15 Forearm - Wrist 10    L Median, Ulnar - Transcarpal comparison     Median Palm Wrist 2.4 ?2.2 44 ?35 Median Palm - Wrist 8       Ulnar Palm Wrist 2.1 ?2.2 12 ?12 Ulnar Palm - Wrist 8          Median Palm - Ulnar Palm  0.3 ?0.4  R Median, Ulnar - Transcarpal comparison     Median Palm Wrist 3.0 ?2.2 50 ?35 Median Palm - Wrist 8       Ulnar Palm Wrist 2.0 ?2.2 12 ?12 Ulnar Palm - Wrist 8          Median Palm - Ulnar Palm  1.0 ?0.4  L Median - Orthodromic (Dig II, Mid palm)     Dig II Wrist 3.4 ?3.4 11 ?10 Dig II - Wrist 13    R  Median - Orthodromic (Dig II, Mid palm)     Dig II Wrist 2.2 ?3.4 3 ?10 Dig II - Wrist 13    L Ulnar - Orthodromic, (Dig V, Mid palm)     Dig V Wrist 3.0 ?3.1 5 ?5 Dig V - Wrist 11    R Ulnar - Orthodromic, (Dig V, Mid palm)     Dig V Wrist 2.1 ?3.1 7 ?5 Dig V - Wrist 91                     F  Wave    Nerve F Lat Ref.   ms ms  L Ulnar - ADM 28.4 ?32.0  R Ulnar - ADM 28.0 ?32.0         EMG Summary Table    Spontaneous MUAP Recruitment  Muscle IA Fib PSW Fasc Other Amp Dur. Poly Pattern  R. Cervical paraspinals (low) Normal None None None _______ Normal Normal 2+ Reduced  R. Deltoid Normal None None None _______ Normal Normal Normal Normal  R. Triceps brachii Normal None None None _______ Normal Normal Normal Normal  R. First dorsal interosseous Normal None None None _______ Normal Normal Normal Normal  R. Opponens pollicis Normal None None None _______ Normal Normal Normal Normal  R. Pronator teres Normal None None None _______ Normal Normal Normal Normal  R. Extensor indicis proprius Normal None None None _______ Normal Normal Normal Normal

## 2022-11-04 ENCOUNTER — Telehealth: Payer: Self-pay | Admitting: Neurology

## 2022-11-04 NOTE — Telephone Encounter (Signed)
medicare/BCBS federal NPR sent to [facility] [phone]

## 2022-11-11 ENCOUNTER — Telehealth (INDEPENDENT_AMBULATORY_CARE_PROVIDER_SITE_OTHER): Payer: Medicare Other | Admitting: Family Medicine

## 2022-11-12 ENCOUNTER — Telehealth: Payer: Self-pay | Admitting: Neurology

## 2022-11-12 ENCOUNTER — Ambulatory Visit
Admission: RE | Admit: 2022-11-12 | Discharge: 2022-11-12 | Disposition: A | Discharge status: Home / Self Care | Payer: Medicare Other | Source: Ambulatory Visit | Attending: Neurology | Admitting: Neurology

## 2022-11-12 DIAGNOSIS — R29898 Other symptoms and signs involving the musculoskeletal system: Secondary | ICD-10-CM

## 2022-11-12 DIAGNOSIS — M4800 Spinal stenosis, site unspecified: Secondary | ICD-10-CM

## 2022-11-12 DIAGNOSIS — M4802 Spinal stenosis, cervical region: Secondary | ICD-10-CM | POA: Diagnosis not present

## 2022-11-12 DIAGNOSIS — M5412 Radiculopathy, cervical region: Secondary | ICD-10-CM

## 2022-11-12 DIAGNOSIS — G959 Disease of spinal cord, unspecified: Secondary | ICD-10-CM

## 2022-11-12 DIAGNOSIS — R202 Paresthesia of skin: Secondary | ICD-10-CM

## 2022-11-12 DIAGNOSIS — R200 Anesthesia of skin: Secondary | ICD-10-CM

## 2022-11-12 NOTE — Telephone Encounter (Addendum)
Spoke with patient. She states she has been having frequent issues with hand numbness which she talked to Dr Jaynee Eagles about previously. She also notes intermittent pain/burning from the inside in some of her fingers, for example, the last 3 fingers of her right hand are bothering her right now. She is amenable to a referral to neurosurgery. 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

## 2022-11-12 NOTE — Addendum Note (Signed)
Addended by: Gildardo Griffes on: 11/12/2022 05:48 PM   Modules accepted: Orders

## 2022-11-12 NOTE — Telephone Encounter (Signed)
Referral to Dr Kathyrn Sheriff placed.

## 2022-11-12 NOTE — Telephone Encounter (Signed)
Please refer to dr Kathyrn Sheriff and he can refer to dr Davy Pique as he sees fit thanks

## 2022-11-12 NOTE — Telephone Encounter (Signed)
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 You want to be evaluated by neurosurgery for surgery? or we can send you to Dr. Davy Pique for injections in the neck which may help with neck pain? I would recommend injections first if you think the pain is bad enough you need them. Or PT. Dr Jaynee Eagles

## 2022-11-16 ENCOUNTER — Telehealth: Payer: Self-pay | Admitting: Neurology

## 2022-11-16 NOTE — Telephone Encounter (Signed)
Referral for neurosurgery fax to [facility]. Phone: [phone], Fax: [phone]

## 2022-11-17 ENCOUNTER — Encounter: Payer: Medicare Other | Attending: Psychology | Admitting: Psychology

## 2022-11-17 DIAGNOSIS — I6340 Cerebral infarction due to embolism of unspecified cerebral artery: Secondary | ICD-10-CM | POA: Insufficient documentation

## 2022-11-17 DIAGNOSIS — E1159 Type 2 diabetes mellitus with other circulatory complications: Secondary | ICD-10-CM | POA: Insufficient documentation

## 2022-11-17 DIAGNOSIS — F09 Unspecified mental disorder due to known physiological condition: Secondary | ICD-10-CM

## 2022-11-17 DIAGNOSIS — I152 Hypertension secondary to endocrine disorders: Secondary | ICD-10-CM

## 2022-11-17 NOTE — Progress Notes (Signed)
Neuropsychological Consultation   Patient:   [name] [name]   DOB:   1950/10/02  MR Number:  [id number]  Location:  [facility] [place], [place] [id number] [place] [address] Dept: [phone]           Date of Service:   11/17/2022  Location of Service and Individuals present: Today's visit was conducted in my outpatient clinic office with the patient myself present.  Start Time:   8 AM End Time:   10 AM  150 minutes was spent with formal face-to-face clinical interview and the other 45 minutes was spent with record review, report writing and setting up testing protocols.  Patient Consent and Confidentiality:  Consent for Evaluation and Treatment:  Signed:  Yes Explanation of Privacy Policies:  Signed:  Yes Discussion of Confidentiality Limits:  Yes  Provider/Observer:  Ilean Skill, Psy.D.       Clinical Neuropsychologist       Billing Code/Service: [phone]  Chief Complaint:     Chief Complaint  Patient presents with   Memory Loss   Cerebrovascular Accident   Other    Word Finding/Expressive Language change    Reason for Service:    [name] [name] is a 72 year old female referred for neuropsychological evaluation by the patient's treating neurologist Sarina Ill, MD due to ongoing concerns around cognitive changes including changes both acutely as well as residual changes in expressive language and word finding capacity, changes in memory and learning.  The patient has had long-term issues with peripheral neuropathy as well as previous TIAs and small strokes.  Patient reports that while her symptoms that have developed suddenly after small stroke events have improved there are residual changes related to memory and expressive language functioning.  Patient has had consideration for MS reviewed with no indication of demyelinating type of lesions noted on  multiple MRIs and other studies to rule out potential of MS.  Patient does have previous episodes of B12 deficiency potentially creating some neuropathy and diabetic polyneuropathy and indications of microvascular ischemic changes and white matter regions of her brain along with residual of small stroke effects identified in neuroimaging.  Medical History:   Past Medical History:  Diagnosis Date   Anemia    Arthritis    "knees, hips, possibly back" (04/08/2016)   Atrial septal aneurysm    B12 deficiency    a. following gastric bypass.   Back pain    Cancer of kidney (HCC)    Cataract    Chronic lower back pain    spondylosis   Cold feet    Daily headache    "short ones for the last month or 2" (04/08/2016)   Depression    Diabetes (HCC)    Dry mouth    DVT (deep venous thrombosis) (HCC)    Easy bruising    Floaters in visual field    GERD (gastroesophageal reflux disease)    takes Protonix daily "just to protect my stomach; not for reflux" (04/08/2016)   Hay fever    Heart murmur    "dx'd by 1 anesthesiologist years and years ago"   History of loop recorder 03/2016   Hyperlipidemia    Hypertension    Joint pain    Leg cramp    Macular degeneration    "just watching"   Neuropathy    Nosebleed    Palpitations    Peripheral edema    Peripheral  neuropathy    not on any meds   Pituitary mass [facility])    a. s/p endoscopic surgery 02/2016.   Pneumonia 12/2017   Sleep apnea    no CPAP since weight loss after bariatric surgery '12 (04/08/2016)   Spondylosis    Stroke ([place]) 04/07/2016   a. 01/2016 and 03/2016. during admission had + LE DVT.   Swelling of extremity    TIA (transient ischemic attack)    Trouble in sleeping    Urinary incontinence    Weakness          Patient Active Problem List   Diagnosis Date Noted   Right carpal tunnel syndrome 11/03/2022   Contracture of muscle, right ankle and foot 04/13/2022   Disease related peripheral neuropathy 04/13/2022   Eating  disorder 11/06/2021   Diabetic polyneuropathy associated with type 2 diabetes mellitus ([place]) 02/05/2021   Bariatric surgery status 01/02/2021   Coronary artery disease 01/02/2021   Decreased estrogen level 01/02/2021   Glaucoma 01/02/2021   History of diabetes mellitus 01/02/2021   Long term (current) use of anticoagulants 01/02/2021   Obstructive sleep apnea 01/02/2021   Osteoarthritis 01/02/2021   Personal history of transient ischemic attack (TIA), and cerebral infarction without residual deficits 01/02/2021   Hyperlipidemia associated with type 2 diabetes mellitus ([place]) 04/10/2020   B12 deficiency 10/12/2019   Depression 08/24/2019   Encounter for counseling 07/11/2019   Peripheral edema 06/07/2019   Other insomnia 05/22/2019   Lower extremity edema 05/08/2019   Idiopathic peripheral neuropathy 05/08/2019   Intestinal malabsorption, unspecified 11/07/2018   Other hyperlipidemia 11/07/2018   Vitamin D deficiency 11/07/2018   Other constipation 10/24/2018   Pituitary adenoma ([place]) 09/22/2018   Aortic valve calcification 10/19/2016   Heart murmur 10/06/2016   Atrial septal aneurysm    DVT of deep femoral vein ([place]) 04/10/2016   Cerebrovascular accident (CVA) due to embolism of cerebral artery ([place])    Expressive aphasia 04/09/2016   Anemia 04/09/2016   Hypocalcemia 04/09/2016   CVA (cerebral infarction) 04/08/2016   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 ([place]) 03/30/2016   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  Patient noted acute word finding difficulties.  She reports that she could not come up with the word for curtains and had to physically point to them after an extended period of time coming up with name.  Patient presented to the emergency department and had an MRI around this time.  The patient reports that she started noticing more memory issues around the same time.  Patient reports that both these expressive language and memory changes have improved over the past year or so they continue to be more problematic than they had been prior.  Patient reports that she now knows she has had several strokes and several TIAs previously.  The patient reports that 2 of her mild stroke events resulted in acute symptoms.  One was during a period  of very high blood pressure.  Patient has also had times of vertigo/dizziness and has had physical therapy and vestibular therapy conducted.  Patient also has a prescription for alprazolam.  Progression of Symptoms: Patient has had issues with peripheral neuropathy for some time and had acute worsening of word finding and memory issues that have improved over time.  Patient also has tremors in both hands with greater tremor in right versus left.  Patient reports that this is relatively mild and is at use.  Patient's mother and grandmother also had tremor as well.  Patient notes that she has had times with some limited episodes of visual hallucination at night and other times where she will just think something is there.  This is likely associated to significant sleep deficits.  Triggering Factors: Patient has had 3 small stroke events that she describes as TIAs although there have been imaging results showing the small stroke events.  1 of these events was diagnosed in May 2017 and a second and third events were noted in July 2017.  The patient does not know when one of them occurred but was found in MRI.  Patient  reports that her second stroke event was associated with high blood pressure.  The third stroke event was with the acute aphasic symptoms.  Associated Symptoms (e.g., cognitive, emotional, behavioral): Patient denies any significant change in her mood state and denies depression or anxiety type symptoms.  Patient feels like she is improved over the past year or 2.  Additional Tests and Measures from other records:  Neuroimaging Results: Patient has had multiple MRI/CT scans over the past 6 or 7 years and I will not review all of them.  Patient's most recent MRI identified the pituitary tumor that has been followed over time and is remained unchanged over the past several years.  MRI has identified extensive T2/FLAIR hyperintense foci predominantly in deep white matter with some foci in the subcortical and periventricular white matter.  MRI is also continued to demonstrate a couple of small lacunar infarctions intermixed in the white matter regions.  There were changes noted in right occipital lobe consistent with remote hemorrhagic event.  There also additional foci noted in the left thalamus and the pons region.  No additional lesions were identified in white matter.  The extent of T2/FLAIR hyperintense foci were consistent with moderately severe chronic microvascular ischemic change that was greater than typical for age.  Laboratory Tests: Patient has had indications of low vitamin B12 in the past but recent studies over the past 4 years have all been within normal limits are high with supplements.  Patient's folate acid was also been within normal limits recently.  Vitamin D has been within normal limits.  Patient has had low insulin levels noted in studies.  Current Typical Mood State:  Appropriate patient does have a history of some depressive type symptoms and anxiety/dizziness type symptoms.  Patient is prescription for alprazolam and bupropion for mood state.  Patient denies any acute or ongoing  significant depression or anxiety type symptoms.  Sleep: Patient describes significant and severe sleep disturbance.  Patient reports that she may get between 5 and 5 and half hours of sleep at night.  Patient reports that she will sleep for 20 minutes roughly and then the for roughly 2 hours.  Patient reports that sometimes she is able to go back to sleep and sometimes she does not.  The patient describes lots of waking episodes and bouts of significant insomnia.  Patient has been assessed for obstructive sleep apnea which has been a concern but recent nighttime blood oxygen levels were within normal limits.  Diet Pattern: Patient admits to a fairly poor diet at times and has had a good diet with attempts of weight loss and past bariatric surgery.  Bariatric surgery was associated with decreased B12 which is addressed through supplements.  Patient does not always follow dietary plans developed around her bariatric surgery.  Behavioral Observation/Mental Status:   [name] [name]  presents as a 72 y.o.-year-old Right handed Caucasian Female who appeared her stated age. her dress was Appropriate and she was Well Groomed and her manners were Appropriate to the situation.  her participation was indicative of Appropriate behaviors.  There were not physical disabilities noted.  she displayed an appropriate level of cooperation and motivation.    Interactions:    Active Appropriate  Attention:   within normal limits and attention span and concentration were age appropriate  Memory:   within normal limits; recent and remote memory intact  Visuo-spatial:   not examined  Speech (Volume):  normal  Speech:   normal; normal  Thought Process:  Coherent and Relevant  Organized and Rational  Though Content:  WNL; not suicidal and not homicidal  Orientation:   person, place, time/date, and  situation  Judgment:   Good  Planning:   Good  Affect:    Appropriate  Mood:    Euthymic  Insight:   Good  Intelligence:   normal  Marital Status/Living:  Patient was born and raised in [place] with no siblings.  Pregnancy and delivery was appropriate without complication and the patient weighed 7 pounds 2 ounces at birth.  Normal childhood illnesses were noted.  Patient currently lives alone.  Patient is single and has no children.  Educational and Occupational History:     Highest Level of Education:   Patient graduated from high school and completed her associates degree.  Patient reports that she did well in all academic subjects except some aspects and higher level math classes.  Patient never repeated any grades.  Extracurricular activities included participation in [employer] and drama club.  Patient was in the beta club and scholastic letters.  Extra education was conducted in fashion merchandising where she received her associates degree.  Current Occupation:    Patient is currently retired.  Work History:   Patient worked at the [employer] for many years.  She was never terminated from her job.  Hobbies and Interests: Hobbies and interest have included reading and travel.  Impact of Symptoms on Work or School:  Patient is retired.  Impact of Symptoms on Social Functioning and Interpersonal Relationships: Patient does not describe these changes in functioning to have any significant impact on her social or interpersonal functioning.   Psychiatric History:    Abuse/Trauma History: No description of prior traumatic or abusive experiences.  Previous Diagnoses: Patient has been diagnosed with depression and some anxiety type symptoms and currently takes alprazolam particularly for her dizziness on acute episode as well as taking Wellbutrin.  History of Substance Use or Abuse:  No concerns of substance abuse are  reported.  Patient reports that she has between 2 and 3 alcoholic drinks per week and was a previous smoker quitting in 2007.  Mental Health Hospitalizations:  No   Family Med/Psych History:  Family History  Problem Relation Age of Onset   Neuropathy Mother  Hypertension Mother    Hyperlipidemia Mother    Thyroid disease Mother    Prostate cancer Father    Lymphoma Father    Hypertension Father    Hyperlipidemia Father    Cancer Father    Obesity Father    Brain cancer Father    Congestive Heart Failure Maternal Grandmother    Lung cancer Maternal Grandfather    Stomach cancer Neg Hx    Colon cancer Neg Hx    Esophageal cancer Neg Hx    Pancreatic cancer Neg Hx    Impression/DX:   [name] [name] is a 72 year old female referred for neuropsychological evaluation by the patient's treating neurologist Sarina Ill, MD due to ongoing concerns around cognitive changes including changes both acutely as well as residual changes in expressive language and word finding capacity, changes in memory and learning.  The patient has had long-term issues with peripheral neuropathy as well as previous TIAs and small strokes.  Patient reports that while her symptoms that have developed suddenly after small stroke events have improved there are residual changes related to memory and expressive language functioning.  Patient has had consideration for MS reviewed with no indication of demyelinating type of lesions noted on multiple MRIs and other studies to rule out potential of MS.  Patient does have previous episodes of B12 deficiency potentially creating some neuropathy and diabetic polyneuropathy and indications of microvascular ischemic changes and white matter regions of her brain along with residual of small stroke effects identified in neuroimaging.  Disposition/Plan:  We have set the patient up for formal neuropsychological testing/assessment and she will start with a foundational battery of the  Wechsler Adult Intelligence Scale's and the Wechsler Memory Scale's as well as the controlled oral Word Association test.  We will use this not only to aid in diagnostic considerations and treatment planning but also establish baseline measures utilizing very standardized objective tools.  Once this is completed a formal report will be produced and made available to her neurologist as well as available in her EMR and feedback will be provided in person to the patient.  Diagnosis:    Mild cognitive disorder  Cerebrovascular accident (CVA) due to embolism of cerebral artery ([place])  Hypertension associated with type 2 diabetes mellitus ([place])         Electronically Signed   _______________________ Ilean Skill, Psy.D. Clinical Neuropsychologist

## 2022-11-18 ENCOUNTER — Ambulatory Visit (INDEPENDENT_AMBULATORY_CARE_PROVIDER_SITE_OTHER): Payer: Medicare Other | Admitting: Gastroenterology

## 2022-11-18 ENCOUNTER — Encounter: Payer: Self-pay | Admitting: Gastroenterology

## 2022-11-18 VITALS — BP 132/80 | HR 70 | Ht 63.0 in | Wt 200.2 lb

## 2022-11-18 DIAGNOSIS — R151 Fecal smearing: Secondary | ICD-10-CM | POA: Diagnosis not present

## 2022-11-18 NOTE — Progress Notes (Signed)
HPI : [name] [name] is a very pleasant 72 year old female with a history of renal cell carcinoma and DVT on Xarelto who presents to clinic today to discuss frequent fecal soiling.  This has been a problem for the past several months.  She was seen by me in November 2022 to discuss a colonoscopy.  At that time her only GI complaint was occasional constipation. Her colonoscopy in Dec 2022 was notable only for a small tubular adenoma and a lipoma.  She states that she often notices small amounts of stool in her underwear.  She denies any large volume incontinence.  She denies feeling the stool come out.  She also notes that it has been very difficult to get clean with bowel movements, frequently having to wipe many times and use wet wipes to get fully clean. She had problems with constipation a few months ago, and took laxatives which resolved her constipation.  Since then, she has been having a bowel movement daily, but her stools are typically small in volume and poorly formed. Rarely does she have a large formed stool.  She often has to strain to get stool out even though her stool is very soft.  She denies abdominal pain, watery diarrhea or blood in the stool. No urinary incontinence. She denies symptoms consistent prolapsing hemorrhoids (lumps, bumps or protrusions) and no hemorrhoids were noted on her colonoscopy in Dec 2022. She admits that she does not get much fiber in her diet.  She used to take Metamucil in the past for constipation, but has never tried taking it daily. She uses artificial sweeteners on a daily basis.  Colonoscopy Sep 05, 2021 4 mm tubular adenoma Medium sized lipoma Recommended repeat 7 years   Past Medical History:  Diagnosis Date   Anemia    Arthritis    "knees, hips, possibly back" (04/08/2016)   Atrial septal aneurysm    B12 deficiency    a. following gastric bypass.   Back pain    Cancer of kidney (HCC)    Cataract    Chronic lower back pain     spondylosis   Cold feet    Daily headache    "short ones for the last month or 2" (04/08/2016)   Depression    Diabetes (HCC)    Dry mouth    DVT (deep venous thrombosis) (HCC)    Easy bruising    Floaters in visual field    GERD (gastroesophageal reflux disease)    takes Protonix daily "just to protect my stomach; not for reflux" (04/08/2016)   Hay fever    Heart murmur    "dx'd by 1 anesthesiologist years and years ago"   History of loop recorder 03/2016   Hyperlipidemia    Hypertension    Joint pain    Leg cramp    Macular degeneration    "just watching"   Neuropathy    Nosebleed    Palpitations    Peripheral edema    Peripheral neuropathy    not on any meds   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 s/p endoscopic surgery 02/2016.   Pneumonia 12/2017   Sleep apnea    no CPAP since weight loss after bariatric surgery '12 (04/08/2016)   Spondylosis    Stroke ([place]) 04/07/2016   a. 01/2016 and 03/2016. during admission had + LE DVT.   Swelling of extremity    TIA (transient ischemic attack)    Trouble in sleeping    Urinary incontinence  Weakness      Past Surgical History:  Procedure Laterality Date   ABDOMINOPLASTY     BRACHIOPLASTY     and more tissue removed during a second surgery   CATARACT EXTRACTION W/ INTRAOCULAR LENS  IMPLANT, BILATERAL Bilateral ~ 2012   COLONOSCOPY     EP IMPLANTABLE DEVICE N/A 04/10/2016   Procedure: Loop Recorder Insertion;  Surgeon: Will Meredith Leeds, MD;  Location: [facility];  Service: Cardiovascular;  Laterality: N/A;   ESOPHAGOGASTRODUODENOSCOPY     in the 70's   FACIAL COSMETIC SURGERY     JOINT REPLACEMENT     MASTOPEXY     NEPHRECTOMY  03/2021   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   SINUS ENDO W/FUSION  02/28/2016   Procedure: ENDOSCOPIC SINUS SURGERY WITH NAVIGATION;  Surgeon: Ruby Cola, MD;  Location: [facility];  Service: ENT;;   TEE WITHOUT CARDIOVERSION N/A 04/10/2016   Procedure: TRANSESOPHAGEAL ECHOCARDIOGRAM (TEE)  POSSIBLE LOOP;  Surgeon: Sanda Klein, MD;  Location: [facility];  Service: Cardiovascular;  Laterality: N/A;   [place] Right 2008   TOTAL HIP REVISION Right 03/30/2016   Procedure: RIGHT TOTAL HIP REVISION;  Surgeon: Frederik Pear, MD;  Location: [facility];  Service: Orthopedics;  Laterality: Right;   TOTAL KNEE ARTHROPLASTY Bilateral 2001   VAGINAL HYSTERECTOMY     "left my ovaries"   Family History  Problem Relation Age of Onset   Neuropathy Mother    Hypertension Mother    Hyperlipidemia Mother    Thyroid disease Mother    Prostate cancer Father    Lymphoma Father    Hypertension Father    Hyperlipidemia Father    Cancer Father    Obesity Father    Brain cancer Father    Congestive Heart Failure Maternal Grandmother    Lung cancer Maternal Grandfather    Stomach cancer Neg Hx    Colon cancer Neg Hx    Esophageal cancer Neg Hx    Pancreatic cancer Neg Hx    Social History   Tobacco Use   Smoking status: Former    Packs/day: 1.00    Years: 40.00    Total pack years: 40.00    Types: Cigarettes    Quit date: 08/2006    Years since quitting: 16.2   Smokeless tobacco: Never  Vaping Use   Vaping Use: Never used  Substance Use Topics   Alcohol use: Yes    Alcohol/week: 2.0 standard drinks of alcohol    Types: 2 Glasses of wine per week    Comment: 07/06/2019 maybe 2 glasses of wine/week; 04/08/2016 "5-6 drinks/year'   Drug use: No    Types: Marijuana    Comment: 04/08/2016 "did some marijuana in college"   Current Outpatient Medications  Medication Sig Dispense Refill   alprazolam (XANAX) 2 MG tablet Take 1 tablet (2 mg total) by mouth 3 (three) times daily as needed for sleep. 90 tablet 1   buPROPion (WELLBUTRIN SR) 200 MG 12 hr tablet Take 1 tablet (200 mg total) by mouth 2 (two) times daily. 180 tablet 0   capsicum (ZOSTRIX) 0.075 % topical cream Apply 1 application topically 3 (three) times daily. 5 g 3   gabapentin (NEURONTIN)  300 MG capsule Take 2 capsules (600 mg total) by mouth 3 (three) times daily. 540 capsule 3   lidocaine-prilocaine (EMLA) cream Apply 1 Application topically as needed. 30 g 11   losartan-hydrochlorothiazide (HYZAAR) 100-25 MG tablet Take 1 tablet  by mouth daily. 90 tablet 0   magnesium oxide (MAG-OX) 400 MG tablet Take 400 mg by mouth at bedtime.      Multiple Vitamins-Minerals (OCUVITE-LUTEIN PO) Take 1 tablet by mouth daily.     NONFORMULARY OR COMPOUNDED ITEM Compound Cream 7A (allow alternate if needed for insurance): Amantadine 8%, Baclofen 2%, Gabapentin 8%, Amitriptyline 4%, Bupivacaine 2%, Clonidine 0.2%, Nifedipine 2%.   Instructions: Apply 1-2 grams to the affected area 3-4 times daily. 1 each 5   rosuvastatin (CRESTOR) 20 MG tablet Take 20 mg by mouth at bedtime.     XARELTO 20 MG TABS tablet TAKE 1 TABLET DAILY WITH   SUPPER 90 tablet 4   Semaglutide, 2 MG/DOSE, 8 MG/3ML SOPN Inject 2 mg as directed once a week. 3 mL 0   Current Facility-Administered Medications  Medication Dose Route Frequency Provider Last Rate Last Admin   botulinum toxin Type A (BOTOX) injection 100 Units  100 Units Intramuscular Once Melvenia Beam, MD       botulinum toxin Type A (BOTOX) injection 200 Units  200 Units Intramuscular Once Melvenia Beam, MD       Allergies  Allergen Reactions   Ambien [Zolpidem] Other (See Comments)    NIGHT TERRORS   Atorvastatin Other (See Comments)    Muscle weakness Muscle Aches   Codeine Nausea And Vomiting   Penicillins Itching and Other (See Comments)    Made nose run uncontrollably Has patient had a PCN reaction causing immediate rash, facial/tongue/throat swelling, SOB or lightheadedness with hypotension: No Has patient had a PCN reaction causing severe rash involving mucus membranes or skin necrosis: No Has patient had a PCN reaction that required hospitalization No Has patient had a PCN reaction occurring within the last 10 years: No If all of the above  answers are "NO", then may proceed with Cephalosporin use.   Simvastatin Other (See Comments)    Muscle weakness   Meclizine     Felt like her heart was pounding, "made me crazy", didn't help the dizziness   Meclizine Hcl     Other reaction(s): jittery, heart racing   Other Other (See Comments)   Byetta 10 Mcg Pen [Exenatide] Nausea Only     Review of Systems: All systems reviewed and negative except where noted in HPI.    MR CERVICAL SPINE WO CONTRAST  Result Date: 11/12/2022 Table formatting from the original result was not included. [name] NEURO[facility] REPORT STUDY DATE: 11/12/22 PATIENT NAME: ELAINEY MATOTT DOB: Jan 27, 1951 MRN: [id number] ORDERING CLINICIAN: Melvenia Beam, MD CLINICAL HISTORY: 73 y.o. year old female with: 1. Numbness and tingling of hand  2. Cervical radiculopathy  3. Cervical myelopathy with cervical radiculopathy (HCC)  4. Bilateral numbness and tingling of arms and legs  5. Bilateral arm weakness  6. Bilateral leg weakness   EXAM: MR CERVICAL SPINE WO CONTRAST TECHNIQUE: MRI of the cervical spine was obtained utilizing multiplanar, multiecho pulse sequences. CONTRAST: Diagnostic Product Medications (last 72 hours)   None   COMPARISON: none IMAGING SITE: [facility] AT [place] [id number] FINDINGS: On sagittal views the vertebral bodies have normal height and alignment.  The spinal cord is normal in size and appearance. The posterior fossa, pituitary gland and paraspinal soft tissues are unremarkable.  On axial views: C2-3 no spinal or foraminal narrowing C3-4 uncovertebral joint hypertrophy with moderate left foraminal stenosis C4-5 disc bulging with mild spinal stenosis and severe  right and moderate left foraminal stenosis C5-6 disc bulging and uncovertebral joint hypertrophy with moderate spinal stenosis and severe bilateral foraminal stenosis; no cord signal changes C6-7  disc bulging with moderate left foraminal stenosis C7-T1 no spinal stenosis or foraminal narrowing T1-2 no spinal stenosis or foraminal narrowing Limited views of the soft tissues of the head and neck are unremarkable.   MRI cervical spine without contrast demonstrating: - At C5-6 disc bulging and uncovertebral joint hypertrophy with moderate spinal stenosis and severe bilateral foraminal stenosis; no cord signal changes. - At C4-5 disc bulging with mild spinal stenosis and severe right and moderate left foraminal stenosis. - At C3-4 uncovertebral joint hypertrophy with moderate left foraminal stenosis. - At C6-7 disc bulging with moderate left foraminal stenosis. INTERPRETING PHYSICIAN: Penni Bombard, MD Certified in Neurology, Neurophysiology and Neuroimaging [facility] [address], [place], [place] [address] [phone]  NCV with EMG(electromyography)  Result Date: 10/29/2022 Melvenia Beam, MD     11/03/2022  8:59 PM   Full Name: Ariely Nostrand Gender: Female MRN #: [id number] Date of Birth: December 14, 1950   Visit Date: 10/29/2022 12:21 Age: 25 Years Examining Physician: Dr. Sarina Ill Referring Physician: Dr. Sarina Ill Height: 5 feet 3 inch History: patient with arm weakness bilaterally and paresthesias in her entire body. She has a hx of CTS on the right and wears a brace. No etiology for her 4-limb weakness and paresthesias found. Summary: The left median motor nerve was within normal limits.  The right median motor nerve showed delayed distal onset latency (4.7 ms, normal less than 4.4).  The left ulnar ADM motor nerve was within normal limits.  The right ulnar ADM motor nerve was with within normal limits.  The right radial sensory nerve was within normal limits. the left median orthodromic sensory nerve was within normal limits.  The right median orthodromic sensory nerve showed decreased amplitude (3 V, normal greater than 10).  The left ulnar orthodromic sensory  nerve was within normal limits.  The right ulnar orthodromic sensory nerve was within normal limits. The right median/ulnar (palm) comparison nerve showed prolonged distal peak latency (Median Palm, 3.0 ms, N<2.2) and abnormal peak latency difference (Median Palm-Ulnar Palm, 1.0 ms, N<0.4) with a relative median delay.  All remaining nerves (as indicated in the following tables) were within normal limits.  The right cervical paraspinal muscles showed polyphasic motor units and diminished motor unit recruitment.All remaining muscles (as indicated in the following tables) were within normal limits.    Conclusion: There is moderately severe right Carpal Tunnel Syndrome. Chronic denervation of the cervical paraspinals could be indicative of cervical radiculopathy or underlying myelopathy given patient's widespread paresthesias and reported 4-limb weakness. Recommend MRI Cervical Spine. ------------------------------- Sarina Ill, M.D. [facility] [address] [place], [place] [address] Tel: [phone] Fax: [phone] Verbal informed consent was obtained from the patient, patient was informed of potential risk of procedure, including bruising, bleeding, hematoma formation, infection, muscle weakness, muscle pain, numbness, among others.     [place]   Nerve / Sites Muscle Latency Ref. Amplitude Ref. Rel Amp Segments Distance Velocity Ref. Area   ms ms mV mV %  cm m/s m/s mVms L Median - APB    Wrist APB 3.9 ?4.4 10.1 ?4.0 100 Wrist - APB 7   28.0    Upper arm APB 8.3  7.5  74.5 Upper arm - Wrist 23 52 ?49 23.1 R Median - APB    Wrist APB  4.7 ?4.4 5.6 ?4.0 100 Wrist - APB 7   13.2    Upper arm APB 9.4  8.9  159 Upper arm - Wrist 24 51 ?49 22.1 L Ulnar - ADM    Wrist ADM 3.0 ?3.3 8.7 ?6.0 100 Wrist - ADM 7   26.6    B.Elbow ADM 4.6  8.6  99.1 B.Elbow - Wrist 8 49 ?49 25.0    A.Elbow ADM 8.3  8.2  94.6 A.Elbow - B.Elbow 17 47 ?49 23.0 R Ulnar - ADM    Wrist ADM 3.1 ?3.3 10.1 ?6.0 100 Wrist - ADM 7    26.7    B.Elbow ADM 5.5  9.4  92.9 B.Elbow - Wrist 12 50 ?49 24.2    A.Elbow ADM 8.2  8.3  88.1 A.Elbow - B.Elbow 14 53 ?49 22.0           SNC   Nerve / Sites Rec. Site Peak Lat Ref.  Amp Ref. Segments Distance Peak Diff Ref.   ms ms V V  cm ms ms R Radial - Anatomical snuff box (Forearm)    Forearm Wrist 2.4 ?2.9 15 ?15 Forearm - Wrist 10   L Median, Ulnar - Transcarpal comparison    Median Palm Wrist 2.4 ?2.2 44 ?35 Median Palm - Wrist 8      Ulnar Palm Wrist 2.1 ?2.2 12 ?12 Ulnar Palm - Wrist 8         Median Palm - Ulnar Palm  0.3 ?0.4 R Median, Ulnar - Transcarpal comparison    Median Palm Wrist 3.0 ?2.2 50 ?35 Median Palm - Wrist 8      Ulnar Palm Wrist 2.0 ?2.2 12 ?12 Ulnar Palm - Wrist 8         Median Palm - Ulnar Palm  1.0 ?0.4 L Median - Orthodromic (Dig II, Mid palm)    Dig II Wrist 3.4 ?3.4 11 ?10 Dig II - Wrist 13   R Median - Orthodromic (Dig II, Mid palm)    Dig II Wrist 2.2 ?3.4 3 ?10 Dig II - Wrist 13   L Ulnar - Orthodromic, (Dig V, Mid palm)    Dig V Wrist 3.0 ?3.1 5 ?5 Dig V - Wrist 11   R Ulnar - Orthodromic, (Dig V, Mid palm)    Dig V Wrist 2.1 ?3.1 7 ?5 Dig V - Wrist 63                   F  Wave   Nerve F Lat Ref.  ms ms L Ulnar - ADM 28.4 ?32.0 R Ulnar - ADM 28.0 ?32.0       EMG Summary Table   Spontaneous MUAP Recruitment Muscle IA Fib PSW Fasc Other Amp Dur. Poly Pattern R. Cervical paraspinals (low) Normal None None None _______ Normal Normal 2+ Reduced R. Deltoid Normal None None None _______ Normal Normal Normal Normal R. Triceps brachii Normal None None None _______ Normal Normal Normal Normal R. First dorsal interosseous Normal None None None _______ Normal Normal Normal Normal R. Opponens pollicis Normal None None None _______ Normal Normal Normal Normal R. Pronator teres Normal None None None _______ Normal Normal Normal Normal R. Extensor indicis proprius Normal None None None _______ Normal Normal Normal Normal     Physical Exam: BP 132/80   Pulse 70   Ht '5\' 3"'$  (1.6 m)    Wt 200 lb 3.2 oz (90.8 kg)   BMI 35.46 kg/m  Constitutional: Pleasant,well-developed, Caucasian female  in no acute distress. HEENT: Normocephalic and atraumatic. Conjunctivae are normal. No scleral icterus. Neurological: Alert and oriented to person place and time. Skin: Skin is warm and dry. No rashes noted. Psychiatric: Normal mood and affect. Behavior is normal.  CBC    Component Value Date/Time   WBC 5.4 06/11/2021 0813   WBC 8.0 06/04/2019 1050   RBC 4.00 06/11/2021 0813   RBC 4.77 06/04/2019 1050   HGB 11.6 06/11/2021 0813   HGB 11.9 04/29/2017 0944   HCT 35.6 06/11/2021 0813   HCT 36.9 04/29/2017 0944   PLT 261 06/11/2021 0813   MCV 89 06/11/2021 0813   MCV 81.5 04/29/2017 0944   MCH 29.0 06/11/2021 0813   MCH 28.5 06/04/2019 1050   MCHC 32.6 06/11/2021 0813   MCHC 32.2 06/04/2019 1050   RDW 13.1 06/11/2021 0813   RDW 14.2 04/29/2017 0944   LYMPHSABS 1.0 06/11/2021 0813   LYMPHSABS 1.7 04/29/2017 0944   MONOABS 0.6 04/29/2017 0944   EOSABS 0.1 06/11/2021 0813   BASOSABS 0.1 06/11/2021 0813   BASOSABS 0.0 04/29/2017 0944    CMP     Component Value Date/Time   NA 130 (L) 04/24/2022 1112   NA 139 04/29/2017 0944   K 4.5 04/24/2022 1112   K 3.9 04/29/2017 0944   CL 93 (L) 04/24/2022 1112   CO2 24 04/24/2022 1112   CO2 29 04/29/2017 0944   GLUCOSE 74 04/24/2022 1112   GLUCOSE 132 (H) 06/04/2019 1050   GLUCOSE 120 04/29/2017 0944   BUN 17 04/24/2022 1112   BUN 15.8 04/29/2017 0944   CREATININE 1.04 (H) 04/24/2022 1112   CREATININE 0.8 04/29/2017 0944   CALCIUM 9.0 04/24/2022 1112   CALCIUM 9.2 04/29/2017 0944   PROT 6.1 06/11/2021 0813   PROT 6.8 04/29/2017 0944   ALBUMIN 4.2 06/11/2021 0813   ALBUMIN 3.4 (L) 04/29/2017 0944   AST 20 06/11/2021 0813   AST 17 04/29/2017 0944   ALT 16 06/11/2021 0813   ALT 14 04/29/2017 0944   ALKPHOS 65 06/11/2021 0813   ALKPHOS 116 04/29/2017 0944   BILITOT 0.3 06/11/2021 0813   BILITOT 0.41 04/29/2017 0944    GFRNONAA 84 06/27/2020 0841   GFRAA 97 06/27/2020 0841     ASSESSMENT AND PLAN: 72 year old female with several months of frequent small-volume fecal soiling and seepage in the setting of poorly formed stools.  She had a colonoscopy a little over a year ago which showed only a single small polyp and a lipoma.  She admits that she does not consume much dietary fiber, and also consumes artificial sweeteners on a regular basis.  These factors are likely contributing to her poor stool bulk and consistency.  She likely has age-related muscle atrophy  of her sphincter complex.  I think her symptoms would likely improve by improving her stool bulk and consistency.  I recommended that she start taking Metamucil on a daily basis.  If she does not notice a difference after a few weeks, she can increase the frequency to 2 or 3 times per day.  I also suggested that she try to limit her consumption of the artificial sweeteners if she continues to have seepage.  I do not think there is any role for any further evaluation at this time.  We did discuss the role of pelvic floor physical therapy for symptoms that are not responding to changes in diet and fiber supplementation.  Fecal soiling - Daily Metamucil -Avoid/limit artificial sweeteners - Increase  dietary fiber  Keneshia Tena E. Candis Schatz, MD [facility]  CC:  [name], PA-C

## 2022-11-18 NOTE — Progress Notes (Addendum)
TeleHealth Visit:  This visit was completed with telemedicine (audio/video) technology. Trinity has verbally consented to this TeleHealth visit. The patient is located at home, the provider is located at home. The participants in this visit include the listed provider and patient. The visit was conducted today via MyChart video.  OBESITY Melrose is here to discuss her progress with her obesity treatment plan along with follow-up of her obesity related diagnoses.    Today's visit was # 66  Starting weight: 258 lbs Starting date: 05/07/2018 Weight reported at last virtual office visit: 176 lbs on 07/13/22 Today's reported weight: 192.9 lbs Total weight loss: 66 Weight change since last visit: +16  BP 124/68  Pulse 64  Nutrition Plan: practicing portion control and making smarter food choices, such as increasing vegetables and decreasing simple carbohydrates.  Current exercise: gym (30 cardio/bike/elliptical and 30-40 minutes weights) 4-5 days per week  Interim History: Has been mostly fasting for the past 4 days to lose weight.  She was up to 202 pounds 5 days ago.  She has gained quite a bit of weight since her last OV October 23-weight was 176 pounds.. She is very unhappy with herself.  She has been totally off plan and eating a lot of carbohydrates. Exercise anxious consistent but she notes more fatigue with exercise. Water intake is good.  Starting personal training this week. Going twice per week.   Her goal weight is 165-169 pounds-29 BMI.  Assessment/Plan:  1. Other depression/emotional eating Biancia has had issues with stress/emotional eating.  Recently this has not been controlled because she has been off the plan.  She does feel that bupropion is beneficial for her mood. Medication(s): Bupropion SR 200 mg twice daily  Plan: Continue and refill Bupropion SR 200 mg twice daily  2. Type 2 Diabetes Mellitus with other specified complication, without long-term  current use of insulin HgbA1c is at goal. Last A1c was 5.2 CBGs: Not checking Episodes of hypoglycemia: no Medication(s):  Previously on Ozempic 2 mg but has been off of this for 8 weeks.  She is unsure if it helps with appetite.  Lab Results  Component Value Date   HGBA1C 5.2 10/23/2021   HGBA1C 4.8 06/11/2021   HGBA1C 4.7 (L) 12/12/2020   Lab Results  Component Value Date   LDLCALC 90 06/11/2021   CREATININE 1.04 (H) 04/24/2022   No results found for: "GFR"  Plan: Refill Ozempic 2 mg SQ weekly Instructions given to titrate up to the 2 mg dose.   3. Morbid Obesity: Current BMI 37  Jemima is currently in the action stage of change. As such, her goal is to continue with weight loss efforts.  She has agreed to the Category 2 plan.  Exercise goals: as is  Behavioral modification strategies: increasing lean protein intake, decreasing simple carbohydrates , meal planning , and planning for success.  Linnie has agreed to follow-up with our clinic in 2 weeks.   No orders of the defined types were placed in this encounter.   Medications Discontinued During This Encounter  Medication Reason   buPROPion (WELLBUTRIN SR) 200 MG 12 hr tablet Reorder     Meds ordered this encounter  Medications   Semaglutide, 2 MG/DOSE, 8 MG/3ML SOPN    Sig: Inject 2 mg as directed once a week.    Dispense:  9 mL    Refill:  0    Order Specific Question:   Supervising Provider    Answer:   Netty Starring  buPROPion (WELLBUTRIN SR) 200 MG 12 hr tablet    Sig: Take 1 tablet (200 mg total) by mouth 2 (two) times daily.    Dispense:  180 tablet    Refill:  0    Order Specific Question:   Supervising Provider    Answer:   Dell Ponto [[id number]]      Objective:   VITALS: Per patient if applicable, see vitals. GENERAL: Alert and in no acute distress. CARDIOPULMONARY: No increased WOB. Speaking in clear sentences.  PSYCH: Pleasant and cooperative. Speech normal rate and rhythm.  Affect is appropriate. Insight and judgement are appropriate. Attention is focused, linear, and appropriate.  NEURO: Oriented as arrived to appointment on time with no prompting.   Attestation Statements:   Reviewed by clinician on day of visit: allergies, medications, problem list, medical history, surgical history, family history, social history, and previous encounter notes.  This was prepared with the assistance of [employer].  Occasional wrong-word or sound-a-like substitutions may have occurred due to the inherent limitations of voice recognition software.

## 2022-11-18 NOTE — Patient Instructions (Signed)
_______________________________________________________  If your blood pressure at your visit was 140/90 or greater, please contact your primary care physician to follow up on this.  _______________________________________________________  If you are age 72 or older, your body mass index should be between 23-30. Your Body mass index is 35.46 kg/m. If this is out of the aforementioned range listed, please consider follow up with your Primary Care Provider.  If you are age 64 or younger, your body mass index should be between 19-25. Your Body mass index is 35.46 kg/m. If this is out of the aformentioned range listed, please consider follow up with your Primary Care Provider.   Start Metamucil capsules daily.  Avoid using Sweeteners  Increase dietary supplement.    The [facility] providers would like to encourage you to use [facility] to communicate with providers for non-urgent requests or questions.  Due to long hold times on the telephone, sending your provider a message by [facility] may be a faster and more efficient way to get a response.  Please allow 48 business hours for a response.  Please remember that this is for non-urgent requests.   It was a pleasure to see you today!  Thank you for trusting me with your gastrointestinal care!    Scott E.Candis Schatz, MD

## 2022-11-19 ENCOUNTER — Encounter (INDEPENDENT_AMBULATORY_CARE_PROVIDER_SITE_OTHER): Payer: Self-pay | Admitting: Family Medicine

## 2022-11-19 ENCOUNTER — Telehealth (INDEPENDENT_AMBULATORY_CARE_PROVIDER_SITE_OTHER): Payer: Medicare Other | Admitting: Family Medicine

## 2022-11-19 VITALS — Ht 63.0 in | Wt 192.0 lb

## 2022-11-19 DIAGNOSIS — Z7985 Long-term (current) use of injectable non-insulin antidiabetic drugs: Secondary | ICD-10-CM

## 2022-11-19 DIAGNOSIS — F3289 Other specified depressive episodes: Secondary | ICD-10-CM | POA: Diagnosis not present

## 2022-11-19 DIAGNOSIS — E1169 Type 2 diabetes mellitus with other specified complication: Secondary | ICD-10-CM | POA: Diagnosis not present

## 2022-11-19 DIAGNOSIS — Z6834 Body mass index (BMI) 34.0-34.9, adult: Secondary | ICD-10-CM

## 2022-11-19 DIAGNOSIS — Z6831 Body mass index (BMI) 31.0-31.9, adult: Secondary | ICD-10-CM

## 2022-11-19 MED ORDER — SEMAGLUTIDE (2 MG/DOSE) 8 MG/3ML ~~LOC~~ SOPN: 2.0000 mg | PEN_INJECTOR | SUBCUTANEOUS | 0 refills | Status: DC

## 2022-11-19 MED ORDER — BUPROPION HCL ER (SR) 200 MG PO TB12: 200.0000 mg | ORAL_TABLET | Freq: Two times a day (BID) | ORAL | 0 refills | Status: DC

## 2022-11-21 ENCOUNTER — Other Ambulatory Visit: Payer: Self-pay

## 2022-11-21 ENCOUNTER — Emergency Department (HOSPITAL_BASED_OUTPATIENT_CLINIC_OR_DEPARTMENT_OTHER)
Admission: EM | Admit: 2022-11-21 | Discharge: 2022-11-21 | Disposition: A | Discharge status: Home / Self Care | Payer: Medicare Other | Attending: Emergency Medicine | Admitting: Emergency Medicine

## 2022-11-21 ENCOUNTER — Encounter (HOSPITAL_BASED_OUTPATIENT_CLINIC_OR_DEPARTMENT_OTHER): Payer: Self-pay | Admitting: Emergency Medicine

## 2022-11-21 DIAGNOSIS — Z23 Encounter for immunization: Secondary | ICD-10-CM | POA: Diagnosis not present

## 2022-11-21 DIAGNOSIS — S61021A Laceration with foreign body of right thumb without damage to nail, initial encounter: Secondary | ICD-10-CM | POA: Insufficient documentation

## 2022-11-21 DIAGNOSIS — S61022A Laceration with foreign body of left thumb without damage to nail, initial encounter: Secondary | ICD-10-CM

## 2022-11-21 DIAGNOSIS — W274XXA Contact with kitchen utensil, initial encounter: Secondary | ICD-10-CM | POA: Diagnosis not present

## 2022-11-21 DIAGNOSIS — Z7901 Long term (current) use of anticoagulants: Secondary | ICD-10-CM | POA: Diagnosis not present

## 2022-11-21 DIAGNOSIS — S61012A Laceration without foreign body of left thumb without damage to nail, initial encounter: Secondary | ICD-10-CM | POA: Diagnosis not present

## 2022-11-21 DIAGNOSIS — S6991XA Unspecified injury of right wrist, hand and finger(s), initial encounter: Secondary | ICD-10-CM | POA: Diagnosis present

## 2022-11-21 MED ORDER — TETANUS-DIPHTH-ACELL PERTUSSIS 5-2.5-18.5 LF-MCG/0.5 IM SUSY
0.5000 mL | PREFILLED_SYRINGE | Freq: Once | INTRAMUSCULAR | Status: AC
  Administered 2022-11-21: 0.5 mL via INTRAMUSCULAR
  Filled 2022-11-21: qty 0.5

## 2022-11-21 NOTE — ED Provider Notes (Addendum)
[facility] Provider Note   CSN: [id number] Arrival date & time: 11/21/22  1715     History  No chief complaint on file.   [name] [name] is a 72 y.o. female.  Pt complains of cutting the tip of her finger off using a mandolin.  Pt reports she is on a blood thinner and cut her finger off.    The history is provided by the patient. No language interpreter was used.  Hand Pain This is a new problem. The current episode started 1 to 2 hours ago. The problem occurs constantly. The problem has not changed since onset.Nothing aggravates the symptoms. Nothing relieves the symptoms. She has tried nothing for the symptoms.       Home Medications Prior to Admission medications   Medication Sig Start Date End Date Taking? Authorizing Provider  alprazolam Duanne Moron) 2 MG tablet Take 1 tablet (2 mg total) by mouth 3 (three) times daily as needed for sleep. 09/29/22   Melvenia Beam, MD  buPROPion (WELLBUTRIN SR) 200 MG 12 hr tablet Take 1 tablet (200 mg total) by mouth 2 (two) times daily. 11/19/22   Whitmire, Joneen Boers, FNP  capsicum (ZOSTRIX) 0.075 % topical cream Apply 1 application topically 3 (three) times daily. 05/12/21   Melvenia Beam, MD  gabapentin (NEURONTIN) 300 MG capsule Take 2 capsules (600 mg total) by mouth 3 (three) times daily. 09/29/22   Melvenia Beam, MD  lidocaine-prilocaine (EMLA) cream Apply 1 Application topically as needed. 04/07/22   Melvenia Beam, MD  losartan-hydrochlorothiazide (HYZAAR) 100-25 MG tablet Take 1 tablet by mouth daily. 03/18/22   Whitmire, Joneen Boers, FNP  magnesium oxide (MAG-OX) 400 MG tablet Take 400 mg by mouth at bedtime.     [provider]  Multiple Vitamins-Minerals (OCUVITE-LUTEIN PO) Take 1 tablet by mouth daily.    [provider]  NONFORMULARY OR COMPOUNDED ITEM Compound Cream 7A (allow alternate if needed for insurance): Amantadine 8%, Baclofen 2%, Gabapentin 8%, Amitriptyline 4%,  Bupivacaine 2%, Clonidine 0.2%, Nifedipine 2%.   Instructions: Apply 1-2 grams to the affected area 3-4 times daily. 02/23/22   Melvenia Beam, MD  rosuvastatin (CRESTOR) 20 MG tablet Take 20 mg by mouth at bedtime. 07/03/21   [provider]  Semaglutide, 2 MG/DOSE, 8 MG/3ML SOPN Inject 2 mg as directed once a week. 11/19/22   Whitmire, Joneen Boers, FNP  XARELTO 20 MG TABS tablet TAKE 1 TABLET DAILY WITH   SUPPER 09/29/22   Melvenia Beam, MD      Allergies    Ambien [zolpidem], Atorvastatin, Codeine, Penicillins, Simvastatin, Meclizine, Meclizine hcl, Other, and Byetta 10 mcg pen [exenatide]    Review of Systems   Review of Systems  All other systems reviewed and are negative.   Physical Exam Updated Vital Signs BP (!) 163/72 (BP Location: Right Arm)   Pulse 71   Temp 98.4 F (36.9 C) (Oral)   Resp 20   SpO2 100%  Physical Exam Vitals and nursing note reviewed.  Constitutional:      General: She is not in acute distress.    Appearance: She is well-developed.  HENT:     Head: Normocephalic and atraumatic.  Eyes:     Conjunctiva/sclera: Conjunctivae normal.  Cardiovascular:     Rate and Rhythm: Normal rate.     Heart sounds: No murmur heard. Pulmonary:     Effort: Pulmonary effort is normal. No respiratory distress.  Breath sounds: Normal breath sounds.  Musculoskeletal:        General: Deformity present. No swelling.     Comments: Avulsed distal tip of right thumb, bleeding nv and ns intact  Skin:    General: Skin is warm and dry.     Capillary Refill: Capillary refill takes less than 2 seconds.  Neurological:     Mental Status: She is alert.  Psychiatric:        Mood and Affect: Mood normal.     ED Results / Procedures / Treatments   Labs (all labs ordered are listed, but only abnormal results are displayed) Labs Reviewed - No data to display  EKG None  Radiology No results found.  Procedures .Marland KitchenLaceration Repair  Date/Time: 11/21/2022 10:20  PM  Performed by: Fransico Meadow, PA-C Authorized by: Fransico Meadow, PA-C   Consent:    Consent obtained:  Verbal   Consent given by:  Patient Universal protocol:    Patient identity confirmed:  Verbally with patient Laceration details:    Length (cm):  0.7   Depth (mm):  0.2 Pre-procedure details:    Preparation:  Patient was prepped and draped in usual sterile fashion Treatment:    Area cleansed with:  Povidone-iodine   Irrigation solution:  Sterile saline   Debridement:  None Comments:     Quick clot to wound, pressure dressing applied.  I reexamined 30 minutes after quickclot,  minimal bleeding.  Quick clot reapplied.  Pt observed.  No further bleeding.  Pt advised to leave dressing in place.  Recheck with primary in 2 days      Medications Ordered in ED Medications  Tdap (BOOSTRIX) injection 0.5 mL (0.5 mLs Intramuscular Given 11/21/22 1952)    ED Course/ Medical Decision Making/ A&P                             Medical Decision Making Pt complains of   Risk Prescription drug management.           Final Clinical Impression(s) / ED Diagnoses Final diagnoses:  Laceration of left thumb with foreign body without damage to nail, initial encounter    Rx / DC Orders ED Discharge Orders     None      An After Visit Summary was printed and given to the patient.    Fransico Meadow, PA-C 11/21/22 2223    Fransico Meadow, PA-C 11/21/22 2224    Wyvonnia Dusky, MD 11/21/22 [id number]

## 2022-11-21 NOTE — ED Notes (Signed)
Pt states she thinks her thumb is still bleeding, she is applying pressure over a dressing. Laceration not observed as did not want to interfere with clotting process, will let MD assess first.

## 2022-11-21 NOTE — ED Triage Notes (Signed)
Right thumb injured ,cut tip of finger off with mandolin.

## 2022-11-21 NOTE — Discharge Instructions (Addendum)
Leave dressing in place for the next 2 days.  See your Physician for recheck. Do not take xarelto today.

## 2022-11-21 NOTE — ED Triage Notes (Signed)
Pt is on xarelto

## 2022-11-24 ENCOUNTER — Telehealth: Payer: Self-pay

## 2022-11-24 DIAGNOSIS — E785 Hyperlipidemia, unspecified: Secondary | ICD-10-CM | POA: Diagnosis not present

## 2022-11-24 DIAGNOSIS — E1169 Type 2 diabetes mellitus with other specified complication: Secondary | ICD-10-CM | POA: Diagnosis not present

## 2022-11-24 DIAGNOSIS — M5412 Radiculopathy, cervical region: Secondary | ICD-10-CM | POA: Diagnosis not present

## 2022-11-24 DIAGNOSIS — C641 Malignant neoplasm of right kidney, except renal pelvis: Secondary | ICD-10-CM | POA: Diagnosis not present

## 2022-11-24 DIAGNOSIS — D689 Coagulation defect, unspecified: Secondary | ICD-10-CM | POA: Diagnosis not present

## 2022-11-24 DIAGNOSIS — G959 Disease of spinal cord, unspecified: Secondary | ICD-10-CM | POA: Diagnosis not present

## 2022-11-24 DIAGNOSIS — S61011D Laceration without foreign body of right thumb without damage to nail, subsequent encounter: Secondary | ICD-10-CM | POA: Diagnosis not present

## 2022-11-24 DIAGNOSIS — Z9229 Personal history of other drug therapy: Secondary | ICD-10-CM | POA: Diagnosis not present

## 2022-11-24 DIAGNOSIS — N1831 Chronic kidney disease, stage 3a: Secondary | ICD-10-CM | POA: Diagnosis not present

## 2022-11-24 NOTE — Telephone Encounter (Signed)
        Patient  visited [facility] on 11/21/2022  for Laceration of left thumb with foreign body without damage to nail.   Telephone encounter attempt :  1st  A HIPAA compliant voice message was left requesting a return call.  Instructed patient to call back at [phone].   [place] Resource Care Guide   ??[email or web address]  ?? [id number]   Website: [email or web address]  [email or web address]

## 2022-11-25 ENCOUNTER — Telehealth: Payer: Self-pay

## 2022-11-25 NOTE — Telephone Encounter (Signed)
        Patient  visited [facility] on  11/21/2022  for Laceration of left thumb with foreign body without damage to nail.   Telephone encounter attempt :  2nd  A HIPAA compliant voice message was left requesting a return call.  Instructed patient to call back at [phone].   [place] Resource Care Guide   ??[email or web address]  ?? [id number]   Website: [email or web address]  [email or web address]

## 2022-11-26 ENCOUNTER — Telehealth: Payer: Self-pay

## 2022-11-26 NOTE — Telephone Encounter (Signed)
        Patient  visited [facility] on 11/21/2022  for Laceration of left thumb with foreign body without damage to nail.   Telephone encounter attempt :  3rd  A HIPAA compliant voice message was left requesting a return call.  Instructed patient to call back at [phone].   [place] Resource Care Guide   ??[email or web address]  ?? [email or web address]   Website: [email or web address]  [email or web address]

## 2022-12-01 DIAGNOSIS — E785 Hyperlipidemia, unspecified: Secondary | ICD-10-CM | POA: Diagnosis not present

## 2022-12-01 DIAGNOSIS — Z9229 Personal history of other drug therapy: Secondary | ICD-10-CM | POA: Diagnosis not present

## 2022-12-01 DIAGNOSIS — S61011D Laceration without foreign body of right thumb without damage to nail, subsequent encounter: Secondary | ICD-10-CM | POA: Diagnosis not present

## 2022-12-01 DIAGNOSIS — E1169 Type 2 diabetes mellitus with other specified complication: Secondary | ICD-10-CM | POA: Diagnosis not present

## 2022-12-02 NOTE — Progress Notes (Signed)
TeleHealth Visit:  This visit was completed with telemedicine (audio/video) technology. [name] [name] has verbally consented to this TeleHealth visit. The patient is located at home, the provider is located at home. The participants in this visit include the listed provider and patient. The visit was conducted today via MyChart video.  OBESITY [name] [name] is here to discuss her progress with her obesity treatment plan along with follow-up of her obesity related diagnoses.    Today's visit was # 73  Starting weight: 258 lbs Starting date: 05/07/2018 Weight reported at last virtual office visit: 192.9 lbs on 11/19/22 Today's reported weight: 198.7 lbs  Total weight loss: 60 Weight change since last visit: +6   Nutrition Plan: practicing portion control and making smarter food choices, such as increasing vegetables and decreasing simple carbohydrates.   Current exercise: gym (30 cardio/bike/elliptical and 30-40 minutes weights) 4-5 days per week  Interim History: She has gained 22 pounds since October 2023.  Reports a great deal of emotional eating.  She is quite distressed over her weight gain.  Denies excessive hunger. Has only been on plan 2 days over the past 2 weeks.  She is not even on plan at meals. . She used to be consistent with eating meals. She snacks on carbs throughout the day. Exercise is consistent because she has friends at the gym.  Eating all of the prescribed protein: no Skipping meals: Yes Drinking adequate water: Yes Drinking sugar sweetened beverages: No Hunger controlled: moderately controlled. Cravings controlled:  poorly controlled.  Pharmacotherapy: [name] [name] is on Ozempic 2 mg SQ weekly.   Adverse side effects: None Hunger is moderately controlled.  Has never noted a lot of help from Ozempic with appetite and cravings.  Will be escalating dose of Ozempic to 1 mg next week. Assessment/Plan:  1. Other depression/emotional eating [name] [name] has had issues with  stress/emotional eating. Her mother lives next door and is a source of stress. Currently this is poorly controlled. Overall mood is stable. Medication(s): Bupropion SR 200 mg twice daily  Plan: Continue and refill Bupropion SR 200 mg twice daily Refer to psychology.  She may go ahead and call and make an appointment with [name] [name] before the referral.  2. Hypertension associated with type 2 diabetes Hypertension reasonably well controlled and borderline controlled blood pressures trended up with recent weight gain.  Blood pressure was 147/72 and pulse was 66 at home this morning. Medication(s): losartan/HCTZ 100-25 mg daily.  BP Readings from Last 3 Encounters:  11/21/22 (!) 163/72  11/18/22 132/80  10/29/22 (!) 165/89   Lab Results  Component Value Date   CREATININE 1.04 (H) 04/24/2022   CREATININE 0.94 06/11/2021   CREATININE 0.67 12/12/2020   No results found for: "GFR"  Plan: Continue all antihypertensives at current dosages. Continue to monitor.  May need medication adjustment if she does not lose weight.  3. Morbid Obesity: Current BMI 34 Pharmacotherapy Plan Start Zepbound 7.5 mg SQ weekly.  Her drug coverage is through [employer] plan so hopefully she will be able to use coupon for Zepbound.  1. She will strive to have 2 meals per day on plan. 2.  Keep junk food out of the house. 3.  Shop on a full stomach.  [name] [name] is currently in the action stage of change. As such, her goal is to continue with weight loss efforts.  She has agreed to the Category 2 plan.  Exercise goals:  as is  Behavioral modification strategies: increasing lean protein intake, decreasing  simple carbohydrates , no meal skipping, better snacking choices, emotional eating strategies, decrease junk food, and get rid of junk food in the home.  [name] [name] has agreed to follow-up with our clinic in 2 weeks.   Orders Placed This Encounter  Procedures   Ambulatory referral to  Psychology    There are no discontinued medications.   Meds ordered this encounter  Medications   tirzepatide (ZEPBOUND) 7.5 MG/0.5ML Pen    Sig: Inject 7.5 mg into the skin once a week.    Dispense:  2 mL    Refill:  0    Order Specific Question:   Supervising Provider    Answer:   Glennis Brink [[id number]]   escitalopram (LEXAPRO) 5 MG tablet    Sig: Take 1 tablet (5 mg total) by mouth daily.    Dispense:  30 tablet    Refill:  0    Order Specific Question:   Supervising Provider    Answer:   Glennis Brink [[id number]]      Objective:   VITALS: Per patient if applicable, see vitals. GENERAL: Alert and in no acute distress. CARDIOPULMONARY: No increased WOB. Speaking in clear sentences.  PSYCH: Pleasant and cooperative. Speech normal rate and rhythm. Affect is appropriate. Insight and judgement are appropriate. Attention is focused, linear, and appropriate.  NEURO: Oriented as arrived to appointment on time with no prompting.   Attestation Statements:   Reviewed by clinician on day of visit: allergies, medications, problem list, medical history, surgical history, family history, social history, and previous encounter notes.   This was prepared with the assistance of [employer].  Occasional wrong-word or sound-a-like substitutions may have occurred due to the inherent limitations of voice recognition software.

## 2022-12-03 ENCOUNTER — Telehealth (INDEPENDENT_AMBULATORY_CARE_PROVIDER_SITE_OTHER): Payer: Medicare Other | Admitting: Family Medicine

## 2022-12-03 ENCOUNTER — Encounter (INDEPENDENT_AMBULATORY_CARE_PROVIDER_SITE_OTHER): Payer: Self-pay | Admitting: Family Medicine

## 2022-12-03 ENCOUNTER — Other Ambulatory Visit (INDEPENDENT_AMBULATORY_CARE_PROVIDER_SITE_OTHER): Payer: Self-pay | Admitting: Family Medicine

## 2022-12-03 VITALS — Ht 63.0 in | Wt 198.0 lb

## 2022-12-03 DIAGNOSIS — Z6834 Body mass index (BMI) 34.0-34.9, adult: Secondary | ICD-10-CM | POA: Diagnosis not present

## 2022-12-03 DIAGNOSIS — F3289 Other specified depressive episodes: Secondary | ICD-10-CM

## 2022-12-03 DIAGNOSIS — E1159 Type 2 diabetes mellitus with other circulatory complications: Secondary | ICD-10-CM | POA: Diagnosis not present

## 2022-12-03 DIAGNOSIS — E1169 Type 2 diabetes mellitus with other specified complication: Secondary | ICD-10-CM

## 2022-12-03 DIAGNOSIS — I152 Hypertension secondary to endocrine disorders: Secondary | ICD-10-CM

## 2022-12-03 DIAGNOSIS — Z7985 Long-term (current) use of injectable non-insulin antidiabetic drugs: Secondary | ICD-10-CM | POA: Diagnosis not present

## 2022-12-03 DIAGNOSIS — M4802 Spinal stenosis, cervical region: Secondary | ICD-10-CM | POA: Diagnosis not present

## 2022-12-03 DIAGNOSIS — D352 Benign neoplasm of pituitary gland: Secondary | ICD-10-CM | POA: Diagnosis not present

## 2022-12-03 DIAGNOSIS — Z6835 Body mass index (BMI) 35.0-35.9, adult: Secondary | ICD-10-CM

## 2022-12-03 MED ORDER — ZEPBOUND 7.5 MG/0.5ML ~~LOC~~ SOAJ: 7.5000 mg | SUBCUTANEOUS | 0 refills | Status: DC

## 2022-12-03 MED ORDER — ESCITALOPRAM OXALATE 5 MG PO TABS: 5.0000 mg | ORAL_TABLET | Freq: Every day | ORAL | 0 refills | Status: DC

## 2022-12-03 MED ORDER — TIRZEPATIDE 7.5 MG/0.5ML ~~LOC~~ SOAJ: 7.5000 mg | SUBCUTANEOUS | 0 refills | Status: DC

## 2022-12-09 ENCOUNTER — Telehealth: Payer: Self-pay | Admitting: Neurology

## 2022-12-09 ENCOUNTER — Encounter: Payer: Medicare Other | Attending: Psychology

## 2022-12-09 DIAGNOSIS — F09 Unspecified mental disorder due to known physiological condition: Secondary | ICD-10-CM | POA: Insufficient documentation

## 2022-12-09 DIAGNOSIS — I6340 Cerebral infarction due to embolism of unspecified cerebral artery: Secondary | ICD-10-CM | POA: Diagnosis not present

## 2022-12-09 NOTE — Progress Notes (Signed)
Behavioral Observations:  The patient appeared well-groomed and appropriately dressed. Her manners were polite and appropriate to the situation. The patient's attitude towards testing was positive and her effort was good.   Neuropsychology Note  [name] [name] completed 105 minutes of neuropsychological testing with technician, Wandra Scot, BA, under the supervision of Ilean Skill, PsyD., Clinical Neuropsychologist. The patient did not appear overtly distressed by the testing session, per behavioral observation or via self-report to the technician. Rest breaks were offered.   Clinical Decision Making: In considering the patient's current level of functioning, level of presumed impairment, nature of symptoms, emotional and behavioral responses during clinical interview, level of literacy, and observed level of motivation/effort, a battery of tests was selected by Dr. Sima Matas during initial consultation on 11/17/2022. This was communicated to the technician. Communication between the neuropsychologist and technician was ongoing throughout the testing session and changes were made as deemed necessary based on patient performance on testing, technician observations and additional pertinent factors such as those listed above.  Tests Administered: Controlled Oral Word Association Test (COWAT; FAS & Animals)  Wechsler Adult Intelligence Scale, 4th Edition (WAIS-IV) Wechsler Memory Scale, 4th Edition (WMS-IV); Older Adult Battery   Results:  COWAT: FAS total=42 Z= 0.56 Animals total= 26 Z= 2.48  WAIS-IV:  Composite Score Summary  Scale Sum of Scaled Scores Composite Score Percentile Rank 95% Conf. Interval Qualitative Description  Verbal Comprehension 37 VCI 112 79 106-117 High Average  Perceptual Reasoning 22 PRI 84 14 79-91 Low Average  Working Memory 22 WMI 105 63 98-111 Average  Processing Speed 24 PSI 111 77 102-118 High Average  Full Scale 105 FSIQ 103 58 99-107  Average  General Ability 59 GAI 99 47 94-104 Average   Verbal Comprehension Subtests Summary  Subtest Raw Score Scaled Score Percentile Rank Reference Group Scaled Score SEM  Similarities 27 12 75 11 0.95  Vocabulary 43 12 75 12 0.67  Information 21 13 84 15 0.73   Perceptual Reasoning Subtests Summary  Subtest Raw Score Scaled Score Percentile Rank Reference Group Scaled Score SEM  Block Design 24 8 25 6  0.99  Matrix Reasoning 7 7 16 3  [id number]  Visual Puzzles 7 7 16 5  0.99   Working Doctor, general practice Raw Score Scaled Score Percentile Rank Reference Group Scaled Score SEM  Digit Span 29 12 75 10 0.73  Arithmetic 13 10 50 9 1.20   Processing Speed Subtests Summary  Subtest Raw Score Scaled Score Percentile Rank Reference Group Scaled Score SEM  Symbol Search 31 13 84 9 1.12  Coding 53 11 63 7 1.12   WMS-IV Index Score Summary  Index Sum of Scaled Scores Index Score Percentile Rank 95% Confidence Interval Qualitative Descriptor  Auditory Memory (AMI) 55 123 94 116-128 Superior  Visual Memory (VMI) 10 73 4 69-79 Borderline  Immediate Memory (IMI) 29 97 42 91-103 Average  Delayed Memory (DMI) 36 112 79 103-119 High Average   Primary Subtest Scaled Score Summary  Subtest Domain Raw Score Scaled Score Percentile Rank  Logical Memory I AM 37 12 75  Logical Memory II AM 30 15 95  Verbal [employer] Associates I AM 25 12 75  Verbal [employer] II AM 10 16 98  Visual Reproduction I VM 19 5 5   Visual Reproduction II VM 5 5 5   Symbol Span VWM 19 11 63   Auditory Memory Process Score Summary  Process Score Raw Score Scaled Score Percentile Rank Cumulative Percentage (Base Rate)  LM II Recognition 21 - - >75%  VPA II Recognition 30 - - >75%  VPA II Word Recall 19 17 99 -   Visual Memory Process Score Summary  Process Score Raw Score Scaled Score Percentile Rank Cumulative Percentage (Base Rate)  VR II Recognition 3 - - 17-25%  VR II Copy 42 - - 51-75%     ABILITY-MEMORY ANALYSIS  Ability Score:  VCI: 112 Date of Testing:  WAIS-IV; WMS-IV 2022/12/09  Predicted Difference Method   Index Predicted WMS-IV Index Score Actual WMS-IV Index Score Difference Critical Value  Significant Difference Y/N Base Rate  Auditory Memory 106 123 -17 10.41 Y 10%  Visual Memory 105 73 32 7.35 Y 1%  Immediate Memory 107 97 10 9.69 Y 20-25%  Delayed Memory 106 112 -6 12.22 N   Statistical significance (critical value) at the .01 level.     Feedback to Patient: [name] [name] will return on 08/24/2023 for an interactive feedback session with Dr. Sima Matas at which time her test performances, clinical impressions and treatment recommendations will be reviewed in detail. The patient understands she can contact our office should she require our assistance before this time.  105 minutes spent face-to-face with patient administering standardized tests, 30 minutes spent scoring Environmental education officer). [CPT [id number], [id number]  Full report to follow.

## 2022-12-09 NOTE — Telephone Encounter (Signed)
Pt called. Stated she wants to make an appointment to get Botox shots in her foot.

## 2022-12-10 DIAGNOSIS — S61011A Laceration without foreign body of right thumb without damage to nail, initial encounter: Secondary | ICD-10-CM | POA: Diagnosis not present

## 2022-12-14 ENCOUNTER — Encounter: Payer: Self-pay | Admitting: Neurology

## 2022-12-14 NOTE — Telephone Encounter (Signed)
Please see the MyChart message reply(ies) for my assessment and plan.    This patient gave consent for this Medical Advice Message and is aware that it may result in a bill to [employer], as well as the possibility of receiving a bill for a co-payment or deductible. They are an established patient, but are not seeking medical advice exclusively about a problem treated during an in person or video visit in the last seven days. I did not recommend an in person or video visit within seven days of my reply.    I spent a total of 10 minutes this encounter only through [employer] regarding a new treatment for her diabetc polyneuropathy called qutenza.   Melvenia Beam, MD

## 2022-12-16 DIAGNOSIS — I350 Nonrheumatic aortic (valve) stenosis: Secondary | ICD-10-CM | POA: Diagnosis not present

## 2022-12-16 NOTE — Progress Notes (Unsigned)
TeleHealth Visit:  This visit was completed with telemedicine (audio/video) technology. [name] [name] has verbally consented to this TeleHealth visit. The patient is located at home, the provider is located at home. The participants in this visit include the listed provider and patient. The visit was conducted today via MyChart video.  OBESITY [name] [name] is here to discuss her progress with her obesity treatment plan along with follow-up of her obesity related diagnoses.   Today's visit was # 63  Starting weight: 258 lbs Starting date: 05/07/2018 Weight reported at last virtual office visit: 198.7 lbs on 12/03/22 Today's reported weight (12/17/22):  197.2 lbs Total weight loss: 61 Weight change since last visit: -1 BP: 119/72, pulse 65  Nutrition Plan: practicing portion control and making smarter food choices, such as increasing vegetables and decreasing simple carbohydrates   Current exercise:  gym (30 cardio/bike/elliptical and 30-40 minutes weights) 4-5 days per week   Interim History:  Lost 5 lbs but regained some weight. She  has eaten 2 meals per day half the time which is an improvement.  She is struggling with carbohydrate/sweets cravings, protein intake, and skipping meals. Struggles with stress eating a great deal. Has stress from elderly mom living next door.   She is working on increasing protein intake, not skipping meals, and making healthier choices  Pharmacotherapy: [name] [name] is on Ozempic 2 mg SQ weekly.Taking 1 mg.  Titrating up to 2 mg. Adverse side effects: None Hunger is moderately controlled.  Assessment/Plan:  1. Hypertension Hypertension reasonably well controlled.  Blood pressure reported today is 119/72.  Has had a few elevated readings at appointments in the last 2 months. Medication(s): losartan/HCTZ 100/25 daily  BP Readings from Last 3 Encounters:  11/21/22 (!) 163/72  11/18/22 132/80  10/29/22 (!) 165/89   Lab Results  Component Value Date    CREATININE 1.04 (H) 04/24/2022   CREATININE 0.94 06/11/2021   CREATININE 0.67 12/12/2020   No results found for: "GFR"  Plan: Continue all antihypertensives at current dosages.  2. Other depression/emotional eating [name] [name] has had issues with stress/emotional eating. Has an initial counseling appointment next week with Jan Fireman, [facility].. Currently this is poorly controlled. Overall mood  - she feels "down". Medication(s): Bupropion SR 200 mg twice daily. Lexapro started last OV but she stopped due to SE (insomnia and constipation).  Plan: Continue Bupropion SR 200 mg twice daily Discontinue Lexapro.  Consider adding Zoloft if needed.  3. Type 2 Diabetes Mellitus with other specified complication, without long-term current use of insulin HgbA1c is at goal. Last A1c was 5.2 Medication(s): Ozempic 2 mg SQ weekly  Lab Results  Component Value Date   HGBA1C 5.2 10/23/2021   HGBA1C 4.8 06/11/2021   HGBA1C 4.7 (L) 12/12/2020   Lab Results  Component Value Date   LDLCALC 90 06/11/2021   CREATININE 1.04 (H) 04/24/2022   No results found for: "GFR"  Plan: Increase Ozempic to 2 mg.   4. Morbid Obesity: Current BMI 34 Pharmacotherapy Plan Continue and increase dose Ozempic 2 mg SQ weekly.  [name] [name] is currently in the action stage of change. As such, her goal is to continue with weight loss efforts.  She has agreed to practicing portion control and making smarter food choices, such as increasing vegetables and decreasing simple carbohydrates.  Exercise goals:  as is  Behavioral modification strategies: increasing lean protein intake, decreasing simple carbohydrates , no meal skipping, and decrease junk food.  [name] [name] has agreed to follow-up with our clinic in 4 weeks.  No orders of the defined types were placed in this encounter.   Medications Discontinued During This Encounter  Medication Reason   escitalopram (LEXAPRO) 5 MG tablet Side effect (s)     No  orders of the defined types were placed in this encounter.     Objective:   VITALS: Per patient if applicable, see vitals. GENERAL: Alert and in no acute distress. CARDIOPULMONARY: No increased WOB. Speaking in clear sentences.  PSYCH: Pleasant and cooperative. Speech normal rate and rhythm. Affect is appropriate. Insight and judgement are appropriate. Attention is focused, linear, and appropriate.  NEURO: Oriented as arrived to appointment on time with no prompting.   Attestation Statements:   Reviewed by clinician on day of visit: allergies, medications, problem list, medical history, surgical history, family history, social history, and previous encounter notes.   Time spent on visit including the items listed below was 30 minutes.  -preparing to see the patient (e.g., review of tests, history, previous notes) -obtaining and/or reviewing separately obtained history -counseling and educating the patient/family/caregiver -documenting clinical information in the electronic or other health record -ordering medications, tests, or procedures -independently interpreting results and communicating results to the patient/ family/caregiver -referring and communicating with other health care professionals  -care coordination    This was prepared with the assistance of Dragon [employer].  Occasional wrong-word or sound-a-like substitutions may have occurred due to the inherent limitations of voice recognition software.

## 2022-12-17 ENCOUNTER — Telehealth (INDEPENDENT_AMBULATORY_CARE_PROVIDER_SITE_OTHER): Payer: Medicare Other | Admitting: Family Medicine

## 2022-12-17 ENCOUNTER — Encounter (INDEPENDENT_AMBULATORY_CARE_PROVIDER_SITE_OTHER): Payer: Self-pay | Admitting: Family Medicine

## 2022-12-17 VITALS — Ht 63.0 in | Wt 197.0 lb

## 2022-12-17 DIAGNOSIS — E1169 Type 2 diabetes mellitus with other specified complication: Secondary | ICD-10-CM | POA: Diagnosis not present

## 2022-12-17 DIAGNOSIS — Z6834 Body mass index (BMI) 34.0-34.9, adult: Secondary | ICD-10-CM

## 2022-12-17 DIAGNOSIS — F3289 Other specified depressive episodes: Secondary | ICD-10-CM | POA: Diagnosis not present

## 2022-12-17 DIAGNOSIS — I152 Hypertension secondary to endocrine disorders: Secondary | ICD-10-CM | POA: Diagnosis not present

## 2022-12-17 DIAGNOSIS — E1159 Type 2 diabetes mellitus with other circulatory complications: Secondary | ICD-10-CM

## 2022-12-17 DIAGNOSIS — Z7985 Long-term (current) use of injectable non-insulin antidiabetic drugs: Secondary | ICD-10-CM | POA: Diagnosis not present

## 2022-12-22 ENCOUNTER — Ambulatory Visit (INDEPENDENT_AMBULATORY_CARE_PROVIDER_SITE_OTHER): Payer: Medicare Other | Admitting: Psychology

## 2022-12-22 DIAGNOSIS — F331 Major depressive disorder, recurrent, moderate: Secondary | ICD-10-CM | POA: Diagnosis not present

## 2022-12-22 NOTE — Progress Notes (Signed)
? ? ? ? ? ? ? ? ? ? ? ? ? ? ?  [name] [name], LCMHC ?

## 2022-12-22 NOTE — Progress Notes (Signed)
[place] Counselor Initial Adult Exam  Name: [name] [name] Date: 12/22/2022 MRN: [id number] DOB: [birth year] PCP: [name], PA-C  Time spent: 9:00am-9:48am  Pt is seen for a virtual video visit via caregility.  Pt joins from her home, reporting privacy, and counselor from her home office.  Guardian/Payee:  self     Paperwork requested: No   Reason for Visit /Presenting Problem: pt is referred by Jake Bathe w/ healthy weight and wellness. Pt reports "I was on track for 3 years and lost a lot of weight and gained 40 lbs back in past year".  Pt reports that she is not happy with this and feels general unhappiness and stress.  Pt reports that she has taken Wellbutrin for 1 year as part of her weightloss plan and helps w/ mood some.  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  Pt states "I resent it."   Pt also has struggled w/ 50 year friendship that has "imploded a couple Novembers back". Pt reports that had been friends for 60 years, they lived in [place] and had consistent contact through text and then he stopped responding.  Pt sought to understand why and resolve.  Pt reported never received a response.  She reports couple weeks ago sent a email attempting again and received a response from friend that she was a horrible person, rude and trite.  Pt states "I don't know where that came from.  Why the break after 50 years."       Mental Status Exam: Appearance:   Well Groomed     Behavior:  Appropriate  Motor:  Restlestness  Speech/Language:   Normal Rate  Affect:  Appropriate  Mood:  depressed  Thought process:  normal  Thought content:    WNL  Sensory/Perceptual disturbances:    WNL  Orientation:  oriented to person, place, time/date, and situation  Attention:  Good  Concentration:  Good   Memory:  WNL  Fund of knowledge:   Good  Insight:    Good  Judgment:   Good  Impulse Control:  Good   Reported Symptoms:  pt reports feeling down/unhappy generally.  Pt reports struggles w/ falling asleep.  Pt reports eats when feels down.  Pt reports feeling unhappy w/ self and struggles to stay on track w/ her plan for weight loss.  Pt reports anxiety w/ worry and ruminating on past friendship and irritability.  Pt reports resentment for having to be caregiver. No SI or HI.    Risk Assessment: Danger to Self:  No Self-injurious Behavior: No Danger to Others: No Duty to Warn:no Physical Aggression / Violence:No  Access to Firearms a concern: No  Gang Involvement:No  Patient / guardian was educated about steps to take if suicide or homicide risk level increases between visits: n/a While future psychiatric events cannot be accurately predicted, the patient does not currently require acute inpatient psychiatric care and does not currently meet [facility] involuntary commitment criteria.  Substance Abuse History: Current substance abuse: No     Past Psychiatric History:   Previous psychological history is significant for depression Outpatient Providers:1994 severely depressed and saw a psychiatrist and had group therapy for awhile.  Pt reports she liked counseling and found helpful.   History of Psych Hospitalization: No  Psychological Testing:  none    Abuse History:  Victim of: No.,  none    Report needed: No. Victim of Neglect:No. Perpetrator of  none   Witness / Exposure to Domestic Violence: No   Protective Services Involvement: No  Witness to [employer] Violence:  No   Family History:  Family History  Problem Relation Age of Onset   Neuropathy Mother    Hypertension Mother    Hyperlipidemia Mother    Thyroid disease Mother    Prostate cancer Father    Lymphoma Father    Hypertension Father    Hyperlipidemia Father    Cancer Father    Obesity Father    Brain  cancer Father    Congestive Heart Failure Maternal Grandmother    Lung cancer Maternal Grandfather    Stomach cancer Neg Hx    Colon cancer Neg Hx    Esophageal cancer Neg Hx    Pancreatic cancer Neg Hx   Pt grew up in [place] w/ her parents.  Pt was only child.  Parents move back to [place] in 73. Pt was Closer to dad growing up.   "Mom always been a little stand offish and remembers her being very hateful to dad when she was younger- he threatened to leave w/ her when she was 72y/o.  They never separated.  Pt reports mom was nicer after menopause.  Pt talked w/ her parents every Sunday and visited about 2 times a year when lived in [place].  Dad passed away in 01/17/2013 from brain tumor- had lymphoma previous and prostate cancer.  Her mom moved next door to her after a fall in 18-Jan-2015 and pt has become her caregiver.  Her mom will be 75 in September- she struggles w/ neuropathy bad, had a recent fall that resulted in compression fracture and her Mobility worse since.  She goes w/her aunt, mom  to church on Sundays and takes her to breakfast once a week.  Her maternal Aunt is 26 y/o, lives close by and is always on the go.    Living situation: the patient lives alone.  She had 2 dogs that died in 01/18/2016 and 18-Jan-2019.  Pt reports considers dog again as companion but doesn't want the expense or planning w/ travel.   Sexual Orientation: Straight  Relationship Status: single- never married.  Pt reported one long distance relationship in the past.   No children.  Support Systems: friends- good friend and enjoys traveling with.  Couple other friends.    Financial Stress:  No   Income/Employment/Disability: Retired since 01/18/11 from [place]. 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    Military Service: No   Educational History: Education:  not reported  Religion/Sprituality/World View: Darrick Meigs attends sunday  Any cultural differences that may affect / interfere with  treatment:  not applicable   Recreation/Hobbies: Pt enjoys travel.  She has a good friend she travels w/. "No big trip this year".  Going to beach for 10 days this week.  Past trips include [place] river cruise to [place], [employer] cruise [place],   [place] and [place] trips.  Pt reports she also enjoys reading.  Stressors: Other: caregiver stress, loss of friendship and difficulty w/ weight loss.    Strengths: Conservator, museum/gallery.  Attends gym- Been consistent 5 days a week.    Barriers:  none   Legal History: Pending legal issue / charges:  none. History of legal issue / charges:  none  Medical History/Surgical History: reviewed Past Medical History:  Diagnosis Date   Anemia    Arthritis    "knees, hips, possibly back" (04/08/2016)   Atrial septal aneurysm    B12 deficiency    a. following gastric bypass.   Back pain    Cancer of kidney (HCC)    Cataract    Chronic lower back pain    spondylosis   Cold feet    Daily headache    "short ones for the last month or 2" (04/08/2016)   Depression    Diabetes (HCC)    Dry mouth    DVT (deep venous thrombosis) (HCC)    Easy bruising    Floaters in visual field    GERD (gastroesophageal reflux disease)    takes Protonix daily "just to protect my stomach; not for reflux" (04/08/2016)   Hay fever    Heart murmur    "dx'd by 1 anesthesiologist years and years ago"   History of loop recorder 03/2016   Hyperlipidemia    Hypertension    Joint pain    Leg cramp    Macular degeneration    "just watching"   Neuropathy    Nosebleed    Palpitations    Peripheral edema    Peripheral neuropathy    not on any meds   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   Pneumonia 12/2017   Sleep apnea    no CPAP since weight loss after bariatric surgery '12 (04/08/2016)   Spondylosis    Stroke ([place]) 04/07/2016   a. 01/2016 and 03/2016. during admission had + LE DVT.   Swelling of extremity    TIA (transient ischemic attack)    Trouble  in sleeping    Urinary incontinence    Weakness     Past Surgical History:  Procedure Laterality Date   ABDOMINOPLASTY     BRACHIOPLASTY     and more tissue removed during a second surgery   CATARACT EXTRACTION W/ INTRAOCULAR LENS  IMPLANT, BILATERAL Bilateral ~ 2012   COLONOSCOPY     EP IMPLANTABLE DEVICE N/A 04/10/2016   Procedure: Loop Recorder Insertion;  Surgeon: Will Meredith Leeds, MD;  Location: [facility];  Service: Cardiovascular;  Laterality: N/A;   ESOPHAGOGASTRODUODENOSCOPY     in the 70's   FACIAL COSMETIC SURGERY     JOINT REPLACEMENT     MASTOPEXY     NEPHRECTOMY  03/2021   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   SINUS ENDO W/FUSION  02/28/2016   Procedure: ENDOSCOPIC SINUS SURGERY WITH NAVIGATION;  Surgeon: Ruby Cola, MD;  Location: [facility];  Service: ENT;;   TEE WITHOUT CARDIOVERSION N/A 04/10/2016   Procedure: TRANSESOPHAGEAL ECHOCARDIOGRAM (TEE) POSSIBLE LOOP;  Surgeon: Sanda Klein, MD;  Location: [facility];  Service: Cardiovascular;  Laterality: N/A;   [place] Right 2008   TOTAL HIP REVISION Right 03/30/2016   Procedure: RIGHT TOTAL HIP REVISION;  Surgeon: Frederik Pear, MD;  Location: [facility];  Service: Orthopedics;  Laterality: Right;   TOTAL KNEE ARTHROPLASTY Bilateral 2001   VAGINAL HYSTERECTOMY     "left my ovaries"    Medications: Current Outpatient Medications  Medication Sig Dispense Refill   alprazolam (XANAX) 2 MG tablet Take 1 tablet (2 mg total) by mouth 3 (three) times daily as needed for sleep. 90 tablet 1   buPROPion (WELLBUTRIN SR) 200 MG 12 hr tablet Take 1 tablet (200 mg total) by  mouth 2 (two) times daily. 180 tablet 0   capsicum (ZOSTRIX) 0.075 % topical cream Apply 1 application topically 3 (three) times daily. 5 g 3   gabapentin (NEURONTIN) 300 MG capsule Take 2 capsules (600 mg total) by mouth 3 (three) times daily. 540 capsule 3   lidocaine-prilocaine (EMLA) cream Apply 1 Application  topically as needed. 30 g 11   losartan-hydrochlorothiazide (HYZAAR) 100-25 MG tablet Take 1 tablet by mouth daily. 90 tablet 0   magnesium oxide (MAG-OX) 400 MG tablet Take 400 mg by mouth at bedtime.      Multiple Vitamins-Minerals (OCUVITE-LUTEIN PO) Take 1 tablet by mouth daily.     NONFORMULARY OR COMPOUNDED ITEM Compound Cream 7A (allow alternate if needed for insurance): Amantadine 8%, Baclofen 2%, Gabapentin 8%, Amitriptyline 4%, Bupivacaine 2%, Clonidine 0.2%, Nifedipine 2%.   Instructions: Apply 1-2 grams to the affected area 3-4 times daily. 1 each 5   rosuvastatin (CRESTOR) 20 MG tablet Take 20 mg by mouth at bedtime.     Semaglutide, 2 MG/DOSE, 8 MG/3ML SOPN Inject 2 mg as directed once a week. 9 mL 0   tirzepatide (MOUNJARO) 7.5 MG/0.5ML Pen Inject 7.5 mg into the skin once a week. 2 mL 0   XARELTO 20 MG TABS tablet TAKE 1 TABLET DAILY WITH   SUPPER 90 tablet 4   Current Facility-Administered Medications  Medication Dose Route Frequency Provider Last Rate Last Admin   botulinum toxin Type A (BOTOX) injection 100 Units  100 Units Intramuscular Once Melvenia Beam, MD       botulinum toxin Type A (BOTOX) injection 200 Units  200 Units Intramuscular Once Melvenia Beam, MD        Allergies  Allergen Reactions   Ambien [Zolpidem] Other (See Comments)    NIGHT TERRORS   Atorvastatin Other (See Comments)    Muscle weakness Muscle Aches   Codeine Nausea And Vomiting   Penicillins Itching and Other (See Comments)    Made nose run uncontrollably Has patient had a PCN reaction causing immediate rash, facial/tongue/throat swelling, SOB or lightheadedness with hypotension: No Has patient had a PCN reaction causing severe rash involving mucus membranes or skin necrosis: No Has patient had a PCN reaction that required hospitalization No Has patient had a PCN reaction occurring within the last 10 years: No If all of the above answers are "NO", then may proceed with Cephalosporin  use.   Simvastatin Other (See Comments)    Muscle weakness   Meclizine     Felt like her heart was pounding, "made me crazy", didn't help the dizziness   Meclizine Hcl     Other reaction(s): jittery, heart racing   Other Other (See Comments)   Byetta 10 Mcg Pen [Exenatide] Nausea Only    Diagnoses:  Major depressive disorder, recurrent episode, moderate  Plan of Care: pt is a 72y/o female seeking counseling for current stressors and feeling unhappy.  Pt reports hx of depressive episode in 1994 and received tx w/ improvement. Pt reports current feeling down, unhappy w/ self and not following eating plan.  Pt reports significant stressors of being caregiver to 45 year old mother, loss of friendship of 50+ years and struggle to maintain weight loss this past year.  Pt is taking wellbutrin for her weight loss plan and reports does help keep self more even- less irritable. Pt reports struggles w/ sleep regularly and does have pain from neurpathy.  Pt to f/u w/ counseling at least biweekly and w/ her  PCP and healthy weight and wellness.    Individualized Treatment Plan Strengths: enjoys travel, enjoys reading, attends gym 4-5 times a week.  Supports: friends.  Travel buddy.   Goal/Needs for Treatment:  In order of importance to patient 1) cope w/ unhappy feelings w/out eating 2) cope w/ stressors 3) --   Client Statement of Needs: "get past these blockages.  If I can make headway on my stressors and Get a handle on this weight I would feel happy.   the more unhappy I am the more I eat.  I want A healhty balance."    Treatment Level: outpatient counseling .    Symptoms:unhappy, stressed, overwhelmed, emotional eating  Client Treatment Preferences:every 1-2 weeks cousneling.  Continue w/ healthy weight and wellness and f/u w/ PCP.   Healthcare consumer's goal for treatment:  Counselor, Jan Fireman, [facility] will support the patient's ability to achieve the goals identified. Cognitive  Behavioral Therapy, Assertive Communication/Conflict Resolution Training, Relaxation Training, ACT, Humanistic and other evidenced-based practices will be used to promote progress towards healthy functioning.   Healthcare consumer will: Actively participate in therapy, working towards healthy functioning.    *Justification for Continuation/Discontinuation of Goal: R=Revised, O=Ongoing, A=Achieved, D=Discontinued  Goal 1) Decrease depressed moods and emotional eating AEB pt report.  Baseline date 12/22/22: Progress towards goal 0; How Often - Daily Target Date Goal Was reviewed Status Code Progress towards goal/Likert rating  12/22/23                Goal 2) Increased effective coping w/ stressors AEB pt report of daily self care, utilizing coping skills and reduced anxiety/depression.   Baseline date 12/22/22: Progress towards goal 0; How Often - Daily Target Date Goal Was reviewed Status Code Progress towards goal  12/22/23                 This plan has been reviewed and created by the following participants:  This plan will be reviewed at least every 12 months. Date Behavioral Health Clinician Date Guardian/Patient   12/22/22  [facility] 12/22/22 Verbal Consent Provided                      Jan Fireman [facility]

## 2023-01-06 ENCOUNTER — Telehealth (INDEPENDENT_AMBULATORY_CARE_PROVIDER_SITE_OTHER): Payer: Medicare Other | Admitting: Family Medicine

## 2023-01-06 ENCOUNTER — Ambulatory Visit (INDEPENDENT_AMBULATORY_CARE_PROVIDER_SITE_OTHER): Payer: Medicare Other | Admitting: Psychology

## 2023-01-06 DIAGNOSIS — F331 Major depressive disorder, recurrent, moderate: Secondary | ICD-10-CM | POA: Diagnosis not present

## 2023-01-06 NOTE — Progress Notes (Signed)
[facility] Counselor/Therapist Progress Note  Patient ID: [name] [name], MRN: [id number],    Date: 01/06/2023  Time Spent: 9:00am-9:46am   Treatment Type: Individual Therapy  pt is seen for a virtual video visit via caregility.  Pt joins from her home, reporting privacy, and counselor from her home office.  Reported Symptoms: stressed w/ interactions w/ mom, resentment, stress eating  Mental Status Exam: Appearance:  Well Groomed     Behavior: Appropriate  Motor: Normal  Speech/Language:  Normal Rate  Affect: Appropriate  Mood: irritable  Thought process: normal  Thought content:   WNL  Sensory/Perceptual disturbances:   WNL  Orientation: oriented to person, place, time/date, and situation  Attention: Good  Concentration: Good  Memory: WNL  Fund of knowledge:  Good  Insight:   Good  Judgment:  Good  Impulse Control: Good   Risk Assessment: Danger to Self:  No Self-injurious Behavior: No Danger to Others: No Duty to Warn:no Physical Aggression / Violence:No  Access to Firearms a concern: No  Gang Involvement:No   Subjective: Counselor assessed pt current functioning per pt report. Processed w/pt her vacation and positives and transition home to caregiving to mom.  Assisted pt in recognizing impact of these stressors.  Discussed patterns of years of messages from mom as not good enough and how impacts interactions and how she thinks of them now.  Explored ways of naming and reframing and shifting energy to release stress.  Pt affect wnl.  Pt reports she really enjoyed her vacation to [facility].  Pt reports 'paying for now" w/ interactions w mom.  Pt reports passive aggressive comments by mom about how she was cared for when she was gone.  Pt discussed how from young age always hearing from mom that not good enough messages.  Pt is able to see that filters interactions through those thoughts.  Pt discussed way of reframing for self and how to de-stress from those  interactions w/ out turning to food or other unhealthy ways of coping.    Interventions: Cognitive Behavioral Therapy and supportive  Diagnosis:Major depressive disorder, recurrent episode, moderate  Plan: pt to f/u in 1-2 weeks for counseling.  Pt to f/u as scheduled w/ her PCP and healthy weight and wellness.   Individualized Treatment Plan Strengths: enjoys travel, enjoys reading, attends gym 4-5 times a week.  Supports: friends.  Travel buddy.   Goal/Needs for Treatment:  In order of importance to patient 1) cope w/ unhappy feelings w/out eating 2) cope w/ stressors 3) --   Client Statement of Needs: "get past these blockages.  If I can make headway on my stressors and Get a handle on this weight I would feel happy.   the more unhappy I am the more I eat.  I want A healhty balance."    Treatment Level: outpatient counseling .    Symptoms:unhappy, stressed, overwhelmed, emotional eating  Client Treatment Preferences:every 1-2 weeks cousneling.  Continue w/ healthy weight and wellness and f/u w/ PCP.   Healthcare consumer's goal for treatment:  Counselor, Forde Radon, [facility] will support the patient's ability to achieve the goals identified. Cognitive Behavioral Therapy, Assertive Communication/Conflict Resolution Training, Relaxation Training, ACT, Humanistic and other evidenced-based practices will be used to promote progress towards healthy functioning.   Healthcare consumer will: Actively participate in therapy, working towards healthy functioning.    *Justification for Continuation/Discontinuation of Goal: R=Revised, O=Ongoing, A=Achieved, D=Discontinued  Goal 1) Decrease depressed moods and emotional eating AEB pt report.  Baseline date  12/22/22: Progress towards goal 0; How Often - Daily Target Date Goal Was reviewed Status Code Progress towards goal/Likert rating  12/22/23                Goal 2) Increased effective coping w/ stressors AEB pt report of daily self care,  utilizing coping skills and reduced anxiety/depression.   Baseline date 12/22/22: Progress towards goal 0; How Often - Daily Target Date Goal Was reviewed Status Code Progress towards goal  12/22/23                 This plan has been reviewed and created by the following participants:  This plan will be reviewed at least every 12 months. Date Behavioral Health Clinician Date Guardian/Patient   12/22/22  [facility] Ophelia Charter [facility] 12/22/22 Verbal Consent Provided                   Forde Radon [facility]

## 2023-01-07 DIAGNOSIS — Z85528 Personal history of other malignant neoplasm of kidney: Secondary | ICD-10-CM | POA: Diagnosis not present

## 2023-01-07 DIAGNOSIS — Z08 Encounter for follow-up examination after completed treatment for malignant neoplasm: Secondary | ICD-10-CM | POA: Diagnosis not present

## 2023-01-07 DIAGNOSIS — C641 Malignant neoplasm of right kidney, except renal pelvis: Secondary | ICD-10-CM | POA: Diagnosis not present

## 2023-01-07 DIAGNOSIS — Z905 Acquired absence of kidney: Secondary | ICD-10-CM | POA: Diagnosis not present

## 2023-01-07 DIAGNOSIS — Z87891 Personal history of nicotine dependence: Secondary | ICD-10-CM | POA: Diagnosis not present

## 2023-01-11 ENCOUNTER — Ambulatory Visit (INDEPENDENT_AMBULATORY_CARE_PROVIDER_SITE_OTHER): Payer: Medicare Other | Admitting: Neurology

## 2023-01-11 DIAGNOSIS — M62472 Contracture of muscle, left ankle and foot: Secondary | ICD-10-CM

## 2023-01-11 DIAGNOSIS — E1142 Type 2 diabetes mellitus with diabetic polyneuropathy: Secondary | ICD-10-CM

## 2023-01-11 MED ORDER — ONABOTULINUMTOXINA 200 UNITS IJ SOLR: 155.0000 [IU] | Freq: Once | INTRAMUSCULAR | Status: DC

## 2023-01-11 NOTE — Progress Notes (Unsigned)
Botox- 200 units x 1 vial Lot: [id number] Expiration: 02/2025 NDC: 8295-6213-08  Bacteriostatic 0.9% Sodium Chloride- 4 mL total Lot: [id number] Expiration: 07/2024 NDC: 62952-841-32  Dx: G40.102  B/B Witnessed by Delmer Islam

## 2023-01-11 NOTE — Progress Notes (Unsigned)
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 Hx of diabetic(per-diabetic) neuropathy.    Procedure: All procedures documented were medically necessary, reasonable and appropriate based on the patient's history, medical diagnosis and physician opinion. Verbal informed consent was obtained from the patient, patient was informed of potential risk of procedure, including bruising, bleeding, hematoma formation, infection, muscle weakness, muscle pain, numbness, transient hypertension, transient hyperglycemia and transient insomnia among others. All areas injected were topically clean with isopropyl rubbing alcohol. Nonsterile nonlatex gloves were worn during the procedure.    Injections were given unilateral to the left plantar surface of the foot medial surface ask her to point out the area of pain and the extensor digitorum brevis muscle and Tibials posterior. 200 units of Botox were used and none was wasted.   Dx: W09.811  (In the future if we continue: [phone] CPT code and no additional limb today but [phone] for additional limb)

## 2023-01-12 MED ORDER — ONABOTULINUMTOXINA 200 UNITS IJ SOLR
200.0000 [IU] | Freq: Once | INTRAMUSCULAR | Status: AC
  Administered 2023-01-12: 200 [IU] via INTRAMUSCULAR

## 2023-01-12 NOTE — Progress Notes (Unsigned)
TeleHealth Visit:  This visit was completed with telemedicine (audio/video) technology. [name] [name] has verbally consented to this TeleHealth visit. The patient is located at home, the provider is located at home. The participants in this visit include the listed provider and patient. The visit was conducted today via MyChart video.  OBESITY [name] [name] is here to discuss her progress with her obesity treatment plan along with follow-up of her obesity related diagnoses.    Today's visit was # 75  Starting weight: 258 lbs Starting date: 05/07/2018 Weight reported at last virtual office visit: 197.2 lbs on 12/17/22 Today's reported weight (01/13/23):  200 lbs Total weight loss: 58 Weight change since last visit: +3 BP: 147/83  Nutrition Plan: practicing portion control and making smarter food choices, such as increasing vegetables and decreasing simple carbohydrates  Current exercise: gym (30 cardio/bike/elliptical and 30-40 minutes weights) 4-5 days per week  Interim History:  Protein intake is poor but she is doing better with eating breakfast and lunch. She has been eating out quite a bit and snacking on carbohydrates.  She has alcohol when she eats out and estimates that she has alcohol 4-5 days days per week.  She has been struggling for a while now and is very concerned about regaining all of the weight she lost.  Her lowest weight since attending our clinic was around 159 pounds which was almost a 100 pound weight loss. She is struggling with stress eating. She is currently on Ozempic but does not feel it helps much for control of appetite and cravings. She recently picked up [facility] but has not started it yet.  First injection will be this Friday. Assessment/Plan:  1. Type 2 Diabetes Mellitus with other specified complication, without long-term current use of insulin HgbA1c is at goal. Last A1c was 5.2. CBGs: Not checking Episodes of hypoglycemia: no Medication(s): Ozempic 2 mg  SQ weekly She has the Mounjaro 7.5 mg dose at home but has not started it.  Will start it in 2 days.  Lab Results  Component Value Date   HGBA1C 5.2 10/23/2021   HGBA1C 4.8 06/11/2021   HGBA1C 4.7 (L) 12/12/2020   Lab Results  Component Value Date   LDLCALC 90 06/11/2021   CREATININE 1.04 (H) 04/24/2022   No results found for: "GFR"  Plan: Start Mounjaro 7.5 mg SQ weekly.  Take for 4 weeks. Then increase dose to Mounjaro 10 mg weekly. Check A1c.   2. Hyperlipidemia LDL is at goal.  Most recent lipid panel was done with PCP on 07/14/2022: LDL at goal -61, HDL at goal -103, triglycerides at goal -73. On Crestor 20 mg weekly.  Lab Results  Component Value Date   CHOL 197 06/11/2021   HDL 93 06/11/2021   LDLCALC 90 06/11/2021   TRIG 77 06/11/2021   CHOLHDL 2.7 04/09/2016   CHOLHDL 4.1 01/31/2016   CHOLHDL 4.7 03/15/2010   Lab Results  Component Value Date   ALT 16 06/11/2021   AST 20 06/11/2021   ALKPHOS 65 06/11/2021   BILITOT 0.3 06/11/2021   The ASCVD Risk score (Arnett DK, et al., 2019) failed to calculate for the following reasons:   The patient has a prior MI or stroke diagnosis  Plan:  Continue Crestor 20 milligrams weekly. Check lipid profile.  3. Vitamin D Deficiency Vitamin D is at goal of 50.  Most recent vitamin D level was 51.4. Not on supplementation. Lab Results  Component Value Date   VD25OH 51.4 06/11/2021   VD25OH 45.3 12/12/2020  VD25OH 44.6 06/27/2020    Plan: Check vitamin D level.  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  High weight was 295 pounds.  Low weight 213 pounds after surgery. Not taking a multivitamin.  Currently taking calcium and a B supplement.  Plan: Check labs-CBC, CMET, folic acid, prealbumin, B12, ferritin.  5. Morbid Obesity: Current BMI 35 [name] is currently in the action stage of change. As such, her goal is to continue with weight loss efforts.  She has agreed to  practicing portion control and making smarter food choices, such as increasing vegetables and decreasing simple carbohydrates.  She will focus on eating breakfast and lunch on plan every day.  She tends to follow category 2 breakfast and lunch.  Exercise goals:  as is  Behavioral modification strategies: increasing lean protein intake, decreasing simple carbohydrates , decrease eating out, and decrease liquid calories.  [name] has agreed to follow-up with our clinic in 2 weeks.   Orders Placed This Encounter  Procedures   CBC with Differential/Platelet   Comprehensive metabolic panel   Hemoglobin A1c   Lipid Panel With LDL/HDL Ratio   Prealbumin   VITAMIN D 25 Hydroxy (Vit-D Deficiency, Fractures)   Folate   Ferritin   Vitamin B12    Medications Discontinued During This Encounter  Medication Reason   tirzepatide (MOUNJARO) 7.5 MG/0.5ML Pen Dose change   Semaglutide, 2 MG/DOSE, 8 MG/3ML SOPN Change in therapy     Meds ordered this encounter  Medications   tirzepatide (MOUNJARO) 10 MG/0.5ML Pen    Sig: Inject 10 mg into the skin once a week.    Dispense:  2 mL    Refill:  0    Order Specific Question:   Supervising Provider    Answer:   Glennis Brink [[id number]]      Objective:   VITALS: Per patient if applicable, see vitals. GENERAL: Alert and in no acute distress. CARDIOPULMONARY: No increased WOB. Speaking in clear sentences.  PSYCH: Pleasant and cooperative. Speech normal rate and rhythm. Affect is appropriate. Insight and judgement are appropriate. Attention is focused, linear, and appropriate.  NEURO: Oriented as arrived to appointment on time with no prompting.   Attestation Statements:   Reviewed by clinician on day of visit: allergies, medications, problem list, medical history, surgical history, family history, social history, and previous encounter notes.   This was prepared with the assistance of [employer].  Occasional wrong-word or sound-a-like  substitutions may have occurred due to the inherent limitations of voice recognition software.

## 2023-01-13 ENCOUNTER — Telehealth (INDEPENDENT_AMBULATORY_CARE_PROVIDER_SITE_OTHER): Payer: Medicare Other | Admitting: Family Medicine

## 2023-01-13 ENCOUNTER — Encounter (INDEPENDENT_AMBULATORY_CARE_PROVIDER_SITE_OTHER): Payer: Self-pay | Admitting: Family Medicine

## 2023-01-13 VITALS — Ht 63.0 in | Wt 200.0 lb

## 2023-01-13 DIAGNOSIS — Z6835 Body mass index (BMI) 35.0-35.9, adult: Secondary | ICD-10-CM | POA: Diagnosis not present

## 2023-01-13 DIAGNOSIS — Z7985 Long-term (current) use of injectable non-insulin antidiabetic drugs: Secondary | ICD-10-CM

## 2023-01-13 DIAGNOSIS — E1169 Type 2 diabetes mellitus with other specified complication: Secondary | ICD-10-CM | POA: Diagnosis not present

## 2023-01-13 DIAGNOSIS — E559 Vitamin D deficiency, unspecified: Secondary | ICD-10-CM | POA: Diagnosis not present

## 2023-01-13 DIAGNOSIS — E785 Hyperlipidemia, unspecified: Secondary | ICD-10-CM | POA: Diagnosis not present

## 2023-01-13 DIAGNOSIS — K9089 Other intestinal malabsorption: Secondary | ICD-10-CM | POA: Diagnosis not present

## 2023-01-13 MED ORDER — TIRZEPATIDE 10 MG/0.5ML ~~LOC~~ SOAJ: 10.0000 mg | SUBCUTANEOUS | 0 refills | Status: DC

## 2023-01-18 ENCOUNTER — Ambulatory Visit (INDEPENDENT_AMBULATORY_CARE_PROVIDER_SITE_OTHER): Payer: Medicare Other | Admitting: Psychology

## 2023-01-18 DIAGNOSIS — F331 Major depressive disorder, recurrent, moderate: Secondary | ICD-10-CM

## 2023-01-18 NOTE — Progress Notes (Signed)
[facility] Counselor/Therapist Progress Note  Patient ID: SATRINA MAGALLANES, MRN: [id number],    Date: 01/18/2023  Time Spent: 8:00am-8:50am   Treatment Type: Individual Therapy  pt is seen for a virtual video visit via caregility.  Pt joins from her home, reporting privacy, and counselor from her home office.  Reported Symptoms: enjoyed interactions w/ friends, looking forward to things this week, expressed sadness re: loss of friendship  Mental Status Exam: Appearance:  Well Groomed     Behavior: Appropriate  Motor: Normal  Speech/Language:  Normal Rate  Affect: Appropriate  Mood: normal and some sadness  Thought process: normal  Thought content:   WNL  Sensory/Perceptual disturbances:   WNL  Orientation: oriented to person, place, time/date, and situation  Attention: Good  Concentration: Good  Memory: WNL  Fund of knowledge:  Good  Insight:   Good  Judgment:  Good  Impulse Control: Good   Risk Assessment: Danger to Self:  No Self-injurious Behavior: No Danger to Others: No Duty to Warn:no Physical Aggression / Violence:No  Access to Firearms a concern: No  Gang Involvement:No   Subjective: Counselor assessed pt current functioning per pt report. Processed w/pt her recent interactions and activities w/ friends.  Explored w/ pt upcoming plans and ways of engaging w/ positives to assist w/ coping.  Assisted pt w/ recognizing distortions and assisted w/ challenging. Explored conflict resolution.  Pt affect wnl.  Pt reports she had a good trip w/ friend and interactions w/ mom have been good.  Pt reported on things looking forward to this week- engaging w/ others and self care.  Pt reported that she is focusing on encouraging self statements when not following through w/ her eating plan.  Pt discussed loss of friendship and statements told that she was a hateful person and internalizing this must be true.  Pt is able to acknowledged distortion and make some  challenges.  Struggling w/ self doubt in other relationships.     Interventions: Cognitive Behavioral Therapy and supportive  Diagnosis:Major depressive disorder, recurrent episode, moderate (HCC)  Plan: pt to f/u in 1-2 weeks for counseling.  Pt to f/u as scheduled w/ her PCP and healthy weight and wellness.   Individualized Treatment Plan Strengths: enjoys travel, enjoys reading, attends gym 4-5 times a week.  Supports: friends.  Travel buddy.   Goal/Needs for Treatment:  In order of importance to patient 1) cope w/ unhappy feelings w/out eating 2) cope w/ stressors 3) --   Client Statement of Needs: "get past these blockages.  If I can make headway on my stressors and Get a handle on this weight I would feel happy.   the more unhappy I am the more I eat.  I want A healhty balance."    Treatment Level: outpatient counseling .    Symptoms:unhappy, stressed, overwhelmed, emotional eating  Client Treatment Preferences:every 1-2 weeks cousneling.  Continue w/ healthy weight and wellness and f/u w/ PCP.   Healthcare consumer's goal for treatment:  Counselor, Forde Radon, [facility] will support the patient's ability to achieve the goals identified. Cognitive Behavioral Therapy, Assertive Communication/Conflict Resolution Training, Relaxation Training, ACT, Humanistic and other evidenced-based practices will be used to promote progress towards healthy functioning.   Healthcare consumer will: Actively participate in therapy, working towards healthy functioning.    *Justification for Continuation/Discontinuation of Goal: R=Revised, O=Ongoing, A=Achieved, D=Discontinued  Goal 1) Decrease depressed moods and emotional eating AEB pt report.  Baseline date 12/22/22: Progress towards goal 0; How Often -  Daily Target Date Goal Was reviewed Status Code Progress towards goal/Likert rating  12/22/23                Goal 2) Increased effective coping w/ stressors AEB pt report of daily self care,  utilizing coping skills and reduced anxiety/depression.   Baseline date 12/22/22: Progress towards goal 0; How Often - Daily Target Date Goal Was reviewed Status Code Progress towards goal  12/22/23                 This plan has been reviewed and created by the following participants:  This plan will be reviewed at least every 12 months. Date Behavioral Health Clinician Date Guardian/Patient   12/22/22  [facility] Ophelia Charter [facility] 12/22/22 Verbal Consent Provided                          Forde Radon [facility]

## 2023-01-19 ENCOUNTER — Other Ambulatory Visit (INDEPENDENT_AMBULATORY_CARE_PROVIDER_SITE_OTHER): Payer: Self-pay | Admitting: Family Medicine

## 2023-01-19 DIAGNOSIS — G5601 Carpal tunnel syndrome, right upper limb: Secondary | ICD-10-CM | POA: Diagnosis not present

## 2023-01-19 DIAGNOSIS — M654 Radial styloid tenosynovitis [de Quervain]: Secondary | ICD-10-CM | POA: Diagnosis not present

## 2023-01-19 DIAGNOSIS — M25531 Pain in right wrist: Secondary | ICD-10-CM | POA: Diagnosis not present

## 2023-01-19 DIAGNOSIS — E1169 Type 2 diabetes mellitus with other specified complication: Secondary | ICD-10-CM

## 2023-01-21 DIAGNOSIS — K9089 Other intestinal malabsorption: Secondary | ICD-10-CM | POA: Diagnosis not present

## 2023-01-21 DIAGNOSIS — E559 Vitamin D deficiency, unspecified: Secondary | ICD-10-CM | POA: Diagnosis not present

## 2023-01-21 DIAGNOSIS — E1169 Type 2 diabetes mellitus with other specified complication: Secondary | ICD-10-CM | POA: Diagnosis not present

## 2023-01-21 DIAGNOSIS — E785 Hyperlipidemia, unspecified: Secondary | ICD-10-CM | POA: Diagnosis not present

## 2023-01-22 LAB — COMPREHENSIVE METABOLIC PANEL
ALT: 58 IU/L — ABNORMAL HIGH (ref 0–32)
AST: 132 IU/L — ABNORMAL HIGH (ref 0–40)
Albumin/Globulin Ratio: 2.1 (ref 1.2–2.2)
Albumin: 4.1 g/dL (ref 3.8–4.8)
Alkaline Phosphatase: 133 IU/L — ABNORMAL HIGH (ref 44–121)
BUN/Creatinine Ratio: 13 (ref 12–28)
BUN: 16 mg/dL (ref 8–27)
Bilirubin Total: 0.4 mg/dL (ref 0.0–1.2)
CO2: 21 mmol/L (ref 20–29)
Calcium: 8.7 mg/dL (ref 8.7–10.3)
Chloride: 94 mmol/L — ABNORMAL LOW (ref 96–106)
Creatinine, Ser: 1.27 mg/dL — ABNORMAL HIGH (ref 0.57–1.00)
Globulin, Total: 2 g/dL (ref 1.5–4.5)
Glucose: 70 mg/dL (ref 70–99)
Potassium: 5 mmol/L (ref 3.5–5.2)
Sodium: 133 mmol/L — ABNORMAL LOW (ref 134–144)
Total Protein: 6.1 g/dL (ref 6.0–8.5)
eGFR: 45 mL/min/{1.73_m2} — ABNORMAL LOW (ref >59)

## 2023-01-22 LAB — CBC WITH DIFFERENTIAL/PLATELET
Basophils Absolute: 0.1 10*3/uL (ref 0.0–0.2)
Basos: 1 %
EOS (ABSOLUTE): 0.1 10*3/uL (ref 0.0–0.4)
Eos: 3 %
Hematocrit: 31.1 % — ABNORMAL LOW (ref 34.0–46.6)
Hemoglobin: 9.4 g/dL — ABNORMAL LOW (ref 11.1–15.9)
Immature Grans (Abs): 0 10*3/uL (ref 0.0–0.1)
Immature Granulocytes: 0 %
Lymphocytes Absolute: 1 10*3/uL (ref 0.7–3.1)
Lymphs: 17 %
MCH: 23.9 pg — ABNORMAL LOW (ref 26.6–33.0)
MCHC: 30.2 g/dL — ABNORMAL LOW (ref 31.5–35.7)
MCV: 79 fL (ref 79–97)
Monocytes Absolute: 0.6 10*3/uL (ref 0.1–0.9)
Monocytes: 10 %
Neutrophils Absolute: 3.9 10*3/uL (ref 1.4–7.0)
Neutrophils: 69 %
Platelets: 267 10*3/uL (ref 150–450)
RBC: 3.94 x10E6/uL (ref 3.77–5.28)
RDW: 15.4 % (ref 11.7–15.4)
WBC: 5.7 10*3/uL (ref 3.4–10.8)

## 2023-01-22 LAB — HEMOGLOBIN A1C
Est. average glucose Bld gHb Est-mCnc: 108 mg/dL
Hgb A1c MFr Bld: 5.4 % (ref 4.8–5.6)

## 2023-01-22 LAB — LIPID PANEL WITH LDL/HDL RATIO
Cholesterol, Total: 148 mg/dL (ref 100–199)
HDL: 103 mg/dL (ref >39)
LDL Chol Calc (NIH): 34 mg/dL (ref 0–99)
LDL/HDL Ratio: 0.3 ratio (ref 0.0–3.2)
Triglycerides: 51 mg/dL (ref 0–149)
VLDL Cholesterol Cal: 11 mg/dL (ref 5–40)

## 2023-01-22 LAB — VITAMIN D 25 HYDROXY (VIT D DEFICIENCY, FRACTURES): Vit D, 25-Hydroxy: 45.4 ng/mL (ref 30.0–100.0)

## 2023-01-22 LAB — VITAMIN B12: Vitamin B-12: 872 pg/mL (ref 232–1245)

## 2023-01-22 LAB — FERRITIN: Ferritin: 19 ng/mL (ref 15–150)

## 2023-01-22 LAB — FOLATE: Folate: >20 ng/mL (ref >3.0)

## 2023-01-27 ENCOUNTER — Telehealth (INDEPENDENT_AMBULATORY_CARE_PROVIDER_SITE_OTHER): Payer: Medicare Other | Admitting: Family Medicine

## 2023-02-01 ENCOUNTER — Ambulatory Visit (INDEPENDENT_AMBULATORY_CARE_PROVIDER_SITE_OTHER): Payer: Medicare Other | Admitting: Psychology

## 2023-02-01 DIAGNOSIS — F331 Major depressive disorder, recurrent, moderate: Secondary | ICD-10-CM | POA: Diagnosis not present

## 2023-02-01 NOTE — Progress Notes (Signed)
[facility] Counselor/Therapist Progress Note  Patient ID: MALIKIA BERGS, MRN: [id number],    Date: 02/01/2023  Time Spent: 9:00am-9:39am   Treatment Type: Individual Therapy  pt is seen for a virtual video visit via caregility.  Pt joins from her home, reporting privacy, and counselor from her home office.  Reported Symptoms: positive interactions w/ friends, managed well through mom's outburst, on track w/ eating plan   Mental Status Exam: Appearance:  Well Groomed     Behavior: Appropriate  Motor: Normal  Speech/Language:  Normal Rate  Affect: Appropriate  Mood: normal  Thought process: normal  Thought content:   WNL  Sensory/Perceptual disturbances:   WNL  Orientation: oriented to person, place, time/date, and situation  Attention: Good  Concentration: Good  Memory: WNL  Fund of knowledge:  Good  Insight:   Good  Judgment:  Good  Impulse Control: Good   Risk Assessment: Danger to Self:  No Self-injurious Behavior: No Danger to Others: No Duty to Warn:no Physical Aggression / Violence:No  Access to Firearms a concern: No  Gang Involvement:No   Subjective: Counselor assessed pt current functioning per pt report. Processed w/pt her mood, stressors and positives.  Reflected positive coping of mom's emotional escalation and not internalizing.  Explored w/ pt follow through on her eating plan and taking meal by meal.  Discussed her self care and interactions w/ friends that beneficial.  Pt affect wnl.  Pt reports she has been doing well w/ mood.  Pt reports that she feels good about handled mom emotional escalation 2 weeks ago. Pt reported that she was able to not take personal- externalize and get mom back on track.  Pt reported that she has enjoyed time w/ friends and discussed upcoming plans for self care and w/ friends.  Pt reported that she has been on track w/ her eating plan past 4 days and feels good about this.  Pt reminded that able to take meal by  meal.  Interventions: Cognitive Behavioral Therapy and supportive  Diagnosis:Major depressive disorder, recurrent episode, moderate (HCC)  Plan: pt to f/u in 2 weeks for counseling.  Pt to f/u as scheduled w/ her PCP and healthy weight and wellness.   Individualized Treatment Plan Strengths: enjoys travel, enjoys reading, attends gym 4-5 times a week.  Supports: friends.  Travel buddy.   Goal/Needs for Treatment:  In order of importance to patient 1) cope w/ unhappy feelings w/out eating 2) cope w/ stressors 3) --   Client Statement of Needs: "get past these blockages.  If I can make headway on my stressors and Get a handle on this weight I would feel happy.   the more unhappy I am the more I eat.  I want A healhty balance."    Treatment Level: outpatient counseling .    Symptoms:unhappy, stressed, overwhelmed, emotional eating  Client Treatment Preferences:every 1-2 weeks cousneling.  Continue w/ healthy weight and wellness and f/u w/ PCP.   Healthcare consumer's goal for treatment:  Counselor, Forde Radon, [facility] will support the patient's ability to achieve the goals identified. Cognitive Behavioral Therapy, Assertive Communication/Conflict Resolution Training, Relaxation Training, ACT, Humanistic and other evidenced-based practices will be used to promote progress towards healthy functioning.   Healthcare consumer will: Actively participate in therapy, working towards healthy functioning.    *Justification for Continuation/Discontinuation of Goal: R=Revised, O=Ongoing, A=Achieved, D=Discontinued  Goal 1) Decrease depressed moods and emotional eating AEB pt report.  Baseline date 12/22/22: Progress towards goal 0; How Often -  Daily Target Date Goal Was reviewed Status Code Progress towards goal/Likert rating  12/22/23                Goal 2) Increased effective coping w/ stressors AEB pt report of daily self care, utilizing coping skills and reduced anxiety/depression.    Baseline date 12/22/22: Progress towards goal 0; How Often - Daily Target Date Goal Was reviewed Status Code Progress towards goal  12/22/23                 This plan has been reviewed and created by the following participants:  This plan will be reviewed at least every 12 months. Date Behavioral Health Clinician Date Guardian/Patient   12/22/22  [facility] Ophelia Charter [facility] 12/22/22 Verbal Consent Provided                        Forde Radon [facility]

## 2023-02-08 DIAGNOSIS — Z86718 Personal history of other venous thrombosis and embolism: Secondary | ICD-10-CM | POA: Diagnosis not present

## 2023-02-08 DIAGNOSIS — I4729 Other ventricular tachycardia: Secondary | ICD-10-CM | POA: Diagnosis not present

## 2023-02-08 DIAGNOSIS — E785 Hyperlipidemia, unspecified: Secondary | ICD-10-CM | POA: Diagnosis not present

## 2023-02-08 DIAGNOSIS — I350 Nonrheumatic aortic (valve) stenosis: Secondary | ICD-10-CM | POA: Diagnosis not present

## 2023-02-08 DIAGNOSIS — Z8673 Personal history of transient ischemic attack (TIA), and cerebral infarction without residual deficits: Secondary | ICD-10-CM | POA: Diagnosis not present

## 2023-02-08 DIAGNOSIS — I253 Aneurysm of heart: Secondary | ICD-10-CM | POA: Diagnosis not present

## 2023-02-08 DIAGNOSIS — I10 Essential (primary) hypertension: Secondary | ICD-10-CM | POA: Diagnosis not present

## 2023-02-09 ENCOUNTER — Other Ambulatory Visit (INDEPENDENT_AMBULATORY_CARE_PROVIDER_SITE_OTHER): Payer: Self-pay | Admitting: Family Medicine

## 2023-02-09 DIAGNOSIS — E1169 Type 2 diabetes mellitus with other specified complication: Secondary | ICD-10-CM

## 2023-02-09 NOTE — Progress Notes (Unsigned)
TeleHealth Visit:  This visit was completed with telemedicine (audio/video) technology. [name] [name] has verbally consented to this TeleHealth visit. The patient is located at home, the provider is located at home. The participants in this visit include the listed provider and patient. The visit was conducted today via MyChart video.  OBESITY [name] [name] is here to discuss her progress with her obesity treatment plan along with follow-up of her obesity related diagnoses.    Today's visit was # 76  Starting weight: 258 lbs Starting date: 05/07/2018 Weight reported at last virtual office visit: 200 lbs on 01/13/23 Today's reported weight (02/10/23):  194.6 lbs Total weight loss: 62 lbs Weight change since last visit: -6 BP: 126/75 Pulse: 63  Nutrition Plan: the Category 2 plan and practicing portion control and making smarter food choices, such as increasing vegetables and decreasing simple carbohydrates adherence.  Current exercise:  gym (30 cardio/bike/elliptical and 30-40 minutes weights) 4-5 days per week. Personal trainer once weekly.  Interim History:  She is down 6 lbs since last office visit. She is on plan at breakfast and lunch over past 3 weeks about 50% of time. Has followed the plan 100% for 7 days out of last 3 weeks. This is a vast improvement. She does note it is emotional eating vs hunger. She started [facility] a few months ago and feels it may be helping. Her lowest weight since attending our clinic was around 149 pounds (05/23/20) which was almost a 100 pound weight loss.  She regained over 20 pounds since February of this year.  She is consistent with exercise.  Has recently started using a personal trainer and wants to increase sessions.  Assessment/Plan:  We discussed recent lab results in depth.  1. LFT elevation LFTs elevated when checked on 01/21/2023.  She has not had any elevations in the past. She gets periodic CTs of the abdomen and pelvis since she is status  post renal cancer and none of her scans have shown evidence of fatty liver. Denies jaundice or abdominal pain. She has 1-2 drinks of alcohol 4 to 5 days/week.  Lab Results  Component Value Date   ALT 58 (H) 01/21/2023   AST 132 (H) 01/21/2023   ALKPHOS 133 (H) 01/21/2023   BILITOT 0.4 01/21/2023   Plan: Recheck LFTs before next office visit. If still elevated will have her follow-up with GI.  2. Iron deficiency anemia Hemoglobin 9.4 on 01/21/2023.  Ferritin low normal at 19.  Last colonoscopy was July 14, 2023-follow-up colonoscopy in 7 years.  Denies blood in stool. History of gastric bypass 2012. Iron supplementation: Began taking iron 325 mg by mouth daily since lab results came back. Lab Results  Component Value Date   WBC 5.7 01/21/2023   HGB 9.4 (L) 01/21/2023   HCT 31.1 (L) 01/21/2023   MCV 79 01/21/2023   PLT 267 01/21/2023   Lab Results  Component Value Date   IRON 68 06/11/2021   TIBC 299 06/11/2021   FERRITIN 19 01/21/2023     Plan: Continue supplementation at current dose Recheck CBC before next visit. If LFTs and CBC still abnormal, will have her follow-up with GI.   3. Other depression/emotional eating [name] [name] has had issues with stress/emotional eating. Currently this is poorly controlled. Overall mood is stable. She recently began seeing a counselor.  Has stress from dealing with her mother who lives next-door. Medication(s): Bupropion SR 200 mg twice daily She would like to take the bupropion only once daily.  Plan: Discontinue bupropion SR  200 mg twice daily. Start Bupropion XL 300 mg daily Continue counseling.  4. 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 Last A1c was 5.4 CBGs: Not checking Episodes of hypoglycemia: no Medication(s): Mounjaro 7.5 mg SQ weekly.  Tolerating well.  Feels it may be helping with appetite/cravings. Recently finished last dose of Mounjaro 7.5 and has a  box of 10 mg available to start. GFR has been decreased the last few times it has been checked.  GFR was 45 on 01/21/2023. Lab Results  Component Value Date   HGBA1C 5.4 01/21/2023   HGBA1C 5.2 10/23/2021   HGBA1C 4.8 06/11/2021   Lab Results  Component Value Date   LDLCALC 34 01/21/2023   CREATININE 1.27 (H) 01/21/2023   No results found for: "GFR"  Plan: Start Mounjaro 10 mg SQ weekly Prescription sent for Mounjaro 12.5 mg to begin after she finishes the 10 mg box. Recheck GFR.  Consider referral to nephrology if still decreased.  She is status post right nephrectomy for cancer (2022).  5. Morbid Obesity: Current BMI 34  [name] [name] is currently in the action stage of change. As such, her goal is to continue with weight loss efforts.  She has agreed to the Category 2 plan.  1.  Adhere to breakfast and lunch on plan 100% of the time. 2.  Work on having 6 to 8 ounces of meat at dinner.  Exercise goals: as is  Behavioral modification strategies: increasing lean protein intake, decreasing simple carbohydrates , and planning for success.  [name] [name] has agreed to follow-up with our clinic in 2 weeks.   Orders Placed This Encounter  Procedures   CBC with Differential/Platelet   Comprehensive metabolic panel    Medications Discontinued During This Encounter  Medication Reason   tirzepatide (MOUNJARO) 10 MG/0.5ML Pen    buPROPion (WELLBUTRIN SR) 200 MG 12 hr tablet Dose change     Meds ordered this encounter  Medications   tirzepatide (MOUNJARO) 12.5 MG/0.5ML Pen    Sig: Inject 12.5 mg into the skin once a week.    Dispense:  2 mL    Refill:  0    Order Specific Question:   Supervising Provider    Answer:   Glennis Brink [[id number]]   buPROPion (WELLBUTRIN XL) 300 MG 24 hr tablet    Sig: Take 1 tablet (300 mg total) by mouth daily.    Dispense:  90 tablet    Refill:  0    Order Specific Question:   Supervising Provider    Answer:   Glennis Brink [[id number]]   Iron, Ferrous Sulfate,  325 (65 Fe) MG TABS    Sig: Take 325 mg by mouth daily.    Dispense:  30 tablet    Order Specific Question:   Supervising Provider    Answer:   Glennis Brink [[id number]]      Objective:   VITALS: Per patient if applicable, see vitals. GENERAL: Alert and in no acute distress. CARDIOPULMONARY: No increased WOB. Speaking in clear sentences.  PSYCH: Pleasant and cooperative. Speech normal rate and rhythm. Affect is appropriate. Insight and judgement are appropriate. Attention is focused, linear, and appropriate.  NEURO: Oriented as arrived to appointment on time with no prompting.   Attestation Statements:   Reviewed by clinician on day of visit: allergies, medications, problem list, medical history, surgical history, family history, social history, and previous encounter notes.   This was prepared with  the assistance of [employer].  Occasional wrong-word or sound-a-like substitutions may have occurred due to the inherent limitations of voice recognition software.

## 2023-02-10 ENCOUNTER — Telehealth (INDEPENDENT_AMBULATORY_CARE_PROVIDER_SITE_OTHER): Payer: Medicare Other | Admitting: Family Medicine

## 2023-02-10 ENCOUNTER — Encounter (INDEPENDENT_AMBULATORY_CARE_PROVIDER_SITE_OTHER): Payer: Self-pay | Admitting: Family Medicine

## 2023-02-10 VITALS — Ht 63.0 in | Wt 194.0 lb

## 2023-02-10 DIAGNOSIS — Z7985 Long-term (current) use of injectable non-insulin antidiabetic drugs: Secondary | ICD-10-CM | POA: Diagnosis not present

## 2023-02-10 DIAGNOSIS — E1122 Type 2 diabetes mellitus with diabetic chronic kidney disease: Secondary | ICD-10-CM | POA: Diagnosis not present

## 2023-02-10 DIAGNOSIS — R7989 Other specified abnormal findings of blood chemistry: Secondary | ICD-10-CM | POA: Diagnosis not present

## 2023-02-10 DIAGNOSIS — F3289 Other specified depressive episodes: Secondary | ICD-10-CM | POA: Diagnosis not present

## 2023-02-10 DIAGNOSIS — Z6834 Body mass index (BMI) 34.0-34.9, adult: Secondary | ICD-10-CM | POA: Diagnosis not present

## 2023-02-10 DIAGNOSIS — D509 Iron deficiency anemia, unspecified: Secondary | ICD-10-CM | POA: Diagnosis not present

## 2023-02-10 DIAGNOSIS — N1831 Chronic kidney disease, stage 3a: Secondary | ICD-10-CM | POA: Diagnosis not present

## 2023-02-10 MED ORDER — IRON (FERROUS SULFATE) 325 (65 FE) MG PO TABS
325.0000 mg | ORAL_TABLET | Freq: Every day | ORAL | Status: DC
Start: 2023-02-10 — End: 2024-03-10

## 2023-02-10 MED ORDER — TIRZEPATIDE 12.5 MG/0.5ML ~~LOC~~ SOAJ
12.5000 mg | SUBCUTANEOUS | 0 refills | Status: DC
Start: 2023-02-10 — End: 2023-03-16

## 2023-02-10 MED ORDER — BUPROPION HCL ER (XL) 300 MG PO TB24
300.0000 mg | ORAL_TABLET | Freq: Every day | ORAL | 0 refills | Status: AC
Start: 2023-02-10 — End: ?

## 2023-02-17 ENCOUNTER — Ambulatory Visit (INDEPENDENT_AMBULATORY_CARE_PROVIDER_SITE_OTHER): Payer: Medicare Other | Admitting: Psychology

## 2023-02-17 DIAGNOSIS — F331 Major depressive disorder, recurrent, moderate: Secondary | ICD-10-CM

## 2023-02-17 NOTE — Progress Notes (Signed)
[facility] Counselor/Therapist Progress Note  Patient ID: [name] [name], MRN: [id number],    Date: 02/17/2023  Time Spent: 9:01am-9:41am   Treatment Type: Individual Therapy  pt is seen for a virtual video visit via caregility.  Pt joins from her home, reporting privacy, and counselor from her home office.  Reported Symptoms: improving w/ eating plan,  some days struggling w/ snacking, mood good.    Mental Status Exam: Appearance:  Well Groomed     Behavior: Appropriate  Motor: Normal  Speech/Language:  Normal Rate  Affect: Appropriate  Mood: normal  Thought process: normal  Thought content:   WNL  Sensory/Perceptual disturbances:   WNL  Orientation: oriented to person, place, time/date, and situation  Attention: Good  Concentration: Good  Memory: WNL  Fund of knowledge:  Good  Insight:   Good  Judgment:  Good  Impulse Control: Good   Risk Assessment: Danger to Self:  No Self-injurious Behavior: No Danger to Others: No Duty to Warn:no Physical Aggression / Violence:No  Access to Firearms a concern: No  Gang Involvement:No   Subjective: Counselor assessed pt current functioning per pt report. Processed w/pt her positives and stressors.  Reflected progress making and assisted pt in reframing perfection expectations.  Discussed encouraging self talk and behavior progress chart.  Pt affect wnl.  Pt reports she has been doing well w/ mood.  Pt reports that she is having more good days w/ her progress towards her eating plan goals.  Pt still negative self talk re: snacking and acknowledges expectations for perfection and is able to reframe.  Pt discussed some ways of tracking and changing behaviors associated w/ snacking.    Interventions: Cognitive Behavioral Therapy and supportive  Diagnosis:Major depressive disorder, recurrent episode, moderate (HCC)  Plan: pt to f/u in 2 weeks for counseling.  Pt to f/u as scheduled w/ her PCP and healthy weight and  wellness.   Individualized Treatment Plan Strengths: enjoys travel, enjoys reading, attends gym 4-5 times a week.  Supports: friends.  Travel buddy.   Goal/Needs for Treatment:  In order of importance to patient 1) cope w/ unhappy feelings w/out eating 2) cope w/ stressors 3) --   Client Statement of Needs: "get past these blockages.  If I can make headway on my stressors and Get a handle on this weight I would feel happy.   the more unhappy I am the more I eat.  I want A healhty balance."    Treatment Level: outpatient counseling .    Symptoms:unhappy, stressed, overwhelmed, emotional eating  Client Treatment Preferences:every 1-2 weeks cousneling.  Continue w/ healthy weight and wellness and f/u w/ PCP.   Healthcare consumer's goal for treatment:  Counselor, Forde Radon, [facility] will support the patient's ability to achieve the goals identified. Cognitive Behavioral Therapy, Assertive Communication/Conflict Resolution Training, Relaxation Training, ACT, Humanistic and other evidenced-based practices will be used to promote progress towards healthy functioning.   Healthcare consumer will: Actively participate in therapy, working towards healthy functioning.    *Justification for Continuation/Discontinuation of Goal: R=Revised, O=Ongoing, A=Achieved, D=Discontinued  Goal 1) Decrease depressed moods and emotional eating AEB pt report.  Baseline date 12/22/22: Progress towards goal 0; How Often - Daily Target Date Goal Was reviewed Status Code Progress towards goal/Likert rating  12/22/23                Goal 2) Increased effective coping w/ stressors AEB pt report of daily self care, utilizing coping skills and reduced anxiety/depression.  Baseline date 12/22/22: Progress towards goal 0; How Often - Daily Target Date Goal Was reviewed Status Code Progress towards goal  12/22/23                 This plan has been reviewed and created by the following participants:  This plan will be  reviewed at least every 12 months. Date Behavioral Health Clinician Date Guardian/Patient   12/22/22  [facility] 12/22/22 Verbal Consent Provided                        Forde Radon [facility]

## 2023-02-23 DIAGNOSIS — D509 Iron deficiency anemia, unspecified: Secondary | ICD-10-CM | POA: Diagnosis not present

## 2023-02-23 DIAGNOSIS — R7989 Other specified abnormal findings of blood chemistry: Secondary | ICD-10-CM | POA: Diagnosis not present

## 2023-02-23 NOTE — Progress Notes (Signed)
TeleHealth Visit:  This visit was completed with telemedicine (audio/video) technology. [name] [name] has verbally consented to this TeleHealth visit. The patient is located at home, the provider is located at home. The participants in this visit include the listed provider and patient. The visit was conducted today via MyChart video.  OBESITY [name] [name] is here to discuss her progress with her obesity treatment plan along with follow-up of her obesity related diagnoses.   Today's visit was # 77 Starting weight: 258 lbs Starting date: 05/07/2018 Weight at last in office visit: 194.6 lbs on 02/10/23 Today's reported weight (02/24/23):  187.3 lbs  Today's vital per pt: BP- 116/63 HR- 60  Nutrition Plan: the Category 2 plan and practicing portion control and making smarter food choices, such as increasing vegetables and decreasing simple carbohydrates - adherence.  Current exercise:  gym (30 cardio/bike/elliptical and 30-40 minutes weights) 4-5 days per week. Personal trainer once weekly.  Interim History:  She notes less hunger with the Mounjaro. She was on plan 9 out of 14 days.  She is doing well with meal plan and exercises. She is finally on a downward trend with weight loss since starting Mounjaro.  Assessment/Plan:  1. Elevated LFTs Liver enzymes elevated on 01/21/2023-AST was 132, ALT was 58.  They were normal before that.  We rechecked them on 02/23/2023 and liver enzymes were back in the normal range.  She has significantly cut back on alcohol intake. No fatty liver noted on CT scans done for cancer surveillance.  She is status post renal carcinoma.  Lab Results  Component Value Date   ALT 19 02/23/2023   AST 27 02/23/2023   ALKPHOS 73 02/23/2023   BILITOT 0.3 02/23/2023    Plan: Continue to monitor.  2. Hypertension Hypertension well controlled.  Medication(s): Losartan-HCTZ 100-25 daily. Labs show mild hyponatremia.  Has consistently had mild hyponatremia since  2021. Has issues with peripheral edema. BP Readings from Last 3 Encounters:  11/21/22 (!) 163/72  11/18/22 132/80  10/29/22 (!) 165/89   Lab Results  Component Value Date   CREATININE 1.27 (H) 02/23/2023   CREATININE 1.27 (H) 01/21/2023   CREATININE 1.04 (H) 04/24/2022   No results found for: "GFR"  Plan: Take one half dose of losartan-HCTZ 100-25 mg daily (50-12.5 mg) until next office visit.   3. Chronic Kidney Disease Stage 3a Lab results and trends reviewed.  She has 1 kidney-had nephrectomy for cancer.  Sees urologist every 4 months.  She says he is satisfied with her kidney function. Has chronic hyponatremia.  On bupropion which can contribute to this but she says she had low sodium before she started the bupropion. Patient has hypertension and DM which are both very well-controlled.   Avoids NSAIDS due to being on Xarelto. Last GFR was 45, same as last check on 01/21/23.[name] No results found for: "GFR" Lab Results  Component Value Date   CREATININE 1.27 (H) 02/23/2023   BUN 24 02/23/2023   NA 126 (L) 02/23/2023   K 4.3 02/23/2023   CL 88 (L) 02/23/2023   CO2 23 02/23/2023    Plan: Continue adequate water intake. Continue to avoid nephrotoxic medications such as NSAIDs.  Continue to maximize control of diabetes and hypertension. I will continue to monitor.  4. Iron deficiency anemia Anemia is improving.  Hemoglobin has increased from 9.4 on 01/21/2023 211.0 on 02/23/2023. Iron supplementation: Iron 325 mg by mouth daily Lab Results  Component Value Date   WBC 6.5 02/23/2023   HGB 11.0 (L)  02/23/2023   HCT 35.1 02/23/2023   MCV 81 02/23/2023   PLT 258 02/23/2023   Lab Results  Component Value Date   IRON 68 06/11/2021   TIBC 299 06/11/2021   FERRITIN 19 01/21/2023     Plan: Continue supplementation at current dose   5. Morbid Obesity: Current BMI 33 Continue Mounjaro at 12.5 mg weekly.  Will increase to 15 mg at next refill.  [name] is currently in the  action stage of change. As such, her goal is to continue with weight loss efforts.  She has agreed to the Category 2 plan.  Exercise goals:  as is  Behavioral modification strategies: increasing lean protein intake, decreasing simple carbohydrates , and planning for success.  Erine has agreed to follow-up with our clinic in 2 weeks.  No orders of the defined types were placed in this encounter.   There are no discontinued medications.   No orders of the defined types were placed in this encounter.     Objective:   VITALS: Per patient if applicable, see vitals. GENERAL: Alert and in no acute distress. CARDIOPULMONARY: No increased WOB. Speaking in clear sentences.  PSYCH: Pleasant and cooperative. Speech normal rate and rhythm. Affect is appropriate. Insight and judgement are appropriate. Attention is focused, linear, and appropriate.  NEURO: Oriented as arrived to appointment on time with no prompting.   Attestation Statements:   Reviewed by clinician on day of visit: allergies, medications, problem list, medical history, surgical history, family history, social history, and previous encounter notes.  Time spent on visit including the items listed below was 30 minutes.  -preparing to see the patient (e.g., review of tests, history, previous notes) -obtaining and/or reviewing separately obtained history -counseling and educating the patient/family/caregiver -documenting clinical information in the electronic or other health record -ordering medications, tests, or procedures -independently interpreting results and communicating results to the patient/ family/caregiver -referring and communicating with other health care professionals  -care coordination   This was prepared with the assistance of Engineer, civil (consulting).  Occasional wrong-word or sound-a-like substitutions may have occurred due to the inherent limitations of voice recognition software.

## 2023-02-24 ENCOUNTER — Encounter (INDEPENDENT_AMBULATORY_CARE_PROVIDER_SITE_OTHER): Payer: Self-pay | Admitting: Family Medicine

## 2023-02-24 ENCOUNTER — Telehealth (INDEPENDENT_AMBULATORY_CARE_PROVIDER_SITE_OTHER): Payer: Medicare Other | Admitting: Family Medicine

## 2023-02-24 VITALS — Ht 63.0 in | Wt 187.0 lb

## 2023-02-24 DIAGNOSIS — D509 Iron deficiency anemia, unspecified: Secondary | ICD-10-CM

## 2023-02-24 DIAGNOSIS — I10 Essential (primary) hypertension: Secondary | ICD-10-CM

## 2023-02-24 DIAGNOSIS — Z6833 Body mass index (BMI) 33.0-33.9, adult: Secondary | ICD-10-CM | POA: Diagnosis not present

## 2023-02-24 DIAGNOSIS — N1831 Chronic kidney disease, stage 3a: Secondary | ICD-10-CM

## 2023-02-24 DIAGNOSIS — E1159 Type 2 diabetes mellitus with other circulatory complications: Secondary | ICD-10-CM

## 2023-02-24 DIAGNOSIS — I152 Hypertension secondary to endocrine disorders: Secondary | ICD-10-CM

## 2023-02-24 DIAGNOSIS — R7989 Other specified abnormal findings of blood chemistry: Secondary | ICD-10-CM | POA: Diagnosis not present

## 2023-02-24 LAB — CBC WITH DIFFERENTIAL/PLATELET
Basophils Absolute: 0 10*3/uL (ref 0.0–0.2)
Basos: 1 %
EOS (ABSOLUTE): 0.1 10*3/uL (ref 0.0–0.4)
Eos: 1 %
Hematocrit: 35.1 % (ref 34.0–46.6)
Hemoglobin: 11 g/dL — ABNORMAL LOW (ref 11.1–15.9)
Immature Grans (Abs): 0 10*3/uL (ref 0.0–0.1)
Immature Granulocytes: 0 %
Lymphocytes Absolute: 1.3 10*3/uL (ref 0.7–3.1)
Lymphs: 20 %
MCH: 25.4 pg — ABNORMAL LOW (ref 26.6–33.0)
MCHC: 31.3 g/dL — ABNORMAL LOW (ref 31.5–35.7)
MCV: 81 fL (ref 79–97)
Monocytes Absolute: 0.7 10*3/uL (ref 0.1–0.9)
Monocytes: 10 %
Neutrophils Absolute: 4.4 10*3/uL (ref 1.4–7.0)
Neutrophils: 68 %
Platelets: 258 10*3/uL (ref 150–450)
RBC: 4.33 x10E6/uL (ref 3.77–5.28)
RDW: 18.2 % — ABNORMAL HIGH (ref 11.7–15.4)
WBC: 6.5 10*3/uL (ref 3.4–10.8)

## 2023-02-24 LAB — COMPREHENSIVE METABOLIC PANEL
ALT: 19 IU/L (ref 0–32)
AST: 27 IU/L (ref 0–40)
Albumin/Globulin Ratio: 2.1 (ref 1.2–2.2)
Albumin: 4.6 g/dL (ref 3.8–4.8)
Alkaline Phosphatase: 73 IU/L (ref 44–121)
BUN/Creatinine Ratio: 19 (ref 12–28)
BUN: 24 mg/dL (ref 8–27)
Bilirubin Total: 0.3 mg/dL (ref 0.0–1.2)
CO2: 23 mmol/L (ref 20–29)
Calcium: 9.7 mg/dL (ref 8.7–10.3)
Chloride: 88 mmol/L — ABNORMAL LOW (ref 96–106)
Creatinine, Ser: 1.27 mg/dL — ABNORMAL HIGH (ref 0.57–1.00)
Globulin, Total: 2.2 g/dL (ref 1.5–4.5)
Glucose: 69 mg/dL — ABNORMAL LOW (ref 70–99)
Potassium: 4.3 mmol/L (ref 3.5–5.2)
Sodium: 126 mmol/L — ABNORMAL LOW (ref 134–144)
Total Protein: 6.8 g/dL (ref 6.0–8.5)
eGFR: 45 mL/min/{1.73_m2} — ABNORMAL LOW (ref >59)

## 2023-03-15 ENCOUNTER — Ambulatory Visit (INDEPENDENT_AMBULATORY_CARE_PROVIDER_SITE_OTHER): Payer: Medicare Other | Admitting: Psychology

## 2023-03-15 DIAGNOSIS — F330 Major depressive disorder, recurrent, mild: Secondary | ICD-10-CM

## 2023-03-15 NOTE — Progress Notes (Signed)
[facility] Counselor/Therapist Progress Note  Patient ID: [name] [name], MRN: [id number],    Date: 03/15/2023  Time Spent: 8:00am-8:45am   Treatment Type: Individual Therapy  pt is seen for a virtual video visit via caregility. Pt consent to telehealth visit and acknowledges limitations of telehealth visits. Pt joins from her home, reporting privacy, and counselor from her home office.  Reported Symptoms: mood good,  improved interactions w/ mom.  Struggling w/ following her food plan, but acknowledging progress overall.  Mental Status Exam: Appearance:  Well Groomed     Behavior: Appropriate  Motor: Normal  Speech/Language:  Normal Rate  Affect: Appropriate  Mood: normal  Thought process: normal  Thought content:   WNL  Sensory/Perceptual disturbances:   WNL  Orientation: oriented to person, place, time/date, and situation  Attention: Good  Concentration: Good  Memory: WNL  Fund of knowledge:  Good  Insight:   Good  Judgment:  Good  Impulse Control: Good   Risk Assessment: Danger to Self:  No Self-injurious Behavior: No Danger to Others: No Duty to Warn:no Physical Aggression / Violence:No  Access to Firearms a concern: No  Gang Involvement:No   Subjective: Counselor assessed pt current functioning per pt report. Processed w/pt her mood, positives and stressors.  Explored w/ pt interactions w/ mom- feeling of on edge and recognizing worry and related what ifs and ways to focus on what is present and knows now.  Discussed encouraging self talk despite challenges w/ food plan and recognizing changes making.   Pt affect bright.  Pt reports overall her mood is good- a couple of down day moods in past month.  Pt reports that interactions w/ her mom have been overall good and recognizing that she is making change in ways approaches.  Pt also states worry about next day she escalates and able to recognize and reframe to focus on present.  Pt reports had about a  week of doing well w/ food plan and then has been struggling more. Pt able to encourage self and recognize steps still taking and small progress.      Interventions: Cognitive Behavioral Therapy and supportive  Diagnosis:Mild episode of recurrent major depressive disorder (HCC)  Plan: pt to f/u in 4 weeks for counseling.  Pt to f/u as scheduled w/ her PCP and healthy weight and wellness.   Individualized Treatment Plan Strengths: enjoys travel, enjoys reading, attends gym 4-5 times a week.  Supports: friends.  Travel buddy.   Goal/Needs for Treatment:  In order of importance to patient 1) cope w/ unhappy feelings w/out eating 2) cope w/ stressors 3) --   Client Statement of Needs: "get past these blockages.  If I can make headway on my stressors and Get a handle on this weight I would feel happy.   the more unhappy I am the more I eat.  I want A healhty balance."    Treatment Level: outpatient counseling .    Symptoms:unhappy, stressed, overwhelmed, emotional eating  Client Treatment Preferences:every 1-2 weeks cousneling.  Continue w/ healthy weight and wellness and f/u w/ PCP.   Healthcare consumer's goal for treatment:  Counselor, [name] [name], [facility] will support the patient's ability to achieve the goals identified. Cognitive Behavioral Therapy, Assertive Communication/Conflict Resolution Training, Relaxation Training, ACT, Humanistic and other evidenced-based practices will be used to promote progress towards healthy functioning.   Healthcare consumer will: Actively participate in therapy, working towards healthy functioning.    *Justification for Continuation/Discontinuation of Goal: R=Revised,  O=Ongoing, A=Achieved, D=Discontinued  Goal 1) Decrease depressed moods and emotional eating AEB pt report.  Baseline date 12/22/22: Progress towards goal 0; How Often - Daily Target Date Goal Was reviewed Status Code Progress towards goal/Likert rating  12/22/23                Goal 2)  Increased effective coping w/ stressors AEB pt report of daily self care, utilizing coping skills and reduced anxiety/depression.   Baseline date 12/22/22: Progress towards goal 0; How Often - Daily Target Date Goal Was reviewed Status Code Progress towards goal  12/22/23                 This plan has been reviewed and created by the following participants:  This plan will be reviewed at least every 12 months. Date Behavioral Health Clinician Date Guardian/Patient   12/22/22  [facility] [name] [name] [facility] 12/22/22 Verbal Consent Provided                       [name] [name]

## 2023-03-15 NOTE — Progress Notes (Addendum)
TeleHealth Visit:  This visit was completed with telemedicine (audio/video) technology. [name] [name] has verbally consented to this TeleHealth visit. The patient is located at home, the provider is located at home. The participants in this visit include the listed provider and patient. The visit was conducted today via MyChart video.  OBESITY [name] [name] is here to discuss her progress with her obesity treatment plan along with follow-up of her obesity related diagnoses.    Today's visit was # 78  Starting weight: 258 lbs Starting date: 05/07/2018 Weight reported at last virtual office visit: 187.3 lbs on 02/24/23 Today's reported weight (03/16/23):  189.6 lbs Total weight loss: 69 lbs Weight change since last visit: +2 lbs   Today's vital per pt: BP- 116/73 HR- 62  Nutrition Plan: the Category 2 plan and practicing portion control and making smarter food choices, such as increasing vegetables and decreasing simple carbohydrates   Current exercise:  gym (30 cardio/bike/elliptical and 30-40 minutes weights) 4-5 days per week. Personal trainer once weekly.  Interim History:  She did really well for a week after last visit.  She has done better with portion control of carbohydrates such as pretzels. She is having breakfast on plan about 2/3 of the time. Lunch was on plan for first week after last visit.  Hunger is well controlled. Struggles with emotional/boredom eating.  She is going to [place] for 4 days after her birthday ([birth year]). She feels she will be off plan on her trip.  Assessment/Plan:  1. Hypertension Hypertension well controlled.  BP this am- 116/73. Home BPs have been very good. She has been taking the full dose of Hyzaar- not the half dose prescribed at virtual visit. .  Medication(s): losartan- hydrochlorothiazide 100-25 mg daily  BP Readings from Last 3 Encounters:  11/21/22 (!) 163/72  11/18/22 132/80  10/29/22 (!) 165/89   Lab Results  Component Value Date    CREATININE 1.27 (H) 02/23/2023   CREATININE 1.27 (H) 01/21/2023   CREATININE 1.04 (H) 04/24/2022   No results found for: "GFR"  Plan: Continue all antihypertensives at current dosages.  2. 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 Last A1c was 5.4 Medication(s): Mounjaro 15 mg SQ weekly Hunger is controlled with Mounjaro. She would like to increase the dose. Lab Results  Component Value Date   HGBA1C 5.4 01/21/2023   HGBA1C 5.2 10/23/2021   HGBA1C 4.8 06/11/2021   Lab Results  Component Value Date   LDLCALC 34 01/21/2023   CREATININE 1.27 (H) 02/23/2023   No results found for: "GFR"  Plan: Continue and increase dose Mounjaro 15 mg SQ weekly   3. Morbid Obesity: Current BMI 33 [name] [name] is currently in the action stage of change. As such, her goal is to continue with weight loss efforts.  She has agreed to the Category 2 plan.  Exercise goals:  as is  Behavioral modification strategies: increasing lean protein intake, decreasing simple carbohydrates , meal planning , and planning for success.  [name] [name] has agreed to follow-up with our clinic in 3 weeks.  No orders of the defined types were placed in this encounter.   Medications Discontinued During This Encounter  Medication Reason   tirzepatide (MOUNJARO) 12.5 MG/0.5ML Pen Dose change     Meds ordered this encounter  Medications   tirzepatide (MOUNJARO) 15 MG/0.5ML Pen    Sig: Inject 15 mg into the skin once a week.    Dispense:  2 mL  Refill:  0    Order Specific Question:   Supervising Provider    Answer:   Glennis Brink [[id number]]      Objective:   VITALS: Per patient if applicable, see vitals. GENERAL: Alert and in no acute distress. CARDIOPULMONARY: No increased WOB. Speaking in clear sentences.  PSYCH: Pleasant and cooperative. Speech normal rate and rhythm. Affect is appropriate. Insight and judgement are appropriate. Attention is focused,  linear, and appropriate.  NEURO: Oriented as arrived to appointment on time with no prompting.   Attestation Statements:   Reviewed by clinician on day of visit: allergies, medications, problem list, medical history, surgical history, family history, social history, and previous encounter notes.   This was prepared with the assistance of Dragon [employer].  Occasional wrong-word or sound-a-like substitutions may have occurred due to the inherent limitations of voice recognition software.

## 2023-03-16 ENCOUNTER — Telehealth (INDEPENDENT_AMBULATORY_CARE_PROVIDER_SITE_OTHER): Payer: Medicare Other | Admitting: Family Medicine

## 2023-03-16 ENCOUNTER — Encounter (INDEPENDENT_AMBULATORY_CARE_PROVIDER_SITE_OTHER): Payer: Self-pay | Admitting: Family Medicine

## 2023-03-16 VITALS — Ht 63.0 in | Wt 189.0 lb

## 2023-03-16 DIAGNOSIS — L300 Nummular dermatitis: Secondary | ICD-10-CM | POA: Diagnosis not present

## 2023-03-16 DIAGNOSIS — E1159 Type 2 diabetes mellitus with other circulatory complications: Secondary | ICD-10-CM

## 2023-03-16 DIAGNOSIS — N1831 Chronic kidney disease, stage 3a: Secondary | ICD-10-CM | POA: Diagnosis not present

## 2023-03-16 DIAGNOSIS — I152 Hypertension secondary to endocrine disorders: Secondary | ICD-10-CM

## 2023-03-16 DIAGNOSIS — L570 Actinic keratosis: Secondary | ICD-10-CM | POA: Diagnosis not present

## 2023-03-16 DIAGNOSIS — E1122 Type 2 diabetes mellitus with diabetic chronic kidney disease: Secondary | ICD-10-CM

## 2023-03-16 DIAGNOSIS — Z6833 Body mass index (BMI) 33.0-33.9, adult: Secondary | ICD-10-CM | POA: Diagnosis not present

## 2023-03-16 DIAGNOSIS — L720 Epidermal cyst: Secondary | ICD-10-CM | POA: Diagnosis not present

## 2023-03-16 DIAGNOSIS — Z7985 Long-term (current) use of injectable non-insulin antidiabetic drugs: Secondary | ICD-10-CM | POA: Diagnosis not present

## 2023-03-16 DIAGNOSIS — L249 Irritant contact dermatitis, unspecified cause: Secondary | ICD-10-CM | POA: Diagnosis not present

## 2023-03-16 DIAGNOSIS — Z129 Encounter for screening for malignant neoplasm, site unspecified: Secondary | ICD-10-CM | POA: Diagnosis not present

## 2023-03-16 MED ORDER — TIRZEPATIDE 15 MG/0.5ML ~~LOC~~ SOAJ
15.0000 mg | SUBCUTANEOUS | 0 refills | Status: DC
Start: 2023-03-16 — End: 2023-04-01

## 2023-04-01 ENCOUNTER — Other Ambulatory Visit (HOSPITAL_BASED_OUTPATIENT_CLINIC_OR_DEPARTMENT_OTHER): Payer: Self-pay

## 2023-04-01 ENCOUNTER — Other Ambulatory Visit (INDEPENDENT_AMBULATORY_CARE_PROVIDER_SITE_OTHER): Payer: Self-pay | Admitting: Family Medicine

## 2023-04-01 DIAGNOSIS — E1159 Type 2 diabetes mellitus with other circulatory complications: Secondary | ICD-10-CM

## 2023-04-01 MED ORDER — TIRZEPATIDE 15 MG/0.5ML ~~LOC~~ SOAJ
15.0000 mg | SUBCUTANEOUS | 0 refills | Status: DC
Start: 2023-04-01 — End: 2023-04-13
  Filled 2023-04-01: qty 2, 28d supply, fill #0

## 2023-04-06 ENCOUNTER — Ambulatory Visit (INDEPENDENT_AMBULATORY_CARE_PROVIDER_SITE_OTHER): Payer: Medicare Other | Admitting: Neurology

## 2023-04-06 DIAGNOSIS — G629 Polyneuropathy, unspecified: Secondary | ICD-10-CM

## 2023-04-06 DIAGNOSIS — G90511 Complex regional pain syndrome I of right upper limb: Secondary | ICD-10-CM

## 2023-04-06 DIAGNOSIS — M62472 Contracture of muscle, left ankle and foot: Secondary | ICD-10-CM

## 2023-04-06 DIAGNOSIS — G5702 Lesion of sciatic nerve, left lower limb: Secondary | ICD-10-CM | POA: Diagnosis not present

## 2023-04-06 DIAGNOSIS — E1142 Type 2 diabetes mellitus with diabetic polyneuropathy: Secondary | ICD-10-CM

## 2023-04-06 MED ORDER — ONABOTULINUMTOXINA 200 UNITS IJ SOLR
200.0000 [IU] | Freq: Once | INTRAMUSCULAR | Status: DC
Start: 2023-04-06 — End: 2024-03-16

## 2023-04-06 MED ORDER — TRAMADOL HCL 50 MG PO TABS: 50.0000 mg | ORAL_TABLET | Freq: Four times a day (QID) | ORAL | 0 refills | Status: DC | PRN

## 2023-04-06 NOTE — Progress Notes (Signed)
04/07/2023; 200 Units. Also will try qulipta. 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 Hx of diabetic(per-diabetic) neuropathy.    Procedure: All procedures documented were medically necessary, reasonable and appropriate based on the patient's history, medical diagnosis and physician opinion. Verbal informed consent was obtained from the patient, patient was informed of potential risk of procedure, including bruising, bleeding, hematoma formation, infection, muscle weakness, muscle pain, numbness, transient hypertension, transient hyperglycemia and transient insomnia among others. All areas injected were topically clean with isopropyl rubbing alcohol. Nonsterile nonlatex gloves were worn during the procedure.    Injections were given unilateral to the left plantar surface of the foot medial surface ask her to point out the area of pain and the extensor digitorum brevis muscle and Tibials posterior. 200 units of Botox were used and none was wasted.   Dx: Z61.096  (In the future if we continue: [phone] CPT code and no additional limb today but [phone] for additional limb)   Patient and I also talked about CPRS, the very unusual place for diabetic neuropathy, she also has knee pain could be peroneal neuropathy, I explained what CPRS is and we showed some pictures online and also what peroneal neuropathy is and showed her images online.  We decided to  try Qutenza for the pain, we discussed sympathetic nerve blocks if it is CPRS, and we decided to order an EMG nerve conduction study on her lower extremities.  This was all in addition to above procedure.  I spent over 10 minutes of face-to-face and non-face-to-face time with patient on the peroneal neuropathy or cprs   diagnosis.  This included previsit chart review, lab review, study review, order entry, electronic health record documentation, patient education on the different diagnostic and therapeutic options, counseling and coordination of care, risks and benefits of management, compliance, or risk factor reduction.  This does not include procedure time above. Gave tramadol, limited supply for pain.   Orders Placed This Encounter  Procedures   NCV with EMG(electromyography)   Meds ordered this encounter  Medications   botulinum toxin Type A (BOTOX) injection 200 Units    Botox- 200 units x 1 vial Lot: [id number] Expiration: 04/2025 NDC: 6213-0865-78  Bacteriostatic 0.9% Sodium Chloride- 4 mL  Lot: [id number] Expiration: :07/22/2024 BMW:4132-4401-02  Dx: V25.366  B/B Witnessed by Elana Alm   traMADol (ULTRAM) 50 MG tablet    Sig: Take 1-4 tablets (50-200 mg total) by mouth every 6 (six) hours as needed. 400mg  max daily.    Dispense:  30 tablet    Refill:  0

## 2023-04-06 NOTE — Patient Instructions (Signed)
Capsaicin Patches What is this medication? CAPSAICIN (cap SAY sin) relieves minor pain in your muscles and joints. It works by making your skin feel warm or cool, which blocks pain signals going to the brain. This medicine may be used for other purposes; ask your health care provider or pharmacist if you have questions. COMMON BRAND NAME(S): Qutenza What should I tell my care team before I take this medication? They need to know if you have any of these conditions: Broken or irritated skin High blood pressure History of heart attack or stroke An unusual or allergic reaction to capsaicin, hot peppers, other medications, foods, dyes, or preservatives Pregnant or trying to get pregnant Breast-feeding How should I use this medication? This medication is for external use only. It is applied by your care team in a hospital or clinic setting. Talk to your care team about the use of this medication in children. Special care may be needed. Overdosage: If you think you have taken too much of this medicine contact a poison control center or emergency room at once. NOTE: This medicine is only for you. Do not share this medicine with others. What if I miss a dose? This does not apply. What may interact with this medication? Interactions are not expected. Do not use any other skin products on the affected area without asking your care team. This list may not describe all possible interactions. Give your health care provider a list of all the medicines, herbs, non-prescription drugs, or dietary supplements you use. Also tell them if you smoke, drink alcohol, or use illegal drugs. Some items may interact with your medicine. What should I watch for while using this medication? Your condition will be monitored carefully while you are receiving this medication. Your blood pressure may go up during the procedure. Do not touch the medication patch during treatment. This medication causes red, burning skin. You  may need pain medication for during and after the procedure. This medication can make you more sensitive to heat for a few days after treatment. Be careful in hot showers or baths. Keep out of the sun. Exercise may make the treated skin feel hotter. Tell your care team if your symptoms do not start to get better or if they get worse. What side effects may I notice from receiving this medication? Side effects that you should report to your care team as soon as possible: Allergic reactions--skin rash, itching, hives, swelling of the face, lips, tongue, or throat Side effects that usually do not require medical attention (report these to your care team if they continue or are bothersome): Mild skin irritation, redness, or dryness This list may not describe all possible side effects. Call your doctor for medical advice about side effects. You may report side effects to FDA at 1-800-FDA-1088. Where should I keep my medication? This medication is given in a hospital or clinic. It will not be stored at home. NOTE: This sheet is a summary. It may not cover all possible information. If you have questions about this medicine, talk to your doctor, pharmacist, or health care provider.  2024 Elsevier/Gold Standard (2021-07-08 00:00:00)  

## 2023-04-06 NOTE — Progress Notes (Deleted)
  TeleHealth Visit:  This visit was completed with telemedicine (audio/video) technology. [name] [name] has verbally consented to this TeleHealth visit. The patient is located at home, the provider is located at home. The participants in this visit include the listed provider and patient. The visit was conducted today via MyChart video.  OBESITY [name] [name] is here to discuss her progress with her obesity treatment plan along with follow-up of her obesity related diagnoses.    Today's visit was # 79  Starting weight: 258 lbs Starting date: 05/07/2018 Weight reported at last virtual office visit: 189.6 lbs on 03/16/23 Today's reported weight (***): {dwwweightreported:29243} Weight at clinic on ***: *** lbs Total weight loss: *** lbs Weight change since last visit: *** lbs  Today's vital per pt: BP- *** HR- ***  Nutrition Plan: the Category 2 plan and practicing portion control and making smarter food choices, such as increasing vegetables and decreasing simple carbohydrates - ***% adherence.  Current exercise: {exercise types:16438} gym (30 cardio/bike/elliptical and 30-40 minutes weights) 4-5 days per week. Personal trainer once weekly.  Interim History:  ***  Eating all of the prescribed protein: {yes***/no:17258} Skipping meals: {dwwyes:29172} Drinking adequate water: {dwwyes:29172} Drinking sugar sweetened beverages: {dwwyes:29172} Hunger controlled: {EWCONTROLASSESSMENT:24261}. Cravings controlled:  {EWCONTROLASSESSMENT:24261}.  Journaling Consistently:  {dwwyes:29172} Meeting protein goals:  {dwwyes:29172} Meeting calorie goals:  {dwwyes:29172}  Pharmacotherapy: [name] [name] is on {dwwglp:29109}. Adverse side effects: {dwwse:29122} Hunger is {EWCONTROLASSESSMENT:24261}.  Assessment/Plan:  1. ***  2. ***  3. ***  {dwwmorbid:29108::"Morbid Obesity"}: Current BMI ***  Pharmacotherapy Plan {dwwmed:29123} {dwwglp:29109}.   [name] [name] {CHL AMB IS/IS NOT:210130109} currently in  the action stage of change. As such, her goal is to {MWMwtloss#1:210800005}.  She has agreed to {dwwsldiets:29085}.  Exercise goals: {MWM EXERCISE RECS:23473}  Behavioral modification strategies: {dwwslwtlossstrategies:29088}.  [name] [name] has agreed to follow-up with our clinic in {NUMBER 1-10:22536} {dwwfutime:29619}  No orders of the defined types were placed in this encounter.   There are no discontinued medications.   No orders of the defined types were placed in this encounter.     Objective:   VITALS: Per patient if applicable, see vitals. GENERAL: Alert and in no acute distress. CARDIOPULMONARY: No increased WOB. Speaking in clear sentences.  PSYCH: Pleasant and cooperative. Speech normal rate and rhythm. Affect is appropriate. Insight and judgement are appropriate. Attention is focused, linear, and appropriate.  NEURO: Oriented as arrived to appointment on time with no prompting.   Attestation Statements:   Reviewed by clinician on day of visit: allergies, medications, problem list, medical history, surgical history, family history, social history, and previous encounter notes.  ***(delete if time-based billing not used) Time spent on visit including the items listed below was *** minutes.  -preparing to see the patient (e.g., review of tests, history, previous notes) -obtaining and/or reviewing separately obtained history -counseling and educating the patient/family/caregiver -documenting clinical information in the electronic or other health record -ordering medications, tests, or procedures -independently interpreting results and communicating results to the patient/ family/caregiver -referring and communicating with other health care professionals  -care coordination    This was prepared with the assistance of Dragon [employer].  Occasional wrong-word or sound-a-like substitutions may have occurred due to the inherent limitations of voice recognition software.

## 2023-04-06 NOTE — Progress Notes (Unsigned)
Botox- 200 units x 1 vial Lot: [id number] Expiration: 04/2025 NDC: 8657-8469-62  Bacteriostatic 0.9% Sodium Chloride- 4 mL  Lot: [id number] Expiration: :07/22/2024 LKG:[id number]  Dx: U44.034  B/B Witnessed by Elana Alm

## 2023-04-07 ENCOUNTER — Encounter (HOSPITAL_COMMUNITY): Payer: Self-pay

## 2023-04-07 ENCOUNTER — Encounter (INDEPENDENT_AMBULATORY_CARE_PROVIDER_SITE_OTHER): Payer: Self-pay

## 2023-04-07 ENCOUNTER — Telehealth (INDEPENDENT_AMBULATORY_CARE_PROVIDER_SITE_OTHER): Payer: Medicare Other | Admitting: Family Medicine

## 2023-04-07 ENCOUNTER — Emergency Department (HOSPITAL_COMMUNITY)
Admission: EM | Admit: 2023-04-07 | Discharge: 2023-04-07 | Disposition: A | Discharge status: Home / Self Care | Payer: Medicare Other | Attending: Emergency Medicine | Admitting: Emergency Medicine

## 2023-04-07 ENCOUNTER — Encounter: Payer: Self-pay | Admitting: Neurology

## 2023-04-07 DIAGNOSIS — E1122 Type 2 diabetes mellitus with diabetic chronic kidney disease: Secondary | ICD-10-CM

## 2023-04-07 DIAGNOSIS — Z7901 Long term (current) use of anticoagulants: Secondary | ICD-10-CM | POA: Diagnosis not present

## 2023-04-07 DIAGNOSIS — R58 Hemorrhage, not elsewhere classified: Secondary | ICD-10-CM | POA: Diagnosis not present

## 2023-04-07 DIAGNOSIS — R040 Epistaxis: Secondary | ICD-10-CM | POA: Diagnosis not present

## 2023-04-07 DIAGNOSIS — T17920A Food in respiratory tract, part unspecified causing asphyxiation, initial encounter: Secondary | ICD-10-CM | POA: Diagnosis not present

## 2023-04-07 MED ORDER — OXYMETAZOLINE HCL 0.05 % NA SOLN
1.0000 | Freq: Once | NASAL | Status: AC
  Administered 2023-04-07: 1 via NASAL
  Filled 2023-04-07: qty 30

## 2023-04-07 MED ORDER — TRANEXAMIC ACID FOR EPISTAXIS
500.0000 mg | Freq: Once | TOPICAL | Status: AC
  Administered 2023-04-07: 500 mg via TOPICAL
  Filled 2023-04-07: qty 10

## 2023-04-07 NOTE — ED Provider Notes (Signed)
[place] EMERGENCY DEPARTMENT AT [facility] Provider Note   CSN: [id number] Arrival date & time: 04/07/23  0865     History  Chief Complaint  Patient presents with   Epistaxis    [name] [name] is a 72 y.o. female.  Patient is a 72 year old woman with a history of DVT on Xarelto presenting to the ED with a nosebleed. Patient reports bleeding began early this morning while blowing her nose. EMS tried Afrin x 2, not effective. Patient has had multiple similar episodes in the past that have stopped on their own.  Denies dizziness, lightheadedness, weakness.    Epistaxis      Home Medications Prior to Admission medications   Medication Sig Start Date End Date Taking? Authorizing Provider  alprazolam Prudy Feeler) 2 MG tablet Take 1 tablet (2 mg total) by mouth 3 (three) times daily as needed for sleep. 09/29/22   Anson Fret, MD  buPROPion (WELLBUTRIN XL) 300 MG 24 hr tablet Take 1 tablet (300 mg total) by mouth daily. 02/10/23   Whitmire, Thermon Leyland, FNP  capsicum (ZOSTRIX) 0.075 % topical cream Apply 1 application topically 3 (three) times daily. 05/12/21   Anson Fret, MD  gabapentin (NEURONTIN) 300 MG capsule Take 2 capsules (600 mg total) by mouth 3 (three) times daily. 09/29/22   Anson Fret, MD  Iron, Ferrous Sulfate, 325 (65 Fe) MG TABS Take 325 mg by mouth daily. 02/10/23   Whitmire, Thermon Leyland, FNP  lidocaine-prilocaine (EMLA) cream Apply 1 Application topically as needed. 04/07/22   Anson Fret, MD  losartan-hydrochlorothiazide (HYZAAR) 100-25 MG tablet Take 1 tablet by mouth daily. 03/18/22   Whitmire, Thermon Leyland, FNP  magnesium oxide (MAG-OX) 400 MG tablet Take 400 mg by mouth at bedtime.     [provider]  Multiple Vitamins-Minerals (OCUVITE-LUTEIN PO) Take 1 tablet by mouth daily.    [provider]  NONFORMULARY OR COMPOUNDED ITEM Compound Cream 7A (allow alternate if needed for insurance): Amantadine 8%, Baclofen 2%, Gabapentin 8%,  Amitriptyline 4%, Bupivacaine 2%, Clonidine 0.2%, Nifedipine 2%.   Instructions: Apply 1-2 grams to the affected area 3-4 times daily. 02/23/22   Anson Fret, MD  rosuvastatin (CRESTOR) 20 MG tablet Take 20 mg by mouth at bedtime. 07/03/21   [provider]  tirzepatide Greggory Keen) 15 MG/0.5ML Pen Inject 15 mg into the skin once a week. 04/01/23   Whitmire, Thermon Leyland, FNP  traMADol (ULTRAM) 50 MG tablet Take 1-4 tablets (50-200 mg total) by mouth every 6 (six) hours as needed. 400mg  max daily. 04/06/23   Anson Fret, MD  XARELTO 20 MG TABS tablet TAKE 1 TABLET DAILY WITH   SUPPER 09/29/22   Anson Fret, MD      Allergies    Ambien [zolpidem], Atorvastatin, Codeine, Penicillins, Simvastatin, Meclizine, Meclizine hcl, Other, and Byetta 10 mcg pen [exenatide]    Review of Systems   Review of Systems  HENT:  Positive for nosebleeds.     Physical Exam Updated Vital Signs BP (!) 149/80   Pulse 69   Temp 97.6 F (36.4 C)   Resp 20   Ht 5\' 3"  (1.6 m)   Wt 84.8 kg   SpO2 99%   BMI 33.13 kg/m  Physical Exam Constitutional:      General: She is not in acute distress. Neurological:     Mental Status: She is alert.     ED Results / Procedures / Treatments   Labs (  all labs ordered are listed, but only abnormal results are displayed) Labs Reviewed  CBC WITH DIFFERENTIAL/PLATELET    EKG None  Radiology No results found.  Procedures Procedures    Medications Ordered in ED Medications  tranexamic acid (CYKLOKAPRON) 1000 MG/10ML topical solution 500 mg (has no administration in time range)  oxymetazoline (AFRIN) 0.05 % nasal spray 1 spray (has no administration in time range)    ED Course/ Medical Decision Making/ A&P                             Medical Decision Making Amount and/or Complexity of Data Reviewed Labs: ordered.  Risk OTC drugs.  Medical Decision Making:   [name] [name] is a 72 y.o. female who presented to the ED today with nosebleed  detailed above.    Patient's presentation is complicated by their history of anticoagulation on Xarelto.  Complete initial physical exam performed, notably the patient  was hemodynamically stable.    Reviewed and confirmed nursing documentation for past medical history, family history, social history.    Initial Assessment:   With the patient's presentation of nosebleed, most likely diagnosis is epistaxis. Other diagnoses were considered including (but not limited to) nasal tumor, AV malformation, angiofibroma. These are considered less likely due to history of present illness and physical exam findings.    Initial Plan:   Objective evaluation as below reviewed   Initial Study Results:   Laboratory  All laboratory results reviewed without evidence of clinically relevant pathology.   Exceptions include:    EKG EKG was reviewed independently. Rate, rhythm, axis, intervals all examined and without medically relevant abnormality. ST segments without concerns for elevations.    Radiology:  All images reviewed independently. Agree with radiology report at this time.   No results found.  Reassessment and Plan:   Patient is a 72 year old woman presenting to the ED with a nosebleed. Bleeding began while blowing nose this morning. Vitals remained stable throughout stay in the ED. Balloon packing with TXA was performed, which resulted in hemostasis. On reassessment, patient was resting comfortably in bed. Patient agreed with plan to be discharged.           Final Clinical Impression(s) / ED Diagnoses Final diagnoses:  None    Rx / DC Orders ED Discharge Orders     None         Monna Fam, MD 04/07/23 0865    Tegeler, Canary Brim, MD 04/08/23 1517

## 2023-04-07 NOTE — Discharge Instructions (Addendum)
Please follow up at our ENT clinic tomorrow or as soon as possible to remove balloon packing and evaluate any further nose bleeding.

## 2023-04-07 NOTE — ED Triage Notes (Signed)
Ems vitals  Bp 156/88 Pulse 72 98% on ra  Cbg 114

## 2023-04-07 NOTE — ED Provider Notes (Signed)
.  Epistaxis Management  Date/Time: 04/07/2023 8:08 AM  Performed by: Heide Scales, MD Authorized by: Heide Scales, MD   Consent:    Consent obtained:  Verbal   Consent given by:  Patient   Risks, benefits, and alternatives were discussed: yes     Risks discussed:  Bleeding and pain   Alternatives discussed:  No treatment Universal protocol:    Immediately prior to procedure, a time out was called: yes     Patient identity confirmed:  Verbally with patient Anesthesia:    Anesthesia method:  None Procedure details:    Treatment site:  R posterior   Treatment method:  Nasal balloon   Treatment complexity:  Limited   Treatment episode: initial   Post-procedure details:    Assessment:  Bleeding stopped   Procedure completion:  Tolerated Comments:     TXA soaked rapid rhino after afrin.      Fotios Amos, Canary Brim, MD 04/07/23 [phone]

## 2023-04-07 NOTE — ED Triage Notes (Signed)
Ems brings pt in from home. Pt nosebleed started at 5am . Pt reports blowing her nose and the bleeding started. Per ems 50 to 100 cc of blood at home. Pt takes xerleto 20 mg 1x daily. Pt given afrin x2 sprays per nostril no effect noted.

## 2023-04-12 DIAGNOSIS — Z7901 Long term (current) use of anticoagulants: Secondary | ICD-10-CM | POA: Diagnosis not present

## 2023-04-12 DIAGNOSIS — R42 Dizziness and giddiness: Secondary | ICD-10-CM | POA: Diagnosis not present

## 2023-04-12 DIAGNOSIS — H9193 Unspecified hearing loss, bilateral: Secondary | ICD-10-CM | POA: Diagnosis not present

## 2023-04-12 DIAGNOSIS — R040 Epistaxis: Secondary | ICD-10-CM | POA: Diagnosis not present

## 2023-04-12 NOTE — Progress Notes (Unsigned)
TeleHealth Visit:  This visit was completed with telemedicine (audio/video) technology. [name] [name] has verbally consented to this TeleHealth visit. The patient is located at home, the provider is located at home. The participants in this visit include the listed provider and patient. The visit was conducted today via MyChart video.  OBESITY [name] [name] is here to discuss her progress with her obesity treatment plan along with follow-up of her obesity related diagnoses.    Today's visit was # 79  Starting weight: 258 lbs Starting date: 05/07/2018 Weight reported at last virtual office visit: 189.6 lbs on 03/16/23 Today's reported weight (04/13/23):  178.8 lbs Total weight loss: 80 lbs Weight change since last visit: -11 lbs  Today's vital per pt: BP- 118/73 HR- 70  Nutrition Plan: the Category 2 plan and practicing portion control and making smarter food choices, such as increasing vegetables and decreasing simple carbohydrates   Current exercise:  Has not exercised for the past week due to epistaxis  Interim History:  Had a severe nosebleed 6 days ago.  She went to the emergency room and a nasal balloon was placed.  She had it removed yesterday by ENT.  Appetite was affected by the nasal balloon. She is down 11 pounds. She hardly ate at all while she had the nasal balloon.. She plans on capitalizing on the wt loss and adhering to plan well. She will start back to the gym next week. She will do some walking this week.    Assessment/Plan:  1. 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 Last A1c was 5.4 Medication(s): Mounjaro 15 mg SQ weekly  Lab Results  Component Value Date   HGBA1C 5.4 01/21/2023   HGBA1C 5.2 10/23/2021   HGBA1C 4.8 06/11/2021   Lab Results  Component Value Date   LDLCALC 34 01/21/2023   CREATININE 1.27 (H) 02/23/2023   No results found for: "GFR"  Plan: Continue and refill Mounjaro 15 mg  SQ weekly   2. Iron deficiency anemia Anemia is improving. Hemoglobin has increased from 9.4 on 01/21/2023  to 11.0 on 02/23/2023.  Iron supplementation: Iron 325 mg by mouth daily Lab Results  Component Value Date   WBC 6.5 02/23/2023   HGB 11.0 (L) 02/23/2023   HCT 35.1 02/23/2023   MCV 81 02/23/2023   PLT 258 02/23/2023   Lab Results  Component Value Date   IRON 68 06/11/2021   TIBC 299 06/11/2021   FERRITIN 19 01/21/2023     Plan: Continue supplementation at current dose  3. Hyperlipidemia associated with type 2 diabetes LDL is at goal. Hx of stroke. Medication(s): Crestor 20 mg daily   Lab Results  Component Value Date   CHOL 148 01/21/2023   HDL 103 01/21/2023   LDLCALC 34 01/21/2023   TRIG 51 01/21/2023   CHOLHDL 2.7 04/09/2016   CHOLHDL 4.1 01/31/2016   CHOLHDL 4.7 03/15/2010   Lab Results  Component Value Date   ALT 19 02/23/2023   AST 27 02/23/2023   ALKPHOS 73 02/23/2023   BILITOT 0.3 02/23/2023   The ASCVD Risk score (Arnett DK, et al., 2019) failed to calculate for the following reasons:   The patient has a prior MI or stroke diagnosis  Plan: Refill Crestor 20 mg daily.   4. Morbid Obesity: Current BMI 31  [name] [name] is currently in the action stage of change. As such, her goal is to continue with weight loss efforts.  She has agreed to  the Category 2 plan.  Exercise goals: resume exercise routine.  Behavioral modification strategies: increasing lean protein intake, decreasing simple carbohydrates , and planning for success.  [name] [name] has agreed to follow-up with our clinic in 2 weeks.  No orders of the defined types were placed in this encounter.   Medications Discontinued During This Encounter  Medication Reason   capsicum (ZOSTRIX) 0.075 % topical cream Patient Preference   rosuvastatin (CRESTOR) 20 MG tablet Reorder   tirzepatide (MOUNJARO) 15 MG/0.5ML Pen Reorder   rosuvastatin (CRESTOR) 20 MG tablet Reorder     Meds ordered this  encounter  Medications   tirzepatide (MOUNJARO) 15 MG/0.5ML Pen    Sig: Inject 15 mg into the skin once a week.    Dispense:  2 mL    Refill:  0    Order Specific Question:   Supervising Provider    Answer:   Glennis Brink [[id number]]   DISCONTD: rosuvastatin (CRESTOR) 20 MG tablet    Sig: Take 1 tablet (20 mg total) by mouth at bedtime.    Dispense:  90 tablet    Refill:  0    Order Specific Question:   Supervising Provider    Answer:   Glennis Brink [[id number]]   rosuvastatin (CRESTOR) 20 MG tablet    Sig: Take 1 tablet (20 mg total) by mouth at bedtime.    Dispense:  90 tablet    Refill:  0    Order Specific Question:   Supervising Provider    Answer:   Glennis Brink [[id number]]      Objective:   VITALS: Per patient if applicable, see vitals. GENERAL: Alert and in no acute distress. CARDIOPULMONARY: No increased WOB. Speaking in clear sentences.  PSYCH: Pleasant and cooperative. Speech normal rate and rhythm. Affect is appropriate. Insight and judgement are appropriate. Attention is focused, linear, and appropriate.  NEURO: Oriented as arrived to appointment on time with no prompting.   Attestation Statements:   Reviewed by clinician on day of visit: allergies, medications, problem list, medical history, surgical history, family history, social history, and previous encounter notes.  This was prepared with the assistance of [employer].  Occasional wrong-word or sound-a-like substitutions may have occurred due to the inherent limitations of voice recognition software.

## 2023-04-13 ENCOUNTER — Other Ambulatory Visit (HOSPITAL_BASED_OUTPATIENT_CLINIC_OR_DEPARTMENT_OTHER): Payer: Self-pay

## 2023-04-13 ENCOUNTER — Telehealth (INDEPENDENT_AMBULATORY_CARE_PROVIDER_SITE_OTHER): Payer: Medicare Other | Admitting: Family Medicine

## 2023-04-13 ENCOUNTER — Encounter (INDEPENDENT_AMBULATORY_CARE_PROVIDER_SITE_OTHER): Payer: Self-pay | Admitting: Family Medicine

## 2023-04-13 VITALS — Ht 63.0 in | Wt 178.0 lb

## 2023-04-13 DIAGNOSIS — N1831 Chronic kidney disease, stage 3a: Secondary | ICD-10-CM | POA: Diagnosis not present

## 2023-04-13 DIAGNOSIS — E1122 Type 2 diabetes mellitus with diabetic chronic kidney disease: Secondary | ICD-10-CM | POA: Diagnosis not present

## 2023-04-13 DIAGNOSIS — D509 Iron deficiency anemia, unspecified: Secondary | ICD-10-CM | POA: Diagnosis not present

## 2023-04-13 DIAGNOSIS — Z6831 Body mass index (BMI) 31.0-31.9, adult: Secondary | ICD-10-CM | POA: Diagnosis not present

## 2023-04-13 DIAGNOSIS — E785 Hyperlipidemia, unspecified: Secondary | ICD-10-CM

## 2023-04-13 DIAGNOSIS — E1169 Type 2 diabetes mellitus with other specified complication: Secondary | ICD-10-CM

## 2023-04-13 DIAGNOSIS — Z7985 Long-term (current) use of injectable non-insulin antidiabetic drugs: Secondary | ICD-10-CM | POA: Diagnosis not present

## 2023-04-13 MED ORDER — ROSUVASTATIN CALCIUM 20 MG PO TABS
20.0000 mg | ORAL_TABLET | Freq: Every day | ORAL | 0 refills | Status: DC
  Filled 2023-04-13: qty 90, 90d supply, fill #0

## 2023-04-13 MED ORDER — TIRZEPATIDE 15 MG/0.5ML ~~LOC~~ SOAJ
15.0000 mg | SUBCUTANEOUS | 0 refills | Status: DC
  Filled 2023-04-13: qty 2, 28d supply, fill #0

## 2023-04-13 MED ORDER — ROSUVASTATIN CALCIUM 20 MG PO TABS
20.0000 mg | ORAL_TABLET | Freq: Every day | ORAL | 0 refills | Status: AC
Start: 2023-04-13 — End: ?

## 2023-04-14 ENCOUNTER — Other Ambulatory Visit (HOSPITAL_BASED_OUTPATIENT_CLINIC_OR_DEPARTMENT_OTHER): Payer: Self-pay

## 2023-04-14 ENCOUNTER — Telehealth: Payer: Self-pay | Admitting: *Deleted

## 2023-04-14 DIAGNOSIS — E1142 Type 2 diabetes mellitus with diabetic polyneuropathy: Secondary | ICD-10-CM

## 2023-04-14 NOTE — Telephone Encounter (Signed)
Started PA for Qutenza 8% 2 patch kit Key: Rutherford Guys, MD  P [facility] Calls Can you please order qutenza to try on Takiah Maiden left foot for diabetic neuropathy peripheral left foot only

## 2023-04-14 NOTE — Telephone Encounter (Signed)
I have filled out the [employer] form and it is pending Dr [name] Mace signature.

## 2023-04-14 NOTE — Telephone Encounter (Signed)
PA has been completed online. Awaiting determination from [employer].

## 2023-04-15 NOTE — Telephone Encounter (Signed)
My [employer] Connect form and the PA approval has been faxed to [employer]. Received a receipt of confirmation.

## 2023-04-16 ENCOUNTER — Telehealth: Payer: Self-pay

## 2023-04-16 NOTE — Telephone Encounter (Signed)
Transition Care Management Unsuccessful Follow-up Telephone Call  Date of discharge and from where:  [facility]  Attempts:  1st Attempt  Reason for unsuccessful TCM follow-up call:  No answer/busy   Lenard Forth [facility] Guide, [facility] [phone] [address] [place], [place], [place] [address] Phone: [phone] Email: [email or web address]

## 2023-04-19 ENCOUNTER — Telehealth: Payer: Self-pay

## 2023-04-19 ENCOUNTER — Ambulatory Visit: Payer: Medicare Other | Admitting: Psychology

## 2023-04-19 ENCOUNTER — Ambulatory Visit (INDEPENDENT_AMBULATORY_CARE_PROVIDER_SITE_OTHER): Payer: Medicare Other | Admitting: Neurology

## 2023-04-19 ENCOUNTER — Ambulatory Visit (INDEPENDENT_AMBULATORY_CARE_PROVIDER_SITE_OTHER): Payer: Self-pay | Admitting: Neurology

## 2023-04-19 DIAGNOSIS — G629 Polyneuropathy, unspecified: Secondary | ICD-10-CM

## 2023-04-19 DIAGNOSIS — E1142 Type 2 diabetes mellitus with diabetic polyneuropathy: Secondary | ICD-10-CM

## 2023-04-19 DIAGNOSIS — Z7984 Long term (current) use of oral hypoglycemic drugs: Secondary | ICD-10-CM | POA: Diagnosis not present

## 2023-04-19 DIAGNOSIS — M62472 Contracture of muscle, left ankle and foot: Secondary | ICD-10-CM

## 2023-04-19 DIAGNOSIS — Z0289 Encounter for other administrative examinations: Secondary | ICD-10-CM

## 2023-04-19 DIAGNOSIS — G90519 Complex regional pain syndrome I of unspecified upper limb: Secondary | ICD-10-CM

## 2023-04-19 DIAGNOSIS — G5702 Lesion of sciatic nerve, left lower limb: Secondary | ICD-10-CM

## 2023-04-19 NOTE — Patient Instructions (Addendum)
Sympathetic nerve block - Dr. Lorrine Kin Continue with The Botox - double the dosage Qutenza   Complex regional pain syndrome (CRPS) is a disabling condition that's usually diagnosed by ruling out other conditions with similar symptoms. The International Association for the Study of Pain (IASP) has published the most widely accepted criteria for diagnosing CRPS: CRPS I: Continuing pain that's disproportionate to the inciting event, such as an injury or surgery Allodynia, or pain from a nonpainful stimulus Hyperalgesia Evidence of edema, changes in skin blood flow, or abnormal sudomotor activity in the area of pain Other criteria for diagnosing CRPS include: Reporting at least one symptom in all four of the following categories: Sensory: Hyperaesthesia and/or allodynia Vasomotor: Temperature asymmetry, skin color changes, and/or skin color asymmetry Sudomotor/oedema: Oedema, sweating changes, and/or sweating asymmetry Signs and symptoms must not be better explained by another diagnosis  CRPS can occur anywhere in the body, but it usually affects an arm, leg, hand, or foot. Treatment is most effective when started early.  Anesthetics injected into the affected limb can numb pain, while a sympathetic ganglion block can target nerves that contribute to swelling and pain. A spinal cord stimulator can mask pain by delivering a tingling sensation to the affected area. Dorsal root ganglion (DRG) stimulation can directly target chronic pain concentrated in specific areas, such as the foot.  A lumbar sympathetic nerve block is an injection of numbing medication into the lower back to temporarily relieve pain in the lower extremities. The injection targets the sympathetic nerves, a cluster of nerve cell bodies located along the front side of the spine. These nerves communicate pain from the legs and feet to the brain.   Complex Regional Pain Syndrome Complex regional pain syndrome (CRPS) is a nerve  disorder that is characterized by long-term (chronic) pain. The pain is usually in a hand, arm, foot, or leg. CRPS usually occurs after an injury or trauma, such as a fracture or sprain. There are two types of CRPS: Type 1. This type occurs after an injury with no known damage to a nerve. Type 2. This type occurs after an injury that damages a nerve. There are three stages of the condition: Stage 1. This stage, called the acute stage, may last for up to 3 months. Stage 2. This stage, called the dystrophic stage, may last for 3-12 months. Stage 3. This stage, called the atrophic stage, may start after one year. CRPS ranges from mild to severe. For most people, CRPS is mild and recovery happens over time. For others, CRPS lasts for a very long time and makes it hard to do everyday tasks. What are the causes? The exact cause of this condition is not known. It is usually triggered by an injury. What increases the risk? You are more likely to develop this condition if: You are female. You have any of the following: A wrist fracture that involves a lower arm bone (distal radius fracture). Ankle dislocation or fracture. A long surgery time. Possible nerve injury during surgery. What are the signs or symptoms? Signs and symptoms in the affected hand, arm, foot, or leg are different for each stage. Signs and symptoms of stage 1 include: Spontaneous pain that feels like a burning or prickling, tingling feeling (pins and needles sensation). Extremely sensitive skin. Swelling. Joint stiffness. Warmth and redness. Excessive sweating. Hair and nail growth that is faster than normal. Signs and symptoms of stage 2 include: Spreading of pain to the whole arm or leg. Increased skin sensitivity. Increased  swelling and stiffness. Coolness of the skin. Blue discoloration of skin. Loss of skin wrinkles. Brittle fingernails. Signs and symptoms of stage 3 include: Pain that spreads to other areas of  the body but becomes less severe. More stiffness, leading to loss of motion. Skin that is pale, dry, shiny, or tightly stretched. How is this diagnosed? This condition may be diagnosed based on: Your signs and symptoms. A physical exam. There is no test to diagnose CRPS, but you may have tests: To check for bone changes that might indicate CRPS. These tests may include an MRI or bone scan. To rule out other possible causes of your symptoms. How is this treated? Early treatment may prevent CRPS from advancing past stage 1. There is not one treatment that works for everyone. Treatment options may include: Medicines, which may include: NSAIDs, such as ibuprofen. Steroids. Blood pressure drugs. Antidepressants. Anti-seizure drugs. Pain relievers. Exercise. Occupational therapy and physical therapy. Biofeedback. Mental health counseling. Numbing injections. Spinal surgery to implant a spinal cord stimulator or a pain pump. Follow these instructions at home: Medicines Take over-the-counter and prescription medicines only as told by your health care provider. Ask your health care provider if the medicine prescribed to you: Requires you to avoid driving or using machinery. Can cause constipation. You may need to take these actions to prevent or treat constipation: Drink enough fluid to keep your urine pale yellow. Take over-the-counter or prescription medicines. Eat foods that are high in fiber, such as beans, whole grains, and fresh fruits and vegetables. Limit foods that are high in fat and processed sugars, such as fried or sweet foods. General instructions Do not use any products that contain nicotine or tobacco. These products include cigarettes, chewing tobacco, and vaping devices, such as e-cigarettes. If you need help quitting, ask your health care provider. Maintain a healthy weight. Return to your normal activities as told by your health care provider. Ask your health care  provider what activities are safe for you. Do exercises as told by your health care provider. Keep all follow-up visits. This is important. Where to find more information [employer] of Neurological Disorders and Stroke: [employer] Contact a health care provider if: Your symptoms change. Your symptoms get worse. You develop anxiety or depression. Get help right away if: Your pain is making you want to harm yourself. Get help right away if you feel like you may hurt yourself or others, or have thoughts about taking your own life. Go to your nearest emergency room or: Call 911. Call the National Suicide Prevention Lifeline at (615) 236-1942 or 988. This is open 24 hours a day. Text the Crisis Text Line at (212) 412-5684. Summary Complex regional pain syndrome (CRPS) is a nerve disorder that causes long-term (chronic) pain, usually in a hand, arm, leg, or foot. CRPS usually occurs after an injury or trauma, such as a fracture or sprain. CRPS ranges from mild to severe. Early treatment may prevent CRPS from advancing to more severe stages. This information is not intended to replace advice given to you by your health care provider. Make sure you discuss any questions you have with your health care provider. Document Revised: 05/07/2021 Document Reviewed: 05/07/2021 Elsevier Patient Education  2024 [employer].

## 2023-04-19 NOTE — Telephone Encounter (Signed)
Transition Care Management Follow-up Telephone Call Date of discharge and from where: [name] How have you been since you were released from the hospital? Doing very good Any questions or concerns? No  Items Reviewed: Did the pt receive and understand the discharge instructions provided? Yes  Medications obtained and verified? Yes  Other? No  Any new allergies since your discharge? No  Dietary orders reviewed? No Do you have support at home? Yes    Follow up appointments reviewed:  PCP Hospital f/u appt confirmed? No  Scheduled to see  on  @  Specialist Hospital f/u appt confirmed? Yes  Scheduled to see  on  @ . Are transportation arrangements needed? No  If their condition worsens, is the pt aware to call PCP or go to the Emergency Dept.? Yes Was the patient provided with contact information for the PCP's office or ED? Yes Was to pt encouraged to call back with questions or concerns? Yes

## 2023-04-19 NOTE — Progress Notes (Unsigned)
Complex regional pain syndrome (CRPS) is a chronic condition that causes pain that's greater than expected from the injury that caused it. There's no known cure, but a medical professional can manage symptoms to improve quality of life. Treatments for CRPS in the foot include:  Physical therapy: Gentle, guided exercises can help improve range of motion and strength, and prevent muscle loss. Occupational therapy can help improve skills needed for daily activities. Topical treatments: Capsaicin cream, lidocaine cream, or patches can reduce hypersensitivity. Anesthetics: Injected into the affected limb to numb pain. Sympathetic ganglion block: Injected to target nerves that can contribute to swelling and pain. Spinal cord stimulator: Masks pain by delivering a tingling sensation to the affected area.  Xarelto - review the chart but I think she can go to aspirin Reorder a large panel of neuropathy labs

## 2023-04-20 NOTE — Progress Notes (Unsigned)
Full Name: [name] [name] Gender: Female MRN #: [id number] Date of Birth: 19-Feb-1951    Visit Date: 04/19/2023 16:19 Age: 72 Years Examining Physician: Dr. Lucia Gaskins Referring Physician: Dr. 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 The right EDB motor conduction showed reduced amplitude (1.3 mV, normal greater than 2) and decreased conduction velocity (fib head to ankle, 6m/s, normal greater than 44). The left tibial motor conduction showed reduced amplitude (1.6 mV, normal greater than 4).  The right radial sensory conduction was within normal limits.  The bilateral sural and superficial peroneal sensory conductions showed no response.All remaining nerves (as indicated in the following tables) were within normal limits.   EMG was performed in the left lower extremity. All muscles (as indicated in the following tables) were within normal limits.        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  No suggestion of lumbar radiculopathy. EMG/NCS similar to testing from 2017  ------------------------------- Naomie Dean, M.D.  [facility] [address] [place], [place] [address] Tel: [phone] Fax: [phone]  Verbal informed consent was obtained from the patient, patient was informed of potential risk of procedure, including bruising, bleeding, hematoma formation, infection, muscle weakness, muscle pain, numbness, among others.    MNC    Nerve / Sites Muscle Latency Ref. Amplitude Ref. Rel Amp  Segments Distance Velocity Ref. Area    ms ms mV mV %  cm m/s m/s mVms  L Peroneal - EDB     Ankle EDB 6.5 ?6.5 1.1 ?2.0 100 Ankle - EDB 9   4.2     Fib head EDB 13.5  0.9  86.8 Fib head - Ankle 27 39 ?44 3.9     Pop fossa EDB 15.4  1.5  164 Pop fossa - Fib head 7 38 ?44 7.5         Pop fossa - Ankle      R Peroneal - EDB     Ankle EDB 5.8 ?6.5 1.3 ?2.0 100 Ankle - EDB 9   5.3     Fib head EDB 14.3  1.3  95 Fib head - Ankle 27 32 ?44 5.3     Pop fossa EDB 13.0  1.9  151 Pop fossa - Fib head 9 70 ?44 6.7         Pop fossa - Ankle      L Tibial - AH     Ankle AH 4.6 ?5.8 1.6 ?4.0 100 Ankle - AH 9   6.4     Pop fossa AH 13.5  1.8  113 Pop fossa - Ankle 37 41 ?41 8.8  R Tibial - AH     Ankle AH 4.5 ?5.8 4.2 ?4.0 100 Ankle - AH 9   11.4     Pop fossa AH 14.2  2.8  65.7 Pop fossa - Ankle 38 41 ?41 7.4             SNC    Nerve / Sites Rec. Site Peak Lat Ref.  Amp Ref. Segments Distance  ms ms V V  cm  R Radial - Anatomical snuff box (Forearm)     Forearm Wrist 2.9 ?2.9 16 ?15 Forearm - Wrist 10  L Sural - Ankle (Calf)     Calf Ankle NR ?4.4 NR ?6 Calf - Ankle 14  R Sural - Ankle (Calf)     Calf Ankle NR ?4.4 NR ?6 Calf - Ankle 14  L Superficial peroneal - Ankle     Lat leg Ankle NR ?4.4 NR ?6 Lat leg - Ankle 14  R Superficial peroneal - Ankle     Lat leg Ankle NR ?4.4 NR ?6 Lat leg - Ankle 14               F  Wave    Nerve F Lat Ref.   ms ms  L Tibial - AH 32.9 ?56.0  R Tibial - AH 43.7 ?56.0         EMG Summary Table    Spontaneous MUAP Recruitment  Muscle IA Fib PSW Fasc Other Amp Dur. Poly Pattern  L. Vastus medialis Normal None None None _______ Normal Normal Normal Normal  L. Tibialis anterior Normal None None None _______ Normal Normal Normal Normal  L. Gastrocnemius (Medial head) Normal None None None _______ Normal Normal Normal Normal  L. Extensor hallucis longus Normal None None None _______ Normal Normal Normal Normal  L. Abductor hallucis Normal None None None  _______ Normal Normal Normal Normal  L. Biceps femoris (long head) Normal None None None _______ Normal Normal Normal Normal  L. Gluteus maximus Normal None None None _______ Normal Normal Normal Normal  L. Gluteus medius Normal None None None _______ Normal Normal Normal Normal  L. Lumbar paraspinals (low) Normal None None None _______ Normal Normal Normal Normal  R. Lumbar paraspinals (low) Normal None None None _______ Normal Normal Normal Normal

## 2023-04-20 NOTE — Telephone Encounter (Signed)
Spoke with Vernell @ [employer]. She has transferred the prescription from the program to [facility]. She stated the Rx will take about 3 business days to process then the pharmacy will be in touch with the patient for consent, and then contact our office for shipment scheduling.   I have updated the patient and offered an appt for Monday 8/19 at 1:00 PM (place on Dr [name] Mace schedule as office visit).

## 2023-04-21 ENCOUNTER — Telehealth: Payer: Self-pay | Admitting: Neurology

## 2023-04-21 ENCOUNTER — Ambulatory Visit (INDEPENDENT_AMBULATORY_CARE_PROVIDER_SITE_OTHER): Payer: Medicare Other | Admitting: Psychology

## 2023-04-21 ENCOUNTER — Other Ambulatory Visit: Payer: Self-pay | Admitting: Neurology

## 2023-04-21 DIAGNOSIS — G6289 Other specified polyneuropathies: Secondary | ICD-10-CM

## 2023-04-21 DIAGNOSIS — F3341 Major depressive disorder, recurrent, in partial remission: Secondary | ICD-10-CM | POA: Diagnosis not present

## 2023-04-21 NOTE — Telephone Encounter (Signed)
Referral for pain clinic fax to [facility]. Phone: [phone], Fax: [phone]. 

## 2023-04-21 NOTE — Progress Notes (Signed)
[facility] Counselor/Therapist Progress Note  Patient ID: [name] [name], MRN: [id number],    Date: 04/21/2023  Time Spent: 9:00am-9:30am   Treatment Type: Individual Therapy  pt is seen for a virtual video visit via caregility. Pt consent to telehealth visit and acknowledges limitations of telehealth visits. Pt joins from her home, reporting privacy, and counselor from her home office.  Reported Symptoms:  pt reports mood stable- no irritability.  Pt reports coping w/ frustrations and improved interactions w/ mom.    Mental Status Exam: Appearance:  Well Groomed     Behavior: Appropriate  Motor: Normal  Speech/Language:  Normal Rate  Affect: Appropriate  Mood: normal  Thought process: normal  Thought content:   WNL  Sensory/Perceptual disturbances:   WNL  Orientation: oriented to person, place, time/date, and situation  Attention: Good  Concentration: Good  Memory: WNL  Fund of knowledge:  Good  Insight:   Good  Judgment:  Good  Impulse Control: Good   Risk Assessment: Danger to Self:  No Self-injurious Behavior: No Danger to Others: No Duty to Warn:no Physical Aggression / Violence:No  Access to Firearms a concern: No  Gang Involvement:No   Subjective: Counselor assessed pt current functioning per pt report. Processed w/pt her mood, positives and stressors.  Reflected continued progress w/ mood and coping w/ stressors.  Explored w/ pt interactions w/ mom and friends. Discussed continued self care and assertive communication.   Pt affect bright.  Pt reports her mood is good- no irritability or overwhelmed w/ things.  Pt reports that interactions w/ her mom have been good and coping well w/ stressors and mom.    Pt feels she is doing well and has gained from support of counseling.  Pt reports improvements towards her plan w/ weight loss. Pt would like to pause followup at this time.  Pt discussed awareness of indicators for returning to counseling and  needing support.       Interventions: Cognitive Behavioral Therapy and supportive  Diagnosis:Major depressive disorder, recurrent episode, in partial remission (HCC)  Plan: pt is currently meeting goals and would like to pause counseling.  Pt to return for counseling as needed in future.  Pt to f/u as scheduled w/ her PCP and healthy weight and wellness.   Individualized Treatment Plan Strengths: enjoys travel, enjoys reading, attends gym 4-5 times a week.  Supports: friends.  Travel buddy.   Goal/Needs for Treatment:  In order of importance to patient 1) cope w/ unhappy feelings w/out eating 2) cope w/ stressors 3) --   Client Statement of Needs: "get past these blockages.  If I can make headway on my stressors and Get a handle on this weight I would feel happy.   the more unhappy I am the more I eat.  I want A healhty balance."    Treatment Level: outpatient counseling .    Symptoms:unhappy, stressed, overwhelmed, emotional eating  Client Treatment Preferences:every 1-2 weeks cousneling.  Continue w/ healthy weight and wellness and f/u w/ PCP.   Healthcare consumer's goal for treatment:  Counselor, Forde Radon, [facility] will support the patient's ability to achieve the goals identified. Cognitive Behavioral Therapy, Assertive Communication/Conflict Resolution Training, Relaxation Training, ACT, Humanistic and other evidenced-based practices will be used to promote progress towards healthy functioning.   Healthcare consumer will: Actively participate in therapy, working towards healthy functioning.    *Justification for Continuation/Discontinuation of Goal: R=Revised, O=Ongoing, A=Achieved, D=Discontinued  Goal 1) Decrease depressed moods and emotional eating  AEB pt report.  Baseline date 12/22/22: Progress towards goal 0; How Often - Daily Target Date Goal Was reviewed Status Code Progress towards goal/Likert rating  12/22/23                Goal 2) Increased effective coping w/  stressors AEB pt report of daily self care, utilizing coping skills and reduced anxiety/depression.   Baseline date 12/22/22: Progress towards goal 0; How Often - Daily Target Date Goal Was reviewed Status Code Progress towards goal  12/22/23                 This plan has been reviewed and created by the following participants:  This plan will be reviewed at least every 12 months. Date Behavioral Health Clinician Date Guardian/Patient   12/22/22  [facility] 12/22/22 Verbal Consent Provided                         Forde Radon [facility]

## 2023-04-23 ENCOUNTER — Encounter: Payer: Self-pay | Admitting: Podiatry

## 2023-04-23 ENCOUNTER — Ambulatory Visit (INDEPENDENT_AMBULATORY_CARE_PROVIDER_SITE_OTHER): Payer: Medicare Other | Admitting: Podiatry

## 2023-04-23 DIAGNOSIS — D689 Coagulation defect, unspecified: Secondary | ICD-10-CM

## 2023-04-23 DIAGNOSIS — Q828 Other specified congenital malformations of skin: Secondary | ICD-10-CM

## 2023-04-23 DIAGNOSIS — L84 Corns and callosities: Secondary | ICD-10-CM | POA: Diagnosis not present

## 2023-04-23 DIAGNOSIS — E1142 Type 2 diabetes mellitus with diabetic polyneuropathy: Secondary | ICD-10-CM

## 2023-04-23 MED ORDER — UREA 10 % EX CREA: TOPICAL_CREAM | CUTANEOUS | 0 refills | Status: DC | PRN

## 2023-04-23 NOTE — Progress Notes (Signed)
  Subjective:  Patient ID: [name] [name], female    DOB: [birth year],  MRN: [id number]  Chief Complaint  Patient presents with   Callouses    "The callus under my little toe on the ride foot has built back up.  There may be a little on the other foot."    72 y.o. female presents with the above complaint. History confirmed with patient.  Patient presents today for painful callus present primarily at the plantar aspect of the bilateral fifth metatarsal head.  She has had this callus for months to years.  It is slowly regrown. Pt does have history of DM with neuropathy.   Objective:  Physical Exam: warm, good capillary refill, nail exam normal nails without lesions, no trophic changes or ulcerative lesions. DP pulses palpable, PT pulses palpable, and protective sensation intact Left Foot: tenderness of the 5th metatarsal head mild to moderate a little hyperkeratotic tissue underlying the fifth metatarsal head. Right Foot: tenderness of the 5th metatarsal head mild to moderate buildup of hyperkeratotic tissue underlying the fifth metatarsal head.  Mild callus formation at the medial aspect of the hallux IPJ.  No images are attached to the encounter.  Assessment:   1. Corns and callosities   2. Blood clotting disorder (HCC)   3. Porokeratosis   4. DM type 2 with diabetic peripheral neuropathy (HCC)      Plan:  Patient was evaluated and treated and all questions answered.  Hyperkeratotic lesion/callus of foot right and left.  2 separate lesions debrided All symptomatic hyperkeratoses were safely debrided with a sterile #15 blade to patient's level of comfort without incident. We discussed preventative and palliative care of these lesions including supportive and accommodative shoegear, padding, prefabricated and custom molded accommodative orthoses, use of a pumice stone and lotions/creams daily.   Return if symptoms worsen or fail to improve.

## 2023-04-26 NOTE — Procedures (Signed)
Full Name: [name] [name] Gender: Female MRN #: [id number] Date of Birth: 1951-09-03    Visit Date: 04/19/2023 16:19 Age: 72 Years Examining Physician: Dr. Lucia Gaskins Referring Physician: Dr. 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 The right EDB motor conduction showed reduced amplitude (1.3 mV, normal greater than 2) and decreased conduction velocity (fib head to ankle, 37m/s, normal greater than 44). The left tibial motor conduction showed reduced amplitude (1.6 mV, normal greater than 4).  The right radial sensory conduction was within normal limits.  The bilateral sural and superficial peroneal sensory conductions showed no response.All remaining nerves (as indicated in the following tables) were within normal limits.   EMG was performed in the left lower extremity. All muscles (as indicated in the following tables) were within normal limits.        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  No suggestion of lumbar radiculopathy. EMG/NCS similar to testing from 2017  ------------------------------- Naomie Dean, M.D.  [facility] [address], [address] [place], [place] [address] Tel: [phone] Fax: [phone]  Verbal informed consent was obtained from the patient, patient was informed of potential risk of procedure, including bruising, bleeding, hematoma formation, infection, muscle weakness, muscle pain, numbness, among others.    MNC    Nerve / Sites Muscle Latency Ref. Amplitude Ref. Rel Amp  Segments Distance Velocity Ref. Area    ms ms mV mV %  cm m/s m/s mVms  L Peroneal - EDB     Ankle EDB 6.5 ?6.5 1.1 ?2.0 100 Ankle - EDB 9   4.2     Fib head EDB 13.5  0.9  86.8 Fib head - Ankle 27 39 ?44 3.9     Pop fossa EDB 15.4  1.5  164 Pop fossa - Fib head 7 38 ?44 7.5         Pop fossa - Ankle      R Peroneal - EDB     Ankle EDB 5.8 ?6.5 1.3 ?2.0 100 Ankle - EDB 9   5.3     Fib head EDB 14.3  1.3  95 Fib head - Ankle 27 32 ?44 5.3     Pop fossa EDB 13.0  1.9  151 Pop fossa - Fib head 9 70 ?44 6.7         Pop fossa - Ankle      L Tibial - AH     Ankle AH 4.6 ?5.8 1.6 ?4.0 100 Ankle - AH 9   6.4     Pop fossa AH 13.5  1.8  113 Pop fossa - Ankle 37 41 ?41 8.8  R Tibial - AH     Ankle AH 4.5 ?5.8 4.2 ?4.0 100 Ankle - AH 9   11.4     Pop fossa AH 14.2  2.8  65.7 Pop fossa - Ankle 38 41 ?41 7.4             SNC    Nerve / Sites Rec. Site Peak Lat Ref.  Amp Ref. Segments Distance  ms ms V V  cm  R Radial - Anatomical snuff box (Forearm)     Forearm Wrist 2.9 ?2.9 16 ?15 Forearm - Wrist 10  L Sural - Ankle (Calf)     Calf Ankle NR ?4.4 NR ?6 Calf - Ankle 14  R Sural - Ankle (Calf)     Calf Ankle NR ?4.4 NR ?6 Calf - Ankle 14  L Superficial peroneal - Ankle     Lat leg Ankle NR ?4.4 NR ?6 Lat leg - Ankle 14  R Superficial peroneal - Ankle     Lat leg Ankle NR ?4.4 NR ?6 Lat leg - Ankle 14               F  Wave    Nerve F Lat Ref.   ms ms  L Tibial - AH 32.9 ?56.0  R Tibial - AH 43.7 ?56.0         EMG Summary Table    Spontaneous MUAP Recruitment  Muscle IA Fib PSW Fasc Other Amp Dur. Poly Pattern  L. Vastus medialis Normal None None None _______ Normal Normal Normal Normal  L. Tibialis anterior Normal None None None _______ Normal Normal Normal Normal  L. Gastrocnemius (Medial head) Normal None None None _______ Normal Normal Normal Normal  L. Extensor hallucis longus Normal None None None _______ Normal Normal Normal Normal  L. Abductor hallucis Normal None None None  _______ Normal Normal Normal Normal  L. Biceps femoris (long head) Normal None None None _______ Normal Normal Normal Normal  L. Gluteus maximus Normal None None None _______ Normal Normal Normal Normal  L. Gluteus medius Normal None None None _______ Normal Normal Normal Normal  L. Lumbar paraspinals (low) Normal None None None _______ Normal Normal Normal Normal  R. Lumbar paraspinals (low) Normal None None None _______ Normal Normal Normal Normal

## 2023-04-26 NOTE — Progress Notes (Unsigned)
TeleHealth Visit:  This visit was completed with telemedicine (audio/video) technology. [name] [name] has verbally consented to this TeleHealth visit. The patient is located at home, the provider is located at home. The participants in this visit include the listed provider and patient. The visit was conducted today via MyChart video.  OBESITY [name] [name] is here to discuss her progress with her obesity treatment plan along with follow-up of her obesity related diagnoses.    Today's visit was # 80  Starting weight: 258 lbs Starting date: 05/07/2018 Weight reported at last virtual office visit: 178.8 lbs on 04/13/23 Today's reported weight (04/27/23):  181.6 lbs Total weight loss: 77 lbs Weight change since last visit: +3 lbs  Today's vitals per pt: BP- 104/74 HR- 73   Nutrition Plan: practicing portion control and making smarter food choices, such as increasing vegetables and decreasing simple carbohydrates   Current exercise:  gym (30 cardio/bike/elliptical and 15-20 minutes weights) 3 days per week.   Interim History:  She has recently had a tooth pulled and gotten braces so her eating has been affected. She is up a few lbs. She hopes she can get back to cat 2 plan soon. Exercising less than usual to prevent nosebleeds. Protein intake is low. She is working at the protein. Weight goal is 150-155 lbs.  Assessment/Plan:  1. Epistaxis She has had some mild nosebleeds in the left nostril. The right nostril is the one she recently had to have a nasal balloon inserted. Extreme heat seems to cause it.  Plan: Make appointment with ENT if nosebleeds continue.  2. Type 2 Diabetes Mellitus with other specified complication, without long-term current use of insulin HgbA1c is not at goal. Last A1c was 5.4 Medication(s): Mounjaro 15 mg SQ weekly She has noticed more appetite suppression with 15 mg dose. She notes increased satiety.   Lab Results  Component Value Date   HGBA1C 5.4  01/21/2023   HGBA1C 5.2 10/23/2021   HGBA1C 4.8 06/11/2021   Lab Results  Component Value Date   LDLCALC 34 01/21/2023   CREATININE 1.27 (H) 02/23/2023   No results found for: "GFR"  Plan: Continue and refill Mounjaro 15 mg SQ weekly  3. Morbid Obesity: Current BMI 32  [name] [name] is currently in the action stage of change. As such, her goal is to continue with weight loss efforts.  She has agreed to the Category 2 plan.  Exercise goals:  as is  Behavioral modification strategies: increasing lean protein intake and decreasing simple carbohydrates .  [name] [name] has agreed to follow-up with our clinic in 4 weeks.  No orders of the defined types were placed in this encounter.   Medications Discontinued During This Encounter  Medication Reason   tirzepatide [facility]) 15 MG/0.5ML Pen Reorder   tirzepatide [facility]) 15 MG/0.5ML Pen Reorder     Meds ordered this encounter  Medications   DISCONTD: tirzepatide (MOUNJARO) 15 MG/0.5ML Pen    Sig: Inject 15 mg into the skin once a week.    Dispense:  6 mL    Refill:  0    Order Specific Question:   Supervising Provider    Answer:   Glennis Brink [[id number]]   tirzepatide (MOUNJARO) 15 MG/0.5ML Pen    Sig: Inject 15 mg into the skin once a week.    Dispense:  6 mL    Refill:  0    Order Specific Question:   Supervising Provider    Answer:   Carolin Sicks  Objective:   VITALS: Per patient if applicable, see vitals. GENERAL: Alert and in no acute distress. CARDIOPULMONARY: No increased WOB. Speaking in clear sentences.  PSYCH: Pleasant and cooperative. Speech normal rate and rhythm. Affect is appropriate. Insight and judgement are appropriate. Attention is focused, linear, and appropriate.  NEURO: Oriented as arrived to appointment on time with no prompting.   Attestation Statements:   Reviewed by clinician on day of visit: allergies, medications, problem list, medical history, surgical history, family history, social  history, and previous encounter notes.  This was prepared with the assistance of [employer].  Occasional wrong-word or sound-a-like substitutions may have occurred due to the inherent limitations of voice recognition software.

## 2023-04-27 ENCOUNTER — Telehealth (INDEPENDENT_AMBULATORY_CARE_PROVIDER_SITE_OTHER): Payer: Medicare Other | Admitting: Family Medicine

## 2023-04-27 ENCOUNTER — Other Ambulatory Visit (HOSPITAL_BASED_OUTPATIENT_CLINIC_OR_DEPARTMENT_OTHER): Payer: Self-pay

## 2023-04-27 ENCOUNTER — Encounter (INDEPENDENT_AMBULATORY_CARE_PROVIDER_SITE_OTHER): Payer: Self-pay | Admitting: Family Medicine

## 2023-04-27 VITALS — Ht 63.0 in | Wt 181.0 lb

## 2023-04-27 DIAGNOSIS — E1122 Type 2 diabetes mellitus with diabetic chronic kidney disease: Secondary | ICD-10-CM | POA: Diagnosis not present

## 2023-04-27 DIAGNOSIS — Z7985 Long-term (current) use of injectable non-insulin antidiabetic drugs: Secondary | ICD-10-CM

## 2023-04-27 DIAGNOSIS — R040 Epistaxis: Secondary | ICD-10-CM | POA: Diagnosis not present

## 2023-04-27 DIAGNOSIS — Z6832 Body mass index (BMI) 32.0-32.9, adult: Secondary | ICD-10-CM

## 2023-04-27 DIAGNOSIS — N1831 Chronic kidney disease, stage 3a: Secondary | ICD-10-CM

## 2023-04-27 MED ORDER — TIRZEPATIDE 15 MG/0.5ML ~~LOC~~ SOAJ
15.0000 mg | SUBCUTANEOUS | 0 refills | Status: DC
Start: 2023-04-27 — End: 2023-04-27

## 2023-04-27 MED ORDER — TIRZEPATIDE 15 MG/0.5ML ~~LOC~~ SOAJ
15.0000 mg | SUBCUTANEOUS | 0 refills | Status: DC
Start: 2023-04-27 — End: 2023-05-11
  Filled 2023-04-27: qty 2, 28d supply, fill #0

## 2023-04-27 NOTE — Addendum Note (Signed)
Addended by: Jesse Sans on: 04/27/2023 09:11 AM   Modules accepted: Orders

## 2023-04-27 NOTE — Addendum Note (Signed)
Addended by: Jesse Sans on: 04/27/2023 09:30 AM   Modules accepted: Orders

## 2023-04-28 ENCOUNTER — Other Ambulatory Visit (HOSPITAL_BASED_OUTPATIENT_CLINIC_OR_DEPARTMENT_OTHER): Payer: Self-pay

## 2023-05-03 DIAGNOSIS — E119 Type 2 diabetes mellitus without complications: Secondary | ICD-10-CM | POA: Diagnosis not present

## 2023-05-03 DIAGNOSIS — H35373 Puckering of macula, bilateral: Secondary | ICD-10-CM | POA: Diagnosis not present

## 2023-05-03 DIAGNOSIS — H353132 Nonexudative age-related macular degeneration, bilateral, intermediate dry stage: Secondary | ICD-10-CM | POA: Diagnosis not present

## 2023-05-10 ENCOUNTER — Ambulatory Visit: Payer: Medicare Other | Admitting: Neurology

## 2023-05-10 ENCOUNTER — Telehealth: Payer: Self-pay | Admitting: Neurology

## 2023-05-10 NOTE — Telephone Encounter (Signed)
The patches have not arrived from the pharmacy yet. I will call the pharmacy today to see what the delay is. Can you call the patient and cancel today's appt until we know when the patches are coming? Tell her we are so sorry about that. We might be able to do 8/29 but will be in touch again once we have talked to the pharmacy.

## 2023-05-10 NOTE — Telephone Encounter (Signed)
Michelle @ [facility]  called to schedule delivery of pt's medication: Qutenza 8% topical system, quantity of 1, 4 pack  84 day supply delivery date Wed 8-21 no signature required this is FYI no call back requested

## 2023-05-10 NOTE — Telephone Encounter (Signed)
Relayed message to patient. Appointment for today has been cancelled and I informed pt we will call back to reschedule once we receive patches

## 2023-05-10 NOTE — Telephone Encounter (Signed)
Awesome, thank you 

## 2023-05-10 NOTE — Progress Notes (Unsigned)
TeleHealth Visit:  This visit was completed with telemedicine (audio/video) technology. [name] [name] has verbally consented to this TeleHealth visit. The patient is located at home, the provider is located at home. The participants in this visit include the listed provider and patient. The visit was conducted today via MyChart video.  OBESITY [name] [name] is here to discuss her progress with her obesity treatment plan along with follow-up of her obesity related diagnoses.    Today's visit was # 81  Starting weight: 258 lbs Starting date: 05/07/2018 Weight reported at last virtual office visit: 181.6 lbs on 04/27/23 Today's reported weight (05/11/23):  182.2 lbs Total weight loss: 76 lbs Weight change since last visit: +1 lbs  Today's vitals per pt: BP- 108/69 HR- 74  Nutrition Plan: the Category 2 plan - 40% adherence.  Current exercise:  gym (30 cardio/bike/elliptical and 15-20 minutes weights) 5 days per week.   Interim History:  She is having pain and problems with bite from her new braces which has affected her eating. She has eaten more carbohydrates lately. Pretzels are easy to eat and she is eating too many of them. Polyphagia is much better with the [facility].  Weight goal 150-155.  Eating all of the prescribed protein: no Skipping meals: No Drinking adequate water: Yes Drinking sugar sweetened beverages: Yes Hunger controlled: moderately controlled. Cravings controlled:  moderately controlled.  Assessment/Plan:  1. 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 Last A1c was 5.4. Medication(s): Mounjaro 15 mg SQ weekly  Lab Results  Component Value Date   HGBA1C 5.4 01/21/2023   HGBA1C 5.2 10/23/2021   HGBA1C 4.8 06/11/2021   Lab Results  Component Value Date   LDLCALC 34 01/21/2023   CREATININE 1.27 (H) 02/23/2023   No results found for: "GFR"  Plan: Continue and refill Mounjaro 15 mg SQ  weekly  2. Iron deficiency anemia Hemoglobin was 9.4 when we checked it in May.  We started her on iron and it improved to 11.0 in June.  Fatigue is improved. Had a colonoscopy 2 years ago.  They found 1 polyp and she is due another colonoscopy in 5 years. Iron supplementation: Iron 325 mg by mouth daily Lab Results  Component Value Date   WBC 6.5 02/23/2023   HGB 11.0 (L) 02/23/2023   HCT 35.1 02/23/2023   MCV 81 02/23/2023   PLT 258 02/23/2023   Lab Results  Component Value Date   IRON 68 06/11/2021   TIBC 299 06/11/2021   FERRITIN 19 01/21/2023     Plan: Continue supplementation at current dose Recheck hemoglobin and iron in 2 months.   3. Morbid Obesity: Current BMI 32  [name] is currently in the action stage of change. As such, her goal is to continue with weight loss efforts.  She has agreed to the Category 2 plan.  1. Have one protein shake daily.  Exercise goals:  as is  Behavioral modification strategies: increasing lean protein intake, decreasing simple carbohydrates , and planning for success.  [name] has agreed to follow-up with our clinic in 2 weeks.  No orders of the defined types were placed in this encounter.   Medications Discontinued During This Encounter  Medication Reason   tirzepatide [facility]) 15 MG/0.5ML Pen Reorder     Meds ordered this encounter  Medications   tirzepatide (MOUNJARO) 15 MG/0.5ML Pen    Sig: Inject 15 mg into the skin once a week.    Dispense:  6 mL    Refill:  0    Order Specific Question:   Supervising Provider    Answer:   Glennis Brink [[id number]]      Objective:   VITALS: Per patient if applicable, see vitals. GENERAL: Alert and in no acute distress. CARDIOPULMONARY: No increased WOB. Speaking in clear sentences.  PSYCH: Pleasant and cooperative. Speech normal rate and rhythm. Affect is appropriate. Insight and judgement are appropriate. Attention is focused, linear, and appropriate.  NEURO: Oriented as arrived  to appointment on time with no prompting.   Attestation Statements:   Reviewed by clinician on day of visit: allergies, medications, problem list, medical history, surgical history, family history, social history, and previous encounter notes.   This was prepared with the assistance of [employer].  Occasional wrong-word or sound-a-like substitutions may have occurred due to the inherent limitations of voice recognition software.

## 2023-05-11 ENCOUNTER — Other Ambulatory Visit (HOSPITAL_BASED_OUTPATIENT_CLINIC_OR_DEPARTMENT_OTHER): Payer: Self-pay

## 2023-05-11 ENCOUNTER — Encounter (INDEPENDENT_AMBULATORY_CARE_PROVIDER_SITE_OTHER): Payer: Self-pay | Admitting: Family Medicine

## 2023-05-11 ENCOUNTER — Telehealth (INDEPENDENT_AMBULATORY_CARE_PROVIDER_SITE_OTHER): Payer: Medicare Other | Admitting: Family Medicine

## 2023-05-11 VITALS — Ht 63.0 in | Wt 182.0 lb

## 2023-05-11 DIAGNOSIS — D509 Iron deficiency anemia, unspecified: Secondary | ICD-10-CM

## 2023-05-11 DIAGNOSIS — Z6832 Body mass index (BMI) 32.0-32.9, adult: Secondary | ICD-10-CM

## 2023-05-11 DIAGNOSIS — E1122 Type 2 diabetes mellitus with diabetic chronic kidney disease: Secondary | ICD-10-CM | POA: Diagnosis not present

## 2023-05-11 DIAGNOSIS — Z7985 Long-term (current) use of injectable non-insulin antidiabetic drugs: Secondary | ICD-10-CM | POA: Diagnosis not present

## 2023-05-11 DIAGNOSIS — N1831 Chronic kidney disease, stage 3a: Secondary | ICD-10-CM | POA: Diagnosis not present

## 2023-05-11 MED ORDER — TIRZEPATIDE 15 MG/0.5ML ~~LOC~~ SOAJ
15.0000 mg | SUBCUTANEOUS | 0 refills | Status: DC
Start: 2023-05-11 — End: 2023-05-25
  Filled 2023-05-11: qty 6, 84d supply, fill #0
  Filled 2023-05-22: qty 2, 28d supply, fill #0

## 2023-05-13 DIAGNOSIS — Z905 Acquired absence of kidney: Secondary | ICD-10-CM | POA: Diagnosis not present

## 2023-05-13 DIAGNOSIS — Z85528 Personal history of other malignant neoplasm of kidney: Secondary | ICD-10-CM | POA: Diagnosis not present

## 2023-05-13 DIAGNOSIS — Z08 Encounter for follow-up examination after completed treatment for malignant neoplasm: Secondary | ICD-10-CM | POA: Diagnosis not present

## 2023-05-13 DIAGNOSIS — C641 Malignant neoplasm of right kidney, except renal pelvis: Secondary | ICD-10-CM | POA: Diagnosis not present

## 2023-05-17 ENCOUNTER — Ambulatory Visit (INDEPENDENT_AMBULATORY_CARE_PROVIDER_SITE_OTHER): Payer: Medicare Other | Admitting: Neurology

## 2023-05-17 ENCOUNTER — Encounter: Payer: Self-pay | Admitting: Neurology

## 2023-05-17 VITALS — BP 153/82 | HR 73

## 2023-05-17 DIAGNOSIS — Z7984 Long term (current) use of oral hypoglycemic drugs: Secondary | ICD-10-CM | POA: Diagnosis not present

## 2023-05-17 DIAGNOSIS — I639 Cerebral infarction, unspecified: Secondary | ICD-10-CM

## 2023-05-17 DIAGNOSIS — E1142 Type 2 diabetes mellitus with diabetic polyneuropathy: Secondary | ICD-10-CM

## 2023-05-17 DIAGNOSIS — I669 Occlusion and stenosis of unspecified cerebral artery: Secondary | ICD-10-CM

## 2023-05-17 DIAGNOSIS — G459 Transient cerebral ischemic attack, unspecified: Secondary | ICD-10-CM

## 2023-05-17 NOTE — Progress Notes (Unsigned)
Patient here for qutenza She has been on Xarelto for a provoked DVT (just one) and suspicion of embolic strokes however over 4 years loop recorder did not find any afib or aflutter. No indication to ocntinue Xarelto. She has extensive white natter changes c/w prior history of smoking, obesity and other vascular risk factors will recommend stopping Xarelto and starting ASA 81mg . 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 ASA 81. No clear indication for 325 stroke prevention except she has large vessel stenosis intracranially so 325mg  is indicated with a history of stroke. Order CTA head.

## 2023-05-17 NOTE — Progress Notes (Unsigned)
Qutenza treatment #1  Patient in room 4. Qutenza procedure explained to patient.  Verbal consent given to proceed. Foot cleansed with soap and water, then dried. Qutenza 8% patch applied to left foot affected area (dorsum extending to plantar). Foot wrapped in coban.  Left on for 30 minutes. BP stable during and after procedure (see VS charting). Removed dressing and patches. Applied cleansing gel for one minute,  then removed with cloth. Foot cleansed with soap and water, then dried. Pt tolerated well, perhaps slight burning felt on foot in one area but overall tolerated very well. Pt appreciative.  Next treatment will be on 08/11/23 at 9:30 am. We have enough Qutenza left unopened for next appointment.

## 2023-05-18 ENCOUNTER — Encounter: Payer: Self-pay | Admitting: Neurology

## 2023-05-18 ENCOUNTER — Telehealth: Payer: Self-pay | Admitting: Neurology

## 2023-05-18 NOTE — Telephone Encounter (Signed)
medicare/[employer] sent to [facility] [phone]

## 2023-05-20 NOTE — Progress Notes (Signed)
TeleHealth Visit:  This visit was completed with telemedicine (audio/video) technology. [name] has verbally consented to this TeleHealth visit. The patient is located at home, the provider is located at home. The participants in this visit include the listed provider and patient. The visit was conducted today via MyChart video.  OBESITY [name] [name] is here to discuss her progress with her obesity treatment plan along with follow-up of her obesity related diagnoses.    Today's visit was # 82  Starting weight: 258 lbs Starting date: 05/07/2018 Weight reported at last virtual office visit: 05/11/23 lbs on 182.2 Today's reported weight (05/25/23):  182.2 lbs Total weight loss: 76 lbs Weight change since last visit: 0 lbs  Today's vitals per pt: BP- 124/78 HR- 68   Nutrition Plan: the Category 2 plan   Current exercise:  gym (30 cardio/bike/elliptical and 15-20 minutes weights) 5 days per week  Interim History:  She has been doing a lot of stress eating due to stress with her mother.  She is eating "lots of pretzels". She eating healthy at breakfast mostly (protein pop tart) but not always at lunch. Sometimes having egg salad at lunch or eating out. She isn't getting much protein at dinner.  Assessment/Plan:  1. 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 Last A1c was 5.4. CBGs: Not checking Episodes of hypoglycemia: no Medication(s): Mounjaro 15 mg SQ weekly  Lab Results  Component Value Date   HGBA1C 5.4 01/21/2023   HGBA1C 5.2 10/23/2021   HGBA1C 4.8 06/11/2021   Lab Results  Component Value Date   LDLCALC 34 01/21/2023   CREATININE 1.27 (H) 02/23/2023   No results found for: "GFR"  Plan: Continue and refill Mounjaro 15 mg SQ weekly  2. Other depression/emotional eating [name] has had issues with stress/emotional eating. Currently this is poorly controlled. Overall mood is stable. Medication(s):  Bupropion XL 300 mg daily  Plan: Continue and refill Bupropion XL 300 mg daily Start Lexapro 5 mg daily. Discussed keeping pretzels out of the home.  3. Morbid Obesity: Current BMI 32 [name] is currently in the action stage of change. As such, her goal is to continue with weight loss efforts.  She has agreed to the Category 2 plan.  Exercise goals: as is  Behavioral modification strategies: increasing lean protein intake, decreasing simple carbohydrates , emotional eating strategies, and get rid of junk food in the home.  [name] has agreed to follow-up with our clinic in 2 weeks.  No orders of the defined types were placed in this encounter.   Medications Discontinued During This Encounter  Medication Reason   tirzepatide [facility]) 15 MG/0.5ML Pen      Meds ordered this encounter  Medications   tirzepatide (MOUNJARO) 15 MG/0.5ML Pen    Sig: Inject 15 mg into the skin once a week.    Dispense:  2 mL    Refill:  0    Order Specific Question:   Supervising Provider    Answer:   Glennis Brink [[id number]]   escitalopram (LEXAPRO) 5 MG tablet    Sig: Take 1 tablet (5 mg total) by mouth daily.    Dispense:  90 tablet    Refill:  0    Order Specific Question:   Supervising Provider    Answer:   Glennis Brink [[id number]]      Objective:   VITALS: Per patient if applicable, see vitals. GENERAL: Alert and in no acute distress. CARDIOPULMONARY:  No increased WOB. Speaking in clear sentences.  PSYCH: Pleasant and cooperative. Speech normal rate and rhythm. Affect is appropriate. Insight and judgement are appropriate. Attention is focused, linear, and appropriate.  NEURO: Oriented as arrived to appointment on time with no prompting.   Attestation Statements:   Reviewed by clinician on day of visit: allergies, medications, problem list, medical history, surgical history, family history, social history, and previous encounter notes.   This was prepared with the assistance of Dragon  Medical.  Occasional wrong-word or sound-a-like substitutions may have occurred due to the inherent limitations of voice recognition software.

## 2023-05-21 ENCOUNTER — Ambulatory Visit
Admission: RE | Admit: 2023-05-21 | Discharge: 2023-05-21 | Disposition: A | Discharge status: Home / Self Care | Payer: Medicare Other | Source: Ambulatory Visit | Attending: Neurology

## 2023-05-21 DIAGNOSIS — I639 Cerebral infarction, unspecified: Secondary | ICD-10-CM | POA: Diagnosis not present

## 2023-05-21 DIAGNOSIS — I669 Occlusion and stenosis of unspecified cerebral artery: Secondary | ICD-10-CM | POA: Diagnosis not present

## 2023-05-21 DIAGNOSIS — G459 Transient cerebral ischemic attack, unspecified: Secondary | ICD-10-CM

## 2023-05-21 MED ORDER — IOPAMIDOL (ISOVUE-370) INJECTION 76%
100.0000 mL | Freq: Once | INTRAVENOUS | Status: AC | PRN
  Administered 2023-05-21: 60 mL via INTRAVENOUS

## 2023-05-22 ENCOUNTER — Other Ambulatory Visit (HOSPITAL_BASED_OUTPATIENT_CLINIC_OR_DEPARTMENT_OTHER): Payer: Self-pay

## 2023-05-23 ENCOUNTER — Encounter: Payer: Self-pay | Admitting: Neurology

## 2023-05-23 ENCOUNTER — Other Ambulatory Visit: Payer: Self-pay | Admitting: Neurology

## 2023-05-25 ENCOUNTER — Other Ambulatory Visit (HOSPITAL_BASED_OUTPATIENT_CLINIC_OR_DEPARTMENT_OTHER): Payer: Self-pay

## 2023-05-25 ENCOUNTER — Telehealth (INDEPENDENT_AMBULATORY_CARE_PROVIDER_SITE_OTHER): Payer: Medicare Other | Admitting: Family Medicine

## 2023-05-25 ENCOUNTER — Other Ambulatory Visit: Payer: Self-pay

## 2023-05-25 ENCOUNTER — Encounter (INDEPENDENT_AMBULATORY_CARE_PROVIDER_SITE_OTHER): Payer: Self-pay | Admitting: Family Medicine

## 2023-05-25 VITALS — Ht 63.0 in | Wt 182.0 lb

## 2023-05-25 DIAGNOSIS — Z6832 Body mass index (BMI) 32.0-32.9, adult: Secondary | ICD-10-CM | POA: Diagnosis not present

## 2023-05-25 DIAGNOSIS — N1831 Chronic kidney disease, stage 3a: Secondary | ICD-10-CM | POA: Diagnosis not present

## 2023-05-25 DIAGNOSIS — Z7985 Long-term (current) use of injectable non-insulin antidiabetic drugs: Secondary | ICD-10-CM

## 2023-05-25 DIAGNOSIS — E1122 Type 2 diabetes mellitus with diabetic chronic kidney disease: Secondary | ICD-10-CM | POA: Diagnosis not present

## 2023-05-25 DIAGNOSIS — F3289 Other specified depressive episodes: Secondary | ICD-10-CM

## 2023-05-25 MED ORDER — ESCITALOPRAM OXALATE 5 MG PO TABS
5.0000 mg | ORAL_TABLET | Freq: Every day | ORAL | 0 refills | Status: AC
Start: 2023-05-25 — End: ?

## 2023-05-25 MED ORDER — TIRZEPATIDE 15 MG/0.5ML ~~LOC~~ SOPN
15.0000 mg | PEN_INJECTOR | SUBCUTANEOUS | 0 refills | Status: AC
Start: 2023-05-25 — End: ?
  Filled 2023-05-25: qty 2, 28d supply, fill #0

## 2023-05-25 NOTE — Telephone Encounter (Signed)
Combined with other mychart message

## 2023-05-26 NOTE — Telephone Encounter (Signed)
Rx refilled.

## 2023-06-02 ENCOUNTER — Encounter: Payer: Self-pay | Admitting: Neurology

## 2023-06-02 NOTE — Telephone Encounter (Signed)
I called [facility] Radiology reading room and have confirmed the 8/30 CT angio head is on the list to be read. If it needs to be upgraded to stat then they can do that, but I was assured its on the list to be read as outpatient routine.

## 2023-06-04 DIAGNOSIS — R42 Dizziness and giddiness: Secondary | ICD-10-CM | POA: Diagnosis not present

## 2023-06-04 DIAGNOSIS — H903 Sensorineural hearing loss, bilateral: Secondary | ICD-10-CM | POA: Diagnosis not present

## 2023-06-08 ENCOUNTER — Encounter: Payer: Self-pay | Admitting: Neurology

## 2023-06-08 DIAGNOSIS — Z23 Encounter for immunization: Secondary | ICD-10-CM | POA: Diagnosis not present

## 2023-06-18 DIAGNOSIS — M542 Cervicalgia: Secondary | ICD-10-CM | POA: Diagnosis not present

## 2023-06-18 DIAGNOSIS — M9901 Segmental and somatic dysfunction of cervical region: Secondary | ICD-10-CM | POA: Diagnosis not present

## 2023-06-18 DIAGNOSIS — M25512 Pain in left shoulder: Secondary | ICD-10-CM | POA: Diagnosis not present

## 2023-06-18 DIAGNOSIS — M9902 Segmental and somatic dysfunction of thoracic region: Secondary | ICD-10-CM | POA: Diagnosis not present

## 2023-06-18 DIAGNOSIS — M9906 Segmental and somatic dysfunction of lower extremity: Secondary | ICD-10-CM | POA: Diagnosis not present

## 2023-06-18 DIAGNOSIS — M25511 Pain in right shoulder: Secondary | ICD-10-CM | POA: Diagnosis not present

## 2023-06-21 DIAGNOSIS — M542 Cervicalgia: Secondary | ICD-10-CM | POA: Diagnosis not present

## 2023-06-21 DIAGNOSIS — M25511 Pain in right shoulder: Secondary | ICD-10-CM | POA: Diagnosis not present

## 2023-06-21 DIAGNOSIS — R2681 Unsteadiness on feet: Secondary | ICD-10-CM | POA: Diagnosis not present

## 2023-06-21 DIAGNOSIS — R42 Dizziness and giddiness: Secondary | ICD-10-CM | POA: Diagnosis not present

## 2023-06-21 DIAGNOSIS — G629 Polyneuropathy, unspecified: Secondary | ICD-10-CM | POA: Diagnosis not present

## 2023-06-21 DIAGNOSIS — M25512 Pain in left shoulder: Secondary | ICD-10-CM | POA: Diagnosis not present

## 2023-06-21 DIAGNOSIS — M6281 Muscle weakness (generalized): Secondary | ICD-10-CM | POA: Diagnosis not present

## 2023-06-24 DIAGNOSIS — M6281 Muscle weakness (generalized): Secondary | ICD-10-CM | POA: Diagnosis not present

## 2023-06-24 DIAGNOSIS — G629 Polyneuropathy, unspecified: Secondary | ICD-10-CM | POA: Diagnosis not present

## 2023-06-24 DIAGNOSIS — R2681 Unsteadiness on feet: Secondary | ICD-10-CM | POA: Diagnosis not present

## 2023-06-24 DIAGNOSIS — R42 Dizziness and giddiness: Secondary | ICD-10-CM | POA: Diagnosis not present

## 2023-06-28 DIAGNOSIS — G629 Polyneuropathy, unspecified: Secondary | ICD-10-CM | POA: Diagnosis not present

## 2023-06-28 DIAGNOSIS — E538 Deficiency of other specified B group vitamins: Secondary | ICD-10-CM | POA: Diagnosis not present

## 2023-06-28 DIAGNOSIS — M6281 Muscle weakness (generalized): Secondary | ICD-10-CM | POA: Diagnosis not present

## 2023-06-28 DIAGNOSIS — E1140 Type 2 diabetes mellitus with diabetic neuropathy, unspecified: Secondary | ICD-10-CM | POA: Diagnosis not present

## 2023-06-28 DIAGNOSIS — F3289 Other specified depressive episodes: Secondary | ICD-10-CM | POA: Diagnosis not present

## 2023-06-28 DIAGNOSIS — E559 Vitamin D deficiency, unspecified: Secondary | ICD-10-CM | POA: Diagnosis not present

## 2023-06-28 DIAGNOSIS — E6609 Other obesity due to excess calories: Secondary | ICD-10-CM | POA: Diagnosis not present

## 2023-06-28 DIAGNOSIS — R42 Dizziness and giddiness: Secondary | ICD-10-CM | POA: Diagnosis not present

## 2023-06-28 DIAGNOSIS — E871 Hypo-osmolality and hyponatremia: Secondary | ICD-10-CM | POA: Diagnosis not present

## 2023-06-28 DIAGNOSIS — E8889 Other specified metabolic disorders: Secondary | ICD-10-CM | POA: Diagnosis not present

## 2023-06-28 DIAGNOSIS — N1831 Chronic kidney disease, stage 3a: Secondary | ICD-10-CM | POA: Diagnosis not present

## 2023-06-28 DIAGNOSIS — R2681 Unsteadiness on feet: Secondary | ICD-10-CM | POA: Diagnosis not present

## 2023-06-28 DIAGNOSIS — I152 Hypertension secondary to endocrine disorders: Secondary | ICD-10-CM | POA: Diagnosis not present

## 2023-06-28 DIAGNOSIS — Z9884 Bariatric surgery status: Secondary | ICD-10-CM | POA: Diagnosis not present

## 2023-06-28 DIAGNOSIS — D508 Other iron deficiency anemias: Secondary | ICD-10-CM | POA: Diagnosis not present

## 2023-06-28 DIAGNOSIS — Z6831 Body mass index (BMI) 31.0-31.9, adult: Secondary | ICD-10-CM | POA: Diagnosis not present

## 2023-06-29 ENCOUNTER — Ambulatory Visit (INDEPENDENT_AMBULATORY_CARE_PROVIDER_SITE_OTHER): Payer: Medicare Other | Admitting: Neurology

## 2023-06-29 DIAGNOSIS — M62472 Contracture of muscle, left ankle and foot: Secondary | ICD-10-CM

## 2023-06-29 MED ORDER — ONABOTULINUMTOXINA 200 UNITS IJ SOLR
155.0000 [IU] | Freq: Once | INTRAMUSCULAR | Status: AC
Start: 2023-06-29 — End: 2023-06-29
  Administered 2023-06-29: 200 [IU] via INTRAMUSCULAR

## 2023-06-29 NOTE — Progress Notes (Signed)
06/29/2023: botox improvement > 50% in severity of neuropathy, 200 units  04/07/2023; 200 Units. Also will try qulipta. 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 Hx of diabetic(per-diabetic) neuropathy.    Procedure: All procedures documented were medically necessary, reasonable and appropriate based on the patient's history, medical diagnosis and physician opinion. Verbal informed consent was obtained from the patient, patient was informed of potential risk of procedure, including bruising, bleeding, hematoma formation, infection, muscle weakness, muscle pain, numbness, transient hypertension, transient hyperglycemia and transient insomnia among others. All areas injected were topically clean with isopropyl rubbing alcohol. Nonsterile nonlatex gloves were worn during the procedure.    Injections were given unilateral to the left plantar surface of the foot medial surface ask her to point out the area of pain and the extensor digitorum brevis muscle and Tibials posterior. 200 units of Botox were used and none was wasted.   Dx: Z61.096  (In the future if we continue: [phone] CPT code and no additional limb today but 98119 for additional limb)   04/07/2023: Patient and I also talked about CPRS, the very unusual place for diabetic neuropathy, she also has knee pain could be peroneal neuropathy, I explained what CPRS is and we showed some pictures  online and also what peroneal neuropathy is and showed her images online.  We decided to try Qutenza for the pain, we discussed sympathetic nerve blocks if it is CPRS, and we decided to order an EMG nerve conduction study on her lower extremities.  This was all in addition to above procedure.

## 2023-06-29 NOTE — Progress Notes (Signed)
Botox- 200 units x 1 vial Lot: [id number] Expiration: 10/2025 NDC: 4401-0272-53  Bacteriostatic 0.9% Sodium Chloride- 2.2 mL  Lot: [id number]  Expiration: 12/21/2023 NDC: 4259-5638-75  Dx: I43.329   B/B Witnessed by Elana Alm

## 2023-06-30 DIAGNOSIS — R42 Dizziness and giddiness: Secondary | ICD-10-CM | POA: Diagnosis not present

## 2023-06-30 DIAGNOSIS — G629 Polyneuropathy, unspecified: Secondary | ICD-10-CM | POA: Diagnosis not present

## 2023-06-30 DIAGNOSIS — M6281 Muscle weakness (generalized): Secondary | ICD-10-CM | POA: Diagnosis not present

## 2023-06-30 DIAGNOSIS — R2681 Unsteadiness on feet: Secondary | ICD-10-CM | POA: Diagnosis not present

## 2023-07-02 DIAGNOSIS — M9906 Segmental and somatic dysfunction of lower extremity: Secondary | ICD-10-CM | POA: Diagnosis not present

## 2023-07-02 DIAGNOSIS — M9903 Segmental and somatic dysfunction of lumbar region: Secondary | ICD-10-CM | POA: Diagnosis not present

## 2023-07-02 DIAGNOSIS — M9905 Segmental and somatic dysfunction of pelvic region: Secondary | ICD-10-CM | POA: Diagnosis not present

## 2023-07-02 DIAGNOSIS — M9904 Segmental and somatic dysfunction of sacral region: Secondary | ICD-10-CM | POA: Diagnosis not present

## 2023-07-02 DIAGNOSIS — M9907 Segmental and somatic dysfunction of upper extremity: Secondary | ICD-10-CM | POA: Diagnosis not present

## 2023-07-02 DIAGNOSIS — M545 Low back pain, unspecified: Secondary | ICD-10-CM | POA: Diagnosis not present

## 2023-07-02 DIAGNOSIS — G5601 Carpal tunnel syndrome, right upper limb: Secondary | ICD-10-CM | POA: Diagnosis not present

## 2023-07-02 DIAGNOSIS — M62831 Muscle spasm of calf: Secondary | ICD-10-CM | POA: Diagnosis not present

## 2023-07-02 DIAGNOSIS — M25531 Pain in right wrist: Secondary | ICD-10-CM | POA: Diagnosis not present

## 2023-07-05 DIAGNOSIS — M6281 Muscle weakness (generalized): Secondary | ICD-10-CM | POA: Diagnosis not present

## 2023-07-05 DIAGNOSIS — R42 Dizziness and giddiness: Secondary | ICD-10-CM | POA: Diagnosis not present

## 2023-07-05 DIAGNOSIS — R2681 Unsteadiness on feet: Secondary | ICD-10-CM | POA: Diagnosis not present

## 2023-07-05 DIAGNOSIS — G629 Polyneuropathy, unspecified: Secondary | ICD-10-CM | POA: Diagnosis not present

## 2023-07-06 ENCOUNTER — Other Ambulatory Visit: Payer: Self-pay | Admitting: Podiatry

## 2023-07-06 DIAGNOSIS — M1712 Unilateral primary osteoarthritis, left knee: Secondary | ICD-10-CM | POA: Diagnosis not present

## 2023-07-06 DIAGNOSIS — M160 Bilateral primary osteoarthritis of hip: Secondary | ICD-10-CM | POA: Diagnosis not present

## 2023-07-06 DIAGNOSIS — M170 Bilateral primary osteoarthritis of knee: Secondary | ICD-10-CM | POA: Diagnosis not present

## 2023-07-06 DIAGNOSIS — M1711 Unilateral primary osteoarthritis, right knee: Secondary | ICD-10-CM | POA: Diagnosis not present

## 2023-07-07 DIAGNOSIS — R42 Dizziness and giddiness: Secondary | ICD-10-CM | POA: Diagnosis not present

## 2023-07-07 DIAGNOSIS — M6281 Muscle weakness (generalized): Secondary | ICD-10-CM | POA: Diagnosis not present

## 2023-07-07 DIAGNOSIS — G629 Polyneuropathy, unspecified: Secondary | ICD-10-CM | POA: Diagnosis not present

## 2023-07-07 DIAGNOSIS — R2681 Unsteadiness on feet: Secondary | ICD-10-CM | POA: Diagnosis not present

## 2023-07-09 DIAGNOSIS — G5601 Carpal tunnel syndrome, right upper limb: Secondary | ICD-10-CM | POA: Diagnosis not present

## 2023-07-09 DIAGNOSIS — M25531 Pain in right wrist: Secondary | ICD-10-CM | POA: Diagnosis not present

## 2023-07-12 ENCOUNTER — Other Ambulatory Visit: Payer: Self-pay

## 2023-07-12 ENCOUNTER — Emergency Department (HOSPITAL_BASED_OUTPATIENT_CLINIC_OR_DEPARTMENT_OTHER)
Admission: EM | Admit: 2023-07-12 | Discharge: 2023-07-12 | Disposition: A | Discharge status: Home / Self Care | Payer: Medicare Other | Attending: Emergency Medicine | Admitting: Emergency Medicine

## 2023-07-12 ENCOUNTER — Encounter (HOSPITAL_BASED_OUTPATIENT_CLINIC_OR_DEPARTMENT_OTHER): Payer: Self-pay | Admitting: Emergency Medicine

## 2023-07-12 DIAGNOSIS — E66811 Obesity, class 1: Secondary | ICD-10-CM | POA: Diagnosis not present

## 2023-07-12 DIAGNOSIS — I10 Essential (primary) hypertension: Secondary | ICD-10-CM | POA: Diagnosis not present

## 2023-07-12 DIAGNOSIS — R040 Epistaxis: Secondary | ICD-10-CM | POA: Insufficient documentation

## 2023-07-12 DIAGNOSIS — Z7901 Long term (current) use of anticoagulants: Secondary | ICD-10-CM | POA: Insufficient documentation

## 2023-07-12 DIAGNOSIS — E1140 Type 2 diabetes mellitus with diabetic neuropathy, unspecified: Secondary | ICD-10-CM | POA: Diagnosis not present

## 2023-07-12 DIAGNOSIS — Z6830 Body mass index (BMI) 30.0-30.9, adult: Secondary | ICD-10-CM | POA: Diagnosis not present

## 2023-07-12 DIAGNOSIS — F3289 Other specified depressive episodes: Secondary | ICD-10-CM | POA: Diagnosis not present

## 2023-07-12 DIAGNOSIS — E871 Hypo-osmolality and hyponatremia: Secondary | ICD-10-CM | POA: Diagnosis not present

## 2023-07-12 DIAGNOSIS — N1831 Chronic kidney disease, stage 3a: Secondary | ICD-10-CM | POA: Diagnosis not present

## 2023-07-12 DIAGNOSIS — D508 Other iron deficiency anemias: Secondary | ICD-10-CM | POA: Diagnosis not present

## 2023-07-12 MED ORDER — LIDOCAINE-EPINEPHRINE (PF) 2 %-1:200000 IJ SOLN
10.0000 mL | Freq: Once | INTRAMUSCULAR | Status: AC
  Administered 2023-07-12: 10 mL
  Filled 2023-07-12: qty 20

## 2023-07-12 MED ORDER — SILVER NITRATE-POT NITRATE 75-25 % EX MISC
1.0000 | Freq: Once | CUTANEOUS | Status: DC
  Filled 2023-07-12: qty 10

## 2023-07-12 MED ORDER — OXYMETAZOLINE HCL 0.05 % NA SOLN
1.0000 | Freq: Once | NASAL | Status: AC
  Administered 2023-07-12: 1 via NASAL

## 2023-07-12 NOTE — ED Provider Notes (Signed)
[facility] EMERGENCY DEPARTMENT AT [facility] Provider Note   CSN: [id number] Arrival date & time: 07/12/23  1036     History  Chief Complaint  Patient presents with   Epistaxis    [name] [name] is a 72 y.o. female.  72 yo F with a cc of right-sided epistaxis.  This been going on for about an hour.  Patient had a similar episode maybe about a month or 2 ago.  Was treated with a packing and was stable to follow-up with ENT.  Denies any injury to the area.  She thinks this episode actually is less severe than last time.   Epistaxis      Home Medications Prior to Admission medications   Medication Sig Start Date End Date Taking? Authorizing Provider  alprazolam Prudy Feeler) 2 MG tablet Take 1 tablet (2 mg total) by mouth 3 (three) times daily as needed for sleep. 09/29/22   Anson Fret, MD  buPROPion (WELLBUTRIN XL) 300 MG 24 hr tablet Take 1 tablet (300 mg total) by mouth daily. 02/10/23   Whitmire, Dawn, FNP  escitalopram (LEXAPRO) 5 MG tablet Take 1 tablet (5 mg total) by mouth daily. 05/25/23   Whitmire, Dawn, FNP  gabapentin (NEURONTIN) 300 MG capsule TAKE 2 CAPSULES 3 TIMES A  DAY 05/26/23   Anson Fret, MD  Iron, Ferrous Sulfate, 325 (65 Fe) MG TABS Take 325 mg by mouth daily. 02/10/23   Whitmire, Dawn, FNP  lidocaine-prilocaine (EMLA) cream APPLY TOPICALLY 1 APPLICATION AS NEEDED 04/21/23   Anson Fret, MD  losartan-hydrochlorothiazide (HYZAAR) 100-25 MG tablet Take 1 tablet by mouth daily. 03/18/22   Whitmire, Dawn, FNP  magnesium oxide (MAG-OX) 400 MG tablet Take 400 mg by mouth at bedtime.     [provider]  metoprolol succinate (TOPROL-XL) 25 MG 24 hr tablet Take 25 mg by mouth daily.    [provider]  Multiple Vitamins-Minerals (OCUVITE-LUTEIN PO) Take 1 tablet by mouth daily.    [provider]  NONFORMULARY OR COMPOUNDED ITEM Compound Cream 7A (allow alternate if needed for insurance): Amantadine 8%, Baclofen 2%,  Gabapentin 8%, Amitriptyline 4%, Bupivacaine 2%, Clonidine 0.2%, Nifedipine 2%.   Instructions: Apply 1-2 grams to the affected area 3-4 times daily. 02/23/22   Anson Fret, MD  rosuvastatin (CRESTOR) 20 MG tablet Take 1 tablet (20 mg total) by mouth at bedtime. 04/13/23   Whitmire, Dawn, FNP  tirzepatide [facility]) 15 MG/0.5ML Pen Inject 15 mg into the skin once a week. 05/25/23   Whitmire, Dawn, FNP  traMADol (ULTRAM) 50 MG tablet TAKE 1-4 TABLETS (50-200 MG TOTAL) BY MOUTH EVERY 6 (SIX) HOURS AS NEEDED. 400MG  MAX DAILY. 05/27/23   Anson Fret, MD  urea (UREA 10 HYDRATING) 10 % cream APPLY TO AFFECTED AREA EVERY DAY AS NEEDED 07/06/23   Standiford, Jenelle Mages, DPM  XARELTO 20 MG TABS tablet TAKE 1 TABLET DAILY WITH   SUPPER 09/29/22   Anson Fret, MD      Allergies    Ambien [zolpidem], Atorvastatin, Codeine, Penicillins, Simvastatin, Meclizine, Meclizine hcl, Other, and Byetta 10 mcg pen [exenatide]    Review of Systems   Review of Systems  HENT:  Positive for nosebleeds.     Physical Exam Updated Vital Signs BP (!) 177/102   Pulse 82   Temp 97.6 F (36.4 C) (Axillary)   Resp 16   Wt 78.9 kg   SpO2 100%   BMI 30.82 kg/m  Physical Exam Vitals and  nursing note reviewed.  Constitutional:      General: She is not in acute distress.    Appearance: She is well-developed. She is not diaphoretic.  HENT:     Head: Normocephalic and atraumatic.     Nose:     Comments: Large clot noted to the right naris.  With that removed she had some trickling of blood. Eyes:     Pupils: Pupils are equal, round, and reactive to light.  Cardiovascular:     Rate and Rhythm: Normal rate and regular rhythm.     Heart sounds: No murmur heard.    No friction rub. No gallop.  Pulmonary:     Effort: Pulmonary effort is normal.     Breath sounds: No wheezing or rales.  Abdominal:     General: There is no distension.     Palpations: Abdomen is soft.     Tenderness: There is no abdominal  tenderness.  Musculoskeletal:        General: No tenderness.     Cervical back: Normal range of motion and neck supple.  Skin:    General: Skin is warm and dry.  Neurological:     Mental Status: She is alert and oriented to person, place, and time.  Psychiatric:        Behavior: Behavior normal.     ED Results / Procedures / Treatments   Labs (all labs ordered are listed, but only abnormal results are displayed) Labs Reviewed - No data to display  EKG None  Radiology No results found.  Procedures .Epistaxis Management  Date/Time: 07/12/2023 12:47 PM  Performed by: Melene Plan, DO Authorized by: Melene Plan, DO   Consent:    Consent obtained:  Verbal   Consent given by:  Patient   Risks, benefits, and alternatives were discussed: yes     Risks discussed:  Bleeding, infection, nasal injury and pain   Alternatives discussed:  No treatment, delayed treatment and alternative treatment Universal protocol:    Patient identity confirmed:  Verbally with patient Anesthesia:    Anesthesia method:  Topical application   Topical anesthesia: afirin  mixed with lidocaine with epinephrine 2% Procedure details:    Treatment site:  R anterior   Treatment complexity:  Limited   Treatment episode: recurring   Post-procedure details:    Assessment:  Bleeding stopped   Procedure completion:  Tolerated well, no immediate complications     Medications Ordered in ED Medications  lidocaine-EPINEPHrine (XYLOCAINE W/EPI) 2 %-1:200000 (PF) injection 10 mL (has no administration in time range)  silver nitrate applicators applicator 1 Application (has no administration in time range)  oxymetazoline (AFRIN) 0.05 % nasal spray 1 spray (1 spray Each Nare Given by Other 07/12/23 1125)    ED Course/ Medical Decision Making/ A&P                                 Medical Decision Making Risk OTC drugs.   72 yo F with a chief complaints of right-sided epistaxis.  This been going on for about  an hour.  Will attempt to control with direct pressure.  Reassess.  Patient's bleeding has stopped on repeat assessment after 2 trials of a cotton pledget soaked with Afrin and lidocaine with epinephrine.  No signs of hypervascularity in Kiesselbach's plexus.  Will have her follow-up with ENT in the office.  12:49 PM:  I have discussed the diagnosis/risks/treatment options with the patient  and friend .  Evaluation and diagnostic testing in the emergency department does not suggest an emergent condition requiring admission or immediate intervention beyond what has been performed at this time.  They will follow up with ENT. We also discussed returning to the ED immediately if new or worsening sx occur. We discussed the sx which are most concerning (e.g., sudden worsening pain, fever, inability to tolerate by mouth, recurrent bleeding not controlled with direct pressure) that necessitate immediate return. Medications administered to the patient during their visit and any new prescriptions provided to the patient are listed below.  Medications given during this visit Medications  lidocaine-EPINEPHrine (XYLOCAINE W/EPI) 2 %-1:200000 (PF) injection 10 mL (has no administration in time range)  silver nitrate applicators applicator 1 Application (has no administration in time range)  oxymetazoline (AFRIN) 0.05 % nasal spray 1 spray (1 spray Each Nare Given by Other 07/12/23 1125)     The patient appears reasonably screen and/or stabilized for discharge and I doubt any other medical condition or other [facility] requiring further screening, evaluation, or treatment in the ED at this time prior to discharge.          Final Clinical Impression(s) / ED Diagnoses Final diagnoses:  Epistaxis    Rx / DC Orders ED Discharge Orders     None         Melene Plan, DO 07/12/23 1249

## 2023-07-12 NOTE — Discharge Instructions (Signed)
Put a small balance of Vaseline or antibiotic ointment on the tip of your small finger and apply just to the inside of your nostril 2 times a day.  You could also get a humidifier for your bedroom.  Try not to rub your nose or blow your nose for at least the next couple days.  If you do have a recurrent nosebleed then hold direct pressure for 15 minutes without stopping.  If this does not work then you can try 2 squirts of the medication that I have given you to take home and then try to hold for 15 minutes without stopping.  If that does not work then please return to the emergency department for repeat evaluation.  Please follow-up with ear nose and throat.  Call them today to set up an appointment.

## 2023-07-12 NOTE — ED Triage Notes (Signed)
Pt bib [facility] with epistaxis ~ 1 hr pta. Pt endorses thinners (xarelto)

## 2023-07-19 DIAGNOSIS — R2681 Unsteadiness on feet: Secondary | ICD-10-CM | POA: Diagnosis not present

## 2023-07-19 DIAGNOSIS — G629 Polyneuropathy, unspecified: Secondary | ICD-10-CM | POA: Diagnosis not present

## 2023-07-19 DIAGNOSIS — R42 Dizziness and giddiness: Secondary | ICD-10-CM | POA: Diagnosis not present

## 2023-07-19 DIAGNOSIS — M6281 Muscle weakness (generalized): Secondary | ICD-10-CM | POA: Diagnosis not present

## 2023-07-21 DIAGNOSIS — G629 Polyneuropathy, unspecified: Secondary | ICD-10-CM | POA: Diagnosis not present

## 2023-07-21 DIAGNOSIS — R2681 Unsteadiness on feet: Secondary | ICD-10-CM | POA: Diagnosis not present

## 2023-07-21 DIAGNOSIS — M6281 Muscle weakness (generalized): Secondary | ICD-10-CM | POA: Diagnosis not present

## 2023-07-21 DIAGNOSIS — R42 Dizziness and giddiness: Secondary | ICD-10-CM | POA: Diagnosis not present

## 2023-07-22 DIAGNOSIS — R92333 Mammographic heterogeneous density, bilateral breasts: Secondary | ICD-10-CM | POA: Diagnosis not present

## 2023-07-22 DIAGNOSIS — Z1231 Encounter for screening mammogram for malignant neoplasm of breast: Secondary | ICD-10-CM | POA: Diagnosis not present

## 2023-07-23 ENCOUNTER — Other Ambulatory Visit: Payer: Self-pay | Admitting: Neurology

## 2023-07-23 DIAGNOSIS — M5459 Other low back pain: Secondary | ICD-10-CM | POA: Diagnosis not present

## 2023-07-23 DIAGNOSIS — E1142 Type 2 diabetes mellitus with diabetic polyneuropathy: Secondary | ICD-10-CM

## 2023-07-23 DIAGNOSIS — G5692 Unspecified mononeuropathy of left upper limb: Secondary | ICD-10-CM | POA: Diagnosis not present

## 2023-07-23 DIAGNOSIS — R040 Epistaxis: Secondary | ICD-10-CM | POA: Diagnosis not present

## 2023-07-23 DIAGNOSIS — M160 Bilateral primary osteoarthritis of hip: Secondary | ICD-10-CM | POA: Diagnosis not present

## 2023-07-23 DIAGNOSIS — M9906 Segmental and somatic dysfunction of lower extremity: Secondary | ICD-10-CM | POA: Diagnosis not present

## 2023-07-26 DIAGNOSIS — R2681 Unsteadiness on feet: Secondary | ICD-10-CM | POA: Diagnosis not present

## 2023-07-26 DIAGNOSIS — M6281 Muscle weakness (generalized): Secondary | ICD-10-CM | POA: Diagnosis not present

## 2023-07-26 DIAGNOSIS — G629 Polyneuropathy, unspecified: Secondary | ICD-10-CM | POA: Diagnosis not present

## 2023-07-26 DIAGNOSIS — R42 Dizziness and giddiness: Secondary | ICD-10-CM | POA: Diagnosis not present

## 2023-07-27 DIAGNOSIS — I358 Other nonrheumatic aortic valve disorders: Secondary | ICD-10-CM | POA: Diagnosis not present

## 2023-07-27 DIAGNOSIS — Z0000 Encounter for general adult medical examination without abnormal findings: Secondary | ICD-10-CM | POA: Diagnosis not present

## 2023-07-27 DIAGNOSIS — F320 Major depressive disorder, single episode, mild: Secondary | ICD-10-CM | POA: Diagnosis not present

## 2023-07-27 DIAGNOSIS — E782 Mixed hyperlipidemia: Secondary | ICD-10-CM | POA: Diagnosis not present

## 2023-07-27 DIAGNOSIS — N1831 Chronic kidney disease, stage 3a: Secondary | ICD-10-CM | POA: Diagnosis not present

## 2023-07-27 DIAGNOSIS — E1140 Type 2 diabetes mellitus with diabetic neuropathy, unspecified: Secondary | ICD-10-CM | POA: Diagnosis not present

## 2023-07-27 DIAGNOSIS — I10 Essential (primary) hypertension: Secondary | ICD-10-CM | POA: Diagnosis not present

## 2023-07-27 DIAGNOSIS — Z23 Encounter for immunization: Secondary | ICD-10-CM | POA: Diagnosis not present

## 2023-07-28 DIAGNOSIS — E1140 Type 2 diabetes mellitus with diabetic neuropathy, unspecified: Secondary | ICD-10-CM | POA: Diagnosis not present

## 2023-07-28 DIAGNOSIS — F3289 Other specified depressive episodes: Secondary | ICD-10-CM | POA: Diagnosis not present

## 2023-07-28 DIAGNOSIS — E66811 Obesity, class 1: Secondary | ICD-10-CM | POA: Diagnosis not present

## 2023-07-28 DIAGNOSIS — Z6830 Body mass index (BMI) 30.0-30.9, adult: Secondary | ICD-10-CM | POA: Diagnosis not present

## 2023-07-28 DIAGNOSIS — I152 Hypertension secondary to endocrine disorders: Secondary | ICD-10-CM | POA: Diagnosis not present

## 2023-07-28 DIAGNOSIS — N1831 Chronic kidney disease, stage 3a: Secondary | ICD-10-CM | POA: Diagnosis not present

## 2023-07-29 DIAGNOSIS — R42 Dizziness and giddiness: Secondary | ICD-10-CM | POA: Diagnosis not present

## 2023-07-29 DIAGNOSIS — G629 Polyneuropathy, unspecified: Secondary | ICD-10-CM | POA: Diagnosis not present

## 2023-07-29 DIAGNOSIS — R2681 Unsteadiness on feet: Secondary | ICD-10-CM | POA: Diagnosis not present

## 2023-07-29 DIAGNOSIS — M6281 Muscle weakness (generalized): Secondary | ICD-10-CM | POA: Diagnosis not present

## 2023-08-02 DIAGNOSIS — R42 Dizziness and giddiness: Secondary | ICD-10-CM | POA: Diagnosis not present

## 2023-08-02 DIAGNOSIS — M6281 Muscle weakness (generalized): Secondary | ICD-10-CM | POA: Diagnosis not present

## 2023-08-02 DIAGNOSIS — R2681 Unsteadiness on feet: Secondary | ICD-10-CM | POA: Diagnosis not present

## 2023-08-02 DIAGNOSIS — G629 Polyneuropathy, unspecified: Secondary | ICD-10-CM | POA: Diagnosis not present

## 2023-08-04 ENCOUNTER — Other Ambulatory Visit: Payer: Self-pay | Admitting: Neurology

## 2023-08-04 DIAGNOSIS — M6281 Muscle weakness (generalized): Secondary | ICD-10-CM | POA: Diagnosis not present

## 2023-08-04 DIAGNOSIS — G629 Polyneuropathy, unspecified: Secondary | ICD-10-CM | POA: Diagnosis not present

## 2023-08-04 DIAGNOSIS — R42 Dizziness and giddiness: Secondary | ICD-10-CM | POA: Diagnosis not present

## 2023-08-04 DIAGNOSIS — R2681 Unsteadiness on feet: Secondary | ICD-10-CM | POA: Diagnosis not present

## 2023-08-06 ENCOUNTER — Other Ambulatory Visit: Payer: Self-pay | Admitting: Neurology

## 2023-08-07 ENCOUNTER — Encounter: Payer: Self-pay | Admitting: Neurology

## 2023-08-09 ENCOUNTER — Telehealth: Payer: Self-pay | Admitting: Neurology

## 2023-08-09 ENCOUNTER — Other Ambulatory Visit: Payer: Self-pay | Admitting: *Deleted

## 2023-08-09 MED ORDER — NONFORMULARY OR COMPOUNDED ITEM: Status: DC

## 2023-08-09 MED ORDER — METHYLPREDNISOLONE 4 MG PO TBPK: ORAL_TABLET | ORAL | 0 refills | Status: DC

## 2023-08-09 NOTE — Addendum Note (Signed)
Addended by: Levert Feinstein on: 08/09/2023 04:48 PM   Modules accepted: Orders

## 2023-08-09 NOTE — Telephone Encounter (Signed)
Requested Prescriptions   Pending Prescriptions Disp Refills   traMADol (ULTRAM) 50 MG tablet 30 tablet 0    Sig: Take 1-4 tablets (50-200 mg total) by mouth every 6 (six) hours as needed. 400mg  max daily.  Last seen 06/29/23 Next appt 08/11/23 Dispenses   Dispensed Days Supply Quantity Provider Pharmacy  TRAMADOL HCL 50 MG TABLET 05/27/2023 7 30 each Anson Fret, MD [id number]  TRAMADOL HCL 50 MG TABLET 04/06/2023 5 30 each Anson Fret, MD [id number] [place]

## 2023-08-09 NOTE — Telephone Encounter (Signed)
Meds ordered this encounter  Medications   methylPREDNISolone (MEDROL DOSEPAK) 4 MG TBPK tablet    Sig: Take 6 tablets then decrease by 1 tablet daily until gone.    Dispense:  21 tablet    Refill:  0

## 2023-08-09 NOTE — Telephone Encounter (Signed)
I sent [name] [name] a messag but she has not responded. I have declined her Tramadol for the following reason(see below). Also if her back is hurting worse she should really see an orthopaedic doc or a pain specialist for injections or other evaluation which she may have already done (if so she can contact them, if not we can refer). We can;t get her in soon unfortunately but she could also see her primary care to make sure nothing new is going on. See below why I hesitate to give tramadol, looks like someone gave her prednisone pak in the past if she did not have side effects I could call in a medrol dosepak for 6 days which helps? thanks  Please let her know why I hesitate to give her Tramadol: Tramadol can decrease your sodium levels and your sodium was really low 126 in June 2024 so I hesitate to give it to you. I know you have chronically low sodium but 126 is lower than usual so I hesitate.

## 2023-08-09 NOTE — Telephone Encounter (Signed)
Found discontinued  Start Date: 04/06/23 End Date: 05/27/23  Discontinued by: Anson Fret, MD on 05/27/2023 16:47

## 2023-08-09 NOTE — Addendum Note (Signed)
Addended by: Guy Begin on: 08/09/2023 03:37 PM   Modules accepted: Orders

## 2023-08-09 NOTE — Telephone Encounter (Signed)
I called pt.  She had not read the message in [place].  I did relayed the reasoning why not renewing Tramadol, and that is due to her low sodium levels in past.  She understands.  She has orthopedic whom she has seen before for other issues and does not feel a referral is needed.  She would like to try the medrol dosepack 6 day (she says try that prior to steroid injections in back).  I told her the side effects and she understands (insomnia, inc energy, inc appetite - most common).  [facility] [employer].  Appreciated call. Will look on mychart had not a chance as yet today.  Appreciated call back.

## 2023-08-10 ENCOUNTER — Other Ambulatory Visit: Payer: Self-pay | Admitting: Neurology

## 2023-08-11 ENCOUNTER — Ambulatory Visit (INDEPENDENT_AMBULATORY_CARE_PROVIDER_SITE_OTHER): Payer: Medicare Other | Admitting: Neurology

## 2023-08-11 ENCOUNTER — Encounter: Payer: Self-pay | Admitting: Neurology

## 2023-08-11 VITALS — BP 143/88 | HR 67 | Ht 63.0 in | Wt 172.6 lb

## 2023-08-11 DIAGNOSIS — I152 Hypertension secondary to endocrine disorders: Secondary | ICD-10-CM | POA: Diagnosis not present

## 2023-08-11 DIAGNOSIS — Z7985 Long-term (current) use of injectable non-insulin antidiabetic drugs: Secondary | ICD-10-CM

## 2023-08-11 DIAGNOSIS — E1140 Type 2 diabetes mellitus with diabetic neuropathy, unspecified: Secondary | ICD-10-CM | POA: Diagnosis not present

## 2023-08-11 DIAGNOSIS — N1831 Chronic kidney disease, stage 3a: Secondary | ICD-10-CM | POA: Diagnosis not present

## 2023-08-11 DIAGNOSIS — F3289 Other specified depressive episodes: Secondary | ICD-10-CM | POA: Diagnosis not present

## 2023-08-11 DIAGNOSIS — E1142 Type 2 diabetes mellitus with diabetic polyneuropathy: Secondary | ICD-10-CM

## 2023-08-11 DIAGNOSIS — E7849 Other hyperlipidemia: Secondary | ICD-10-CM | POA: Diagnosis not present

## 2023-08-11 DIAGNOSIS — E6609 Other obesity due to excess calories: Secondary | ICD-10-CM | POA: Diagnosis not present

## 2023-08-11 DIAGNOSIS — E66811 Obesity, class 1: Secondary | ICD-10-CM | POA: Diagnosis not present

## 2023-08-11 DIAGNOSIS — Z6830 Body mass index (BMI) 30.0-30.9, adult: Secondary | ICD-10-CM | POA: Diagnosis not present

## 2023-08-11 NOTE — Progress Notes (Signed)
Pt to rm 17, alone.  Qutenza to be applied (2nd treatment). Bp pre treatment. Pt relayed that had 24 hour pain then itching post 1st treatment.  Qutenza 8% patch cut to fit her Left foot area (dorsum extending to plantar) applied. 9147. Wrapped ith gauze and koban. Left on for 30 minutes.  Removed 1024. Applied cleansing gel for once minute, then removed with cloth.  Foot cleansed with soap and water then dried. Pt tolerated well.  Pt appreciative.  She will let us know how she fares.  Dr. Lucia Gaskins in room speaking with pt.  There are 2 patches and enough cleansing gel for future treatments if pt thinks it works.  Post BP taken pt to check out.  To call back as needed.

## 2023-08-12 ENCOUNTER — Other Ambulatory Visit: Payer: Self-pay | Admitting: Neurology

## 2023-08-12 ENCOUNTER — Telehealth: Payer: Self-pay | Admitting: Neurology

## 2023-08-12 ENCOUNTER — Encounter: Payer: Self-pay | Admitting: Neurology

## 2023-08-12 NOTE — Telephone Encounter (Signed)
Pt is requesting a refill for Compound pain creme(fomula 7A).  Pharmacy: [facility]  their [phone] their fax#[phone]

## 2023-08-12 NOTE — Telephone Encounter (Signed)
Noted, the refill is already pending Dr [name] Mace signature.

## 2023-08-13 ENCOUNTER — Other Ambulatory Visit: Payer: Self-pay | Admitting: Neurology

## 2023-08-13 MED ORDER — TRAMADOL HCL 50 MG PO TABS: 50.0000 mg | ORAL_TABLET | Freq: Four times a day (QID) | ORAL | 3 refills | Status: DC | PRN

## 2023-08-15 NOTE — Progress Notes (Signed)
1. Diabetic peripheral neuropathy [facility])     Patient here for qutenza. Procedure note 64640:    Qutenza procedure explained to patient.  Verbal consent given to proceed. 1 Qutenza 8% patches applied to left foot (over toes, top of foot).  Wrapped in coban.  Left on for 30 minutes.  Removed dressing and patches. Applied cleansing gel for one minute,  Removed with cloth. Pt tolerated well.  To check out and wheeled out to car.  Pt appreciative.  Will let us know how she is doing.

## 2023-08-16 ENCOUNTER — Other Ambulatory Visit: Payer: Self-pay | Admitting: *Deleted

## 2023-08-16 MED ORDER — NONFORMULARY OR COMPOUNDED ITEM: 99 refills | Status: AC

## 2023-08-18 ENCOUNTER — Encounter: Payer: Medicare Other | Attending: Psychology | Admitting: Psychology

## 2023-08-18 DIAGNOSIS — E785 Hyperlipidemia, unspecified: Secondary | ICD-10-CM | POA: Diagnosis not present

## 2023-08-18 DIAGNOSIS — E1159 Type 2 diabetes mellitus with other circulatory complications: Secondary | ICD-10-CM | POA: Insufficient documentation

## 2023-08-18 DIAGNOSIS — I152 Hypertension secondary to endocrine disorders: Secondary | ICD-10-CM | POA: Diagnosis not present

## 2023-08-18 DIAGNOSIS — I10 Essential (primary) hypertension: Secondary | ICD-10-CM | POA: Diagnosis not present

## 2023-08-18 DIAGNOSIS — Z794 Long term (current) use of insulin: Secondary | ICD-10-CM

## 2023-08-18 DIAGNOSIS — F09 Unspecified mental disorder due to known physiological condition: Secondary | ICD-10-CM | POA: Diagnosis not present

## 2023-08-18 DIAGNOSIS — I6340 Cerebral infarction due to embolism of unspecified cerebral artery: Secondary | ICD-10-CM | POA: Insufficient documentation

## 2023-08-18 DIAGNOSIS — Z8673 Personal history of transient ischemic attack (TIA), and cerebral infarction without residual deficits: Secondary | ICD-10-CM | POA: Diagnosis not present

## 2023-08-18 DIAGNOSIS — I350 Nonrheumatic aortic (valve) stenosis: Secondary | ICD-10-CM | POA: Diagnosis not present

## 2023-08-18 DIAGNOSIS — I4729 Other ventricular tachycardia: Secondary | ICD-10-CM | POA: Diagnosis not present

## 2023-08-18 DIAGNOSIS — I253 Aneurysm of heart: Secondary | ICD-10-CM | POA: Diagnosis not present

## 2023-08-18 NOTE — Progress Notes (Signed)
Neuropsychological Evaluation   Patient:  [name] [name]   DOB: [birth year]  MR Number: [id number]  Location: [facility] A DEPT OF [facility] [address] [place], [place] [address] [place] [address] Dept: [phone]  Start: 8 AM End: 9 AM  Provider/Observer:     Hershal Coria PsyD  Chief Complaint:      Chief Complaint  Patient presents with   Memory Loss   Cerebrovascular Accident   Other    Word finding and expressive language changes    Reason For Service:     [name] [name] is a 72 year old female referred for neuropsychological evaluation by the patient's treating neurologist Naomie Dean, MD, due to ongoing concerns around cognitive changes, including changes both acutely as well as residual changes in expressive language and word finding capacity, changes in memory and learning.  The patient has had long-term issues with peripheral neuropathy as well as previous TIAs and small strokes.  Patient reports that while her symptoms that developed suddenly after small stroke events have improved, there are residual changes related to memory and expressive language functioning.  Patient has had consideration for MS reviewed with no indication of demyelinating type of lesions noted on multiple MRIs and other studies to rule out potential of MS.  Patient does have previous episodes of B12 deficiency, potentially creating some neuropathy and diabetic polyneuropathy and indications of microvascular ischemic changes in white matter regions of her brain as well as residual of small stroke effects identified in neuroimaging.   Medical History:                         Past Medical History:  Diagnosis Date   Anemia     Arthritis      "knees, hips, possibly back" (04/08/2016)   Atrial septal aneurysm     B12 deficiency      a. following gastric bypass.   Back pain     Cancer of kidney (HCC)      Cataract     Chronic lower back pain      spondylosis   Cold feet     Daily headache      "short ones for the last month or 2" (04/08/2016)   Depression     Diabetes (HCC)     Dry mouth     DVT (deep venous thrombosis) (HCC)     Easy bruising     Floaters in visual field     GERD (gastroesophageal reflux disease)      takes Protonix daily "just to protect my stomach; not for reflux" (04/08/2016)   Hay fever     Heart murmur      "dx'd by 1 anesthesiologist years and years ago"   History of loop recorder 03/2016   Hyperlipidemia     Hypertension     Joint pain     Leg cramp     Macular degeneration      "just watching"   Neuropathy     Nosebleed     Palpitations     Peripheral edema     Peripheral neuropathy      not on any meds   Pituitary mass (HCC)      a. s/p endoscopic surgery 02/2016.   Pneumonia 12/2017   Sleep apnea      no CPAP since weight loss after bariatric surgery '12 (  04/08/2016)   Spondylosis     Stroke (HCC) 04/07/2016    a. 01/2016 and 03/2016. during admission had + LE DVT.   Swelling of extremity     TIA (transient ischemic attack)     Trouble in sleeping     Urinary incontinence     Weakness                                                                 Patient Active Problem List    Diagnosis Date Noted   Right carpal tunnel syndrome 11/03/2022   Contracture of muscle, right ankle and foot 04/13/2022   Disease related peripheral neuropathy 04/13/2022   Eating disorder 11/06/2021   Diabetic polyneuropathy associated with type 2 diabetes mellitus (HCC) 02/05/2021   Bariatric surgery status 01/02/2021   Coronary artery disease 01/02/2021   Decreased estrogen level 01/02/2021   Glaucoma 01/02/2021   History of diabetes mellitus 01/02/2021   Long term (current) use of anticoagulants 01/02/2021   Obstructive sleep apnea 01/02/2021   Osteoarthritis 01/02/2021   Personal history of transient ischemic attack (TIA), and cerebral infarction  without residual deficits 01/02/2021   Hyperlipidemia associated with type 2 diabetes mellitus (HCC) 04/10/2020   B12 deficiency 10/12/2019   Depression 08/24/2019   Encounter for counseling 07/11/2019   Peripheral edema 06/07/2019   Other insomnia 05/22/2019   Lower extremity edema 05/08/2019   Idiopathic peripheral neuropathy 05/08/2019   Intestinal malabsorption, unspecified 11/07/2018   Other hyperlipidemia 11/07/2018   Vitamin D deficiency 11/07/2018   Other constipation 10/24/2018   Pituitary adenoma (HCC) 09/22/2018   Aortic valve calcification 10/19/2016   Heart murmur 10/06/2016   Atrial septal aneurysm     DVT of deep femoral vein (HCC) 04/10/2016   Cerebrovascular accident (CVA) due to embolism of cerebral artery (HCC)     Expressive aphasia 04/09/2016   Anemia 04/09/2016   Hypocalcemia 04/09/2016   CVA (cerebral infarction) 04/08/2016   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 (HCC) 03/30/2016   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HCC), 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   Thalamic infarct, acute (HCC) 02/01/2016   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   Stroke (HCC) 01/31/2016   Stroke-like symptoms 01/31/2016   Hyperglycemia 01/31/2016   Diabetes mellitus (HCC)     Hypertension associated with type 2 diabetes mellitus (HCC)     Class 3 severe obesity with body mass index (BMI) of 45.0 to 49.9 in adult (HCC)        Onset and Duration of Symptoms: Patient reports that she first started noting symptoms about 2 years ago when she had what we now know was a small stroke event.  Patient noted acute word finding difficulties.  She reports that she could not come up with the word for curtains and had to physically point to them and after an extended period of time was able to coming up with name.  Patient presented to the emergency department and had an MRI around this time.  The patient reports that she started noticing more memory issues around the same time.  Patient reports that both  these expressive language and memory changes have improved over the past year or so they continue to be more problematic than they had been prior.  Patient reports that she  now knows she has had several strokes and several TIAs previously.  The patient reports that 2 of her mild stroke events resulted in acute symptoms.  One was during a period of very high blood pressure.  Patient has also had times of vertigo/dizziness and has had physical therapy and vestibular therapy conducted.  Patient also has a prescription for alprazolam.   Progression of Symptoms: Patient has had issues with peripheral neuropathy for some time and had acute worsening of word finding and memory issues that have improved over time.  Patient also has tremors in both hands with greater tremor in right versus left.  Patient reports that this is relatively mild and is at use.  Patient's mother and grandmother also had tremor as well.  Patient notes that she has had times with some limited episodes of visual hallucination at night and other times where she will just think something is there.  This is likely associated to significant sleep deficits.   Triggering Factors: Patient has had 3 small stroke events that she describes as TIAs although there have been imaging results showing the small stroke events.  1 of these events was diagnosed in May 2017 and a second and third events were noted in July 2017.  The patient does not know when one of them occurred but was found in MRI.  Patient reports that her second stroke event was associated with high blood pressure.  The third stroke event was with the acute aphasic symptoms.   Associated Symptoms (e.g., cognitive, emotional, behavioral): Patient denies any significant change in her mood state and denies depression or anxiety type symptoms.  Patient feels like she has improved over the past year or 2.   Additional Tests and Measures from other records:   Neuroimaging Results: Patient has  had multiple MRI/CT scans over the past 6 or 7 years and I will not review all of them.  Patient's most recent MRI identified the pituitary tumor that has been followed over time and is remained unchanged over the past several years.  MRI has identified extensive T2/FLAIR hyperintense foci predominantly in deep white matter with some foci in the subcortical and periventricular white matter.  MRI has also continued to demonstrate a couple of small lacunar infarctions intermixed in the white matter regions.  There were changes noted in right occipital lobe consistent with remote hemorrhagic event.  There are also additional foci noted in the left thalamus and the pons region.  No additional lesions were identified in white matter.  The extent of T2/FLAIR hyperintense foci were consistent with moderately severe chronic microvascular ischemic change that was greater than typical for age.   Laboratory Tests: Patient has had indications of low vitamin B12 in the past but recent studies over the past 4 years have all been within normal limits are high with supplements.  Patient's folate acid was also been within normal limits recently.  Vitamin D has been within normal limits.  Patient has had low insulin levels noted in studies.   Current Typical Mood State:  Appropriate, patient does have a history of some depressive type symptoms and anxiety/dizziness type symptoms.  Patient is prescription for alprazolam and bupropion for mood state.  Patient denies any acute or ongoing significant depression or anxiety type symptoms.   Sleep: Patient describes significant and severe sleep disturbance.  Patient reports that she may get between 5 and 5 and half hours of sleep at night.  Patient reports that she will sleep for 20 minutes  roughly and then lay in the bed awake for roughly 2 hours.  Patient reports that sometimes she is able to go back to sleep and sometimes she does not.  The patient describes lots of waking episodes  and bouts of significant insomnia.  Patient has been assessed for obstructive sleep apnea which has been a concern but recent nighttime blood oxygen levels were within normal limits.   Diet Pattern: Patient admits to a fairly poor diet at times but attempts to have a good diet with attempts of weight loss and past bariatric surgery.  Bariatric surgery was associated with decreased B12 which is addressed through supplements.  Patient does not always follow dietary plans developed around her bariatric surgery.  Tests Administered: Controlled Oral Word Association Test (COWAT; FAS & Animals)  Wechsler Adult Intelligence Scale, 4th Edition (WAIS-IV) Wechsler Memory Scale, 4th Edition (WMS-IV); Older Adult Battery   Participation Level:   Active  Participation Quality:  Appropriate      Behavioral Observation:  The patient appeared well-groomed and appropriately dressed. Her manners were polite and appropriate to the situation. The patient's attitude towards testing was positive and her effort was good.   Well Groomed, Alert, and Appropriate.   Test Results:   Initially, an estimation was made as to the patient's premorbid intellectual and cognitive functioning for comparison purposes with the current objective neuropsychological testing.  The patient graduated from high school and also completed her associates degree reporting that she always did well in academic subjects except for some relative difficulties in higher level math classes.  Patient participated in the [place] and drama clubs in school.  She was also in the beta club and scholastic letters.  Patient had classes and received her associates degree in fashion merchandising.  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  Hobbies and interests include reading and travel.  I will estimate that the patient's premorbid intellectual and cognitive levels likely were in the high average range relative  to a normative population premorbidly with some areas of strengths and weaknesses around this high average range.  An estimation was also made as to the validity of the current assessment.  The patient appeared to try her hardest throughout the roughly 4 hours of formal testing providing good effort and a positive attitude throughout.  Embedded validity checks including analysis of raw data from her digit span subtest and forced choice type questions from the memory test swelling suggest that this is a valid assessment with the patient putting forth good effort.  COWAT: FAS total=42 Z= 0.56 Animals total= 26 Z= 2.48 Initially, the patient was administered the control or word association test as she has described some word finding difficulties with acute exacerbations during one of her stroke events and ongoing difficulties or reductions in verbal fluency.  On the FAS portion of this measure, which assesses lexical fluency the patient performed in the average range with comparisons in her normative data to age, gender and education matched control group.  On the animal naming portion, which assesses semantic fluency the patient did particularly well.  She performed nearly 2-1/2 standard deviations above normative expectations.  During clinical interview with the patient the patient appeared to be fluent in language without paucity of language or significant loss of lexical fluency.  It does appear that she is generally rebounded from some of the acute changes in expressive language.  WAIS-IV:  Composite Score Summary          Scale Sum of Scaled Scores Composite Score Percentile Rank 95% Conf. Interval Qualitative Description  Verbal Comprehension 37 VCI 112 79 106-117 High Average  Perceptual Reasoning 22 PRI 84 14 79-91 Low Average  Working Memory 22 WMI 105 63 98-111 Average  Processing Speed 24 PSI 111 77 102-118 High Average  Full Scale 105 FSIQ 103 58 99-107 Average  General  Ability 59 GAI 99 47 94-104 Average    The patient was administered the Wechsler Adult Intelligence Scale-IV to provide a thorough assessment of a wide range of cognitive domains and a highly structured and well normed battery of measures.  Caution should be made as to interpretation of full-scale IQ score as the patient has described changes in cognitive functioning with recent cerebrovascular events and these global score should not be used as a descriptor of her lifelong functioning but is a descriptor of her current status.  2 composite scores were calculated in this assessment.  The patient produced a full-scale IQ score of 103 which falls at the 58th percentile and is in the average range.  This is slightly below predicted levels of premorbid functioning and suggest limited and potentially specific changes in cognitive functioning account for this change rather than widespread impairments.  We also calculated the patient's general abilities index score which places less emphasis on attention and information processing speed as they are the most vulnerable to acute change.  The patient produced a general abilities index score of 99 with falls at a 47% in the average range.  Both of these composite scores were impacted by the patient's primary or focal weakness on this collection of measures related to visual spatial and visual reasoning and problem-solving measures which were her general primary area of cognitive change from premorbid predictions.          Verbal Comprehension Subtests Summary        Subtest Raw Score Scaled Score Percentile Rank Reference Group Scaled Score SEM  Similarities 27 12 75 11 0.95  Vocabulary 43 12 75 12 0.67  Information 21 13 84 15 0.73    The patient produced a verbal comprehension index score of 112 which falls at the 79th percentile and is in the average range relative to a normative population.  This is consistent with premorbid estimations and the patient displayed  consistent performances on individual subtests within this domain.  The patient performed well and consistent with premorbid estimates on measures of her verbal reasoning and problem-solving ability, vocabulary knowledge and general fund of information.  These were all between the 75th and 84th percentile and comparisons normative population.          Perceptual Reasoning Subtests Summary        Subtest Raw Score Scaled Score Percentile Rank Reference Group Scaled Score SEM  Block Design 24 8 25 6  0.99  Matrix Reasoning 7 7 16 3  0.90  Visual Puzzles 7 7 16 5  0.99  The patient produced a perceptual reasoning index score of 84 which falls at the 14th percentile and is in the low average range relative to a normative population.  This is likely representing a significant change and is her primary area of cognitive change noted on objective cognitive assessment outside of visual memory components.  The patient showed mild but consistent deficits related to her capacity to analyze geometric patterns and make a hole-part recognition decisions, nonverbal reasoning capacity and broad visual intellectual capacity  perceptual organizational skills and fluid visual reasoning skills.  This is also consistent with the focal memory deficit identified related to visual memory versus well-preserved auditory memory capacity.           Working Comptroller Raw Score Scaled Score Percentile Rank Reference Group Scaled Score SEM  Digit Span 29 12 75 10 0.73  Arithmetic 13 10 50 9 1.20    The patient produced a working memory index score of 105 which falls at the 63rd percentile and is in the average range.  This is slightly below predicted measures of auditory encoding but this really had to do with very mild weaknesses or changes potentially related to her capacity to actively process information in her auditory Register as her primary auditory Register functions appear to be well-preserved  and consistent with premorbid estimates.          Processing Speed Subtests Summary        Subtest Raw Score Scaled Score Percentile Rank Reference Group Scaled Score SEM  Symbol Search 31 13 84 9 1.12  Coding 53 11 63 7 1.12    The patient produced a processing speed index score of 111 which falls at the 77th percentile and in the high average range relative to a normative population.  This is a relatively interesting finding given the degree of small vessel disease and white matter changes identified on MRI but the patient does appear to be maintaining her focus execute abilities and information processing speed capacity.  WMS-IV         Index Score Summary        Index Sum of Scaled Scores Index Score Percentile Rank 95% Confidence Interval Qualitative Descriptor  Auditory Memory (AMI) 55 123 94 116-128 Superior  Visual Memory (VMI) 10 73 4 69-79 Borderline  Immediate Memory (IMI) 29 97 42 91-103 Average  Delayed Memory (DMI) 36 112 79 103-119 High Average    The patient was also administered the Wechsler Memory Scale-IV to provide a thorough assessment of a wide range of memory and learning components.  On the Wechsler Adult Intelligence Scale, the patient performed well on measures of auditory encoding both primary encoding as well as her capacity to actively process information in her auditory Register.  The patient showed a similar performance on measures of visual encoding capacity indicated by her symbol span subtest where she produced a scaled score of 11 until at the 63rd percentile.  Both auditory and visual encoding appear to be adequate for effective auditory and visual memory and learning.  Breaking memory functions down between auditory versus visual the patient produced an auditory memory index score of 123 falling at the 94th percentile and in the superior range relative to a normative population.  The patient showed significant deficits for visual memory particularly in  comparison to her auditory memory.  The patient produced a visual memory index score of 73 which falls at the 4th percentile and in the borderline range relative to a normative population.  Given the fact that she performed well on visual encoding components these deficits and visual memory appear to be primary related to storage and organization of new visual information rather than an inability to encode that information.  Breaking memory functions down between immediate and delayed the patient showed good capacity for delayed memory.  While the patient showed excellent auditory memory and learning across the board both immediate and delayed she showed limited capacity  for initially storing and organizing new visual information.  However, the limited amount of visual information she initially stored was preserved over time generally with no significant loss as a function of time.         Primary Subtest Scaled Score Summary       Subtest Domain Raw Score Scaled Score Percentile Rank  Logical Memory I AM 37 12 75  Logical Memory II AM 30 15 95  Verbal Paired Associates I AM 25 12 75  Verbal Paired Associates II AM 10 16 98  Visual Reproduction I VM 19 5 5   Visual Reproduction II VM 5 5 5   Symbol Span VWM 19 11 63          Auditory Memory Process Score Summary      Process Score Raw Score Scaled Score Percentile Rank Cumulative Percentage (Base Rate)  LM II Recognition 21 - - >75%  VPA II Recognition 30 - - >75%  VPA II Word Recall 19 17 99 -           Visual Memory Process Score Summary      Process Score Raw Score Scaled Score Percentile Rank Cumulative Percentage (Base Rate)  VR II Recognition 3 - - 17-25%  VR II Copy 42 - - 51-75%     Impression/Diagnosis:   The results of the current neuropsychological evaluation looking at both subjective reports, available medical records as well as objective neuropsychological test data are consistent with the current issues being primarily  related to cerebrovascular events with the patient having previous multiple strokes and TIA events.  There is also likely some component in her day-to-day function related to medications including alprazolam use and chronic sleep disturbance and sleep deficits.  The identified abnormalities in right occipital lobe that were consistent with remote hemorrhage would also be consistent with the indications of primary higher cognitive deficits for right hemispheric function versus left hemispheric function and almost specifically in the visual processing and visual memory domains.  The patient showed very good word finding abilities and expressive language components both for lexical fluency and semantic fluency.  While the patient had some acute changes in expressive language during one of her vascular events I suspect this had to do with subcortical involvement likely in the noted findings of the thalamic and pons regions identified on MRI.  The patient showed primary focal weaknesses on a collection of measures related to visual spatial and visual reasoning/problem solving, consistent focal memory deficits for visual memory with well-preserved auditory memory capacity, generally well-preserved information processing speed and focus execute abilities etc. while the MRI conducted on 05/10/2023 noted in the impression section sequela of a small hemorrhagic stroke in the left occipital lobe, in the body of that interpretation it is described as a right occipital lobe and consistent with MRI from 2020.  I suspect that the location of this previous hemorrhagic stroke was likely the right occipital lobe rather than left occipital lobe.  While the primary/focal neurocognitive weakness had to do with visual-spatial, visual reasoning and visual memory the patient shows well-preserved expressive language capacity, verbal comprehension, auditory and visual encoding capacity, information processing speed/focus execute abilities.   The causative factor of her ongoing cognitive deficits are almost surely due to cerebrovascular disease/small vessel disease and previous stroke events that are likely worsened by some medication use for her anxiety and sleep disturbance/deficits.  I will sit down with the patient and go over the results of the current neuropsychological evaluation.  Clearly,  continued adherence to medication strategies to reduce her risk of new strokes in efforts to address chronic microvascular ischemic process will be most important.  The patient also has ongoing sleep disturbance and while recent studies have not indicate significant changes in blood oxygen levels during sleep that should continue to be monitored.  Careful focus on issues related to her metabolic status and in particular her diabetes will also be important.  Good diet, sustained physical activity etc. are all important.  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 Clinical Neuropsychologist

## 2023-08-24 ENCOUNTER — Encounter: Payer: Medicare Other | Attending: Psychology | Admitting: Psychology

## 2023-08-24 ENCOUNTER — Encounter: Payer: Self-pay | Admitting: Psychology

## 2023-08-24 DIAGNOSIS — I152 Hypertension secondary to endocrine disorders: Secondary | ICD-10-CM | POA: Insufficient documentation

## 2023-08-24 DIAGNOSIS — Z794 Long term (current) use of insulin: Secondary | ICD-10-CM | POA: Diagnosis not present

## 2023-08-24 DIAGNOSIS — I6340 Cerebral infarction due to embolism of unspecified cerebral artery: Secondary | ICD-10-CM | POA: Diagnosis not present

## 2023-08-24 DIAGNOSIS — F09 Unspecified mental disorder due to known physiological condition: Secondary | ICD-10-CM | POA: Insufficient documentation

## 2023-08-24 DIAGNOSIS — E1159 Type 2 diabetes mellitus with other circulatory complications: Secondary | ICD-10-CM | POA: Insufficient documentation

## 2023-08-24 NOTE — Progress Notes (Signed)
Neuropsychological Evaluation   Patient:  [name] [name]   DOB: 1951/02/23  MR Number: [id number]  Location: [facility] A DEPT OF [facility] [address] [place], [place] [address] [place] [address] Dept: [phone]  Start: 8 AM End: 9 AM  Provider/Observer:     Hershal Coria PsyD  Chief Complaint:      Chief Complaint  Patient presents with   Memory Loss   Cerebrovascular Accident   08/24/2023: Today I provided feedback regarding the results of the recent neuropsychological evaluation with diagnostic considerations and recommendations provided to patient.  She is being followed by Dr. Lucia Gaskins and will return to her now that this evaluation is completed.  I have included a copy of the reason for service and summary of my evaluation below for convenience and the full neuropsychological evaluative report can be found in the patient's EMR dated 08/18/2023 and the complete clinical interview and background history can be found in her EMR dated 11/17/2022.   Reason For Service:     [name] [name] is a 72 year old female referred for neuropsychological evaluation by the patient's treating neurologist Naomie Dean, MD, due to ongoing concerns around cognitive changes, including changes both acutely as well as residual changes in expressive language and word finding capacity, changes in memory and learning.  The patient has had long-term issues with peripheral neuropathy as well as previous TIAs and small strokes.  Patient reports that while her symptoms that developed suddenly after small stroke events have improved, there are residual changes related to memory and expressive language functioning.  Patient has had consideration for MS reviewed with no indication of demyelinating type of lesions noted on multiple MRIs and other studies to rule out potential of MS.  Patient does have previous episodes of  B12 deficiency, potentially creating some neuropathy and diabetic polyneuropathy and indications of microvascular ischemic changes in white matter regions of her brain as well as residual of small stroke effects identified in neuroimaging.     Impression/Diagnosis:   The results of the current neuropsychological evaluation looking at both subjective reports, available medical records as well as objective neuropsychological test data are consistent with the current issues being primarily related to cerebrovascular events with the patient having previous multiple strokes and TIA events.  There is also likely some component in her day-to-day function related to medications including alprazolam use and chronic sleep disturbance and sleep deficits.  The identified abnormalities in right occipital lobe that were consistent with remote hemorrhage would also be consistent with the indications of primary higher cognitive deficits for right hemispheric function versus left hemispheric function and almost specifically in the visual processing and visual memory domains.  The patient showed very good word finding abilities and expressive language components both for lexical fluency and semantic fluency.  While the patient had some acute changes in expressive language during one of her vascular events I suspect this had to do with subcortical involvement likely in the noted findings of the thalamic and pons regions identified on MRI.  The patient showed primary focal weaknesses on a collection of measures related to visual spatial and visual reasoning/problem solving, consistent focal memory deficits for visual memory with well-preserved auditory memory capacity, generally well-preserved information processing speed and focus execute abilities etc. while the MRI conducted on 05/10/2023 noted in the impression section sequela of a small hemorrhagic stroke in the left occipital lobe, in  the body of that interpretation it is  described as a right occipital lobe and consistent with MRI from 2020.  I suspect that the location of this previous hemorrhagic stroke was likely the right occipital lobe rather than left occipital lobe.  While the primary/focal neurocognitive weakness had to do with visual-spatial, visual reasoning and visual memory the patient shows well-preserved expressive language capacity, verbal comprehension, auditory and visual encoding capacity, information processing speed/focus execute abilities.  The causative factor of her ongoing cognitive deficits are almost surely due to cerebrovascular disease/small vessel disease and previous stroke events that are likely worsened by some medication use for her anxiety and sleep disturbance/deficits.  I will sit down with the patient and go over the results of the current neuropsychological evaluation.  Clearly, continued adherence to medication strategies to reduce her risk of new strokes in efforts to address chronic microvascular ischemic process will be most important.  The patient also has ongoing sleep disturbance and while recent studies have not indicate significant changes in blood oxygen levels during sleep that should continue to be monitored.  Careful focus on issues related to her metabolic status and in particular her diabetes will also be important.  Good diet, sustained physical activity etc. are all important.  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 Clinical Neuropsychologist

## 2023-09-07 ENCOUNTER — Encounter: Payer: Self-pay | Admitting: Neurology

## 2023-09-07 ENCOUNTER — Telehealth: Payer: Medicare Other | Admitting: Neurology

## 2023-09-07 DIAGNOSIS — G9050 Complex regional pain syndrome I, unspecified: Secondary | ICD-10-CM

## 2023-09-07 DIAGNOSIS — E1142 Type 2 diabetes mellitus with diabetic polyneuropathy: Secondary | ICD-10-CM | POA: Diagnosis not present

## 2023-09-07 DIAGNOSIS — G629 Polyneuropathy, unspecified: Secondary | ICD-10-CM

## 2023-09-07 DIAGNOSIS — M792 Neuralgia and neuritis, unspecified: Secondary | ICD-10-CM

## 2023-09-07 MED ORDER — LAMOTRIGINE ER 50 MG PO TB24
50.0000 mg | ORAL_TABLET | Freq: Every evening | ORAL | 6 refills | Status: DC
Start: 2023-09-07 — End: 2023-12-15

## 2023-09-07 NOTE — Progress Notes (Signed)
Virtual Visit via Video Note  I connected with [name] [name] on 09/07/23 at  4:00 PM EST by a video enabled telemedicine application and verified that I am speaking with the correct person using two identifiers.  Location: Patient: home Provider: office   I discussed the limitations of evaluation and management by telemedicine and the availability of in person appointments. The patient expressed understanding and agreed to proceed.  History of Present Illness: Polyneuropathy  The primary encounter diagnosis was Nerve pain. Diagnoses of Diabetic peripheral neuropathy (HCC), Polyneuropathy, Diabetic sensorimotor polyneuropathy (HCC), and Complex regional pain syndrome type 1, affecting unspecified site were also pertinent to this visit.    Follow Up Instructions:    I discussed the assessment and treatment plan with the patient. The patient was provided an opportunity to ask questions and all were answered. The patient agreed with the plan and demonstrated an understanding of the instructions.   The patient was advised to call back or seek an in-person evaluation if the symptoms worsen or if the condition fails to improve as anticipated.  I provided 30 minutes of non-face-to-face time during this encounter.   Anson Fret, MD  Assessment and Plan:  Has CTS, will see Dr. Conchita Paris for evaluation of surgical procedures due to worsening (see emg/ncs 10/2022)   2. Try Lamictal for neuropathic pain in the feet. Tried Tramadol, gabapentin, botox, qutenza(capsaicin patch), topical lidocaine, coumpounded topical (amitriptyline, gabapentin, numbing, and other compounde din the cream), tylenol, alpha-lipoic acid, eleavil/amitriptyline, capsaicin topical, robaxin, oxycodone  Meds ordered this encounter  Medications   lamoTRIgine (LAMICTAL XR) 50 MG 24 hour tablet    Sig: Take 1 tablet (50 mg total) by mouth at bedtime.    Dispense:  30 tablet    Refill:  6   3. Increase botox to  300mg  in the feet  4.  Consider Spinal stimulator  Discussed:   Spinal cord stimulation (SCS) is a treatment option for peripheral neuropathy, including diabetic peripheral neuropathy (DPN), that can help reduce pain and improve quality of life:   How it works A small pulse generator is implanted under the skin and two wire leads are placed near specific nerves in the spine. The device works by blocking nerve pain.   Effectiveness SCS can reduce pain by about 50% in 50-70% of patients. It's considered more effective than traditional medical management.   Benefits SCS can help patients with DPN who don't respond well to other treatments. It can also help patients with other refractory pain disorders, such as complex regional pain syndrome (CRPS) and failed back surgery syndrome (FBSS).   Procedure The procedure is minimally invasive and patients can usually go home the same day. Incisions typically heal within 2-4 weeks.   Complications Complications are rare, but can include infection, bleeding, or device migration.   Rechargeability Some implantable pulse generators (IPGs) are rechargeable and can last 10-25 years.   INCREASE 300 units next appointment   Physical exam: Exam: Gen: NAD, conversant      CV: No palpitations or chest pain or SOB. VS: Breathing at a normal rate. Weight appears obese. Not febrile. Eyes: Conjunctivae clear without exudates or hemorrhage  Neuro: Detailed Neurologic Exam  Speech:    Speech is normal; fluent and spontaneous with normal comprehension.  Cognition:    The patient is oriented to person, place, and time;     recent and remote memory intact;     language fluent;     normal attention, concentration, fund  of knowledge Cranial Nerves:    The pupils are equal, round, and reactive to light. Visual fields are full Extraocular movements are intact.  The face is symmetric with normal sensation. The palate elevates in the midline. Hearing  intact. Voice is normal. Shoulder shrug is normal. The tongue has normal motion without fasciculations.   Coordination: normal  Gait:    No abnormalities noted or reported  Motor Observation:   no involuntary movements noted. Tone:    Appears normal  Posture:    Posture is normal. normal erect    Strength:    Strength is anti-gravity and symmetric in the upper and lower limbs.      Sensation: intact to LT, no reports of numbness or tingling or paresthesias      PRIOR BOTOX: NEXT in January  06/29/2023: botox improvement > 50% in severity of neuropathy, 200 units  04/07/2023; 200 Units. Also will try qulipta. 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 Hx of diabetic(per-diabetic) neuropathy.    Procedure: All procedures documented were medically necessary, reasonable and appropriate based on the patient's history, medical diagnosis and physician opinion. Verbal informed consent was obtained from the patient, patient was informed of potential risk of procedure, including bruising, bleeding, hematoma formation, infection, muscle weakness, muscle pain, numbness, transient hypertension, transient hyperglycemia and transient insomnia among others. All areas injected were topically clean with isopropyl rubbing alcohol. Nonsterile nonlatex gloves were worn during the procedure.    Injections were given unilateral to the left plantar  surface of the foot medial surface ask her to point out the area of pain and the extensor digitorum brevis muscle and Tibials posterior. 200 units of Botox were used and none was wasted.   Dx: L87.564  (In the future if we continue: [phone] CPT code and no additional limb today but [id number] for additional limb)   04/07/2023: Patient and I also talked about CPRS, the very unusual place for diabetic neuropathy, she also has knee pain could be peroneal neuropathy, I explained what CPRS is and we showed some pictures online and also what peroneal neuropathy is and showed her images online.  We decided to try Qutenza for the pain, we discussed sympathetic nerve blocks if it is CPRS, and we decided to order an EMG nerve conduction study on her lower extremities.  This was all in addition to above procedure.

## 2023-09-07 NOTE — Patient Instructions (Addendum)
Has CTS, will see Dr. Conchita Paris for evaluation of surgical procedures due to worsening (see emg/ncs 10/2022)   2. Try Lamictal for neuropathic pain in the feet. Tried Tramadol, gabapentin, botox, qutenza(capsaicin patch), topical lidocaine, coumpounded topical (amitriptyline, gabapentin, numbing, and other compounde din the cream), tylenol, alpha-lipoic acid, eleavil/amitriptyline, capsaicin topical, robaxin, oxycodone  Meds ordered this encounter  Medications   lamoTRIgine (LAMICTAL XR) 50 MG 24 hour tablet    Sig: Take 1 tablet (50 mg total) by mouth at bedtime.    Dispense:  30 tablet    Refill:  6   3. Increase botox to 300mg  in the feet  4 Consider Spinal stimulator  Spinal cord stimulation (SCS) is a treatment option for peripheral neuropathy, including diabetic peripheral neuropathy (DPN), that can help reduce pain and improve quality of life:   How it works A small pulse generator is implanted under the skin and two wire leads are placed near specific nerves in the spine. The device works by blocking nerve pain.   Effectiveness SCS can reduce pain by about 50% in 50-70% of patients. It's considered more effective than traditional medical management.   Benefits SCS can help patients with DPN who don't respond well to other treatments. It can also help patients with other refractory pain disorders, such as complex regional pain syndrome (CRPS) and failed back surgery syndrome (FBSS).   Procedure The procedure is minimally invasive and patients can usually go home the same day. Incisions typically heal within 2-4 weeks.   Complications Complications are rare, but can include infection, bleeding, or device migration.   Rechargeability Some implantable pulse generators (IPGs) are rechargeable and can last 10-25 years.    Lamotrigine Tablets What is this medication? LAMOTRIGINE (la MOE Patrecia Pace) prevents and controls seizures in people with epilepsy. It may also be used to  treat bipolar disorder. It works by calming overactive nerves in your body. This medicine may be used for other purposes; ask your health care provider or pharmacist if you have questions. COMMON BRAND NAME(S): Lamictal, Subvenite What should I tell my care team before I take this medication? They need to know if you have any of these conditions: Heart disease History of irregular heartbeat Immune system problems Kidney disease Liver disease Low levels of folic acid in the blood Lupus Mental health condition Suicidal thoughts, plans, or attempt by you or a family member An unusual or allergic reaction to lamotrigine, other medications, foods, dyes, or preservatives Pregnant or trying to get pregnant Breastfeeding How should I use this medication? Take this medication by mouth with a glass of water. Follow the directions on the prescription label. Do not chew these tablets. If this medication upsets your stomach, take it with food or milk. Take your doses at regular intervals. Do not take your medication more often than directed. A special MedGuide will be given to you by the pharmacist with each new prescription and refill. Be sure to read this information carefully each time. Talk to your care team about the use of this medication in children. While this medication may be prescribed for children as young as 2 years for selected conditions, precautions do apply. Overdosage: If you think you have taken too much of this medicine contact a poison control center or emergency room at once. NOTE: This medicine is only for you. Do not share this medicine with others. What if I miss a dose? If you miss a dose, take it as soon as you can. If it  is almost time for your next dose, take only that dose. Do not take double or extra doses. What may interact with this medication? Atazanavir Certain medications for irregular heartbeat Certain medications for seizures, such as carbamazepine, phenobarbital,  phenytoin, primidone, or valproic acid Estrogen or progestin hormones Lopinavir Rifampin Ritonavir This list may not describe all possible interactions. Give your health care provider a list of all the medicines, herbs, non-prescription drugs, or dietary supplements you use. Also tell them if you smoke, drink alcohol, or use illegal drugs. Some items may interact with your medicine. What should I watch for while using this medication? Visit your care team for regular checks on your progress. If you take this medication for seizures, wear a Medic Alert bracelet or necklace. Carry an identification card with information about your condition, medications, and care team. It is important to take this medication exactly as directed. When first starting treatment, your dose will need to be adjusted slowly. It may take weeks or months before your dose is stable. You should contact your care team if your seizures get worse or if you have any new types of seizures. Do not stop taking this medication unless instructed by your care team. Stopping your medication suddenly can increase your seizures or their severity. This medication may cause serious skin reactions. They can happen weeks to months after starting the medication. Contact your care team right away if you notice fevers or flu-like symptoms with a rash. The rash may be red or purple and then turn into blisters or peeling of the skin. You may also notice a red rash with swelling of the face, lips, or lymph nodes in your neck or under your arms. This medication may affect your coordination, reaction time, or judgment. Do not drive or operate machinery until you know how this medication affects you. Sit up or stand slowly to reduce the risk of dizzy or fainting spells. Drinking alcohol with this medication can increase the risk of these side effects. If you are taking this medication for bipolar disorder, it is important to report any changes in your mood to  your care team. If your condition gets worse, you get mentally depressed, feel very hyperactive or manic, have difficulty sleeping, or have thoughts of hurting yourself or committing suicide, you need to get help from your care team right away. If you are a caregiver for someone taking this medication for bipolar disorder, you should also report these behavioral changes right away. The use of this medication may increase the chance of suicidal thoughts or actions. Pay special attention to how you are responding while on this medication. Your mouth may get dry. Chewing sugarless gum or sucking hard candy and drinking plenty of water may help. Contact your care team if the problem does not go away or is severe. If you become pregnant while using this medication, you may enroll in the [place] American Antiepileptic Drug Pregnancy Registry by calling [phone]. This registry collects information about the safety of antiepileptic medication use during pregnancy. This medication may cause a decrease in folic acid. You should make sure that you get enough folic acid while you are taking this medication. Discuss the foods you eat and the vitamins you take with your care team. What side effects may I notice from receiving this medication? Side effects that you should report to your care team as soon as possible: Allergic reactions--skin rash, itching, hives, swelling of the face, lips, tongue, or throat Change in vision Fever,  neck pain or stiffness, sensitivity to light, headache, nausea, vomiting, confusion Heart rhythm changes--fast or irregular heartbeat, dizziness, feeling faint or lightheaded, chest pain, trouble breathing Infection--fever, chills, cough, or sore throat Liver injury--right upper belly pain, loss of appetite, nausea, light-colored stool, dark yellow or brown urine, yellowing skin or eyes, unusual weakness or fatigue Low red blood cell count--unusual weakness or fatigue, dizziness,  headache, trouble breathing Rash, fever, and swollen lymph nodes Redness, blistering, peeling or loosening of the skin, including inside the mouth Thoughts of suicide or self-harm, worsening mood, or feelings of depression Unusual bruising or bleeding Side effects that usually do not require medical attention (report to your care team if they continue or are bothersome): Diarrhea Dizziness Drowsiness Headache Nausea Stomach pain Tremors or shaking This list may not describe all possible side effects. Call your doctor for medical advice about side effects. You may report side effects to FDA at 1-800-FDA-1088. Where should I keep my medication? Keep out of the reach of children and pets. Store at [employer] C (77 degrees F). Protect from light. Get rid of any unused medication after the expiration date. To get rid of medications that are no longer needed or have expired: Take the medication to a medication take-back program. Check with your pharmacy or law enforcement to find a location. If you cannot return the medication, check the label or package insert to see if the medication should be thrown out in the garbage or flushed down the toilet. If you are not sure, ask your care team. If it is safe to put it in the trash, empty the medication out of the container. Mix the medication with cat litter, dirt, coffee grounds, or other unwanted substance. Seal the mixture in a bag or container. Put it in the trash. NOTE: This sheet is a summary. It may not cover all possible information. If you have questions about this medicine, talk to your doctor, pharmacist, or health care provider.  2024 Elsevier/Gold Standard (2022-03-17 00:00:00)

## 2023-09-09 DIAGNOSIS — Z6830 Body mass index (BMI) 30.0-30.9, adult: Secondary | ICD-10-CM | POA: Diagnosis not present

## 2023-09-09 DIAGNOSIS — I152 Hypertension secondary to endocrine disorders: Secondary | ICD-10-CM | POA: Diagnosis not present

## 2023-09-09 DIAGNOSIS — E1140 Type 2 diabetes mellitus with diabetic neuropathy, unspecified: Secondary | ICD-10-CM | POA: Diagnosis not present

## 2023-09-09 DIAGNOSIS — E66811 Obesity, class 1: Secondary | ICD-10-CM | POA: Diagnosis not present

## 2023-09-09 DIAGNOSIS — N1831 Chronic kidney disease, stage 3a: Secondary | ICD-10-CM | POA: Diagnosis not present

## 2023-09-09 DIAGNOSIS — E669 Obesity, unspecified: Secondary | ICD-10-CM | POA: Diagnosis not present

## 2023-09-28 ENCOUNTER — Telehealth: Payer: Self-pay | Admitting: Neurology

## 2023-09-28 NOTE — Telephone Encounter (Signed)
 Per Dr. Ines, we will be increasing Botox  to 300 units at next visit. Pt has Medicare A+B as primary and [employer] FEP as secondary. I called [employer] to check if shara was required, spoke with Lyndell HERO. She states that since pt has Medicare as primary, shara will not be required unless related to transplant or gender affirming surgery. Reference # for call is [id number].

## 2023-10-02 ENCOUNTER — Other Ambulatory Visit: Payer: Self-pay | Admitting: Podiatry

## 2023-10-11 ENCOUNTER — Telehealth: Payer: Self-pay | Admitting: Neurology

## 2023-10-11 ENCOUNTER — Ambulatory Visit: Payer: Medicare Other | Admitting: Neurology

## 2023-10-11 DIAGNOSIS — G5601 Carpal tunnel syndrome, right upper limb: Secondary | ICD-10-CM | POA: Diagnosis not present

## 2023-10-11 DIAGNOSIS — D352 Benign neoplasm of pituitary gland: Secondary | ICD-10-CM | POA: Diagnosis not present

## 2023-10-11 DIAGNOSIS — I7300 Raynaud's syndrome without gangrene: Secondary | ICD-10-CM | POA: Diagnosis not present

## 2023-10-11 NOTE — Telephone Encounter (Signed)
Pt canelled appt due to having a low grade level,  cough, runny stuffy nose. Pt rescheduling Botox appointment.

## 2023-11-01 DIAGNOSIS — H353132 Nonexudative age-related macular degeneration, bilateral, intermediate dry stage: Secondary | ICD-10-CM | POA: Diagnosis not present

## 2023-11-08 ENCOUNTER — Telehealth: Payer: Self-pay | Admitting: Neurology

## 2023-11-11 ENCOUNTER — Ambulatory Visit: Payer: Medicare Other | Admitting: Neurology

## 2023-11-15 DIAGNOSIS — C641 Malignant neoplasm of right kidney, except renal pelvis: Secondary | ICD-10-CM | POA: Diagnosis not present

## 2023-11-17 DIAGNOSIS — I152 Hypertension secondary to endocrine disorders: Secondary | ICD-10-CM | POA: Diagnosis not present

## 2023-11-17 DIAGNOSIS — E1140 Type 2 diabetes mellitus with diabetic neuropathy, unspecified: Secondary | ICD-10-CM | POA: Diagnosis not present

## 2023-11-17 DIAGNOSIS — Z6831 Body mass index (BMI) 31.0-31.9, adult: Secondary | ICD-10-CM | POA: Diagnosis not present

## 2023-11-17 DIAGNOSIS — N1831 Chronic kidney disease, stage 3a: Secondary | ICD-10-CM | POA: Diagnosis not present

## 2023-11-17 DIAGNOSIS — E6609 Other obesity due to excess calories: Secondary | ICD-10-CM | POA: Diagnosis not present

## 2023-11-17 DIAGNOSIS — E66811 Obesity, class 1: Secondary | ICD-10-CM | POA: Diagnosis not present

## 2023-12-14 ENCOUNTER — Encounter: Payer: Self-pay | Admitting: Neurology

## 2023-12-15 ENCOUNTER — Other Ambulatory Visit: Payer: Self-pay | Admitting: Neurology

## 2023-12-15 DIAGNOSIS — F3289 Other specified depressive episodes: Secondary | ICD-10-CM | POA: Diagnosis not present

## 2023-12-15 DIAGNOSIS — Z6830 Body mass index (BMI) 30.0-30.9, adult: Secondary | ICD-10-CM | POA: Diagnosis not present

## 2023-12-15 DIAGNOSIS — I152 Hypertension secondary to endocrine disorders: Secondary | ICD-10-CM | POA: Diagnosis not present

## 2023-12-15 DIAGNOSIS — E66811 Obesity, class 1: Secondary | ICD-10-CM | POA: Diagnosis not present

## 2023-12-15 DIAGNOSIS — M792 Neuralgia and neuritis, unspecified: Secondary | ICD-10-CM

## 2023-12-15 DIAGNOSIS — N1831 Chronic kidney disease, stage 3a: Secondary | ICD-10-CM | POA: Diagnosis not present

## 2023-12-15 DIAGNOSIS — E1140 Type 2 diabetes mellitus with diabetic neuropathy, unspecified: Secondary | ICD-10-CM | POA: Diagnosis not present

## 2023-12-15 DIAGNOSIS — E6609 Other obesity due to excess calories: Secondary | ICD-10-CM | POA: Diagnosis not present

## 2023-12-15 MED ORDER — LAMOTRIGINE ER 100 MG PO TB24
100.0000 mg | ORAL_TABLET | Freq: Every evening | ORAL | 6 refills | Status: DC
Start: 2023-12-15 — End: 2024-02-22

## 2023-12-16 DIAGNOSIS — Z87891 Personal history of nicotine dependence: Secondary | ICD-10-CM | POA: Diagnosis not present

## 2023-12-16 DIAGNOSIS — Z905 Acquired absence of kidney: Secondary | ICD-10-CM | POA: Diagnosis not present

## 2023-12-16 DIAGNOSIS — I129 Hypertensive chronic kidney disease with stage 1 through stage 4 chronic kidney disease, or unspecified chronic kidney disease: Secondary | ICD-10-CM | POA: Diagnosis not present

## 2023-12-16 DIAGNOSIS — E871 Hypo-osmolality and hyponatremia: Secondary | ICD-10-CM | POA: Diagnosis not present

## 2023-12-16 DIAGNOSIS — N1831 Chronic kidney disease, stage 3a: Secondary | ICD-10-CM | POA: Diagnosis not present

## 2023-12-16 DIAGNOSIS — C641 Malignant neoplasm of right kidney, except renal pelvis: Secondary | ICD-10-CM | POA: Diagnosis not present

## 2023-12-20 DIAGNOSIS — E6609 Other obesity due to excess calories: Secondary | ICD-10-CM | POA: Diagnosis not present

## 2023-12-20 DIAGNOSIS — F3289 Other specified depressive episodes: Secondary | ICD-10-CM | POA: Diagnosis not present

## 2023-12-20 DIAGNOSIS — E1140 Type 2 diabetes mellitus with diabetic neuropathy, unspecified: Secondary | ICD-10-CM | POA: Diagnosis not present

## 2023-12-20 DIAGNOSIS — F320 Major depressive disorder, single episode, mild: Secondary | ICD-10-CM | POA: Diagnosis not present

## 2023-12-23 ENCOUNTER — Ambulatory Visit: Payer: Medicare Other | Admitting: Neurology

## 2024-01-05 DIAGNOSIS — E871 Hypo-osmolality and hyponatremia: Secondary | ICD-10-CM | POA: Diagnosis not present

## 2024-01-12 NOTE — Telephone Encounter (Signed)
 Error

## 2024-01-18 DIAGNOSIS — N1831 Chronic kidney disease, stage 3a: Secondary | ICD-10-CM | POA: Diagnosis not present

## 2024-01-18 DIAGNOSIS — D508 Other iron deficiency anemias: Secondary | ICD-10-CM | POA: Diagnosis not present

## 2024-01-18 DIAGNOSIS — F3289 Other specified depressive episodes: Secondary | ICD-10-CM | POA: Diagnosis not present

## 2024-01-18 DIAGNOSIS — E6609 Other obesity due to excess calories: Secondary | ICD-10-CM | POA: Diagnosis not present

## 2024-01-18 DIAGNOSIS — Z6830 Body mass index (BMI) 30.0-30.9, adult: Secondary | ICD-10-CM | POA: Diagnosis not present

## 2024-01-18 DIAGNOSIS — E66811 Obesity, class 1: Secondary | ICD-10-CM | POA: Diagnosis not present

## 2024-01-18 DIAGNOSIS — I152 Hypertension secondary to endocrine disorders: Secondary | ICD-10-CM | POA: Diagnosis not present

## 2024-01-18 DIAGNOSIS — E1140 Type 2 diabetes mellitus with diabetic neuropathy, unspecified: Secondary | ICD-10-CM | POA: Diagnosis not present

## 2024-01-18 DIAGNOSIS — Z9884 Bariatric surgery status: Secondary | ICD-10-CM | POA: Diagnosis not present

## 2024-01-18 DIAGNOSIS — E559 Vitamin D deficiency, unspecified: Secondary | ICD-10-CM | POA: Diagnosis not present

## 2024-01-18 DIAGNOSIS — Z23 Encounter for immunization: Secondary | ICD-10-CM | POA: Diagnosis not present

## 2024-01-19 DIAGNOSIS — E6609 Other obesity due to excess calories: Secondary | ICD-10-CM | POA: Diagnosis not present

## 2024-01-19 DIAGNOSIS — F3289 Other specified depressive episodes: Secondary | ICD-10-CM | POA: Diagnosis not present

## 2024-01-19 DIAGNOSIS — F320 Major depressive disorder, single episode, mild: Secondary | ICD-10-CM | POA: Diagnosis not present

## 2024-01-19 DIAGNOSIS — E1140 Type 2 diabetes mellitus with diabetic neuropathy, unspecified: Secondary | ICD-10-CM | POA: Diagnosis not present

## 2024-02-07 ENCOUNTER — Ambulatory Visit: Admitting: Podiatry

## 2024-02-07 DIAGNOSIS — F320 Major depressive disorder, single episode, mild: Secondary | ICD-10-CM | POA: Diagnosis not present

## 2024-02-07 DIAGNOSIS — T148XXA Other injury of unspecified body region, initial encounter: Secondary | ICD-10-CM | POA: Diagnosis not present

## 2024-02-07 DIAGNOSIS — N1831 Chronic kidney disease, stage 3a: Secondary | ICD-10-CM | POA: Diagnosis not present

## 2024-02-07 DIAGNOSIS — I10 Essential (primary) hypertension: Secondary | ICD-10-CM | POA: Diagnosis not present

## 2024-02-08 DIAGNOSIS — Z9884 Bariatric surgery status: Secondary | ICD-10-CM | POA: Diagnosis not present

## 2024-02-08 DIAGNOSIS — F3289 Other specified depressive episodes: Secondary | ICD-10-CM | POA: Diagnosis not present

## 2024-02-08 DIAGNOSIS — E871 Hypo-osmolality and hyponatremia: Secondary | ICD-10-CM | POA: Diagnosis not present

## 2024-02-08 DIAGNOSIS — D508 Other iron deficiency anemias: Secondary | ICD-10-CM | POA: Diagnosis not present

## 2024-02-08 DIAGNOSIS — I152 Hypertension secondary to endocrine disorders: Secondary | ICD-10-CM | POA: Diagnosis not present

## 2024-02-08 DIAGNOSIS — N1831 Chronic kidney disease, stage 3a: Secondary | ICD-10-CM | POA: Diagnosis not present

## 2024-02-08 DIAGNOSIS — Z6830 Body mass index (BMI) 30.0-30.9, adult: Secondary | ICD-10-CM | POA: Diagnosis not present

## 2024-02-08 DIAGNOSIS — E1140 Type 2 diabetes mellitus with diabetic neuropathy, unspecified: Secondary | ICD-10-CM | POA: Diagnosis not present

## 2024-02-08 DIAGNOSIS — E66811 Obesity, class 1: Secondary | ICD-10-CM | POA: Diagnosis not present

## 2024-02-08 DIAGNOSIS — E6609 Other obesity due to excess calories: Secondary | ICD-10-CM | POA: Diagnosis not present

## 2024-02-08 DIAGNOSIS — E559 Vitamin D deficiency, unspecified: Secondary | ICD-10-CM | POA: Diagnosis not present

## 2024-02-09 ENCOUNTER — Encounter: Payer: Self-pay | Admitting: Podiatry

## 2024-02-09 ENCOUNTER — Ambulatory Visit (INDEPENDENT_AMBULATORY_CARE_PROVIDER_SITE_OTHER): Admitting: Podiatry

## 2024-02-09 ENCOUNTER — Ambulatory Visit: Admitting: Podiatry

## 2024-02-09 DIAGNOSIS — E1142 Type 2 diabetes mellitus with diabetic polyneuropathy: Secondary | ICD-10-CM

## 2024-02-09 DIAGNOSIS — Q828 Other specified congenital malformations of skin: Secondary | ICD-10-CM

## 2024-02-09 DIAGNOSIS — M216X1 Other acquired deformities of right foot: Secondary | ICD-10-CM

## 2024-02-09 NOTE — Progress Notes (Addendum)
 This patient present to the office  with chief complaint of callus developing under the outside of ball of right foot..  She says this callus has become painful walking and wearing her shoes. Patient has provided no  treatment or sought professional help.  She presents to the office for treatment of her painful callus. Patient has CKD and DPN.  Vascular  Dorsalis pedis and posterior tibial pulses are palpable  B/L.  Capillary return  WNL.  Temperature gradient is  WNL.  Skin turgor  WNL  Sensorium  Senn Weinstein monofilament wire  WNL. Normal tactile sensation.  Nail Exam  Patient has normal nails with no evidence of bacterial or fungal infection.  Orthopedic  Exam  Muscle tone and muscle strength  WNL.  No limitations of motion feet  B/L.  No crepitus or joint effusion noted.  Foot type is unremarkable and digits show no abnormalities.  Bony prominences are unremarkable.  Plantar flexed fifth metatarsal right foot  Skin  No open lesions.  Normal skin texture and turgor.  Callus/porokeratosis  sub 5th  foot  Porokeratosis secondary plantar flexed fifth metatarsal  B/L  Debride callus/porokeratosis.  Discussed condition with patient.  Ruffin Cotton DPM

## 2024-02-19 DIAGNOSIS — E1140 Type 2 diabetes mellitus with diabetic neuropathy, unspecified: Secondary | ICD-10-CM | POA: Diagnosis not present

## 2024-02-19 DIAGNOSIS — E6609 Other obesity due to excess calories: Secondary | ICD-10-CM | POA: Diagnosis not present

## 2024-02-19 DIAGNOSIS — F3289 Other specified depressive episodes: Secondary | ICD-10-CM | POA: Diagnosis not present

## 2024-02-19 DIAGNOSIS — F320 Major depressive disorder, single episode, mild: Secondary | ICD-10-CM | POA: Diagnosis not present

## 2024-02-21 ENCOUNTER — Ambulatory Visit (INDEPENDENT_AMBULATORY_CARE_PROVIDER_SITE_OTHER): Admitting: Neurology

## 2024-02-21 VITALS — BP 127/74 | HR 70

## 2024-02-21 DIAGNOSIS — Z129 Encounter for screening for malignant neoplasm, site unspecified: Secondary | ICD-10-CM | POA: Diagnosis not present

## 2024-02-21 DIAGNOSIS — M62472 Contracture of muscle, left ankle and foot: Secondary | ICD-10-CM | POA: Diagnosis not present

## 2024-02-21 DIAGNOSIS — D1801 Hemangioma of skin and subcutaneous tissue: Secondary | ICD-10-CM | POA: Diagnosis not present

## 2024-02-21 DIAGNOSIS — L821 Other seborrheic keratosis: Secondary | ICD-10-CM | POA: Diagnosis not present

## 2024-02-21 DIAGNOSIS — L570 Actinic keratosis: Secondary | ICD-10-CM | POA: Diagnosis not present

## 2024-02-21 MED ORDER — ONABOTULINUMTOXINA 100 UNITS IJ SOLR: 300.0000 [IU] | Freq: Once | INTRAMUSCULAR | Status: DC

## 2024-02-21 NOTE — Progress Notes (Signed)
 Botox - 100 units x 3 vials Lot: [id number] Expiration: 01/2026 NDC: 5409-8119-14  Bacteriostatic 0.9% Sodium Chloride - 3 mL  Lot: [id number] Expiration: 02/2025 NDC: 2130-8657-84   Dx:M62.472  B/B Witnessed by Sherrian Doheny

## 2024-02-21 NOTE — Progress Notes (Signed)
 02/21/2024: botox  improvement > 50% in severity of neuropathy, 300 units  Refill tramadol  and xanax  for pain discussed using together can cause sedation/resp depression do not use together or on the same day, use sparingly and only as needed  Lamictal  not helping for pain, severe neuropathic. She had better luck with tegretol. Discussed, stop lamictal  and sart tegretol. Will mychart and ask her for history do not see prior prescription.  06/29/2023: botox  improvement > 50% in severity of neuropathy, 200 units  04/07/2023; 200 Units. Also will try qulipta. 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 Hx of diabetic(per-diabetic) neuropathy.    Procedure: All procedures documented were medically necessary, reasonable and appropriate based on the patient's history, medical diagnosis and physician opinion. Verbal informed consent was obtained from the patient, patient was informed of potential risk of procedure, including bruising, bleeding, hematoma formation, infection, muscle weakness, muscle pain, numbness, transient hypertension, transient hyperglycemia and transient insomnia among others. All areas injected were topically clean with isopropyl rubbing alcohol. Nonsterile nonlatex gloves were worn during the procedure.    Injections were given unilateral to the left plantar surface of the foot medial surface ask her to point out the area of pain and  the extensor digitorum brevis muscle and Tibials posterior. 200 units of Botox  were used and none was wasted.   Dx: Z61.096  (In the future if we continue: [phone] CPT code and no additional limb today but [phone] for additional limb)   04/07/2023: Patient and I also talked about CPRS, the very unusual place for diabetic neuropathy, she also has knee pain could be peroneal neuropathy, I explained what CPRS is and we showed some pictures online and also what peroneal neuropathy is and showed her images online.  We decided to try Qutenza  for the pain, we discussed sympathetic nerve blocks if it is CPRS, and we decided to order an EMG nerve conduction study on her lower extremities.  This was all in addition to above procedure.

## 2024-02-22 ENCOUNTER — Encounter: Payer: Self-pay | Admitting: Neurology

## 2024-02-22 DIAGNOSIS — E1140 Type 2 diabetes mellitus with diabetic neuropathy, unspecified: Secondary | ICD-10-CM | POA: Diagnosis not present

## 2024-02-22 DIAGNOSIS — R531 Weakness: Secondary | ICD-10-CM | POA: Diagnosis not present

## 2024-02-22 DIAGNOSIS — F3289 Other specified depressive episodes: Secondary | ICD-10-CM | POA: Diagnosis not present

## 2024-02-22 DIAGNOSIS — K5903 Drug induced constipation: Secondary | ICD-10-CM | POA: Diagnosis not present

## 2024-02-22 DIAGNOSIS — Z6830 Body mass index (BMI) 30.0-30.9, adult: Secondary | ICD-10-CM | POA: Diagnosis not present

## 2024-02-22 DIAGNOSIS — Z9884 Bariatric surgery status: Secondary | ICD-10-CM | POA: Diagnosis not present

## 2024-02-22 DIAGNOSIS — E6609 Other obesity due to excess calories: Secondary | ICD-10-CM | POA: Diagnosis not present

## 2024-02-22 DIAGNOSIS — N1831 Chronic kidney disease, stage 3a: Secondary | ICD-10-CM | POA: Diagnosis not present

## 2024-02-22 DIAGNOSIS — G5601 Carpal tunnel syndrome, right upper limb: Secondary | ICD-10-CM | POA: Diagnosis not present

## 2024-02-22 DIAGNOSIS — I152 Hypertension secondary to endocrine disorders: Secondary | ICD-10-CM | POA: Diagnosis not present

## 2024-02-22 DIAGNOSIS — M25531 Pain in right wrist: Secondary | ICD-10-CM | POA: Diagnosis not present

## 2024-02-22 DIAGNOSIS — E66811 Obesity, class 1: Secondary | ICD-10-CM | POA: Diagnosis not present

## 2024-02-22 MED ORDER — ALPRAZOLAM 2 MG PO TABS: 2.0000 mg | ORAL_TABLET | Freq: Every evening | ORAL | 1 refills | Status: DC | PRN

## 2024-02-22 MED ORDER — TRAMADOL HCL 50 MG PO TABS: 50.0000 mg | ORAL_TABLET | Freq: Four times a day (QID) | ORAL | 3 refills | Status: DC | PRN

## 2024-03-01 ENCOUNTER — Other Ambulatory Visit: Payer: Self-pay | Admitting: Neurology

## 2024-03-01 DIAGNOSIS — G6289 Other specified polyneuropathies: Secondary | ICD-10-CM

## 2024-03-01 MED ORDER — LAMOTRIGINE ER 50 MG PO TB24: ORAL_TABLET | ORAL | 3 refills | Status: DC

## 2024-03-02 DIAGNOSIS — S20212A Contusion of left front wall of thorax, initial encounter: Secondary | ICD-10-CM | POA: Diagnosis not present

## 2024-03-02 DIAGNOSIS — R0781 Pleurodynia: Secondary | ICD-10-CM | POA: Diagnosis not present

## 2024-03-10 ENCOUNTER — Observation Stay (HOSPITAL_BASED_OUTPATIENT_CLINIC_OR_DEPARTMENT_OTHER)
Admission: EM | Admit: 2024-03-10 | Discharge: 2024-03-11 | Disposition: A | Discharge status: Home / Self Care | Attending: Emergency Medicine | Admitting: Emergency Medicine

## 2024-03-10 ENCOUNTER — Emergency Department (HOSPITAL_COMMUNITY)

## 2024-03-10 ENCOUNTER — Other Ambulatory Visit: Payer: Self-pay

## 2024-03-10 ENCOUNTER — Encounter (HOSPITAL_COMMUNITY): Payer: Self-pay

## 2024-03-10 DIAGNOSIS — Z87891 Personal history of nicotine dependence: Secondary | ICD-10-CM | POA: Diagnosis not present

## 2024-03-10 DIAGNOSIS — Z8249 Family history of ischemic heart disease and other diseases of the circulatory system: Secondary | ICD-10-CM | POA: Diagnosis not present

## 2024-03-10 DIAGNOSIS — D352 Benign neoplasm of pituitary gland: Secondary | ICD-10-CM | POA: Diagnosis not present

## 2024-03-10 DIAGNOSIS — Z96653 Presence of artificial knee joint, bilateral: Secondary | ICD-10-CM | POA: Diagnosis not present

## 2024-03-10 DIAGNOSIS — Z880 Allergy status to penicillin: Secondary | ICD-10-CM | POA: Diagnosis not present

## 2024-03-10 DIAGNOSIS — I129 Hypertensive chronic kidney disease with stage 1 through stage 4 chronic kidney disease, or unspecified chronic kidney disease: Secondary | ICD-10-CM | POA: Diagnosis not present

## 2024-03-10 DIAGNOSIS — I609 Nontraumatic subarachnoid hemorrhage, unspecified: Secondary | ICD-10-CM | POA: Diagnosis not present

## 2024-03-10 DIAGNOSIS — Z905 Acquired absence of kidney: Secondary | ICD-10-CM | POA: Diagnosis not present

## 2024-03-10 DIAGNOSIS — E860 Dehydration: Secondary | ICD-10-CM | POA: Diagnosis not present

## 2024-03-10 DIAGNOSIS — S065XAA Traumatic subdural hemorrhage with loss of consciousness status unknown, initial encounter: Secondary | ICD-10-CM | POA: Diagnosis not present

## 2024-03-10 DIAGNOSIS — F1090 Alcohol use, unspecified, uncomplicated: Secondary | ICD-10-CM | POA: Insufficient documentation

## 2024-03-10 DIAGNOSIS — I10 Essential (primary) hypertension: Secondary | ICD-10-CM | POA: Insufficient documentation

## 2024-03-10 DIAGNOSIS — S065X0A Traumatic subdural hemorrhage without loss of consciousness, initial encounter: Secondary | ICD-10-CM | POA: Diagnosis not present

## 2024-03-10 DIAGNOSIS — R110 Nausea: Secondary | ICD-10-CM | POA: Diagnosis present

## 2024-03-10 DIAGNOSIS — E785 Hyperlipidemia, unspecified: Secondary | ICD-10-CM | POA: Diagnosis not present

## 2024-03-10 DIAGNOSIS — E1169 Type 2 diabetes mellitus with other specified complication: Secondary | ICD-10-CM | POA: Diagnosis not present

## 2024-03-10 DIAGNOSIS — E119 Type 2 diabetes mellitus without complications: Secondary | ICD-10-CM | POA: Insufficient documentation

## 2024-03-10 DIAGNOSIS — W19XXXA Unspecified fall, initial encounter: Secondary | ICD-10-CM | POA: Diagnosis not present

## 2024-03-10 DIAGNOSIS — Z85528 Personal history of other malignant neoplasm of kidney: Secondary | ICD-10-CM | POA: Diagnosis not present

## 2024-03-10 DIAGNOSIS — G44309 Post-traumatic headache, unspecified, not intractable: Secondary | ICD-10-CM | POA: Diagnosis not present

## 2024-03-10 DIAGNOSIS — E1142 Type 2 diabetes mellitus with diabetic polyneuropathy: Secondary | ICD-10-CM | POA: Diagnosis not present

## 2024-03-10 DIAGNOSIS — Z96641 Presence of right artificial hip joint: Secondary | ICD-10-CM | POA: Diagnosis not present

## 2024-03-10 DIAGNOSIS — Z79899 Other long term (current) drug therapy: Secondary | ICD-10-CM | POA: Diagnosis not present

## 2024-03-10 DIAGNOSIS — S0633AS Contusion and laceration of cerebrum, unspecified, with loss of consciousness status unknown, sequela: Secondary | ICD-10-CM | POA: Diagnosis not present

## 2024-03-10 DIAGNOSIS — E871 Hypo-osmolality and hyponatremia: Secondary | ICD-10-CM | POA: Diagnosis not present

## 2024-03-10 DIAGNOSIS — M542 Cervicalgia: Secondary | ICD-10-CM | POA: Diagnosis not present

## 2024-03-10 DIAGNOSIS — N1831 Chronic kidney disease, stage 3a: Secondary | ICD-10-CM | POA: Diagnosis not present

## 2024-03-10 DIAGNOSIS — Z7985 Long-term (current) use of injectable non-insulin antidiabetic drugs: Secondary | ICD-10-CM | POA: Diagnosis not present

## 2024-03-10 DIAGNOSIS — S069XAA Unspecified intracranial injury with loss of consciousness status unknown, initial encounter: Secondary | ICD-10-CM | POA: Diagnosis present

## 2024-03-10 DIAGNOSIS — R519 Headache, unspecified: Secondary | ICD-10-CM | POA: Diagnosis not present

## 2024-03-10 DIAGNOSIS — G6289 Other specified polyneuropathies: Secondary | ICD-10-CM | POA: Diagnosis not present

## 2024-03-10 DIAGNOSIS — S066X0A Traumatic subarachnoid hemorrhage without loss of consciousness, initial encounter: Secondary | ICD-10-CM | POA: Diagnosis not present

## 2024-03-10 DIAGNOSIS — S066XAA Traumatic subarachnoid hemorrhage with loss of consciousness status unknown, initial encounter: Secondary | ICD-10-CM | POA: Diagnosis not present

## 2024-03-10 DIAGNOSIS — Z885 Allergy status to narcotic agent status: Secondary | ICD-10-CM | POA: Diagnosis not present

## 2024-03-10 DIAGNOSIS — Z7982 Long term (current) use of aspirin: Secondary | ICD-10-CM | POA: Diagnosis not present

## 2024-03-10 DIAGNOSIS — S0990XA Unspecified injury of head, initial encounter: Principal | ICD-10-CM

## 2024-03-10 DIAGNOSIS — I6782 Cerebral ischemia: Secondary | ICD-10-CM | POA: Diagnosis not present

## 2024-03-10 DIAGNOSIS — I611 Nontraumatic intracerebral hemorrhage in hemisphere, cortical: Secondary | ICD-10-CM | POA: Diagnosis not present

## 2024-03-10 DIAGNOSIS — W19XXXD Unspecified fall, subsequent encounter: Secondary | ICD-10-CM | POA: Diagnosis present

## 2024-03-10 DIAGNOSIS — Z888 Allergy status to other drugs, medicaments and biological substances status: Secondary | ICD-10-CM | POA: Diagnosis not present

## 2024-03-10 DIAGNOSIS — Z043 Encounter for examination and observation following other accident: Secondary | ICD-10-CM | POA: Diagnosis not present

## 2024-03-10 DIAGNOSIS — Z9884 Bariatric surgery status: Secondary | ICD-10-CM | POA: Diagnosis not present

## 2024-03-10 DIAGNOSIS — I629 Nontraumatic intracranial hemorrhage, unspecified: Secondary | ICD-10-CM | POA: Diagnosis not present

## 2024-03-10 DIAGNOSIS — H3530 Unspecified macular degeneration: Secondary | ICD-10-CM | POA: Diagnosis not present

## 2024-03-10 DIAGNOSIS — H538 Other visual disturbances: Secondary | ICD-10-CM | POA: Diagnosis not present

## 2024-03-10 DIAGNOSIS — I6200 Nontraumatic subdural hemorrhage, unspecified: Secondary | ICD-10-CM | POA: Diagnosis not present

## 2024-03-10 DIAGNOSIS — E1122 Type 2 diabetes mellitus with diabetic chronic kidney disease: Secondary | ICD-10-CM | POA: Diagnosis not present

## 2024-03-10 DIAGNOSIS — K219 Gastro-esophageal reflux disease without esophagitis: Secondary | ICD-10-CM | POA: Diagnosis not present

## 2024-03-10 LAB — CBC WITH DIFFERENTIAL/PLATELET
Abs Immature Granulocytes: 0.02 10*3/uL (ref 0.00–0.07)
Basophils Absolute: 0.1 10*3/uL (ref 0.0–0.1)
Basophils Relative: 1 %
Eosinophils Absolute: 0.1 10*3/uL (ref 0.0–0.5)
Eosinophils Relative: 1 %
HCT: 41.9 % (ref 36.0–46.0)
Hemoglobin: 14.5 g/dL (ref 12.0–15.0)
Immature Granulocytes: 0 %
Lymphocytes Relative: 19 %
Lymphs Abs: 1.4 10*3/uL (ref 0.7–4.0)
MCH: 30.9 pg (ref 26.0–34.0)
MCHC: 34.6 g/dL (ref 30.0–36.0)
MCV: 89.1 fL (ref 80.0–100.0)
Monocytes Absolute: 0.6 10*3/uL (ref 0.1–1.0)
Monocytes Relative: 8 %
Neutro Abs: 5.3 10*3/uL (ref 1.7–7.7)
Neutrophils Relative %: 71 %
Platelets: 230 10*3/uL (ref 150–400)
RBC: 4.7 MIL/uL (ref 3.87–5.11)
RDW: 13.2 % (ref 11.5–15.5)
WBC: 7.5 10*3/uL (ref 4.0–10.5)
nRBC: 0 % (ref 0.0–0.2)

## 2024-03-10 LAB — COMPREHENSIVE METABOLIC PANEL WITH GFR
ALT: 22 U/L (ref 0–44)
AST: 23 U/L (ref 15–41)
Albumin: 3.6 g/dL (ref 3.5–5.0)
Alkaline Phosphatase: 68 U/L (ref 38–126)
Anion gap: 9 (ref 5–15)
BUN: 18 mg/dL (ref 8–23)
CO2: 31 mmol/L (ref 22–32)
Calcium: 9.2 mg/dL (ref 8.9–10.3)
Chloride: 88 mmol/L — ABNORMAL LOW (ref 98–111)
Creatinine, Ser: 1.06 mg/dL — ABNORMAL HIGH (ref 0.44–1.00)
GFR, Estimated: 56 mL/min — ABNORMAL LOW (ref >60)
Glucose, Bld: 106 mg/dL — ABNORMAL HIGH (ref 70–99)
Potassium: 3.5 mmol/L (ref 3.5–5.1)
Sodium: 128 mmol/L — ABNORMAL LOW (ref 135–145)
Total Bilirubin: 0.9 mg/dL (ref 0.0–1.2)
Total Protein: 6.3 g/dL — ABNORMAL LOW (ref 6.5–8.1)

## 2024-03-10 LAB — PROTIME-INR
INR: 0.9 (ref 0.8–1.2)
INR: 0.9 (ref 0.8–1.2)
Prothrombin Time: 12.4 s (ref 11.4–15.2)
Prothrombin Time: 12.8 s (ref 11.4–15.2)

## 2024-03-10 MED ORDER — CARVEDILOL 12.5 MG PO TABS
12.5000 mg | ORAL_TABLET | Freq: Two times a day (BID) | ORAL | Status: DC
  Administered 2024-03-10 – 2024-03-11 (×2): 12.5 mg via ORAL
  Filled 2024-03-10 (×2): qty 1

## 2024-03-10 MED ORDER — ACETAMINOPHEN 650 MG RE SUPP: 650.0000 mg | Freq: Four times a day (QID) | RECTAL | Status: DC | PRN

## 2024-03-10 MED ORDER — SODIUM CHLORIDE 0.9 % IV SOLN: 250.0000 mL | INTRAVENOUS | Status: DC | PRN

## 2024-03-10 MED ORDER — OYSTER SHELL CALCIUM/D3 500-5 MG-MCG PO TABS
2.0000 | ORAL_TABLET | Freq: Every day | ORAL | Status: DC
  Administered 2024-03-11: 2 via ORAL
  Filled 2024-03-10: qty 2

## 2024-03-10 MED ORDER — MAGNESIUM 200 MG PO TABS: ORAL_TABLET | Freq: Every day | ORAL | Status: DC

## 2024-03-10 MED ORDER — LOSARTAN POTASSIUM-HCTZ 100-25 MG PO TABS: 1.0000 | ORAL_TABLET | Freq: Every day | ORAL | Status: DC

## 2024-03-10 MED ORDER — ACETAMINOPHEN 325 MG PO TABS
650.0000 mg | ORAL_TABLET | Freq: Four times a day (QID) | ORAL | Status: DC | PRN
  Administered 2024-03-10 – 2024-03-11 (×2): 650 mg via ORAL
  Filled 2024-03-10 (×2): qty 2

## 2024-03-10 MED ORDER — PROSIGHT PO TABS
1.0000 | ORAL_TABLET | Freq: Every day | ORAL | Status: DC
  Administered 2024-03-11: 1 via ORAL
  Filled 2024-03-10: qty 1

## 2024-03-10 MED ORDER — HYDROMORPHONE HCL 1 MG/ML IJ SOLN: 0.5000 mg | INTRAMUSCULAR | Status: DC | PRN

## 2024-03-10 MED ORDER — VITAMIN D 25 MCG (1000 UNIT) PO TABS
5000.0000 [IU] | ORAL_TABLET | Freq: Every day | ORAL | Status: DC
  Administered 2024-03-11: 5000 [IU] via ORAL
  Filled 2024-03-10: qty 5

## 2024-03-10 MED ORDER — HYDROCODONE-ACETAMINOPHEN 5-325 MG PO TABS
1.0000 | ORAL_TABLET | ORAL | Status: DC | PRN
  Administered 2024-03-10: 2 via ORAL
  Filled 2024-03-10: qty 2

## 2024-03-10 MED ORDER — LAMOTRIGINE ER 50 MG PO TB24
150.0000 mg | ORAL_TABLET | Freq: Every day | ORAL | Status: DC
  Administered 2024-03-10: 150 mg via ORAL
  Filled 2024-03-10 (×2): qty 3

## 2024-03-10 MED ORDER — BUPROPION HCL ER (XL) 150 MG PO TB24
300.0000 mg | ORAL_TABLET | Freq: Every day | ORAL | Status: DC
  Administered 2024-03-11: 300 mg via ORAL
  Filled 2024-03-10: qty 2

## 2024-03-10 MED ORDER — MAGNESIUM OXIDE -MG SUPPLEMENT 400 (240 MG) MG PO TABS: 400.0000 mg | ORAL_TABLET | Freq: Every day | ORAL | Status: DC

## 2024-03-10 MED ORDER — HYDROCHLOROTHIAZIDE 25 MG PO TABS
25.0000 mg | ORAL_TABLET | Freq: Every day | ORAL | Status: DC
  Administered 2024-03-11: 25 mg via ORAL
  Filled 2024-03-10: qty 1

## 2024-03-10 MED ORDER — SODIUM CHLORIDE 0.9% FLUSH: 3.0000 mL | INTRAVENOUS | Status: DC | PRN

## 2024-03-10 MED ORDER — LOSARTAN POTASSIUM 50 MG PO TABS
100.0000 mg | ORAL_TABLET | Freq: Every day | ORAL | Status: DC
  Administered 2024-03-11: 100 mg via ORAL
  Filled 2024-03-10: qty 2

## 2024-03-10 MED ORDER — ESCITALOPRAM OXALATE 10 MG PO TABS
5.0000 mg | ORAL_TABLET | Freq: Every day | ORAL | Status: DC
  Administered 2024-03-10 – 2024-03-11 (×2): 5 mg via ORAL
  Filled 2024-03-10 (×2): qty 1

## 2024-03-10 MED ORDER — SODIUM CHLORIDE 0.9% FLUSH
3.0000 mL | Freq: Two times a day (BID) | INTRAVENOUS | Status: DC
Start: 2024-03-10 — End: 2024-03-11
  Administered 2024-03-10 – 2024-03-11 (×3): 3 mL via INTRAVENOUS

## 2024-03-10 MED ORDER — METOPROLOL SUCCINATE ER 25 MG PO TB24: 25.0000 mg | ORAL_TABLET | Freq: Every day | ORAL | Status: DC

## 2024-03-10 MED ORDER — ONDANSETRON HCL 4 MG/2ML IJ SOLN
4.0000 mg | Freq: Four times a day (QID) | INTRAMUSCULAR | Status: DC | PRN
Start: 2024-03-10 — End: 2024-03-11

## 2024-03-10 MED ORDER — IRON (FERROUS SULFATE) 325 (65 FE) MG PO TABS: 325.0000 mg | ORAL_TABLET | Freq: Every day | ORAL | Status: DC

## 2024-03-10 MED ORDER — ACETAMINOPHEN 325 MG PO TABS
650.0000 mg | ORAL_TABLET | Freq: Once | ORAL | Status: AC
  Administered 2024-03-10: 650 mg via ORAL
  Filled 2024-03-10: qty 2

## 2024-03-10 MED ORDER — ALPRAZOLAM 0.25 MG PO TABS
2.0000 mg | ORAL_TABLET | Freq: Every evening | ORAL | Status: DC | PRN
  Administered 2024-03-11: 2 mg via ORAL
  Filled 2024-03-10: qty 8

## 2024-03-10 MED ORDER — ROSUVASTATIN CALCIUM 20 MG PO TABS
20.0000 mg | ORAL_TABLET | Freq: Every day | ORAL | Status: DC
  Administered 2024-03-10: 20 mg via ORAL
  Filled 2024-03-10: qty 1

## 2024-03-10 MED ORDER — ONDANSETRON HCL 4 MG PO TABS
4.0000 mg | ORAL_TABLET | Freq: Four times a day (QID) | ORAL | Status: DC | PRN
  Administered 2024-03-11: 4 mg via ORAL
  Filled 2024-03-10: qty 1

## 2024-03-10 NOTE — ED Notes (Signed)
 Pt complains of headache 5/10 requesting tylenol . Pt ambulatory to bathroom.

## 2024-03-10 NOTE — ED Notes (Signed)
 Received report from trauma RN.  Pt resting, chatting with pharmacy. Denies pain.  Placed on the monitor. Call bell within reach.

## 2024-03-10 NOTE — Progress Notes (Addendum)
 Pt assessed in bed and is a recent transfer from the ED for fall and brain bleed. Pt educated about fall precautions. Bed alarm is on, floor mats on the floor, yellow fall bractlet in place. Floor mats are down. No acute distress at this time other than 7/10 headache. RN with treat headache as orders allow.   Pt assisted to the bathroom with standby assist. Pt provided nonskid socks. Pt had 1 cont urine, clear, and yellow.   Headache and neck pain improved with norco, Pt is now being transported with transport for head CT   Pt returned from CT. No acute distress overnight.

## 2024-03-10 NOTE — ED Notes (Signed)
 Patient transported to CT

## 2024-03-10 NOTE — ED Notes (Signed)
 IV secured w/ coband for POV transfer to [facility] ED for CT

## 2024-03-10 NOTE — H&P (Signed)
 [name] [name] is an 73 y.o. female.   Chief Complaint: Headache HPI: 73 year old female status post mechanical fall at home.  2 days ago.  Patient with persistent headache.  No numbness paresthesias or weakness.  No history of seizure.  No issues with cognition or speech.  Patient takes aspirin  for history of TIAs.  No complaints of other injury.  Past Medical History:  Diagnosis Date   Anemia    Arthritis    knees, hips, possibly back (04/08/2016)   Atrial septal aneurysm    B12 deficiency    a. following gastric bypass.   Back pain    Cancer of kidney (HCC)    Cataract    Chronic lower back pain    spondylosis   Cold feet    Daily headache    short ones for the last month or 2 (04/08/2016)   Depression    Diabetes (HCC)    Dry mouth    DVT (deep venous thrombosis) (HCC)    Easy bruising    Floaters in visual field    GERD (gastroesophageal reflux disease)    takes Protonix  daily just to protect my stomach; not for reflux (04/08/2016)   Hay fever    Heart murmur    dx'd by 1 anesthesiologist years and years ago   History of loop recorder 03/2016   Hyperlipidemia    Hypertension    Joint pain    Leg cramp    Macular degeneration    just watching   Neuropathy    Nosebleed    Palpitations    Peripheral edema    Peripheral neuropathy    not on any meds   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   Pneumonia 12/2017   Sleep apnea    no CPAP since weight loss after bariatric surgery '12 (04/08/2016)   Spondylosis    Stroke (HCC) 04/07/2016   a. 01/2016 and 03/2016. during admission had + LE DVT.   Swelling of extremity    TIA (transient ischemic attack)    Trouble in sleeping    Urinary incontinence    Weakness     Past Surgical History:  Procedure Laterality Date   ABDOMINOPLASTY     BRACHIOPLASTY     and more tissue removed during a second surgery   CATARACT EXTRACTION W/ INTRAOCULAR LENS  IMPLANT, BILATERAL Bilateral ~ 2012    COLONOSCOPY     EP IMPLANTABLE DEVICE N/A 04/10/2016   Procedure: Loop Recorder Insertion;  Surgeon: Will Cortland Ding, MD;  Location: [facility];  Service: Cardiovascular;  Laterality: N/A;   ESOPHAGOGASTRODUODENOSCOPY     in the 70's   FACIAL COSMETIC SURGERY     JOINT REPLACEMENT     MASTOPEXY     NEPHRECTOMY  03/2021   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   SINUS ENDO W/FUSION  02/28/2016   Procedure: ENDOSCOPIC SINUS SURGERY WITH NAVIGATION;  Surgeon: Littie Rife, MD;  Location: [facility] OR;  Service: ENT;;   TEE WITHOUT CARDIOVERSION N/A 04/10/2016   Procedure: TRANSESOPHAGEAL ECHOCARDIOGRAM (TEE) POSSIBLE LOOP;  Surgeon: Luana Rumple, MD;  Location: [facility] ENDOSCOPY;  Service: Cardiovascular;  Laterality: N/A;   TONSILLECTOMY  1957   TOTAL HIP ARTHROPLASTY Right 2008   TOTAL HIP REVISION Right 03/30/2016   Procedure: RIGHT TOTAL HIP REVISION;  Surgeon: Wendolyn Hamburger, MD;  Location: [facility] OR;  Service: Orthopedics;  Laterality: Right;   TOTAL KNEE ARTHROPLASTY Bilateral 2001   VAGINAL HYSTERECTOMY  left my ovaries    Family History  Problem Relation Age of Onset   Neuropathy Mother    Hypertension Mother    Hyperlipidemia Mother    Thyroid  disease Mother    Prostate cancer Father    Lymphoma Father    Hypertension Father    Hyperlipidemia Father    Cancer Father    Obesity Father    Brain cancer Father    Congestive Heart Failure Maternal Grandmother    Lung cancer Maternal Grandfather    Stomach cancer Neg Hx    Colon cancer Neg Hx    Esophageal cancer Neg Hx    Pancreatic cancer Neg Hx    Social History:  reports that she quit smoking about 17 years ago. Her smoking use included cigarettes. She started smoking about 57 years ago. She has a 40 pack-year smoking history. She has never used smokeless tobacco. She reports current alcohol use of about 2.0 standard drinks of alcohol per week. She reports that she does not use drugs.  Allergies:  Allergies   Allergen Reactions   Ambien [Zolpidem] Other (See Comments)    NIGHT TERRORS   Atorvastatin  Other (See Comments)    Muscle weakness Muscle Aches   Codeine Nausea And Vomiting   Penicillins Itching and Other (See Comments)    Made nose run uncontrollably Has patient had a PCN reaction causing immediate rash, facial/tongue/throat swelling, SOB or lightheadedness with hypotension: No Has patient had a PCN reaction causing severe rash involving mucus membranes or skin necrosis: No Has patient had a PCN reaction that required hospitalization No Has patient had a PCN reaction occurring within the last 10 years: No If all of the above answers are NO, then may proceed with Cephalosporin use.   Simvastatin Other (See Comments)    Muscle weakness   Meclizine      Felt like her heart was pounding, made me crazy, didn't help the dizziness   Meclizine  Hcl     Other reaction(s): jittery, heart racing   Other Other (See Comments)   Byetta 10 Mcg Pen [Exenatide] Nausea Only    (Not in a hospital admission)   Results for orders placed or performed during the hospital encounter of 03/10/24 (from the past 48 hours)  CBC with Differential     Status: None   Collection Time: 03/10/24 12:30 PM  Result Value Ref Range   WBC 7.5 4.0 - 10.5 K/uL   RBC 4.70 3.87 - 5.11 MIL/uL   Hemoglobin 14.5 12.0 - 15.0 g/dL   HCT 21.3 08.6 - 57.8 %   MCV 89.1 80.0 - 100.0 fL   MCH 30.9 26.0 - 34.0 pg   MCHC 34.6 30.0 - 36.0 g/dL   RDW 46.9 62.9 - 52.8 %   Platelets 230 150 - 400 K/uL   nRBC 0.0 0.0 - 0.2 %   Neutrophils Relative % 71 %   Neutro Abs 5.3 1.7 - 7.7 K/uL   Lymphocytes Relative 19 %   Lymphs Abs 1.4 0.7 - 4.0 K/uL   Monocytes Relative 8 %   Monocytes Absolute 0.6 0.1 - 1.0 K/uL   Eosinophils Relative 1 %   Eosinophils Absolute 0.1 0.0 - 0.5 K/uL   Basophils Relative 1 %   Basophils Absolute 0.1 0.0 - 0.1 K/uL   Immature Granulocytes 0 %   Abs Immature Granulocytes 0.02 0.00 - 0.07 K/uL     Comment: Performed at [facility], [address], [place], [place] [address]  Protime-INR  Status: None   Collection Time: 03/10/24 12:30 PM  Result Value Ref Range   Prothrombin Time 12.4 11.4 - 15.2 seconds    Comment: SPECIMEN HEMOLYZED. HEMOLYSIS MAY AFFECT INTEGRITY OF RESULTS. SPOKE TO PAIGE PULLIAM, RN    INR 0.9 0.8 - 1.2    Comment: (NOTE) INR goal varies based on device and disease states. Performed at [facility], [address], [place], [place] [address]    CT Head Wo Contrast Addendum Date: 03/10/2024 ADDENDUM REPORT: 03/10/2024 12:42 ADDENDUM: Study discussed by telephone with PA LESLIE SOFIA on 03/10/2024 at 1221 hours. Electronically Signed   By: Marlise Simpers M.D.   On: 03/10/2024 12:42   Result Date: 03/10/2024 CLINICAL DATA:  73 year old female status post fall with loss of consciousness yesterday. Subsequent headache, blurred vision, pain. EXAM: CT HEAD WITHOUT CONTRAST TECHNIQUE: Contiguous axial images were obtained from the base of the skull through the vertex without intravenous contrast. RADIATION DOSE REDUCTION: This exam was performed according to the departmental dose-optimization program which includes automated exposure control, adjustment of the mA and/or kV according to patient size and/or use of iterative reconstruction technique. COMPARISON:  Head CT 05/21/2023.  Brain MRI 05/09/2022. FINDINGS: Brain: Acute multifocal intracranial hemorrhage which appears to be posttraumatic. See details below. No ventriculomegaly. Basilar cisterns remain patent. Patchy and confluent bilateral cerebral white matter hypodensity superimposed and stable. No cortically based acute infarct identified. Vascular: Calcified atherosclerosis at the skull base. No suspicious intracranial vascular hyperdensity. Skull: Stable and intact.  No acute osseous abnormality identified. Sinuses/Orbits: Chronic paranasal sinus mucoperiosteal thickening is stable from last year, sinuses,  tympanic cavities and mastoids remain well aerated. Other: No acute orbit or scalp soft tissue injury identified. Traumatic Brain Injury Risk Stratification Skull Fracture: No - Low/mBIG 1 Subdural Hematoma (SDH): 4mm to <24mm - mBIG 2 Small bilateral low to mixed density SDH visible at the vertex (coronal image 31), up to 4 mm on the left and smaller on the right. Subarachnoid Hemorrhage [facility]): multifocal, bilateral - High/mBIG 3 Trace at the posterior right sylvian fissure, but additional bilateral globular subarachnoid blood over both anterior superior frontal gyri (on the left coronal image 55). Epidural Hematoma (EDH): No - Low/mBIG 1 Cerebral contusion, intra-axial, intraparenchymal Hemorrhage (IPH): 8mm plus or multiple - High/mBIG 3 Numerous anterior frontal lobe white matter and gray-white junction subcentimeter hemorrhages, occasional on the left also (series 3, image 15) and 1.7 cm right posterior temporal lobe hemorrhagic contusion. Intraventricular Hemorrhage (IVH): No - Low/mBIG 1 Midline Shift > 1mm or Edema/effacement of sulci/vents: Yes - High/mBIG 3 Rightward midline shift of 3-4 mm. ---------------------------------------------------- IMPRESSION: 1. Positive for multifocal posttraumatic Acute Intracranial Hemorrhage: - multifocal bilateral intraparenchymal hemorrhages (appear to be a combination of shear hemorrhages and hemorrhagic contusion) - small volume bilateral SAH. - small bilateral vertex Subdural Hematomas, up to 4 mm on the left. 2. Rightward midline shift of 3-4 mm.  Normal basilar cisterns. 3. No skull fracture identified. Electronically Signed: By: Marlise Simpers M.D. On: 03/10/2024 12:14   CT Cervical Spine Wo Contrast Result Date: 03/10/2024 CLINICAL DATA:  73 year old female status post fall with loss of consciousness yesterday. Subsequent headache, blurred vision, pain. EXAM: CT CERVICAL SPINE WITHOUT CONTRAST TECHNIQUE: Multidetector CT imaging of the cervical spine was performed  without intravenous contrast. Multiplanar CT image reconstructions were also generated. RADIATION DOSE REDUCTION: This exam was performed according to the departmental dose-optimization program which includes automated exposure control, adjustment of the mA and/or kV according to patient  size and/or use of iterative reconstruction technique. COMPARISON:  Head CT today.  Cervical spine MRI 11/12/2022. FINDINGS: Alignment: Straightening of cervical lordosis since last year. Cervicothoracic junction alignment is within normal limits. Bilateral posterior element alignment is within normal limits. Skull base and vertebrae: Bone mineralization is within normal limits for age. Visualized skull base is intact. No atlanto-occipital dissociation. C1 and C2 appear intact and aligned. No acute osseous abnormality identified. Soft tissues and spinal canal: No prevertebral fluid or swelling. No visible canal hematoma. Negative visible noncontrast neck soft tissues aside from calcified carotid atherosclerosis. Disc levels: Chronic disc and endplate degeneration is advanced C3-C4 through C6-C7, including vacuum disc. Stable by CT from the MRI last year. Upper chest: Visible upper thoracic levels appear intact, negative visible noncontrast thoracic inlet. IMPRESSION: 1. No acute traumatic injury identified in the cervical spine. 2. 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 Electronically Signed   By: Marlise Simpers M.D.   On: 03/10/2024 12:17    Pertinent items noted in HPI and remainder of comprehensive ROS otherwise negative.  Blood pressure (!) 182/81, pulse 65, temperature (!) 97.5 F (36.4 C), temperature source Temporal, resp. rate 18, height 5' 3 (1.6 m), weight 78 kg, SpO2 100%.  Awake and alert.  Oriented and appropriate.  Speech is fluent.  Judgment insight are intact.  Cranial nerve function normal bilateral.  Motor examination 5/5 bilateral.  Sensory examination intact.  Deep and reflexes normal  active.  No with long track signs.  Examination head ears nose and throat demonstrates no evidence of obvious trauma.  No evidence of bony abnormality.  Oropharynx nasopharynx and external auditory canals are clear.  Chest and abdomen are benign.  Extremities free from injury or deformity. Assessment/Plan Status post mechanical fall with traumatic brain injury.  Patient with multifocal cerebral contusions none of which are of significant size or causing any significant mass effect.  Plan for admission with observation.  Plan follow-up head CT scan tomorrow.  If stable patient may be discharged home.  Awilda Bogus Kamyah Wilhelmsen 03/10/2024, 2:36 PM

## 2024-03-10 NOTE — ED Provider Notes (Signed)
 Patient fell yesterday and struck her head.  Patient complains of a headache.  Patient was seen by Dr. Gordon Latus at [facility].  The CT scanner there was broken so she was sent here for a CT of her head and her neck.  CT head shows   Subdural Hematoma (SDH): 4mm to <1mm - mBIG 2    Small bilateral low to mixed density SDH visible at the vertex  (coronal image 31), up to 4 mm on the left and smaller on the right.    Subarachnoid Hemorrhage [facility]): multifocal, bilateral - High/mBIG 3    Trace at the posterior right sylvian fissure, but additional  bilateral globular subarachnoid blood over both anterior superior  frontal gyri (on the left coronal image 55).    Epidural Hematoma (EDH): No - Low/mBIG 1    Cerebral contusion, intra-axial, intraparenchymal Hemorrhage (IPH):  8mm plus or multiple - High/mBIG 3    Numerous anterior frontal lobe white matter and gray-white junction  subcentimeter hemorrhages, occasional on the left also (series 3,  image 15) and 1.7 cm right posterior temporal lobe hemorrhagic  contusion.    Intraventricular Hemorrhage (IVH): No - Low/mBIG 1    Midline Shift > 1mm or Edema/effacement of sulci/vents: Yes -  High/mBIG 3    Rightward midline shift of 3-4 mm.    ----------------------------------------------------    IMPRESSION:  1. Positive for multifocal posttraumatic Acute Intracranial  Hemorrhage:  - multifocal bilateral intraparenchymal hemorrhages (appear to be a  combination of shear hemorrhages and hemorrhagic contusion)  - small volume bilateral SAH.  - small bilateral vertex Subdural Hematomas, up to 4 mm on the left.  2. Rightward midline shift of 3-4 mm.  Normal basilar cisterns.  3. No skull fracture identified.      Electronically Signed    By: Marlise Simpers M.D.    On: 03/10/2024 12:14   Neurosurgery Consulted:  Dr. Gwendlyn Lemmings will admit for observation     Evelyn Hire 03/10/24 [id number]

## 2024-03-10 NOTE — ED Notes (Signed)
 Pt ambulatory to the bathroom without any issues.

## 2024-03-10 NOTE — ED Triage Notes (Signed)
 Pt reports mechanical fall with loss of consciousness yesterday at approx 2p at home, headache, blurred vision, and R shoulder pain since.

## 2024-03-10 NOTE — ED Notes (Signed)
 Pt states tylenol  has helped pain. 4/10

## 2024-03-10 NOTE — ED Notes (Signed)
 Pt given something to eat. Denies further needs. Awaiting meds to be verified. Call bell within reach.

## 2024-03-10 NOTE — ED Provider Notes (Addendum)
 [place] EMERGENCY DEPARTMENT AT [facility] Provider Note   CSN: [id number] Arrival date & time: 03/10/24  4098     Patient presents with: Head Injury   [name] [name] is a 73 y.o. female presented to be with a mechanical fall yesterday, tripping over the edge of a door stoop, and landing and striking her head on the ground.  Possible brief loss of consciousness.  She is not on anticoagulation.  She reports she has had a headache since then, some intermittent blurred vision, mild nausea.  No vomiting.  She report some soreness in her right shoulder and her right hip but is able to ambulate perform range of motion testing.  Denies prior history of concussion.  Her PCP sent her into the ED for further evaluation  Patient does have a history of chronic hyponatremia and had a sodium level of 128 in April per external records.  She reports she has since then had her sodium rechecked and it was above 130.  HPI     Prior to Admission medications   Medication Sig Start Date End Date Taking? Authorizing Provider  alprazolam  (XANAX ) 2 MG tablet Take 1 tablet (2 mg total) by mouth at bedtime as needed for sleep. 02/22/24   Glory Larsen, MD  buPROPion  (WELLBUTRIN  XL) 300 MG 24 hr tablet Take 1 tablet (300 mg total) by mouth daily. 02/10/23   Whitmire, Dawn, FNP  capsaicin  topical system (QUTENZA , 4 PATCH,) 8 % FOR OFFICE ADMINISTRATION BY YOUR PHYSICIAN 07/26/23   Glory Larsen, MD  carvedilol (COREG) 12.5 MG tablet 1 tablet with food Orally Twice a day 05/11/23   [provider]  escitalopram  (LEXAPRO ) 5 MG tablet Take 1 tablet (5 mg total) by mouth daily. 05/25/23   Whitmire, Dawn, FNP  gabapentin  (NEURONTIN ) 300 MG capsule TAKE 2 CAPSULES 3 TIMES A  DAY 05/26/23   Glory Larsen, MD  Iron , Ferrous Sulfate , 325 (65 Fe) MG TABS Take 325 mg by mouth daily. 02/10/23   Whitmire, Dawn, FNP  lamoTRIgine  (LAMICTAL  XR) 50 MG 24 hour tablet Please increase your Lamictal  dose from 100mg   to 150mg  a day by adding a 50mg  pill to your current 100mg  pill. In 2 weeks, if no side effects, may increase to 200mg  daily by adding two 50mg  pills to your current 100mg  pill. Stop for any side effects especially rash. 03/01/24   Glory Larsen, MD  lidocaine -prilocaine  (EMLA ) cream APPLY TOPICALLY 1 APPLICATION AS NEEDED 04/21/23   Glory Larsen, MD  losartan -hydrochlorothiazide  (HYZAAR) 100-25 MG tablet Take 1 tablet by mouth daily. 03/18/22   Whitmire, Dawn, FNP  magnesium  oxide (MAG-OX) 400 MG tablet Take 400 mg by mouth at bedtime.     [provider]  methylPREDNISolone  (MEDROL  DOSEPAK) 4 MG TBPK tablet Take 6 tablets then decrease by 1 tablet daily until gone. 08/09/23   Phebe Brasil, MD  metoprolol succinate (TOPROL-XL) 25 MG 24 hr tablet Take 25 mg by mouth daily.    [provider]  Multiple Vitamins-Minerals (OCUVITE-LUTEIN PO) Take 1 tablet by mouth daily.    [provider]  NONFORMULARY OR COMPOUNDED ITEM Compound Cream 7A (allow alternate if needed for insurance): Amantadine 8%, Baclofen 2%, Gabapentin  6%, Amitriptyline  4%, Bupivacaine  2%, Clonidine 0.2%, Nifedipine 2%.   Instructions: Apply 1-2 grams to the affected area 3-4 times daily. 08/16/23   Glory Larsen, MD  rosuvastatin  (CRESTOR ) 20 MG tablet Take 1 tablet (20 mg total) by mouth at bedtime. 04/13/23  Whitmire, Dawn, FNP  tirzepatide  (MOUNJARO ) 15 MG/0.5ML Pen Inject 15 mg into the skin once a week. 05/25/23   Whitmire, Dawn, FNP  traMADol  (ULTRAM ) 50 MG tablet Take 1-2 tablets (50-100 mg total) by mouth every 6 (six) hours as needed. 400mg  max daily. 02/22/24   Glory Larsen, MD  urea  (CARMOL) 10 % cream APPLY TO AFFECTED AREA EVERY DAY AS NEEDED 10/04/23   Standiford, Karlene Overcast, DPM  XARELTO  20 MG TABS tablet TAKE 1 TABLET DAILY WITH   SUPPER 09/29/22   Glory Larsen, MD    Allergies: Ambien [zolpidem], Atorvastatin , Codeine, Penicillins, Simvastatin, Meclizine , Meclizine  hcl, Other, and  Byetta 10 mcg pen [exenatide]    Review of Systems  Updated Vital Signs BP (!) 186/89   Pulse 71   Temp 97.7 F (36.5 C) (Oral)   Resp 16   SpO2 99%   Physical Exam Constitutional:      General: She is not in acute distress. HENT:     Head: Normocephalic and atraumatic.   Eyes:     Conjunctiva/sclera: Conjunctivae normal.     Pupils: Pupils are equal, round, and reactive to light.    Cardiovascular:     Rate and Rhythm: Normal rate and regular rhythm.  Pulmonary:     Effort: Pulmonary effort is normal. No respiratory distress.  Abdominal:     General: There is no distension.     Tenderness: There is no abdominal tenderness.   Musculoskeletal:     Comments: Paracervical tenderness Full ROM testing of all extremities and pelvis   Skin:    General: Skin is warm and dry.   Neurological:     General: No focal deficit present.     Mental Status: She is alert. Mental status is at baseline.   Psychiatric:        Mood and Affect: Mood normal.        Behavior: Behavior normal.     (all labs ordered are listed, but only abnormal results are displayed) Labs Reviewed - No data to display  EKG: None  Radiology: No results found.   Procedures   Medications Ordered in the ED - No data to display                                  Medical Decision Making Amount and/or Complexity of Data Reviewed Radiology: ordered.   Patient is here with mechanical fall and injury to the head.  She may have concussion type symptoms.  However given her age and reported persistent headache and blurred vision, she is needing neuroimaging to make sure that there is no bleeding.  Unfortunately our CT scanner is broken today at our freestanding ED.  Therefore I have discussed the patient having her friend bring her urgently to [facility], where she can have imaging performed.  She is neurovascularly intact and I think that the most expedited way to get her to the hospital would  be by private car and her friend is comfortable taking her.  The patient is not driving.  They understand the risk of going by private vehicle.    Patient is accepted for transfer by Dr Amalia Jung and Dr Gerard Knight EDP at [facility]  I reviewed external records including patient's blood work from April.  I do not see an indication for emergent repeat blood testing at this time.  EKG per my interpretation shows normal sinus rhythm  with no acute ischemic findings.  Have a low suspicion for arrhythmia or ACS  She is reporting some soreness in her right shoulder and her right hip but I have a very low suspicion clinically for surgical fracture of either side given her clinical exam.     Final diagnoses:  Injury of head, initial encounter    ED Discharge Orders     None         Colton Engdahl, Janalyn Me, MD 03/10/24 [phone]

## 2024-03-11 ENCOUNTER — Observation Stay (HOSPITAL_COMMUNITY)

## 2024-03-11 DIAGNOSIS — G44309 Post-traumatic headache, unspecified, not intractable: Secondary | ICD-10-CM | POA: Diagnosis not present

## 2024-03-11 DIAGNOSIS — S0990XA Unspecified injury of head, initial encounter: Secondary | ICD-10-CM | POA: Diagnosis not present

## 2024-03-11 DIAGNOSIS — S0633AS Contusion and laceration of cerebrum, unspecified, with loss of consciousness status unknown, sequela: Secondary | ICD-10-CM | POA: Diagnosis not present

## 2024-03-11 MED ORDER — HYDROCODONE-ACETAMINOPHEN 5-325 MG PO TABS: 1.0000 | ORAL_TABLET | Freq: Four times a day (QID) | ORAL | 0 refills | Status: DC | PRN

## 2024-03-11 NOTE — Care Management Obs Status (Signed)
 MEDICARE OBSERVATION STATUS NOTIFICATION   Patient Details  Name: [name] [name] MRN: [id number] Date of Birth: [birth year]   Medicare Observation Status Notification Given:  Yes    Jon Cruel 03/11/2024, 1:02 PM

## 2024-03-11 NOTE — Discharge Summary (Signed)
 Physician Discharge Summary     Providing Compassionate, Quality Care - Together   Patient ID: [name] [name] MRN: [id number] DOB/AGE: 73/12/1950 73 y.o.  Admit date: 03/10/2024 Discharge date: 03/11/2024  Admission Diagnoses: Traumatic Brain Injury  Discharge Diagnoses:  Principal Problem:   TBI (traumatic brain injury) [facility])   Discharged Condition: good  Hospital Course: Patient sustained multifocal cerebral contusions following a fall at home. She was admitted to [phone] for observation. Her follow up imaging this morning was stable She is ambulating independently and without difficulty. She is tolerating a normal diet. Her pain is well-controlled with oral pain medication. She is ready for discharge home.   Consults: None  Significant Diagnostic Studies: radiology: CT HEAD WO CONTRAST Result Date: 03/11/2024 CLINICAL DATA:  Traumatic brain injury EXAM: CT HEAD WITHOUT CONTRAST TECHNIQUE: Contiguous axial images were obtained from the base of the skull through the vertex without intravenous contrast. RADIATION DOSE REDUCTION: This exam was performed according to the departmental dose-optimization program which includes automated exposure control, adjustment of the mA and/or kV according to patient size and/or use of iterative reconstruction technique. COMPARISON:  03/10/2024 head CT FINDINGS: Brain: Unchanged bilateral multifocal hemorrhagic intraparenchymal contusions, worst in the right frontal lobe. Small volume subarachnoid hemorrhage in the right sylvian fissure is unchanged. No hydrocephalus or mass effect. Confluent white matter hypoattenuation. Small amount of subdural blood along the right tentorial leaflet is unchanged. Thin left convexity subdural hematoma is unchanged. Vascular: Carotid atherosclerosis Skull: Negative Sinuses/Orbits: No acute finding Other: None. IMPRESSION: 1. Unchanged pattern of traumatic intraparenchymal and extra-axial hemorrhage. 2. No midline shift or  other mass effect.  No hydrocephalus. Electronically Signed   By: Franky Stanford M.D.   On: 03/11/2024 01:35   CT Head Wo Contrast Addendum Date: 03/10/2024 ADDENDUM REPORT: 03/10/2024 12:42 ADDENDUM: Study discussed by telephone with PA LESLIE SOFIA on 03/10/2024 at 1221 hours. Electronically Signed   By: VEAR Hurst M.D.   On: 03/10/2024 12:42   Result Date: 03/10/2024 CLINICAL DATA:  73 year old female status post fall with loss of consciousness yesterday. Subsequent headache, blurred vision, pain. EXAM: CT HEAD WITHOUT CONTRAST TECHNIQUE: Contiguous axial images were obtained from the base of the skull through the vertex without intravenous contrast. RADIATION DOSE REDUCTION: This exam was performed according to the departmental dose-optimization program which includes automated exposure control, adjustment of the mA and/or kV according to patient size and/or use of iterative reconstruction technique. COMPARISON:  Head CT 05/21/2023.  Brain MRI 05/09/2022. FINDINGS: Brain: Acute multifocal intracranial hemorrhage which appears to be posttraumatic. See details below. No ventriculomegaly. Basilar cisterns remain patent. Patchy and confluent bilateral cerebral white matter hypodensity superimposed and stable. No cortically based acute infarct identified. Vascular: Calcified atherosclerosis at the skull base. No suspicious intracranial vascular hyperdensity. Skull: Stable and intact.  No acute osseous abnormality identified. Sinuses/Orbits: Chronic paranasal sinus mucoperiosteal thickening is stable from last year, sinuses, tympanic cavities and mastoids remain well aerated. Other: No acute orbit or scalp soft tissue injury identified. Traumatic Brain Injury Risk Stratification Skull Fracture: No - Low/mBIG 1 Subdural Hematoma (SDH): 4mm to <104mm - mBIG 2 Small bilateral low to mixed density SDH visible at the vertex (coronal image 31), up to 4 mm on the left and smaller on the right. Subarachnoid Hemorrhage [facility]):  multifocal, bilateral - High/mBIG 3 Trace at the posterior right sylvian fissure, but additional bilateral globular subarachnoid blood over both anterior superior frontal gyri (on the left coronal image 55). Epidural Hematoma (EDH): No - Low/mBIG  1 Cerebral contusion, intra-axial, intraparenchymal Hemorrhage (IPH): 8mm plus or multiple - High/mBIG 3 Numerous anterior frontal lobe white matter and gray-white junction subcentimeter hemorrhages, occasional on the left also (series 3, image 15) and 1.7 cm right posterior temporal lobe hemorrhagic contusion. Intraventricular Hemorrhage (IVH): No - Low/mBIG 1 Midline Shift > 1mm or Edema/effacement of sulci/vents: Yes - High/mBIG 3 Rightward midline shift of 3-4 mm. ---------------------------------------------------- IMPRESSION: 1. Positive for multifocal posttraumatic Acute Intracranial Hemorrhage: - multifocal bilateral intraparenchymal hemorrhages (appear to be a combination of shear hemorrhages and hemorrhagic contusion) - small volume bilateral SAH. - small bilateral vertex Subdural Hematomas, up to 4 mm on the left. 2. Rightward midline shift of 3-4 mm.  Normal basilar cisterns. 3. No skull fracture identified. Electronically Signed: By: VEAR Hurst M.D. On: 03/10/2024 12:14   CT Cervical Spine Wo Contrast Result Date: 03/10/2024 CLINICAL DATA:  73 year old female status post fall with loss of consciousness yesterday. Subsequent headache, blurred vision, pain. EXAM: CT CERVICAL SPINE WITHOUT CONTRAST TECHNIQUE: Multidetector CT imaging of the cervical spine was performed without intravenous contrast. Multiplanar CT image reconstructions were also generated. RADIATION DOSE REDUCTION: This exam was performed according to the departmental dose-optimization program which includes automated exposure control, adjustment of the mA and/or kV according to patient size and/or use of iterative reconstruction technique. COMPARISON:  Head CT today.  Cervical spine MRI  11/12/2022. FINDINGS: Alignment: Straightening of cervical lordosis since last year. Cervicothoracic junction alignment is within normal limits. Bilateral posterior element alignment is within normal limits. Skull base and vertebrae: Bone mineralization is within normal limits for age. Visualized skull base is intact. No atlanto-occipital dissociation. C1 and C2 appear intact and aligned. No acute osseous abnormality identified. Soft tissues and spinal canal: No prevertebral fluid or swelling. No visible canal hematoma. Negative visible noncontrast neck soft tissues aside from calcified carotid atherosclerosis. Disc levels: Chronic disc and endplate degeneration is advanced C3-C4 through C6-C7, including vacuum disc. Stable by CT from the MRI last year. Upper chest: Visible upper thoracic levels appear intact, negative visible noncontrast thoracic inlet. IMPRESSION: 1. No acute traumatic injury identified in the cervical spine. 2. 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 Electronically Signed   By: VEAR Hurst M.D.   On: 03/10/2024 12:17     Treatments: Observation  Discharge Exam: Blood pressure 129/75, pulse 67, temperature 98.3 F (36.8 C), resp. rate 19, height 5' 3 (1.6 m), weight 78 kg, SpO2 98%.  Alert and oriented x 4 CN II-XII intact PERRLA CN II-XII grossly intact MAE, Strength and sensation intact    Disposition: Discharge disposition: 01-Home or Self Care       Discharge Instructions     Call MD for:  difficulty breathing, headache or visual disturbances   Complete by: As directed    Call MD for:  hives   Complete by: As directed    Call MD for:  persistant dizziness or light-headedness   Complete by: As directed    Call MD for:  persistant nausea and vomiting   Complete by: As directed    Call MD for:  redness, tenderness, or signs of infection (pain, swelling, redness, odor or green/yellow discharge around incision site)   Complete by: As  directed    Call MD for:  severe uncontrolled pain   Complete by: As directed    Diet - low sodium heart healthy   Complete by: As directed    Increase activity slowly   Complete by: As directed  Allergies as of 03/11/2024       Reactions   Ambien [zolpidem] Other (See Comments)   NIGHT TERRORS   Atorvastatin  Other (See Comments)   Muscle weakness Muscle Aches   Codeine Nausea And Vomiting   Penicillins Itching, Other (See Comments)   Made nose run uncontrollably Has patient had a PCN reaction causing immediate rash, facial/tongue/throat swelling, SOB or lightheadedness with hypotension: No Has patient had a PCN reaction causing severe rash involving mucus membranes or skin necrosis: No Has patient had a PCN reaction that required hospitalization No Has patient had a PCN reaction occurring within the last 10 years: No If all of the above answers are NO, then may proceed with Cephalosporin use.   Simvastatin Other (See Comments)   Muscle weakness   Meclizine     Felt like her heart was pounding, made me crazy, didn't help the dizziness   Meclizine  Hcl    Other reaction(s): jittery, heart racing   Other Other (See Comments)   Byetta 10 Mcg Pen [exenatide] Nausea Only        Medication List     PAUSE taking these medications    aspirin  EC 81 MG tablet Wait to take this until: March 24, 2024 Take 81 mg by mouth at bedtime. Swallow whole.       TAKE these medications    alprazolam  2 MG tablet Commonly known as: Xanax  Take 1 tablet (2 mg total) by mouth at bedtime as needed for sleep.   buPROPion  300 MG 24 hr tablet Commonly known as: Wellbutrin  XL Take 1 tablet (300 mg total) by mouth daily. What changed: when to take this   CALCIUM  1200 PO Take 1 capsule by mouth at bedtime.   carvedilol  12.5 MG tablet Commonly known as: COREG  Take 12.5 mg by mouth 2 (two) times daily with a meal.   escitalopram  5 MG tablet Commonly known as: Lexapro  Take 1  tablet (5 mg total) by mouth daily. What changed: when to take this   gabapentin  300 MG capsule Commonly known as: NEURONTIN  TAKE 2 CAPSULES 3 TIMES A  DAY What changed: See the new instructions.   HYDROcodone -acetaminophen  5-325 MG tablet Commonly known as: NORCO/VICODIN Take 1 tablet by mouth every 6 (six) hours as needed for moderate pain (pain score 4-6).   lamoTRIgine  50 MG 24 hour tablet Commonly known as: LAMICTAL  XR Please increase your Lamictal  dose from 100mg  to 150mg  a day by adding a 50mg  pill to your current 100mg  pill. In 2 weeks, if no side effects, may increase to 200mg  daily by adding two 50mg  pills to your current 100mg  pill. Stop for any side effects especially rash. What changed:  how much to take how to take this when to take this additional instructions   lidocaine -prilocaine  cream Commonly known as: EMLA  APPLY TOPICALLY 1 APPLICATION AS NEEDED What changed: See the new instructions.   losartan -hydrochlorothiazide  100-25 MG tablet Commonly known as: HYZAAR Take 1 tablet by mouth daily. What changed: when to take this   magnesium  oxide 400 MG tablet Commonly known as: MAG-OX Take 400 mg by mouth in the morning.   MAGNESIUM  PO Take 1 capsule by mouth at bedtime. Magtein - Magnesium  L'threonate   metoprolol  succinate 25 MG 24 hr tablet Commonly known as: TOPROL -XL Take 25 mg by mouth daily.   MILK OF MAGNESIA PO Take 1 Dose by mouth as needed.   Mounjaro  15 MG/0.5ML Pen Generic drug: tirzepatide  Inject 15 mg into the skin once a week.  What changed: additional instructions   NONFORMULARY OR COMPOUNDED ITEM Compound Cream 7A (allow alternate if needed for insurance): Amantadine 8%, Baclofen 2%, Gabapentin  6%, Amitriptyline  4%, Bupivacaine  2%, Clonidine 0.2%, Nifedipine 2%.   Instructions: Apply 1-2 grams to the affected area 3-4 times daily. What changed:  how much to take how to take this when to take this reasons to take this    OCUVITE-LUTEIN PO Take 1 tablet by mouth in the morning.   Qutenza  (4 Patch) 8 % Generic drug: capsaicin  topical system FOR OFFICE ADMINISTRATION BY YOUR PHYSICIAN What changed: See the new instructions.   rosuvastatin  20 MG tablet Commonly known as: CRESTOR  Take 1 tablet (20 mg total) by mouth at bedtime.   SUPER B COMPLEX/C PO Take 1 capsule by mouth in the morning.   traMADol  50 MG tablet Commonly known as: ULTRAM  Take 1-2 tablets (50-100 mg total) by mouth every 6 (six) hours as needed. 400mg  max daily.   urea  10 % cream Commonly known as: CARMOL APPLY TO AFFECTED AREA EVERY DAY AS NEEDED What changed: See the new instructions.   Vitamin D3 125 MCG (5000 UT) Caps Take 1 capsule by mouth at bedtime.        Follow-up Information     Claudene Lacks, MD Follow up.   Specialty: Family Medicine Contact information: [address] [place] [address] [phone]                 Signed: Gerard Beck, DNP, AGNP-C Nurse Practitioner  [facility] [address], [address], [place], [place] [address] P: [phone]    F: [phone]  03/11/2024, 10:58 AM

## 2024-03-11 NOTE — Plan of Care (Signed)

## 2024-03-11 NOTE — Progress Notes (Signed)
 Patient alert and oriented, verbalized understanding of dc instructios. All belongings given to patient.

## 2024-03-13 ENCOUNTER — Other Ambulatory Visit: Payer: Self-pay

## 2024-03-13 ENCOUNTER — Encounter (HOSPITAL_COMMUNITY): Payer: Self-pay

## 2024-03-13 ENCOUNTER — Inpatient Hospital Stay (HOSPITAL_COMMUNITY)

## 2024-03-13 ENCOUNTER — Emergency Department (HOSPITAL_COMMUNITY)

## 2024-03-13 ENCOUNTER — Inpatient Hospital Stay (HOSPITAL_COMMUNITY)
Admission: EM | Admit: 2024-03-13 | Discharge: 2024-03-16 | DRG: 640 | Disposition: A | Discharge status: Home Health | Attending: Internal Medicine | Admitting: Internal Medicine

## 2024-03-13 DIAGNOSIS — I10 Essential (primary) hypertension: Secondary | ICD-10-CM | POA: Diagnosis not present

## 2024-03-13 DIAGNOSIS — I129 Hypertensive chronic kidney disease with stage 1 through stage 4 chronic kidney disease, or unspecified chronic kidney disease: Secondary | ICD-10-CM | POA: Diagnosis present

## 2024-03-13 DIAGNOSIS — Z7985 Long-term (current) use of injectable non-insulin antidiabetic drugs: Secondary | ICD-10-CM

## 2024-03-13 DIAGNOSIS — S066XAA Traumatic subarachnoid hemorrhage with loss of consciousness status unknown, initial encounter: Secondary | ICD-10-CM | POA: Diagnosis present

## 2024-03-13 DIAGNOSIS — Z801 Family history of malignant neoplasm of trachea, bronchus and lung: Secondary | ICD-10-CM

## 2024-03-13 DIAGNOSIS — I609 Nontraumatic subarachnoid hemorrhage, unspecified: Secondary | ICD-10-CM

## 2024-03-13 DIAGNOSIS — Z8673 Personal history of transient ischemic attack (TIA), and cerebral infarction without residual deficits: Secondary | ICD-10-CM

## 2024-03-13 DIAGNOSIS — G6289 Other specified polyneuropathies: Secondary | ICD-10-CM | POA: Diagnosis not present

## 2024-03-13 DIAGNOSIS — E785 Hyperlipidemia, unspecified: Secondary | ICD-10-CM | POA: Diagnosis present

## 2024-03-13 DIAGNOSIS — Z85528 Personal history of other malignant neoplasm of kidney: Secondary | ICD-10-CM

## 2024-03-13 DIAGNOSIS — Z79899 Other long term (current) drug therapy: Secondary | ICD-10-CM

## 2024-03-13 DIAGNOSIS — Z808 Family history of malignant neoplasm of other organs or systems: Secondary | ICD-10-CM

## 2024-03-13 DIAGNOSIS — Z905 Acquired absence of kidney: Secondary | ICD-10-CM

## 2024-03-13 DIAGNOSIS — M542 Cervicalgia: Secondary | ICD-10-CM | POA: Diagnosis present

## 2024-03-13 DIAGNOSIS — W19XXXD Unspecified fall, subsequent encounter: Secondary | ICD-10-CM | POA: Diagnosis present

## 2024-03-13 DIAGNOSIS — Z83438 Family history of other disorder of lipoprotein metabolism and other lipidemia: Secondary | ICD-10-CM

## 2024-03-13 DIAGNOSIS — H538 Other visual disturbances: Secondary | ICD-10-CM | POA: Diagnosis present

## 2024-03-13 DIAGNOSIS — Z885 Allergy status to narcotic agent status: Secondary | ICD-10-CM | POA: Diagnosis not present

## 2024-03-13 DIAGNOSIS — E1122 Type 2 diabetes mellitus with diabetic chronic kidney disease: Secondary | ICD-10-CM | POA: Diagnosis present

## 2024-03-13 DIAGNOSIS — Z9884 Bariatric surgery status: Secondary | ICD-10-CM

## 2024-03-13 DIAGNOSIS — Z87891 Personal history of nicotine dependence: Secondary | ICD-10-CM | POA: Diagnosis not present

## 2024-03-13 DIAGNOSIS — Z7982 Long term (current) use of aspirin: Secondary | ICD-10-CM

## 2024-03-13 DIAGNOSIS — Z8249 Family history of ischemic heart disease and other diseases of the circulatory system: Secondary | ICD-10-CM

## 2024-03-13 DIAGNOSIS — E860 Dehydration: Secondary | ICD-10-CM | POA: Diagnosis present

## 2024-03-13 DIAGNOSIS — N1831 Chronic kidney disease, stage 3a: Secondary | ICD-10-CM | POA: Diagnosis present

## 2024-03-13 DIAGNOSIS — R110 Nausea: Secondary | ICD-10-CM | POA: Diagnosis present

## 2024-03-13 DIAGNOSIS — Z96641 Presence of right artificial hip joint: Secondary | ICD-10-CM | POA: Diagnosis present

## 2024-03-13 DIAGNOSIS — H3530 Unspecified macular degeneration: Secondary | ICD-10-CM | POA: Diagnosis present

## 2024-03-13 DIAGNOSIS — Z8042 Family history of malignant neoplasm of prostate: Secondary | ICD-10-CM

## 2024-03-13 DIAGNOSIS — E1142 Type 2 diabetes mellitus with diabetic polyneuropathy: Secondary | ICD-10-CM | POA: Diagnosis present

## 2024-03-13 DIAGNOSIS — S065XAD Traumatic subdural hemorrhage with loss of consciousness status unknown, subsequent encounter: Secondary | ICD-10-CM

## 2024-03-13 DIAGNOSIS — Z888 Allergy status to other drugs, medicaments and biological substances status: Secondary | ICD-10-CM | POA: Diagnosis not present

## 2024-03-13 DIAGNOSIS — F1090 Alcohol use, unspecified, uncomplicated: Secondary | ICD-10-CM | POA: Diagnosis present

## 2024-03-13 DIAGNOSIS — Z8349 Family history of other endocrine, nutritional and metabolic diseases: Secondary | ICD-10-CM

## 2024-03-13 DIAGNOSIS — S065XAA Traumatic subdural hemorrhage with loss of consciousness status unknown, initial encounter: Secondary | ICD-10-CM | POA: Diagnosis present

## 2024-03-13 DIAGNOSIS — Z96653 Presence of artificial knee joint, bilateral: Secondary | ICD-10-CM | POA: Diagnosis present

## 2024-03-13 DIAGNOSIS — K219 Gastro-esophageal reflux disease without esophagitis: Secondary | ICD-10-CM | POA: Diagnosis present

## 2024-03-13 DIAGNOSIS — E1169 Type 2 diabetes mellitus with other specified complication: Secondary | ICD-10-CM | POA: Diagnosis not present

## 2024-03-13 DIAGNOSIS — R112 Nausea with vomiting, unspecified: Secondary | ICD-10-CM | POA: Diagnosis present

## 2024-03-13 DIAGNOSIS — S069XAA Unspecified intracranial injury with loss of consciousness status unknown, initial encounter: Secondary | ICD-10-CM | POA: Diagnosis present

## 2024-03-13 DIAGNOSIS — Z880 Allergy status to penicillin: Secondary | ICD-10-CM | POA: Diagnosis not present

## 2024-03-13 DIAGNOSIS — I629 Nontraumatic intracranial hemorrhage, unspecified: Principal | ICD-10-CM

## 2024-03-13 DIAGNOSIS — E871 Hypo-osmolality and hyponatremia: Principal | ICD-10-CM | POA: Diagnosis present

## 2024-03-13 DIAGNOSIS — Z807 Family history of other malignant neoplasms of lymphoid, hematopoietic and related tissues: Secondary | ICD-10-CM

## 2024-03-13 DIAGNOSIS — S066XAD Traumatic subarachnoid hemorrhage with loss of consciousness status unknown, subsequent encounter: Secondary | ICD-10-CM

## 2024-03-13 LAB — CBG MONITORING, ED: Glucose-Capillary: 110 mg/dL — ABNORMAL HIGH (ref 70–99)

## 2024-03-13 LAB — CBC WITH DIFFERENTIAL/PLATELET
Abs Immature Granulocytes: 0.04 10*3/uL (ref 0.00–0.07)
Basophils Absolute: 0 10*3/uL (ref 0.0–0.1)
Basophils Relative: 0 %
Eosinophils Absolute: 0 10*3/uL (ref 0.0–0.5)
Eosinophils Relative: 0 %
HCT: 40.4 % (ref 36.0–46.0)
Hemoglobin: 14.4 g/dL (ref 12.0–15.0)
Immature Granulocytes: 0 %
Lymphocytes Relative: 8 %
Lymphs Abs: 0.9 10*3/uL (ref 0.7–4.0)
MCH: 31.2 pg (ref 26.0–34.0)
MCHC: 35.6 g/dL (ref 30.0–36.0)
MCV: 87.4 fL (ref 80.0–100.0)
Monocytes Absolute: 0.8 10*3/uL (ref 0.1–1.0)
Monocytes Relative: 7 %
Neutro Abs: 9 10*3/uL — ABNORMAL HIGH (ref 1.7–7.7)
Neutrophils Relative %: 85 %
Platelets: 210 10*3/uL (ref 150–400)
RBC: 4.62 MIL/uL (ref 3.87–5.11)
RDW: 13 % (ref 11.5–15.5)
WBC: 10.8 10*3/uL — ABNORMAL HIGH (ref 4.0–10.5)
nRBC: 0 % (ref 0.0–0.2)

## 2024-03-13 LAB — BASIC METABOLIC PANEL WITH GFR
Anion gap: 15 (ref 5–15)
Anion gap: 12 (ref 5–15)
BUN: 19 mg/dL (ref 8–23)
BUN: 20 mg/dL (ref 8–23)
CO2: 22 mmol/L (ref 22–32)
CO2: 21 mmol/L — ABNORMAL LOW (ref 22–32)
Calcium: 9.2 mg/dL (ref 8.9–10.3)
Calcium: 8.8 mg/dL — ABNORMAL LOW (ref 8.9–10.3)
Chloride: 87 mmol/L — ABNORMAL LOW (ref 98–111)
Chloride: 89 mmol/L — ABNORMAL LOW (ref 98–111)
Creatinine, Ser: 0.94 mg/dL (ref 0.44–1.00)
Creatinine, Ser: 1.03 mg/dL — ABNORMAL HIGH (ref 0.44–1.00)
GFR, Estimated: >60 mL/min (ref >60)
GFR, Estimated: 58 mL/min — ABNORMAL LOW (ref >60)
Glucose, Bld: 103 mg/dL — ABNORMAL HIGH (ref 70–99)
Glucose, Bld: 230 mg/dL — ABNORMAL HIGH (ref 70–99)
Potassium: 3.5 mmol/L (ref 3.5–5.1)
Potassium: 3.4 mmol/L — ABNORMAL LOW (ref 3.5–5.1)
Sodium: 124 mmol/L — ABNORMAL LOW (ref 135–145)
Sodium: 122 mmol/L — ABNORMAL LOW (ref 135–145)

## 2024-03-13 LAB — HEMOGLOBIN A1C
Hgb A1c MFr Bld: 4 % — ABNORMAL LOW (ref 4.8–5.6)
Mean Plasma Glucose: 68.1 mg/dL

## 2024-03-13 LAB — TSH: TSH: 1.218 u[IU]/mL (ref 0.350–4.500)

## 2024-03-13 LAB — GLUCOSE, CAPILLARY
Glucose-Capillary: 267 mg/dL — ABNORMAL HIGH (ref 70–99)
Glucose-Capillary: 103 mg/dL — ABNORMAL HIGH (ref 70–99)

## 2024-03-13 LAB — OSMOLALITY: Osmolality: 263 mosm/kg — ABNORMAL LOW (ref 275–295)

## 2024-03-13 MED ORDER — POLYETHYLENE GLYCOL 3350 17 G PO PACK
17.0000 g | PACK | Freq: Every day | ORAL | Status: DC | PRN
Start: 2024-03-13 — End: 2024-03-14

## 2024-03-13 MED ORDER — ACETAMINOPHEN 325 MG PO TABS: 650.0000 mg | ORAL_TABLET | Freq: Four times a day (QID) | ORAL | Status: DC | PRN

## 2024-03-13 MED ORDER — HYDROCODONE-ACETAMINOPHEN 5-325 MG PO TABS
1.0000 | ORAL_TABLET | Freq: Four times a day (QID) | ORAL | Status: DC | PRN
  Administered 2024-03-13 – 2024-03-15 (×4): 1 via ORAL
  Filled 2024-03-13 (×5): qty 1

## 2024-03-13 MED ORDER — SODIUM CHLORIDE 0.9 % IV SOLN: INTRAVENOUS | Status: DC

## 2024-03-13 MED ORDER — ALPRAZOLAM 0.5 MG PO TABS
0.5000 mg | ORAL_TABLET | Freq: Every evening | ORAL | Status: DC | PRN
  Administered 2024-03-13: 0.5 mg via ORAL
  Filled 2024-03-13 (×2): qty 1

## 2024-03-13 MED ORDER — ONDANSETRON HCL 4 MG/2ML IJ SOLN
4.0000 mg | Freq: Once | INTRAMUSCULAR | Status: AC
  Administered 2024-03-13: 4 mg via INTRAVENOUS
  Filled 2024-03-13: qty 2

## 2024-03-13 MED ORDER — ROSUVASTATIN CALCIUM 20 MG PO TABS
20.0000 mg | ORAL_TABLET | Freq: Every day | ORAL | Status: DC
  Administered 2024-03-13 – 2024-03-15 (×3): 20 mg via ORAL
  Filled 2024-03-13 (×4): qty 1

## 2024-03-13 MED ORDER — ONDANSETRON HCL 4 MG/2ML IJ SOLN
4.0000 mg | Freq: Four times a day (QID) | INTRAMUSCULAR | Status: DC | PRN
Start: 2024-03-13 — End: 2024-03-14

## 2024-03-13 MED ORDER — SODIUM CHLORIDE 0.9 % IV BOLUS
500.0000 mL | Freq: Once | INTRAVENOUS | Status: AC
  Administered 2024-03-13: 500 mL via INTRAVENOUS

## 2024-03-13 MED ORDER — CARVEDILOL 12.5 MG PO TABS
12.5000 mg | ORAL_TABLET | Freq: Two times a day (BID) | ORAL | Status: DC
  Administered 2024-03-13 – 2024-03-16 (×6): 12.5 mg via ORAL
  Filled 2024-03-13 (×8): qty 1

## 2024-03-13 MED ORDER — GABAPENTIN 300 MG PO CAPS
600.0000 mg | ORAL_CAPSULE | Freq: Three times a day (TID) | ORAL | Status: DC
  Administered 2024-03-13 – 2024-03-16 (×9): 600 mg via ORAL
  Filled 2024-03-13 (×13): qty 2

## 2024-03-13 MED ORDER — ACETAMINOPHEN 650 MG RE SUPP: 650.0000 mg | Freq: Four times a day (QID) | RECTAL | Status: DC | PRN

## 2024-03-13 MED ORDER — ONDANSETRON HCL 4 MG PO TABS
4.0000 mg | ORAL_TABLET | Freq: Four times a day (QID) | ORAL | Status: DC | PRN
Start: 2024-03-13 — End: 2024-03-14

## 2024-03-13 MED ORDER — ALBUTEROL SULFATE (2.5 MG/3ML) 0.083% IN NEBU: 2.5000 mg | INHALATION_SOLUTION | RESPIRATORY_TRACT | Status: DC | PRN

## 2024-03-13 MED ORDER — INSULIN ASPART 100 UNIT/ML IJ SOLN: 0.0000 [IU] | Freq: Three times a day (TID) | INTRAMUSCULAR | Status: DC

## 2024-03-13 NOTE — Plan of Care (Signed)
 Hospital at Home Transfer Note   [name] [name] FMW:[id number] DOB: July 30, 1951 DOA: 03/13/2024  PCP: Claudene Lacks, MD      Length of Stay 0  Referred by Attending Raenelle Donalda HERO, MD   Brief Narrative:    74 year old female who presents with headache and nausea. She was seen here last week after she had a fall at home. She was found to have traumatic cerebral contusions and small subarachnoid hemorrhage. She was admitted overnight for observation and did not have any change in her imaging and was discharged on the 21st. She said that she has had some ongoing headaches and it feels a little bit worse today.  Repeat CT no change noted Na down to 124     Current consultants: NONE    Consult Orders  (From admission, onward)           Start     Ordered   03/13/24 1102  Consult to hospitalist  Once       Provider:  (Not yet assigned)  Question Answer Comment  Place call to: [facility]   Reason for Consult Admit      03/13/24 1101              Assessment & Plan:   Principal Problem:   Hyponatremia Active Problems:   CKD stage 3a, GFR 45-59 ml/min (HCC)   Dehydration   Nausea & vomiting   Subarachnoid hemorrhage (HCC)     CKD stage 3a, GFR 45-59 ml/min (HCC)  -chronic avoid nephrotoxic medications such as NSAIDs, Vanco Zosyn combo,  avoid hypotension, continue to follow renal function   Hyponatremia   - order urine electrolytes,  -Frequent labs    Dehydration Will rehydrate  Nausea & vomiting Likely due to post concussive syndrome Supportive managment  Subarachnoid hemorrhage (HCC) Small repeat CT no change Stable No aspirin  or anticoagulation      DVT prophylaxis:  ted hose     Family Communication:   Family   at  Bedside  plan of care was discussed   Sister,   Hospital at Home program has been discussed with family     Strong family support noted    Disposition Plan:    discharged from Hospital at Home program to home  once   patient is stable and no longer qualifies, PCP and consultant follow up to be arranged at the time of DC   Following barriers for discharge:                                                       Electrolytes corrected                                                      Fluid status improved                         Admission status:  ED Disposition     ED Disposition  Admit   Condition  --   Comment  Hospital Area: [facility] [[id number]]  Level of Care: Telemetry Medical [[id number]]  May admit patient to [facility]  or [facility] if equivalent level of care is available:: No  Covid Evaluation: Asymptomatic - no recent exposure (last 10 days) testing not required  Diagnosis: Hyponatremia [801480]  Admitting Physician: RAENELLE DONALDA HERO [[id number]]  Attending Physician: RAENELLE DONALDA HERO [[id number]]  Certification:: I certify this patient will need inpatient services for at least 2 midnights  Expected Medical Readiness: 03/16/2024           inpatient     To be admitted to the [facility] at [facility] program, a patient generally must meet the following:  I Expect 2 midnight stay secondary to severity of patient's current illness need for inpatient interventions justified by the following:   extensive comorbidities including:     DM2   history of stroke with residual deficits   That are currently affecting medical management. I expect  patient to be hospitalized for 2 midnights requiring inpatient medical care. Patient is at high risk for adverse outcome (such as loss of life or disability) if not treated.  Indication for inpatient stay as follows:    inability to maintain adequate oral hydration     Need for    IV fluids    1. Requirement for Inpatient Level of Care: The patient's condition must necessitate an inpatient level of care. This is typically indicated by one or more of the following, depending on their specific diagnosis:    - Need for Intravenous  (IV) hydration due to an inability to maintain oral hydration, which persists despite observation care (e.g., for Cellulitis, UTI, Viral Illness, COVID).   _______________________________________________________________________________________________________  Hospital at Home Admission Criteria Checklist     Patient meets inpatient admission criteria     pt medicare FFS     Patient lives within 95 mil/ 30 min from [facility] within [place](pt may stay with family member during admission who lives within 25 miles or 30 min from [facility] w/in [facility])      Hemodynamically stable with relatively low risk of clinical deterioration-not requiring ICU       Age >18     Does not require frequent touch-points or complex interventions/medications (ie Titrated Infusions (IV insulin , heparin drips, vasoactive drips, use of infused or injectable controlled substances, patients on insulin )?    Any Behavioral Health comorbidities likely to increase risk for in-home care (ie Acute delirium or experiencing a marked altered mental status and cause is not a treatable condition in the home)?    No active safety concerns (ie Unable to use bedside commode independently and lacks caregiver support for safety- needs SNF placement, unable to obtain IV access)?   Common admission diagnoses including: CAP, COPD Exacerbation, Acute on chronic heart failure, Cellulitis, UTI , dehydration, acute resp failure with hypoxia (requiring <5L)   Social Screening:  Denies significant ETOH intake?   Does not smoke and understands may not smoke in the presence of oxygen ?    Patient states able to use iPad/phone for communication/has family who is able to use?    NO active drug use in patient or primary caregiver including daily dosing of methadone?    Stable home environment ( access to appropriate heating in cold conditions and/or appropriate air conditioning in hot conditions and/or no running water /electricity)?    No  aggressive pets at home?   Firearm  Not present   Ambulatory?   (Independently ) No reported presence of Bed bugs     Doesn't need   oxygen    Family support system in place?   Sister,  Patient feels safe at home does not endorse any violence?   No actively decompensated behavioral healthy issues including agitation/aggressive behavior?     Patient requests food to be provided by hospital home program?  no  2. Appropriateness for Hospital at Home Setting: The patient's overall clinical picture, including the severity of their illness, their care needs, and their medical history and comorbidities, must be suitable for management in the Hospital at Home environment. This essentially means that none of the exclusion criteria (listed below) are met.  Unified Exclusion Criteria for Hospital at Home Admission: A patient would not be eligible for Hospital at Home if any of the following are present: - Hemodynamic Instability: - Hypotension (low blood pressure) is present. - Respiratory Instability or Needs Beyond Program Capability: - There is a new need for invasive or noninvasive ventilatory assistance (like BiPAP or a ventilator). - Oxygenation is not sufficient, generally indicated if an FiO2 (fraction of inspired oxygen ) of 45% (which is about 6 Liters/minute via nasal cannula) or more is required to keep oxygen  saturation (SpO2) at 90% or greater. - Monitoring or Procedural Needs Beyond Program Capability: - There is a need for invasive monitoring, such as a pulmonary artery catheter or an arterial line. - There is a need for immediate-response telemetry monitoring (for dangerous arrhythmia detection and subsequent immediate intervention). - The required medication regimen is beyond the capabilities of Hospital at Home (e.g., dosing intervals are too frequent for home administration). - There is a need for a procedure that cannot be performed by the Hospital at [facility] team (e.g.,  significant wound debridement or abscess drainage for cellulitis, or percutaneous nephrostomy for a complicated UTI). - Significant Organ Dysfunction or Markers of Severe Illness: - Mental status is not at baseline, or there is altered mental status suggestive of inadequate perfusion. - Renal (kidney) function is unstable or showing an ongoing decline. - There is evidence of inadequate perfusion, such as metabolic acidosis or myocardial ischemia. - Uncompensated acidosis is present. - Condition-Specific Severity or Complications Making Home Care Unsuitable: -For Heart Failure: Known severe cardiac valvular disease (e.g., aortic stenosis, mitral regurgitation); or severe peripheral edema that impairs the ability to urinate or ambulate. -For COPD: Known concurrent comorbidity or finding that indicates a higher-risk COPD exacerbation (e.g., pulmonary fibrosis, cavitation, pleural effusion, pneumothorax, rib fracture). -For Pneumonia: Pneumonia Severity Index (PSI) class V (indicating high risk for inpatient mortality); known concurrent comorbidity or finding that indicates higher-risk pneumonia (e.g., pulmonary fibrosis, cavitation, large or loculated pleural effusion); or a concomitant serious infectious process like endocarditis or empyema. -For Cellulitis: Orbital, periorbital, or necrotizing infection is suspected; or a concomitant serious infectious process like endocarditis, septic emboli, or septic joint space infection. -For UTI: Urinary tract obstruction (e.g., kidney stone, bladder outlet obstruction); or a concomitant serious infectious process like endocarditis or septic emboli. -For Viral Illness & COVID-19: A concomitant serious infectious process like endocarditis or empyema. General Comorbidities or Status: -The patient is significantly immunosuppressed (this applies to Pneumonia, Cellulitis, UTI, Viral Illness, and COVID-19). -The patient meets inpatient admission  criteria for a second diagnosis, or has care needs beyond the capabilities of Hospital at Home due to an active clinically significant comorbidity. (This is a general exclusion across all listed conditions)  _________________________________________________________________________________   Subjective:    Patient was seen and examined Pleasant stable still has some nausea But IV Zofran  helped Discussed with patient and family hospital at home program they are excited to participate Consent signed see below  Objective: Vitals:   03/13/24 0906 03/13/24 0909 03/13/24 1200  BP:  (!) 178/94 (!) 161/99  Pulse:  81 84  Resp:  20 (!) 21  Temp:  98.4 F (36.9 C)   TempSrc:  Oral   SpO2:  100% 100%  Weight: 80.3 kg    Height: 5' 3 (1.6 m)      Intake/Output Summary (Last 24 hours) at 03/13/2024 1213 Last data filed at 03/13/2024 1126 Gross per 24 hour  Intake 500 ml  Output --  Net 500 ml   Filed Weights   03/13/24 0906  Weight: 80.3 kg    Examination:  General exam: Appears calm and comfortable  Respiratory system: Clear to auscultation. Respiratory effort normal. Cardiovascular system: S1 & S2 heard, RRR. No JVD, murmurs, rubs, gallops or clicks. No pedal edema. Gastrointestinal system: Abdomen is nondistended, soft and nontender. No organomegaly or masses felt. Normal bowel sounds heard. Central nervous system: Alert and oriented. No focal neurological deficits. Extremities: Symmetric 5 x 5 power. Skin: No rashes, lesions or ulcers Psychiatry: Judgement and insight appear normal. Mood & affect appropriate.    Data Reviewed: I have personally reviewed following labs and imaging studies  CBC: Recent Labs  Lab 03/10/24 1230 03/13/24 0957  WBC 7.5 10.8*  NEUTROABS 5.3 9.0*  HGB 14.5 14.4  HCT 41.9 40.4  MCV 89.1 87.4  PLT 230 210    Basic Metabolic Panel: Recent Labs  Lab 03/10/24 1847 03/13/24 0957  NA 128* 124*  K 3.5 3.5  CL 88* 87*  CO2 31 22   GLUCOSE 106* 103*  BUN 18 19  CREATININE 1.06* 0.94  CALCIUM  9.2 9.2    GFR: Estimated Creatinine Clearance: 54.3 mL/min (by C-G formula based on SCr of 0.94 mg/dL).  Liver Function Tests: Recent Labs  Lab 03/10/24 1847  AST 23  ALT 22  ALKPHOS 68  BILITOT 0.9  PROT 6.3*  ALBUMIN 3.6     CT HEAD   NO change      Radiological Exams on Admission: CT Head Wo Contrast Result Date: 03/13/2024 CLINICAL DATA:  Head trauma, moderate-severe recent, ICH, worsening headache EXAM: CT HEAD WITHOUT CONTRAST TECHNIQUE: Contiguous axial images were obtained from the base of the skull through the vertex without intravenous contrast. RADIATION DOSE REDUCTION: This exam was performed according to the departmental dose-optimization program which includes automated exposure control, adjustment of the mA and/or kV according to patient size and/or use of iterative reconstruction technique. COMPARISON:  CTHead March 11, 2024. FINDINGS: Brain: Unchanged multifocal hemorrhagic intraparenchymal contusions, worst in the right frontal lobe. Similar small volume subarachnoid hemorrhage along the right frontal convexity and sylvian fissure. Similar small volume of subdural hemorrhage along the right tentorial leaflet. No midline shift. No hydrocephalus. Similar patchy white matter hypodensities, compatible with chronic microvascular ischemic disease. No evidence of acute large vascular territory infarct. Vascular: No hyperdense vessel. Skull: No acute fracture. Sinuses/Orbits: Left sphenoid sinus mucosal thickening remaining sinuses are clear. No acute orbital findings. Other: No mastoid effusions. IMPRESSION: 1. Unchanged multifocal traumatic intraparenchymal and extra-axial hemorrhage. No midline shift or progressive mass effect. 2. No hydrocephalus. Electronically Signed   By: Gilmore GORMAN Molt M.D.   On: 03/13/2024 10:41   No results found for this or any previous visit (from the past 240 hours).           Scheduled Meds:  botulinum toxin Type A   100 Units Intramuscular Once   botulinum toxin Type A   155 Units Intramuscular Once  botulinum toxin Type A   200 Units Intramuscular Once   botulinum toxin Type A   200 Units Intramuscular Once   botulinum toxin Type A   300 Units Intramuscular Once   Continuous Infusions:   LOS: 0 days    Time spent: 60  min   Blease Quiver, MD [facility]   03/13/2024, 12:23 PM

## 2024-03-13 NOTE — H&P (Addendum)
 History and Physical    Patient: [name] [name] FMW:[id number] DOB: Nov 05, 1950 DOA: 03/13/2024 DOS: the patient was seen and examined on 03/13/2024 PCP: Claudene Lacks, MD  Patient coming from: Home  Chief Complaint:  Chief Complaint  Patient presents with   Headache   Nausea   HPI: [name] [name] is a 73 y.o. female with medical history significant of HTN, DM, CVA, diabetic neuropathy-who was recently hospitalized from 6/20-6/21 under neurosurgical service-after she had a mechanical fall and suffered multifocal contusions resulting in [facility] and SDH.  She was observed overnight and subsequently discharged home.  She presented to the ED today with complaints of severe nausea-mostly retching but no active vomiting.  Per patient and friend at bedside-patient has not had any significant oral intake for the past 2-3 days-apparently the only thing she was able to drink was some Gatorade a few days back.  Her headaches have essentially been the same for the past several days-she also complains of some neck pain.  Along with the symptoms-she has developed right eye blurry vision for the past 2-3 days as well.    While in the ED-she was found to have worsening hyponatremia-repeat CT head did not show any worsening of multifocal traumatic intraparenchymal and extra-axial hemorrhage.  [facility] was asked to admit this patient for further evaluation and treatment.   Review of Systems: As mentioned in the history of present illness. All other systems reviewed and are negative. Past Medical History:  Diagnosis Date   Anemia    Arthritis    knees, hips, possibly back (04/08/2016)   Atrial septal aneurysm    B12 deficiency    a. following gastric bypass.   Back pain    Cancer of kidney (HCC)    Cataract    Chronic lower back pain    spondylosis   Cold feet    Daily headache    short ones for the last month or 2 (04/08/2016)   Depression    Diabetes (HCC)    Dry mouth    DVT (deep venous  thrombosis) (HCC)    Easy bruising    Floaters in visual field    GERD (gastroesophageal reflux disease)    takes Protonix  daily just to protect my stomach; not for reflux (04/08/2016)   Hay fever    Heart murmur    dx'd by 1 anesthesiologist years and years ago   History of loop recorder 03/2016   Hyperlipidemia    Hypertension    Joint pain    Leg cramp    Macular degeneration    just watching   Neuropathy    Nosebleed    Palpitations    Peripheral edema    Peripheral neuropathy    not on any meds   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   Pneumonia 12/2017   Sleep apnea    no CPAP since weight loss after bariatric surgery '12 (04/08/2016)   Spondylosis    Stroke (HCC) 04/07/2016   a. 01/2016 and 03/2016. during admission had + LE DVT.   Swelling of extremity    TIA (transient ischemic attack)    Trouble in sleeping    Urinary incontinence    Weakness    Past Surgical History:  Procedure Laterality Date   ABDOMINOPLASTY     BRACHIOPLASTY     and more tissue removed during a second surgery   CATARACT EXTRACTION W/ INTRAOCULAR LENS  IMPLANT, BILATERAL Bilateral ~ 2012   COLONOSCOPY  EP IMPLANTABLE DEVICE N/A 04/10/2016   Procedure: Loop Recorder Insertion;  Surgeon: Will Gladis Norton, MD;  Location: [facility] INVASIVE CV LAB;  Service: Cardiovascular;  Laterality: N/A;   ESOPHAGOGASTRODUODENOSCOPY     in the 70's   FACIAL COSMETIC SURGERY     JOINT REPLACEMENT     MASTOPEXY     NEPHRECTOMY  03/2021   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   SINUS ENDO W/FUSION  02/28/2016   Procedure: ENDOSCOPIC SINUS SURGERY WITH NAVIGATION;  Surgeon: Merilee Kraft, MD;  Location: [facility] OR;  Service: ENT;;   TEE WITHOUT CARDIOVERSION N/A 04/10/2016   Procedure: TRANSESOPHAGEAL ECHOCARDIOGRAM (TEE) POSSIBLE LOOP;  Surgeon: Jerel Balding, MD;  Location: [facility] ENDOSCOPY;  Service: Cardiovascular;  Laterality: N/A;   TONSILLECTOMY  1957   TOTAL HIP ARTHROPLASTY Right  2008   TOTAL HIP REVISION Right 03/30/2016   Procedure: RIGHT TOTAL HIP REVISION;  Surgeon: Dempsey Sensor, MD;  Location: [facility] OR;  Service: Orthopedics;  Laterality: Right;   TOTAL KNEE ARTHROPLASTY Bilateral 2001   VAGINAL HYSTERECTOMY     left my ovaries   Social History:  reports that she quit smoking about 17 years ago. Her smoking use included cigarettes. She started smoking about 57 years ago. She has a 40 pack-year smoking history. She has never used smokeless tobacco. She reports current alcohol use of about 2.0 standard drinks of alcohol per week. She reports that she does not use drugs.  Allergies  Allergen Reactions   Ambien [Zolpidem] Other (See Comments)    NIGHT TERRORS   Atorvastatin  Other (See Comments)    Muscle weakness Muscle Aches   Codeine Nausea And Vomiting   Penicillins Itching and Other (See Comments)    Made nose run uncontrollably Has patient had a PCN reaction causing immediate rash, facial/tongue/throat swelling, SOB or lightheadedness with hypotension: No Has patient had a PCN reaction causing severe rash involving mucus membranes or skin necrosis: No Has patient had a PCN reaction that required hospitalization No Has patient had a PCN reaction occurring within the last 10 years: No If all of the above answers are NO, then may proceed with Cephalosporin use.   Simvastatin Other (See Comments)    Muscle weakness   Meclizine      Felt like her heart was pounding, made me crazy, didn't help the dizziness   Meclizine  Hcl     Other reaction(s): jittery, heart racing   Other Other (See Comments)   Byetta 10 Mcg Pen [Exenatide] Nausea Only    Family History  Problem Relation Age of Onset   Neuropathy Mother    Hypertension Mother    Hyperlipidemia Mother    Thyroid  disease Mother    Prostate cancer Father    Lymphoma Father    Hypertension Father    Hyperlipidemia Father    Cancer Father    Obesity Father    Brain cancer Father    Congestive  Heart Failure Maternal Grandmother    Lung cancer Maternal Grandfather    Stomach cancer Neg Hx    Colon cancer Neg Hx    Esophageal cancer Neg Hx    Pancreatic cancer Neg Hx     Prior to Admission medications   Medication Sig Start Date End Date Taking? Authorizing Provider  alprazolam  (XANAX ) 2 MG tablet Take 1 tablet (2 mg total) by mouth at bedtime as needed for sleep. Patient not taking: Reported on 03/10/2024 02/22/24   Ines Onetha NOVAK, MD  aspirin  EC 81 MG tablet Take 81 mg  by mouth at bedtime. Swallow whole.    [provider]  buPROPion  (WELLBUTRIN  XL) 300 MG 24 hr tablet Take 1 tablet (300 mg total) by mouth daily. Patient taking differently: Take 300 mg by mouth in the morning. 02/10/23   Whitmire, Dawn, FNP  Calcium  Carbonate-Vit D-Min (CALCIUM  1200 PO) Take 1 capsule by mouth at bedtime.    [provider]  capsaicin  topical system (QUTENZA , 4 PATCH,) 8 % FOR OFFICE ADMINISTRATION BY YOUR PHYSICIAN Patient taking differently: Apply 1 patch topically as needed. 07/26/23   Ines Onetha NOVAK, MD  carvedilol  (COREG ) 12.5 MG tablet Take 12.5 mg by mouth 2 (two) times daily with a meal. 05/11/23   [provider]  Cholecalciferol  (VITAMIN D3) 125 MCG (5000 UT) CAPS Take 1 capsule by mouth at bedtime.    [provider]  escitalopram  (LEXAPRO ) 5 MG tablet Take 1 tablet (5 mg total) by mouth daily. Patient taking differently: Take 5 mg by mouth at bedtime. 05/25/23   Whitmire, Dawn, FNP  gabapentin  (NEURONTIN ) 300 MG capsule TAKE 2 CAPSULES 3 TIMES A  DAY Patient taking differently: Take 600 mg by mouth 3 (three) times daily. 05/26/23   Ines Onetha NOVAK, MD  HYDROcodone -acetaminophen  (NORCO/VICODIN) 5-325 MG tablet Take 1 tablet by mouth every 6 (six) hours as needed for moderate pain (pain score 4-6). 03/11/24   Bergman, Meghan D, NP  lamoTRIgine  (LAMICTAL  XR) 50 MG 24 hour tablet Please increase your Lamictal  dose from 100mg  to 150mg  a day by adding a 50mg   pill to your current 100mg  pill. In 2 weeks, if no side effects, may increase to 200mg  daily by adding two 50mg  pills to your current 100mg  pill. Stop for any side effects especially rash. Patient taking differently: Take 150 mg by mouth at bedtime. 03/01/24   Ines Onetha NOVAK, MD  lidocaine -prilocaine  (EMLA ) cream APPLY TOPICALLY 1 APPLICATION AS NEEDED Patient taking differently: Apply 1 Application topically as needed (foot pains). 04/21/23   Ines Onetha NOVAK, MD  losartan -hydrochlorothiazide  (HYZAAR) 100-25 MG tablet Take 1 tablet by mouth daily. Patient taking differently: Take 1 tablet by mouth in the morning. 03/18/22   Whitmire, Dawn, FNP  Magnesium  Hydroxide (MILK OF MAGNESIA PO) Take 1 Dose by mouth as needed.    [provider]  magnesium  oxide (MAG-OX) 400 MG tablet Take 400 mg by mouth in the morning.    [provider]  MAGNESIUM  PO Take 1 capsule by mouth at bedtime. Magtein - Magnesium  L'threonate    [provider]  metoprolol  succinate (TOPROL -XL) 25 MG 24 hr tablet Take 25 mg by mouth daily. Patient not taking: Reported on 03/10/2024    [provider]  Multiple Vitamins-Minerals (OCUVITE-LUTEIN PO) Take 1 tablet by mouth in the morning.    [provider]  NONFORMULARY OR COMPOUNDED ITEM Compound Cream 7A (allow alternate if needed for insurance): Amantadine 8%, Baclofen 2%, Gabapentin  6%, Amitriptyline  4%, Bupivacaine  2%, Clonidine 0.2%, Nifedipine 2%.   Instructions: Apply 1-2 grams to the affected area 3-4 times daily. Patient taking differently: Apply 1 Application topically as needed. Compound Cream 7A (allow alternate if needed for insurance): Amantadine 8%, Baclofen 2%, Gabapentin  6%, Amitriptyline  4%, Bupivacaine  2%, Clonidine 0.2%, Nifedipine 2%.   Instructions: Apply 1-2 grams to the affected area 3-4 times daily. 08/16/23   Ines Onetha NOVAK, MD  rosuvastatin  (CRESTOR ) 20 MG tablet Take 1 tablet (20 mg total) by mouth at  bedtime. 04/13/23   Whitmire, Dawn, FNP  SUPER B COMPLEX/C PO  Take 1 capsule by mouth in the morning.    [provider]  tirzepatide  (MOUNJARO ) 15 MG/0.5ML Pen Inject 15 mg into the skin once a week. Patient taking differently: Inject 15 mg into the skin once a week. On Friday 05/25/23   Whitmire, Dawn, FNP  traMADol  (ULTRAM ) 50 MG tablet Take 1-2 tablets (50-100 mg total) by mouth every 6 (six) hours as needed. 400mg  max daily. 02/22/24   Ines Onetha NOVAK, MD  urea  (CARMOL) 10 % cream APPLY TO AFFECTED AREA EVERY DAY AS NEEDED Patient taking differently: Apply 1 Application topically as needed. 10/04/23   Malvin Marsa FALCON, DPM    Physical Exam: Vitals:   03/13/24 1215 03/13/24 1230 03/13/24 1245 03/13/24 1257  BP: (!) 159/93 (!) 151/89 (!) 155/92   Pulse: 85 83 80   Resp: 20 12 18    Temp:    98.6 F (37 C)  TempSrc:    Oral  SpO2: 99% 99% 98%   Weight:      Height:        Gen Exam:Alert awake-not in any distress HEENT:atraumatic, normocephalic Chest: B/L clear to auscultation anteriorly CVS:S1S2 regular Abdomen:soft non tender, non distended Extremities:no edema Neurology: Non focal Skin: no rash  Data Reviewed:     Latest Ref Rng & Units 03/13/2024    9:57 AM 03/10/2024   12:30 PM 02/23/2023   11:02 AM  CBC  WBC 4.0 - 10.5 K/uL 10.8  7.5  6.5   Hemoglobin 12.0 - 15.0 g/dL 85.5  85.4  88.9   Hematocrit 36.0 - 46.0 % 40.4  41.9  35.1   Platelets 150 - 400 K/uL 210  230  258        Latest Ref Rng & Units 03/13/2024    9:57 AM 03/10/2024    6:47 PM 02/23/2023   11:02 AM  BMP  Glucose 70 - 99 mg/dL 896  893  69   BUN 8 - 23 mg/dL 19  18  24    Creatinine 0.44 - 1.00 mg/dL 9.05  8.93  8.72   BUN/Creat Ratio 12 - 28   19   Sodium 135 - 145 mmol/L 124  128  126   Potassium 3.5 - 5.1 mmol/L 3.5  3.5  4.3   Chloride 98 - 111 mmol/L 87  88  88   CO2 22 - 32 mmol/L 22  31  23    Calcium  8.9 - 10.3 mg/dL 9.2  9.2  9.7      Assessment and Plan: Hyponatremia Given  the fact that patient has had poor oral intake-has been on HCTZ-I suspect this is presumably from dehydration-however her volume status appears stable.  She is also on numerous medications including Wellbutrin /Lexapro  that could potentially cause hyponatremia as well. Patient has already received 500 cc of NS here in the ED-will repeat labs in a few hours-but suspect if no improvement will require IVF hydration overnight.Will hold Lexapro /Wellbutrin /HCTZ-and follow sodium levels.  Will check urine sodium/urine and serum osmolality. If after IVF hydration overnight-if NA levels do not improve we might have to consider SIADH physiology from psych meds or from recent SAH/SDH. Of note-patient does appear to have some amount of chronic mild hyponatremia at baseline-with sodium ranging from 128-134.  Per prior outpatient nephrology note-this was attributed to excessive free water  intake.  Right eye blurry vision Apparently started after recent fall and traumatic SAH/SDH Probably related to SAH/SDH However given prior history of CVA-will go and order an MRI of the brain.  Recent mechanical fall with traumatic small volume SAH/SDH Repeat CT head shows unchanged changes Supportive care PT/OT eval  Nausea Unclear etiology Repeat CT head reassuring No abdominal pain-abdominal exam is benign ?  Related to narcotics For now-as needed antiemetics and follow-up.  HTN Holding HCTZ/losartan  for now Continue Coreg  and follow BP trend  DM-2 Start SSI On Mounjaro  as an outpatient.  HLD Statin  History of CVA Not on aspirin -presumably secondary to recent SAH/SDH Continue statin PT/OT eval.  Peripheral neuropathy Related to diabetes Continue Neurontin   History of solitary left kidney-s/p right radical nephrectomy July 2022 for renal cell cancer at [facility]    Advance Care Planning:   Code Status: Full Code   Consults: None   Family Communication: Friend at bedside  Severity of Illness: The  appropriate patient status for this patient is OBSERVATION. Observation status is judged to be reasonable and necessary in order to provide the required intensity of service to ensure the patient's safety. The patient's presenting symptoms, physical exam findings, and initial radiographic and laboratory data in the context of their medical condition is felt to place them at decreased risk for further clinical deterioration. Furthermore, it is anticipated that the patient will be medically stable for discharge from the hospital within 2 midnights of admission.   Author: Donalda Applebaum, MD 03/13/2024 1:43 PM  For on call review [email or web address].

## 2024-03-13 NOTE — Assessment & Plan Note (Signed)
Will rehydrate 

## 2024-03-13 NOTE — ED Provider Notes (Signed)
 [place] EMERGENCY DEPARTMENT AT [facility] Provider Note   CSN: [id number] Arrival date & time: 03/13/24  9145     Patient presents with: Headache and Nausea   Roneisha A Betsill is a 73 y.o. female.   Patient is a 74 year old female who presents with headache and nausea.  She was seen here last week after she had a fall at home.  She was found to have traumatic cerebral contusions and small subarachnoid hemorrhage.  She was admitted overnight for observation and did not have any change in her imaging and was discharged on the 21st.  She said that she has had some ongoing headaches and it feels a little bit worse today.  She has been using Tylenol  without much relief.  She has also had some ongoing nausea and decreased appetite.  She has not really found anything that she can eat or drink without significant nausea.  No vomiting.  No dizziness.  She is able to ambulate without difficulty.  No numbness or weakness to her extremities.  No change in her speech or vision.       Prior to Admission medications   Medication Sig Start Date End Date Taking? Authorizing Provider  alprazolam  (XANAX ) 2 MG tablet Take 1 tablet (2 mg total) by mouth at bedtime as needed for sleep. Patient not taking: Reported on 03/10/2024 02/22/24   Ines Onetha NOVAK, MD  aspirin  EC 81 MG tablet Take 81 mg by mouth at bedtime. Swallow whole.    [provider]  buPROPion  (WELLBUTRIN  XL) 300 MG 24 hr tablet Take 1 tablet (300 mg total) by mouth daily. Patient taking differently: Take 300 mg by mouth in the morning. 02/10/23   Whitmire, Dawn, FNP  Calcium  Carbonate-Vit D-Min (CALCIUM  1200 PO) Take 1 capsule by mouth at bedtime.    [provider]  capsaicin  topical system (QUTENZA , 4 PATCH,) 8 % FOR OFFICE ADMINISTRATION BY YOUR PHYSICIAN Patient taking differently: Apply 1 patch topically as needed. 07/26/23   Ines Onetha NOVAK, MD  carvedilol  (COREG ) 12.5 MG tablet Take 12.5 mg by mouth 2  (two) times daily with a meal. 05/11/23   [provider]  Cholecalciferol  (VITAMIN D3) 125 MCG (5000 UT) CAPS Take 1 capsule by mouth at bedtime.    [provider]  escitalopram  (LEXAPRO ) 5 MG tablet Take 1 tablet (5 mg total) by mouth daily. Patient taking differently: Take 5 mg by mouth at bedtime. 05/25/23   Whitmire, Dawn, FNP  gabapentin  (NEURONTIN ) 300 MG capsule TAKE 2 CAPSULES 3 TIMES A  DAY Patient taking differently: Take 600 mg by mouth 3 (three) times daily. 05/26/23   Ines Onetha NOVAK, MD  HYDROcodone -acetaminophen  (NORCO/VICODIN) 5-325 MG tablet Take 1 tablet by mouth every 6 (six) hours as needed for moderate pain (pain score 4-6). 03/11/24   Bergman, Meghan D, NP  lamoTRIgine  (LAMICTAL  XR) 50 MG 24 hour tablet Please increase your Lamictal  dose from 100mg  to 150mg  a day by adding a 50mg  pill to your current 100mg  pill. In 2 weeks, if no side effects, may increase to 200mg  daily by adding two 50mg  pills to your current 100mg  pill. Stop for any side effects especially rash. Patient taking differently: Take 150 mg by mouth at bedtime. 03/01/24   Ines Onetha NOVAK, MD  lidocaine -prilocaine  (EMLA ) cream APPLY TOPICALLY 1 APPLICATION AS NEEDED Patient taking differently: Apply 1 Application topically as needed (foot pains). 04/21/23   Ines Onetha NOVAK, MD  losartan -hydrochlorothiazide  (HYZAAR) 100-25 MG tablet Take  1 tablet by mouth daily. Patient taking differently: Take 1 tablet by mouth in the morning. 03/18/22   Whitmire, Dawn, FNP  Magnesium  Hydroxide (MILK OF MAGNESIA PO) Take 1 Dose by mouth as needed.    [provider]  magnesium  oxide (MAG-OX) 400 MG tablet Take 400 mg by mouth in the morning.    [provider]  MAGNESIUM  PO Take 1 capsule by mouth at bedtime. Magtein - Magnesium  L'threonate    [provider]  metoprolol  succinate (TOPROL -XL) 25 MG 24 hr tablet Take 25 mg by mouth daily. Patient not taking: Reported on 03/10/2024    [provider]  Multiple Vitamins-Minerals (OCUVITE-LUTEIN PO) Take 1 tablet by mouth in the morning.    [provider]  NONFORMULARY OR COMPOUNDED ITEM Compound Cream 7A (allow alternate if needed for insurance): Amantadine 8%, Baclofen 2%, Gabapentin  6%, Amitriptyline  4%, Bupivacaine  2%, Clonidine 0.2%, Nifedipine 2%.   Instructions: Apply 1-2 grams to the affected area 3-4 times daily. Patient taking differently: Apply 1 Application topically as needed. Compound Cream 7A (allow alternate if needed for insurance): Amantadine 8%, Baclofen 2%, Gabapentin  6%, Amitriptyline  4%, Bupivacaine  2%, Clonidine 0.2%, Nifedipine 2%.   Instructions: Apply 1-2 grams to the affected area 3-4 times daily. 08/16/23   Ines Onetha NOVAK, MD  rosuvastatin  (CRESTOR ) 20 MG tablet Take 1 tablet (20 mg total) by mouth at bedtime. 04/13/23   Whitmire, Dawn, FNP  SUPER B COMPLEX/C PO Take 1 capsule by mouth in the morning.    [provider]  tirzepatide  (MOUNJARO ) 15 MG/0.5ML Pen Inject 15 mg into the skin once a week. Patient taking differently: Inject 15 mg into the skin once a week. On Friday 05/25/23   Whitmire, Dawn, FNP  traMADol  (ULTRAM ) 50 MG tablet Take 1-2 tablets (50-100 mg total) by mouth every 6 (six) hours as needed. 400mg  max daily. 02/22/24   Ines Onetha NOVAK, MD  urea  (CARMOL) 10 % cream APPLY TO AFFECTED AREA EVERY DAY AS NEEDED Patient taking differently: Apply 1 Application topically as needed. 10/04/23   Standiford, Marsa FALCON, DPM    Allergies: Ambien [zolpidem], Atorvastatin , Codeine, Penicillins, Simvastatin, Meclizine , Meclizine  hcl, Other, and Byetta 10 mcg pen [exenatide]    Review of Systems  Constitutional:  Negative for chills, diaphoresis, fatigue and fever.  HENT:  Negative for congestion, rhinorrhea and sneezing.   Eyes: Negative.   Respiratory:  Negative for cough, chest tightness and shortness of breath.   Cardiovascular:  Negative for chest pain and leg swelling.   Gastrointestinal:  Positive for nausea. Negative for abdominal pain, blood in stool, diarrhea and vomiting.  Genitourinary:  Negative for difficulty urinating, flank pain, frequency and hematuria.  Musculoskeletal:  Negative for arthralgias and back pain.  Skin:  Negative for rash.  Neurological:  Positive for headaches. Negative for dizziness, speech difficulty, weakness and numbness.    Updated Vital Signs BP (!) 178/94 (BP Location: Right Arm)   Pulse 81   Temp 98.4 F (36.9 C) (Oral)   Resp 20   Ht 5' 3 (1.6 m)   Wt 80.3 kg   SpO2 100%   BMI 31.35 kg/m   Physical Exam Constitutional:      Appearance: She is well-developed.  HENT:     Head: Normocephalic and atraumatic.   Eyes:     Pupils: Pupils are equal, round, and reactive to light.    Cardiovascular:     Rate and Rhythm: Normal rate and regular rhythm.  Heart sounds: Murmur heard.  Pulmonary:     Effort: Pulmonary effort is normal. No respiratory distress.     Breath sounds: Normal breath sounds. No wheezing or rales.  Chest:     Chest wall: No tenderness.  Abdominal:     General: Bowel sounds are normal.     Palpations: Abdomen is soft.     Tenderness: There is no abdominal tenderness. There is no guarding or rebound.   Musculoskeletal:        General: Normal range of motion.     Cervical back: Normal range of motion and neck supple.  Lymphadenopathy:     Cervical: No cervical adenopathy.   Skin:    General: Skin is warm and dry.     Findings: No rash.   Neurological:     Mental Status: She is alert and oriented to person, place, and time.     Comments: Motor 5/5 all extremities Sensation grossly intact to LT all extremities Finger to Nose intact, no pronator drift CN II-XII grossly intact      (all labs ordered are listed, but only abnormal results are displayed) Labs Reviewed  BASIC METABOLIC PANEL WITH GFR - Abnormal; Notable for the following components:      Result Value   Sodium  124 (*)    Chloride 87 (*)    Glucose, Bld 103 (*)    All other components within normal limits  CBC WITH DIFFERENTIAL/PLATELET - Abnormal; Notable for the following components:   WBC 10.8 (*)    Neutro Abs 9.0 (*)    All other components within normal limits  URINALYSIS, W/ REFLEX TO CULTURE (INFECTION SUSPECTED)    EKG: None  Radiology: CT Head Wo Contrast Result Date: 03/13/2024 CLINICAL DATA:  Head trauma, moderate-severe recent, ICH, worsening headache EXAM: CT HEAD WITHOUT CONTRAST TECHNIQUE: Contiguous axial images were obtained from the base of the skull through the vertex without intravenous contrast. RADIATION DOSE REDUCTION: This exam was performed according to the departmental dose-optimization program which includes automated exposure control, adjustment of the mA and/or kV according to patient size and/or use of iterative reconstruction technique. COMPARISON:  CTHead March 11, 2024. FINDINGS: Brain: Unchanged multifocal hemorrhagic intraparenchymal contusions, worst in the right frontal lobe. Similar small volume subarachnoid hemorrhage along the right frontal convexity and sylvian fissure. Similar small volume of subdural hemorrhage along the right tentorial leaflet. No midline shift. No hydrocephalus. Similar patchy white matter hypodensities, compatible with chronic microvascular ischemic disease. No evidence of acute large vascular territory infarct. Vascular: No hyperdense vessel. Skull: No acute fracture. Sinuses/Orbits: Left sphenoid sinus mucosal thickening remaining sinuses are clear. No acute orbital findings. Other: No mastoid effusions. IMPRESSION: 1. Unchanged multifocal traumatic intraparenchymal and extra-axial hemorrhage. No midline shift or progressive mass effect. 2. No hydrocephalus. Electronically Signed   By: Gilmore GORMAN Molt M.D.   On: 03/13/2024 10:41     Procedures   Medications Ordered in the ED  sodium chloride  0.9 % bolus 500 mL (500 mLs Intravenous New  Bag/Given 03/13/24 0952)  ondansetron  (ZOFRAN ) injection 4 mg (4 mg Intravenous Given 03/13/24 9047)                                    Medical Decision Making Amount and/or Complexity of Data Reviewed Labs: ordered. Radiology: ordered.  Risk Prescription drug management. Decision regarding hospitalization.   This patient presents to the ED for concern of headache,  nausea, this involves an extensive number of treatment options, and is a complaint that carries with it a high risk of complications and morbidity.  I considered the following differential and admission for this acute, potentially life threatening condition.  The differential diagnosis includes worsening intracranial hemorrhage, stroke, electrolyte abnormality, infection  MDM:    Patient is a 73 year old who presents with ongoing headache and nausea since she was recently admitted for intracranial hemorrhage related to a fall.  She does not have any focal neurologic deficits.  Her friend did come to the room and provide a little more history.  She said she has had a little bit of confusion and that sometimes she will start a sentence and then cannot remember what she was saying.  She also has been squinting her right eye.  She said she has had a little bit of blurriness in that eye.  She does not have any visual field deficits.  No other neurologic deficits that would be more concerning for stroke.  Head CT does not show any change in the prior hemorrhage.  No worsening.  However her labs do show a marked hyponatremia.  It was a little low earlier this month but is slightly lower today.  She said she has not been taking her medications including her hydrochlorothiazide  since the fall.  She was given a small amount of IV fluids.  Discussed with the hospitalist to admit the patient for further treatment, Dr. Raenelle.  Discussed getting an MRI but Dr. Raenelle will assess first and then order as needed.  CRITICAL CARE Performed by:  Andrea Ness Total critical care time: 60 minutes Critical care time was exclusive of separately billable procedures and treating other patients. Critical care was necessary to treat or prevent imminent or life-threatening deterioration. Critical care was time spent personally by me on the following activities: development of treatment plan with patient and/or surrogate as well as nursing, discussions with consultants, evaluation of patient's response to treatment, examination of patient, obtaining history from patient or surrogate, ordering and performing treatments and interventions, ordering and review of laboratory studies, ordering and review of radiographic studies, pulse oximetry and re-evaluation of patient's condition.   (Labs, imaging, consults)  Labs: I Ordered, and personally interpreted labs.  The pertinent results include: Hyponatremia  Imaging Studies ordered: I ordered imaging studies including head CT I independently visualized and interpreted imaging. I agree with the radiologist interpretation  Additional history obtained from chart.  External records from outside source obtained and reviewed including prior notes  Cardiac Monitoring: The patient was maintained on a cardiac monitor.  If on the cardiac monitor, I personally viewed and interpreted the cardiac monitored which showed an underlying rhythm of: Sinus rhythm  Reevaluation: After the interventions noted above, I reevaluated the patient and found that they have :improved  Social Determinants of Health:    Disposition: Admit  Co morbidities that complicate the patient evaluation  Past Medical History:  Diagnosis Date   Anemia    Arthritis    knees, hips, possibly back (04/08/2016)   Atrial septal aneurysm    B12 deficiency    a. following gastric bypass.   Back pain    Cancer of kidney (HCC)    Cataract    Chronic lower back pain    spondylosis   Cold feet    Daily headache    short ones for  the last month or 2 (04/08/2016)   Depression    Diabetes (HCC)    Dry  mouth    DVT (deep venous thrombosis) (HCC)    Easy bruising    Floaters in visual field    GERD (gastroesophageal reflux disease)    takes Protonix  daily just to protect my stomach; not for reflux (04/08/2016)   Hay fever    Heart murmur    dx'd by 1 anesthesiologist years and years ago   History of loop recorder 03/2016   Hyperlipidemia    Hypertension    Joint pain    Leg cramp    Macular degeneration    just watching   Neuropathy    Nosebleed    Palpitations    Peripheral edema    Peripheral neuropathy    not on any meds   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   Pneumonia 12/2017   Sleep apnea    no CPAP since weight loss after bariatric surgery '12 (04/08/2016)   Spondylosis    Stroke (HCC) 04/07/2016   a. 01/2016 and 03/2016. during admission had + LE DVT.   Swelling of extremity    TIA (transient ischemic attack)    Trouble in sleeping    Urinary incontinence    Weakness      Medicines Meds ordered this encounter  Medications   sodium chloride  0.9 % bolus 500 mL   ondansetron  (ZOFRAN ) injection 4 mg    I have reviewed the patients home medicines and have made adjustments as needed  Problem List / ED Course: Problem List Items Addressed This Visit   None Visit Diagnoses       Intracranial hemorrhage (HCC)    -  Primary     Hyponatremia               ``````````````````````````````````````````````````````````````````````````````````````     Final diagnoses:  Intracranial hemorrhage (HCC)  Hyponatremia    ED Discharge Orders     None          Lenor Hollering, MD 03/13/24 1122

## 2024-03-13 NOTE — ED Notes (Signed)
 CCMD called and notified

## 2024-03-13 NOTE — ED Triage Notes (Signed)
 Pt BIB EMS from home, recently seen on 6/20 for fall and SAH. Complains of headache and nausea since discharge. No thinners. A&Ox4

## 2024-03-13 NOTE — ED Notes (Addendum)
Bed dirty

## 2024-03-13 NOTE — Evaluation (Signed)
 Occupational Therapy Evaluation Patient Details Name: [name] [name] MRN: [id number] DOB: 1950/12/10 Today's Date: 03/13/2024   History of Present Illness   73 yo F adm 03/13/24 with headache and nausea. SABRAPMH includes: HTN, DM, THA, CVA.  Recent hospitalization 6/20 after she had a mechanical fall and suffered multifocal contusions resulting in [facility] and SDH.  CT Scan 6/23:Unchanged multifocal traumatic intraparenchymal and extra-axial  hemorrhage. No midline shift or progressive mass effect.     Clinical Impressions Patient admitted for the diagnosis above.  PTA she lives alone and remains independent with all ADL, iADL, and continues to drive.  Friends will be staying with her for a few days to assist if needed.  Currently she presents a little unsteady, patient's first time up and moving, needing generalized supervision for in hall mobility to the ED bathroom.  OT can continue efforts in the acute setting to address deficits, but no post acute OT is anticipated.       If plan is discharge home, recommend the following:   Assist for transportation;Assistance with cooking/housework     Functional Status Assessment   Patient has had a recent decline in their functional status and demonstrates the ability to make significant improvements in function in a reasonable and predictable amount of time.     Equipment Recommendations   None recommended by OT     Recommendations for Other Services         Precautions/Restrictions   Precautions Precautions: Fall Recall of Precautions/Restrictions: Intact Restrictions Weight Bearing Restrictions Per Provider Order: No     Mobility Bed Mobility Overal bed mobility: Modified Independent                  Transfers Overall transfer level: Needs assistance   Transfers: Sit to/from Stand Sit to Stand: Modified independent (Device/Increase time)                  Balance Overall balance assessment: Mild deficits  observed, not formally tested                                         ADL either performed or assessed with clinical judgement   ADL       Grooming: Supervision/safety;Standing               Lower Body Dressing: Supervision/safety;Sit to/from stand   Toilet Transfer: Retail banker;Ambulation             General ADL Comments: minor LOB noted.     Vision Baseline Vision/History: 1 Wears glasses Patient Visual Report: Blurring of vision Additional Comments: blurring of vision with scanning between objects.     Perception Perception: Within Functional Limits       Praxis Praxis: WFL       Pertinent Vitals/Pain Pain Assessment Pain Assessment: No/denies pain     Extremity/Trunk Assessment Upper Extremity Assessment Upper Extremity Assessment: Overall WFL for tasks assessed   Lower Extremity Assessment Lower Extremity Assessment: Defer to PT evaluation   Cervical / Trunk Assessment Cervical / Trunk Assessment: Normal   Communication Communication Communication: No apparent difficulties   Cognition Arousal: Alert Behavior During Therapy: WFL for tasks assessed/performed Cognition: No apparent impairments                               Following commands: Intact  Cueing  General Comments   Cueing Techniques: Verbal cues  see PT eval   Exercises     Shoulder Instructions      Home Living Family/patient expects to be discharged to:: Private residence Living Arrangements: Alone Available Help at Discharge: Friend(s);Available PRN/intermittently Type of Home: House Home Access: Level entry     Home Layout: One level     Bathroom Shower/Tub: Producer, television/film/video: Handicapped height Bathroom Accessibility: Yes How Accessible: Accessible via walker Home Equipment: Rolling Walker (2 wheels);Shower seat;Hand held shower head;Grab bars - tub/shower   Additional Comments: RW  is being used by her mother.      Prior Functioning/Environment Prior Level of Function : Independent/Modified Independent;Driving                    OT Problem List: Impaired balance (sitting and/or standing)   OT Treatment/Interventions: Self-care/ADL training;Therapeutic activities;Patient/family education;Balance training      OT Goals(Current goals can be found in the care plan section)   Acute Rehab OT Goals Patient Stated Goal: Return home OT Goal Formulation: With patient Time For Goal Achievement: 03/27/24 Potential to Achieve Goals: Good ADL Goals Pt Will Perform Grooming: Independently;standing;sitting Pt Will Perform Lower Body Dressing: Independently;sit to/from stand Pt Will Transfer to Toilet: Independently;ambulating;regular height toilet   OT Frequency:  Min 2X/week    Co-evaluation              AM-PAC OT 6 Clicks Daily Activity     Outcome Measure Help from another person eating meals?: None Help from another person taking care of personal grooming?: None Help from another person toileting, which includes using toliet, bedpan, or urinal?: A Little Help from another person bathing (including washing, rinsing, drying)?: A Little Help from another person to put on and taking off regular upper body clothing?: None Help from another person to put on and taking off regular lower body clothing?: A Little 6 Click Score: 21   End of Session Nurse Communication: Mobility status  Activity Tolerance: Patient tolerated treatment well Patient left: Other (comment) (Handoff to PT)  OT Visit Diagnosis: Unsteadiness on feet (R26.81)                Time: 8554-8494 OT Time Calculation (min): 20 min Charges:  OT General Charges $OT Visit: 1 Visit OT Evaluation $OT Eval Moderate Complexity: 1 Mod  03/13/2024  RP, OTR/L  Acute Rehabilitation Services  Office:  [phone]   [name] [name] 03/13/2024, 3:20 PM

## 2024-03-13 NOTE — ED Notes (Signed)
 Per the Service Response Team, the tray should arrive by 2:30pm

## 2024-03-13 NOTE — Evaluation (Signed)
 Physical Therapy Evaluation Patient Details Name: [name] [name] MRN: [id number] DOB: [birth year] Today's Date: 03/13/2024  History of Present Illness  73 yo F admitted 03/13/24 with headache and nausea. PMH includes: HTN, DM, THA, CVA.  Recent hospitalization 6/20 after she had a mechanical fall and suffered multifocal contusions resulting in [facility] and SDH.  CT Scan 6/23:Unchanged multifocal traumatic intraparenchymal and extra-axial  hemorrhage. No midline shift or progressive mass effect.    Clinical Impression  Pt admitted with above diagnosis. Reports independent and active at baseline with some gait and balance deficits. Pt mod I with bed mobility and transfer. Supervision for safety without assistive device today, showing some mild instability, scissoring and reaching for rails in hallway (states this is baseline.) Does not have possession of her RW at home and would greatly benefit from its use (pt agrees.) DGI indicates increased fall risk without assistive device. Suggest HHPT follow-up until she is permitted to drive again, likely transition to outpatient PT for gait and balance.  Pt currently with functional limitations due to the deficits listed below (see PT Problem List). Pt will benefit from acute skilled PT to increase their independence and safety with mobility to allow discharge.           If plan is discharge home, recommend the following: A little help with walking and/or transfers;A little help with bathing/dressing/bathroom;Assistance with cooking/housework;Assist for transportation     Equipment Recommendations Rolling walker (2 wheels)     Functional Status Assessment Patient has had a recent decline in their functional status and demonstrates the ability to make significant improvements in function in a reasonable and predictable amount of time.     Precautions / Restrictions Precautions Precautions: Fall Recall of Precautions/Restrictions: Intact Restrictions Weight  Bearing Restrictions Per Provider Order: No      Mobility  Bed Mobility Overal bed mobility: Modified Independent                  Transfers Overall transfer level: Modified independent Equipment used: None Transfers: Sit to/from Stand Sit to Stand: Modified independent (Device/Increase time)                Ambulation/Gait Ambulation/Gait assistance: Supervision Gait Distance (Feet): 115 Feet Assistive device: None Gait Pattern/deviations: Step-through pattern, Decreased stride length, Antalgic, Wide base of support, Drifts right/left Gait velocity: dec Gait velocity interpretation: <1.8 ft/sec, indicate of risk for recurrent falls   General Gait Details: Minor instability with gait, pt reports baseline and typically furniture surfs. Wide BOS, antalgic. No overt buckling or physical intervention required to correct imbalance. Educated on safety, awareness, and recommended RW. Pt agreeable.  Stairs            Wheelchair Mobility     Tilt Bed    Modified Rankin (Stroke Patients Only)       Balance Overall balance assessment: Needs assistance Sitting-balance support: No upper extremity supported, Feet supported Sitting balance-Leahy Scale: Normal     Standing balance support: No upper extremity supported, During functional activity Standing balance-Leahy Scale: Fair                   Standardized Balance Assessment Standardized Balance Assessment : Dynamic Gait Index   Dynamic Gait Index Level Surface: Mild Impairment Change in Gait Speed: Mild Impairment Gait with Horizontal Head Turns: Mild Impairment Gait with Vertical Head Turns: Mild Impairment Gait and Pivot Turn: Mild Impairment Step Over Obstacle: Moderate Impairment Step Around Obstacles: Mild Impairment Steps: Moderate Impairment Total Score:  14       Pertinent Vitals/Pain Pain Assessment Pain Assessment: No/denies pain    Home Living Family/patient expects to be  discharged to:: Private residence Living Arrangements: Alone Available Help at Discharge: Friend(s);Available PRN/intermittently Type of Home: House Home Access: Level entry       Home Layout: One level Home Equipment: Shower seat;Hand held shower head;Grab bars - tub/shower Additional Comments: RW is being used by her mother.    Prior Function Prior Level of Function : Independent/Modified Independent;Driving;History of Falls (last six months)             Mobility Comments: active, working with a Systems analyst prior to initial injury.       Extremity/Trunk Assessment   Upper Extremity Assessment Upper Extremity Assessment: Defer to OT evaluation    Lower Extremity Assessment Lower Extremity Assessment: Generalized weakness (Hx of bil peripheral neuropathy.)    Cervical / Trunk Assessment Cervical / Trunk Assessment: Normal  Communication   Communication Communication: No apparent difficulties    Cognition Arousal: Alert Behavior During Therapy: WFL for tasks assessed/performed   PT - Cognitive impairments: No apparent impairments                         Following commands: Intact       Cueing Cueing Techniques: Verbal cues     General Comments General comments (skin integrity, edema, etc.): Reports she feels back to baseline, no nausea or headache. Able to eat today for the first time in 4 days. Friend present during evaluation, very supportive.    Exercises General Exercises - Lower Extremity Ankle Circles/Pumps: AROM, Both, 10 reps, Supine Quad Sets: Strengthening, Both, 10 reps, Supine Gluteal Sets: Strengthening, Both, 10 reps, Supine Other Exercises Other Exercises: Access Code: [email or web address]  URL: [email or web address]  Date: 03/13/2024  Prepared by: Leontine Roads    Exercises  - Supine Ankle Pumps  - 3 x daily - 7 x weekly - 3 sets - 10 reps  - Gluteal Sets  - 3 x daily - 7 x weekly - 3 sets - 10 reps  - Supine Quadricep Sets   - 3 x daily - 7 x weekly - 3 sets - 10 reps  - Side Stepping with Counter Support  - 1 x daily - 7 x weekly - 3 sets - 10 reps  - Wide Stance with Head Nods and Counter Support  - 1 x daily - 7 x weekly - 3 sets - 30 sec time  - Wide Stance with Head Rotations and Unilateral Counter Support  - 1 x daily - 7 x weekly - 3 sets - 30 sec tim   Assessment/Plan    PT Assessment Patient needs continued PT services  PT Problem List Decreased strength;Decreased range of motion;Decreased activity tolerance;Decreased balance;Decreased mobility;Decreased coordination;Decreased knowledge of use of DME;Impaired sensation       PT Treatment Interventions DME instruction;Gait training;Functional mobility training;Therapeutic activities;Therapeutic exercise;Balance training;Neuromuscular re-education;Patient/family education    PT Goals (Current goals can be found in the Care Plan section)  Acute Rehab PT Goals Patient Stated Goal: get well, go home PT Goal Formulation: With patient Time For Goal Achievement: 03/27/24 Potential to Achieve Goals: Good    Frequency Min 2X/week     Co-evaluation               AM-PAC PT 6 Clicks Mobility  Outcome Measure Help needed turning from your back to your side while in a flat bed  without using bedrails?: None Help needed moving from lying on your back to sitting on the side of a flat bed without using bedrails?: None Help needed moving to and from a bed to a chair (including a wheelchair)?: None Help needed standing up from a chair using your arms (e.g., wheelchair or bedside chair)?: A Little Help needed to walk in hospital room?: A Little Help needed climbing 3-5 steps with a railing? : A Little 6 Click Score: 21    End of Session Equipment Utilized During Treatment: Gait belt Activity Tolerance: Patient tolerated treatment well Patient left: in bed;with call bell/phone within reach;with family/visitor present   PT Visit Diagnosis: Unsteadiness on  feet (R26.81);Other abnormalities of gait and mobility (R26.89);History of falling (Z91.81);Muscle weakness (generalized) (M62.81);Other symptoms and signs involving the nervous system (R29.898)    Time: 8493-8477 PT Time Calculation (min) (ACUTE ONLY): 16 min   Charges:   PT Evaluation $PT Eval Low Complexity: 1 Low   PT General Charges $$ ACUTE PT VISIT: 1 Visit         Leontine Roads, PT, DPT [facility]  Rehabilitation Services Physical Therapist Office: [phone] Website: [email or web address]   Leontine GORMAN Roads 03/13/2024, 4:12 PM

## 2024-03-13 NOTE — ED Notes (Signed)
 A Heart diet meal tray has been ordered for the patient.

## 2024-03-13 NOTE — Assessment & Plan Note (Signed)
 Likely due to post concussive syndrome Supportive managment

## 2024-03-13 NOTE — Assessment & Plan Note (Signed)
 -   order urine electrolytes,  -Frequent labs

## 2024-03-13 NOTE — Assessment & Plan Note (Signed)
-  chronic avoid nephrotoxic medications such as NSAIDs, Vanco Zosyn combo,  avoid hypotension, continue to follow renal function

## 2024-03-13 NOTE — Assessment & Plan Note (Signed)
 Small repeat CT no change Stable No aspirin  or anticoagulation

## 2024-03-14 ENCOUNTER — Encounter (HOSPITAL_COMMUNITY): Payer: Self-pay | Admitting: Internal Medicine

## 2024-03-14 DIAGNOSIS — I10 Essential (primary) hypertension: Secondary | ICD-10-CM

## 2024-03-14 DIAGNOSIS — E1169 Type 2 diabetes mellitus with other specified complication: Secondary | ICD-10-CM

## 2024-03-14 LAB — SODIUM
Sodium: 127 mmol/L — ABNORMAL LOW (ref 135–145)
Sodium: 125 mmol/L — ABNORMAL LOW (ref 135–145)

## 2024-03-14 LAB — BASIC METABOLIC PANEL WITH GFR
Anion gap: 9 (ref 5–15)
BUN: 24 mg/dL — ABNORMAL HIGH (ref 8–23)
CO2: 24 mmol/L (ref 22–32)
Calcium: 8.4 mg/dL — ABNORMAL LOW (ref 8.9–10.3)
Chloride: 91 mmol/L — ABNORMAL LOW (ref 98–111)
Creatinine, Ser: 1.16 mg/dL — ABNORMAL HIGH (ref 0.44–1.00)
GFR, Estimated: 50 mL/min — ABNORMAL LOW (ref >60)
Glucose, Bld: 87 mg/dL (ref 70–99)
Potassium: 3.6 mmol/L (ref 3.5–5.1)
Sodium: 124 mmol/L — ABNORMAL LOW (ref 135–145)

## 2024-03-14 LAB — CBC
HCT: 37.2 % (ref 36.0–46.0)
Hemoglobin: 13 g/dL (ref 12.0–15.0)
MCH: 31 pg (ref 26.0–34.0)
MCHC: 34.9 g/dL (ref 30.0–36.0)
MCV: 88.6 fL (ref 80.0–100.0)
Platelets: 165 10*3/uL (ref 150–400)
RBC: 4.2 MIL/uL (ref 3.87–5.11)
RDW: 13.2 % (ref 11.5–15.5)
WBC: 8.2 10*3/uL (ref 4.0–10.5)
nRBC: 0 % (ref 0.0–0.2)

## 2024-03-14 LAB — GLUCOSE, CAPILLARY
Glucose-Capillary: 91 mg/dL (ref 70–99)
Glucose-Capillary: 76 mg/dL (ref 70–99)

## 2024-03-14 MED ORDER — HYDROCODONE-ACETAMINOPHEN 5-325 MG PO TABS: 1.0000 | ORAL_TABLET | Freq: Two times a day (BID) | ORAL | 0 refills | Status: AC | PRN

## 2024-03-14 MED ORDER — ACETAMINOPHEN 325 MG PO TABS
650.0000 mg | ORAL_TABLET | Freq: Four times a day (QID) | ORAL | Status: DC | PRN
Start: 2024-03-14 — End: 2024-03-17
  Administered 2024-03-15 (×2): 975 mg via ORAL
  Administered 2024-03-16: 650 mg via ORAL
  Filled 2024-03-14 (×8): qty 2

## 2024-03-14 MED ORDER — ALPRAZOLAM 0.5 MG PO TABS: 0.5000 mg | ORAL_TABLET | Freq: Every evening | ORAL | 0 refills | Status: AC | PRN

## 2024-03-14 MED ORDER — ALBUTEROL SULFATE (2.5 MG/3ML) 0.083% IN NEBU
2.5000 mg | INHALATION_SOLUTION | RESPIRATORY_TRACT | Status: DC | PRN
Start: 2024-03-14 — End: 2024-03-17
  Filled 2024-03-14 (×6): qty 3

## 2024-03-14 MED ORDER — PANTOPRAZOLE SODIUM 40 MG PO TBEC
40.0000 mg | DELAYED_RELEASE_TABLET | Freq: Every day | ORAL | Status: DC
  Administered 2024-03-14 – 2024-03-16 (×3): 40 mg via ORAL
  Filled 2024-03-14 (×3): qty 1

## 2024-03-14 MED ORDER — SODIUM CHLORIDE 0.9 % IV SOLN: INTRAVENOUS | Status: AC

## 2024-03-14 NOTE — TOC CM/SW Note (Signed)
 Transition of Care [facility]) - Inpatient Brief Assessment   Patient Details  Name: ANANDA CAYA MRN: [id number] Date of Birth: November 01, 1950  [facility] CM/SW Contact:    Tom-Johnson, Harvest Muskrat, RN Phone Number: 03/14/2024, 12:45 PM   Clinical Narrative:  Patient presented to the ED with worsening Headache and Nausea.  Patient was recently admitted from 03/10/24-03/11/24 after a Mechanical Fall at home and sustained Multifocal Contusions resulting in [facility] and SDH.  CT Head showed unchanged Multifocal Traumatic Intraparenchymal and Extra-Axial Hemorrhage. No Midline Shift or progressive Mass effect and no Hydrocephalus. Labs showed worsening Hyponatremia.  Patient has hx of DM, HTN, CVA and Diabetic Neuropathy.   CM spoke with patient at bedside about needs for post hospital  transition. From home alone, does not have children and no siblings. Patient states her mother lives next door and she is her mother's caregiver. Retired, independent with her care and drive self. Has a handicapped bathroom. RW ordered from [employer] and will be delivered to patient at bedside.  PCP is  Claudene Lacks, MD and uses [facility] on [address] [facility].  Home health recommended, patient has no preference. CM sent referral to [facility] noted acceptance, info on AVS.   Patient will be discharging home under the services of the [facility] program and they will arrange transportation.  CM will continue to follow as patient progresses towards discharge.         Transition of Care Asessment: Insurance and Status: Insurance coverage has been reviewed Patient has primary care physician: Yes Home environment has been reviewed: Yes Prior level of function:: Independent Prior/Current Home Services: No current home services Social Drivers of Health Review: SDOH reviewed no interventions necessary Readmission risk has been reviewed: Yes Transition of care needs: transition of  care needs identified, TOC will continue to follow

## 2024-03-14 NOTE — Progress Notes (Addendum)
 Hospital at Home Transfer Note   Patient: [name] [name] FMW:[id number] DOB: Dec 29, 1950 DOA: 03/13/2024     1 DOS: the patient was seen and examined on 03/14/2024   Brief hospital course: [name] [name] is a 73 y.o. female with medical history significant of HTN, DM, CVA, diabetic neuropathy-who was recently hospitalized from 6/20-6/21 under neurosurgical service-after she had a mechanical fall and suffered multifocal contusions resulting in [facility] and SDH.  She was observed overnight and subsequently discharged home.  She presented to the ED today with complaints of severe nausea-mostly retching but no active vomiting.  Per patient and friend at bedside-patient has not had any significant oral intake for the past 2-3 days-apparently the only thing she was able to drink was some Gatorade a few days back.  Her headaches have essentially been the same for the past several days-she also complains of some neck pain.  Along with the symptoms-she has developed right eye blurry vision for the past 2-3 days as well.  Noted to have been seen by nephrology for hyponatremia 11/2023 felt to be related to excessive free water  intake. Does report drinking 1-2 drinks of wine weekly. Denies regular ETOH intake on a regular basis.    While in the ED-she was found to have worsening hyponatremia-repeat CT head did not show any worsening of multifocal traumatic intraparenchymal and extra-axial hemorrhage. Overall plan of care discussed at length with the patient at the bedside. Pt is agreeable to transfer to hospital at home program. Consent obtained.   Assessment and Plan: Hyponatremia Given the fact that patient has had poor oral intake-has been on HCTZ-I suspect this is presumably from dehydration-however her volume status appears stable.  She is also on numerous medications including Wellbutrin /Lexapro  that could potentially cause hyponatremia as well. Patient has already received 500 cc of NS here in the ED-will repeat  labs in a few hours-but suspect if no improvement will require IVF hydration overnight.Will hold Lexapro /Wellbutrin /HCTZ-and follow sodium levels.    Urine and serum studies pending  Na level 124-->122-->124  Will continue w/ IVF for now  1.5L fluid restriction  Of note-patient does appear to have some amount of chronic mild hyponatremia at baseline-with sodium ranging from 128-134.   Goal Na 130-134 Per prior outpatient nephrology note-this was attributed to excessive free water  intake Monitor fluid intake at home    Right eye blurry vision Apparently started after recent fall and traumatic SAH/SDH MRI stable  Probably related to SAH/SDH Monitor    Recent mechanical fall with traumatic small volume SAH/SDH Repeat CT head shows unchanged changes Supportive care PT/OT eval   Nausea Unclear etiology-improving  Repeat CT head reassuring No abdominal pain-abdominal exam is benign Has tolerated breakfast and lunch with minimal nausea  ? Element of GERD  Start PPI  Monitor    HTN Holding HCTZ/losartan  for now Continue Coreg  and follow BP trend   DM-2 Blood sugar 90s  Monitor    HLD Statin   History of CVA Not on aspirin -presumably secondary to recent SAH/SDH Continue statin PT/OT eval.   Peripheral neuropathy Related to diabetes Continue Neurontin    History of solitary left kidney-s/p right radical nephrectomy July 2022 for renal cell cancer at [facility]       Subjective: Symptomatically improving. Still clinically dry.   Physical Exam: Vitals:   03/13/24 1836 03/13/24 2128 03/14/24 0413 03/14/24 0733  BP: 112/72 116/76 112/70 111/63  Pulse: 80 73 78 76  Resp: 16 16 18 18   Temp: 97.7 F (  36.5 C) 98.1 F (36.7 C) 97.8 F (36.6 C) 98.1 F (36.7 C)  TempSrc: Oral Oral Oral Oral  SpO2: 97% 98% 99% 100%  Weight:      Height:       Physical Exam Constitutional:      Appearance: She is normal weight.  HENT:     Head: Normocephalic.     Nose: Nose normal.      Mouth/Throat:     Mouth: Mucous membranes are dry.   Eyes:     Pupils: Pupils are equal, round, and reactive to light.    Cardiovascular:     Rate and Rhythm: Normal rate and regular rhythm.  Pulmonary:     Effort: Pulmonary effort is normal.   Musculoskeletal:        General: Normal range of motion.   Skin:    General: Skin is dry.   Neurological:     General: No focal deficit present.   Psychiatric:        Mood and Affect: Mood normal.     Data Reviewed:  There are no new results to review at this time.  MR BRAIN WO CONTRAST CLINICAL DATA:  Initial evaluation for acute headache.  EXAM: MRI HEAD WITHOUT CONTRAST  TECHNIQUE: Multiplanar, multiecho pulse sequences of the brain and surrounding structures were obtained without intravenous contrast.  COMPARISON:  Prior CT from earlier the same day as well as previous exams.  FINDINGS: Brain: Examination mildly limited due to susceptibility artifact emanating from dental hardware.  Generalized age-related cerebral atrophy. Extensive patchy and confluent T2/FLAIR hyperintensity involving the periventricular and deep white matter both cerebral hemispheres as well as the pons, most likely related to chronic microvascular ischemic disease, moderately advanced in nature. Few scatter remote lacunar infarcts present about the left frontal corona radiata and left thalamus.  Scattered evolving multifocal parenchymal contusions involving the right greater than left frontal lobes and right temporal lobe. Mild localized edema without significant regional mass effect. Small subdural hematoma overlying the left cerebral convexity measures up to 3 mm in maximal thickness without significant mass effect or midline shift. Overall, appearance of these changes is relatively similar to prior. No other acute intracranial hemorrhage. Focus of chronic hemosiderin staining involving the right occipital lobe noted, stable from  prior. Few additional scattered chronic micro hemorrhages noted involving the supratentorial cerebral white matter, likely hypertensive in nature.  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 No other mass lesion, mass effect, or midline shift. No hydrocephalus or extra-axial fluid collection.  Vascular: Major major intracranial vascular flow voids are maintained.  Skull and upper cervical spine: Craniocervical junction within normal limits. Bone marrow signal intensity normal. No scalp soft tissue abnormality.  Sinuses/Orbits: Prior bilateral ocular lens replacement. Paranasal sinuses are largely clear. No significant mastoid effusion.  Other: None.  IMPRESSION: 1. Evolving multifocal parenchymal contusions involving the right greater than left frontal lobes and right temporal lobe, relatively stable from prior. Mild localized edema without significant regional mass effect. 2. Small subdural hematoma overlying the left cerebral convexity measuring up to 3 mm in maximal thickness without significant mass effect or midline shift. 3. Underlying age-related cerebral atrophy with moderate chronic microvascular ischemic disease, with a few scattered remote lacunar infarcts involving the left frontal corona radiata and left thalamus. 4. Grossly stable appearance of patient's known pituitary adenoma extending into the sphenoid sinuses.  Electronically Signed   By: Morene Hoard M.D.   On: 03/13/2024 22:39  CT Head Wo Contrast CLINICAL DATA:  Head trauma, moderate-severe recent, ICH, worsening headache  EXAM: CT HEAD WITHOUT CONTRAST  TECHNIQUE: Contiguous axial images were obtained from the base of the skull through the vertex without intravenous contrast.  RADIATION DOSE REDUCTION: This exam was performed according to the departmental dose-optimization program which includes automated exposure control,  adjustment of the mA and/or kV according to patient size and/or use of iterative reconstruction technique.  COMPARISON:  CTHead March 11, 2024.  FINDINGS: Brain: Unchanged multifocal hemorrhagic intraparenchymal contusions, worst in the right frontal lobe. Similar small volume subarachnoid hemorrhage along the right frontal convexity and sylvian fissure. Similar small volume of subdural hemorrhage along the right tentorial leaflet. No midline shift. No hydrocephalus. Similar patchy white matter hypodensities, compatible with chronic microvascular ischemic disease. No evidence of acute large vascular territory infarct.  Vascular: No hyperdense vessel.  Skull: No acute fracture.  Sinuses/Orbits: Left sphenoid sinus mucosal thickening remaining sinuses are clear. No acute orbital findings.  Other: No mastoid effusions.  IMPRESSION: 1. Unchanged multifocal traumatic intraparenchymal and extra-axial hemorrhage. No midline shift or progressive mass effect. 2. No hydrocephalus.  Electronically Signed   By: Gilmore GORMAN Molt M.D.   On: 03/13/2024 10:41  Lab Results  Component Value Date   WBC 8.2 03/14/2024   HGB 13.0 03/14/2024   HCT 37.2 03/14/2024   MCV 88.6 03/14/2024   PLT 165 03/14/2024   Last metabolic panel Lab Results  Component Value Date   GLUCOSE 87 03/14/2024   NA 124 (L) 03/14/2024   K 3.6 03/14/2024   CL 91 (L) 03/14/2024   CO2 24 03/14/2024   BUN 24 (H) 03/14/2024   CREATININE 1.16 (H) 03/14/2024   GFRNONAA 50 (L) 03/14/2024   CALCIUM  8.4 (L) 03/14/2024   PROT 6.3 (L) 03/10/2024   ALBUMIN 3.6 03/10/2024   LABGLOB 2.2 02/23/2023   AGRATIO 2.1 02/23/2023   BILITOT 0.9 03/10/2024   ALKPHOS 68 03/10/2024   AST 23 03/10/2024   ALT 22 03/10/2024   ANIONGAP 9 03/14/2024    Family Communication: Plan of care discussed with patient and friend at the bedside   Disposition: Status is: Inpatient Remains inpatient appropriate because: Continued IVF for  correction of hyponatremia in setting of nausea- improving   Planned Discharge Destination: Home    Time spent: 60+ minutes  Author: Elspeth JINNY Masters, MD 03/14/2024 9:47 AM  For on call review [email or web address].

## 2024-03-15 LAB — COMPREHENSIVE METABOLIC PANEL WITH GFR
ALT: 13 U/L (ref 0–44)
AST: 17 U/L (ref 15–41)
Albumin: 3.4 g/dL — ABNORMAL LOW (ref 3.5–5.0)
Alkaline Phosphatase: 60 U/L (ref 38–126)
Anion gap: 9 (ref 5–15)
BUN: 20 mg/dL (ref 8–23)
CO2: 24 mmol/L (ref 22–32)
Calcium: 8.5 mg/dL — ABNORMAL LOW (ref 8.9–10.3)
Chloride: 94 mmol/L — ABNORMAL LOW (ref 98–111)
Creatinine, Ser: 1.05 mg/dL — ABNORMAL HIGH (ref 0.44–1.00)
GFR, Estimated: 56 mL/min — ABNORMAL LOW (ref >60)
Glucose, Bld: 97 mg/dL (ref 70–99)
Potassium: 3.6 mmol/L (ref 3.5–5.1)
Sodium: 127 mmol/L — ABNORMAL LOW (ref 135–145)
Total Bilirubin: 0.7 mg/dL (ref 0.0–1.2)
Total Protein: 5.9 g/dL — ABNORMAL LOW (ref 6.5–8.1)

## 2024-03-15 LAB — CBC
HCT: 36.3 % (ref 36.0–46.0)
Hemoglobin: 12.5 g/dL (ref 12.0–15.0)
MCH: 30.5 pg (ref 26.0–34.0)
MCHC: 34.4 g/dL (ref 30.0–36.0)
MCV: 88.5 fL (ref 80.0–100.0)
Platelets: 168 10*3/uL (ref 150–400)
RBC: 4.1 MIL/uL (ref 3.87–5.11)
RDW: 13.2 % (ref 11.5–15.5)
WBC: 6.6 10*3/uL (ref 4.0–10.5)
nRBC: 0 % (ref 0.0–0.2)

## 2024-03-15 LAB — OSMOLALITY, URINE: Osmolality, Ur: 626 mosm/kg (ref 300–900)

## 2024-03-15 LAB — SODIUM
Sodium: 126 mmol/L — ABNORMAL LOW (ref 135–145)
Sodium: 126 mmol/L — ABNORMAL LOW (ref 135–145)

## 2024-03-15 LAB — SODIUM, URINE, RANDOM: Sodium, Ur: <10 mmol/L

## 2024-03-15 MED ORDER — CLINDAMYCIN HCL 300 MG PO CAPS: 300.0000 mg | ORAL_CAPSULE | Freq: Four times a day (QID) | ORAL | Status: DC

## 2024-03-15 MED ORDER — SODIUM CHLORIDE 0.9 % IV SOLN
INTRAVENOUS | Status: AC
  Administered 2024-03-15: 2000 mL via INTRAVENOUS

## 2024-03-15 MED ORDER — LAMOTRIGINE ER 50 MG PO TB24: 150.0000 mg | ORAL_TABLET | Freq: Every day | ORAL | Status: DC

## 2024-03-15 NOTE — Progress Notes (Addendum)
 [facility] Transfer Note   Patient: [name] [name] FMW:[id number] DOB: 17-Feb-1951 DOA: 03/13/2024     2 DOS: the patient was seen and examined on 03/15/2024   Brief hospital course: [name] [name] is a 73 y.o. female with medical history significant of HTN, DM, CVA, diabetic neuropathy-who was recently hospitalized from 6/20-6/21 under neurosurgical service-after she had a mechanical fall and suffered multifocal contusions resulting in [facility] and SDH.  She was observed overnight and subsequently discharged home.  She presented to the ED today with complaints of severe nausea-mostly retching but no active vomiting.  Per patient and friend at bedside-patient has not had any significant oral intake for the past 2-3 days-apparently the only thing she was able to drink was some Gatorade a few days back.  Her headaches have essentially been the same for the past several days-she also complains of some neck pain.  Along with the symptoms-she has developed right eye blurry vision for the past 2-3 days as well.  Noted to have been seen by nephrology for hyponatremia 11/2023 felt to be related to excessive free water  intake. Does report drinking 1-2 drinks of wine weekly. Denies regular ETOH intake on a regular basis.    While in the ED-she was found to have worsening hyponatremia-repeat CT head did not show any worsening of multifocal traumatic intraparenchymal and extra-axial hemorrhage. Overall plan of care discussed at length with the patient at the bedside. Pt is agreeable to transfer to hospital at home program. Consent obtained.   Assessment and Plan: Hyponatremia Given the fact that patient has had poor oral intake-has been on HCTZ-I suspect this is presumably from dehydration-however her volume status appeared stable.  She is also on numerous medications including Wellbutrin /Lexapro  that could potentially cause hyponatremia as well. Patient received 500 cc of NS  in the ED and received NS 75 cchr  overnight.labs 6/25 na 127 Continue to hold Lexapro /Wellbutrin /HCTZ-and follow sodium levels.    Serum osm 263 low ?excess fluid intake vs SIADH (Tsh 1.218) Na level 124-->122-->124 >125>127 1.5L fluid restriction  Of note-patient does appear to have some amount of chronic mild hyponatremia at baseline-with sodium ranging from 128-134 since 2021  Goal Na 130-134 Per prior outpatient nephrology note-this was attributed to excessive free water  intake Monitor fluid intake at home  Continue NS  24 HRS RECHECK NA IN AM URINE OSM AND NA   Right eye blurry vision resolved  Apparently started after recent fall and traumatic SAH/SDH MRI stable  Probably related to SAH/SDH Monitor    Recent mechanical fall with traumatic small volume SAH/SDH Repeat CT head shows unchanged Supportive care  Headache due to recent SDH/SAH-Tylenol  1gm bid  Vicodin prn   Nausea resolved Repeat CT head reassuring No abdominal pain-abdominal exam is benign Started PPI    HTN Holding HCTZ/losartan  for now Continue Coreg  and follow BP trend 136/78   DM-2 Blood sugar 90s  On januvia  Her oral intake has finally improved   HLD Statin   History of CVA Not on aspirin - secondary to recent SAH/SDH  NS notes say to restart 7/4 Continue statin   Peripheral neuropathy Related to diabetes Continue Neurontin    History of solitary left kidney-s/p right radical nephrectomy July 2022 for renal cell cancer at [facility]       Subjective: feels better no nausea vision changes  Has some head ache which is better    Physical Exam: performed by victoria Huffman Vitals:   03/14/24 0413 03/14/24 9266 03/14/24  1751 03/15/24 0918  BP: 112/70 111/63 132/70 136/78  Pulse: 78 76 66 73  Resp: 18 18    Temp: 97.8 F (36.6 C) 98.1 F (36.7 C)    TempSrc: Oral Oral    SpO2: 99% 100%    Weight:      Height:        She is sitting up in chair in nad Chest cta Cvs reg Abd soft nt Ext no edema  Data  Reviewed:  There are no new results to review at this time.  MR BRAIN WO CONTRAST CLINICAL DATA:  Initial evaluation for acute headache.  EXAM: MRI HEAD WITHOUT CONTRAST  TECHNIQUE: Multiplanar, multiecho pulse sequences of the brain and surrounding structures were obtained without intravenous contrast.  COMPARISON:  Prior CT from earlier the same day as well as previous exams.  FINDINGS: Brain: Examination mildly limited due to susceptibility artifact emanating from dental hardware.  Generalized age-related cerebral atrophy. Extensive patchy and confluent T2/FLAIR hyperintensity involving the periventricular and deep white matter both cerebral hemispheres as well as the pons, most likely related to chronic microvascular ischemic disease, moderately advanced in nature. Few scatter remote lacunar infarcts present about the left frontal corona radiata and left thalamus.  Scattered evolving multifocal parenchymal contusions involving the right greater than left frontal lobes and right temporal lobe. Mild localized edema without significant regional mass effect. Small subdural hematoma overlying the left cerebral convexity measures up to 3 mm in maximal thickness without significant mass effect or midline shift. Overall, appearance of these changes is relatively similar to prior. No other acute intracranial hemorrhage. Focus of chronic hemosiderin staining involving the right occipital lobe noted, stable from prior. Few additional scattered chronic micro hemorrhages noted involving the supratentorial cerebral white matter, likely hypertensive in nature.  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 No other mass lesion, mass effect, or midline shift. No hydrocephalus or extra-axial fluid collection.  Vascular: Major major intracranial vascular flow voids are maintained.  Skull and upper cervical spine: Craniocervical  junction within normal limits. Bone marrow signal intensity normal. No scalp soft tissue abnormality.  Sinuses/Orbits: Prior bilateral ocular lens replacement. Paranasal sinuses are largely clear. No significant mastoid effusion.  Other: None.  IMPRESSION: 1. Evolving multifocal parenchymal contusions involving the right greater than left frontal lobes and right temporal lobe, relatively stable from prior. Mild localized edema without significant regional mass effect. 2. Small subdural hematoma overlying the left cerebral convexity measuring up to 3 mm in maximal thickness without significant mass effect or midline shift. 3. Underlying age-related cerebral atrophy with moderate chronic microvascular ischemic disease, with a few scattered remote lacunar infarcts involving the left frontal corona radiata and left thalamus. 4. Grossly stable appearance of patient's known pituitary adenoma extending into the sphenoid sinuses.  Electronically Signed   By: Morene Hoard M.D.   On: 03/13/2024 22:39 CT Head Wo Contrast CLINICAL DATA:  Head trauma, moderate-severe recent, ICH, worsening headache  EXAM: CT HEAD WITHOUT CONTRAST  TECHNIQUE: Contiguous axial images were obtained from the base of the skull through the vertex without intravenous contrast.  RADIATION DOSE REDUCTION: This exam was performed according to the departmental dose-optimization program which includes automated exposure control, adjustment of the mA and/or kV according to patient size and/or use of iterative reconstruction technique.  COMPARISON:  CTHead March 11, 2024.  FINDINGS: Brain: Unchanged multifocal hemorrhagic intraparenchymal contusions, worst in the right frontal lobe. Similar small volume subarachnoid hemorrhage along the  right frontal convexity and sylvian fissure. Similar small volume of subdural hemorrhage along the right tentorial leaflet. No midline shift. No hydrocephalus.  Similar patchy white matter hypodensities, compatible with chronic microvascular ischemic disease. No evidence of acute large vascular territory infarct.  Vascular: No hyperdense vessel.  Skull: No acute fracture.  Sinuses/Orbits: Left sphenoid sinus mucosal thickening remaining sinuses are clear. No acute orbital findings.  Other: No mastoid effusions.  IMPRESSION: 1. Unchanged multifocal traumatic intraparenchymal and extra-axial hemorrhage. No midline shift or progressive mass effect. 2. No hydrocephalus.  Electronically Signed   By: Gilmore GORMAN Molt M.D.   On: 03/13/2024 10:41  Lab Results  Component Value Date   WBC 8.2 03/14/2024   HGB 13.0 03/14/2024   HCT 37.2 03/14/2024   MCV 88.6 03/14/2024   PLT 165 03/14/2024   Last metabolic panel Lab Results  Component Value Date   GLUCOSE 87 03/14/2024   NA 125 (L) 03/14/2024   K 3.6 03/14/2024   CL 91 (L) 03/14/2024   CO2 24 03/14/2024   BUN 24 (H) 03/14/2024   CREATININE 1.16 (H) 03/14/2024   GFRNONAA 50 (L) 03/14/2024   CALCIUM  8.4 (L) 03/14/2024   PROT 6.3 (L) 03/10/2024   ALBUMIN 3.6 03/10/2024   LABGLOB 2.2 02/23/2023   AGRATIO 2.1 02/23/2023   BILITOT 0.9 03/10/2024   ALKPHOS 68 03/10/2024   AST 23 03/10/2024   ALT 22 03/10/2024   ANIONGAP 9 03/14/2024    Family Communication: Plan of care discussed with patient and friend at the bedside   Disposition: Status is: Inpatient Remains inpatient appropriate because: acute hyponatremia   Planned Discharge Destination: Home    Time spent: 60+ minutes  Author: Almarie KANDICE Hoots, MD 03/15/2024 9:22 AM  For on call review [email or web address].

## 2024-03-15 NOTE — Plan of Care (Signed)

## 2024-03-15 NOTE — Progress Notes (Signed)
 PT was sitting upright on her couch when paramedic arrived for morning visit. No obvious distress noted, PT is alert and oriented to her baseline. PT stated that she had a good night of sleep and denied any issues with IV during the night. lVF did not properly infuse last night due to equipment failure, she did receive 1L of IVF. IV was patent and dressing was changed due to blood on the inside of the dressing.   PT stated that she had a mild headache rating it a 3/10 on the pain scale. PT described it as a throb and has not changed since yesterday. She denied any visual changes. PT denied any other complaints.   Information from AM IDR was shared with the PT and she did not have any questions at the time for paramedic Bevin Mayall. Virtual visit with MD Alvia occurred and did not have any further questions. Blood draw was attempted 3 times by paramedic [facility] and was unsuccessful. PT was transported via [facility] vehicle to the outpatient laboratory for blood work and was transported back home without incident.   PT sat down on her couch without incident. She denied having any other questions for paramedic Persis Graffius. PT was fine with PM visit around 1700.

## 2024-03-15 NOTE — Progress Notes (Signed)
 Paramedic Hakeem Frazzini arrived to the PT's house and found the PT sitting on her couch without incident. PT stated that she was still having a headache that was increasing in pain, denied any visual changes, N/V. PT's vitals were obtained and documented accordingly.  IV blood draw and urine sample were obtained. IVF were initiated however unable to start due to equipment failure with IV pump. Paramedic [place] obtained a new IV pump and returned to the PT later in the evening.   IVF were administered w/o issues. PM medications were administered.   PT denied having any questions and was reminded to call the RN if she needed any help throughout the night.

## 2024-03-15 NOTE — Progress Notes (Signed)
 Patient has remained stable via current health overnight. No additional calls made to RN.

## 2024-03-15 NOTE — Plan of Care (Signed)

## 2024-03-16 LAB — SODIUM: Sodium: 130 mmol/L — ABNORMAL LOW (ref 135–145)

## 2024-03-16 MED ORDER — LOSARTAN POTASSIUM 25 MG PO TABS: 12.5000 mg | ORAL_TABLET | Freq: Every day | ORAL | 11 refills | Status: AC

## 2024-03-16 MED ORDER — ACETAMINOPHEN 325 MG PO TABS: 650.0000 mg | ORAL_TABLET | Freq: Four times a day (QID) | ORAL | Status: AC | PRN

## 2024-03-16 MED ORDER — ASPIRIN 81 MG PO TBEC
81.0000 mg | DELAYED_RELEASE_TABLET | Freq: Every day | ORAL | 12 refills | Status: AC
Start: 2024-03-24 — End: ?

## 2024-03-16 NOTE — Progress Notes (Signed)
 All Current health alerts for Tachypnea addressed, patient reported and witnessed via video and phone calls as stable. Patient had considerable movement and was adjusting wearable. Patient alerted again before bed and was called. Wearable removed and placed back on, patient confirmed her first adjustment did not have full contact with the skin. Patient encouraged  to readjust the device ensuring  the strap is comfortable and able to fit one finger under the strap. After adjustment current health data resumed capturing data within in normal parameters without alerts. Patient confirmed she was heading to bed for the night. Plan for emergent needs reviewed.

## 2024-03-16 NOTE — Discharge Summary (Signed)
 Physician Discharge Summary  [name] [name] FMW:[id number] DOB: 24-Oct-1950 DOA: 03/13/2024  PCP: Claudene Lacks, MD  Admit date: 03/13/2024 Discharge date: 03/16/2024  Admitted From: home Disposition:home Recommendations for Outpatient Follow-up:  Follow up with PCP  on 03/30/24 Please obtain BMP/CBC in one week  Home Health:yes Equipment/Devices:none Discharge Condition:stable CODE STATUS:full Diet recommendation: regular  Brief/Interim Summary:  73 y.o. female with medical history significant of HTN, DM, CVA, diabetic neuropathy-who was recently hospitalized from 6/20-6/21 under neurosurgical service-after she had a mechanical fall and suffered multifocal contusions resulting in [facility] and SDH.  She was observed overnight and subsequently discharged home.  She presented to the ED  with complaints of severe nausea-mostly retching but no active vomiting.  Per patient and friend at bedside-patient has not had any significant oral intake for the past 2-3 days-apparently the only thing she was able to drink was some Gatorade a few days back.  Her headaches have essentially been the same for the past several days-she also complains of some neck pain.  Along with the symptoms-she has developed right eye blurry vision for the past 2-3 days as well.  Noted to have been seen by nephrology for hyponatremia 11/2023 felt to be related to excessive free water  intake. Does report drinking 1-2 drinks of wine weekly. Denies regular ETOH intake on a regular basis.    While in the ED-she was found to have worsening hyponatremia-repeat CT head did not show any worsening of multifocal traumatic intraparenchymal and extra-axial hemorrhage. Overall plan of care discussed at length with the patient at the bedside. Pt was agreeable to transfer to hospital at home program. Consent obtained.    Discharge Diagnoses:  Principal Problem:   Hyponatremia Active Problems:   Disease related peripheral neuropathy   CKD stage  3a, GFR 45-59 ml/min (HCC)   TBI (traumatic brain injury) (HCC)   Dehydration   Nausea & vomiting   Subarachnoid hemorrhage (HCC)   Intracranial hemorrhage (HCC)  Hyponatremia- Given the fact that patient has had poor oral intake-has been on HCTZ-I suspect this was presumably from dehydration.she was treated with NS with improvement in na to 130 on the day of dc.  She is also on numerous medications including Wellbutrin /Lexapro  that could potentially cause hyponatremia as well. Of note-patient does appear to have some amount of chronic mild hyponatremia at baseline-with sodium ranging from 128-134 since 2021  Per prior outpatient nephrology note-this was attributed to excessive free water  intake Advised to liberalize salt in her diet.   Right eye blurry vision resolved  Apparently started after recent fall and traumatic SAH/SDH MRI stable  Probably related to SAH/SDH   Recent mechanical fall with traumatic small volume SAH/SDH- I am concerned she is on multiple meds that can cause drowsiness and fall (has neuropathy too) She is on ultram  wellbutrin  neurontin  lamictal  unisom I have STOPPED ULTRAM  ON DC. PCP please reevaluate her on follow up. Check bmp to follow up on hyponatremia. Repeat CT head shows unchanged   Headache due to recent SDH/SAH-Tylenol  prn   Nausea resolved Repeat CT head reassuring No abdominal pain-abdominal exam is benign    HTN Held  HCTZ/losartan  during hospital stay.stopped hydrochlorothiazide  on dc due to causing hyponatremia. Continue Coreg  and losartan  12.5 mg    DM-2 Blood sugar 90s  On januvia    HLD Statin   History of CVA Not on aspirin - secondary to recent SAH/SDH  NS notes say to restart 7/4 Continue statin   Peripheral neuropathy Related to diabetes Continue  Neurontin  Lamictal  started by neuro recently    History of solitary left kidney-s/p right radical nephrectomy July 2022 for renal cell cancer at [facility]  Estimated body mass  index is 31.35 kg/m as calculated from the following:   Height as of this encounter: 5' 3 (1.6 m).   Weight as of this encounter: 80.3 kg.  Discharge Instructions  Discharge Instructions     Diet - low sodium heart healthy   Complete by: As directed    Increase activity slowly   Complete by: As directed       Allergies as of 03/16/2024       Reactions   Ambien [zolpidem] Other (See Comments)   NIGHT TERRORS   Atorvastatin  Other (See Comments)   Muscle weakness Muscle Aches   Codeine Nausea And Vomiting   Penicillins Itching, Other (See Comments)   Made nose run uncontrollably Has patient had a PCN reaction causing immediate rash, facial/tongue/throat swelling, SOB or lightheadedness with hypotension: No Has patient had a PCN reaction causing severe rash involving mucus membranes or skin necrosis: No Has patient had a PCN reaction that required hospitalization No Has patient had a PCN reaction occurring within the last 10 years: No If all of the above answers are NO, then may proceed with Cephalosporin use.   Simvastatin Other (See Comments)   Muscle weakness   Meclizine     Felt like her heart was pounding, made me crazy, didn't help the dizziness   Meclizine  Hcl    Other reaction(s): jittery, heart racing   Other Other (See Comments)   Byetta 10 Mcg Pen [exenatide] Nausea Only        Medication List     PAUSE taking these medications    aspirin  EC 81 MG tablet Wait to take this until: March 24, 2024 Take 1 tablet (81 mg total) by mouth at bedtime. Swallow whole. Start taking on: March 24, 2024 What changed: These instructions start on March 24, 2024. If you are unsure what to do until then, ask your doctor or other care provider.       STOP taking these medications    clindamycin  150 MG capsule Commonly known as: CLEOCIN    losartan -hydrochlorothiazide  100-25 MG tablet Commonly known as: HYZAAR   traMADol  50 MG tablet Commonly known as: ULTRAM         TAKE these medications    acetaminophen  325 MG tablet Commonly known as: TYLENOL  Take 2 tablets (650 mg total) by mouth every 6 (six) hours as needed for mild pain (pain score 1-3), fever, moderate pain (pain score 4-6) or headache (or Fever >/= 101).   ALPRAZolam  0.5 MG tablet Commonly known as: XANAX  Take 1 tablet (0.5 mg total) by mouth at bedtime as needed for sleep. What changed:  medication strength how much to take   buPROPion  300 MG 24 hr tablet Commonly known as: Wellbutrin  XL Take 1 tablet (300 mg total) by mouth daily. What changed: when to take this   CALCIUM  1200 PO Take 1 capsule by mouth at bedtime.   carvedilol  12.5 MG tablet Commonly known as: COREG  Take 12.5 mg by mouth 2 (two) times daily with a meal.   doxylamine (Sleep) 25 MG tablet Commonly known as: UNISOM Take 25 mg by mouth at bedtime as needed for sleep.   escitalopram  5 MG tablet Commonly known as: Lexapro  Take 1 tablet (5 mg total) by mouth daily. What changed: when to take this   gabapentin  300 MG capsule Commonly known as: NEURONTIN   TAKE 2 CAPSULES 3 TIMES A  DAY What changed: See the new instructions.   HYDROcodone -acetaminophen  5-325 MG tablet Commonly known as: NORCO/VICODIN Take 1 tablet by mouth every 12 (twelve) hours as needed for up to 2 days for severe pain (pain score 7-10) (use if home tramadol  ineffective). What changed:  when to take this reasons to take this   lamoTRIgine  50 MG 24 hour tablet Commonly known as: LAMICTAL  XR Please increase your Lamictal  dose from 100mg  to 150mg  a day by adding a 50mg  pill to your current 100mg  pill. In 2 weeks, if no side effects, may increase to 200mg  daily by adding two 50mg  pills to your current 100mg  pill. Stop for any side effects especially rash. What changed:  how much to take how to take this when to take this additional instructions   lamoTRIgine  100 MG 24 hour tablet Commonly known as: LAMICTAL  XR Take 100 mg by mouth  daily. What changed: Another medication with the same name was changed. Make sure you understand how and when to take each.   lidocaine -prilocaine  cream Commonly known as: EMLA  APPLY TOPICALLY 1 APPLICATION AS NEEDED What changed: See the new instructions.   losartan  25 MG tablet Commonly known as: Cozaar  Take 0.5 tablets (12.5 mg total) by mouth daily.   magnesium  oxide 400 MG tablet Commonly known as: MAG-OX Take 400 mg by mouth in the morning.   MAGNESIUM  PO Take 1 capsule by mouth at bedtime. Magtein - Magnesium  L'threonate   MILK OF MAGNESIA PO Take 1 Dose by mouth as needed.   Mounjaro  15 MG/0.5ML Pen Generic drug: tirzepatide  Inject 15 mg into the skin once a week. What changed: additional instructions   NONFORMULARY OR COMPOUNDED ITEM Compound Cream 7A (allow alternate if needed for insurance): Amantadine 8%, Baclofen 2%, Gabapentin  6%, Amitriptyline  4%, Bupivacaine  2%, Clonidine 0.2%, Nifedipine 2%.   Instructions: Apply 1-2 grams to the affected area 3-4 times daily. What changed:  how much to take how to take this when to take this reasons to take this   OCUVITE-LUTEIN PO Take 1 tablet by mouth in the morning.   Qutenza  (4 Patch) 8 % Generic drug: capsaicin  topical system FOR OFFICE ADMINISTRATION BY YOUR PHYSICIAN What changed: See the new instructions.   rosuvastatin  20 MG tablet Commonly known as: CRESTOR  Take 1 tablet (20 mg total) by mouth at bedtime.   SUPER B COMPLEX/C PO Take 1 capsule by mouth in the morning.   urea  10 % cream Commonly known as: CARMOL APPLY TO AFFECTED AREA EVERY DAY AS NEEDED What changed: See the new instructions.   Vitamin D3 125 MCG (5000 UT) Caps Take 1 capsule by mouth at bedtime.               Durable Medical Equipment  (From admission, onward)           Start     Ordered   03/14/24 1240  For home use only DME Walker rolling  Once       Question Answer Comment  Walker: With 5 Inch Wheels    Patient needs a walker to treat with the following condition Gait instability      03/14/24 1239            Follow-up Information     [facility].   Specialty: Home Health Services Why: Someone will call you to schedule first home visit. Contact information: [address] [place] [address] [phone]  Cleotilde Planas, MD Follow up on 03/30/2024.   Specialty: Family Medicine Why: Hospital Follow-up, Time 10:00, Please arrive 15 min early to complete paperwork please check bmp to follow up on her sodium Contact information: [address] [place] [address] [phone]                Allergies  Allergen Reactions   Ambien [Zolpidem] Other (See Comments)    NIGHT TERRORS   Atorvastatin  Other (See Comments)    Muscle weakness Muscle Aches   Codeine Nausea And Vomiting   Penicillins Itching and Other (See Comments)    Made nose run uncontrollably Has patient had a PCN reaction causing immediate rash, facial/tongue/throat swelling, SOB or lightheadedness with hypotension: No Has patient had a PCN reaction causing severe rash involving mucus membranes or skin necrosis: No Has patient had a PCN reaction that required hospitalization No Has patient had a PCN reaction occurring within the last 10 years: No If all of the above answers are NO, then may proceed with Cephalosporin use.   Simvastatin Other (See Comments)    Muscle weakness   Meclizine      Felt like her heart was pounding, made me crazy, didn't help the dizziness   Meclizine  Hcl     Other reaction(s): jittery, heart racing   Other Other (See Comments)   Byetta 10 Mcg Pen [Exenatide] Nausea Only    Consultations: none   Procedures/Studies: MR BRAIN WO CONTRAST Result Date: 03/13/2024 CLINICAL DATA:  Initial evaluation for acute headache. EXAM: MRI HEAD WITHOUT CONTRAST TECHNIQUE: Multiplanar, multiecho pulse sequences of the brain and  surrounding structures were obtained without intravenous contrast. COMPARISON:  Prior CT from earlier the same day as well as previous exams. FINDINGS: Brain: Examination mildly limited due to susceptibility artifact emanating from dental hardware. Generalized age-related cerebral atrophy. Extensive patchy and confluent T2/FLAIR hyperintensity involving the periventricular and deep white matter both cerebral hemispheres as well as the pons, most likely related to chronic microvascular ischemic disease, moderately advanced in nature. Few scatter remote lacunar infarcts present about the left frontal corona radiata and left thalamus. Scattered evolving multifocal parenchymal contusions involving the right greater than left frontal lobes and right temporal lobe. Mild localized edema without significant regional mass effect. Small subdural hematoma overlying the left cerebral convexity measures up to 3 mm in maximal thickness without significant mass effect or midline shift. Overall, appearance of these changes is relatively similar to prior. No other acute intracranial hemorrhage. Focus of chronic hemosiderin staining involving the right occipital lobe noted, stable from prior. Few additional scattered chronic micro hemorrhages noted involving the supratentorial cerebral white matter, likely hypertensive in nature. 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 No other mass lesion, mass effect, or midline shift. No hydrocephalus or extra-axial fluid collection. Vascular: Major major intracranial vascular flow voids are maintained. Skull and upper cervical spine: Craniocervical junction within normal limits. Bone marrow signal intensity normal. No scalp soft tissue abnormality. Sinuses/Orbits: Prior bilateral ocular lens replacement. Paranasal sinuses are largely clear. No significant mastoid effusion. Other: None. IMPRESSION: 1. Evolving multifocal parenchymal  contusions involving the right greater than left frontal lobes and right temporal lobe, relatively stable from prior. Mild localized edema without significant regional mass effect. 2. Small subdural hematoma overlying the left cerebral convexity measuring up to 3 mm in maximal thickness without significant mass effect or midline shift. 3. Underlying age-related cerebral atrophy with moderate chronic microvascular ischemic disease, with a few  scattered remote lacunar infarcts involving the left frontal corona radiata and left thalamus. 4. Grossly stable appearance of patient's known pituitary adenoma extending into the sphenoid sinuses. Electronically Signed   By: Morene Hoard M.D.   On: 03/13/2024 22:39   CT Head Wo Contrast Result Date: 03/13/2024 CLINICAL DATA:  Head trauma, moderate-severe recent, ICH, worsening headache EXAM: CT HEAD WITHOUT CONTRAST TECHNIQUE: Contiguous axial images were obtained from the base of the skull through the vertex without intravenous contrast. RADIATION DOSE REDUCTION: This exam was performed according to the departmental dose-optimization program which includes automated exposure control, adjustment of the mA and/or kV according to patient size and/or use of iterative reconstruction technique. COMPARISON:  CTHead March 11, 2024. FINDINGS: Brain: Unchanged multifocal hemorrhagic intraparenchymal contusions, worst in the right frontal lobe. Similar small volume subarachnoid hemorrhage along the right frontal convexity and sylvian fissure. Similar small volume of subdural hemorrhage along the right tentorial leaflet. No midline shift. No hydrocephalus. Similar patchy white matter hypodensities, compatible with chronic microvascular ischemic disease. No evidence of acute large vascular territory infarct. Vascular: No hyperdense vessel. Skull: No acute fracture. Sinuses/Orbits: Left sphenoid sinus mucosal thickening remaining sinuses are clear. No acute orbital findings.  Other: No mastoid effusions. IMPRESSION: 1. Unchanged multifocal traumatic intraparenchymal and extra-axial hemorrhage. No midline shift or progressive mass effect. 2. No hydrocephalus. Electronically Signed   By: Gilmore GORMAN Molt M.D.   On: 03/13/2024 10:41   CT HEAD WO CONTRAST Result Date: 03/11/2024 CLINICAL DATA:  Traumatic brain injury EXAM: CT HEAD WITHOUT CONTRAST TECHNIQUE: Contiguous axial images were obtained from the base of the skull through the vertex without intravenous contrast. RADIATION DOSE REDUCTION: This exam was performed according to the departmental dose-optimization program which includes automated exposure control, adjustment of the mA and/or kV according to patient size and/or use of iterative reconstruction technique. COMPARISON:  03/10/2024 head CT FINDINGS: Brain: Unchanged bilateral multifocal hemorrhagic intraparenchymal contusions, worst in the right frontal lobe. Small volume subarachnoid hemorrhage in the right sylvian fissure is unchanged. No hydrocephalus or mass effect. Confluent white matter hypoattenuation. Small amount of subdural blood along the right tentorial leaflet is unchanged. Thin left convexity subdural hematoma is unchanged. Vascular: Carotid atherosclerosis Skull: Negative Sinuses/Orbits: No acute finding Other: None. IMPRESSION: 1. Unchanged pattern of traumatic intraparenchymal and extra-axial hemorrhage. 2. No midline shift or other mass effect.  No hydrocephalus. Electronically Signed   By: Franky Stanford M.D.   On: 03/11/2024 01:35   CT Head Wo Contrast Addendum Date: 03/10/2024 ADDENDUM REPORT: 03/10/2024 12:42 ADDENDUM: Study discussed by telephone with PA LESLIE SOFIA on 03/10/2024 at 1221 hours. Electronically Signed   By: VEAR Hurst M.D.   On: 03/10/2024 12:42   Result Date: 03/10/2024 CLINICAL DATA:  73 year old female status post fall with loss of consciousness yesterday. Subsequent headache, blurred vision, pain. EXAM: CT HEAD WITHOUT CONTRAST  TECHNIQUE: Contiguous axial images were obtained from the base of the skull through the vertex without intravenous contrast. RADIATION DOSE REDUCTION: This exam was performed according to the departmental dose-optimization program which includes automated exposure control, adjustment of the mA and/or kV according to patient size and/or use of iterative reconstruction technique. COMPARISON:  Head CT 05/21/2023.  Brain MRI 05/09/2022. FINDINGS: Brain: Acute multifocal intracranial hemorrhage which appears to be posttraumatic. See details below. No ventriculomegaly. Basilar cisterns remain patent. Patchy and confluent bilateral cerebral white matter hypodensity superimposed and stable. No cortically based acute infarct identified. Vascular: Calcified atherosclerosis at the skull base. No suspicious intracranial vascular hyperdensity.  Skull: Stable and intact.  No acute osseous abnormality identified. Sinuses/Orbits: Chronic paranasal sinus mucoperiosteal thickening is stable from last year, sinuses, tympanic cavities and mastoids remain well aerated. Other: No acute orbit or scalp soft tissue injury identified. Traumatic Brain Injury Risk Stratification Skull Fracture: No - Low/mBIG 1 Subdural Hematoma (SDH): 4mm to <109mm - mBIG 2 Small bilateral low to mixed density SDH visible at the vertex (coronal image 31), up to 4 mm on the left and smaller on the right. Subarachnoid Hemorrhage [facility]): multifocal, bilateral - High/mBIG 3 Trace at the posterior right sylvian fissure, but additional bilateral globular subarachnoid blood over both anterior superior frontal gyri (on the left coronal image 55). Epidural Hematoma (EDH): No - Low/mBIG 1 Cerebral contusion, intra-axial, intraparenchymal Hemorrhage (IPH): 8mm plus or multiple - High/mBIG 3 Numerous anterior frontal lobe white matter and gray-white junction subcentimeter hemorrhages, occasional on the left also (series 3, image 15) and 1.7 cm right posterior temporal lobe  hemorrhagic contusion. Intraventricular Hemorrhage (IVH): No - Low/mBIG 1 Midline Shift > 1mm or Edema/effacement of sulci/vents: Yes - High/mBIG 3 Rightward midline shift of 3-4 mm. ---------------------------------------------------- IMPRESSION: 1. Positive for multifocal posttraumatic Acute Intracranial Hemorrhage: - multifocal bilateral intraparenchymal hemorrhages (appear to be a combination of shear hemorrhages and hemorrhagic contusion) - small volume bilateral SAH. - small bilateral vertex Subdural Hematomas, up to 4 mm on the left. 2. Rightward midline shift of 3-4 mm.  Normal basilar cisterns. 3. No skull fracture identified. Electronically Signed: By: VEAR Hurst M.D. On: 03/10/2024 12:14   CT Cervical Spine Wo Contrast Result Date: 03/10/2024 CLINICAL DATA:  73 year old female status post fall with loss of consciousness yesterday. Subsequent headache, blurred vision, pain. EXAM: CT CERVICAL SPINE WITHOUT CONTRAST TECHNIQUE: Multidetector CT imaging of the cervical spine was performed without intravenous contrast. Multiplanar CT image reconstructions were also generated. RADIATION DOSE REDUCTION: This exam was performed according to the departmental dose-optimization program which includes automated exposure control, adjustment of the mA and/or kV according to patient size and/or use of iterative reconstruction technique. COMPARISON:  Head CT today.  Cervical spine MRI 11/12/2022. FINDINGS: Alignment: Straightening of cervical lordosis since last year. Cervicothoracic junction alignment is within normal limits. Bilateral posterior element alignment is within normal limits. Skull base and vertebrae: Bone mineralization is within normal limits for age. Visualized skull base is intact. No atlanto-occipital dissociation. C1 and C2 appear intact and aligned. No acute osseous abnormality identified. Soft tissues and spinal canal: No prevertebral fluid or swelling. No visible canal hematoma. Negative visible  noncontrast neck soft tissues aside from calcified carotid atherosclerosis. Disc levels: Chronic disc and endplate degeneration is advanced C3-C4 through C6-C7, including vacuum disc. Stable by CT from the MRI last year. Upper chest: Visible upper thoracic levels appear intact, negative visible noncontrast thoracic inlet. IMPRESSION: 1. No acute traumatic injury identified in the cervical spine. 2. 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 Electronically Signed   By: VEAR Hurst M.D.   On: 03/10/2024 12:17   (Echo, Carotid, EGD, Colonoscopy, ERCP)    Subjective:  Feels better ambulating without dizziness Headache better No blurring vision  Discharge Exam: Vitals:   03/16/24 0900 03/16/24 0947  BP: (!) 129/93 (!) 129/93  Pulse: 76   Resp: 16   Temp:    SpO2:     Vitals:   03/15/24 1721 03/15/24 2000 03/16/24 0900 03/16/24 0947  BP: 124/64 (!) 128/90 (!) 129/93 (!) 129/93  Pulse: (!) 59 70 76   Resp:  16   Temp:      TempSrc:      SpO2:      Weight:      Height:       Exam by sarah hassell paramedic General: Pt is alert, awake, not in acute distress Cardiovascular: reg Respiratory:not tachycardic Abdominal: Soft Extremities: no edema   The results of significant diagnostics from this hospitalization (including imaging, microbiology, ancillary and laboratory) are listed below for reference.     Microbiology: No results found for this or any previous visit (from the past 240 hours).   Labs: BNP (last 3 results) No results for input(s): BNP in the last 8760 hours. Basic Metabolic Panel: Recent Labs  Lab 03/10/24 1847 03/13/24 0957 03/13/24 1835 03/14/24 0507 03/14/24 1020 03/14/24 1805 03/15/24 1015 03/15/24 1725 03/16/24 0955  NA 128* 124* 122* 124* 127* 125* 127*  126* 126* 130*  K 3.5 3.5 3.4* 3.6  --   --  3.6  --   --   CL 88* 87* 89* 91*  --   --  94*  --   --   CO2 31 22 21* 24  --   --  24  --   --   GLUCOSE 106* 103* 230* 87   --   --  97  --   --   BUN 18 19 20  24*  --   --  20  --   --   CREATININE 1.06* 0.94 1.03* 1.16*  --   --  1.05*  --   --   CALCIUM  9.2 9.2 8.8* 8.4*  --   --  8.5*  --   --    Liver Function Tests: Recent Labs  Lab 03/10/24 1847 03/15/24 1015  AST 23 17  ALT 22 13  ALKPHOS 68 60  BILITOT 0.9 0.7  PROT 6.3* 5.9*  ALBUMIN 3.6 3.4*   No results for input(s): LIPASE, AMYLASE in the last 168 hours. No results for input(s): AMMONIA in the last 168 hours. CBC: Recent Labs  Lab 03/10/24 1230 03/13/24 0957 03/14/24 0507 03/15/24 1015  WBC 7.5 10.8* 8.2 6.6  NEUTROABS 5.3 9.0*  --   --   HGB 14.5 14.4 13.0 12.5  HCT 41.9 40.4 37.2 36.3  MCV 89.1 87.4 88.6 88.5  PLT 230 210 165 168   Cardiac Enzymes: No results for input(s): CKTOTAL, CKMB, CKMBINDEX, TROPONINI in the last 168 hours. BNP: Invalid input(s): POCBNP CBG: Recent Labs  Lab 03/13/24 1639 03/13/24 1837 03/13/24 2117 03/14/24 0732 03/14/24 1137  GLUCAP 110* 267* 103* 91 76   D-Dimer No results for input(s): DDIMER in the last 72 hours. Hgb A1c No results for input(s): HGBA1C in the last 72 hours. Lipid Profile No results for input(s): CHOL, HDL, LDLCALC, TRIG, CHOLHDL, LDLDIRECT in the last 72 hours. Thyroid  function studies No results for input(s): TSH, T4TOTAL, T3FREE, THYROIDAB in the last 72 hours.  Invalid input(s): FREET3 Anemia work up No results for input(s): VITAMINB12, FOLATE, FERRITIN, TIBC, IRON , RETICCTPCT in the last 72 hours. Urinalysis    Component Value Date/Time   COLORURINE STRAW (A) 06/04/2019 1109   APPEARANCEUR CLEAR 06/04/2019 1109   LABSPEC 1.004 (L) 06/04/2019 1109   PHURINE 7.0 06/04/2019 1109   GLUCOSEU NEGATIVE 06/04/2019 1109   HGBUR NEGATIVE 06/04/2019 1109   BILIRUBINUR NEGATIVE 06/04/2019 1109   KETONESUR NEGATIVE 06/04/2019 1109   PROTEINUR NEGATIVE 06/04/2019 1109   UROBILINOGEN 0.2 04/19/2007 1413   NITRITE  NEGATIVE 06/04/2019 1109  LEUKOCYTESUR NEGATIVE 06/04/2019 1109   Sepsis Labs Recent Labs  Lab 03/10/24 1230 03/13/24 0957 03/14/24 0507 03/15/24 1015  WBC 7.5 10.8* 8.2 6.6   Microbiology No results found for this or any previous visit (from the past 240 hours).   Time coordinating discharge: 39 minutes  SIGNED:   Almarie KANDICE Hoots, MD  [facility] 03/16/2024, 1:49 PM

## 2024-03-16 NOTE — TOC Transition Note (Signed)
 Transition of Care [facility]) - Discharge Note   Patient Details  Name: [name] [name] MRN: [id number] Date of Birth: 1951/01/04  Transition of Care [facility]) CM/SW Contact:  Debarah Saunas, RN Phone Number: 03/16/2024, 1:04 PM   Clinical Narrative:    73 yo patient admitted and treated for hyponatremia after presenting with severe nausea and retching without active vomiting. RNCM ensured home health agency aware of potential discharge today and secured follow-up PCP appointment.  Final next level of care: Home w Home Health Services Barriers to Discharge: Barriers Resolved   Patient Goals and CMS Choice Patient states their goals for this hospitalization and ongoing recovery are:: remain in home with home health services   Choice offered to / list presented to : Patient      Discharge Placement  Home with home health                     Discharge Plan and Services Additional resources added to the After Visit Summary for                            [facility] Arranged: PT [facility] Agency: [facility] Date [facility] Agency Contacted: 03/14/24      Social Drivers of Health (SDOH) Interventions SDOH Screenings   Food Insecurity: No Food Insecurity (03/14/2024)  Housing: Low Risk  (03/14/2024)  Transportation Needs: No Transportation Needs (03/14/2024)  Utilities: Not At Risk (03/14/2024)  Financial Resource Strain: Patient Declined (02/07/2023)   Received from [facility]  Physical Activity: Sufficiently Active (02/07/2023)   Received from [facility]  Social Connections: Moderately Integrated (03/14/2024)  Stress: Patient Declined (02/07/2023)   Received from [facility]  Tobacco Use: Medium Risk (03/14/2024)     Readmission Risk Interventions    03/14/2024   12:45 PM  Readmission Risk Prevention Plan  Transportation Screening Complete  PCP or Specialist Appt within 5-7 Days Complete  Home Care Screening Complete  Medication Review (RN CM) Referral to Pharmacy

## 2024-03-16 NOTE — Plan of Care (Signed)

## 2024-03-16 NOTE — Progress Notes (Signed)
 AVS form given to patient. Discharge instructions given. PIV removed. No questions at this time.

## 2024-03-16 NOTE — Plan of Care (Signed)
  Problem: Coping: Goal: Ability to adjust to condition or change in health will improve Outcome: Progressing   Problem: Activity: Goal: Risk for activity intolerance will decrease Outcome: Progressing   Problem: Clinical Measurements: Goal: Ability to maintain clinical measurements within normal limits will improve Outcome: Progressing Goal: Will remain free from infection Outcome: Progressing Goal: Diagnostic test results will improve Outcome: Progressing Goal: Respiratory complications will improve Outcome: Progressing Goal: Cardiovascular complication will be avoided Outcome: Progressing

## 2024-03-16 NOTE — Progress Notes (Signed)
 Current health data remained with in normal parameters without no additional alerts or need for outreach to RN.

## 2024-03-16 NOTE — Discharge Planning (Signed)
 RNCM consulted regarding PCP establishment. Pt presented to [facility] ED today with hyponatremai. RNCM met with pt at bedside; pt confirms not having access to follow up care with PCP . Discussed with patient importance and benefits of establishing PCP, and not utilizing the ED for primary care needs. Pt verbalized understanding and is in agreement. Discussed other options, provided list of PCPs accepting new pts. RNCM obtained appointment on March 30, 2024  with  Olam Pinal, MD and placed on After Visit Summary paperwork.  No further case management needs communicated at this time. Tami Blass J. Debarah, BSN,  RN, [facility] [phone]

## 2024-03-20 ENCOUNTER — Encounter: Payer: Self-pay | Admitting: Neurology

## 2024-03-20 DIAGNOSIS — N1831 Chronic kidney disease, stage 3a: Secondary | ICD-10-CM | POA: Diagnosis not present

## 2024-03-20 DIAGNOSIS — E1140 Type 2 diabetes mellitus with diabetic neuropathy, unspecified: Secondary | ICD-10-CM | POA: Diagnosis not present

## 2024-03-20 DIAGNOSIS — Z9884 Bariatric surgery status: Secondary | ICD-10-CM | POA: Diagnosis not present

## 2024-03-20 DIAGNOSIS — E663 Overweight: Secondary | ICD-10-CM | POA: Diagnosis not present

## 2024-03-20 DIAGNOSIS — I152 Hypertension secondary to endocrine disorders: Secondary | ICD-10-CM | POA: Diagnosis not present

## 2024-03-20 DIAGNOSIS — Z6829 Body mass index (BMI) 29.0-29.9, adult: Secondary | ICD-10-CM | POA: Diagnosis not present

## 2024-03-20 DIAGNOSIS — Z8639 Personal history of other endocrine, nutritional and metabolic disease: Secondary | ICD-10-CM | POA: Diagnosis not present

## 2024-03-20 DIAGNOSIS — F3289 Other specified depressive episodes: Secondary | ICD-10-CM | POA: Diagnosis not present

## 2024-03-21 DIAGNOSIS — H353132 Nonexudative age-related macular degeneration, bilateral, intermediate dry stage: Secondary | ICD-10-CM | POA: Diagnosis not present

## 2024-03-21 DIAGNOSIS — R2681 Unsteadiness on feet: Secondary | ICD-10-CM | POA: Diagnosis not present

## 2024-03-21 DIAGNOSIS — H538 Other visual disturbances: Secondary | ICD-10-CM | POA: Diagnosis not present

## 2024-03-21 DIAGNOSIS — M6281 Muscle weakness (generalized): Secondary | ICD-10-CM | POA: Diagnosis not present

## 2024-03-21 DIAGNOSIS — H35373 Puckering of macula, bilateral: Secondary | ICD-10-CM | POA: Diagnosis not present

## 2024-03-21 DIAGNOSIS — R519 Headache, unspecified: Secondary | ICD-10-CM | POA: Diagnosis not present

## 2024-03-23 ENCOUNTER — Telehealth: Payer: Self-pay | Admitting: Neurology

## 2024-03-23 ENCOUNTER — Encounter: Payer: Self-pay | Admitting: Neurology

## 2024-03-23 ENCOUNTER — Telehealth (INDEPENDENT_AMBULATORY_CARE_PROVIDER_SITE_OTHER): Admitting: Neurology

## 2024-03-23 ENCOUNTER — Encounter: Payer: Self-pay | Admitting: *Deleted

## 2024-03-23 DIAGNOSIS — H538 Other visual disturbances: Secondary | ICD-10-CM

## 2024-03-23 DIAGNOSIS — S065XAA Traumatic subdural hemorrhage with loss of consciousness status unknown, initial encounter: Secondary | ICD-10-CM

## 2024-03-23 DIAGNOSIS — S069X1A Unspecified intracranial injury with loss of consciousness of 30 minutes or less, initial encounter: Secondary | ICD-10-CM

## 2024-03-23 DIAGNOSIS — R519 Headache, unspecified: Secondary | ICD-10-CM | POA: Diagnosis not present

## 2024-03-23 NOTE — Patient Instructions (Signed)
 Concussion, Adult  A concussion is a brain injury from a hard, direct hit (trauma) to the head or body. This direct hit causes the brain to shake quickly back and forth inside the skull. This can damage brain cells and cause chemical changes in the brain. A concussion may also be known as a mild traumatic brain injury (TBI). The effects of a concussion can be serious. If you have a concussion, you should be very careful to avoid having a second concussion. What are the causes? This condition is caused by: A direct hit to your head. Sudden movement of your body that causes your brain to move back and forth inside the skull, such as in a car crash. What are the signs or symptoms? The signs of a concussion can be hard to notice. Early on, they may be missed by you, family members, and health care providers. You may look fine on the outside but may act or feel differently. Every head injury is different. Symptoms are usually temporary but may last for days, weeks, or even months. Some symptoms appear right away, but other symptoms may not show up for hours or days. Physical symptoms Headaches. Dizziness and problems with coordination or balance. Sensitivity to light or noise. Nausea or vomiting. Tiredness (fatigue). Vision or hearing problems. Seizure. Mental and emotional symptoms Irritability or mood changes. Memory problems. Trouble concentrating, organizing, or making decisions. Changes in eating or sleeping patterns. Slowness in thinking, acting or reacting, speaking, or reading. Anxiety or depression. How is this diagnosed? This condition is diagnosed based on your symptoms and injury. You may also have tests, including: Imaging tests, such as a CT scan or an MRI. Neuropsychological tests. These measure your thinking, understanding, learning, and memory. How is this treated? Treatment for this condition includes: Stopping sports or activity if you are injured. Physical and mental  rest and careful observation, usually at home. Medicines to help with symptoms such as headaches, nausea, or difficulty sleeping. Referral to a concussion clinic or rehab center. Follow these instructions at home: Activity Limit activities that require a lot of thought or concentration, such as: Doing homework or job-related work. Watching TV. Using the computer or phone. Playing memory games and doing puzzles. Rest helps your brain heal. Make sure you: Get plenty of sleep. Most adults should get 7-9 hours of sleep each night. Rest during the day. Take naps or rest breaks when you feel tired. Avoid high-intensity exercise or physical activities that take a lot of effort. Stop any activity that worsens symptoms. Your health care provider may recommend light exercise such as walking. Do not do high-risk activities that could cause a second concussion, such as riding a bike or playing sports. Ask your health care provider when you can return to your normal activities, such as school, work, sports, and driving. Your ability to react may be slower after a brain injury. Never do these activities if you are dizzy. General instructions  Take over-the-counter and prescription medicines only as told by your health care provider. Some medicines, such as blood thinners (anticoagulants) and aspirin, may increase the risk for complications, such as bleeding. Avoid taking opioid pain medicine while recovering from a concussion. Do not drink alcohol until your health care provider says you can. Drinking alcohol may slow your recovery and can put you at risk of further injury. Watch your symptoms and tell others around you to do the same. Complications sometimes occur after a concussion. Tell your work Production designer, theatre/television/film, teachers, Tax adviser,  school counselor, coach, or sports trainer about your injury, symptoms, and restrictions. See a mental health therapist if you feel anxious or depressed. Managing this condition  can be challenging. Keep all follow-up visits. Your health care provider will check on your recovery and give you a plan for returning to activities. How is this prevented? Avoiding another brain injury is very important. In rare cases, another injury can lead to permanent brain damage, brain swelling, or death. The risk of this is greatest during the first 7-10 days after a head injury. Avoid injuries by: Stopping activities that could lead to a second concussion, such as contact or recreational sports, until your health care provider says it is okay. Taking these actions once you have returned to sports or activities: Avoid plays or moves that can cause you to crash into another person. This is how most concussions occur. Follow the rules and be respectful of other players. Do not engage in violent or illegal plays. Getting regular exercise that includes strength and balance training. Wearing a properly fitting helmet during sports, biking, or other activities. Helmets can help protect you from serious skull and brain injuries, but they may not protect you from a concussion. Even when wearing a helmet, you should avoid being hit in the head. Where to find more information Centers for Disease Control and Prevention: [employer]no Contact a health care provider if: Your symptoms do not improve or get worse. You have new symptoms. You have another injury. Your coordination gets worse. You have unusual behavior changes. Get help right away if: You have a severe or worsening headache. You have weakness or numbness in any part of your body, slurred speech, vision changes, or confusion. You vomit repeatedly. You lose consciousness, are sleepier than normal, or are difficult to wake up. You have a seizure. These symptoms may be an emergency. Get help right away. Call 911. Do not wait to see if the symptoms will go away. Do not drive yourself to the hospital. Also, get help right away if: You have  thoughts of hurting yourself or others. Take one of these steps if you feel like you may hurt yourself or others, or have thoughts about taking your own life: Go to your nearest emergency room. Call 911. Call the National Suicide Prevention Lifeline at 873 654 9351 or 988. This is open 24 hours a day. Text the Crisis Text Line at 859 396 5248. This information is not intended to replace advice given to you by your health care provider. Make sure you discuss any questions you have with your health care provider. Document Revised: 01/30/2022 Document Reviewed: 01/30/2022 Elsevier Patient Education  2024 [employer].

## 2024-03-23 NOTE — Progress Notes (Signed)
 [name]    Provider:  Dr [name] Requesting Provider: Claudene Lacks, MD Primary Care Provider:  Claudene Lacks, MD  CC:  head trauma  Virtual Visit via Telephone Note  I connected with [name] [name] on 03/23/2024 at  7:30 AM EDT by telephone and verified that I am speaking with the correct person using two identifiers.Patient could not connect on video.   Location: Patient: home Provider: office   I discussed the limitations, risks, security and privacy concerns of performing an evaluation and management service by telephone and the availability of in person appointments. I also discussed with the patient that there may be a patient responsible charge related to this service. The patient expressed understanding and agreed to proceed.   History of Present Illness:      I discussed the assessment and treatment plan with the patient. The patient was provided an opportunity to ask questions and all were answered. The patient agreed with the plan and demonstrated an understanding of the instructions.   The patient was advised to call back or seek an in-person evaluation if the symptoms worsen or if the condition fails to improve as anticipated.  I provided 42 minutes of non-face-to-face time during this encounter.   [name] KATHEE Ines, MD   HPI:  [name] [name] is a 73 y.o. female here as requested by Claudene Lacks, MD for headache after a fall and head trauma. She knocked herself out with confusion. Her friend found her laying on the floor unconscious. Patient wasin the entryway.  She saw her eye doctor and exam was normal. She reports continued headache and vision changes. She feels better as far as fatigue and weakness. She feels she has pressure across the top of the head and not in the temples or forehead like someone has a hook on the top of her head. Tylenol  helps. But the headache and visual changes continue, continuous, 4/5 pain. The biggest issue is the  blurred vision for patient. Patient is very concerned. She is sleeping a lot. No dizziness. No sensitivity to light or noise but very bright light may make the blurry vision worse. Fatigue. No mood changes. She has some memory/concentration problems takes her longer to finish sentences. Blurry vision not double vision. Worse when trying to read she cannot focus in but watching TV is fine, or anything big or driving is not a problem but print is difficult difficulty focusing. She hs never had a concussion before.    Reviewed notes, labs and imaging from outside physicians, which showed:   MRI brain revuewe dimages and agree with the following IMPRESSION: 1. Evolving multifocal parenchymal contusions involving the right greater than left frontal lobes and right temporal lobe, relatively stable from prior. Mild localized edema without significant regional mass effect. 2. Small subdural hematoma overlying the left cerebral convexity measuring up to 3 mm in maximal thickness without significant mass effect or midline shift. 3. Underlying age-related cerebral atrophy with moderate chronic microvascular ischemic disease, with a few scattered remote lacunar infarcts involving the left frontal corona radiata and left thalamus. 4. Grossly stable appearance of patient's known pituitary adenoma extending into the sphenoid sinuses.  Rviewed labs of significance: chronically low sodium and cl, slightly rduced kidney fx, elevated glucose but hgba1c wnl, tsh nml,   Recent Results (from the past 2160 hours)  CBC with Differential     Status: None   Collection Time: 03/10/24 12:30 PM  Result Value Ref Range   WBC 7.5 4.0 -  10.5 K/uL   RBC 4.70 3.87 - 5.11 MIL/uL   Hemoglobin 14.5 12.0 - 15.0 g/dL   HCT 58.0 63.9 - 53.9 %   MCV 89.1 80.0 - 100.0 fL   MCH 30.9 26.0 - 34.0 pg   MCHC 34.6 30.0 - 36.0 g/dL   RDW 86.7 88.4 - 84.4 %   Platelets 230 150 - 400 K/uL   nRBC 0.0 0.0 - 0.2 %   Neutrophils  Relative % 71 %   Neutro Abs 5.3 1.7 - 7.7 K/uL   Lymphocytes Relative 19 %   Lymphs Abs 1.4 0.7 - 4.0 K/uL   Monocytes Relative 8 %   Monocytes Absolute 0.6 0.1 - 1.0 K/uL   Eosinophils Relative 1 %   Eosinophils Absolute 0.1 0.0 - 0.5 K/uL   Basophils Relative 1 %   Basophils Absolute 0.1 0.0 - 0.1 K/uL   Immature Granulocytes 0 %   Abs Immature Granulocytes 0.02 0.00 - 0.07 K/uL    Comment: Performed at [facility], [address] [place], [place] [address]  Protime-INR     Status: None   Collection Time: 03/10/24 12:30 PM  Result Value Ref Range   Prothrombin Time 12.4 11.4 - 15.2 seconds    Comment: SPECIMEN HEMOLYZED. HEMOLYSIS MAY AFFECT INTEGRITY OF RESULTS. SPOKE TO PAIGE PULLIAM, RN    INR 0.9 0.8 - 1.2    Comment: (NOTE) INR goal varies based on device and disease states. Performed at [facility], [address] [place], [place] [address]   Comprehensive metabolic panel with GFR     Status: Abnormal   Collection Time: 03/10/24  6:47 PM  Result Value Ref Range   Sodium 128 (L) 135 - 145 mmol/L   Potassium 3.5 3.5 - 5.1 mmol/L   Chloride 88 (L) 98 - 111 mmol/L   CO2 31 22 - 32 mmol/L   Glucose, Bld 106 (H) 70 - 99 mg/dL    Comment: Glucose reference range applies only to samples taken after fasting for at least 8 hours.   BUN 18 8 - 23 mg/dL   Creatinine, Ser 8.93 (H) 0.44 - 1.00 mg/dL   Calcium  9.2 8.9 - 10.3 mg/dL   Total Protein 6.3 (L) 6.5 - 8.1 g/dL   Albumin 3.6 3.5 - 5.0 g/dL   AST 23 15 - 41 U/L   ALT 22 0 - 44 U/L   Alkaline Phosphatase 68 38 - 126 U/L   Total Bilirubin 0.9 0.0 - 1.2 mg/dL   GFR, Estimated 56 (L) >60 mL/min    Comment: (NOTE) Calculated using the CKD-EPI Creatinine Equation (2021)    Anion gap 9 5 - 15    Comment: Performed at [facility], [address] [place], [place] [address]  Protime-INR     Status: None   Collection Time: 03/10/24  6:47 PM  Result Value Ref Range   Prothrombin Time 12.8 11.4 - 15.2  seconds   INR 0.9 0.8 - 1.2    Comment: (NOTE) INR goal varies based on device and disease states. Performed at [facility], [address], [place], [place] [address]   Basic metabolic panel     Status: Abnormal   Collection Time: 03/13/24  9:57 AM  Result Value Ref Range   Sodium 124 (L) 135 - 145 mmol/L   Potassium 3.5 3.5 - 5.1 mmol/L   Chloride 87 (L) 98 - 111 mmol/L   CO2 22 22 - 32  mmol/L   Glucose, Bld 103 (H) 70 - 99 mg/dL    Comment: Glucose reference range applies only to samples taken after fasting for at least 8 hours.   BUN 19 8 - 23 mg/dL   Creatinine, Ser 9.05 0.44 - 1.00 mg/dL   Calcium  9.2 8.9 - 10.3 mg/dL   GFR, Estimated >39 >39 mL/min    Comment: (NOTE) Calculated using the CKD-EPI Creatinine Equation (2021)    Anion gap 15 5 - 15    Comment: Performed at [facility], [address], [place], [place] [address]  CBC with Differential     Status: Abnormal   Collection Time: 03/13/24  9:57 AM  Result Value Ref Range   WBC 10.8 (H) 4.0 - 10.5 K/uL   RBC 4.62 3.87 - 5.11 MIL/uL   Hemoglobin 14.4 12.0 - 15.0 g/dL   HCT 59.5 63.9 - 53.9 %   MCV 87.4 80.0 - 100.0 fL   MCH 31.2 26.0 - 34.0 pg   MCHC 35.6 30.0 - 36.0 g/dL   RDW 86.9 88.4 - 84.4 %   Platelets 210 150 - 400 K/uL   nRBC 0.0 0.0 - 0.2 %   Neutrophils Relative % 85 %   Neutro Abs 9.0 (H) 1.7 - 7.7 K/uL   Lymphocytes Relative 8 %   Lymphs Abs 0.9 0.7 - 4.0 K/uL   Monocytes Relative 7 %   Monocytes Absolute 0.8 0.1 - 1.0 K/uL   Eosinophils Relative 0 %   Eosinophils Absolute 0.0 0.0 - 0.5 K/uL   Basophils Relative 0 %   Basophils Absolute 0.0 0.0 - 0.1 K/uL   Immature Granulocytes 0 %   Abs Immature Granulocytes 0.04 0.00 - 0.07 K/uL    Comment: Performed at [facility], [address], [place], [place] [address]  Hemoglobin A1c     Status: Abnormal   Collection Time: 03/13/24  9:57 AM  Result Value Ref Range   Hgb A1c MFr Bld 4.0 (L) 4.8 - 5.6 %    Comment:  (NOTE) Diagnosis of Diabetes The following HbA1c ranges recommended by the American Diabetes Association (ADA) may be used as an aid in the diagnosis of diabetes mellitus.  Hemoglobin             Suggested A1C NGSP%              Diagnosis  <5.7                   Non Diabetic  5.7-6.4                Pre-Diabetic  >6.4                   Diabetic  <7.0                   Glycemic control for                       adults with diabetes.     Mean Plasma Glucose 68.1 mg/dL    Comment: Performed at [facility], [address] [place], [place] [address]  Osmolality     Status: Abnormal   Collection Time: 03/13/24  1:15 PM  Result Value Ref Range   Osmolality 263 (L) 275 - 295 mOsm/kg    Comment: Performed at [facility], [address] [place], [place] [address]  TSH     Status:  None   Collection Time: 03/13/24  1:15 PM  Result Value Ref Range   TSH 1.218 0.350 - 4.500 uIU/mL    Comment: Performed by a 3rd Generation assay with a functional sensitivity of <=0.01 uIU/mL. Performed at [facility], [address] [place], [place] [address]   CBG monitoring, ED     Status: Abnormal   Collection Time: 03/13/24  4:39 PM  Result Value Ref Range   Glucose-Capillary 110 (H) 70 - 99 mg/dL    Comment: Glucose reference range applies only to samples taken after fasting for at least 8 hours.  Basic metabolic panel with GFR     Status: Abnormal   Collection Time: 03/13/24  6:35 PM  Result Value Ref Range   Sodium 122 (L) 135 - 145 mmol/L   Potassium 3.4 (L) 3.5 - 5.1 mmol/L   Chloride 89 (L) 98 - 111 mmol/L   CO2 21 (L) 22 - 32 mmol/L   Glucose, Bld 230 (H) 70 - 99 mg/dL    Comment: Glucose reference range applies only to samples taken after fasting for at least 8 hours.   BUN 20 8 - 23 mg/dL   Creatinine, Ser 8.96 (H) 0.44 - 1.00 mg/dL   Calcium  8.8 (L) 8.9 - 10.3 mg/dL   GFR, Estimated 58 (L) >60 mL/min    Comment: (NOTE) Calculated using the CKD-EPI Creatinine  Equation (2021)    Anion gap 12 5 - 15    Comment: Performed at [facility], [address], [place], [place] [address]  Glucose, capillary     Status: Abnormal   Collection Time: 03/13/24  6:37 PM  Result Value Ref Range   Glucose-Capillary 267 (H) 70 - 99 mg/dL    Comment: Glucose reference range applies only to samples taken after fasting for at least 8 hours.  Glucose, capillary     Status: Abnormal   Collection Time: 03/13/24  9:17 PM  Result Value Ref Range   Glucose-Capillary 103 (H) 70 - 99 mg/dL    Comment: Glucose reference range applies only to samples taken after fasting for at least 8 hours.  CBC     Status: None   Collection Time: 03/14/24  5:07 AM  Result Value Ref Range   WBC 8.2 4.0 - 10.5 K/uL   RBC 4.20 3.87 - 5.11 MIL/uL   Hemoglobin 13.0 12.0 - 15.0 g/dL   HCT 62.7 63.9 - 53.9 %   MCV 88.6 80.0 - 100.0 fL   MCH 31.0 26.0 - 34.0 pg   MCHC 34.9 30.0 - 36.0 g/dL   RDW 86.7 88.4 - 84.4 %   Platelets 165 150 - 400 K/uL   nRBC 0.0 0.0 - 0.2 %    Comment: Performed at [facility], [address], [place], [place] [address]  Basic metabolic panel     Status: Abnormal   Collection Time: 03/14/24  5:07 AM  Result Value Ref Range   Sodium 124 (L) 135 - 145 mmol/L   Potassium 3.6 3.5 - 5.1 mmol/L   Chloride 91 (L) 98 - 111 mmol/L   CO2 24 22 - 32 mmol/L   Glucose, Bld 87 70 - 99 mg/dL    Comment: Glucose reference range applies only to samples taken after fasting for at least 8 hours.   BUN 24 (H) 8 - 23 mg/dL   Creatinine, Ser 8.83 (H) 0.44 - 1.00 mg/dL   Calcium  8.4 (L) 8.9 - 10.3 mg/dL  GFR, Estimated 50 (L) >60 mL/min    Comment: (NOTE) Calculated using the CKD-EPI Creatinine Equation (2021)    Anion gap 9 5 - 15    Comment: Performed at [facility], [address], [place], [place] [address]  Glucose, capillary     Status: None   Collection Time: 03/14/24  7:32 AM  Result Value Ref Range   Glucose-Capillary 91 70 - 99 mg/dL     Comment: Glucose reference range applies only to samples taken after fasting for at least 8 hours.  Sodium     Status: Abnormal   Collection Time: 03/14/24 10:20 AM  Result Value Ref Range   Sodium 127 (L) 135 - 145 mmol/L    Comment: Performed at [facility], [address] [place], [place] [address]  Glucose, capillary     Status: None   Collection Time: 03/14/24 11:37 AM  Result Value Ref Range   Glucose-Capillary 76 70 - 99 mg/dL    Comment: Glucose reference range applies only to samples taken after fasting for at least 8 hours.  Sodium     Status: Abnormal   Collection Time: 03/14/24  6:05 PM  Result Value Ref Range   Sodium 125 (L) 135 - 145 mmol/L    Comment: Performed at [facility], [address] [place], [place] [address]  CBC     Status: None   Collection Time: 03/15/24 10:15 AM  Result Value Ref Range   WBC 6.6 4.0 - 10.5 K/uL   RBC 4.10 3.87 - 5.11 MIL/uL   Hemoglobin 12.5 12.0 - 15.0 g/dL   HCT 63.6 63.9 - 53.9 %   MCV 88.5 80.0 - 100.0 fL   MCH 30.5 26.0 - 34.0 pg   MCHC 34.4 30.0 - 36.0 g/dL   RDW 86.7 88.4 - 84.4 %   Platelets 168 150 - 400 K/uL   nRBC 0.0 0.0 - 0.2 %    Comment: Performed at [facility], [address], [place], [place] [address]  Comprehensive metabolic panel     Status: Abnormal   Collection Time: 03/15/24 10:15 AM  Result Value Ref Range   Sodium 127 (L) 135 - 145 mmol/L   Potassium 3.6 3.5 - 5.1 mmol/L   Chloride 94 (L) 98 - 111 mmol/L   CO2 24 22 - 32 mmol/L   Glucose, Bld 97 70 - 99 mg/dL    Comment: Glucose reference range applies only to samples taken after fasting for at least 8 hours.   BUN 20 8 - 23 mg/dL   Creatinine, Ser 8.94 (H) 0.44 - 1.00 mg/dL   Calcium  8.5 (L) 8.9 - 10.3 mg/dL   Total Protein 5.9 (L) 6.5 - 8.1 g/dL   Albumin 3.4 (L) 3.5 - 5.0 g/dL   AST 17 15 - 41 U/L   ALT 13 0 - 44 U/L   Alkaline Phosphatase 60 38 - 126 U/L   Total Bilirubin 0.7 0.0 - 1.2 mg/dL   GFR, Estimated 56 (L) >60  mL/min    Comment: (NOTE) Calculated using the CKD-EPI Creatinine Equation (2021)    Anion gap 9 5 - 15    Comment: Performed at [facility], [address] [place], [place] [address]  Sodium     Status: Abnormal   Collection Time: 03/15/24 10:15 AM  Result Value Ref Range   Sodium 126 (L) 135 - 145 mmol/L    Comment: Performed at [facility], [address] [place], [place] [address]  Sodium, urine, random     Status: None   Collection Time: 03/15/24  5:20 PM  Result Value Ref Range   Sodium, Ur <10 mmol/L    Comment: Performed at [facility], [address], [place], [place] [address]  Osmolality, urine     Status: None   Collection Time: 03/15/24  5:20 PM  Result Value Ref Range   Osmolality, Ur 626 300 - 900 mOsm/kg    Comment: REPEATED TO VERIFY Performed at [facility], [address] [place], [place] [address]   Sodium     Status: Abnormal   Collection Time: 03/15/24  5:25 PM  Result Value Ref Range   Sodium 126 (L) 135 - 145 mmol/L    Comment: Performed at [facility], [address], [place], [place] [address]  Sodium     Status: Abnormal   Collection Time: 03/16/24  9:55 AM  Result Value Ref Range   Sodium 130 (L) 135 - 145 mmol/L    Comment: Performed at [facility], [address] [place], [place] [address]    Review of Systems: Patient complains of symptoms per HPI as well as the following symptoms per hpi. Pertinent negatives and positives per HPI. All others negative.   Social History   Socioeconomic History   Marital status: Single    Spouse name: Not on file   Number of children: 0   Years of education: college   Highest education level: Not on file  Occupational History   Occupation: Retired  Tobacco Use   Smoking status: Former    Current packs/day: 0.00    Average packs/day: 1 pack/day for 40.0 years (40.0 ttl pk-yrs)    Types: Cigarettes    Start date: 08/1966    Quit date: 08/2006    Years  since quitting: 17.6   Smokeless tobacco: Never  Vaping Use   Vaping status: Never Used  Substance and Sexual Activity   Alcohol use: Yes    Alcohol/week: 2.0 standard drinks of alcohol    Types: 2 Glasses of wine per week    Comment: 07/06/2019 maybe 2 glasses of wine/week; 04/08/2016 5-6 drinks/year'   Drug use: No    Types: Marijuana    Comment: 04/08/2016 did some marijuana in college   Sexual activity: Not Currently    Birth control/protection: Surgical  Other Topics Concern   Not on file  Social History Narrative   Lives at home alone   Right-handed   Drinks 4 cups of coffee a day    Social Drivers of Health   Financial Resource Strain: Patient Declined (02/07/2023)   Received from [facility]   Overall Financial Resource Strain (CARDIA)    Difficulty of Paying Living Expenses: Patient declined  Food Insecurity: No Food Insecurity (03/14/2024)   Hunger Vital Sign    Worried About Running Out of Food in the Last Year: Never true    Ran Out of Food in the Last Year: Never true  Transportation Needs: No Transportation Needs (03/14/2024)   PRAPARE - Administrator, Civil Service (Medical): No    Lack of Transportation (Non-Medical): No  Physical Activity: Sufficiently Active (02/07/2023)   Received from [facility]   Exercise Vital Sign    On average, how many days per week do you engage in moderate to strenuous exercise (like a brisk walk)?: 5 days    On  average, how many minutes do you engage in exercise at this level?: 30 min  Stress: Patient Declined (02/07/2023)   Received from [facility] - Occupational Stress Questionnaire    Feeling of Stress : Patient declined  Social Connections: Moderately Integrated (03/14/2024)   Social Connection and Isolation Panel    Frequency of Communication with Friends and Family: Twice a week    Frequency of Social Gatherings with Friends and Family: Twice a week    Attends  Religious Services: 1 to 4 times per year    Active Member of [employer] or Organizations: Yes    Attends Banker Meetings: 1 to 4 times per year    Marital Status: Never married  Intimate Partner Violence: Not At Risk (03/14/2024)   Humiliation, Afraid, Rape, and Kick questionnaire    Fear of Current or Ex-Partner: No    Emotionally Abused: No    Physically Abused: No    Sexually Abused: No    Family History  Problem Relation Age of Onset   Neuropathy Mother    Hypertension Mother    Hyperlipidemia Mother    Thyroid  disease Mother    Prostate cancer Father    Lymphoma Father    Hypertension Father    Hyperlipidemia Father    Cancer Father    Obesity Father    Brain cancer Father    Congestive Heart Failure Maternal Grandmother    Lung cancer Maternal Grandfather    Stomach cancer Neg Hx    Colon cancer Neg Hx    Esophageal cancer Neg Hx    Pancreatic cancer Neg Hx     Past Medical History:  Diagnosis Date   Anemia    Arthritis    knees, hips, possibly back (04/08/2016)   Atrial septal aneurysm    B12 deficiency    a. following gastric bypass.   Back pain    Cancer of kidney (HCC)    Cataract    Chronic lower back pain    spondylosis   Cold feet    Daily headache    short ones for the last month or 2 (04/08/2016)   Depression    Diabetes (HCC)    Dry mouth    DVT (deep venous thrombosis) (HCC)    Easy bruising    Floaters in visual field    GERD (gastroesophageal reflux disease)    takes Protonix  daily just to protect my stomach; not for reflux (04/08/2016)   Hay fever    Heart murmur    dx'd by 1 anesthesiologist years and years ago   History of loop recorder 03/2016   Hyperlipidemia    Hypertension    Joint pain    Leg cramp    Macular degeneration    just watching   Neuropathy    Nosebleed    Palpitations    Peripheral edema    Peripheral neuropathy    not on any meds   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    Pneumonia 12/2017   Sleep apnea    no CPAP since weight loss after bariatric surgery '12 (04/08/2016)   Spondylosis    Stroke (HCC) 04/07/2016   a. 01/2016 and 03/2016. during admission had + LE DVT.   Swelling of extremity    TIA (transient ischemic attack)    Trouble in sleeping    Urinary incontinence    Weakness     Patient Active Problem List  Diagnosis Date Noted   Hyponatremia 03/13/2024   Dehydration 03/13/2024   Nausea & vomiting 03/13/2024   Subarachnoid hemorrhage (HCC) 03/13/2024   Intracranial hemorrhage (HCC) 03/13/2024   TBI (traumatic brain injury) (HCC) 03/10/2024   CKD stage 3a, GFR 45-59 ml/min (HCC) 02/24/2023   Right carpal tunnel syndrome 11/03/2022   Contracture of muscle, right ankle and foot 04/13/2022   Disease related peripheral neuropathy 04/13/2022   Eating disorder 11/06/2021   Diabetic polyneuropathy associated with type 2 diabetes mellitus (HCC) 02/05/2021   Bariatric surgery status 01/02/2021   Coronary artery disease 01/02/2021   Decreased estrogen level 01/02/2021   Glaucoma 01/02/2021   History of diabetes mellitus 01/02/2021   Long term (current) use of anticoagulants 01/02/2021   Obstructive sleep apnea 01/02/2021   Osteoarthritis 01/02/2021   Personal history of transient ischemic attack (TIA), and cerebral infarction without residual deficits 01/02/2021   Hyperlipidemia associated with type 2 diabetes mellitus (HCC) 04/10/2020   B12 deficiency 10/12/2019   Depression 08/24/2019   Encounter for counseling 07/11/2019   Peripheral edema 06/07/2019   Other insomnia 05/22/2019   Lower extremity edema 05/08/2019   Idiopathic peripheral neuropathy 05/08/2019   Intestinal malabsorption, unspecified 11/07/2018   Other hyperlipidemia 11/07/2018   Vitamin D  deficiency 11/07/2018   Other constipation 10/24/2018   Pituitary adenoma (HCC) 09/22/2018   Aortic valve calcification 10/19/2016   Heart murmur 10/06/2016   Atrial septal aneurysm     DVT of deep femoral vein (HCC) 04/10/2016   Cerebrovascular accident (CVA) due to embolism of cerebral artery (HCC)    Expressive aphasia 04/09/2016   Anemia 04/09/2016   Hypocalcemia 04/09/2016   Cerebral infarction (HCC) 04/08/2016   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 (HCC) 03/30/2016   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HCC), 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   Thalamic infarct, acute (HCC) 02/01/2016   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   Stroke (HCC) 01/31/2016   Stroke-like symptoms 01/31/2016   Hyperglycemia 01/31/2016   Diabetes mellitus (HCC)    Hypertension associated with type 2 diabetes mellitus (HCC)    Morbid obesity (HCC)     Past Surgical History:  Procedure Laterality Date   ABDOMINOPLASTY     BRACHIOPLASTY     and more tissue removed during a second surgery   CATARACT EXTRACTION W/ INTRAOCULAR LENS  IMPLANT, BILATERAL Bilateral ~ 2012   COLONOSCOPY     EP IMPLANTABLE DEVICE N/A 04/10/2016   Procedure: Loop Recorder Insertion;  Surgeon: Will Gladis Norton, MD;  Location: [facility] INVASIVE CV LAB;  Service: Cardiovascular;  Laterality: N/A;   ESOPHAGOGASTRODUODENOSCOPY     in the 70's   FACIAL COSMETIC SURGERY     JOINT REPLACEMENT     MASTOPEXY     NEPHRECTOMY  03/2021   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   SINUS ENDO W/FUSION  02/28/2016   Procedure: ENDOSCOPIC SINUS SURGERY WITH NAVIGATION;  Surgeon: Merilee Kraft, MD;  Location: [facility] OR;  Service: ENT;;   TEE WITHOUT CARDIOVERSION N/A 04/10/2016   Procedure: TRANSESOPHAGEAL ECHOCARDIOGRAM (TEE) POSSIBLE LOOP;  Surgeon: Jerel Balding, MD;  Location: [facility] ENDOSCOPY;  Service: Cardiovascular;  Laterality: N/A;   TONSILLECTOMY  1957   TOTAL HIP ARTHROPLASTY Right 2008   TOTAL HIP REVISION Right 03/30/2016   Procedure: RIGHT TOTAL HIP REVISION;  Surgeon: Dempsey Sensor, MD;  Location: [facility] OR;  Service: Orthopedics;  Laterality: Right;   TOTAL KNEE ARTHROPLASTY Bilateral 2001   VAGINAL HYSTERECTOMY     left my  ovaries    Current Outpatient  Medications  Medication Sig Dispense Refill   acetaminophen  (TYLENOL ) 325 MG tablet Take 2 tablets (650 mg total) by mouth every 6 (six) hours as needed for mild pain (pain score 1-3), fever, moderate pain (pain score 4-6) or headache (or Fever >/= 101).     ALPRAZolam  (XANAX ) 0.5 MG tablet Take 1 tablet (0.5 mg total) by mouth at bedtime as needed for sleep. 2 tablet 0   aspirin  EC 81 MG tablet Take 1 tablet (81 mg total) by mouth at bedtime. Swallow whole. 30 tablet 12   buPROPion  (WELLBUTRIN  XL) 300 MG 24 hr tablet Take 1 tablet (300 mg total) by mouth daily. (Patient taking differently: Take 300 mg by mouth in the morning.) 90 tablet 0   Calcium  Carbonate-Vit D-Min (CALCIUM  1200 PO) Take 1 capsule by mouth at bedtime.     capsaicin  topical system (QUTENZA , 4 PATCH,) 8 % FOR OFFICE ADMINISTRATION BY YOUR PHYSICIAN (Patient taking differently: Apply 1 patch topically as needed.) 1 kit 0   carvedilol  (COREG ) 12.5 MG tablet Take 12.5 mg by mouth 2 (two) times daily with a meal.     Cholecalciferol  (VITAMIN D3) 125 MCG (5000 UT) CAPS Take 1 capsule by mouth at bedtime.     doxylamine, Sleep, (UNISOM) 25 MG tablet Take 25 mg by mouth at bedtime as needed for sleep.     escitalopram  (LEXAPRO ) 5 MG tablet Take 1 tablet (5 mg total) by mouth daily. (Patient taking differently: Take 5 mg by mouth at bedtime.) 90 tablet 0   gabapentin  (NEURONTIN ) 300 MG capsule TAKE 2 CAPSULES 3 TIMES A  DAY (Patient taking differently: Take 600 mg by mouth 3 (three) times daily.) 540 capsule 3   lamoTRIgine  (LAMICTAL  XR) 100 MG 24 hour tablet Take 100 mg by mouth daily.     lamoTRIgine  (LAMICTAL  XR) 50 MG 24 hour tablet Please increase your Lamictal  dose from 100mg  to 150mg  a day by adding a 50mg  pill to your current 100mg  pill. In 2 weeks, if no side effects, may increase to 200mg  daily by adding two 50mg  pills to your current 100mg  pill. Stop for any side effects especially rash. (Patient  taking differently: Take 150 mg by mouth at bedtime.) 60 tablet 3   lidocaine -prilocaine  (EMLA ) cream APPLY TOPICALLY 1 APPLICATION AS NEEDED (Patient taking differently: Apply 1 Application topically as needed (foot pains).) 30 g 11   losartan  (COZAAR ) 25 MG tablet Take 0.5 tablets (12.5 mg total) by mouth daily. 30 tablet 11   Magnesium  Hydroxide (MILK OF MAGNESIA PO) Take 1 Dose by mouth as needed.     magnesium  oxide (MAG-OX) 400 MG tablet Take 400 mg by mouth in the morning.     MAGNESIUM  PO Take 1 capsule by mouth at bedtime. Magtein - Magnesium  L'threonate     Multiple Vitamins-Minerals (OCUVITE-LUTEIN PO) Take 1 tablet by mouth in the morning.     NONFORMULARY OR COMPOUNDED ITEM Compound Cream 7A (allow alternate if needed for insurance): Amantadine 8%, Baclofen 2%, Gabapentin  6%, Amitriptyline  4%, Bupivacaine  2%, Clonidine 0.2%, Nifedipine 2%.   Instructions: Apply 1-2 grams to the affected area 3-4 times daily. (Patient taking differently: Apply 1 Application topically as needed. Compound Cream 7A (allow alternate if needed for insurance): Amantadine 8%, Baclofen 2%, Gabapentin  6%, Amitriptyline  4%, Bupivacaine  2%, Clonidine 0.2%, Nifedipine 2%.   Instructions: Apply 1-2 grams to the affected area 3-4 times daily.) 1 each PRN   rosuvastatin  (CRESTOR ) 20 MG tablet Take 1 tablet (20 mg total) by mouth  at bedtime. 90 tablet 0   SUPER B COMPLEX/C PO Take 1 capsule by mouth in the morning.     tirzepatide  (MOUNJARO ) 15 MG/0.5ML Pen Inject 15 mg into the skin once a week. (Patient taking differently: Inject 15 mg into the skin once a week. On Friday) 2 mL 0   urea  (CARMOL) 10 % cream APPLY TO AFFECTED AREA EVERY DAY AS NEEDED (Patient taking differently: Apply 1 Application topically as needed.) 85 g 2   No current facility-administered medications for this visit.    Allergies as of 03/23/2024 - Review Complete 03/13/2024  Allergen Reaction Noted   Ambien [zolpidem] Other (See Comments)  03/27/2016   Atorvastatin  Other (See Comments) 02/24/2016   Codeine Nausea And Vomiting 05/29/2011   Penicillins Itching and Other (See Comments) 05/29/2011   Simvastatin Other (See Comments) 03/27/2016   Meclizine   07/06/2019   Meclizine  hcl  11/18/2020   Other Other (See Comments) 11/18/2020   Byetta 10 mcg pen [exenatide] Nausea Only 03/27/2016    Vitals: There were no vitals taken for this visit. Last Weight:  Wt Readings from Last 1 Encounters:  03/13/24 177 lb (80.3 kg)   Last Height:   Ht Readings from Last 1 Encounters:  03/13/24 5' 3 (1.6 m)     Physical exam: Exam: Gen: NAD, conversant      CV: No palpitations or chest pain or SOB. VS: Breathing at a normal rate. Not febrile. Eyes: Conjunctivae clear without exudates or hemorrhage  Neuro: Detailed Neurologic Exam  Speech:    Speech is normal; fluent and spontaneous with normal comprehension.  Cognition:    The patient is oriented to person, place, and time;     recent and remote memory intact;     language fluent;     normal attention, concentration, fund of knowledge Cranial Nerves:    The pupils are equal, round, and reactive to light. Visual fields are full Extraocular movements are intact.  The face is symmetric with normal sensation. The palate elevates in the midline. Hearing intact. Voice is normal. Shoulder shrug is normal. The tongue has normal motion without fasciculations.   Coordination: normal  Gait:    No abnormalities noted or reported  Motor Observation:   no involuntary movements noted. Tone:    Appears normal  Posture:    Posture is normal. normal erect    Strength:    Strength is anti-gravity and symmetric in the upper and lower limbs.      Sensation: intact to LT, no reports of numbness or tingling or paresthesias       Assessment/Plan:  recent head trauma, mechanical fall, SDH  Ongoing post traumatic symptoms such as headache Discussed with patient at length. Rest is  important in concussion recovery. Recommend shortened work days, working from home if she can, taking frequent breaks. No strenuous activity, limiting computer and reading time. Continue conservative treatment for muscular relief Rest, rest, rest May take upwards of 3-6 months to recover Ct head to follow bleeding  Orders Placed This Encounter  Procedures   CT HEAD WO CONTRAST ( )     Discussed the following:  To prevent or relieve headaches, try the following: Cool Compress. Lie down and place a cool compress on your head.  Avoid headache triggers. If certain foods or odors seem to have triggered your migraines in the past, avoid them. A headache diary might help you identify triggers.  Include physical activity in your daily routine. Try a daily walk or other  moderate aerobic exercise.  Manage stress. Find healthy ways to cope with the stressors, such as delegating tasks on your to-do list.  Practice relaxation techniques. Try deep breathing, yoga, massage and visualization.  Eat regularly. Eating regularly scheduled meals and maintaining a healthy diet might help prevent headaches. Also, drink plenty of fluids.  Follow a regular sleep schedule. Sleep deprivation might contribute to headaches Consider biofeedback. With this mind-body technique, you learn to control certain bodily functions -- such as muscle tension, heart rate and blood pressure -- to prevent headaches or reduce headache pain.    Proceed to emergency room if you experience new or worsening symptoms or symptoms do not resolve, if you have new neurologic symptoms or if headache is severe, or for any concerning symptom.    Orders Placed This Encounter  Procedures   CT HEAD WO CONTRAST ( )   No orders of the defined types were placed in this encounter.   Cc: Claudene Lacks, MD,  Claudene Lacks, MD  [name] Epp, MD  [facility] [address] [place], [place] [address]  Phone  [phone] Fax [phone]

## 2024-03-23 NOTE — Telephone Encounter (Signed)
 no auth required sent to GI [phone]

## 2024-03-24 DIAGNOSIS — R2681 Unsteadiness on feet: Secondary | ICD-10-CM | POA: Diagnosis not present

## 2024-03-24 DIAGNOSIS — M6281 Muscle weakness (generalized): Secondary | ICD-10-CM | POA: Diagnosis not present

## 2024-03-26 ENCOUNTER — Encounter: Payer: Self-pay | Admitting: Neurology

## 2024-03-27 DIAGNOSIS — M6281 Muscle weakness (generalized): Secondary | ICD-10-CM | POA: Diagnosis not present

## 2024-03-27 DIAGNOSIS — R2681 Unsteadiness on feet: Secondary | ICD-10-CM | POA: Diagnosis not present

## 2024-03-28 ENCOUNTER — Ambulatory Visit
Admission: RE | Admit: 2024-03-28 | Discharge: 2024-03-28 | Disposition: A | Discharge status: Home / Self Care | Source: Ambulatory Visit | Attending: Neurology | Admitting: Neurology

## 2024-03-28 DIAGNOSIS — R419 Unspecified symptoms and signs involving cognitive functions and awareness: Secondary | ICD-10-CM | POA: Diagnosis not present

## 2024-03-28 DIAGNOSIS — R519 Headache, unspecified: Secondary | ICD-10-CM

## 2024-03-28 DIAGNOSIS — I6782 Cerebral ischemia: Secondary | ICD-10-CM | POA: Diagnosis not present

## 2024-03-28 DIAGNOSIS — S065XAA Traumatic subdural hemorrhage with loss of consciousness status unknown, initial encounter: Secondary | ICD-10-CM

## 2024-03-28 DIAGNOSIS — G9389 Other specified disorders of brain: Secondary | ICD-10-CM | POA: Diagnosis not present

## 2024-03-28 DIAGNOSIS — S069X1A Unspecified intracranial injury with loss of consciousness of 30 minutes or less, initial encounter: Secondary | ICD-10-CM

## 2024-03-28 DIAGNOSIS — H538 Other visual disturbances: Secondary | ICD-10-CM

## 2024-03-29 ENCOUNTER — Ambulatory Visit: Payer: Self-pay | Admitting: Neurology

## 2024-03-30 DIAGNOSIS — I10 Essential (primary) hypertension: Secondary | ICD-10-CM | POA: Diagnosis not present

## 2024-03-30 DIAGNOSIS — R2681 Unsteadiness on feet: Secondary | ICD-10-CM | POA: Diagnosis not present

## 2024-03-30 DIAGNOSIS — Z8679 Personal history of other diseases of the circulatory system: Secondary | ICD-10-CM | POA: Diagnosis not present

## 2024-03-30 DIAGNOSIS — Z9181 History of falling: Secondary | ICD-10-CM | POA: Diagnosis not present

## 2024-03-30 DIAGNOSIS — E871 Hypo-osmolality and hyponatremia: Secondary | ICD-10-CM | POA: Diagnosis not present

## 2024-03-30 DIAGNOSIS — M6281 Muscle weakness (generalized): Secondary | ICD-10-CM | POA: Diagnosis not present

## 2024-04-04 DIAGNOSIS — M6281 Muscle weakness (generalized): Secondary | ICD-10-CM | POA: Diagnosis not present

## 2024-04-04 DIAGNOSIS — R2681 Unsteadiness on feet: Secondary | ICD-10-CM | POA: Diagnosis not present

## 2024-04-05 DIAGNOSIS — Z6829 Body mass index (BMI) 29.0-29.9, adult: Secondary | ICD-10-CM | POA: Diagnosis not present

## 2024-04-05 DIAGNOSIS — Z8639 Personal history of other endocrine, nutritional and metabolic disease: Secondary | ICD-10-CM | POA: Diagnosis not present

## 2024-04-05 DIAGNOSIS — E1140 Type 2 diabetes mellitus with diabetic neuropathy, unspecified: Secondary | ICD-10-CM | POA: Diagnosis not present

## 2024-04-05 DIAGNOSIS — Z9884 Bariatric surgery status: Secondary | ICD-10-CM | POA: Diagnosis not present

## 2024-04-05 DIAGNOSIS — N1831 Chronic kidney disease, stage 3a: Secondary | ICD-10-CM | POA: Diagnosis not present

## 2024-04-05 DIAGNOSIS — F3289 Other specified depressive episodes: Secondary | ICD-10-CM | POA: Diagnosis not present

## 2024-04-05 DIAGNOSIS — E663 Overweight: Secondary | ICD-10-CM | POA: Diagnosis not present

## 2024-04-05 DIAGNOSIS — I152 Hypertension secondary to endocrine disorders: Secondary | ICD-10-CM | POA: Diagnosis not present

## 2024-04-07 DIAGNOSIS — M6281 Muscle weakness (generalized): Secondary | ICD-10-CM | POA: Diagnosis not present

## 2024-04-07 DIAGNOSIS — R2681 Unsteadiness on feet: Secondary | ICD-10-CM | POA: Diagnosis not present

## 2024-04-18 DIAGNOSIS — M6281 Muscle weakness (generalized): Secondary | ICD-10-CM | POA: Diagnosis not present

## 2024-04-18 DIAGNOSIS — R2681 Unsteadiness on feet: Secondary | ICD-10-CM | POA: Diagnosis not present

## 2024-04-19 DIAGNOSIS — E871 Hypo-osmolality and hyponatremia: Secondary | ICD-10-CM | POA: Diagnosis not present

## 2024-04-20 DIAGNOSIS — F3289 Other specified depressive episodes: Secondary | ICD-10-CM | POA: Diagnosis not present

## 2024-04-20 DIAGNOSIS — F320 Major depressive disorder, single episode, mild: Secondary | ICD-10-CM | POA: Diagnosis not present

## 2024-04-20 DIAGNOSIS — E6609 Other obesity due to excess calories: Secondary | ICD-10-CM | POA: Diagnosis not present

## 2024-04-20 DIAGNOSIS — E1140 Type 2 diabetes mellitus with diabetic neuropathy, unspecified: Secondary | ICD-10-CM | POA: Diagnosis not present

## 2024-04-21 DIAGNOSIS — R2681 Unsteadiness on feet: Secondary | ICD-10-CM | POA: Diagnosis not present

## 2024-04-21 DIAGNOSIS — M6281 Muscle weakness (generalized): Secondary | ICD-10-CM | POA: Diagnosis not present

## 2024-04-25 DIAGNOSIS — C641 Malignant neoplasm of right kidney, except renal pelvis: Secondary | ICD-10-CM | POA: Diagnosis not present

## 2024-04-25 DIAGNOSIS — E66811 Obesity, class 1: Secondary | ICD-10-CM | POA: Diagnosis not present

## 2024-04-25 DIAGNOSIS — E871 Hypo-osmolality and hyponatremia: Secondary | ICD-10-CM | POA: Diagnosis not present

## 2024-04-25 DIAGNOSIS — Z905 Acquired absence of kidney: Secondary | ICD-10-CM | POA: Diagnosis not present

## 2024-04-25 DIAGNOSIS — N1831 Chronic kidney disease, stage 3a: Secondary | ICD-10-CM | POA: Diagnosis not present

## 2024-04-25 DIAGNOSIS — R600 Localized edema: Secondary | ICD-10-CM | POA: Diagnosis not present

## 2024-05-01 ENCOUNTER — Other Ambulatory Visit: Payer: Self-pay | Admitting: Neurology

## 2024-05-01 DIAGNOSIS — G6289 Other specified polyneuropathies: Secondary | ICD-10-CM

## 2024-05-15 DIAGNOSIS — G5603 Carpal tunnel syndrome, bilateral upper limbs: Secondary | ICD-10-CM | POA: Diagnosis not present

## 2024-05-15 DIAGNOSIS — M4722 Other spondylosis with radiculopathy, cervical region: Secondary | ICD-10-CM | POA: Diagnosis not present

## 2024-05-16 ENCOUNTER — Ambulatory Visit (INDEPENDENT_AMBULATORY_CARE_PROVIDER_SITE_OTHER): Admitting: Neurology

## 2024-05-16 VITALS — BP 128/73 | HR 69

## 2024-05-16 DIAGNOSIS — F3289 Other specified depressive episodes: Secondary | ICD-10-CM | POA: Diagnosis not present

## 2024-05-16 DIAGNOSIS — E871 Hypo-osmolality and hyponatremia: Secondary | ICD-10-CM | POA: Diagnosis not present

## 2024-05-16 DIAGNOSIS — Z6829 Body mass index (BMI) 29.0-29.9, adult: Secondary | ICD-10-CM | POA: Diagnosis not present

## 2024-05-16 DIAGNOSIS — E663 Overweight: Secondary | ICD-10-CM | POA: Diagnosis not present

## 2024-05-16 DIAGNOSIS — Z8639 Personal history of other endocrine, nutritional and metabolic disease: Secondary | ICD-10-CM | POA: Diagnosis not present

## 2024-05-16 DIAGNOSIS — M62472 Contracture of muscle, left ankle and foot: Secondary | ICD-10-CM | POA: Diagnosis not present

## 2024-05-16 DIAGNOSIS — N1831 Chronic kidney disease, stage 3a: Secondary | ICD-10-CM | POA: Diagnosis not present

## 2024-05-16 DIAGNOSIS — Z9884 Bariatric surgery status: Secondary | ICD-10-CM | POA: Diagnosis not present

## 2024-05-16 DIAGNOSIS — I152 Hypertension secondary to endocrine disorders: Secondary | ICD-10-CM | POA: Diagnosis not present

## 2024-05-16 DIAGNOSIS — E1140 Type 2 diabetes mellitus with diabetic neuropathy, unspecified: Secondary | ICD-10-CM

## 2024-05-16 MED ORDER — TRAMADOL HCL 50 MG PO TABS: 50.0000 mg | ORAL_TABLET | Freq: Four times a day (QID) | ORAL | 3 refills | Status: AC | PRN

## 2024-05-16 MED ORDER — ONABOTULINUMTOXINA 100 UNITS IJ SOLR
300.0000 [IU] | Freq: Once | INTRAMUSCULAR | Status: AC
  Administered 2024-05-16: 300 [IU] via INTRAMUSCULAR

## 2024-05-16 NOTE — Progress Notes (Signed)
 05/16/2024: Increase in Lamictal  and increase in botox  helped the pain in the feet. Refill tramaol. Went to [facility] [place] for braces. Her friend donald works there. 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 Hx of diabetic(per-diabetic) neuropathy.    Procedure: All procedures documented were medically necessary, reasonable and appropriate based on the patient's history, medical diagnosis and physician opinion. Verbal informed consent was obtained from the patient, patient was informed of potential risk of procedure, including bruising, bleeding, hematoma formation, infection, muscle weakness, muscle pain, numbness, transient hypertension, transient hyperglycemia and transient insomnia among others. All areas injected were topically clean with isopropyl rubbing alcohol. Nonsterile nonlatex gloves were worn during the procedure.    Injections were given unilateral to the left plantar surface of the foot medial surface ask her to point out the area of pain and the extensor digitorum brevis muscle and Tibials posterior. 300 units of Botox  were used and none was wasted.   Dx: F37.527  (In the future if we continue: [phone] CPT code and no additional limb today but [phone] for additional limb)  PRIOR:  02/21/2024: botox  improvement > 50% in severity of neuropathy, 300 units  Refill tramadol  and xanax  for pain discussed using together can cause sedation/resp depression do not use together or on the same day, use sparingly and only as needed  Lamictal  not helping for pain, severe neuropathic. She had better luck with tegretol. Discussed, stop lamictal  and sart tegretol. Will mychart and ask her for history do  not see prior prescription.  06/29/2023: botox  improvement > 50% in severity of neuropathy, 200 units  04/07/2023; 200 Units. Also will try qulipta. 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   01/11/2023:, used 200 U, will let us  know on how she responds  06/30/2022: NEXT TIME MIX 200 units for the left foot  04/07/2022: just did left foot 100 units, >> 50% improvement in pain and range of motion 01/13/2022: improved 50% we will see what happens with this injection 11/19/2020: first injections     04/07/2023: Patient and I also talked about CPRS, the very unusual place for diabetic neuropathy, she also has knee pain could be peroneal neuropathy, I explained what CPRS is and we showed some pictures online and also what peroneal neuropathy is and showed her images online.  We decided to try Qutenza  for the pain, we discussed sympathetic nerve blocks if it is CPRS, and we decided to order an EMG nerve conduction study on her lower extremities.  This was all in addition to above procedure.

## 2024-05-16 NOTE — Progress Notes (Signed)
 Botox - 100 units x 3 vials Lot: [id number]  Expiration: 07/2026 NDC: 9976-8854-98  Bacteriostatic 0.9% Sodium Chloride - 3 mL  Lot: [id number] Expiration: 06/2025 NDC: 9590-8066-97  Dx: F37.527   B/B Witnessed by Particia PEAK

## 2024-05-17 ENCOUNTER — Other Ambulatory Visit: Payer: Self-pay | Admitting: Neurology

## 2024-05-17 DIAGNOSIS — E785 Hyperlipidemia, unspecified: Secondary | ICD-10-CM | POA: Diagnosis not present

## 2024-05-17 DIAGNOSIS — C641 Malignant neoplasm of right kidney, except renal pelvis: Secondary | ICD-10-CM | POA: Diagnosis not present

## 2024-05-17 DIAGNOSIS — R9431 Abnormal electrocardiogram [ECG] [EKG]: Secondary | ICD-10-CM | POA: Diagnosis not present

## 2024-05-17 DIAGNOSIS — I350 Nonrheumatic aortic (valve) stenosis: Secondary | ICD-10-CM | POA: Diagnosis not present

## 2024-05-17 DIAGNOSIS — R55 Syncope and collapse: Secondary | ICD-10-CM | POA: Diagnosis not present

## 2024-05-17 DIAGNOSIS — Z8673 Personal history of transient ischemic attack (TIA), and cerebral infarction without residual deficits: Secondary | ICD-10-CM | POA: Diagnosis not present

## 2024-05-17 DIAGNOSIS — R079 Chest pain, unspecified: Secondary | ICD-10-CM | POA: Diagnosis not present

## 2024-05-17 DIAGNOSIS — I4729 Other ventricular tachycardia: Secondary | ICD-10-CM | POA: Diagnosis not present

## 2024-05-17 DIAGNOSIS — I10 Essential (primary) hypertension: Secondary | ICD-10-CM | POA: Diagnosis not present

## 2024-05-18 ENCOUNTER — Other Ambulatory Visit: Payer: Self-pay

## 2024-05-18 DIAGNOSIS — E785 Hyperlipidemia, unspecified: Secondary | ICD-10-CM | POA: Diagnosis not present

## 2024-05-18 DIAGNOSIS — R079 Chest pain, unspecified: Secondary | ICD-10-CM | POA: Diagnosis not present

## 2024-05-18 DIAGNOSIS — I350 Nonrheumatic aortic (valve) stenosis: Secondary | ICD-10-CM | POA: Diagnosis not present

## 2024-05-18 DIAGNOSIS — Z8673 Personal history of transient ischemic attack (TIA), and cerebral infarction without residual deficits: Secondary | ICD-10-CM | POA: Diagnosis not present

## 2024-05-18 DIAGNOSIS — I4729 Other ventricular tachycardia: Secondary | ICD-10-CM | POA: Diagnosis not present

## 2024-05-18 DIAGNOSIS — I10 Essential (primary) hypertension: Secondary | ICD-10-CM | POA: Diagnosis not present

## 2024-05-18 DIAGNOSIS — R9431 Abnormal electrocardiogram [ECG] [EKG]: Secondary | ICD-10-CM | POA: Diagnosis not present

## 2024-05-18 DIAGNOSIS — R55 Syncope and collapse: Secondary | ICD-10-CM | POA: Diagnosis not present

## 2024-05-19 DIAGNOSIS — C641 Malignant neoplasm of right kidney, except renal pelvis: Secondary | ICD-10-CM | POA: Diagnosis not present

## 2024-05-21 DIAGNOSIS — F320 Major depressive disorder, single episode, mild: Secondary | ICD-10-CM | POA: Diagnosis not present

## 2024-05-21 DIAGNOSIS — E1140 Type 2 diabetes mellitus with diabetic neuropathy, unspecified: Secondary | ICD-10-CM | POA: Diagnosis not present

## 2024-05-21 DIAGNOSIS — E6609 Other obesity due to excess calories: Secondary | ICD-10-CM | POA: Diagnosis not present

## 2024-05-21 DIAGNOSIS — F3289 Other specified depressive episodes: Secondary | ICD-10-CM | POA: Diagnosis not present

## 2024-05-25 DIAGNOSIS — E1140 Type 2 diabetes mellitus with diabetic neuropathy, unspecified: Secondary | ICD-10-CM | POA: Diagnosis not present

## 2024-05-25 DIAGNOSIS — E66811 Obesity, class 1: Secondary | ICD-10-CM | POA: Diagnosis not present

## 2024-05-25 DIAGNOSIS — I152 Hypertension secondary to endocrine disorders: Secondary | ICD-10-CM | POA: Diagnosis not present

## 2024-05-25 DIAGNOSIS — Z9884 Bariatric surgery status: Secondary | ICD-10-CM | POA: Diagnosis not present

## 2024-05-25 DIAGNOSIS — E6609 Other obesity due to excess calories: Secondary | ICD-10-CM | POA: Diagnosis not present

## 2024-05-25 DIAGNOSIS — Z6830 Body mass index (BMI) 30.0-30.9, adult: Secondary | ICD-10-CM | POA: Diagnosis not present

## 2024-05-25 DIAGNOSIS — N1831 Chronic kidney disease, stage 3a: Secondary | ICD-10-CM | POA: Diagnosis not present

## 2024-05-25 DIAGNOSIS — E871 Hypo-osmolality and hyponatremia: Secondary | ICD-10-CM | POA: Diagnosis not present

## 2024-05-25 DIAGNOSIS — F3289 Other specified depressive episodes: Secondary | ICD-10-CM | POA: Diagnosis not present

## 2024-05-26 DIAGNOSIS — Z03818 Encounter for observation for suspected exposure to other biological agents ruled out: Secondary | ICD-10-CM | POA: Diagnosis not present

## 2024-05-26 DIAGNOSIS — R051 Acute cough: Secondary | ICD-10-CM | POA: Diagnosis not present

## 2024-05-26 DIAGNOSIS — R0981 Nasal congestion: Secondary | ICD-10-CM | POA: Diagnosis not present

## 2024-05-29 DIAGNOSIS — D352 Benign neoplasm of pituitary gland: Secondary | ICD-10-CM | POA: Diagnosis not present

## 2024-05-29 DIAGNOSIS — F0781 Postconcussional syndrome: Secondary | ICD-10-CM | POA: Diagnosis not present

## 2024-05-29 DIAGNOSIS — Z6831 Body mass index (BMI) 31.0-31.9, adult: Secondary | ICD-10-CM | POA: Diagnosis not present

## 2024-06-05 DIAGNOSIS — R9431 Abnormal electrocardiogram [ECG] [EKG]: Secondary | ICD-10-CM | POA: Diagnosis not present

## 2024-06-05 DIAGNOSIS — R55 Syncope and collapse: Secondary | ICD-10-CM | POA: Diagnosis not present

## 2024-06-05 DIAGNOSIS — I10 Essential (primary) hypertension: Secondary | ICD-10-CM | POA: Diagnosis not present

## 2024-06-05 DIAGNOSIS — R079 Chest pain, unspecified: Secondary | ICD-10-CM | POA: Diagnosis not present

## 2024-06-05 DIAGNOSIS — E785 Hyperlipidemia, unspecified: Secondary | ICD-10-CM | POA: Diagnosis not present

## 2024-06-06 ENCOUNTER — Other Ambulatory Visit: Payer: Self-pay | Admitting: Medical Genetics

## 2024-06-06 DIAGNOSIS — I350 Nonrheumatic aortic (valve) stenosis: Secondary | ICD-10-CM | POA: Diagnosis not present

## 2024-06-06 DIAGNOSIS — E785 Hyperlipidemia, unspecified: Secondary | ICD-10-CM | POA: Diagnosis not present

## 2024-06-06 DIAGNOSIS — R9431 Abnormal electrocardiogram [ECG] [EKG]: Secondary | ICD-10-CM | POA: Diagnosis not present

## 2024-06-06 DIAGNOSIS — R55 Syncope and collapse: Secondary | ICD-10-CM | POA: Diagnosis not present

## 2024-06-06 DIAGNOSIS — I10 Essential (primary) hypertension: Secondary | ICD-10-CM | POA: Diagnosis not present

## 2024-06-06 DIAGNOSIS — Z8673 Personal history of transient ischemic attack (TIA), and cerebral infarction without residual deficits: Secondary | ICD-10-CM | POA: Diagnosis not present

## 2024-06-06 DIAGNOSIS — I4729 Other ventricular tachycardia: Secondary | ICD-10-CM | POA: Diagnosis not present

## 2024-06-06 DIAGNOSIS — R079 Chest pain, unspecified: Secondary | ICD-10-CM | POA: Diagnosis not present

## 2024-06-12 DIAGNOSIS — Z6829 Body mass index (BMI) 29.0-29.9, adult: Secondary | ICD-10-CM | POA: Diagnosis not present

## 2024-06-12 DIAGNOSIS — E6609 Other obesity due to excess calories: Secondary | ICD-10-CM | POA: Diagnosis not present

## 2024-06-12 DIAGNOSIS — N1831 Chronic kidney disease, stage 3a: Secondary | ICD-10-CM | POA: Diagnosis not present

## 2024-06-12 DIAGNOSIS — E1140 Type 2 diabetes mellitus with diabetic neuropathy, unspecified: Secondary | ICD-10-CM | POA: Diagnosis not present

## 2024-06-12 DIAGNOSIS — Z9884 Bariatric surgery status: Secondary | ICD-10-CM | POA: Diagnosis not present

## 2024-06-12 DIAGNOSIS — I152 Hypertension secondary to endocrine disorders: Secondary | ICD-10-CM | POA: Diagnosis not present

## 2024-06-12 DIAGNOSIS — E663 Overweight: Secondary | ICD-10-CM | POA: Diagnosis not present

## 2024-06-12 DIAGNOSIS — E871 Hypo-osmolality and hyponatremia: Secondary | ICD-10-CM | POA: Diagnosis not present

## 2024-06-12 DIAGNOSIS — Z8639 Personal history of other endocrine, nutritional and metabolic disease: Secondary | ICD-10-CM | POA: Diagnosis not present

## 2024-06-12 DIAGNOSIS — F3289 Other specified depressive episodes: Secondary | ICD-10-CM | POA: Diagnosis not present

## 2024-06-13 DIAGNOSIS — M4802 Spinal stenosis, cervical region: Secondary | ICD-10-CM | POA: Diagnosis not present

## 2024-06-13 DIAGNOSIS — J208 Acute bronchitis due to other specified organisms: Secondary | ICD-10-CM | POA: Diagnosis not present

## 2024-06-13 DIAGNOSIS — G5603 Carpal tunnel syndrome, bilateral upper limbs: Secondary | ICD-10-CM | POA: Diagnosis not present

## 2024-06-14 ENCOUNTER — Encounter: Payer: Self-pay | Admitting: Neurology

## 2024-06-14 NOTE — Telephone Encounter (Signed)
 Spoke to patient made her aware that Dr Ines is out the office this week and hopefully will return next week  Made patient aware  if she is having neck pain please call PCP, go to ED or Urgent  care to be evaluated Pt states already been to PCP and she doesn't hurt that much but has discussed in past . Dr Ines sees patient for foot neuropathy. Pt states will call back next week

## 2024-06-20 DIAGNOSIS — F320 Major depressive disorder, single episode, mild: Secondary | ICD-10-CM | POA: Diagnosis not present

## 2024-06-20 DIAGNOSIS — F3289 Other specified depressive episodes: Secondary | ICD-10-CM | POA: Diagnosis not present

## 2024-06-20 DIAGNOSIS — E1140 Type 2 diabetes mellitus with diabetic neuropathy, unspecified: Secondary | ICD-10-CM | POA: Diagnosis not present

## 2024-06-20 DIAGNOSIS — E6609 Other obesity due to excess calories: Secondary | ICD-10-CM | POA: Diagnosis not present

## 2024-06-21 ENCOUNTER — Other Ambulatory Visit

## 2024-06-22 DIAGNOSIS — I152 Hypertension secondary to endocrine disorders: Secondary | ICD-10-CM | POA: Diagnosis not present

## 2024-06-22 DIAGNOSIS — F3289 Other specified depressive episodes: Secondary | ICD-10-CM | POA: Diagnosis not present

## 2024-06-22 DIAGNOSIS — E871 Hypo-osmolality and hyponatremia: Secondary | ICD-10-CM | POA: Diagnosis not present

## 2024-06-22 DIAGNOSIS — Z6830 Body mass index (BMI) 30.0-30.9, adult: Secondary | ICD-10-CM | POA: Diagnosis not present

## 2024-06-22 DIAGNOSIS — E66811 Obesity, class 1: Secondary | ICD-10-CM | POA: Diagnosis not present

## 2024-06-22 DIAGNOSIS — E559 Vitamin D deficiency, unspecified: Secondary | ICD-10-CM | POA: Diagnosis not present

## 2024-06-22 DIAGNOSIS — D508 Other iron deficiency anemias: Secondary | ICD-10-CM | POA: Diagnosis not present

## 2024-06-22 DIAGNOSIS — N1831 Chronic kidney disease, stage 3a: Secondary | ICD-10-CM | POA: Diagnosis not present

## 2024-06-22 DIAGNOSIS — E1140 Type 2 diabetes mellitus with diabetic neuropathy, unspecified: Secondary | ICD-10-CM | POA: Diagnosis not present

## 2024-06-22 DIAGNOSIS — E6609 Other obesity due to excess calories: Secondary | ICD-10-CM | POA: Diagnosis not present

## 2024-06-22 DIAGNOSIS — Z9884 Bariatric surgery status: Secondary | ICD-10-CM | POA: Diagnosis not present

## 2024-06-27 ENCOUNTER — Other Ambulatory Visit: Payer: Self-pay | Admitting: *Deleted

## 2024-06-28 ENCOUNTER — Telehealth: Payer: Self-pay | Admitting: Neurology

## 2024-06-28 DIAGNOSIS — E1142 Type 2 diabetes mellitus with diabetic polyneuropathy: Secondary | ICD-10-CM

## 2024-06-28 NOTE — Telephone Encounter (Signed)
.   Orders Placed This Encounter  Procedures   Ambulatory referral to Physical Medicine Rehab   EDUARD FABIENE HANLON, MD 06/28/2024, 3:33 PM Certified in Neurology, Neurophysiology and Neuroimaging  [facility] [address] [place], [place] [address] [phone]

## 2024-06-28 NOTE — Telephone Encounter (Signed)
 Pt had been receiving Botox  in her feet for neuropathy from Dr. Ines. Could you please place referral to [facility]? We've referred a previous patient there for this as well. Thank you!  Called pt and made her aware as well.

## 2024-06-28 NOTE — Addendum Note (Signed)
 Addended by: [name] CARNE R on: 06/28/2024 03:33 PM   Modules accepted: Orders

## 2024-06-28 NOTE — Telephone Encounter (Signed)
 Referral sent to [facility] via Epic.

## 2024-06-29 ENCOUNTER — Other Ambulatory Visit: Payer: Self-pay

## 2024-06-29 MED ORDER — LAMOTRIGINE ER 50 MG PO TB24: ORAL_TABLET | ORAL | 3 refills | Status: AC

## 2024-06-29 NOTE — Telephone Encounter (Signed)
 This encounter was created in error - please disregard.

## 2024-07-17 NOTE — Telephone Encounter (Signed)
 [facility] does not administer Botox  in the feet. Is there somewhere else she can be referred?

## 2024-07-17 NOTE — Telephone Encounter (Signed)
 Dr. Onita had told me for another pt with the same diagnosis to have them as PCP for a referral to pain management. Is this ok?

## 2024-07-17 NOTE — Telephone Encounter (Signed)
     Pt  called to Follow up on botox  referral  wanted to know if she will be referred out to different clinic for botox

## 2024-07-17 NOTE — Telephone Encounter (Signed)
 I'm unaware of anywhere else

## 2024-07-18 NOTE — Telephone Encounter (Signed)
 Ok to refer to pain management, may also [facility]

## 2024-07-21 DIAGNOSIS — F320 Major depressive disorder, single episode, mild: Secondary | ICD-10-CM | POA: Diagnosis not present

## 2024-07-21 DIAGNOSIS — F3289 Other specified depressive episodes: Secondary | ICD-10-CM | POA: Diagnosis not present

## 2024-07-21 DIAGNOSIS — E1140 Type 2 diabetes mellitus with diabetic neuropathy, unspecified: Secondary | ICD-10-CM | POA: Diagnosis not present

## 2024-07-21 DIAGNOSIS — E6609 Other obesity due to excess calories: Secondary | ICD-10-CM | POA: Diagnosis not present

## 2024-08-02 DIAGNOSIS — Z905 Acquired absence of kidney: Secondary | ICD-10-CM | POA: Diagnosis not present

## 2024-08-02 DIAGNOSIS — F419 Anxiety disorder, unspecified: Secondary | ICD-10-CM | POA: Diagnosis not present

## 2024-08-02 DIAGNOSIS — Z0000 Encounter for general adult medical examination without abnormal findings: Secondary | ICD-10-CM | POA: Diagnosis not present

## 2024-08-02 DIAGNOSIS — Z1231 Encounter for screening mammogram for malignant neoplasm of breast: Secondary | ICD-10-CM | POA: Diagnosis not present

## 2024-08-02 DIAGNOSIS — N1831 Chronic kidney disease, stage 3a: Secondary | ICD-10-CM | POA: Diagnosis not present

## 2024-08-02 DIAGNOSIS — G629 Polyneuropathy, unspecified: Secondary | ICD-10-CM | POA: Diagnosis not present

## 2024-08-02 DIAGNOSIS — G4700 Insomnia, unspecified: Secondary | ICD-10-CM | POA: Diagnosis not present

## 2024-08-07 DIAGNOSIS — E663 Overweight: Secondary | ICD-10-CM | POA: Diagnosis not present

## 2024-08-07 DIAGNOSIS — E871 Hypo-osmolality and hyponatremia: Secondary | ICD-10-CM | POA: Diagnosis not present

## 2024-08-07 DIAGNOSIS — D508 Other iron deficiency anemias: Secondary | ICD-10-CM | POA: Diagnosis not present

## 2024-08-07 DIAGNOSIS — I152 Hypertension secondary to endocrine disorders: Secondary | ICD-10-CM | POA: Diagnosis not present

## 2024-08-07 DIAGNOSIS — F3289 Other specified depressive episodes: Secondary | ICD-10-CM | POA: Diagnosis not present

## 2024-08-07 DIAGNOSIS — Z8639 Personal history of other endocrine, nutritional and metabolic disease: Secondary | ICD-10-CM | POA: Diagnosis not present

## 2024-08-07 DIAGNOSIS — Z6829 Body mass index (BMI) 29.0-29.9, adult: Secondary | ICD-10-CM | POA: Diagnosis not present

## 2024-08-07 DIAGNOSIS — E559 Vitamin D deficiency, unspecified: Secondary | ICD-10-CM | POA: Diagnosis not present

## 2024-08-07 DIAGNOSIS — E1140 Type 2 diabetes mellitus with diabetic neuropathy, unspecified: Secondary | ICD-10-CM | POA: Diagnosis not present

## 2024-08-07 DIAGNOSIS — N1831 Chronic kidney disease, stage 3a: Secondary | ICD-10-CM | POA: Diagnosis not present

## 2024-08-07 DIAGNOSIS — Z9884 Bariatric surgery status: Secondary | ICD-10-CM | POA: Diagnosis not present

## 2024-08-08 ENCOUNTER — Ambulatory Visit: Admitting: Neurology

## 2024-08-20 DIAGNOSIS — F320 Major depressive disorder, single episode, mild: Secondary | ICD-10-CM | POA: Diagnosis not present

## 2024-08-20 DIAGNOSIS — E1140 Type 2 diabetes mellitus with diabetic neuropathy, unspecified: Secondary | ICD-10-CM | POA: Diagnosis not present

## 2024-08-20 DIAGNOSIS — E6609 Other obesity due to excess calories: Secondary | ICD-10-CM | POA: Diagnosis not present

## 2024-08-20 DIAGNOSIS — F3289 Other specified depressive episodes: Secondary | ICD-10-CM | POA: Diagnosis not present

## 2024-08-21 DIAGNOSIS — G5601 Carpal tunnel syndrome, right upper limb: Secondary | ICD-10-CM | POA: Diagnosis not present

## 2024-08-21 DIAGNOSIS — Z6830 Body mass index (BMI) 30.0-30.9, adult: Secondary | ICD-10-CM | POA: Diagnosis not present

## 2024-08-21 DIAGNOSIS — M4802 Spinal stenosis, cervical region: Secondary | ICD-10-CM | POA: Diagnosis not present

## 2024-08-29 DIAGNOSIS — E871 Hypo-osmolality and hyponatremia: Secondary | ICD-10-CM | POA: Diagnosis not present

## 2024-08-29 DIAGNOSIS — E66811 Obesity, class 1: Secondary | ICD-10-CM | POA: Diagnosis not present

## 2024-08-29 DIAGNOSIS — E6609 Other obesity due to excess calories: Secondary | ICD-10-CM | POA: Diagnosis not present

## 2024-08-29 DIAGNOSIS — E559 Vitamin D deficiency, unspecified: Secondary | ICD-10-CM | POA: Diagnosis not present

## 2024-08-29 DIAGNOSIS — D508 Other iron deficiency anemias: Secondary | ICD-10-CM | POA: Diagnosis not present

## 2024-08-29 DIAGNOSIS — Z6830 Body mass index (BMI) 30.0-30.9, adult: Secondary | ICD-10-CM | POA: Diagnosis not present

## 2024-08-29 DIAGNOSIS — Z9884 Bariatric surgery status: Secondary | ICD-10-CM | POA: Diagnosis not present

## 2024-08-29 DIAGNOSIS — F3289 Other specified depressive episodes: Secondary | ICD-10-CM | POA: Diagnosis not present

## 2024-08-29 DIAGNOSIS — I152 Hypertension secondary to endocrine disorders: Secondary | ICD-10-CM | POA: Diagnosis not present

## 2024-08-29 DIAGNOSIS — E1140 Type 2 diabetes mellitus with diabetic neuropathy, unspecified: Secondary | ICD-10-CM | POA: Diagnosis not present

## 2024-08-29 DIAGNOSIS — N1831 Chronic kidney disease, stage 3a: Secondary | ICD-10-CM | POA: Diagnosis not present

## 2024-09-01 DIAGNOSIS — M79672 Pain in left foot: Secondary | ICD-10-CM | POA: Diagnosis not present

## 2024-09-01 DIAGNOSIS — M792 Neuralgia and neuritis, unspecified: Secondary | ICD-10-CM | POA: Diagnosis not present
